# Patient Record
Sex: Female | Born: 1945
Health system: Southern US, Community
[De-identification: ages and names within clinical notes are randomized; demographics above are authoritative.]

## PROBLEM LIST (undated history)

## (undated) DIAGNOSIS — M1990 Unspecified osteoarthritis, unspecified site: Secondary | ICD-10-CM

## (undated) DIAGNOSIS — E669 Obesity, unspecified: Secondary | ICD-10-CM

## (undated) DIAGNOSIS — I10 Essential (primary) hypertension: Secondary | ICD-10-CM

## (undated) DIAGNOSIS — H409 Unspecified glaucoma: Secondary | ICD-10-CM

## (undated) DIAGNOSIS — F419 Anxiety disorder, unspecified: Secondary | ICD-10-CM

## (undated) DIAGNOSIS — H269 Unspecified cataract: Secondary | ICD-10-CM

## (undated) DIAGNOSIS — I872 Venous insufficiency (chronic) (peripheral): Secondary | ICD-10-CM

## (undated) DIAGNOSIS — F32A Depression, unspecified: Secondary | ICD-10-CM

## (undated) DIAGNOSIS — Z5189 Encounter for other specified aftercare: Secondary | ICD-10-CM

## (undated) DIAGNOSIS — F329 Major depressive disorder, single episode, unspecified: Secondary | ICD-10-CM

## (undated) DIAGNOSIS — T7840XA Allergy, unspecified, initial encounter: Secondary | ICD-10-CM

## (undated) DIAGNOSIS — G4730 Sleep apnea, unspecified: Secondary | ICD-10-CM

## (undated) DIAGNOSIS — K219 Gastro-esophageal reflux disease without esophagitis: Secondary | ICD-10-CM

## (undated) DIAGNOSIS — R269 Unspecified abnormalities of gait and mobility: Secondary | ICD-10-CM

## (undated) DIAGNOSIS — I890 Lymphedema, not elsewhere classified: Secondary | ICD-10-CM

## (undated) DIAGNOSIS — R32 Unspecified urinary incontinence: Secondary | ICD-10-CM

## (undated) DIAGNOSIS — M069 Rheumatoid arthritis, unspecified: Secondary | ICD-10-CM

## (undated) DIAGNOSIS — N6320 Unspecified lump in the left breast, unspecified quadrant: Secondary | ICD-10-CM

## (undated) HISTORY — DX: Allergy, unspecified, initial encounter: T78.40XA

## (undated) HISTORY — DX: Encounter for other specified aftercare: Z51.89

## (undated) HISTORY — PX: BREAST SURGERY: SHX581

## (undated) HISTORY — PX: VESICOVAGINAL FISTULA CLOSURE W/ TAH: SUR271

## (undated) HISTORY — DX: Lymphedema, not elsewhere classified: I89.0

## (undated) HISTORY — PX: TOTAL HIP ARTHROPLASTY: SHX124

## (undated) HISTORY — DX: Unspecified osteoarthritis, unspecified site: M19.90

## (undated) HISTORY — DX: Essential (primary) hypertension: I10

## (undated) HISTORY — PX: EYE SURGERY: SHX253

## (undated) HISTORY — DX: Unspecified cataract: H26.9

## (undated) HISTORY — DX: Unspecified lump in the left breast, unspecified quadrant: N63.20

## (undated) HISTORY — PX: JOINT REPLACEMENT: SHX530

## (undated) HISTORY — DX: Depression, unspecified: F32.A

## (undated) HISTORY — DX: Venous insufficiency (chronic) (peripheral): I87.2

## (undated) HISTORY — DX: Obesity, unspecified: E66.9

## (undated) HISTORY — PX: BREAST REDUCTION SURGERY: SHX8

## (undated) HISTORY — PX: OTHER SURGICAL HISTORY: SHX169

## (undated) HISTORY — DX: Gastro-esophageal reflux disease without esophagitis: K21.9

## (undated) HISTORY — DX: Unspecified abnormalities of gait and mobility: R26.9

## (undated) HISTORY — PX: BLADDER SURGERY: SHX569

## (undated) HISTORY — PX: ESOPHAGOGASTRODUODENOSCOPY: SHX1529

## (undated) HISTORY — DX: Unspecified glaucoma: H40.9

## (undated) HISTORY — PX: TOE SURGERY: SHX1073

## (undated) HISTORY — PX: ABDOMINAL HYSTERECTOMY: SHX81

## (undated) HISTORY — PX: TUBAL LIGATION: SHX77

## (undated) HISTORY — DX: Rheumatoid arthritis, unspecified: M06.9

## (undated) HISTORY — DX: Unspecified urinary incontinence: R32

## (undated) HISTORY — PX: COLONOSCOPY: SHX174

## (undated) HISTORY — PX: REPLACEMENT TOTAL KNEE: SUR1224

## (undated) HISTORY — DX: Major depressive disorder, single episode, unspecified: F32.9

---

## 1995-11-29 ENCOUNTER — Encounter: Payer: Self-pay | Admitting: Pulmonary Disease

## 1995-12-23 ENCOUNTER — Encounter: Payer: Self-pay | Admitting: Internal Medicine

## 2001-10-29 ENCOUNTER — Emergency Department (HOSPITAL_COMMUNITY): Admission: EM | Admit: 2001-10-29 | Discharge: 2001-10-29 | Payer: Self-pay | Admitting: Emergency Medicine

## 2003-04-01 ENCOUNTER — Encounter: Admission: RE | Admit: 2003-04-01 | Discharge: 2003-04-01 | Payer: Self-pay | Admitting: Family Medicine

## 2003-04-07 ENCOUNTER — Encounter: Admission: RE | Admit: 2003-04-07 | Discharge: 2003-04-07 | Payer: Self-pay | Admitting: Family Medicine

## 2003-04-22 ENCOUNTER — Encounter: Admission: RE | Admit: 2003-04-22 | Discharge: 2003-04-22 | Payer: Self-pay | Admitting: Internal Medicine

## 2004-02-11 ENCOUNTER — Ambulatory Visit: Payer: Self-pay | Admitting: Internal Medicine

## 2005-01-31 ENCOUNTER — Ambulatory Visit: Payer: Self-pay | Admitting: Family Medicine

## 2005-04-02 ENCOUNTER — Ambulatory Visit: Payer: Self-pay | Admitting: Family Medicine

## 2005-04-09 ENCOUNTER — Ambulatory Visit: Payer: Self-pay | Admitting: Family Medicine

## 2005-04-09 ENCOUNTER — Encounter: Admission: RE | Admit: 2005-04-09 | Discharge: 2005-04-09 | Payer: Self-pay | Admitting: Family Medicine

## 2006-01-22 ENCOUNTER — Ambulatory Visit: Payer: Self-pay | Admitting: Family Medicine

## 2006-04-11 ENCOUNTER — Ambulatory Visit: Payer: Self-pay | Admitting: Family Medicine

## 2006-04-11 LAB — CONVERTED CEMR LAB
AST: 17 units/L (ref 0–37)
Basophils Relative: 0.9 % (ref 0.0–1.0)
Bilirubin, Direct: 0.2 mg/dL (ref 0.0–0.3)
Direct LDL: 129.7 mg/dL
Eosinophils Relative: 5.5 % — ABNORMAL HIGH (ref 0.0–5.0)
HCT: 38.9 % (ref 36.0–46.0)
Hemoglobin: 13.3 g/dL (ref 12.0–15.0)
Monocytes Absolute: 0.5 10*3/uL (ref 0.2–0.7)
Neutrophils Relative %: 47.8 % (ref 43.0–77.0)
RDW: 12.5 % (ref 11.5–14.6)
Total Bilirubin: 1 mg/dL (ref 0.3–1.2)
Total CHOL/HDL Ratio: 4
VLDL: 27 mg/dL (ref 0–40)

## 2006-04-12 ENCOUNTER — Encounter: Payer: Self-pay | Admitting: Family Medicine

## 2006-04-18 ENCOUNTER — Ambulatory Visit: Payer: Self-pay | Admitting: Family Medicine

## 2006-05-22 ENCOUNTER — Ambulatory Visit: Payer: Self-pay | Admitting: Family Medicine

## 2006-08-26 DIAGNOSIS — I10 Essential (primary) hypertension: Secondary | ICD-10-CM | POA: Insufficient documentation

## 2006-08-26 DIAGNOSIS — F329 Major depressive disorder, single episode, unspecified: Secondary | ICD-10-CM

## 2006-08-26 DIAGNOSIS — M19041 Primary osteoarthritis, right hand: Secondary | ICD-10-CM | POA: Insufficient documentation

## 2006-08-26 DIAGNOSIS — R32 Unspecified urinary incontinence: Secondary | ICD-10-CM | POA: Insufficient documentation

## 2006-08-26 DIAGNOSIS — M19042 Primary osteoarthritis, left hand: Secondary | ICD-10-CM | POA: Insufficient documentation

## 2006-08-26 DIAGNOSIS — F32A Depression, unspecified: Secondary | ICD-10-CM | POA: Insufficient documentation

## 2006-09-04 ENCOUNTER — Encounter: Payer: Self-pay | Admitting: Family Medicine

## 2006-12-10 ENCOUNTER — Emergency Department (HOSPITAL_COMMUNITY): Admission: EM | Admit: 2006-12-10 | Discharge: 2006-12-10 | Payer: Self-pay | Admitting: Emergency Medicine

## 2007-02-06 HISTORY — PX: BLADDER SUSPENSION: SHX72

## 2007-05-09 ENCOUNTER — Telehealth (INDEPENDENT_AMBULATORY_CARE_PROVIDER_SITE_OTHER): Payer: Self-pay | Admitting: *Deleted

## 2007-06-24 ENCOUNTER — Telehealth: Payer: Self-pay | Admitting: *Deleted

## 2007-08-05 ENCOUNTER — Ambulatory Visit: Payer: Self-pay | Admitting: Family Medicine

## 2007-08-05 LAB — CONVERTED CEMR LAB
AST: 20 units/L (ref 0–37)
Alkaline Phosphatase: 87 units/L (ref 39–117)
BUN: 19 mg/dL (ref 6–23)
Basophils Absolute: 0 10*3/uL (ref 0.0–0.1)
Bilirubin, Direct: 0.1 mg/dL (ref 0.0–0.3)
Blood in Urine, dipstick: NEGATIVE
Chloride: 109 meq/L (ref 96–112)
Eosinophils Absolute: 0.3 10*3/uL (ref 0.0–0.7)
Eosinophils Relative: 5.7 % — ABNORMAL HIGH (ref 0.0–5.0)
GFR calc non Af Amer: 60 mL/min
HDL: 54.3 mg/dL (ref >39.0)
MCV: 89.1 fL (ref 78.0–100.0)
Neutrophils Relative %: 54.1 % (ref 43.0–77.0)
Nitrite: NEGATIVE
Platelets: 248 10*3/uL (ref 150–400)
Potassium: 4 meq/L (ref 3.5–5.1)
Protein, U semiquant: NEGATIVE
RDW: 12.2 % (ref 11.5–14.6)
Sodium: 146 meq/L — ABNORMAL HIGH (ref 135–145)
Total Bilirubin: 0.8 mg/dL (ref 0.3–1.2)
Total CHOL/HDL Ratio: 3.4
VLDL: 13 mg/dL (ref 0–40)
WBC Urine, dipstick: NEGATIVE
WBC: 6.1 10*3/uL (ref 4.5–10.5)
pH: 5.5

## 2007-08-14 ENCOUNTER — Ambulatory Visit: Payer: Self-pay | Admitting: Family Medicine

## 2007-08-14 DIAGNOSIS — R269 Unspecified abnormalities of gait and mobility: Secondary | ICD-10-CM | POA: Insufficient documentation

## 2007-08-14 DIAGNOSIS — G4730 Sleep apnea, unspecified: Secondary | ICD-10-CM | POA: Insufficient documentation

## 2007-08-29 ENCOUNTER — Ambulatory Visit: Payer: Self-pay | Admitting: Pulmonary Disease

## 2007-08-29 DIAGNOSIS — G4733 Obstructive sleep apnea (adult) (pediatric): Secondary | ICD-10-CM | POA: Insufficient documentation

## 2007-09-03 ENCOUNTER — Encounter: Payer: Self-pay | Admitting: Pulmonary Disease

## 2007-09-03 ENCOUNTER — Ambulatory Visit (HOSPITAL_BASED_OUTPATIENT_CLINIC_OR_DEPARTMENT_OTHER): Admission: RE | Admit: 2007-09-03 | Discharge: 2007-09-03 | Payer: Self-pay | Admitting: Pulmonary Disease

## 2007-09-16 ENCOUNTER — Ambulatory Visit: Payer: Self-pay | Admitting: Pulmonary Disease

## 2007-09-17 ENCOUNTER — Telehealth (INDEPENDENT_AMBULATORY_CARE_PROVIDER_SITE_OTHER): Payer: Self-pay | Admitting: *Deleted

## 2007-09-18 ENCOUNTER — Ambulatory Visit: Payer: Self-pay | Admitting: Pulmonary Disease

## 2007-10-16 ENCOUNTER — Ambulatory Visit: Payer: Self-pay | Admitting: Pulmonary Disease

## 2007-10-29 ENCOUNTER — Telehealth: Payer: Self-pay | Admitting: Family Medicine

## 2007-11-05 ENCOUNTER — Encounter: Payer: Self-pay | Admitting: Pulmonary Disease

## 2007-11-13 ENCOUNTER — Ambulatory Visit (HOSPITAL_COMMUNITY): Admission: RE | Admit: 2007-11-13 | Discharge: 2007-11-14 | Payer: Self-pay | Admitting: Urology

## 2007-12-05 ENCOUNTER — Ambulatory Visit: Payer: Self-pay | Admitting: Family Medicine

## 2008-01-12 DIAGNOSIS — M25579 Pain in unspecified ankle and joints of unspecified foot: Secondary | ICD-10-CM | POA: Insufficient documentation

## 2008-01-19 ENCOUNTER — Ambulatory Visit: Payer: Self-pay | Admitting: Family Medicine

## 2008-01-19 LAB — CONVERTED CEMR LAB
BUN: 14 mg/dL (ref 6–23)
CO2: 30 meq/L (ref 19–32)
Chloride: 108 meq/L (ref 96–112)
Creatinine, Ser: 0.9 mg/dL (ref 0.4–1.2)
Glucose, Bld: 87 mg/dL (ref 70–99)
Potassium: 4.1 meq/L (ref 3.5–5.1)

## 2008-01-26 ENCOUNTER — Telehealth: Payer: Self-pay | Admitting: Family Medicine

## 2008-02-05 ENCOUNTER — Encounter: Admission: RE | Admit: 2008-02-05 | Discharge: 2008-02-05 | Payer: Self-pay | Admitting: Orthopedic Surgery

## 2008-04-14 ENCOUNTER — Ambulatory Visit: Payer: Self-pay | Admitting: Pulmonary Disease

## 2008-04-21 ENCOUNTER — Encounter (INDEPENDENT_AMBULATORY_CARE_PROVIDER_SITE_OTHER): Payer: Self-pay | Admitting: *Deleted

## 2008-05-24 ENCOUNTER — Ambulatory Visit: Payer: Self-pay | Admitting: Internal Medicine

## 2008-06-07 ENCOUNTER — Ambulatory Visit: Payer: Self-pay | Admitting: Internal Medicine

## 2008-06-16 ENCOUNTER — Encounter: Payer: Self-pay | Admitting: Family Medicine

## 2008-06-29 ENCOUNTER — Telehealth: Payer: Self-pay | Admitting: Family Medicine

## 2008-06-30 ENCOUNTER — Ambulatory Visit: Payer: Self-pay | Admitting: Family Medicine

## 2008-08-13 ENCOUNTER — Ambulatory Visit: Payer: Self-pay | Admitting: Family Medicine

## 2008-08-13 LAB — CONVERTED CEMR LAB
Alkaline Phosphatase: 83 units/L (ref 39–117)
BUN: 10 mg/dL (ref 6–23)
Basophils Absolute: 0 10*3/uL (ref 0.0–0.1)
Basophils Relative: 0.8 % (ref 0.0–3.0)
Bilirubin, Direct: 0 mg/dL (ref 0.0–0.3)
Blood in Urine, dipstick: NEGATIVE
CO2: 30 meq/L (ref 19–32)
Calcium: 9.5 mg/dL (ref 8.4–10.5)
Chloride: 106 meq/L (ref 96–112)
Cholesterol: 180 mg/dL (ref 0–200)
Creatinine, Ser: 0.9 mg/dL (ref 0.4–1.2)
Eosinophils Absolute: 0.3 10*3/uL (ref 0.0–0.7)
HDL: 54.9 mg/dL (ref >39.00)
Ketones, urine, test strip: NEGATIVE
LDL Cholesterol: 106 mg/dL — ABNORMAL HIGH (ref 0–99)
Lymphocytes Relative: 37 % (ref 12.0–46.0)
MCHC: 34.1 g/dL (ref 30.0–36.0)
MCV: 88.2 fL (ref 78.0–100.0)
Monocytes Absolute: 0.4 10*3/uL (ref 0.1–1.0)
Neutrophils Relative %: 47.6 % (ref 43.0–77.0)
Nitrite: NEGATIVE
Platelets: 226 10*3/uL (ref 150.0–400.0)
RBC: 4.27 M/uL (ref 3.87–5.11)
Total Bilirubin: 1.1 mg/dL (ref 0.3–1.2)
Total CHOL/HDL Ratio: 3
Triglycerides: 95 mg/dL (ref 0.0–149.0)
Urobilinogen, UA: 0.2
WBC: 4.6 10*3/uL (ref 4.5–10.5)

## 2008-08-19 ENCOUNTER — Encounter: Payer: Self-pay | Admitting: Family Medicine

## 2008-08-20 ENCOUNTER — Ambulatory Visit: Payer: Self-pay | Admitting: Family Medicine

## 2008-10-21 ENCOUNTER — Encounter: Payer: Self-pay | Admitting: *Deleted

## 2008-12-04 ENCOUNTER — Emergency Department (HOSPITAL_COMMUNITY): Admission: EM | Admit: 2008-12-04 | Discharge: 2008-12-04 | Payer: Self-pay | Admitting: Emergency Medicine

## 2009-04-14 ENCOUNTER — Ambulatory Visit: Payer: Self-pay | Admitting: Pulmonary Disease

## 2009-05-09 ENCOUNTER — Encounter: Payer: Self-pay | Admitting: Pulmonary Disease

## 2009-07-28 ENCOUNTER — Ambulatory Visit: Payer: Self-pay | Admitting: Family Medicine

## 2009-07-28 DIAGNOSIS — I872 Venous insufficiency (chronic) (peripheral): Secondary | ICD-10-CM | POA: Insufficient documentation

## 2009-07-28 DIAGNOSIS — N643 Galactorrhea not associated with childbirth: Secondary | ICD-10-CM | POA: Insufficient documentation

## 2009-08-01 ENCOUNTER — Encounter: Payer: Self-pay | Admitting: Family Medicine

## 2009-08-03 ENCOUNTER — Encounter: Payer: Self-pay | Admitting: Family Medicine

## 2009-08-28 ENCOUNTER — Ambulatory Visit (HOSPITAL_COMMUNITY): Admission: EM | Admit: 2009-08-28 | Discharge: 2009-08-28 | Payer: Self-pay | Admitting: Emergency Medicine

## 2009-08-28 ENCOUNTER — Encounter: Payer: Self-pay | Admitting: Family Medicine

## 2009-08-28 ENCOUNTER — Ambulatory Visit: Payer: Self-pay | Admitting: Vascular Surgery

## 2009-08-30 ENCOUNTER — Telehealth: Payer: Self-pay | Admitting: Internal Medicine

## 2009-09-05 ENCOUNTER — Encounter: Payer: Self-pay | Admitting: Family Medicine

## 2009-09-05 ENCOUNTER — Ambulatory Visit: Payer: Self-pay | Admitting: Surgery

## 2009-10-04 ENCOUNTER — Encounter: Payer: Self-pay | Admitting: Family Medicine

## 2009-11-14 ENCOUNTER — Telehealth: Payer: Self-pay | Admitting: Family Medicine

## 2009-11-24 ENCOUNTER — Encounter: Payer: Self-pay | Admitting: Family Medicine

## 2009-12-08 ENCOUNTER — Encounter: Payer: Self-pay | Admitting: Family Medicine

## 2009-12-08 ENCOUNTER — Ambulatory Visit: Payer: Self-pay | Admitting: Family Medicine

## 2009-12-08 DIAGNOSIS — E669 Obesity, unspecified: Secondary | ICD-10-CM | POA: Insufficient documentation

## 2009-12-13 LAB — CONVERTED CEMR LAB
AST: 21 units/L (ref 0–37)
Albumin: 3.9 g/dL (ref 3.5–5.2)
BUN: 14 mg/dL (ref 6–23)
Basophils Absolute: 0.1 10*3/uL (ref 0.0–0.1)
CO2: 29 meq/L (ref 19–32)
Chloride: 107 meq/L (ref 96–112)
GFR calc non Af Amer: 88.41 mL/min (ref >60)
Glucose, Bld: 80 mg/dL (ref 70–99)
HCT: 36.3 % (ref 36.0–46.0)
Hemoglobin: 12.6 g/dL (ref 12.0–15.0)
Lymphs Abs: 1.8 10*3/uL (ref 0.7–4.0)
MCHC: 34.7 g/dL (ref 30.0–36.0)
MCV: 88.4 fL (ref 78.0–100.0)
Monocytes Absolute: 0.4 10*3/uL (ref 0.1–1.0)
Monocytes Relative: 6.6 % (ref 3.0–12.0)
Neutro Abs: 2.9 10*3/uL (ref 1.4–7.7)
Platelets: 254 10*3/uL (ref 150.0–400.0)
Potassium: 4 meq/L (ref 3.5–5.1)
RDW: 12.9 % (ref 11.5–14.6)
Sodium: 142 meq/L (ref 135–145)
TSH: 1.69 microintl units/mL (ref 0.35–5.50)

## 2010-03-07 NOTE — Letter (Signed)
Summary: External Correspondence  External Correspondence   Imported By: Valinda Hoar 05/09/2009 15:41:11  _____________________________________________________________________  External Attachment:    Type:   Image     Comment:   External Document

## 2010-03-07 NOTE — Assessment & Plan Note (Signed)
Summary: swelling feet/soreness in breast/cjr   Vital Signs:  Patient profile:   65 year old female Menstrual status:  hysterectomy Weight:      262 pounds Temp:     97.8 degrees F oral BP sitting:   122 / 82  (left arm) Cuff size:   regular  Vitals Entered By: Kern Reap CMA Duncan Dull) (July 28, 2009 12:29 PM) CC: feet and leg swelling   Primary Care Provider:  Alonza Smoker  CC:  feet and leg swelling.  History of Present Illness: Taylor Mccall is a 65 year old female, who comes in today for evaluation of two problems.  Her venous insufficiency is getting worse.  She's tried the full-length stockings, but she finds them uncomfortable hot and extensive.  She takes Lasix 20 mg daily.  Her podiatrist feels she needs a foot operation.  However, they don't want to operate on her foot because of the edema.  Two weeks ago she noticed some itching around her left nipple and then a week ago started having pain behind the left nipple.  Two days ago she noticed a clear discharge.  No blood.  She has had a reduction mammoplasty  Allergies: 1)  Penicillin G Potassium (Penicillin G Potassium)  Past History:  Past medical, surgical, family and social histories (including risk factors) reviewed for relevance to current acute and chronic problems.  Past Medical History: Reviewed history from 08/20/2008 and no changes required. Obesity Cystic lesion-mastitis  GAIT DISTURBANCE (ICD-781.2) FAMILY HISTORY DIABETES 1ST DEGREE RELATIVE (ICD-V18.0) URINARY INCONTINENCE (ICD-788.30) OSTEOARTHRITIS (ICD-715.90) HYPERTENSION (ICD-401.9) DEPRESSION (ICD-311)  bladder suspension09  rheumatoid arthritis  Past Surgical History: Reviewed history from 08/26/2006 and no changes required. Lt Pros Hip Rt Pin CB x 2  Family History: Reviewed history from 08/29/2007 and no changes required. Family History Diabetes 1st degree relative Family History of Stroke M 1st degree relative <50   asthma:  brother heart disease: brother, father cancer: father (skin)   Social History: Reviewed history from 08/29/2007 and no changes required. Patient never smoked.  pt is widowed. pt has children.  Review of Systems      See HPI  Physical Exam  General:  Well-developed,well-nourished,in no acute distress; alert,appropriate and cooperative throughout examination Breasts:  scars from previous reduction mammoplasty also diffuse fibrocystic changes from previous surgery.  No obvious lesions. Extremities:  1+ left pedal edema and 1+ right pedal edema.     Problems:  Medical Problems Added: 1)  Dx of Venous Insufficiency, Chronic  (ICD-459.81) 2)  Dx of Galactorrhea  (ICD-611.6)  Impression & Recommendations:  Problem # 1:  GALACTORRHEA (ICD-611.6) Assessment New  Orders: Radiology Referral (Radiology)  Problem # 2:  VENOUS INSUFFICIENCY, CHRONIC (ICD-459.81) Assessment: Deteriorated  Orders: Misc. Referral (Misc. Ref)  Complete Medication List: 1)  Furosemide 20 Mg Tabs (Furosemide) .... Take 1 tablet by mouth two times a day 2)  Zoloft 100 Mg Tabs (Sertraline hcl) .... Take 1 tablet by mouth once a day 3)  Potassium Chloride Crys Cr 20 Meq Cr-tabs (Potassium chloride crys cr) .... Take one tab once daily 4)  Aspirin Low Dose 81 Mg Tabs (Aspirin) .... Take 1 tablet by mouth once a day 5)  Hydroxychloroquine Sulfate 200 Mg Tabs (Hydroxychloroquine sulfate) .... Take 1 tablet by mouth two times a day 6)  D3-50 50000 Unit Caps (Cholecalciferol) .... Take one tab once daily 7)  Calcium-vitamin D 600-200 Mg-unit Tabs (Calcium-vitamin d) .... Take 1 tablet by mouth once a day 8)  Fabb Tabs 200mg   .Marland KitchenMarland KitchenMarland Kitchen  Take 1 tablet by mouth once a day 9)  Vitamin E 400 Unit Caps (Vitamin e) .... Take 1 tablet by mouth once a day  Patient Instructions: 1)  double the Lasix does take one in the morning and a second dose at noon.  We will call Dr. Betti Cruz today to set up for a consult. 2)  We will  also get u  set up at the breast Center for evaluation of the pain  &  discharge in her left breast Prescriptions: FUROSEMIDE 20 MG TABS (FUROSEMIDE) Take 1 tablet by mouth two times a day  #200 x 3   Entered and Authorized by:   Roderick Pee MD   Signed by:   Roderick Pee MD on 07/28/2009   Method used:   [facility] to        [facility]  [address] [phone]* (retail)       [address]       [place], [place]  [address]       Ph: [phone] or [phone]       Fax: [phone]   RxID:   [id number]

## 2010-03-07 NOTE — Miscellaneous (Signed)
Summary: mammogram update  Clinical Lists Changes  Observations: Added new observation of MAMMO DUE: 12/2010 (11/24/2009 9:00) Added new observation of MAMMOGRAM: normal (11/23/2009 9:00)      Preventive Care Screening  Mammogram:    Date:  11/23/2009    Next Due:  12/2010    Results:  normal

## 2010-03-07 NOTE — Assessment & Plan Note (Signed)
Summary: rov for osa   Copy to:  Victorino Dike Primary Provider/Referring Provider:  Alonza Smoker  CC:  Pt is here for a 1 yr f/u appt.   Pt states she is using her cpap machine every night.  Approx 8 hours per night.  Pt denied any complaints with pressure. Pt does c/o mask not fitting properly across bridge of nose. Marland Kitchen  History of Present Illness: The pt comes in today for f/u of her osa.  She is wearing her device complaintly, and has no issues with the pressure.  She does have issues with mask fit and leaks that we will have to troubleshoot.  She is sleeping fairly well, but not as good as in past.  She may be losing pressure with her mask leaks that may be responsible for this.  Current Medications (verified): 1)  Furosemide 20 Mg Tabs (Furosemide) .... Take 1 Tablet By Mouth Once A Day 2)  Zoloft 100 Mg Tabs (Sertraline Hcl) .... Take 1 Tablet By Mouth Once A Day 3)  Potassium Chloride Crys Cr 20 Meq Cr-Tabs (Potassium Chloride Crys Cr) .... Take One Tab Once Daily 4)  Aspirin Low Dose 81 Mg Tabs (Aspirin) .... Take 1 Tablet By Mouth Once A Day 5)  Hydroxychloroquine Sulfate 200 Mg Tabs (Hydroxychloroquine Sulfate) .... Take 1 Tablet By Mouth Two Times A Day 6)  D3-50 50000 Unit Caps (Cholecalciferol) .... Take One Tab Once Daily 7)  Calcium-Vitamin D 600-200 Mg-Unit Tabs (Calcium-Vitamin D) .... Take 1 Tablet By Mouth Once A Day 8)  Fabb Tabs 200mg  .... Take 1 Tablet By Mouth Once A Day 9)  Vitamin E 400 Unit Caps (Vitamin E) .... Take 1 Tablet By Mouth Once A Day  Allergies (verified): 1)  Penicillin G Potassium (Penicillin G Potassium)  Review of Systems      See HPI  Vital Signs:  Patient profile:   65 year old female Menstrual status:  hysterectomy Height:      70 inches Weight:      265.50 pounds BMI:     38.23 O2 Sat:      95 % on Room air Temp:     98.3 degrees F oral Pulse rate:   83 / minute BP sitting:   124 / 72  (left arm) Cuff size:   large  Vitals Entered By:  Arman Filter LPN (April 14, 2009 10:07 AM)  O2 Flow:  Room air CC: Pt is here for a 1 yr f/u appt.   Pt states she is using her cpap machine every night.  Approx 8 hours per night.  Pt denied any complaints with pressure. Pt does c/o mask not fitting properly across bridge of nose.  Comments Medications reviewed with patient Arman Filter LPN  April 14, 2009 10:07 AM    Physical Exam  General:  obese female in nad Nose:  no skin breakdown or pressure necrosis from cpap mask   Impression & Recommendations:  Problem # 1:  OBSTRUCTIVE SLEEP APNEA (ICD-327.23) the pt is doing well with cpap other than mask leaks.  Will work on better fit, and I have encouraged her to work on weight loss.  Medications Added to Medication List This Visit: 1)  Zoloft 100 Mg Tabs (Sertraline hcl) .... Take 1 tablet by mouth once a day 2)  Aspirin Low Dose 81 Mg Tabs (Aspirin) .... Take 1 tablet by mouth once a day 3)  Hydroxychloroquine Sulfate 200 Mg Tabs (Hydroxychloroquine sulfate) .... Take 1 tablet by  mouth two times a day 4)  Calcium-vitamin D 600-200 Mg-unit Tabs (Calcium-vitamin d) .... Take 1 tablet by mouth once a day 5)  Fabb Tabs 200mg   .... Take 1 tablet by mouth once a day 6)  Vitamin E 400 Unit Caps (Vitamin e) .... Take 1 tablet by mouth once a day  Other Orders: Est. Patient Level II (60454) DME Referral (DME)  Patient Instructions: 1)  will have advanced work on mask fit with you.  If no success, can send you back to sleep center for fitting. 2)  if you feel that you are not sleeping as well, let me know, and we can recheck your pressure at home. 3)  work on weight loss. 4)  followup with me in 1year, or sooner if problems.

## 2010-03-07 NOTE — Assessment & Plan Note (Signed)
Summary: emp//pt will be fasting//lch   Vital Signs:  Patient profile:   65 year old female Menstrual status:  hysterectomy Height:      69 inches Weight:      263 pounds Temp:     98.8 degrees F oral BP sitting:   124 / 90  (left arm) Cuff size:   regular  Vitals Entered By: Kern Reap CMA Duncan Dull) (December 08, 2009 10:38 AM) CC: wellness exam Is Patient Diabetic? No   Primary Care Provider:  Alonza Smoker  CC:  wellness exam.  History of Present Illness: Taylor Mccall is a 65 year old, married female, nonsmoker, who comes in today for Medicare wellness examination.  She has a history of obesity.  Weight 263 pounds, venous insufficiency on Lasix 20 mg b.i.d., potassium 20 mEq daily.  All her problems will be markedly diminished, including her severe arthritis, for which she is now seeing the rheumatologist about it.  She would lose weight, however, she's not been able to do this.  She is on Zoloft 100 mg nightly for depression.  She takes 81 mg baby aspirin chloroquine 400 mg daily via rheumatology.  She used.  Routine eye care, dental care, and your mammography, colonoscopy, normal, tetanus, 2007, Pneumovax 2010, seasonal flu shot today, information given on shingles.  Here for Medicare AWV:  1.   Risk factors based on Past M, S, F history:...reviewed no changes except for weight still up, and arthritis bothering her a lot 2.   Physical Activities: ...unable to walk because of deformities of her feet from previous surgery 3.   Depression/mood: ......good mood medication helps depression 4.   Hearing: .......normal 5.   ADL's: functions independently 6.   Fall Risk: reviewed and identified 7.   Home Safety: reviewed in house 8.   Height, weight, &visual acuity:height weight and change.  Vision normal.  Currently going to see Dr. Luciana Axe because of macular degeneration in her left eye 9.   Counseling: diet, exercise, weight loss 10.   Labs ordered based on risk factors: done  today 11.           Referral Coordination...Marland KitchenMarland KitchenMarland Kitchennone indicated 12.           Care Plan...Marland KitchenMarland KitchenMarland Kitchenreviewed all medications 13.            Cognitive Assessment .Marland Kitchen...oriented x 3, and does her own financial calculations, independently  Allergies: 1)  Penicillin G Potassium (Penicillin G Potassium)  Past History:  Past medical, surgical, family and social histories (including risk factors) reviewed, and no changes noted (except as noted below).  Past Medical History: Reviewed history from 08/20/2008 and no changes required. Obesity Cystic lesion-mastitis  GAIT DISTURBANCE (ICD-781.2) FAMILY HISTORY DIABETES 1ST DEGREE RELATIVE (ICD-V18.0) URINARY INCONTINENCE (ICD-788.30) OSTEOARTHRITIS (ICD-715.90) HYPERTENSION (ICD-401.9) DEPRESSION (ICD-311)  bladder suspension09  rheumatoid arthritis  Past Surgical History: Reviewed history from 08/26/2006 and no changes required. Lt Pros Hip Rt Pin CB x 2  Family History: Reviewed history from 08/29/2007 and no changes required. Family History Diabetes 1st degree relative Family History of Stroke M 1st degree relative <50   asthma: brother heart disease: brother, father cancer: father (skin)   Social History: Reviewed history from 08/29/2007 and no changes required. Patient never smoked.  pt is widowed. pt has children.  Review of Systems      See HPI       Flu Vaccine Consent Questions     Do you have a history of severe allergic reactions to this vaccine? no  Any prior history of allergic reactions to egg and/or gelatin? no    Do you have a sensitivity to the preservative Thimersol? no    Do you have a past history of Guillan-Barre Syndrome? no    Do you currently have an acute febrile illness? no    Have you ever had a severe reaction to latex? no    Vaccine information given and explained to patient? yes    Are you currently pregnant? no    Lot Number:[id number]   Exp Date:08/05/2010   Site Given  Left Deltoid  IM   Physical Exam  General:  Well-developed,well-nourished,in no acute distress; alert,appropriate and cooperative throughout examination Head:  Normocephalic and atraumatic without obvious abnormalities. No apparent alopecia or balding. Eyes:  No corneal or conjunctival inflammation noted. EOMI. Perrla. Funduscopic exam benign, without hemorrhages, exudates or papilledema. Vision grossly normal. Ears:  External ear exam shows no significant lesions or deformities.  Otoscopic examination reveals clear canals, tympanic membranes are intact bilaterally without bulging, retraction, inflammation or discharge. Hearing is grossly normal bilaterally. Nose:  External nasal examination shows no deformity or inflammation. Nasal mucosa are pink and moist without lesions or exudates. Mouth:  Oral mucosa and oropharynx without lesions or exudates.  Teeth in good repair. Neck:  No deformities, masses, or tenderness noted. Chest Wall:  No deformities, masses, or tenderness noted. Breasts:  No mass, nodules, thickening, tenderness, bulging, retraction, inflamation, nipple discharge or skin changes noted.  ....Marland Kitchenscars from reduction mammoplasty Lungs:  Normal respiratory effort, chest expands symmetrically. Lungs are clear to auscultation, no crackles or wheezes. Heart:  Normal rate and regular rhythm. S1 and S2 normal without gallop, murmur, click, rub or other extra sounds. Abdomen:  Bowel sounds positive,abdomen soft and non-tender without masses, organomegaly or hernias noted. Rectal:  No external abnormalities noted. Normal sphincter tone. No rectal masses or tenderness. Genitalia:  Pelvic Exam:        External: normal female genitalia without lesions or masses        Vagina: normal without lesions or masses        Cervix: normal without lesions or masses        Adnexa: normal bimanual exam without masses or fullness        Uterus: normal by palpation        Pap smear: not performed Msk:  No deformity  or scoliosis noted of thoracic or lumbar spine.   Pulses:  R and L carotid,radial,femoral,dorsalis pedis and posterior tibial pulses are full and equal bilaterally Extremities:  No clubbing, cyanosis, edema, or deformity noted with normal full range of motion of all joints.   Neurologic:  No cranial nerve deficits noted. Station and gait are normal. Plantar reflexes are down-going bilaterally. DTRs are symmetrical throughout. Sensory, motor and coordinative functions appear intact. Skin:  Intact without suspicious lesions or rashes Cervical Nodes:  No lymphadenopathy noted Axillary Nodes:  No palpable lymphadenopathy Inguinal Nodes:  No significant adenopathy Psych:  Cognition and judgment appear intact. Alert and cooperative with normal attention span and concentration. No apparent delusions, illusions, hallucinations   Impression & Recommendations:  Problem # 1:  VENOUS INSUFFICIENCY, CHRONIC (ICD-459.81) Assessment Unchanged  Orders: Venipuncture (16109) Prescription Created Electronically [phone]) Medicare -1st Annual Wellness Visit [phone]) Urinalysis-dipstick only (Medicare patient) (91478GN) TLB-BMP (Basic Metabolic Panel-BMET) (80048-METABOL) TLB-CBC Platelet - w/Differential (85025-CBCD) TLB-Hepatic/Liver Function Pnl (80076-HEPATIC) TLB-TSH (Thyroid Stimulating Hormone) (84443-TSH)  Problem # 2:  DEPRESSION (ICD-311) Assessment: Improved  Her updated medication list for this problem includes:  Zoloft 100 Mg Tabs (Sertraline hcl) .Marland Kitchen... Take 1 tablet by mouth once a day  Orders: Venipuncture (09811) Prescription Created Electronically [phone]) Medicare -1st Annual Wellness Visit [phone]) Urinalysis-dipstick only (Medicare patient) (13086VH) TLB-BMP (Basic Metabolic Panel-BMET) (80048-METABOL) TLB-CBC Platelet - w/Differential (85025-CBCD) TLB-Hepatic/Liver Function Pnl (80076-HEPATIC) TLB-TSH (Thyroid Stimulating Hormone) (84443-TSH)  Problem # 3:  Preventive Health  Care (ICD-V70.0) Assessment: Unchanged  Problem # 4:  OBESITY, UNSPECIFIED (ICD-278.00) Assessment: Unchanged  Orders: Venipuncture (84696) Prescription Created Electronically [phone]) Medicare -1st Annual Wellness Visit [phone]) Urinalysis-dipstick only (Medicare patient) (40102VO) TLB-BMP (Basic Metabolic Panel-BMET) (80048-METABOL) TLB-CBC Platelet - w/Differential (85025-CBCD) TLB-Hepatic/Liver Function Pnl (80076-HEPATIC) TLB-TSH (Thyroid Stimulating Hormone) (84443-TSH)  Complete Medication List: 1)  Furosemide 20 Mg Tabs (Furosemide) .... Take 1 tablet by mouth two times a day 2)  Zoloft 100 Mg Tabs (Sertraline hcl) .... Take 1 tablet by mouth once a day 3)  Potassium Chloride Crys Cr 20 Meq Cr-tabs (Potassium chloride crys cr) .... Take one tab once daily 4)  Aspirin Low Dose 81 Mg Tabs (Aspirin) .... Take 1 tablet by mouth once a day 5)  Hydroxychloroquine Sulfate 200 Mg Tabs (Hydroxychloroquine sulfate) .... Take 1 tablet by mouth two times a day 6)  D3-50 50000 Unit Caps (Cholecalciferol) .... Take one tab once daily 7)  Calcium-vitamin D 600-200 Mg-unit Tabs (Calcium-vitamin d) .... Take 1 tablet by mouth once a day 8)  Fabb Tabs 200mg   .... Take 1 tablet by mouth once a day 9)  Vitamin E 400 Unit Caps (Vitamin e) .... Take 1 tablet by mouth once a day 10)  Fish Oil Oil (Fish oil) .... Take one tab once daily  Other Orders: Flu Vaccine 7yrs + MEDICARE PATIENTS (Z3664) Administration Flu vaccine - MCR (G0008) EKG w/ Interpretation (93000)  Patient Instructions: 1)  Please schedule a follow-up appointment in 1 year. 2)  It is important that you exercise regularly at least 20 minutes 5 times a week. If you develop chest pain, have severe difficulty breathing, or feel very tired , stop exercising immediately and seek medical attention. 3)  You need to lose weight. Consider a lower calorie diet and regular exercise.  4)  Schedule your mammogram. 5)  Schedule a  colonoscopy/sigmoidoscopy to help detect colon cancer. 6)  Take calcium +Vitamin D daily. 7)  Take an Aspirin every day. Prescriptions: POTASSIUM CHLORIDE CRYS CR 20 MEQ CR-TABS (POTASSIUM CHLORIDE CRYS CR) take one tab once daily  #100 x 3   Entered and Authorized by:   Roderick Pee MD   Signed by:   Roderick Pee MD on 12/08/2009   Method used:   Faxed to ...       [facility] (mail-order)             , [place]         Ph: [phone]       Fax: [phone]   RxID:   [id number] ZOLOFT 100 MG TABS (SERTRALINE HCL) Take 1 tablet by mouth once a day  #100 Tablet x 3   Entered and Authorized by:   Roderick Pee MD   Signed by:   Roderick Pee MD on 12/08/2009   Method used:   Faxed to ...       [facility] (mail-order)             , [place]         Ph: [phone]       Fax: [phone]   RxID:  [id number] FUROSEMIDE 20 MG TABS (FUROSEMIDE) Take 1 tablet by mouth two times a day  #200 x 3   Entered and Authorized by:   Roderick Pee MD   Signed by:   Roderick Pee MD on 12/08/2009   Method used:   Faxed to ...       [facility] (mail-order)             , [place]         Ph: [phone]       Fax: [phone]   RxID:   [id number]    Orders Added: 1)  Venipuncture [61607] 2)  Prescription Created Electronically [phone] 3)  Medicare -1st Annual Wellness Visit [G0438] 4)  Urinalysis-dipstick only (Medicare patient) [81003QW] 5)  Flu Vaccine 25yrs + MEDICARE PATIENTS [Q2039] 6)  Administration Flu vaccine - MCR [G0008] 7)  EKG w/ Interpretation [93000] 8)  TLB-BMP (Basic Metabolic Panel-BMET) [80048-METABOL] 9)  TLB-CBC Platelet - w/Differential [85025-CBCD] 10)  TLB-Hepatic/Liver Function Pnl [80076-HEPATIC] 11)  TLB-TSH (Thyroid Stimulating Hormone) [26948-NIO]

## 2010-03-07 NOTE — Miscellaneous (Signed)
Summary: mammogram update  Clinical Lists Changes  Observations: Added new observation of MAMMO DUE: 11/2009 (08/03/2009 11:52) Added new observation of MAMMOGRAM: BI-RADS 3: probable benign finding (08/01/2009 11:53)      Preventive Care Screening  Mammogram:    Date:  08/01/2009    Next Due:  11/2009    Results:  BI-RADS 3: probable benign finding

## 2010-03-07 NOTE — Progress Notes (Signed)
Summary: Leg Pain  Phone Note Call from Patient Call back at Home Phone [phone]   Caller: Patient Summary of Call: Still having left leg pain.  Should she see Dr. Tawanna Cooler or go to rhuematologist?  Also wants to know if Dr. Dallas Schimke sent records to Dr. Tawanna Cooler? Initial call taken by: Trixie Dredge,  August 30, 2009 2:44 PM  Follow-up for Phone Call        patien was referred to Vascular (Dr Hart Rochester) for venous insufficiency- please schedule Follow-up by: Gordy Savers  MD,  August 30, 2009 3:01 PM  Additional Follow-up for Phone Call Additional follow up Details #1::        patient has appointment scheduled and will call back if needed. Additional Follow-up by: Kern Reap CMA Duncan Dull),  August 30, 2009 3:12 PM

## 2010-03-07 NOTE — Progress Notes (Signed)
Summary: Refill Request  Phone Note Refill Request Message from:  Patient on November 14, 2009 1:57 PM  Refills Requested: Medication #1:  FUROSEMIDE 20 MG TABS Take 1 tablet by mouth two times a day   Dosage confirmed as above?Dosage Confirmed   Supply Requested: 1 month  Medication #2:  POTASSIUM CHLORIDE CRYS CR 20 MEQ CR-TABS take one tab once daily   Dosage confirmed as above?Dosage Confirmed   [facility] Requested[facility] [address]   Next Appointment Scheduled: none Initial call taken by: Harold Barban,  November 14, 2009 1:57 PM    Prescriptions: POTASSIUM CHLORIDE CRYS CR 20 MEQ CR-TABS (POTASSIUM CHLORIDE CRYS CR) take one tab once daily  #100 x 3   Entered by:   Lynann Beaver CMA   Authorized by:   Roderick Pee MD   Signed by:   Lynann Beaver CMA on 11/14/2009   Method used:   [facility]        [facility]  [address] [phone]* (retail)       [address]       [place], [place]  [id number]       Ph: [phone] or [phone]       Fax: [phone]   RxID:   [id number] FUROSEMIDE 20 MG TABS (FUROSEMIDE) Take 1 tablet by mouth two times a day  #200 x 3   Entered by:   Lynann Beaver CMA   Authorized by:   Roderick Pee MD   Signed by:   Lynann Beaver CMA on 11/14/2009   Method used:   [facility]        [facility]  [address] [phone]* (retail)       [address]       [place], [place]  [id number]       Ph: [phone] or [phone]       Fax: [phone]   RxID:   [id number]

## 2010-04-06 ENCOUNTER — Encounter: Payer: Self-pay | Admitting: Family Medicine

## 2010-04-06 ENCOUNTER — Ambulatory Visit (INDEPENDENT_AMBULATORY_CARE_PROVIDER_SITE_OTHER): Payer: No Typology Code available for payment source | Admitting: Family Medicine

## 2010-04-06 VITALS — BP 140/96 | Temp 98.6°F | Ht 69.0 in | Wt 260.0 lb

## 2010-04-06 DIAGNOSIS — M1710 Unilateral primary osteoarthritis, unspecified knee: Secondary | ICD-10-CM

## 2010-04-06 DIAGNOSIS — IMO0002 Reserved for concepts with insufficient information to code with codable children: Secondary | ICD-10-CM

## 2010-04-06 DIAGNOSIS — I10 Essential (primary) hypertension: Secondary | ICD-10-CM

## 2010-04-06 NOTE — Patient Instructions (Signed)
Check your blood pressure daily in the morning for the next two weeks.  If it does not drop back to normal,,,,,,,, 135/85 or less,,,,,,,, call me.  Medical clearance okay for surgery

## 2010-04-06 NOTE — Progress Notes (Signed)
  Subjective:    Patient ID: Taylor Mccall, female    DOB: 09/05/1945, 65 y.o.   MRN: [id number]  HPI Taylor Mccall is a 65 year old female, who comes in today for presurgical clearance for a total left knee replacement.  She has seen her orthopedist, Dr. Priscille Kluver, and they recommended a total left knee replacement because of severe pain and inability to walk.  She had other orthopedic procedures in the past, including bilateral total hip replacements.  Left foot surgery.  Dr. Priscille Kluver will also have her consult with Dr. Victorino Dike because of some issues of her feet that may need to be corrected prior to the knee surgery.  She is always been in excellent health except for some underlying hypertension, for which she takes Lasix, 20 mg b.i.d., and 120 mEq potassium supplement.  BP today 140/96.  She will monitor her blood pressure at home and if it does not come back to normal,,,,,,,,,, 135/85 or less,,,,,,,,, then she will contact us and we will change her medication.  She otherwise is in excellent health except for chronic obesity.  I see no contraindications to surgery   Review of Systems  Constitutional: Negative.   HENT: Negative.   Eyes: Negative.   Respiratory: Negative.   Cardiovascular: Negative.   Gastrointestinal: Negative.   Genitourinary: Negative.   Musculoskeletal: Negative.   Neurological: Negative.   Hematological: Negative.   Psychiatric/Behavioral: Negative.        Objective:   Physical Exam  Constitutional: She appears well-developed and well-nourished. No distress.  Neck: Normal range of motion. Neck supple. No JVD present. No tracheal deviation present. No thyromegaly present.  Cardiovascular: Normal rate, regular rhythm, normal heart sounds and intact distal pulses.  Exam reveals no gallop and no friction rub.   No murmur heard. Pulmonary/Chest: Effort normal and breath sounds normal. No stridor. No respiratory distress. She has no wheezes. She has no rales. She  exhibits no tenderness.  Lymphadenopathy:    She has no cervical adenopathy.  Skin: She is not diaphoretic.          Assessment & Plan:  Hypertension,,,,,,,, monitor blood pressure call me if it does not drop back to normal.  Degenerative joint disease left knee,,,,,,,,,,, concur with total left knee replacement, also foot evaluation by Dr. Toni Arthurs

## 2010-05-31 ENCOUNTER — Other Ambulatory Visit: Payer: Self-pay | Admitting: Orthopedic Surgery

## 2010-05-31 ENCOUNTER — Ambulatory Visit (HOSPITAL_COMMUNITY)
Admission: RE | Admit: 2010-05-31 | Discharge: 2010-05-31 | Disposition: A | Discharge status: Home / Self Care | Payer: Medicare Other | Source: Ambulatory Visit | Attending: Orthopedic Surgery | Admitting: Orthopedic Surgery

## 2010-05-31 ENCOUNTER — Other Ambulatory Visit (HOSPITAL_COMMUNITY): Payer: Self-pay | Admitting: Orthopedic Surgery

## 2010-05-31 ENCOUNTER — Encounter (HOSPITAL_COMMUNITY): Payer: Medicare Other

## 2010-05-31 DIAGNOSIS — Z01818 Encounter for other preprocedural examination: Secondary | ICD-10-CM

## 2010-05-31 DIAGNOSIS — Z01812 Encounter for preprocedural laboratory examination: Secondary | ICD-10-CM | POA: Insufficient documentation

## 2010-05-31 DIAGNOSIS — M1710 Unilateral primary osteoarthritis, unspecified knee: Secondary | ICD-10-CM | POA: Insufficient documentation

## 2010-05-31 LAB — CBC
Hemoglobin: 12.6 g/dL (ref 12.0–15.0)
MCHC: 33.3 g/dL (ref 30.0–36.0)

## 2010-05-31 LAB — DIFFERENTIAL
Basophils Absolute: 0 10*3/uL (ref 0.0–0.1)
Basophils Relative: 1 % (ref 0–1)
Monocytes Absolute: 0.4 10*3/uL (ref 0.1–1.0)
Neutro Abs: 2.7 10*3/uL (ref 1.7–7.7)
Neutrophils Relative %: 53 % (ref 43–77)

## 2010-05-31 LAB — URINALYSIS, ROUTINE W REFLEX MICROSCOPIC
Glucose, UA: NEGATIVE mg/dL
Protein, ur: NEGATIVE mg/dL
Urobilinogen, UA: 0.2 mg/dL (ref 0.0–1.0)

## 2010-05-31 LAB — COMPREHENSIVE METABOLIC PANEL
BUN: 9 mg/dL (ref 6–23)
CO2: 28 mEq/L (ref 19–32)
Calcium: 9.7 mg/dL (ref 8.4–10.5)
Creatinine, Ser: 0.92 mg/dL (ref 0.4–1.2)
GFR calc Af Amer: >60 mL/min (ref >60)
GFR calc non Af Amer: >60 mL/min (ref >60)
Glucose, Bld: 79 mg/dL (ref 70–99)

## 2010-05-31 LAB — PROTIME-INR: Prothrombin Time: 14.4 seconds (ref 11.6–15.2)

## 2010-05-31 LAB — SURGICAL PCR SCREEN
MRSA, PCR: NEGATIVE
Staphylococcus aureus: NEGATIVE

## 2010-06-08 ENCOUNTER — Inpatient Hospital Stay (HOSPITAL_COMMUNITY)
Admission: RE | Admit: 2010-06-08 | Discharge: 2010-06-12 | DRG: 470 | Disposition: A | Discharge status: Home Health | Payer: Medicare Other | Source: Ambulatory Visit | Attending: Orthopedic Surgery | Admitting: Orthopedic Surgery

## 2010-06-08 DIAGNOSIS — M1710 Unilateral primary osteoarthritis, unspecified knee: Principal | ICD-10-CM | POA: Diagnosis present

## 2010-06-08 DIAGNOSIS — K5900 Constipation, unspecified: Secondary | ICD-10-CM | POA: Diagnosis not present

## 2010-06-08 DIAGNOSIS — Z01812 Encounter for preprocedural laboratory examination: Secondary | ICD-10-CM

## 2010-06-08 DIAGNOSIS — Z79899 Other long term (current) drug therapy: Secondary | ICD-10-CM

## 2010-06-08 DIAGNOSIS — F3289 Other specified depressive episodes: Secondary | ICD-10-CM | POA: Diagnosis present

## 2010-06-08 DIAGNOSIS — H3530 Unspecified macular degeneration: Secondary | ICD-10-CM | POA: Diagnosis present

## 2010-06-08 DIAGNOSIS — F329 Major depressive disorder, single episode, unspecified: Secondary | ICD-10-CM | POA: Diagnosis present

## 2010-06-08 DIAGNOSIS — D62 Acute posthemorrhagic anemia: Secondary | ICD-10-CM | POA: Diagnosis not present

## 2010-06-08 DIAGNOSIS — M069 Rheumatoid arthritis, unspecified: Secondary | ICD-10-CM | POA: Diagnosis present

## 2010-06-08 DIAGNOSIS — I10 Essential (primary) hypertension: Secondary | ICD-10-CM | POA: Diagnosis present

## 2010-06-08 DIAGNOSIS — G4733 Obstructive sleep apnea (adult) (pediatric): Secondary | ICD-10-CM | POA: Diagnosis present

## 2010-06-09 LAB — CBC
Hemoglobin: 9.1 g/dL — ABNORMAL LOW (ref 12.0–15.0)
RBC: 3.15 MIL/uL — ABNORMAL LOW (ref 3.87–5.11)

## 2010-06-09 LAB — BASIC METABOLIC PANEL
CO2: 26 mEq/L (ref 19–32)
Chloride: 105 mEq/L (ref 96–112)
GFR calc Af Amer: >60 mL/min (ref >60)
Potassium: 4.1 mEq/L (ref 3.5–5.1)
Sodium: 139 mEq/L (ref 135–145)

## 2010-06-10 LAB — CBC
HCT: 24.7 % — ABNORMAL LOW (ref 36.0–46.0)
Hemoglobin: 8.5 g/dL — ABNORMAL LOW (ref 12.0–15.0)
MCH: 29.8 pg (ref 26.0–34.0)
MCV: 86.7 fL (ref 78.0–100.0)
RBC: 2.85 MIL/uL — ABNORMAL LOW (ref 3.87–5.11)

## 2010-06-10 LAB — BASIC METABOLIC PANEL
BUN: 14 mg/dL (ref 6–23)
CO2: 27 mEq/L (ref 19–32)
Chloride: 104 mEq/L (ref 96–112)
Creatinine, Ser: 0.62 mg/dL (ref 0.4–1.2)
GFR calc Af Amer: >60 mL/min (ref >60)

## 2010-06-11 LAB — CBC
MCH: 29.4 pg (ref 26.0–34.0)
MCV: 87.1 fL (ref 78.0–100.0)
Platelets: 212 10*3/uL (ref 150–400)
RBC: 2.48 MIL/uL — ABNORMAL LOW (ref 3.87–5.11)

## 2010-06-12 LAB — CROSSMATCH
ABO/RH(D): B POS
Unit division: 0

## 2010-06-20 NOTE — Assessment & Plan Note (Signed)
OFFICE VISIT   Taylor Mccall, Taylor Mccall A  DOB:  Feb 28, 1945                                       09/05/2009  ZOXWR#:[id number]   REASON FOR VISIT:  Leg swelling.   HISTORY:  This is a 65 year old female I am seeing at the request Dr.  Tawanna Cooler for evaluation of significant leg swelling, with plans for foot  surgery in the near future to correct malalignment of her toe.  The  patient has had significant swelling in the past.  She has tried Lasix  which has not showed significant improvement.  She has been given a  prescription for compression stockings.  She had trouble with the thigh-  highs, as they would not stay up and she could not pay for the girdle  length.  She has not had any ulceration.  She does not have any pain.   PAST MEDICAL HISTORY:  Obesity, hypertension, osteoarthritis, urinary  incontinence, depression.   PAST SURGICAL HISTORY:  Left hip prosthesis, right hip pinning.   FAMILY HISTORY:  Positive for diabetes and stroke.   SOCIAL HISTORY:  Negative for tobacco.  No alcohol.   REVIEW OF SYSTEMS:  GENERAL:  Positive weight gain.  VASCULAR:  Positive for pain in the legs with walking and when lying  flat.  CARDIAC:  Positive for shortness of breath.  GI:  Negative.  NEURO:  Negative.  PULMONARY:  Negative.  HEME:  Negative.  GU:  Negative.  ENT:  Positive for recent change in eyesight.  MUSCULOSKELETAL:  Positive for arthritis.  PSYCH:  Positive for depression.   PHYSICAL EXAMINATION:  Heart rate 98, blood pressure 131/81, O2 sat 98%.  General:  Well-appearing, no distress.  Respirations are nonlabored.  Abdomen:  Obese.  Musculoskeletal:  Significant nonpitting bilateral  edema without ulceration.   DIAGNOSTIC STUDIES:  I have ordered and independently reviewed her  ultrasound.  Venous insufficiency exam was performed.  This was negative  for superficial vein thrombosis.  It was also negative for reflux, with  the exception of mild  reflux in the left common femoral vein.  Arterial  ultrasound was performed which shows that ankle brachial indices were  within normal limits bilaterally.   ASSESSMENT/PLAN:  Swelling:  Based on ultrasound findings, the patient  does not have venous insufficiency but rather lymphedema.  The best  course of management for this would be elevation and leg compression.  I  have given a prescription both for the girdle length compression  stockings as well as the knee-high.  This may be a cost issue for the  patient, and we already know that the thigh-highs do not stay up for  her, so the knee-high may be the best we can do if she cannot afford the  girdle length.  We discussed the need to wear her stockings to prevent  long-term lower extremity complications with ulceration and skin  changes.   The patient would be clear to proceed with a foot operation.     Jorge Ny, MD  Electronically Signed   VWB/MEDQ  D:  09/05/2009  T:  09/06/2009  Job:  [id number]   cc:   Tawanna Cooler, M.D.

## 2010-06-20 NOTE — Procedures (Signed)
DUPLEX DEEP VENOUS EXAM - LOWER EXTREMITY   INDICATION:  Questionable venous insufficiency.   HISTORY:  Edema:  Yes  Trauma/Surgery:  Left toe surgery.  Pain:  Yes  PE:  No  Previous DVT:  No  Anticoagulants:  No  Other:  No.   DUPLEX EXAM:                CFV   SFV   PopV  PTV    GSV                R  L  R  L  R  L  R   L  R  L  Thrombosis    0  0  0  0  0  0  0   0  0  0  Spontaneous   +  +  +  +  +  +  +   +  +  +  Phasic        +  +  +  +  +  +  +   +  +  +  Augmentation  +  +  +  +  +  +  +   +  +  +  Compressible  +  +  +  +  +  +  +   +  +  +  Competent     +  0  +  +  +  +  +   +  +  +   Legend:  + - yes  o - no  p - partial  D - decreased   IMPRESSION:  All veins imaged appear compressible and free from deep  vein thrombus in bilateral legs.  There is mild reflux noted in the left  common femoral vein.    _____________________________  V. Charlena Cross, MD   CB/MEDQ  D:  09/05/2009  T:  09/05/2009  Job:  [id number]

## 2010-06-20 NOTE — Procedures (Signed)
NAME:  Taylor Mccall, Taylor Mccall NO.:  [id number]   MEDICAL RECORD NO.:  [id number]          PATIENT TYPE:  OUT   LOCATION:  SLEEP CENTER                 FACILITY:  [facility]   PHYSICIAN:  Barbaraann Share, MD,FCCPDATE OF BIRTH:  1945/04/18   DATE OF STUDY:  09/03/2007                            NOCTURNAL POLYSOMNOGRAM   REFERRING PHYSICIAN:  Barbaraann Share, MD,FCCP   INDICATIONS FOR PROCEDURE:  Hypersomnia with sleep apnea.   RESULTS:  Epward score is 19.   SLEEP ARCHITECTURE:  The patient had total sleep time of 375 minutes  with no slow wave sleep and only 11 minutes or REM. Sleep onset latency  was normal at 13 minutes, and REM onset was very prolonged at 356  minutes. Sleep efficiency was moderately decreased at 83%.   RESPIRATORY DATA:  The patient was found to have 60 obstructive apnea's  and 69 hypopnea's, for an apnea/hypopnea index of 21 events per hour.  She was also found to have 67 respiratory effort-related arousals,  resulting in a respiratory disturbance index of 31 events per hour. The  events occurred primarily in the supine position, and there was moderate  snoring noted throughout.   OXYGEN DATA:  Thee was transient O2 desaturation as low as 89% with the  patient's obstructive events.   CARDIAC DATA:  No clinically significant arrhythmias were noted.   MOVEMENT/PARASOMNIA:  The patient was found to have 132 periodic leg  movements with only 1 per hour, resulting in arousal or awakening.   IMPRESSION/RECOMMENDATIONS:  1. Moderate obstructive sleep apnea/hypopnea syndrome with an AHI of      21 events per hour and an RDI of 31 events per hour with O2      desaturation as low as 89%. Treatment for this degree of sleep      apnea should focus primarily on weight loss, as well as CPAP,      however, upper airway surgery and dental appliance could be      considered. Clinical correlation is suggested.  2. Large numbers of leg jerks with minimal sleep  disruption. This was      more than likely related to the patient's      sleep disordered breathing. However, consideration could be given      to a primary movement disorder of sleep if the patient does not see      significant improvement with treatment of her sleep apnea.      Barbaraann Share, MD,FCCP  Diplomate, American Board of Sleep  Medicine  Electronically Signed     KMC/MEDQ  D:  09/16/2007 17:25:26  T:  09/16/2007 20:19:30  Job:  [id number]

## 2010-06-20 NOTE — Op Note (Signed)
NAME:  Taylor Mccall, Taylor Mccall            ACCOUNT NO.:  [id number]   MEDICAL RECORD NO.:  [id number]          PATIENT TYPE:  OIB   LOCATION:  1420                         FACILITY:  [facility]   PHYSICIAN:  Martina Sinner, MD DATE OF BIRTH:  01/05/1946   DATE OF PROCEDURE:  11/13/2007  DATE OF DISCHARGE:                               OPERATIVE REPORT   PREOPERATIVE DIAGNOSIS:  Vault prolapse, cystocele, rectocele.   POSTOPERATIVE DIAGNOSIS:  Vault prolapse, cystocele, rectocele.   SURGEON:  Martina Sinner, MD.   ASSISTANT:  Dr. Delman Kitten.   SURGERY:  Vault suspension with cystocele repair with graft plus  rectocele repair plus graft plus cystoscopy.   PROCEDURE IN DETAIL:  Taylor Mccall has the above diagnosis.  Her  preoperative laboratory tests were normal.  She was given preoperative  antibiotics.  We took extra care positioning her legs because of her  past orthopedic history and we wanted to minimize the risk of  compartment syndrome, neuropathy and DVT.  Under anesthesia her vaginal  cuff did descend from approximately 8 or 9 cm to approximately 5 cm.  She had a grade 3 cystocele just exiting the introitus.  She had a  diffuse grade 2 rectocele.  No obvious enterocele.   Vicryl 3-0 was placed at the vaginal apex.  I used a T-shaped incision  and sharply dissected the anterior vaginal wall from the underlying  pubocervical fascia after instilling 30 mL of a lidocaine/epinephrine  mixture.  The dissection went very well to the pelvic sidewall  bilaterally.  I did not encroach upon the bladder neck.  I did a two  layer imbricating anterior repair with running 2-0 Vicryl and then  cystoscoped the patient.  She had a nice camel hump deformity in the  midline, no distortion of the ureteral orifices and good blue bilateral  jets.   Following this I finger dissected to the large ischial spine bilaterally  and could see a thick sacrospinous muscle.  Using zero Ethibond on a  Capio device I placed a suture bilaterally and I was very happy with the  position which was double checked.  I then placed 2-0 Ethibond at the  pelvic sidewall and white line approaching the urethrovesical angle on a  CT2 needle.  Again I was happy with the strength of them.  I then cut my  10 x 6 graft in the shape of a hexagon and sewed it into the four  corners.  I was very happy with the repair.  Bleeding anteriorly was  minimal.  I removed approximately half a centimeter or a little bit more  of anterior vaginal wall mucosa and closed with running 2-0 Vicryl on a  CT1 needle.   I then did the posterior repair.  I instilled 20 mL of  lidocaine/epinephrine mixture.  I removed a small triangle of perineal  skin and did my usual midline incision sharply dissecting a very thin  posterior vaginal wall mucosa from the underlying rectovaginal fascia.  I dissected to the pelvic sidewall bilaterally.  Later in the case I  dissected near the apex breaking through the  rectal pillars and  identifying the ileal coccygeus muscle bilaterally.   I did a rectal examination and she had a large mid to high mid site  defect with moderately thick rectovaginal fascia above and below this.  I did an interrupted 2-0 Vicryl site defect closure of a defect that was  the full width of her vagina and probably 2.5 cm from cephalad to caudal  in its greatest dimension.  I was very happy with the closure and  repeated the rectal examination.  I placed two of the same zero Ethibond  using a CT2 needle through the ileal coccygeus muscle at the level of  the apex.  I used absorbable suture near the introitus through the  sidewall and rectovaginal fascia.  I turned a 7 x 4 graft laterally and  let it fold over at the apex.  I trimmed it and sewed it in very nicely  and buttressed the repair and support at the apex.  On repeat rectal  examination there was no distortion of the rectum and no suture in the  rectum.    I trimmed a few millimeters of posterior vaginal wall mucosa and closed  posteriorly.  Vicryl 2-0 sutures were used for a perineal closure since  she had a wide introitus.  The running intravaginal suture was brought  out to the perineum and I closed the subcuticular tissue with the same  suture.   Vaginal length was excellent.  Bleeding was minimal.  Vaginal pack with  Estrace cream was inserted.  Total blood loss was 200 mL.  Leg position  was good.  Foley catheter output was excellent throughout the case.  The  patient was taken to the recovery room.  Hopefully this operation will  reach her treatment goal.           ______________________________  Martina Sinner, MD  Electronically Signed     SAM/MEDQ  D:  11/13/2007  T:  11/13/2007  Job:  [id number]

## 2010-07-04 NOTE — Op Note (Signed)
NAME:  Taylor Mccall, Taylor Mccall            ACCOUNT NO.:  [id number]  MEDICAL RECORD NO.:  [id number]           PATIENT TYPE:  I  LOCATION:  0002                         FACILITY:  [facility]  PHYSICIAN:  Selyna Klahn L. Rendall, M.D.  DATE OF BIRTH:  11-Apr-1945  DATE OF PROCEDURE:  06/08/2010 DATE OF DISCHARGE:                              OPERATIVE REPORT   PREOPERATIVE DIAGNOSIS:  Osteoarthritis, left knee.  SURGICAL PROCEDURES:  Left LCS total knee arthroplasty with computer navigation assistance.  POSTOPERATIVE DIAGNOSIS:  Osteoarthritis, left knee.  SURGEON:  Juletta Berhe L. Rendall, M.D.  ASSISTANT:  Legrand Pitts. Duffy, P.A.C. - present and participating in the entire procedure.  PATHOLOGY:  The patient has bone against bone medially, some arthritic change laterally, and total bone against bone patella and the femoral trochlea.  She has had unremitting pain now and all conservative measures have failed.  INDICATIONS FOR SURGERY:  Pain.  DESCRIPTION OF PROCEDURE:  Under general anesthesia with a left femoral nerve block, the leg was prepared with DuraPrep and draped as a sterile field.  A sterile tourniquet was used proximally.  The leg was wrapped out with an Esmarch and the tourniquet was elevated 350 mmHg.  Midline incision was made.  The patella was everted.  The femur was sized to a standard plus.  Debridement was carried out in preparation for computer mapping.  Once the debridement was completed, Schanz pins were placed through accessory punctures in the proximal medial tibia and in the distal femur.  The arrays were set up.  The femoral head was found.  The medial and lateral malleoli were found, proximal tibia and distal femur were mapped.  Once this was completed, the first computer guide was used and the proximal tibial resection was done within 1 degree of anatomic alignment.  The tensioner was then inserted and further releases were required medially to get to within 1 degree of  anatomic alignment.  The flexion gap was then measured.  Then using the first femoral guide the anterior and posterior flare of the distal femur were resected.  The computer did agree on a standard plus for the femur and a 12.5 flexion gap in the planning stage.  Once the anterior and posterior distal femur were resected, the distal femoral cut was made, also with computer, both within 1 degree of anatomic rotation and alignment respectively.  Once this was done, the lamina spreaders was inserted and the remnants of the menisci and cruciates were resected.  Spurs were taken off the back of the femoral condyles with osteotome and mallet.  Once the debridement was completed, the recessing guide was used.  With the femur completed, the Mchale and Holmner inserted, the proximal tibia was exposed.  It was sized at a #3, center peg hole with keel were inserted, 12.5 bearing was inserted and the standard plus femur.  It was determined that the alignment of the leg was good but there was slight hyperextension and a 15 bearing corrects this and has alignment within 1 degree of anatomic. At this point, permanent components were obtained.  Bony surfaces were prepared with pulse irrigation.  All components were cemented  in place. The cement, once it was hardened the tourniquet was let down after 1 hour and 5 minutes.  Multiple small vessels were cauterized.  A medium Hemovac was inserted.  The knee was then closed in layers with #1 Tycron, #1 Vicryl, 2-0 Vicryl and skin clips.  The patient tolerated the procedure well and returned to recovery in good condition.  The knee had excellent stability and flexed easily to 115 degrees on just the gravity test.     Mick Tanguma L. Priscille Kluver, M.D.     Renato Gails  D:  06/08/2010  T:  06/08/2010  Job:  [id number]  Electronically Signed by Erasmo Leventhal M.D. on 07/04/2010 05:37:28 PM

## 2010-07-04 NOTE — Discharge Summary (Signed)
NAME:  NEDA, WILLENBRING            ACCOUNT NO.:  [id number]  MEDICAL RECORD NO.:  [id number]           PATIENT TYPE:  I  LOCATION:  1602                         [facility]AC[facility]  PHYSICIAN:  Caspian Deleonardis L. Rendall, M.D.  DATE OF BIRTH:  Jul 01, 1945  DATE OF ADMISSION:  06/08/2010 DATE OF DISCHARGE:  06/12/2010                              DISCHARGE SUMMARY   ADMISSION DIAGNOSES: 1. End-stage osteoarthritis/rheumatoid arthritis, left knee. 2. Hypertension. 3. Rheumatoid arthritis. 4. Depression. 5. Macular degeneration. 6. Sleep apnea.  DISCHARGE DIAGNOSES: 1. End-stage osteoarthritis/rheumatoid arthritis of left knee status     post left total knee arthroplasty. 2. Acute blood loss anemia secondary to surgery. 3. Constipation, now resolved. 4. Hypertension. 5. Rheumatoid arthritis. 6. Depression. 7. Macular degeneration. 8. Sleep apnea.  SURGICAL PROCEDURES:  On Jun 08, 2010, Ms. Mccord underwent a lefttotal knee arthroplasty with computer navigation by Dr. Jonny Ruiz L. Rendall assisted by Arnoldo Morale PA-C.  She had a primary femoral component cemented size standard plus left placed with an LCS complete metal back patella cemented size standard plus, LCS complete RP insert size standard plus 15-mm thickness and a tibial tray rotating platform MBT keel size 3 cemented.  COMPLICATIONS:  None.  CONSULTANTS: 1. Respiratory Therapy and Physical Therapy consult on Jun 08, 2010,     and Jun 09, 2010. 2. [facility] consult on Jun 09, 2010, in addition to a case     management consult. 3. Physical Therapy consult on Jun 09, 2010.  HISTORY OF PRESENT ILLNESS:  This 65 year old black female patient presented to Dr. Priscille Kluver with 5-year history of gradual onset of progressive left knee pain without injury or surgery.  The left knee pain is now constant throb over the anterior medial joint with some radiation up and down the leg.  Pain increases with walking and decreases with rest.   Advil provides some relief but the knee pops, grinds, gives way and keeps her up at night.  She has had cortisone shots without relief and she has failed conservative treatment.  Because of that, she is presenting for left knee replacement.  HOSPITAL COURSE:  Ms. Dobie tolerated her surgical procedure well and without immediate postoperative complications.  She was transferred to the orthopedic floor.  Postop day #1, she was afebrile, vitals were stable.  Hemoglobin 9.1, hematocrit 27.6.  Hemovac was DC'ed.  She  was tolerating CPM 0 to 70 and she was started on therapy per protocol.  Postop day #2, she was doing well.  She still remained afebrile, hemoglobin had dropped to 8.5 with hematocrit of 24.7.  She was asymptomatic and continued on therapy.  [facility] had seemed to agree to take her as an outpatient home health therapy patient is she progressed enough and she was continued on therapy.  She continued to make slow progress but did progress.  On Jun 11, 2010, her hemoglobin had dropped to 7.3 with hematocrit of 21.6 and she was transfused with 2 units of packed red blood cells.  She tolerated that well.  On Jun 12, 2010, she was doing well.  She would like to go home.  She is tolerating therapy well.  She is afebrile, vitals stable and, left knee incision well-approximated with staples.  No drainage.  It is felt she is doing well enough for a rehab standpoint that she could go home and she will be DC'ed home.  DISCHARGE INSTRUCTIONS:  DIET:  She is to resume her regular prehospitalization diet.  MEDICATIONS:  Please see the home patient med discharge instruction sheet for complete documentation of her medications.  The new medicines we added this admission were Celebrex for a month, Robaxin 500 mg, Percocet 5, and Xarelto 10 mg.  Again you can see the med rec for complete dosing information on that.  ACTIVITY:  She is to be out of bed weightbearing as tolerated  on the left leg with use of walker.  She is to have home CPM 0 to 90 degrees 6 to 8 hours a day.  Please see the white total joint discharge sheet for complete documentation of activity instructions.  WOUND CARE:  Please see the white total joint discharge sheet for complete documentation of wound care instructions.  FOLLOWUP:  She is to follow up with Dr. Priscille Kluver in the office in [place] on Thursday Jun 22, 2010, and needs to call [phone] for that appointment.  She is arranged for home health care per [facility].  LABORATORY DATA:  Hemoglobin/hematocrit ranged from 9.1 and 27.6 on the 4th to 7.3 and 21.6 on the 6th.  White count and platelets were within normal limits.  Potassium went from 4.1 on the 4th to 3.3 on the 5th.  Glucose ranged from 120 on the 4th to 113 on the 5th.  All other laboratory studies were within normal limits.  Chest x-ray done on May 31, 2010, showed no evidence of acute cardiac or pulmonary process.  All other laboratory studies were within normal limits.     Legrand Pitts Duffy, P.A.   ______________________________ Carlisle Beers. Priscille Kluver, M.D.    KED/MEDQ  D:  06/12/2010  T:  06/12/2010  Job:  [id number]  Electronically Signed by Arnoldo Morale P.A. on 06/19/2010 08:22:49 AM Electronically Signed by Erasmo Leventhal M.D. on 07/04/2010 05:37:24 PM

## 2010-10-16 ENCOUNTER — Ambulatory Visit (INDEPENDENT_AMBULATORY_CARE_PROVIDER_SITE_OTHER): Payer: Medicare Other | Admitting: Family Medicine

## 2010-10-16 ENCOUNTER — Encounter: Payer: Self-pay | Admitting: Family Medicine

## 2010-10-16 VITALS — BP 130/90 | Temp 98.2°F | Wt 262.0 lb

## 2010-10-16 DIAGNOSIS — R1110 Vomiting, unspecified: Secondary | ICD-10-CM

## 2010-10-16 NOTE — Progress Notes (Signed)
  Subjective:    Patient ID: Taylor Mccall, female    DOB: May 31, 1945, 65 y.o.   MRN: [id number]  HPI Taylor Mccall is a 65 year old female comes in today with nausea and vomiting x 2.  She states she awoke this morning feeling nauseated threw up twice and came to the office for evaluation.  No fever, chills, diarrhea, etc..  No abdominal pain   Review of Systems General in general, review of systems otherwise negative    Objective:   Physical Exam  Well-developed well-nourished, female, in no acute distress.  Examination the abdomen shows the abdomen is soft, somewhat obese.  Bowel sounds normal.  No tenderness no rebound.  No palpable masses     Assessment & Plan:  Probable viral gastroenteritis plan and medications clear liquids.  Return p.r.n.

## 2010-10-16 NOTE — Patient Instructions (Signed)
Hold all your medications.  Rest at home.  Clear liquids.  Return p.r.n.

## 2010-11-07 LAB — CBC
HCT: 32.2 — ABNORMAL LOW
Hemoglobin: 11 — ABNORMAL LOW
MCHC: 33.1
MCV: 89.2
Platelets: 209
Platelets: 258
RDW: 12.7
RDW: 13.3

## 2010-11-07 LAB — TYPE AND SCREEN: Antibody Screen: POSITIVE

## 2010-11-07 LAB — PROTIME-INR
INR: 1
Prothrombin Time: 13.8

## 2010-11-07 LAB — BASIC METABOLIC PANEL
BUN: 9
Calcium: 9.4
GFR calc non Af Amer: >60
Glucose, Bld: 100 — ABNORMAL HIGH

## 2010-12-14 ENCOUNTER — Ambulatory Visit (INDEPENDENT_AMBULATORY_CARE_PROVIDER_SITE_OTHER): Payer: Medicare Other | Admitting: Family Medicine

## 2010-12-14 DIAGNOSIS — Z23 Encounter for immunization: Secondary | ICD-10-CM

## 2010-12-21 ENCOUNTER — Encounter: Payer: No Typology Code available for payment source | Admitting: Family Medicine

## 2011-01-18 ENCOUNTER — Ambulatory Visit (INDEPENDENT_AMBULATORY_CARE_PROVIDER_SITE_OTHER): Payer: Medicare Other | Admitting: Family Medicine

## 2011-01-18 ENCOUNTER — Encounter: Payer: Self-pay | Admitting: Family Medicine

## 2011-01-18 DIAGNOSIS — I10 Essential (primary) hypertension: Secondary | ICD-10-CM

## 2011-01-18 DIAGNOSIS — F3289 Other specified depressive episodes: Secondary | ICD-10-CM

## 2011-01-18 DIAGNOSIS — R269 Unspecified abnormalities of gait and mobility: Secondary | ICD-10-CM

## 2011-01-18 DIAGNOSIS — E669 Obesity, unspecified: Secondary | ICD-10-CM

## 2011-01-18 DIAGNOSIS — I872 Venous insufficiency (chronic) (peripheral): Secondary | ICD-10-CM

## 2011-01-18 DIAGNOSIS — M1990 Unspecified osteoarthritis, unspecified site: Secondary | ICD-10-CM

## 2011-01-18 DIAGNOSIS — F329 Major depressive disorder, single episode, unspecified: Secondary | ICD-10-CM

## 2011-01-18 DIAGNOSIS — N6320 Unspecified lump in the left breast, unspecified quadrant: Secondary | ICD-10-CM | POA: Insufficient documentation

## 2011-01-18 DIAGNOSIS — N63 Unspecified lump in unspecified breast: Secondary | ICD-10-CM

## 2011-01-18 LAB — HEPATIC FUNCTION PANEL
ALT: 17 U/L (ref 0–35)
AST: 20 U/L (ref 0–37)
Bilirubin, Direct: 0.1 mg/dL (ref 0.0–0.3)
Total Bilirubin: 1 mg/dL (ref 0.3–1.2)

## 2011-01-18 LAB — POCT URINALYSIS DIPSTICK
Blood, UA: NEGATIVE
Ketones, UA: NEGATIVE
Protein, UA: NEGATIVE
Spec Grav, UA: >=1.03
Urobilinogen, UA: 0.2
pH, UA: 5.5

## 2011-01-18 LAB — CBC WITH DIFFERENTIAL/PLATELET
Basophils Relative: 0.8 % (ref 0.0–3.0)
Eosinophils Absolute: 0.3 10*3/uL (ref 0.0–0.7)
HCT: 38.3 % (ref 36.0–46.0)
Lymphs Abs: 1 10*3/uL (ref 0.7–4.0)
MCHC: 33.4 g/dL (ref 30.0–36.0)
MCV: 90.8 fl (ref 78.0–100.0)
Monocytes Absolute: 0.3 10*3/uL (ref 0.1–1.0)
Neutrophils Relative %: 62.3 % (ref 43.0–77.0)
RBC: 4.22 Mil/uL (ref 3.87–5.11)

## 2011-01-18 LAB — LIPID PANEL
Total CHOL/HDL Ratio: 3
VLDL: 10.2 mg/dL (ref 0.0–40.0)

## 2011-01-18 LAB — BASIC METABOLIC PANEL
BUN: 18 mg/dL (ref 6–23)
CO2: 28 mEq/L (ref 19–32)
Calcium: 9.7 mg/dL (ref 8.4–10.5)
Chloride: 108 mEq/L (ref 96–112)
Creatinine, Ser: 0.9 mg/dL (ref 0.4–1.2)
Glucose, Bld: 92 mg/dL (ref 70–99)

## 2011-01-18 MED ORDER — FUROSEMIDE 20 MG PO TABS: 20.0000 mg | ORAL_TABLET | Freq: Two times a day (BID) | ORAL | Status: DC

## 2011-01-18 MED ORDER — POTASSIUM CHLORIDE CRYS ER 20 MEQ PO TBCR: 20.0000 meq | EXTENDED_RELEASE_TABLET | Freq: Every day | ORAL | Status: DC

## 2011-01-18 MED ORDER — SERTRALINE HCL 100 MG PO TABS: 100.0000 mg | ORAL_TABLET | Freq: Every day | ORAL | Status: DC

## 2011-01-18 NOTE — Progress Notes (Signed)
  Subjective:    Patient ID: Taylor Mccall, female    DOB: [birth year], 65 y.o.   MRN: [id number]  HPI Taylor Mccall is a 65 year old single female, nonsmoker, who comes in today for Medicare wellness examination because of a history of hypertension, arthritis, and depression, and severe degenerative joint disease.  She takes Lasix 20 mg daily and potassium supplement for hypertension.  BP 128/88.  She takes plaquenil at the direction of her rheumatologist, for severe arthritis.  She takes Zoloft 100 mg nightly for mild depression.  Her weight is unchanged 262 pounds.  She states she cannot exercise because of the problem with her feet.  In May.  She had her left knee replaced.  Procedure went well.  No complications.  She gets routine eye care, hearing diminished, but she declines a hearing aid evaluation, where her dental care, does not do BSE monthly.  However, she does get an annual mammogram.  Colonoscopy up-to-date, flu shot 2012, tetanus, 2012, Pneumovax 2010, information given on shingles.  Cognitive function, normal.  Encouraged to begin an exercise program.  She lives alone and does all her own finances.  Home health safety reviewed.  No issues identified.  No guns in the house, and she does have a healthcare power of attorney and living will   Review of Systems  Constitutional: Negative.   HENT: Negative.   Eyes: Negative.   Respiratory: Negative.   Cardiovascular: Negative.   Gastrointestinal: Negative.   Genitourinary: Negative.   Musculoskeletal: Negative.   Neurological: Negative.   Hematological: Negative.   Psychiatric/Behavioral: Negative.        Objective:   Physical Exam  Constitutional: She appears well-developed and well-nourished.  HENT:  Head: Normocephalic and atraumatic.  Right Ear: External ear normal.  Left Ear: External ear normal.  Nose: Nose normal.  Mouth/Throat: Oropharynx is clear and moist.  Eyes: EOM are normal. Pupils are equal, round,  and reactive to light.  Neck: Normal range of motion. Neck supple. No thyromegaly present.  Cardiovascular: Normal rate, regular rhythm, normal heart sounds and intact distal pulses.  Exam reveals no gallop and no friction rub.   No murmur heard. Pulmonary/Chest: Effort normal and breath sounds normal.  Abdominal: Soft. Bowel sounds are normal. She exhibits no distension and no mass. There is no tenderness. There is no rebound.  Genitourinary: No vaginal discharge found.       Bilateral breast exam normal except for scars from previous reduction mammoplasty  Musculoskeletal: Normal range of motion.       Scar left knee from previous knee replacement, May 2012 deformity of both feet.  Referred to Dr. Oris Drone  Lymphadenopathy:    She has no cervical adenopathy.  Neurological: She is alert. She has normal reflexes. No cranial nerve deficit. She exhibits normal muscle tone. Coordination normal.  Skin: Skin is warm and dry.  Psychiatric: She has a normal mood and affect. Her behavior is normal. Judgment and thought content normal.   At the 3 o'clock position left breast 2 inches from the nipple.  There is an egg-sized soft, movable lesion, consistent with a cyst       Assessment & Plan:  Healthy female.  Obesity.  Again counseled on diet, exercise and weight loss.  Arthritis.  Continue follow-up by rheumatologist.  Hypertension.  Continue Lasix and potassium.  Mild depression.  Continue Zoloft.  Left breast lump.  New mammogram in October.  Normal.  Referred back to the breast center for an ultrasound

## 2011-01-18 NOTE — Patient Instructions (Signed)
Continue your current medications.  We will do to set up over the next week or so for an ultrasound of the cystic lesion in her left breast.  Return in one year or sooner for any problems.  Begin a daily walking program

## 2011-01-19 ENCOUNTER — Other Ambulatory Visit: Payer: Self-pay | Admitting: Family Medicine

## 2011-01-19 DIAGNOSIS — N63 Unspecified lump in unspecified breast: Secondary | ICD-10-CM

## 2011-01-23 NOTE — Progress Notes (Signed)
Quick Note:  Pt aware ______ 

## 2011-07-06 ENCOUNTER — Ambulatory Visit (INDEPENDENT_AMBULATORY_CARE_PROVIDER_SITE_OTHER): Payer: Medicare Other | Admitting: Pulmonary Disease

## 2011-07-06 ENCOUNTER — Encounter: Payer: Self-pay | Admitting: *Deleted

## 2011-07-06 VITALS — BP 128/88 | HR 77 | Temp 98.2°F | Ht 70.0 in | Wt 264.4 lb

## 2011-07-06 DIAGNOSIS — G4733 Obstructive sleep apnea (adult) (pediatric): Secondary | ICD-10-CM

## 2011-07-06 NOTE — Patient Instructions (Signed)
Will get your dme to cover the expense of your new mask Would like to optimize your pressure again on the auto setting, and will let you know the results when the download is available to me. Adjust heater on humidifier to get more moisture into the system. Work on weight loss followup with me in one year.

## 2011-07-06 NOTE — Progress Notes (Signed)
  Subjective:    Patient ID: Taylor Mccall, female    DOB: March 13, 1945, 66 y.o.   MRN: [id number]  HPI Patient comes in today for followup of her obstructive sleep apnea.  She has not been seen in almost 2 years, but has been fairly compliant with her CPAP device.  She does need to wear more hours during each night.  She feels that she sleeps much better with the device, with improved daytime alertness.  However, she has recently been having more awakenings, and is blaming this on nocturia.   Review of Systems  Constitutional: Negative.  Negative for fever and unexpected weight change.  HENT: Positive for sneezing. Negative for ear pain, nosebleeds, congestion, sore throat, rhinorrhea, trouble swallowing, dental problem, postnasal drip and sinus pressure.   Eyes: Positive for itching. Negative for redness.  Respiratory: Positive for shortness of breath. Negative for cough, chest tightness and wheezing.   Cardiovascular: Positive for leg swelling. Negative for palpitations.  Gastrointestinal: Negative.  Negative for nausea and vomiting.  Genitourinary: Negative.  Negative for dysuria.  Musculoskeletal: Negative.  Negative for joint swelling.  Skin: Negative for rash.  Neurological: Negative.  Negative for headaches.  Hematological: Negative.  Does not bruise/bleed easily.  Psychiatric/Behavioral: Positive for dysphoric mood. The patient is not nervous/anxious.        Pt has been started on Zoloft and she said that her moods are getting better.       Objective:   Physical Exam Obese female in no acute distress no skin breakdown or pressure necrosis from the CPAP mask Lower extremities with 2+ edema bilaterally, no cyanosis alert, does not appear to be overly sleepy, moves all 4 extremities.      Assessment & Plan:

## 2011-07-06 NOTE — Assessment & Plan Note (Signed)
The patient has been wearing CPAP consistently since her last visit 2 years ago, but continues to have frequent awakenings that she blames on nocturia.  I think she needs to have optimization of her pressure with an AutoSet device to make sure her that her awakenings are not due to breakthrough apneas.  She is also been having issues with dryness, and I have given her instructions on how to operate her heated humidifier.  Finally, I have encouraged her to work aggressively on weight loss.

## 2012-02-11 ENCOUNTER — Telehealth: Payer: Self-pay | Admitting: Family Medicine

## 2012-02-11 DIAGNOSIS — I10 Essential (primary) hypertension: Secondary | ICD-10-CM

## 2012-02-11 DIAGNOSIS — F3289 Other specified depressive episodes: Secondary | ICD-10-CM

## 2012-02-11 DIAGNOSIS — F329 Major depressive disorder, single episode, unspecified: Secondary | ICD-10-CM

## 2012-02-11 MED ORDER — SERTRALINE HCL 100 MG PO TABS: 100.0000 mg | ORAL_TABLET | Freq: Every day | ORAL | Status: DC

## 2012-02-11 MED ORDER — FUROSEMIDE 20 MG PO TABS: 20.0000 mg | ORAL_TABLET | Freq: Two times a day (BID) | ORAL | Status: DC

## 2012-02-11 MED ORDER — POTASSIUM CHLORIDE CRYS ER 20 MEQ PO TBCR: 20.0000 meq | EXTENDED_RELEASE_TABLET | Freq: Every day | ORAL | Status: DC

## 2012-02-11 NOTE — Telephone Encounter (Signed)
Pt needs refill of the following: furosemide (LASIX) 20 MG tablet potassium chloride SA (K-DUR,KLOR-CON) 20 MEQ tablet sertraline (ZOLOFT) 100 MG tablet Pls call in to [facility], 90 day

## 2012-03-06 ENCOUNTER — Other Ambulatory Visit (INDEPENDENT_AMBULATORY_CARE_PROVIDER_SITE_OTHER): Payer: Medicare Other

## 2012-03-06 DIAGNOSIS — Z0000 Encounter for general adult medical examination without abnormal findings: Secondary | ICD-10-CM

## 2012-03-06 LAB — CBC WITH DIFFERENTIAL/PLATELET
Basophils Absolute: 0 10*3/uL (ref 0.0–0.1)
Basophils Relative: 1.1 % (ref 0.0–3.0)
Eosinophils Absolute: 0.2 10*3/uL (ref 0.0–0.7)
HCT: 36.6 % (ref 36.0–46.0)
Hemoglobin: 12.2 g/dL (ref 12.0–15.0)
Lymphocytes Relative: 36.5 % (ref 12.0–46.0)
Lymphs Abs: 1.3 10*3/uL (ref 0.7–4.0)
MCHC: 33.3 g/dL (ref 30.0–36.0)
MCV: 88 fl (ref 78.0–100.0)
Neutro Abs: 1.7 10*3/uL (ref 1.4–7.7)
RBC: 4.16 Mil/uL (ref 3.87–5.11)
RDW: 12.7 % (ref 11.5–14.6)

## 2012-03-06 LAB — LIPID PANEL
Cholesterol: 147 mg/dL (ref 0–200)
LDL Cholesterol: 92 mg/dL (ref 0–99)
Total CHOL/HDL Ratio: 3
Triglycerides: 62 mg/dL (ref 0.0–149.0)

## 2012-03-06 LAB — POCT URINALYSIS DIPSTICK
Bilirubin, UA: NEGATIVE
Glucose, UA: NEGATIVE
Nitrite, UA: POSITIVE
Spec Grav, UA: 1.025
Urobilinogen, UA: 0.2

## 2012-03-06 LAB — HEPATIC FUNCTION PANEL
Bilirubin, Direct: 0.1 mg/dL (ref 0.0–0.3)
Total Bilirubin: 0.7 mg/dL (ref 0.3–1.2)

## 2012-03-06 LAB — BASIC METABOLIC PANEL
BUN: 16 mg/dL (ref 6–23)
Calcium: 8.9 mg/dL (ref 8.4–10.5)
Chloride: 109 mEq/L (ref 96–112)
Creatinine, Ser: 0.8 mg/dL (ref 0.4–1.2)

## 2012-03-06 LAB — TSH: TSH: 1.47 u[IU]/mL (ref 0.35–5.50)

## 2012-03-13 ENCOUNTER — Encounter: Payer: Medicare Other | Admitting: Family Medicine

## 2012-03-18 ENCOUNTER — Encounter: Payer: Self-pay | Admitting: Family Medicine

## 2012-03-18 ENCOUNTER — Ambulatory Visit (INDEPENDENT_AMBULATORY_CARE_PROVIDER_SITE_OTHER): Payer: Medicare Other | Admitting: Family Medicine

## 2012-03-18 VITALS — BP 120/84 | Temp 99.0°F | Ht 70.0 in | Wt 256.0 lb

## 2012-03-18 DIAGNOSIS — F329 Major depressive disorder, single episode, unspecified: Secondary | ICD-10-CM

## 2012-03-18 DIAGNOSIS — G4733 Obstructive sleep apnea (adult) (pediatric): Secondary | ICD-10-CM

## 2012-03-18 DIAGNOSIS — M1990 Unspecified osteoarthritis, unspecified site: Secondary | ICD-10-CM

## 2012-03-18 DIAGNOSIS — R269 Unspecified abnormalities of gait and mobility: Secondary | ICD-10-CM

## 2012-03-18 DIAGNOSIS — F3289 Other specified depressive episodes: Secondary | ICD-10-CM

## 2012-03-18 DIAGNOSIS — I872 Venous insufficiency (chronic) (peripheral): Secondary | ICD-10-CM

## 2012-03-18 DIAGNOSIS — I10 Essential (primary) hypertension: Secondary | ICD-10-CM

## 2012-03-18 DIAGNOSIS — M25579 Pain in unspecified ankle and joints of unspecified foot: Secondary | ICD-10-CM

## 2012-03-18 DIAGNOSIS — E669 Obesity, unspecified: Secondary | ICD-10-CM

## 2012-03-18 DIAGNOSIS — R32 Unspecified urinary incontinence: Secondary | ICD-10-CM

## 2012-03-18 MED ORDER — POTASSIUM CHLORIDE CRYS ER 20 MEQ PO TBCR: EXTENDED_RELEASE_TABLET | ORAL | Status: DC

## 2012-03-18 MED ORDER — SERTRALINE HCL 100 MG PO TABS: 100.0000 mg | ORAL_TABLET | Freq: Every day | ORAL | Status: DC

## 2012-03-18 MED ORDER — FUROSEMIDE 20 MG PO TABS: ORAL_TABLET | ORAL | Status: DC

## 2012-03-18 NOTE — Progress Notes (Signed)
  Subjective:    Patient ID: Taylor Mccall, female    DOB: [birth year], 67 y.o.   MRN: [id number]  HPI  Taylor Mccall is a 67 year old married female nonsmoker who comes in today for a Medicare wellness examination because of a history of venous insufficiency on Lasix 20 mg twice a day, rheumatoid arthritis on Plaquenil 200 mg daily, low potassium supplement 20 mEq daily, mild depression on Zoloft 100 mg daily. Recently she was given eyedrops because of glaucoma. She had a left total knee replacement and is due to go back to see Dr. Victorino Mccall her foot surgeon  She gets routine eye care, dental care, does not check her breasts monthly but does get an annual mammogram vaccinations up-to-date  She's unable to walk on a regular basis because of her physical limitations home health safety reviewed no issues identified, no guns in the house, she does have a health care power of attorney and a living well  Review of Systems  Constitutional: Negative.   HENT: Negative.   Eyes: Negative.   Respiratory: Negative.   Cardiovascular: Negative.   Gastrointestinal: Negative.   Genitourinary: Negative.   Musculoskeletal: Negative.   Neurological: Negative.   Psychiatric/Behavioral: Negative.        Objective:   Physical Exam  Constitutional: She appears well-developed and well-nourished.  HENT:  Head: Normocephalic and atraumatic.  Right Ear: External ear normal.  Left Ear: External ear normal.  Nose: Nose normal.  Mouth/Throat: Oropharynx is clear and moist.  Eyes: EOM are normal. Pupils are equal, round, and reactive to light.  Neck: Normal range of motion. Neck supple. No thyromegaly present.  Cardiovascular: Normal rate, regular rhythm, normal heart sounds and intact distal pulses.  Exam reveals no gallop and no friction rub.   No murmur heard. Pulmonary/Chest: Effort normal and breath sounds normal.  Abdominal: Soft. Bowel sounds are normal. She exhibits no distension and no mass. There is no  tenderness. There is no rebound.  Genitourinary:  Bilateral breast exam normal scars from reduction mammoplasty no palpable masses  Musculoskeletal: Normal range of motion.  Scar anterior left knee from previous total knee replacement scar left foot from previous foot surgery  Lymphadenopathy:    She has no cervical adenopathy.  Neurological: She is alert. She has normal reflexes. No cranial nerve deficit. She exhibits normal muscle tone. Coordination normal.  Skin: Skin is warm and dry.  Psychiatric: She has a normal mood and affect. Her behavior is normal. Judgment and thought content normal.          Assessment & Plan:  Healthy female  Obesity she states she's unable to exercise  Rheumatoid arthritis continue Plaquenil  Venous insufficiency increase Lasix to 40 mg in the morning 20 in the afternoon double potassium to one twice daily  History of depression continue Zoloft  Recent onset glaucoma continue followup by ophthalmologist and eye drops

## 2012-03-18 NOTE — Patient Instructions (Signed)
Continue your current medications  Followup in 1 year sooner if any problems  Consult with Dr. Toni Arthurs about your foot  Despite not being able to exercise,,,,,,,,,,,,,,, work on a diet to lose 10 pounds in 12 months

## 2012-04-23 ENCOUNTER — Other Ambulatory Visit: Payer: Self-pay | Admitting: Family Medicine

## 2012-07-11 ENCOUNTER — Ambulatory Visit (INDEPENDENT_AMBULATORY_CARE_PROVIDER_SITE_OTHER): Payer: Medicare Other | Admitting: Pulmonary Disease

## 2012-07-11 ENCOUNTER — Encounter: Payer: Self-pay | Admitting: Pulmonary Disease

## 2012-07-11 VITALS — BP 122/80 | HR 77 | Temp 97.8°F | Ht 70.0 in | Wt 257.2 lb

## 2012-07-11 DIAGNOSIS — G4733 Obstructive sleep apnea (adult) (pediatric): Secondary | ICD-10-CM

## 2012-07-11 NOTE — Patient Instructions (Addendum)
Turn the heat down on your heated humidifier now that summer is here.  Should make the water last longer. You need to get a new seal for your mask, and change every 2-3 mos. Work on weigh loss Will see if we ever got your machine set on a fixed pressure. followup with me in one year if doing well, but call if having issues.

## 2012-07-11 NOTE — Progress Notes (Signed)
  Subjective:    Patient ID: Taylor Mccall, female    DOB: 12-29-1945, 67 y.o.   MRN: [id number]  HPI The patient comes in today for followup of her obstructive sleep apnea.  She is wearing CPAP fairly compliantly, but I would like to see her increase the number of hours of use.  She is having issues with mask leaks, but has not changed her cushion in almost 7 months.  We never received her download from last year, and it is unclear if she is on the automatic setting or a fixed pressure.  The patient has continued to gain weight, and I have asked her to work on this.  She sleeps fairly well with the device, with adequate daytime alertness.   Review of Systems  Constitutional: Negative for fever and unexpected weight change.  HENT: Positive for congestion. Negative for ear pain, nosebleeds, sore throat, rhinorrhea, sneezing, trouble swallowing, dental problem, postnasal drip and sinus pressure.   Eyes: Negative for redness and itching.  Respiratory: Negative for cough, chest tightness, shortness of breath and wheezing.   Cardiovascular: Positive for leg swelling. Negative for palpitations.  Gastrointestinal: Negative for nausea and vomiting.  Genitourinary: Negative for dysuria.  Musculoskeletal: Negative for joint swelling.  Skin: Negative for rash.  Neurological: Negative for headaches.  Hematological: Does not bruise/bleed easily.  Psychiatric/Behavioral: Negative for dysphoric mood. The patient is not nervous/anxious.        Objective:   Physical Exam Obese female in acute distress Nose without purulent discharge noted No skin breakdown or pressure necrosis from the CPAP mask Neck without lymphadenopathy or thyromegaly Lower extremities with 2-3+ edema, no cyanosis Alert and oriented, moves all 4 extremities.  Does not appear to be sleepy.       Assessment & Plan:

## 2012-07-11 NOTE — Assessment & Plan Note (Signed)
The patient is wearing CPAP fairly compliantly, but is having issues with her humidity and mask leaks which interfere with her usage at times.  She is overdue for a new mask cushion, and I've asked her to keep up with this.  I have also asked her to turn down the heat setting on her humidifier in order to make her water last longer during the night.  Finally, I have stressed to her the importance of aggressive weight loss.  I do not see where we have reset her pressure to a fixed level, and we'll therefore get this information from [facility].

## 2012-08-04 ENCOUNTER — Telehealth: Payer: Self-pay | Admitting: Family Medicine

## 2012-08-04 MED ORDER — HYDROCODONE-HOMATROPINE 5-1.5 MG/5ML PO SYRP: 5.0000 mL | ORAL_SOLUTION | Freq: Three times a day (TID) | ORAL | Status: DC | PRN

## 2012-08-04 NOTE — Telephone Encounter (Signed)
Rx called in per Dr Todd.  Patient is aware. 

## 2012-08-04 NOTE — Telephone Encounter (Signed)
PT calling to request an appt with you, for her head and chest congestion. Please assist.

## 2012-08-07 ENCOUNTER — Ambulatory Visit: Payer: Medicare Other | Admitting: Family Medicine

## 2012-08-07 ENCOUNTER — Ambulatory Visit (INDEPENDENT_AMBULATORY_CARE_PROVIDER_SITE_OTHER): Payer: Medicare Other | Admitting: Family Medicine

## 2012-08-07 ENCOUNTER — Encounter: Payer: Self-pay | Admitting: Family Medicine

## 2012-08-07 ENCOUNTER — Ambulatory Visit (INDEPENDENT_AMBULATORY_CARE_PROVIDER_SITE_OTHER)
Admission: RE | Admit: 2012-08-07 | Discharge: 2012-08-07 | Disposition: A | Discharge status: Home / Self Care | Payer: Medicare Other | Source: Ambulatory Visit | Attending: Family Medicine | Admitting: Family Medicine

## 2012-08-07 VITALS — BP 128/80 | Temp 97.8°F | Wt 256.0 lb

## 2012-08-07 DIAGNOSIS — R05 Cough: Secondary | ICD-10-CM

## 2012-08-07 DIAGNOSIS — R059 Cough, unspecified: Secondary | ICD-10-CM

## 2012-08-07 MED ORDER — HYDROCODONE-HOMATROPINE 5-1.5 MG/5ML PO SYRP: 5.0000 mL | ORAL_SOLUTION | Freq: Three times a day (TID) | ORAL | Status: DC | PRN

## 2012-08-07 MED ORDER — PREDNISONE 20 MG PO TABS: ORAL_TABLET | ORAL | Status: DC

## 2012-08-07 NOTE — Progress Notes (Signed)
  Subjective:    Patient ID: Taylor Mccall, female    DOB: August 15, 1945, 67 y.o.   MRN: [id number]  HPIClaudia is a 67 year old single female nonsmoker who comes in today with a three-week history of cough  She states that she developed a cough starting 3 weeks ago she denies having a Section and she did not get around anything she is allergic to. She has a history of allergic rhinitis and no asthma. She denies any fever chills. She states she's been have some yellow sputum. She's unable to sleep at night because she wakes up coughing and the coughing makes her short of breath.  She said no history of trauma  No history of travel outside the city  No history of contact with anybody has been ill  She's never had a cough like this before    Review of Systems Review of systems otherwise negative    Objective:   Physical Exam Well-developed well-nourished female no acute distress HEENT pertinent and she has a right serous otitis media. Neck was supple no adenopathy. Pulmonary exam shows asymmetric breath sounds. The latter on the left than the right and she has audible wheezing inspiratory and expiratory bilaterally. Because of the asymmetry of her breath sounds she was sent for chest x-ray. Chest x-ray shows no abnormality       Assessment & Plan:  Acute cough with asthma,,,,,,,,,,,,, plan prednisone burst and taper followup in 5 days

## 2012-08-07 NOTE — Patient Instructions (Addendum)
Go directly to the main office now for your chest x-ray  Wait t right there  and they will call me the report on my cell phone,,,,,,,,,,, [phone]  Take the prednisone as directed  Rest at home  Drink lots of water  Hydromet and 1/2-1 teaspoon 3 times a day when necessary  Return on Tuesday for followup

## 2012-08-12 ENCOUNTER — Ambulatory Visit (INDEPENDENT_AMBULATORY_CARE_PROVIDER_SITE_OTHER): Payer: Medicare Other | Admitting: Family Medicine

## 2012-08-12 ENCOUNTER — Encounter: Payer: Self-pay | Admitting: Family Medicine

## 2012-08-12 VITALS — BP 110/70 | HR 91 | Temp 97.7°F | Wt 254.0 lb

## 2012-08-12 DIAGNOSIS — R059 Cough, unspecified: Secondary | ICD-10-CM

## 2012-08-12 DIAGNOSIS — R05 Cough: Secondary | ICD-10-CM

## 2012-08-12 NOTE — Progress Notes (Signed)
  Subjective:    Patient ID: Taylor Mccall, female    DOB: [birth year], 67 y.o.   MRN: [id number]  HPI  Taylor Mccall is a 67 year old female nonsmoker who comes in today for followup of asthma  We saw her recently with a flareup of her asthma. Chest x-ray showed no pneumonia. She was started on prednisone 40 mg daily. This is her fifth day of 40 mg a day and she feels 75% better  Review of Systems Review of systems otherwise negative    Objective:   Physical Exam  Well-developed well-nourished female no acute distress vital signs stable pulse ox 98 on room air lungs are clear to auscultation except for some mild symmetrical late expiratory wheezing on forced expiration      Assessment & Plan:  Asthma resolving slowly plan taper prednisone return when necessary

## 2012-08-12 NOTE — Patient Instructions (Signed)
Prednisone 20 mg,,,,,,,,,,,,,, one half tabs x3 days, 1 tab x3 days, a half a tab x3 days, then a half a tablet Monday Wednesday Friday for a 3 week taper  Return in one month for followup

## 2012-08-17 ENCOUNTER — Other Ambulatory Visit: Payer: Self-pay | Admitting: Pulmonary Disease

## 2012-08-17 DIAGNOSIS — G4733 Obstructive sleep apnea (adult) (pediatric): Secondary | ICD-10-CM

## 2012-09-10 ENCOUNTER — Other Ambulatory Visit: Payer: Self-pay

## 2012-09-11 ENCOUNTER — Ambulatory Visit: Payer: Medicare Other | Admitting: Family Medicine

## 2012-09-11 ENCOUNTER — Encounter: Payer: Self-pay | Admitting: Family

## 2012-09-11 ENCOUNTER — Ambulatory Visit (INDEPENDENT_AMBULATORY_CARE_PROVIDER_SITE_OTHER): Payer: Medicare Other | Admitting: Family

## 2012-09-11 VITALS — BP 116/74 | HR 91 | Wt 263.0 lb

## 2012-09-11 DIAGNOSIS — H9201 Otalgia, right ear: Secondary | ICD-10-CM

## 2012-09-11 DIAGNOSIS — H6691 Otitis media, unspecified, right ear: Secondary | ICD-10-CM

## 2012-09-11 DIAGNOSIS — R059 Cough, unspecified: Secondary | ICD-10-CM

## 2012-09-11 DIAGNOSIS — R05 Cough: Secondary | ICD-10-CM

## 2012-09-11 DIAGNOSIS — H6690 Otitis media, unspecified, unspecified ear: Secondary | ICD-10-CM

## 2012-09-11 DIAGNOSIS — H9209 Otalgia, unspecified ear: Secondary | ICD-10-CM

## 2012-09-11 DIAGNOSIS — K219 Gastro-esophageal reflux disease without esophagitis: Secondary | ICD-10-CM

## 2012-09-11 MED ORDER — OMEPRAZOLE 40 MG PO CPDR: 40.0000 mg | DELAYED_RELEASE_CAPSULE | Freq: Every day | ORAL | Status: DC

## 2012-09-11 MED ORDER — AZITHROMYCIN 250 MG PO TABS: ORAL_TABLET | ORAL | Status: DC

## 2012-09-11 NOTE — Patient Instructions (Addendum)

## 2012-09-11 NOTE — Progress Notes (Signed)
Subjective:    Patient ID: Taylor Mccall, female    DOB: 03-27-1945, 67 y.o.   MRN: [id number]  HPI 67 year old female, nonsmoker, is in today for a recheck of Asthma and cough. She has finished prednisone and is unsure if it helped. Has noticed more swelling in her legs lately. She uses CPAP nightly. She does report a history of GERD with heartburn and indigestion.   Patient also has c/o right ear pain x 2 weeks and worsening. Pain kept her awake   Review of Systems  Constitutional: Negative.   HENT: Positive for ear pain. Negative for congestion, rhinorrhea, sneezing, neck pain and postnasal drip.   Respiratory: Positive for cough. Negative for wheezing.   Cardiovascular: Negative.   Gastrointestinal: Negative.        Heartburn and indigestion  Musculoskeletal: Negative.   Skin: Negative.   Allergic/Immunologic: Negative.   Neurological: Negative.   Hematological: Negative.   Psychiatric/Behavioral: Negative.    Past Medical History  Diagnosis Date  . Obesity   . Osteoarthritis   . Urinary incontinence   . Hypertension   . Depression   . Rheumatoid arthritis(714.0)   . Gait disturbance     History   Social History  . Marital Status: Widowed    Spouse Name: N/A    Number of Children: N/A  . Years of Education: N/A   Occupational History  . Not on file.   Social History Main Topics  . Smoking status: Former Smoker -- 0.30 packs/day for 10 years    Types: Cigarettes    Quit date: 02/05/1978  . Smokeless tobacco: Not on file     Comment: over 25 years ago...pt doesnt remember when she quit.   . Alcohol Use: Yes     Comment: occassional/social  . Drug Use: No  . Sexually Active: Not on file   Other Topics Concern  . Not on file   Social History Narrative  . No narrative on file    Past Surgical History  Procedure Laterality Date  . Bladder suspension  2009  . Total hip arthroplasty    . Vesicovaginal fistula closure w/ tah    . Joint replacement     . Foot surgery      Left great toe    Family History  Problem Relation Age of Onset  . Diabetes    . Stroke    . Asthma Brother   . Coronary artery disease Father   . Coronary artery disease Brother   . Skin cancer Father     Allergies  Allergen Reactions  . Penicillins     REACTION: rash    Current Outpatient Prescriptions on File Prior to Visit  Medication Sig Dispense Refill  . aspirin 81 MG tablet Take 81 mg by mouth daily.        . beta carotene w/minerals (OCUVITE) tablet Take 1 tablet by mouth daily.      . Calcium Carbonate-Vitamin D (SM CALCIUM 600/VITAMIN D PO) Take 1 tablet by mouth daily.      . Cholecalciferol (VITAMIN D-3 PO) Take 1,000 mg by mouth daily.      Marland Kitchen erythromycin (ERY-TAB) 500 MG EC tablet 500 mg. 2 tabs 2 hours prior to dental procedure, and 1 tab 6 hours later      . furosemide (LASIX) 20 MG tablet 2 in the Am,,,,,,, one at noon  300 tablet  3  . HYDROcodone-homatropine (HYCODAN) 5-1.5 MG/5ML syrup Take 5 mLs by mouth every  8 (eight) hours as needed for cough.  240 mL  1  . hydroxychloroquine (PLAQUENIL) 200 MG tablet Take 200 mg by mouth 2 (two) times daily.       . Omega-3 Fatty Acids (FISH OIL) 1200 MG CAPS Take 1,200 mg by mouth daily.      . potassium chloride SA (KLOR-CON M20) 20 MEQ tablet       . predniSONE (DELTASONE) 20 MG tablet 2 tabs x3 days, 1 tab x3 days, a half a tab x3 days, then a half a tab Monday Wednesday Friday for a 3 week taper  50 tablet  1  . sertraline (ZOLOFT) 100 MG tablet TAKE 1 TABLET DAILY  100 tablet  3  . timolol (BETIMOL) 0.25 % ophthalmic solution Apply 1-2 drops to eye 2 (two) times daily.      . vitamin E 400 UNIT capsule Take 400 Units by mouth daily.       No current facility-administered medications on file prior to visit.    BP 116/74  Pulse 91  Wt 263 lb (119.296 kg)  BMI 37.74 kg/m2  SpO2 98%chart    Objective:   Physical Exam  Constitutional: She is oriented to person, place, and time. She  appears well-developed and well-nourished.  HENT:  Left Ear: External ear normal.  Nose: Nose normal.  Mouth/Throat: Oropharynx is clear and moist.  Red bulging tympanic membrane   Neck: Normal range of motion. Neck supple.  Cardiovascular: Normal rate, regular rhythm and normal heart sounds.   Pulmonary/Chest: Effort normal and breath sounds normal.  Abdominal: Soft. Bowel sounds are normal.  Musculoskeletal: Normal range of motion.  Neurological: She is alert and oriented to person, place, and time.  Skin: Skin is warm and dry.  Psychiatric: She has a normal mood and affect.          Assessment & Plan:  Assessment: 1. Asthma 2. Cough-likely related to GERD 3. GERD 4. Otitis Media  Plan: Start Omeprazole 40mg  once daily. Hopefully this will help with cough. zpak as directed once daily. Call the office if symptoms worsen or persist. Recheck as scheduled and as needed.

## 2012-10-09 ENCOUNTER — Ambulatory Visit (INDEPENDENT_AMBULATORY_CARE_PROVIDER_SITE_OTHER): Payer: Medicare Other | Admitting: Family

## 2012-10-09 ENCOUNTER — Encounter: Payer: Self-pay | Admitting: Family

## 2012-10-09 VITALS — BP 118/64 | HR 81 | Wt 259.0 lb

## 2012-10-09 DIAGNOSIS — R05 Cough: Secondary | ICD-10-CM

## 2012-10-09 DIAGNOSIS — R059 Cough, unspecified: Secondary | ICD-10-CM

## 2012-10-09 DIAGNOSIS — K219 Gastro-esophageal reflux disease without esophagitis: Secondary | ICD-10-CM

## 2012-10-09 DIAGNOSIS — R600 Localized edema: Secondary | ICD-10-CM

## 2012-10-09 DIAGNOSIS — M069 Rheumatoid arthritis, unspecified: Secondary | ICD-10-CM

## 2012-10-09 DIAGNOSIS — R609 Edema, unspecified: Secondary | ICD-10-CM

## 2012-10-09 NOTE — Patient Instructions (Addendum)

## 2012-10-09 NOTE — Progress Notes (Signed)
Subjective:    Patient ID: Taylor Mccall, female    DOB: 02/15/1945, 67 y.o.   MRN: [id number]  HPI  67 year old AAf, nonsmoker is in for a recheck of Cough and GERD. At her last OV we began Omeprazole to help with cough. Said she's been taking the medication, she no longer has a cough. Continues to have right ear pain. She saw the dentist last week to have her teeth checked and they were fine. Has a history of osteoarthritis and rheumatoid arthritis. She's had TMJ in the past as well.  Review of Systems  Constitutional: Negative.   HENT: Negative.   Respiratory: Negative.  Negative for cough.   Cardiovascular: Negative.   Gastrointestinal: Negative.   Endocrine: Negative.   Genitourinary: Negative.   Musculoskeletal: Positive for arthralgias.       Right jaw pain  Skin: Negative.   Neurological: Negative.   Hematological: Positive for adenopathy.  Psychiatric/Behavioral: Negative.    Past Medical History  Diagnosis Date  . Obesity   . Osteoarthritis   . Urinary incontinence   . Hypertension   . Depression   . Rheumatoid arthritis(714.0)   . Gait disturbance     History   Social History  . Marital Status: Widowed    Spouse Name: N/A    Number of Children: N/A  . Years of Education: N/A   Occupational History  . Not on file.   Social History Main Topics  . Smoking status: Former Smoker -- 0.30 packs/day for 10 years    Types: Cigarettes    Quit date: 02/05/1978  . Smokeless tobacco: Not on file     Comment: over 25 years ago...pt doesnt remember when she quit.   . Alcohol Use: Yes     Comment: occassional/social  . Drug Use: No  . Sexual Activity: Not on file   Other Topics Concern  . Not on file   Social History Narrative  . No narrative on file    Past Surgical History  Procedure Laterality Date  . Bladder suspension  2009  . Total hip arthroplasty    . Vesicovaginal fistula closure w/ tah    . Joint replacement    . Foot surgery      Left  great toe    Family History  Problem Relation Age of Onset  . Diabetes    . Stroke    . Asthma Brother   . Coronary artery disease Father   . Coronary artery disease Brother   . Skin cancer Father     Allergies  Allergen Reactions  . Penicillins     REACTION: rash    Current Outpatient Prescriptions on File Prior to Visit  Medication Sig Dispense Refill  . aspirin 81 MG tablet Take 81 mg by mouth daily.        . beta carotene w/minerals (OCUVITE) tablet Take 1 tablet by mouth daily.      . Calcium Carbonate-Vitamin D (SM CALCIUM 600/VITAMIN D PO) Take 1 tablet by mouth daily.      . Cholecalciferol (VITAMIN D-3 PO) Take 1,000 mg by mouth daily.      Marland Kitchen erythromycin (ERY-TAB) 500 MG EC tablet 500 mg. 2 tabs 2 hours prior to dental procedure, and 1 tab 6 hours later      . furosemide (LASIX) 20 MG tablet 2 in the Am,,,,,,, one at noon  300 tablet  3  . HYDROcodone-homatropine (HYCODAN) 5-1.5 MG/5ML syrup Take 5 mLs by mouth every  8 (eight) hours as needed for cough.  240 mL  1  . hydroxychloroquine (PLAQUENIL) 200 MG tablet Take 200 mg by mouth 2 (two) times daily.       . Omega-3 Fatty Acids (FISH OIL) 1200 MG CAPS Take 1,200 mg by mouth daily.      Marland Kitchen omeprazole (PRILOSEC) 40 MG capsule Take 1 capsule (40 mg total) by mouth daily.  30 capsule  3  . potassium chloride SA (KLOR-CON M20) 20 MEQ tablet       . predniSONE (DELTASONE) 20 MG tablet 2 tabs x3 days, 1 tab x3 days, a half a tab x3 days, then a half a tab Monday Wednesday Friday for a 3 week taper  50 tablet  1  . sertraline (ZOLOFT) 100 MG tablet TAKE 1 TABLET DAILY  100 tablet  3  . timolol (BETIMOL) 0.25 % ophthalmic solution Apply 1-2 drops to eye 2 (two) times daily.      . vitamin E 400 UNIT capsule Take 400 Units by mouth daily.      Marland Kitchen azithromycin (ZITHROMAX) 250 MG tablet 2 tabs today, then 1 tab a day x 4 more days.  6 tablet  0   No current facility-administered medications on file prior to visit.    BP 118/64   Pulse 81  Wt 259 lb (117.482 kg)  BMI 37.16 kg/m2chart    Objective:   Physical Exam  Constitutional: She is oriented to person, place, and time. She appears well-developed and well-nourished.  HENT:  Right Ear: External ear normal.  Left Ear: External ear normal.  Nose: Nose normal.  Mouth/Throat: Oropharynx is clear and moist.  Neck: Normal range of motion. Neck supple.  Cardiovascular: Normal rate, regular rhythm and normal heart sounds.   Pulmonary/Chest: Effort normal and breath sounds normal.  Abdominal: Soft. Bowel sounds are normal.  Musculoskeletal: Normal range of motion.  Neurological: She is alert and oriented to person, place, and time. She has normal reflexes.  Skin: Skin is warm and dry.  Psychiatric: She has a normal mood and affect.          Assessment & Plan:  Assessment: 1. GERD-improved 2. Cough-improved 3. TMJ 4. Osteoarthritis.  Plan: Continue all meds Lozol. Aleve as needed for TMJ arthritis. Kandis Nab office with any questions or concerns recheck as scheduled and sooner as needed.

## 2012-12-11 ENCOUNTER — Other Ambulatory Visit: Payer: Self-pay

## 2013-03-10 ENCOUNTER — Other Ambulatory Visit (INDEPENDENT_AMBULATORY_CARE_PROVIDER_SITE_OTHER): Payer: Medicare HMO

## 2013-03-10 DIAGNOSIS — Z0000 Encounter for general adult medical examination without abnormal findings: Secondary | ICD-10-CM

## 2013-03-10 DIAGNOSIS — I10 Essential (primary) hypertension: Secondary | ICD-10-CM

## 2013-03-10 DIAGNOSIS — I872 Venous insufficiency (chronic) (peripheral): Secondary | ICD-10-CM

## 2013-03-10 DIAGNOSIS — E669 Obesity, unspecified: Secondary | ICD-10-CM

## 2013-03-10 LAB — CBC WITH DIFFERENTIAL/PLATELET
BASOS ABS: 0 10*3/uL (ref 0.0–0.1)
Basophils Relative: 0.5 % (ref 0.0–3.0)
EOS ABS: 0.3 10*3/uL (ref 0.0–0.7)
EOS PCT: 6.8 % — AB (ref 0.0–5.0)
HEMATOCRIT: 38.1 % (ref 36.0–46.0)
Hemoglobin: 12.5 g/dL (ref 12.0–15.0)
LYMPHS ABS: 1.1 10*3/uL (ref 0.7–4.0)
Lymphocytes Relative: 27.3 % (ref 12.0–46.0)
MCHC: 32.8 g/dL (ref 30.0–36.0)
MCV: 89 fl (ref 78.0–100.0)
MONO ABS: 0.3 10*3/uL (ref 0.1–1.0)
Monocytes Relative: 8.1 % (ref 3.0–12.0)
NEUTROS PCT: 57.3 % (ref 43.0–77.0)
Neutro Abs: 2.3 10*3/uL (ref 1.4–7.7)
PLATELETS: 228 10*3/uL (ref 150.0–400.0)
RBC: 4.29 Mil/uL (ref 3.87–5.11)
RDW: 13.3 % (ref 11.5–14.6)
WBC: 4 10*3/uL — AB (ref 4.5–10.5)

## 2013-03-10 LAB — HEPATIC FUNCTION PANEL
ALT: 11 U/L (ref 0–35)
AST: 16 U/L (ref 0–37)
Albumin: 3.7 g/dL (ref 3.5–5.2)
Alkaline Phosphatase: 102 U/L (ref 39–117)
BILIRUBIN DIRECT: 0.1 mg/dL (ref 0.0–0.3)
BILIRUBIN TOTAL: 1.1 mg/dL (ref 0.3–1.2)
Total Protein: 6.8 g/dL (ref 6.0–8.3)

## 2013-03-10 LAB — POCT URINALYSIS DIPSTICK
BILIRUBIN UA: NEGATIVE
GLUCOSE UA: NEGATIVE
Nitrite, UA: POSITIVE
SPEC GRAV UA: 1.025
UROBILINOGEN UA: 0.2
pH, UA: 5.5

## 2013-03-10 LAB — BASIC METABOLIC PANEL
BUN: 12 mg/dL (ref 6–23)
CHLORIDE: 106 meq/L (ref 96–112)
CO2: 27 mEq/L (ref 19–32)
CREATININE: 0.7 mg/dL (ref 0.4–1.2)
Calcium: 9.4 mg/dL (ref 8.4–10.5)
GFR: 99.95 mL/min (ref >60.00)
GLUCOSE: 81 mg/dL (ref 70–99)
POTASSIUM: 3.4 meq/L — AB (ref 3.5–5.1)
Sodium: 140 mEq/L (ref 135–145)

## 2013-03-10 LAB — LIPID PANEL
CHOLESTEROL: 175 mg/dL (ref 0–200)
HDL: 57.6 mg/dL (ref >39.00)
LDL CALC: 105 mg/dL — AB (ref 0–99)
TRIGLYCERIDES: 62 mg/dL (ref 0.0–149.0)
Total CHOL/HDL Ratio: 3
VLDL: 12.4 mg/dL (ref 0.0–40.0)

## 2013-03-10 LAB — TSH: TSH: 2.11 u[IU]/mL (ref 0.35–5.50)

## 2013-03-18 ENCOUNTER — Ambulatory Visit (INDEPENDENT_AMBULATORY_CARE_PROVIDER_SITE_OTHER): Payer: Medicare HMO | Admitting: Family Medicine

## 2013-03-18 ENCOUNTER — Encounter: Payer: Self-pay | Admitting: Family Medicine

## 2013-03-18 VITALS — BP 120/80 | Temp 97.6°F | Wt 245.0 lb

## 2013-03-18 DIAGNOSIS — K219 Gastro-esophageal reflux disease without esophagitis: Secondary | ICD-10-CM

## 2013-03-18 DIAGNOSIS — F329 Major depressive disorder, single episode, unspecified: Secondary | ICD-10-CM

## 2013-03-18 DIAGNOSIS — F3289 Other specified depressive episodes: Secondary | ICD-10-CM

## 2013-03-18 DIAGNOSIS — E669 Obesity, unspecified: Secondary | ICD-10-CM

## 2013-03-18 DIAGNOSIS — G4733 Obstructive sleep apnea (adult) (pediatric): Secondary | ICD-10-CM

## 2013-03-18 DIAGNOSIS — M1990 Unspecified osteoarthritis, unspecified site: Secondary | ICD-10-CM

## 2013-03-18 DIAGNOSIS — I872 Venous insufficiency (chronic) (peripheral): Secondary | ICD-10-CM

## 2013-03-18 DIAGNOSIS — R32 Unspecified urinary incontinence: Secondary | ICD-10-CM

## 2013-03-18 DIAGNOSIS — Z23 Encounter for immunization: Secondary | ICD-10-CM

## 2013-03-18 DIAGNOSIS — I10 Essential (primary) hypertension: Secondary | ICD-10-CM

## 2013-03-18 MED ORDER — POTASSIUM CHLORIDE CRYS ER 20 MEQ PO TBCR: EXTENDED_RELEASE_TABLET | ORAL | Status: DC

## 2013-03-18 MED ORDER — OMEPRAZOLE 40 MG PO CPDR: 40.0000 mg | DELAYED_RELEASE_CAPSULE | Freq: Every day | ORAL | Status: DC

## 2013-03-18 MED ORDER — FUROSEMIDE 20 MG PO TABS: ORAL_TABLET | ORAL | Status: DC

## 2013-03-18 MED ORDER — SERTRALINE HCL 100 MG PO TABS: ORAL_TABLET | ORAL | Status: DC

## 2013-03-18 NOTE — Patient Instructions (Signed)
Continue your current medications  Remember the things we talked about for the venous insufficiency  Return in one year sooner if any problems  Go to the [facility] 3 times weekly

## 2013-03-18 NOTE — Progress Notes (Signed)
   Subjective:    Patient ID: Taylor Mccall, female    DOB: Feb 05, 1946, 68 y.o.   MRN: [id number]  HPI Taylor Mccall is a 68 year old female who comes in today for general Medicare wellness examination  She has a history of sleep apnea and sees Dr. Lupita Leash. In pulmonary  She has a history of obesity venous insufficiency chronic joint pain gait instability depression hypertension and urinary incontinence  Her med list reviewed in detail and there've been no changes  She gets routine eye care, dental care, BSE sporadically, and you mammography, recent colonoscopy normal  Cognitive function normal she does not walk on a regular basis now she is home all the time because she's caring for her 42 year old mother who just had surgery for cervical cancer.     Review of Systems  Constitutional: Negative.   HENT: Negative.   Eyes: Negative.   Respiratory: Negative.   Cardiovascular: Negative.   Gastrointestinal: Negative.   Endocrine: Negative.   Genitourinary: Negative.   Musculoskeletal: Negative.   Allergic/Immunologic: Negative.   Neurological: Negative.   Hematological: Negative.   Psychiatric/Behavioral: Negative.        Objective:   Physical Exam  Nursing note and vitals reviewed. Constitutional: She is oriented to person, place, and time. She appears well-developed and well-nourished.  HENT:  Head: Normocephalic and atraumatic.  Right Ear: External ear normal.  Left Ear: External ear normal.  Nose: Nose normal.  Mouth/Throat: Oropharynx is clear and moist.  Eyes: EOM are normal. Pupils are equal, round, and reactive to light.  Neck: Normal range of motion. Neck supple. No thyromegaly present.  Cardiovascular: Normal rate, regular rhythm, normal heart sounds and intact distal pulses.  Exam reveals no gallop and no friction rub.   No murmur heard. No carotid or aortic bruits peripheral pulses 1+ and symmetrical  Chronic venous insufficiency lower edema  Pulmonary/Chest:  Effort normal and breath sounds normal.  Abdominal: Soft. Bowel sounds are normal. She exhibits no distension and no mass. There is no tenderness. There is no rebound and no guarding.  Genitourinary:  Bilateral breast exam normal except for scars from previous reduction mammoplasty .........Marland KitchenMarland KitchenShe had her uterus removed many years ago ovaries were left intact she's asymptomatic GYN-wise therefore pelvic not indicated  Musculoskeletal: Normal range of motion.  Lymphadenopathy:    She has no cervical adenopathy.  Neurological: She is alert and oriented to person, place, and time. She has normal reflexes. No cranial nerve deficit. She exhibits normal muscle tone. Coordination normal.  Skin: Skin is warm and dry.  Skin exam normal except for scar left knee where she had a total knee replacement  Psychiatric: She has a normal mood and affect. Her behavior is normal. Judgment and thought content normal.          Assessment & Plan:  Healthy female  Urinary incontinence  Severe degenerative osteoarthritis status post left total knee replacement followed by rheumatology  Hypertension at goal continue current medications  Depression continue current medications  Chronic venous insufficiency discussed diuretic use elevation and full leg stockings  Obesity unchanged  Sleep apnea followup in pulmonary

## 2013-03-19 ENCOUNTER — Telehealth: Payer: Self-pay | Admitting: Family Medicine

## 2013-03-19 NOTE — Telephone Encounter (Signed)
Relevant patient education assigned to patient using Emmi. ° °

## 2013-07-08 ENCOUNTER — Telehealth: Payer: Self-pay | Admitting: Family Medicine

## 2013-07-08 MED ORDER — SULFAMETHOXAZOLE-TMP DS 800-160 MG PO TABS: 1.0000 | ORAL_TABLET | Freq: Two times a day (BID) | ORAL | Status: DC

## 2013-07-08 NOTE — Telephone Encounter (Signed)
Spoke with patient and she does not have dysuria but does have polyuria.  Septra DS twice daily for 10 days sent to pharmacy per Dr Sherren Mocha.

## 2013-07-08 NOTE — Telephone Encounter (Signed)
Pt states she was dx with a uti on Monday by the rheumatologist dr, and was informed to contact dr. Sherren Mocha to have him send in a rx for it. Send to [facility] on [address].

## 2013-07-13 ENCOUNTER — Encounter: Payer: Self-pay | Admitting: Pulmonary Disease

## 2013-07-13 ENCOUNTER — Ambulatory Visit (INDEPENDENT_AMBULATORY_CARE_PROVIDER_SITE_OTHER): Payer: Medicare HMO | Admitting: Pulmonary Disease

## 2013-07-13 VITALS — BP 112/62 | HR 74 | Temp 98.1°F | Ht 70.0 in | Wt 245.4 lb

## 2013-07-13 DIAGNOSIS — G4733 Obstructive sleep apnea (adult) (pediatric): Secondary | ICD-10-CM

## 2013-07-13 NOTE — Progress Notes (Signed)
   Subjective:    Patient ID: Taylor Mccall, female    DOB: 1945-03-05, 68 y.o.   MRN: [id number]  HPI The patient comes in today for followup of her obstructive sleep apnea. She has not been wearing CPAP as compliantly recently, because she is caring for her elderly mother at home during the night. She is having no issues with her mask fit, but is having a lot of mouth dryness. She has not tried to turn up the heat on her heated humidifier. She has lost significant weight since her visit a year ago. She is satisfied with her daytime alertness currently.   Review of Systems  Constitutional: Negative for fever and unexpected weight change.  HENT: Negative for congestion, dental problem, ear pain, nosebleeds, postnasal drip, rhinorrhea, sinus pressure, sneezing, sore throat and trouble swallowing.   Eyes: Negative for redness and itching.  Respiratory: Negative for cough, chest tightness, shortness of breath and wheezing.   Cardiovascular: Negative for palpitations and leg swelling.  Gastrointestinal: Negative for nausea and vomiting.  Genitourinary: Negative for dysuria.  Musculoskeletal: Negative for joint swelling.  Skin: Negative for rash.  Neurological: Negative for headaches.  Hematological: Does not bruise/bleed easily.  Psychiatric/Behavioral: Negative for dysphoric mood. The patient is not nervous/anxious.        Objective:   Physical Exam Overweight female in no acute distress Nose without purulence or discharge noted Neck without lymphadenopathy or thyromegaly No skin breakdown or pressure necrosis from the CPAP mask Lower extremities with 1-2+ edema, no cyanosis Alert and oriented, moves all 4 extremities       Assessment & Plan:

## 2013-07-13 NOTE — Patient Instructions (Addendum)
Will send an order to [facility] to see if you are eligible for a new machine. Keep working on weight loss Turn heat up on humidifier to get more moisture followup with me again in one year.

## 2013-07-13 NOTE — Assessment & Plan Note (Signed)
The patient continues to do well with CPAP whenever she wears the device, and I have asked her to try and wear more compliantly. She is having no issues with mask fit or pressure, but she is having dryness. I have asked her to try and increase the heat on her humidifier. I've also encouraged her to keep working on weight loss, and that she is doing well.

## 2013-07-15 ENCOUNTER — Telehealth: Payer: Self-pay | Admitting: Pulmonary Disease

## 2013-07-15 DIAGNOSIS — G4733 Obstructive sleep apnea (adult) (pediatric): Secondary | ICD-10-CM

## 2013-07-15 NOTE — Telephone Encounter (Signed)
Ok to send for resmed s10 air/auto with h/h and climate control tubing.  Set on same pressure, and enroll in [place].

## 2013-07-15 NOTE — Telephone Encounter (Signed)
Per OV 07/13/13: Patient Instructions      Will send an order to [facility] to see if you are eligible for a new machine. Keep working on weight loss Turn heat up on humidifier to get more moisture followup with me again in one year.      ---  Please advise [place] thanks

## 2013-07-17 NOTE — Telephone Encounter (Signed)
Order placed and staff message sent to [facility]

## 2013-07-20 ENCOUNTER — Encounter (HOSPITAL_COMMUNITY): Payer: Self-pay | Admitting: Emergency Medicine

## 2013-07-20 ENCOUNTER — Telehealth: Payer: Self-pay | Admitting: Family Medicine

## 2013-07-20 ENCOUNTER — Emergency Department (HOSPITAL_COMMUNITY)
Admission: EM | Admit: 2013-07-20 | Discharge: 2013-07-20 | Disposition: A | Discharge status: Home / Self Care | Payer: Medicare HMO | Attending: Emergency Medicine | Admitting: Emergency Medicine

## 2013-07-20 DIAGNOSIS — M1990 Unspecified osteoarthritis, unspecified site: Secondary | ICD-10-CM | POA: Insufficient documentation

## 2013-07-20 DIAGNOSIS — Z79899 Other long term (current) drug therapy: Secondary | ICD-10-CM | POA: Insufficient documentation

## 2013-07-20 DIAGNOSIS — Z7982 Long term (current) use of aspirin: Secondary | ICD-10-CM | POA: Insufficient documentation

## 2013-07-20 DIAGNOSIS — F3289 Other specified depressive episodes: Secondary | ICD-10-CM | POA: Insufficient documentation

## 2013-07-20 DIAGNOSIS — I10 Essential (primary) hypertension: Secondary | ICD-10-CM | POA: Insufficient documentation

## 2013-07-20 DIAGNOSIS — R1110 Vomiting, unspecified: Secondary | ICD-10-CM

## 2013-07-20 DIAGNOSIS — M069 Rheumatoid arthritis, unspecified: Secondary | ICD-10-CM | POA: Insufficient documentation

## 2013-07-20 DIAGNOSIS — R42 Dizziness and giddiness: Secondary | ICD-10-CM

## 2013-07-20 DIAGNOSIS — Z87891 Personal history of nicotine dependence: Secondary | ICD-10-CM | POA: Insufficient documentation

## 2013-07-20 DIAGNOSIS — Z880 Allergy status to penicillin: Secondary | ICD-10-CM | POA: Insufficient documentation

## 2013-07-20 DIAGNOSIS — F329 Major depressive disorder, single episode, unspecified: Secondary | ICD-10-CM | POA: Insufficient documentation

## 2013-07-20 LAB — COMPREHENSIVE METABOLIC PANEL
ALBUMIN: 3.9 g/dL (ref 3.5–5.2)
ALK PHOS: 116 U/L (ref 39–117)
ALT: 15 U/L (ref 0–35)
AST: 16 U/L (ref 0–37)
BUN: 13 mg/dL (ref 6–23)
CO2: 25 mEq/L (ref 19–32)
Calcium: 9.8 mg/dL (ref 8.4–10.5)
Chloride: 100 mEq/L (ref 96–112)
Creatinine, Ser: 0.78 mg/dL (ref 0.50–1.10)
GFR calc Af Amer: >90 mL/min (ref >90)
GFR calc non Af Amer: 83 mL/min — ABNORMAL LOW (ref >90)
Glucose, Bld: 94 mg/dL (ref 70–99)
Potassium: 4.2 mEq/L (ref 3.7–5.3)
SODIUM: 137 meq/L (ref 137–147)
TOTAL PROTEIN: 7.6 g/dL (ref 6.0–8.3)
Total Bilirubin: 0.7 mg/dL (ref 0.3–1.2)

## 2013-07-20 LAB — URINE MICROSCOPIC-ADD ON

## 2013-07-20 LAB — CBC WITH DIFFERENTIAL/PLATELET
Basophils Absolute: 0.1 10*3/uL (ref 0.0–0.1)
Basophils Relative: 1 % (ref 0–1)
EOS ABS: 0.1 10*3/uL (ref 0.0–0.7)
Eosinophils Relative: 2 % (ref 0–5)
HEMATOCRIT: 39 % (ref 36.0–46.0)
HEMOGLOBIN: 13.2 g/dL (ref 12.0–15.0)
Lymphocytes Relative: 20 % (ref 12–46)
Lymphs Abs: 1.2 10*3/uL (ref 0.7–4.0)
MCH: 29 pg (ref 26.0–34.0)
MCHC: 33.8 g/dL (ref 30.0–36.0)
MCV: 85.7 fL (ref 78.0–100.0)
MONOS PCT: 5 % (ref 3–12)
Monocytes Absolute: 0.3 10*3/uL (ref 0.1–1.0)
Neutro Abs: 4.3 10*3/uL (ref 1.7–7.7)
Neutrophils Relative %: 72 % (ref 43–77)
Platelets: 284 10*3/uL (ref 150–400)
RBC: 4.55 MIL/uL (ref 3.87–5.11)
RDW: 12.8 % (ref 11.5–15.5)
WBC: 6 10*3/uL (ref 4.0–10.5)

## 2013-07-20 LAB — URINALYSIS, ROUTINE W REFLEX MICROSCOPIC
Bilirubin Urine: NEGATIVE
GLUCOSE, UA: NEGATIVE mg/dL
HGB URINE DIPSTICK: NEGATIVE
KETONES UR: NEGATIVE mg/dL
Nitrite: NEGATIVE
PH: 6 (ref 5.0–8.0)
Protein, ur: NEGATIVE mg/dL
Specific Gravity, Urine: 1.025 (ref 1.005–1.030)
Urobilinogen, UA: 1 mg/dL (ref 0.0–1.0)

## 2013-07-20 LAB — TROPONIN I: Troponin I: <0.3 ng/mL (ref <0.30)

## 2013-07-20 LAB — LIPASE, BLOOD: LIPASE: 14 U/L (ref 11–59)

## 2013-07-20 MED ORDER — SODIUM CHLORIDE 0.9 % IV BOLUS (SEPSIS)
1000.0000 mL | Freq: Once | INTRAVENOUS | Status: AC
  Administered 2013-07-20: 1000 mL via INTRAVENOUS

## 2013-07-20 MED ORDER — ONDANSETRON HCL 4 MG PO TABS: 4.0000 mg | ORAL_TABLET | Freq: Four times a day (QID) | ORAL | Status: DC

## 2013-07-20 MED ORDER — ONDANSETRON HCL 4 MG/2ML IJ SOLN
4.0000 mg | Freq: Once | INTRAMUSCULAR | Status: AC
  Administered 2013-07-20: 4 mg via INTRAVENOUS
  Filled 2013-07-20: qty 2

## 2013-07-20 NOTE — ED Notes (Signed)
Pt states woke up this morning with dizziness.  Soon after became nauseated and vomited x 3.  Unknown for fever.  Abdomen has slight burning feeling.  Not able to keep food/water down at all.

## 2013-07-20 NOTE — Discharge Instructions (Signed)
Nausea and Vomiting Nausea is a sick feeling that often comes before throwing up (vomiting). Vomiting is a reflex where stomach contents come out of your mouth. Vomiting can cause severe loss of body fluids (dehydration). Children and elderly adults can become dehydrated quickly, especially if they also have diarrhea. Nausea and vomiting are symptoms of a condition or disease. It is important to find the cause of your symptoms. CAUSES   Direct irritation of the stomach lining. This irritation can result from increased acid production (gastroesophageal reflux disease), infection, food poisoning, taking certain medicines (such as nonsteroidal anti-inflammatory drugs), alcohol use, or tobacco use.  Signals from the brain.These signals could be caused by a headache, heat exposure, an inner ear disturbance, increased pressure in the brain from injury, infection, a tumor, or a concussion, pain, emotional stimulus, or metabolic problems.  An obstruction in the gastrointestinal tract (bowel obstruction).  Illnesses such as diabetes, hepatitis, gallbladder problems, appendicitis, kidney problems, cancer, sepsis, atypical symptoms of a heart attack, or eating disorders.  Medical treatments such as chemotherapy and radiation.  Receiving medicine that makes you sleep (general anesthetic) during surgery. DIAGNOSIS Your caregiver may ask for tests to be done if the problems do not improve after a few days. Tests may also be done if symptoms are severe or if the reason for the nausea and vomiting is not clear. Tests may include:  Urine tests.  Blood tests.  Stool tests.  Cultures (to look for evidence of infection).  X-rays or other imaging studies. Test results can help your caregiver make decisions about treatment or the need for additional tests. TREATMENT You need to stay well hydrated. Drink frequently but in small amounts.You may wish to drink water, sports drinks, clear broth, or eat frozen  ice pops or gelatin dessert to help stay hydrated.When you eat, eating slowly may help prevent nausea.There are also some antinausea medicines that may help prevent nausea. HOME CARE INSTRUCTIONS   Take all medicine as directed by your caregiver.  If you do not have an appetite, do not force yourself to eat. However, you must continue to drink fluids.  If you have an appetite, eat a normal diet unless your caregiver tells you differently.  Eat a variety of complex carbohydrates (rice, wheat, potatoes, bread), lean meats, yogurt, fruits, and vegetables.  Avoid high-fat foods because they are more difficult to digest.  Drink enough water and fluids to keep your urine clear or pale yellow.  If you are dehydrated, ask your caregiver for specific rehydration instructions. Signs of dehydration may include:  Severe thirst.  Dry lips and mouth.  Dizziness.  Dark urine.  Decreasing urine frequency and amount.  Confusion.  Rapid breathing or pulse. SEEK IMMEDIATE MEDICAL CARE IF:   You have blood or brown flecks (like coffee grounds) in your vomit.  You have black or bloody stools.  You have a severe headache or stiff neck.  You are confused.  You have severe abdominal pain.  You have chest pain or trouble breathing.  You do not urinate at least once every 8 hours.  You develop cold or clammy skin.  You continue to vomit for longer than 24 to 48 hours.  You have a fever. MAKE SURE YOU:   Understand these instructions.  Will watch your condition.  Will get help right away if you are not doing well or get worse. Document Released: 01/22/2005 Document Revised: 04/16/2011 Document Reviewed: 06/21/2010 [facility] Patient Information 2014 [place], [place]. Dehydration, Elderly Dehydration is when  you lose more fluids from the body than you take in. Vital organs such as the kidneys, brain, and heart cannot function without a proper amount of fluids and salt. Any loss of  fluids from the body can cause dehydration.  Older adults are at a higher risk of dehydration than younger adults. As we age, our bodies are less able to conserve water and do not respond to temperature changes as well. Also, older adults do not become thirsty as easily or quickly. Because of this, older adults often do not realize they need to increase fluids to avoid dehydration.  CAUSES   Vomiting.  Diarrhea.  Excessive sweating.  Excessive urination.  Fever.  Certain medicines, such as blood pressure medicines called diuretics.  Poorly controlled blood sugars. SIGNS AND SYMPTOMS  Mild dehydration:  Thirst.  Dry lips.  Slightly dry mouth. Moderate dehydration:  Very dry mouth.  Sunken eyes.  Skin does not bounce back quickly when lightly pinched and released.  Dark urine and decreased urine production.  Decreased tear production.  Headache. Severe dehydration:  Very dry mouth.  Extreme thirst.  Rapid, weak pulse (more than 100 beats per minute at rest).  Cold hands and feet.  Not able to sweat in spite of heat.  Rapid breathing.  Blue lips.  Confusion and lethargy.  Difficulty being awakened.  Minimal urine production.  No tears. DIAGNOSIS  Your health care provider will diagnose dehydration based on your symptoms and your exam. Blood and urine tests will help confirm the diagnosis. The diagnostic evaluation should also identify the cause of dehydration. TREATMENT  Treatment of mild or moderate dehydration can often be done at home by increasing the amount of fluids that you drink. It is best to drink small amounts of fluid more often. Drinking too much at one time can make vomiting worse. Severe dehydration needs to be treated at the hospital. You may be given IV fluids that contain water and electrolytes. HOME CARE INSTRUCTIONS   Ask your health care provider about specific rehydration instructions.  Drink enough fluids to keep your urine  clear or pale yellow.  Drink small amounts frequently if you have nausea and vomiting.  Eat as you normally do.  Avoid:  Foods or drinks high in sugar.  Carbonated drinks.  Juice.  Extremely hot or cold fluids.  Drinks with caffeine.  Fatty, greasy foods.  Alcohol.  Tobacco.  Overeating.  Gelatin desserts.  Wash your hands well to avoid spreading bacteria and viruses.  Only take over-the-counter or prescription medicines for pain, discomfort, or fever as directed by your health care provider.  Ask your health care provider if you should continue all prescribed and over-the-counter medicines.  Keep all follow-up appointments with your health care provider. SEEK MEDICAL CARE IF:  You have abdominal pain, and it increases or stays in one area (localizes).  You have a rash, stiff neck, or severe headache.  You are irritable, sleepy, or difficult to awaken.  You are weak, dizzy, or extremely thirsty. SEEK IMMEDIATE MEDICAL CARE IF:   You are unable to keep fluids down, or you get worse despite treatment.  You have frequent episodes of vomiting or diarrhea.  You have blood or green matter (bile) in your vomit.  You have blood in your stool, or your stool looks black and tarry.  You have not urinated in 6 8 hours, or you have only urinated a small amount of very dark urine.  You have a fever.  You faint.  MAKE SURE YOU:   Understand these instructions.  Will watch your condition.  Will get help right away if you are not doing well or get worse. Document Released: 04/14/2003 Document Revised: 11/12/2012 Document Reviewed: 09/29/2012 [facility] Patient Information 2014 [place].

## 2013-07-20 NOTE — Telephone Encounter (Signed)
Patient Information:  Caller Name: Taylor Mccall  Phone: [phone]  Patient: Taylor Mccall, Taylor Mccall  Gender: Female  DOB: 03-03-1945  Age: 68 Years  PCP: Stevie Kern [facility])  Office Follow Up:  Does the office need to follow up with this patient?: No  Instructions For The Office: N/A   Symptoms  Reason For Call & Symptoms: Pt reports she has vomiting and dizziness.  Reviewed Health History In EMR: Yes  Reviewed Medications In EMR: Yes  Reviewed Allergies In EMR: Yes  Reviewed Surgeries / Procedures: Yes  Date of Onset of Symptoms: 07/20/2013  Guideline(s) Used:  Vomiting  Disposition Per Guideline:   Go to ED Now  Reason For Disposition Reached:   Moderate vomiting (e.g., 3 - 5 times/day) and age > 27  Advice Given:  N/A  Patient Will Follow Care Advice:  YES

## 2013-07-20 NOTE — ED Provider Notes (Signed)
TIME SEEN: 8:17 PM  CHIEF COMPLAINT: Lightheadedness, vomiting  HPI: Patient is a 68 year old female with history of hypertension, rheumatoid arthritis, depression who presents the emergency department after she woke up this morning feeling very lightheaded. She denies feeling any vertiginous symptoms. She states her lightheadedness is worse with standing upright. She became nauseous afterwards and vomited x3. No diarrhea. No fever. No chest pain or shortness of breath. No abdominal pain. No ear pain, tinnitus or hearing loss. No numbness or focal weakness.  ROS: See HPI Constitutional: no fever  Eyes: no drainage  ENT: no runny nose   Cardiovascular:  no chest pain  Resp: no SOB  GI: no vomiting GU: no dysuria Integumentary: no rash  Allergy: no hives  Musculoskeletal: no leg swelling  Neurological: no slurred speech ROS otherwise negative  PAST MEDICAL HISTORY/PAST SURGICAL HISTORY:  Past Medical History  Diagnosis Date  . Obesity   . Osteoarthritis   . Urinary incontinence   . Hypertension   . Depression   . Rheumatoid arthritis(714.0)   . Gait disturbance     MEDICATIONS:  Prior to Admission medications   Medication Sig Start Date End Date Taking? Authorizing Provider  aspirin 81 MG tablet Take 81 mg by mouth daily.     Yes Historical Provider, MD  Calcium Carbonate-Vitamin D (SM CALCIUM 600/VITAMIN D PO) Take 1 tablet by mouth daily.   Yes Historical Provider, MD  Cholecalciferol (VITAMIN D-3 PO) Take 1,000 mg by mouth daily.   Yes Historical Provider, MD  furosemide (LASIX) 20 MG tablet 2 in the Am,,,,,,, 2 at noon 03/18/13  Yes Dorena Cookey, MD  hydroxychloroquine (PLAQUENIL) 200 MG tablet Take 200 mg by mouth 2 (two) times daily.    Yes Historical Provider, MD  naproxen (NAPROSYN) 250 MG tablet Take 500 mg by mouth 2 (two) times daily as needed (knee pain).   Yes Historical Provider, MD  Omega-3 Fatty Acids (FISH OIL) 1200 MG CAPS Take 1,200 mg by mouth daily.   Yes  Historical Provider, MD  potassium chloride SA (KLOR-CON M20) 20 MEQ tablet 2 by mouth every morning 03/18/13  Yes Dorena Cookey, MD  sertraline (ZOLOFT) 100 MG tablet TAKE 1 TABLET DAILY 03/18/13  Yes Dorena Cookey, MD  timolol (BETIMOL) 0.25 % ophthalmic solution Apply 1-2 drops to eye 2 (two) times daily.   Yes Historical Provider, MD  vitamin E 400 UNIT capsule Take 400 Units by mouth daily.   Yes Historical Provider, MD    ALLERGIES:  Allergies  Allergen Reactions  . Penicillins     REACTION: rash    SOCIAL HISTORY:  History  Substance Use Topics  . Smoking status: Former Smoker -- 0.30 packs/day for 10 years    Types: Cigarettes    Quit date: 02/05/1978  . Smokeless tobacco: Not on file     Comment: over 25 years ago...pt doesnt remember when she quit.   . Alcohol Use: Yes     Comment: occassional/social    FAMILY HISTORY: Family History  Problem Relation Age of Onset  . Diabetes    . Stroke    . Asthma Brother   . Coronary artery disease Father   . Coronary artery disease Brother   . Skin cancer Father     EXAM: BP 150/75  Pulse 63  Temp(Src) 98.3 F (36.8 C) (Oral)  Resp 20  SpO2 100% CONSTITUTIONAL: Alert and oriented and responds appropriately to questions. Well-appearing; well-nourished HEAD: Normocephalic EYES: Conjunctivae clear, PERRL ENT:  normal nose; no rhinorrhea; moist mucous membranes; pharynx without lesions noted NECK: Supple, no meningismus, no LAD  CARD: RRR; S1 and S2 appreciated; no murmurs, no clicks, no rubs, no gallops RESP: Normal chest excursion without splinting or tachypnea; breath sounds clear and equal bilaterally; no wheezes, no rhonchi, no rales,  ABD/GI: Normal bowel sounds; non-distended; soft, non-tender, no rebound, no guarding BACK:  The back appears normal and is non-tender to palpation, there is no CVA tenderness EXT: Normal ROM in all joints; non-tender to palpation; no edema; normal capillary refill; no cyanosis     SKIN: Normal color for age and race; warm NEURO: Moves all extremities equally, sensation to light touch intact diffusely, cranial nerves II through XII intact, patient able to ambulate with walker which is her baseline PSYCH: The patient's mood and manner are appropriate. Grooming and personal hygiene are appropriate.  MEDICAL DECISION MAKING: Patient here with lightheadedness and vomiting. Differential diagnosis includes dehydration, ACS, pancreatitis, UTI. Will obtain abdominal labs, troponin, urine.  Will give IV fluids and Zofran and reassess. EKG shows no ischemic changes.  ED PROGRESS: 10:52 PM  Pt reports feeling much better after 1 L of IV fluid and states that she is ready for discharge home. She is able to ambulate with a walker without difficulty. She uses a walker at home at baseline. Her labs are unremarkable. Urine shows no sign of infection. Troponin is negative. Given symptoms started early this morning, I do not feel she needs serial sets of enzymes. Her orthostatic vital signs are negative thus that she still may have had mild dehydration given she feels much better after IV fluids and Zofran. Have discussed strict return precautions and supportive care instructions. Patient and family verbalize understanding and are comfortable with plan.     EKG Interpretation  Date/Time:  Monday July 20 2013 20:23:01 EDT Ventricular Rate:  60 PR Interval:  128 QRS Duration: 91 QT Interval:  448 QTC Calculation: 448 R Axis:   61 Text Interpretation:  Sinus rhythm Abnormal R-wave progression, early transition Confirmed by WARD,  DO, KRISTEN ([id number]) on 07/20/2013 8:52:47 PM         [place], DO 07/21/13 [id number]

## 2013-07-20 NOTE — ED Notes (Signed)
Pt is currently in the bathroom, ambulated with walker from home.

## 2013-10-15 ENCOUNTER — Ambulatory Visit (INDEPENDENT_AMBULATORY_CARE_PROVIDER_SITE_OTHER): Payer: Medicare HMO | Admitting: Family Medicine

## 2013-10-15 ENCOUNTER — Encounter: Payer: Self-pay | Admitting: Family Medicine

## 2013-10-15 VITALS — BP 110/80 | Temp 98.0°F | Wt 255.0 lb

## 2013-10-15 DIAGNOSIS — F502 Bulimia nervosa, unspecified: Secondary | ICD-10-CM

## 2013-10-15 DIAGNOSIS — Z23 Encounter for immunization: Secondary | ICD-10-CM

## 2013-10-15 DIAGNOSIS — R112 Nausea with vomiting, unspecified: Secondary | ICD-10-CM | POA: Insufficient documentation

## 2013-10-15 NOTE — Patient Instructions (Signed)
Clear liquid diet........... advanced to a soft diet and a couple days when you feel better but no greasy or fatty foods  No aspirin  No Naprosyn  Labs today  We will get you set her for an ultrasound of your gallbladder ASAP  Whatever day you have the ultrasound return the following day to see me for followup

## 2013-10-15 NOTE — Progress Notes (Signed)
Pre visit review using our clinic review tool, if applicable. No additional management support is needed unless otherwise documented below in the visit note. 

## 2013-10-15 NOTE — Progress Notes (Signed)
   Subjective:    Patient ID: Taylor Mccall, female    DOB: Sep 14, 1945, 68 y.o.   MRN: [id number]  HPI Taylor Mccall is a 68 year old female who comes in today for evaluation of vomiting  She states in June of this year she had 3 episodes of vomiting went to the emergency room and they did an evaluation which included lab work and echocardiogram all of which was normal. She was given IV fluids and discharged. She's not any problem until Tuesday of this week than she get woke up with severe nausea vomiting x3. Since that time she's been on the nauseated but no vomiting. No fever chills trouble with her urine or bowels.  She didn't eat much on Monday because she didn't feel well.  Her mother set a history of gallbladder disease   Review of Systems Review of systems otherwise negative    Objective:   Physical Exam  Well-developed well-nourished female no acute distress vital signs stable she's afebrile examination the abdomen the abdomen is flat bowel sounds are normal liver spleen kidney stone larger can appreciate no masses. There is note there is diffuse tenderness but no rebound      Assessment & Plan:  Episode x3 of vomiting...........Marland Kitchen rule out gallbladder disease

## 2013-10-16 LAB — CBC WITH DIFFERENTIAL/PLATELET
BASOS ABS: 0.1 10*3/uL (ref 0.0–0.1)
Basophils Relative: 1.7 % (ref 0.0–3.0)
EOS ABS: 0.3 10*3/uL (ref 0.0–0.7)
Eosinophils Relative: 5.2 % — ABNORMAL HIGH (ref 0.0–5.0)
HCT: 38.9 % (ref 36.0–46.0)
Hemoglobin: 12.9 g/dL (ref 12.0–15.0)
LYMPHS ABS: 1 10*3/uL (ref 0.7–4.0)
Lymphocytes Relative: 18.2 % (ref 12.0–46.0)
MCHC: 33.2 g/dL (ref 30.0–36.0)
MCV: 89.3 fl (ref 78.0–100.0)
MONO ABS: 0.4 10*3/uL (ref 0.1–1.0)
Monocytes Relative: 7.3 % (ref 3.0–12.0)
Neutro Abs: 3.8 10*3/uL (ref 1.4–7.7)
Neutrophils Relative %: 67.6 % (ref 43.0–77.0)
PLATELETS: 254 10*3/uL (ref 150.0–400.0)
RBC: 4.36 Mil/uL (ref 3.87–5.11)
RDW: 13 % (ref 11.5–15.5)
WBC: 5.6 10*3/uL (ref 4.0–10.5)

## 2013-10-16 LAB — HEPATIC FUNCTION PANEL
ALBUMIN: 3.7 g/dL (ref 3.5–5.2)
ALT: 13 U/L (ref 0–35)
AST: 17 U/L (ref 0–37)
Alkaline Phosphatase: 87 U/L (ref 39–117)
Bilirubin, Direct: 0.1 mg/dL (ref 0.0–0.3)
TOTAL PROTEIN: 6.9 g/dL (ref 6.0–8.3)
Total Bilirubin: 0.9 mg/dL (ref 0.2–1.2)

## 2013-10-16 LAB — LIPASE: Lipase: 13 U/L (ref 11.0–59.0)

## 2013-10-16 LAB — AMYLASE: AMYLASE: 57 U/L (ref 27–131)

## 2013-10-19 ENCOUNTER — Ambulatory Visit
Admission: RE | Admit: 2013-10-19 | Discharge: 2013-10-19 | Disposition: A | Discharge status: Home / Self Care | Payer: Medicare HMO | Source: Ambulatory Visit | Attending: Family Medicine | Admitting: Family Medicine

## 2013-10-19 DIAGNOSIS — F502 Bulimia nervosa: Secondary | ICD-10-CM

## 2013-10-20 ENCOUNTER — Ambulatory Visit (INDEPENDENT_AMBULATORY_CARE_PROVIDER_SITE_OTHER): Payer: Medicare HMO | Admitting: Family Medicine

## 2013-10-20 ENCOUNTER — Encounter: Payer: Self-pay | Admitting: Family Medicine

## 2013-10-20 VITALS — BP 110/80

## 2013-10-20 DIAGNOSIS — R1115 Cyclical vomiting syndrome unrelated to migraine: Secondary | ICD-10-CM

## 2013-10-20 NOTE — Patient Instructions (Signed)
I have sent an e-mail to GI.......... they will call you within the next day or 2 to get you an appointment to see a gastroenterologist Dr. Arelia Longest  In the meantime if your symptoms get worse or you develop fever go to [facility] long the emergency room.

## 2013-10-20 NOTE — Progress Notes (Signed)
Pre visit review using our clinic review tool, if applicable. No additional management support is needed unless otherwise documented below in the visit note. 

## 2013-10-20 NOTE — Progress Notes (Signed)
   Subjective:    Patient ID: Taylor Mccall, female    DOB: 10-13-1945, 68 y.o.   MRN: [id number]  HPI Taylor Mccall is a 68 year old female who comes in today for reevaluation of her GI complaints  We saw her last week and did a workup which included basic labs all of which were normal. She had ultrasound yesterday which was normal. She continues to feel poorly bloating nauseated episodes of vomiting. No fever chills weight loss or diarrhea   Review of Systems    review of systems otherwise negative Objective:   Physical Exam Well-developed well-nourished female no acute distress vital signs stable she is afebrile       Assessment & Plan:  And GI complaints as noted above etiology unknown........ consult with her gastroenterologist Dr. Arelia Longest

## 2013-10-22 ENCOUNTER — Encounter: Payer: Self-pay | Admitting: Internal Medicine

## 2013-10-29 LAB — HM DIABETES EYE EXAM

## 2013-10-30 ENCOUNTER — Encounter: Payer: Self-pay | Admitting: Family Medicine

## 2013-11-25 ENCOUNTER — Other Ambulatory Visit (HOSPITAL_COMMUNITY): Payer: Self-pay | Admitting: Orthopedic Surgery

## 2013-12-02 ENCOUNTER — Other Ambulatory Visit (HOSPITAL_COMMUNITY): Payer: Self-pay | Admitting: *Deleted

## 2013-12-02 NOTE — Pre-Procedure Instructions (Signed)
SCHERYL SANBORN  12/02/2013   Your procedure is scheduled on:  Wednesday, December 09, 2013 at 8:30 AM.   Report to [facility] Entrance "A" Admitting Office at 6:30 AM.   Call this number if you have problems the morning of surgery: [phone]   Remember:   Do not eat food or drink liquids after midnight Tuesday, 12/08/13.   Take these medicines the morning of surgery with A SIP OF WATER:   Stop Aspirin, Vitamins, Fish Oil, and NSAIDS (Naproxen, Aleve, Ibuprofen, etc) as of today.   Do not wear jewelry, make-up or nail polish.  Do not wear lotions, powders, or perfumes. You may wear deodorant.  Do not shave 48 hours prior to surgery.   Do not bring valuables to the hospital.  [facility] is not responsible                  for any belongings or valuables.               Contacts, dentures or bridgework may not be worn into surgery.  Leave suitcase in the car. After surgery it may be brought to your room.  For patients admitted to the hospital, discharge time is determined by your                treatment team.                 Please read over the following fact sheets that you were given: Pain Booklet, Coughing and Deep Breathing, MRSA Information and Surgical Site Infection Prevention

## 2013-12-03 ENCOUNTER — Encounter (HOSPITAL_COMMUNITY)
Admission: RE | Admit: 2013-12-03 | Discharge: 2013-12-03 | Disposition: A | Discharge status: Home / Self Care | Payer: Medicare HMO | Source: Ambulatory Visit | Attending: Orthopedic Surgery | Admitting: Orthopedic Surgery

## 2013-12-03 ENCOUNTER — Ambulatory Visit (HOSPITAL_COMMUNITY)
Admission: RE | Admit: 2013-12-03 | Discharge: 2013-12-03 | Disposition: A | Discharge status: Home / Self Care | Payer: Medicare HMO | Source: Ambulatory Visit | Attending: Orthopedic Surgery | Admitting: Orthopedic Surgery

## 2013-12-03 ENCOUNTER — Encounter (HOSPITAL_COMMUNITY): Payer: Self-pay

## 2013-12-03 DIAGNOSIS — M179 Osteoarthritis of knee, unspecified: Secondary | ICD-10-CM | POA: Diagnosis not present

## 2013-12-03 DIAGNOSIS — Z01811 Encounter for preprocedural respiratory examination: Secondary | ICD-10-CM | POA: Diagnosis not present

## 2013-12-03 DIAGNOSIS — M1712 Unilateral primary osteoarthritis, left knee: Secondary | ICD-10-CM

## 2013-12-03 DIAGNOSIS — Z01812 Encounter for preprocedural laboratory examination: Secondary | ICD-10-CM | POA: Diagnosis not present

## 2013-12-03 HISTORY — DX: Anxiety disorder, unspecified: F41.9

## 2013-12-03 HISTORY — DX: Sleep apnea, unspecified: G47.30

## 2013-12-03 LAB — CBC
HCT: 37.7 % (ref 36.0–46.0)
Hemoglobin: 12.7 g/dL (ref 12.0–15.0)
MCH: 28.9 pg (ref 26.0–34.0)
MCHC: 33.7 g/dL (ref 30.0–36.0)
MCV: 85.7 fL (ref 78.0–100.0)
PLATELETS: 267 10*3/uL (ref 150–400)
RBC: 4.4 MIL/uL (ref 3.87–5.11)
RDW: 12.4 % (ref 11.5–15.5)
WBC: 4.5 10*3/uL (ref 4.0–10.5)

## 2013-12-03 LAB — COMPREHENSIVE METABOLIC PANEL
ALT: 12 U/L (ref 0–35)
AST: 16 U/L (ref 0–37)
Albumin: 3.6 g/dL (ref 3.5–5.2)
Alkaline Phosphatase: 112 U/L (ref 39–117)
Anion gap: 9 (ref 5–15)
BUN: 17 mg/dL (ref 6–23)
CALCIUM: 9.5 mg/dL (ref 8.4–10.5)
CO2: 27 meq/L (ref 19–32)
CREATININE: 0.74 mg/dL (ref 0.50–1.10)
Chloride: 107 mEq/L (ref 96–112)
GFR calc Af Amer: >90 mL/min (ref >90)
GFR, EST NON AFRICAN AMERICAN: 84 mL/min — AB (ref >90)
Glucose, Bld: 82 mg/dL (ref 70–99)
Potassium: 4 mEq/L (ref 3.7–5.3)
SODIUM: 143 meq/L (ref 137–147)
TOTAL PROTEIN: 7 g/dL (ref 6.0–8.3)
Total Bilirubin: 0.7 mg/dL (ref 0.3–1.2)

## 2013-12-03 LAB — APTT: aPTT: 29 seconds (ref 24–37)

## 2013-12-03 LAB — PROTIME-INR
INR: 1.11 (ref 0.00–1.49)
PROTHROMBIN TIME: 14.4 s (ref 11.6–15.2)

## 2013-12-03 LAB — SURGICAL PCR SCREEN
MRSA, PCR: NEGATIVE
Staphylococcus aureus: POSITIVE — AB

## 2013-12-08 MED ORDER — CLINDAMYCIN PHOSPHATE 900 MG/50ML IV SOLN
900.0000 mg | INTRAVENOUS | Status: AC
  Administered 2013-12-09: 900 mg via INTRAVENOUS
  Filled 2013-12-08: qty 50

## 2013-12-09 ENCOUNTER — Encounter (HOSPITAL_COMMUNITY): Payer: Self-pay | Admitting: Anesthesiology

## 2013-12-09 ENCOUNTER — Inpatient Hospital Stay (HOSPITAL_COMMUNITY)
Admission: RE | Admit: 2013-12-09 | Discharge: 2013-12-11 | DRG: 470 | Disposition: A | Discharge status: Skilled Nursing Facility | Payer: Medicare HMO | Source: Ambulatory Visit | Attending: Orthopedic Surgery | Admitting: Orthopedic Surgery

## 2013-12-09 ENCOUNTER — Encounter (HOSPITAL_COMMUNITY)
Admission: RE | Disposition: A | Discharge status: Skilled Nursing Facility | Payer: Self-pay | Source: Ambulatory Visit | Attending: Orthopedic Surgery

## 2013-12-09 ENCOUNTER — Inpatient Hospital Stay (HOSPITAL_COMMUNITY): Payer: Medicare HMO | Admitting: Critical Care Medicine

## 2013-12-09 DIAGNOSIS — Z96651 Presence of right artificial knee joint: Secondary | ICD-10-CM

## 2013-12-09 DIAGNOSIS — M1711 Unilateral primary osteoarthritis, right knee: Secondary | ICD-10-CM | POA: Diagnosis present

## 2013-12-09 DIAGNOSIS — I739 Peripheral vascular disease, unspecified: Secondary | ICD-10-CM | POA: Diagnosis present

## 2013-12-09 DIAGNOSIS — M069 Rheumatoid arthritis, unspecified: Secondary | ICD-10-CM | POA: Diagnosis present

## 2013-12-09 DIAGNOSIS — Z96649 Presence of unspecified artificial hip joint: Secondary | ICD-10-CM | POA: Diagnosis present

## 2013-12-09 DIAGNOSIS — Z8249 Family history of ischemic heart disease and other diseases of the circulatory system: Secondary | ICD-10-CM | POA: Diagnosis not present

## 2013-12-09 DIAGNOSIS — Z87891 Personal history of nicotine dependence: Secondary | ICD-10-CM

## 2013-12-09 DIAGNOSIS — Z96653 Presence of artificial knee joint, bilateral: Secondary | ICD-10-CM

## 2013-12-09 DIAGNOSIS — G4733 Obstructive sleep apnea (adult) (pediatric): Secondary | ICD-10-CM | POA: Diagnosis present

## 2013-12-09 DIAGNOSIS — Z825 Family history of asthma and other chronic lower respiratory diseases: Secondary | ICD-10-CM | POA: Diagnosis not present

## 2013-12-09 DIAGNOSIS — I10 Essential (primary) hypertension: Secondary | ICD-10-CM | POA: Diagnosis present

## 2013-12-09 DIAGNOSIS — M25561 Pain in right knee: Secondary | ICD-10-CM | POA: Diagnosis present

## 2013-12-09 HISTORY — PX: TOTAL KNEE ARTHROPLASTY: SHX125

## 2013-12-09 SURGERY — ARTHROPLASTY, KNEE, TOTAL
Anesthesia: General | Site: Knee | Laterality: Right

## 2013-12-09 MED ORDER — GLYCOPYRROLATE 0.2 MG/ML IJ SOLN
INTRAMUSCULAR | Status: AC
Start: 2013-12-09 — End: 2013-12-09
  Filled 2013-12-09: qty 3

## 2013-12-09 MED ORDER — PHENYLEPHRINE 40 MCG/ML (10ML) SYRINGE FOR IV PUSH (FOR BLOOD PRESSURE SUPPORT)
PREFILLED_SYRINGE | INTRAVENOUS | Status: AC
  Filled 2013-12-09: qty 10

## 2013-12-09 MED ORDER — BUPIVACAINE LIPOSOME 1.3 % IJ SUSP
20.0000 mL | Freq: Once | INTRAMUSCULAR | Status: DC
  Filled 2013-12-09: qty 20

## 2013-12-09 MED ORDER — SODIUM CHLORIDE 0.9 % IJ SOLN
INTRAMUSCULAR | Status: AC
  Filled 2013-12-09: qty 10

## 2013-12-09 MED ORDER — DIPHENHYDRAMINE HCL 12.5 MG/5ML PO ELIX: 12.5000 mg | ORAL_SOLUTION | ORAL | Status: DC | PRN

## 2013-12-09 MED ORDER — ROCURONIUM BROMIDE 50 MG/5ML IV SOLN
INTRAVENOUS | Status: AC
  Filled 2013-12-09: qty 1

## 2013-12-09 MED ORDER — METHOCARBAMOL 500 MG PO TABS
500.0000 mg | ORAL_TABLET | Freq: Four times a day (QID) | ORAL | Status: DC | PRN
  Administered 2013-12-09 – 2013-12-11 (×5): 500 mg via ORAL
  Filled 2013-12-09 (×6): qty 1

## 2013-12-09 MED ORDER — ARTIFICIAL TEARS OP OINT
TOPICAL_OINTMENT | OPHTHALMIC | Status: AC
  Filled 2013-12-09: qty 3.5

## 2013-12-09 MED ORDER — POTASSIUM CHLORIDE CRYS ER 20 MEQ PO TBCR
40.0000 meq | EXTENDED_RELEASE_TABLET | Freq: Every morning | ORAL | Status: DC
  Administered 2013-12-10 – 2013-12-11 (×2): 40 meq via ORAL
  Filled 2013-12-09 (×2): qty 2

## 2013-12-09 MED ORDER — HYDROXYCHLOROQUINE SULFATE 200 MG PO TABS
200.0000 mg | ORAL_TABLET | Freq: Two times a day (BID) | ORAL | Status: DC
  Administered 2013-12-09 – 2013-12-11 (×4): 200 mg via ORAL
  Filled 2013-12-09 (×5): qty 1

## 2013-12-09 MED ORDER — SODIUM CHLORIDE 0.9 % IJ SOLN
INTRAMUSCULAR | Status: DC | PRN
  Administered 2013-12-09: 40 mL via INTRAVENOUS

## 2013-12-09 MED ORDER — DEXAMETHASONE SODIUM PHOSPHATE 4 MG/ML IJ SOLN
INTRAMUSCULAR | Status: AC
  Filled 2013-12-09: qty 1

## 2013-12-09 MED ORDER — METHOCARBAMOL 1000 MG/10ML IJ SOLN
500.0000 mg | Freq: Four times a day (QID) | INTRAVENOUS | Status: DC | PRN
  Filled 2013-12-09: qty 5

## 2013-12-09 MED ORDER — ONDANSETRON HCL 4 MG/2ML IJ SOLN: 4.0000 mg | Freq: Once | INTRAMUSCULAR | Status: DC | PRN

## 2013-12-09 MED ORDER — ONDANSETRON HCL 4 MG/2ML IJ SOLN: 4.0000 mg | Freq: Four times a day (QID) | INTRAMUSCULAR | Status: DC | PRN

## 2013-12-09 MED ORDER — DEXAMETHASONE SODIUM PHOSPHATE 4 MG/ML IJ SOLN
INTRAMUSCULAR | Status: DC | PRN
  Administered 2013-12-09: 4 mg via INTRAVENOUS

## 2013-12-09 MED ORDER — TIMOLOL MALEATE 0.5 % OP SOLN
1.0000 [drp] | Freq: Two times a day (BID) | OPHTHALMIC | Status: DC
  Administered 2013-12-09 – 2013-12-11 (×4): 1 [drp] via OPHTHALMIC
  Filled 2013-12-09: qty 5

## 2013-12-09 MED ORDER — METOCLOPRAMIDE HCL 5 MG/ML IJ SOLN: 5.0000 mg | Freq: Three times a day (TID) | INTRAMUSCULAR | Status: DC | PRN

## 2013-12-09 MED ORDER — BISACODYL 5 MG PO TBEC: 5.0000 mg | DELAYED_RELEASE_TABLET | Freq: Every day | ORAL | Status: DC | PRN

## 2013-12-09 MED ORDER — LIDOCAINE HCL (CARDIAC) 20 MG/ML IV SOLN
INTRAVENOUS | Status: AC
  Filled 2013-12-09: qty 5

## 2013-12-09 MED ORDER — ACETAMINOPHEN 650 MG RE SUPP: 650.0000 mg | Freq: Four times a day (QID) | RECTAL | Status: DC | PRN

## 2013-12-09 MED ORDER — NEOSTIGMINE METHYLSULFATE 10 MG/10ML IV SOLN
INTRAVENOUS | Status: AC
  Filled 2013-12-09: qty 1

## 2013-12-09 MED ORDER — HYDROMORPHONE HCL 1 MG/ML IJ SOLN: 1.0000 mg | INTRAMUSCULAR | Status: DC | PRN

## 2013-12-09 MED ORDER — NEOSTIGMINE METHYLSULFATE 10 MG/10ML IV SOLN
INTRAVENOUS | Status: DC | PRN
Start: 2013-12-09 — End: 2013-12-09
  Administered 2013-12-09: 4 mg via INTRAVENOUS

## 2013-12-09 MED ORDER — KETOROLAC TROMETHAMINE 15 MG/ML IJ SOLN
7.5000 mg | Freq: Four times a day (QID) | INTRAMUSCULAR | Status: AC
  Administered 2013-12-09 – 2013-12-10 (×4): 7.5 mg via INTRAVENOUS
  Filled 2013-12-09: qty 1

## 2013-12-09 MED ORDER — SODIUM CHLORIDE 0.9 % IV SOLN
INTRAVENOUS | Status: DC
  Administered 2013-12-09 – 2013-12-10 (×2): via INTRAVENOUS

## 2013-12-09 MED ORDER — MAGNESIUM CITRATE PO SOLN: 1.0000 | Freq: Once | ORAL | Status: AC | PRN

## 2013-12-09 MED ORDER — MENTHOL 3 MG MT LOZG: 1.0000 | LOZENGE | OROMUCOSAL | Status: DC | PRN

## 2013-12-09 MED ORDER — KETOROLAC TROMETHAMINE 15 MG/ML IJ SOLN
INTRAMUSCULAR | Status: AC
  Administered 2013-12-09: 7.5 mg via INTRAVENOUS
  Filled 2013-12-09: qty 1

## 2013-12-09 MED ORDER — OXYCODONE HCL 5 MG PO TABS
ORAL_TABLET | ORAL | Status: AC
  Administered 2013-12-09: 10 mg via ORAL
  Filled 2013-12-09: qty 2

## 2013-12-09 MED ORDER — TIMOLOL HEMIHYDRATE 0.5 % OP SOLN: 1.0000 [drp] | Freq: Two times a day (BID) | OPHTHALMIC | Status: DC

## 2013-12-09 MED ORDER — METOCLOPRAMIDE HCL 10 MG PO TABS: 5.0000 mg | ORAL_TABLET | Freq: Three times a day (TID) | ORAL | Status: DC | PRN

## 2013-12-09 MED ORDER — LIDOCAINE HCL (CARDIAC) 20 MG/ML IV SOLN
INTRAVENOUS | Status: DC | PRN
  Administered 2013-12-09: 100 mg via INTRAVENOUS
  Administered 2013-12-09: 100 mg via INTRATRACHEAL

## 2013-12-09 MED ORDER — FENTANYL CITRATE 0.05 MG/ML IJ SOLN
INTRAMUSCULAR | Status: AC
  Filled 2013-12-09: qty 5

## 2013-12-09 MED ORDER — PHENOL 1.4 % MT LIQD: 1.0000 | OROMUCOSAL | Status: DC | PRN

## 2013-12-09 MED ORDER — GLYCOPYRROLATE 0.2 MG/ML IJ SOLN
INTRAMUSCULAR | Status: DC | PRN
  Administered 2013-12-09: 0.6 mg via INTRAVENOUS

## 2013-12-09 MED ORDER — SODIUM CHLORIDE 0.9 % IR SOLN
Status: DC | PRN
  Administered 2013-12-09: 1

## 2013-12-09 MED ORDER — 0.9 % SODIUM CHLORIDE (POUR BTL) OPTIME
TOPICAL | Status: DC | PRN
  Administered 2013-12-09: 1000 mL

## 2013-12-09 MED ORDER — SUCCINYLCHOLINE CHLORIDE 20 MG/ML IJ SOLN
INTRAMUSCULAR | Status: AC
  Filled 2013-12-09: qty 1

## 2013-12-09 MED ORDER — ONDANSETRON HCL 4 MG/2ML IJ SOLN
INTRAMUSCULAR | Status: AC
  Filled 2013-12-09: qty 2

## 2013-12-09 MED ORDER — HYDROMORPHONE HCL 1 MG/ML IJ SOLN
INTRAMUSCULAR | Status: AC
  Filled 2013-12-09: qty 1

## 2013-12-09 MED ORDER — FENTANYL CITRATE 0.05 MG/ML IJ SOLN
INTRAMUSCULAR | Status: DC | PRN
  Administered 2013-12-09 (×9): 50 ug via INTRAVENOUS

## 2013-12-09 MED ORDER — SERTRALINE HCL 100 MG PO TABS
100.0000 mg | ORAL_TABLET | Freq: Every morning | ORAL | Status: DC
  Administered 2013-12-10 – 2013-12-11 (×2): 100 mg via ORAL
  Filled 2013-12-09 (×2): qty 1

## 2013-12-09 MED ORDER — PROPOFOL 10 MG/ML IV BOLUS
INTRAVENOUS | Status: DC | PRN
Start: 2013-12-09 — End: 2013-12-09
  Administered 2013-12-09: 200 mg via INTRAVENOUS

## 2013-12-09 MED ORDER — BUPIVACAINE LIPOSOME 1.3 % IJ SUSP
INTRAMUSCULAR | Status: DC | PRN
  Administered 2013-12-09: 20 mL

## 2013-12-09 MED ORDER — ASPIRIN EC 325 MG PO TBEC
325.0000 mg | DELAYED_RELEASE_TABLET | Freq: Every day | ORAL | Status: DC
  Administered 2013-12-10 – 2013-12-11 (×2): 325 mg via ORAL
  Filled 2013-12-09 (×3): qty 1

## 2013-12-09 MED ORDER — HYDROMORPHONE HCL 1 MG/ML IJ SOLN
0.2500 mg | INTRAMUSCULAR | Status: DC | PRN
  Administered 2013-12-09 (×4): 0.5 mg via INTRAVENOUS

## 2013-12-09 MED ORDER — PROPOFOL 10 MG/ML IV BOLUS
INTRAVENOUS | Status: AC
  Filled 2013-12-09: qty 20

## 2013-12-09 MED ORDER — HYDROMORPHONE HCL 1 MG/ML IJ SOLN
INTRAMUSCULAR | Status: AC
  Administered 2013-12-09: 0.5 mg via INTRAVENOUS
  Filled 2013-12-09: qty 1

## 2013-12-09 MED ORDER — ONDANSETRON HCL 4 MG PO TABS
4.0000 mg | ORAL_TABLET | Freq: Four times a day (QID) | ORAL | Status: DC | PRN
  Administered 2013-12-11: 4 mg via ORAL
  Filled 2013-12-09 (×2): qty 1

## 2013-12-09 MED ORDER — SUCCINYLCHOLINE CHLORIDE 20 MG/ML IJ SOLN
INTRAMUSCULAR | Status: DC | PRN
  Administered 2013-12-09: 120 mg via INTRAVENOUS

## 2013-12-09 MED ORDER — CLINDAMYCIN PHOSPHATE 600 MG/50ML IV SOLN
600.0000 mg | Freq: Four times a day (QID) | INTRAVENOUS | Status: AC
  Administered 2013-12-09 (×2): 600 mg via INTRAVENOUS
  Filled 2013-12-09 (×3): qty 50

## 2013-12-09 MED ORDER — LACTATED RINGERS IV SOLN
INTRAVENOUS | Status: DC | PRN
  Administered 2013-12-09 (×2): via INTRAVENOUS

## 2013-12-09 MED ORDER — MAGNESIUM HYDROXIDE 400 MG/5ML PO SUSP: 30.0000 mL | Freq: Every day | ORAL | Status: DC | PRN

## 2013-12-09 MED ORDER — PHENYLEPHRINE HCL 10 MG/ML IJ SOLN
INTRAMUSCULAR | Status: DC | PRN
  Administered 2013-12-09: 80 ug via INTRAVENOUS

## 2013-12-09 MED ORDER — METHOCARBAMOL 500 MG PO TABS
ORAL_TABLET | ORAL | Status: AC
Start: 2013-12-09 — End: 2013-12-09
  Administered 2013-12-09: 500 mg via ORAL
  Filled 2013-12-09: qty 1

## 2013-12-09 MED ORDER — EPHEDRINE SULFATE 50 MG/ML IJ SOLN
INTRAMUSCULAR | Status: AC
  Filled 2013-12-09: qty 1

## 2013-12-09 MED ORDER — ROCURONIUM BROMIDE 100 MG/10ML IV SOLN
INTRAVENOUS | Status: DC | PRN
  Administered 2013-12-09: 40 mg via INTRAVENOUS

## 2013-12-09 MED ORDER — ACETAMINOPHEN 325 MG PO TABS: 650.0000 mg | ORAL_TABLET | Freq: Four times a day (QID) | ORAL | Status: DC | PRN

## 2013-12-09 MED ORDER — FUROSEMIDE 40 MG PO TABS
40.0000 mg | ORAL_TABLET | Freq: Every day | ORAL | Status: DC
  Administered 2013-12-09: 40 mg via ORAL
  Filled 2013-12-09 (×2): qty 1

## 2013-12-09 MED ORDER — DOCUSATE SODIUM 100 MG PO CAPS
100.0000 mg | ORAL_CAPSULE | Freq: Two times a day (BID) | ORAL | Status: DC
  Administered 2013-12-09 – 2013-12-11 (×4): 100 mg via ORAL
  Filled 2013-12-09 (×4): qty 1

## 2013-12-09 MED ORDER — MIDAZOLAM HCL 2 MG/2ML IJ SOLN
INTRAMUSCULAR | Status: AC
  Filled 2013-12-09: qty 2

## 2013-12-09 MED ORDER — OXYCODONE HCL 5 MG PO TABS
5.0000 mg | ORAL_TABLET | ORAL | Status: DC | PRN
  Administered 2013-12-09 (×2): 10 mg via ORAL
  Administered 2013-12-10: 5 mg via ORAL
  Administered 2013-12-10 (×4): 10 mg via ORAL
  Administered 2013-12-11: 5 mg via ORAL
  Filled 2013-12-09: qty 1
  Filled 2013-12-09 (×3): qty 2
  Filled 2013-12-09: qty 1
  Filled 2013-12-09 (×3): qty 2

## 2013-12-09 MED ORDER — ONDANSETRON HCL 4 MG/2ML IJ SOLN
INTRAMUSCULAR | Status: DC | PRN
  Administered 2013-12-09: 4 mg via INTRAVENOUS

## 2013-12-09 SURGICAL SUPPLY — 51 items
BLADE SAGITTAL 25.0X1.27X90 (BLADE) ×2 IMPLANT
BLADE SAW SGTL 13.0X1.19X90.0M (BLADE) ×2 IMPLANT
BLADE SURG 21 STRL SS (BLADE) ×4 IMPLANT
BNDG COHESIVE 6X5 TAN STRL LF (GAUZE/BANDAGES/DRESSINGS) ×2 IMPLANT
BNDG GAUZE ELAST 4 BULKY (GAUZE/BANDAGES/DRESSINGS) ×2 IMPLANT
BONE CEMENT PALACOSE (Orthopedic Implant) ×4 IMPLANT
BOWL SMART MIX CTS (DISPOSABLE) ×2 IMPLANT
CAP POR TM CP VIT E LN CER HD ×2 IMPLANT
CEMENT BONE PALACOSE (Orthopedic Implant) ×2 IMPLANT
COVER SURGICAL LIGHT HANDLE (MISCELLANEOUS) ×2 IMPLANT
CUFF TOURNIQUET SINGLE 34IN LL (TOURNIQUET CUFF) IMPLANT
CUFF TOURNIQUET SINGLE 44IN (TOURNIQUET CUFF) IMPLANT
DRAPE EXTREMITY T 121X128X90 (DRAPE) ×2 IMPLANT
DRAPE IMP U-DRAPE 54X76 (DRAPES) ×2 IMPLANT
DRAPE PROXIMA HALF (DRAPES) ×2 IMPLANT
DRAPE U-SHAPE 47X51 STRL (DRAPES) ×2 IMPLANT
DRSG ADAPTIC 3X8 NADH LF (GAUZE/BANDAGES/DRESSINGS) ×2 IMPLANT
DRSG PAD ABDOMINAL 8X10 ST (GAUZE/BANDAGES/DRESSINGS) ×2 IMPLANT
DURAPREP 26ML APPLICATOR (WOUND CARE) ×2 IMPLANT
ELECT REM PT RETURN 9FT ADLT (ELECTROSURGICAL) ×2
ELECTRODE REM PT RTRN 9FT ADLT (ELECTROSURGICAL) ×1 IMPLANT
FACESHIELD WRAPAROUND (MASK) ×2 IMPLANT
GAUZE SPONGE 4X4 12PLY STRL (GAUZE/BANDAGES/DRESSINGS) ×2 IMPLANT
GLOVE BIOGEL PI IND STRL 9 (GLOVE) ×1 IMPLANT
GLOVE BIOGEL PI INDICATOR 9 (GLOVE) ×1
GLOVE SURG ORTHO 9.0 STRL STRW (GLOVE) ×2 IMPLANT
GOWN STRL REUS W/ TWL XL LVL3 (GOWN DISPOSABLE) ×3 IMPLANT
GOWN STRL REUS W/TWL XL LVL3 (GOWN DISPOSABLE) ×6
HANDPIECE INTERPULSE COAX TIP (DISPOSABLE) ×2
KIT BASIN OR (CUSTOM PROCEDURE TRAY) ×2 IMPLANT
KIT ROOM TURNOVER OR (KITS) ×2 IMPLANT
MANIFOLD NEPTUNE II (INSTRUMENTS) ×2 IMPLANT
NEEDLE SPNL 18GX3.5 QUINCKE PK (NEEDLE) ×2 IMPLANT
NS IRRIG 1000ML POUR BTL (IV SOLUTION) ×2 IMPLANT
PACK TOTAL JOINT (CUSTOM PROCEDURE TRAY) ×2 IMPLANT
PACK UNIVERSAL I (CUSTOM PROCEDURE TRAY) ×2 IMPLANT
PAD ARMBOARD 7.5X6 YLW CONV (MISCELLANEOUS) ×4 IMPLANT
PADDING CAST COTTON 6X4 STRL (CAST SUPPLIES) ×2 IMPLANT
SET HNDPC FAN SPRY TIP SCT (DISPOSABLE) ×1 IMPLANT
SPONGE GAUZE 4X4 12PLY STER LF (GAUZE/BANDAGES/DRESSINGS) ×2 IMPLANT
STAPLER VISISTAT 35W (STAPLE) ×2 IMPLANT
SUCTION FRAZIER TIP 10 FR DISP (SUCTIONS) IMPLANT
SUT VIC AB 0 CTB1 27 (SUTURE) ×2 IMPLANT
SUT VIC AB 1 CTX 36 (SUTURE) ×2
SUT VIC AB 1 CTX36XBRD ANBCTR (SUTURE) ×1 IMPLANT
SYR 50ML LL SCALE MARK (SYRINGE) ×2 IMPLANT
TOWEL OR 17X24 6PK STRL BLUE (TOWEL DISPOSABLE) ×2 IMPLANT
TOWEL OR 17X26 10 PK STRL BLUE (TOWEL DISPOSABLE) ×2 IMPLANT
TRAY FOLEY CATH 16FRSI W/METER (SET/KITS/TRAYS/PACK) IMPLANT
WATER STERILE IRR 1000ML POUR (IV SOLUTION) ×4 IMPLANT
WRAP KNEE MAXI GEL POST OP (GAUZE/BANDAGES/DRESSINGS) ×2 IMPLANT

## 2013-12-09 NOTE — H&P (Signed)
TOTAL KNEE ADMISSION H&P  Patient is being admitted for right total knee arthroplasty.  Subjective:  Chief Complaint:right knee pain.  HPI: Taylor Mccall, 68 y.o. female, has a history of pain and functional disability in the right knee due to arthritis and has failed non-surgical conservative treatments for greater than 12 weeks to includecorticosteriod injections, viscosupplementation injections, use of assistive devices and activity modification.  Onset of symptoms was gradual, starting 8 years ago with gradually worsening course since that time. The patient noted no past surgery on the right knee(s).  Patient currently rates pain in the right knee(s) at 8 out of 10 with activity. Patient has night pain, worsening of pain with activity and weight bearing, pain that interferes with activities of daily living, pain with passive range of motion, crepitus and joint swelling.  Patient has evidence of subchondral cysts, subchondral sclerosis, periarticular osteophytes and joint space narrowing by imaging studies. This patient has had avascular necrosis of the knee. There is no active infection.  Patient Active Problem List   Diagnosis Date Noted  . Nausea with vomiting 10/15/2013  . Cough 08/07/2012  . Breast mass, left 01/18/2011  . OBESITY, UNSPECIFIED 12/08/2009  . VENOUS INSUFFICIENCY, CHRONIC 07/28/2009  . GALACTORRHEA 07/28/2009  . PAIN IN JOINT, ANKLE AND FOOT 01/12/2008  . OBSTRUCTIVE SLEEP APNEA 08/29/2007  . GAIT DISTURBANCE 08/14/2007  . DEPRESSION 08/26/2006  . HYPERTENSION 08/26/2006  . OSTEOARTHRITIS 08/26/2006  . URINARY INCONTINENCE 08/26/2006   Past Medical History  Diagnosis Date  . Obesity   . Osteoarthritis   . Urinary incontinence   . Hypertension   . Depression   . Rheumatoid arthritis(714.0)   . Gait disturbance   . Sleep apnea     cpap  . Anxiety     Past Surgical History  Procedure Laterality Date  . Bladder suspension  2009  . Total hip  arthroplasty    . Vesicovaginal fistula closure w/ tah    . Foot surgery      Left great toe  . Joint replacement      lt knee  . Eye surgery    . Breast surgery      reduction  . Abdominal hysterectomy      Prescriptions prior to admission  Medication Sig Dispense Refill Last Dose  . aspirin 81 MG tablet Take 81 mg by mouth daily.     Taking  . carboxymethylcellulose (REFRESH TEARS) 0.5 % SOLN Place 2 drops into the right eye as needed (for itchy eyes).     . Cholecalciferol (VITAMIN D-3 PO) Take 1,000 mg by mouth daily.   Taking  . erythromycin (ERY-TAB) 500 MG EC tablet Take 500-1,000 mg by mouth 2 (two) times daily as needed (for dental procedures). Take 1000 mg by mouth 1 hour prior to dental appointment and take 500 mg by mouth 6 hours after dental work.     . furosemide (LASIX) 20 MG tablet Take 40 mg by mouth daily. At 1300   Taking  . hydroxychloroquine (PLAQUENIL) 200 MG tablet Take 200 mg by mouth 2 (two) times daily.    Taking  . Multiple Vitamins-Minerals (ALIVE WOMENS 50+ PO) Take 2 tablets by mouth daily.     . Multiple Vitamins-Minerals (PRESERVISION AREDS 2) CAPS Take 2 capsules by mouth daily.     . naproxen sodium (ANAPROX) 220 MG tablet Take 440 mg by mouth daily as needed (for pain).     . NON FORMULARY 1 each by Other route See admin  instructions. Use CPAP nightly.     . Omega-3 Fatty Acids (FISH OIL) 1200 MG CAPS Take 2,400 mg by mouth daily.    Taking  . potassium chloride SA (K-DUR,KLOR-CON) 20 MEQ tablet Take 40 mEq by mouth every morning.     . sertraline (ZOLOFT) 100 MG tablet Take 100 mg by mouth every morning.     . timolol (BETIMOL) 0.5 % ophthalmic solution Place 1 drop into the left eye 2 (two) times daily.     . vitamin B-12 (CYANOCOBALAMIN) 1000 MCG tablet Take 1,000 mcg by mouth daily.     . vitamin E 400 UNIT capsule Take 400 Units by mouth daily.   Taking   Allergies  Allergen Reactions  . Penicillins Rash    History  Substance Use Topics  .  Smoking status: Former Smoker -- 0.30 packs/day for 10 years    Types: Cigarettes    Quit date: 02/05/1978  . Smokeless tobacco: Not on file     Comment: over 25 years ago...pt doesnt remember when she quit.   . Alcohol Use: Yes     Comment: occassional/social    Family History  Problem Relation Age of Onset  . Diabetes    . Stroke    . Asthma Brother   . Coronary artery disease Father   . Coronary artery disease Brother   . Skin cancer Father      Review of Systems  All other systems reviewed and are negative.   Objective:  Physical Exam  Vital signs in last 24 hours:    Labs:   Estimated body mass index is 36.59 kg/(m^2) as calculated from the following:   Height as of 07/13/13: 5\' 10"  (1.778 m).   Weight as of 10/15/13: 115.667 kg (255 lb).   Imaging Review Plain radiographs demonstrate moderate degenerative joint disease of the right knee(s). The overall alignment ismild varus. The bone quality appears to be adequate for age and reported activity level.  Assessment/Plan:  End stage arthritis, right knee   The patient history, physical examination, clinical judgment of the provider and imaging studies are consistent with end stage degenerative joint disease of the right knee(s) and total knee arthroplasty is deemed medically necessary. The treatment options including medical management, injection therapy arthroscopy and arthroplasty were discussed at length. The risks and benefits of total knee arthroplasty were presented and reviewed. The risks due to aseptic loosening, infection, stiffness, patella tracking problems, thromboembolic complications and other imponderables were discussed. The patient acknowledged the explanation, agreed to proceed with the plan and consent was signed. Patient is being admitted for inpatient treatment for surgery, pain control, PT, OT, prophylactic antibiotics, VTE prophylaxis, progressive ambulation and ADL's and discharge planning. The  patient is planning to be discharged home with home health services

## 2013-12-09 NOTE — Transfer of Care (Signed)
Immediate Anesthesia Transfer of Care Note  Patient: Taylor Mccall  Procedure(s) Performed: Procedure(s): Right Total Knee Arthroplasty (Right)  Patient Location: PACU  Anesthesia Type:GA combined with regional for post-op pain  Level of Consciousness: awake, alert  and oriented  Airway & Oxygen Therapy: Patient Spontanous Breathing and Patient connected to nasal cannula oxygen  Post-op Assessment: Report given to PACU RN, Post -op Vital signs reviewed and stable and Patient moving all extremities X 4  Post vital signs: Reviewed and stable  Complications: No apparent anesthesia complications

## 2013-12-09 NOTE — Plan of Care (Signed)
Problem: Phase I Progression Outcomes Goal: Dangle or out of bed evening of surgery Outcome: Completed/Met Date Met:  12/09/13

## 2013-12-09 NOTE — Anesthesia Postprocedure Evaluation (Signed)
  Anesthesia Post-op Note  Patient: Taylor Mccall  Procedure(s) Performed: Procedure(s): Right Total Knee Arthroplasty (Right)  Patient Location: PACU  Anesthesia Type:GA combined with regional for post-op pain  Level of Consciousness: awake, oriented, sedated and patient cooperative  Airway and Oxygen Therapy: Patient Spontanous Breathing  Post-op Pain: mild  Post-op Assessment: Post-op Vital signs reviewed, Patient's Cardiovascular Status Stable, Respiratory Function Stable, Patent Airway, No signs of Nausea or vomiting and Pain level controlled  Post-op Vital Signs: stable  Last Vitals:  Filed Vitals:   12/09/13 1015  BP: 124/70  Pulse: 73  Temp:   Resp: 12    Complications: No apparent anesthesia complications

## 2013-12-09 NOTE — Op Note (Signed)
12/09/2013  10:06 AM  PATIENT:  Taylor Mccall    PRE-OPERATIVE DIAGNOSIS:  Osteoarthritis Right Knee  POST-OPERATIVE DIAGNOSIS:  Same  PROCEDURE:  Right Total Knee Arthroplasty  SURGEON:  Newt Minion, MD  PHYSICIAN ASSISTANT:None ANESTHESIA:   General  PREOPERATIVE INDICATIONS:  [name] is a  68 y.o. female with a diagnosis of Osteoarthritis Right Knee who failed conservative measures and elected for surgical management.    The risks benefits and alternatives were discussed with the patient preoperatively including but not limited to the risks of infection, bleeding, nerve injury, cardiopulmonary complications, the need for revision surgery, among others, and the patient was willing to proceed.  OPERATIVE IMPLANTS: Zimmer total knee arthroplasty implants Size E tibia. 11 mm polyethylene tray. Size 32 mm patella. Size 9 femur.  OPERATIVE FINDINGS: multiple loose bodies.  OPERATIVE PROCEDURE: patient was brought to the operating room and underwent a general anesthetic after a femoral block. After adequate levels of anesthesia were obtained patient's right lower extremity was prepped using DuraPrep draped into a sterile field an [place] was used to cover all exposed skin. A midline incision was made carried down to a medial parapatellar retinacular incision. Intramedullary guide was used for 6 of valgus 10 mm was taken off the femur. Femoral cuts were made for size 9 with 3 of external rotation. Tibial cuts were made with 5 posterior slope neutral varus and valgus. Keel punches and box cuts were made for the tibia and femur. Trial components were placed and the knee had good stability with an 11 mm polyethylene tray. 10 mm was taken off the patella and the resurfaced 32 mm patella button was trialed. The knee was stable varus and valgus with trial components. The components removed the meniscus was removed loose bodies were removed. The popliteal fossa was injected with X  peripherall 60 mL diluted from 20 mL. Care was taken not to have an intravascular injection. The tibial and femoral components were cemented in place after pulsatile lavage. Loose cement was removed the polyethylene tray was placed the patella was placed and clamped and the knee was left in extension until the cement hardened. The knee was again irrigated with pulsatile lavage. The patella tracked midline with range of motion. Retinaculum was closed using #1 Vicryl. Subcutaneous is closed using 0 Vicryl. Skin was closed using staples. A sterile compressive dressing was applied. Patient was extubated taken to the PACU in stable condition.

## 2013-12-09 NOTE — Evaluation (Signed)
Physical Therapy Evaluation Patient Details Name: Taylor Mccall MRN: [id number] DOB: 02-Sep-1945 Today's Date: 12/09/2013   History of Present Illness  Patient is a 68 y/o female s/p R TKA. WBAT RLE. PMH of HTN, depression, anxiety, UI.  Clinical Impression  Patient presents with post surgical deficits of RLE s/p R TKA. Pt tolerated standing and taking a few steps along side bed today however mobility assessment limited due to nausea and pain. Right knee buckling present with weightbearing through RLE. Recommend 2 person for gait training for safety. Reviewed HEP. Pt would benefit from acute PT and ST SNF to improve transfers, gait, balance and safe mobility so pt can maximize independence, minimize fall risk and ease burden of care prior to return home.    Follow Up Recommendations SNF;Supervision/Assistance - 24 hour    Equipment Recommendations  None recommended by PT    Recommendations for Other Services       Precautions / Restrictions Precautions Precautions: Knee;Fall Precaution Comments: Reviewed HEP. Restrictions Weight Bearing Restrictions: No      Mobility  Bed Mobility Overal bed mobility: Needs Assistance Bed Mobility: Supine to Sit;Sit to Supine     Supine to sit: HOB elevated;Min assist Sit to supine: Mod assist;HOB elevated   General bed mobility comments: Use of rails. Min A to bring RLE to EOB. Mod A to bring BLEs into bed.   Transfers Overall transfer level: Needs assistance Equipment used: Rolling walker (2 wheeled) Transfers: Sit to/from Stand Sit to Stand: Mod assist;From elevated surface         General transfer comment: Stood from EOB x1 with Mod A for balance as pt with difficulty transitioning UEs from bed to RW.   Ambulation/Gait Ambulation/Gait assistance: Min assist Ambulation Distance (Feet): 2 Feet Assistive device: Rolling walker (2 wheeled) Gait Pattern/deviations: Step-to pattern;Decreased stride length;Decreased stance time -  right;Trunk flexed     General Gait Details: Pt side stepped along side bed to left with Min A for balance and RW management. Increased knee flexion in RLE throughout pregait with knee buckling when weightbearing. Sliding LLE to advance with assist for weightshifting.  Stairs            Wheelchair Mobility    Modified Rankin (Stroke Patients Only)       Balance Overall balance assessment: Needs assistance Sitting-balance support: Bilateral upper extremity supported;Feet supported Sitting balance-Leahy Scale: Fair   Postural control: Posterior lean Standing balance support: During functional activity Standing balance-Leahy Scale: Poor Standing balance comment: Requires BUE support on RW for static and dynamic standing for safety/balance.                             Pertinent Vitals/Pain Pain Assessment: 0-10 Pain Score:  (not rated on pain scale.) Pain Location: right knee Pain Descriptors / Indicators: Sore;Aching Pain Intervention(s): Limited activity within patient's tolerance;Monitored during session;Repositioned;Ice applied    Home Living Family/patient expects to be discharged to:: Skilled nursing facility Living Arrangements: Children   Type of Home: House Home Access: Stairs to enter Entrance Stairs-Rails: None Entrance Stairs-Number of Steps: 2 Home Layout: Two level;Bed/bath upstairs Home Equipment: Walker - 2 wheels;Toilet riser Additional Comments: Pt lives with daughter but will be home alone during the day as daughter works.    Prior Function Level of Independence: Independent with assistive device(s)         Comments: Pt using RW PTA. (I) for ADLs. Cooking, cleaning.  Hand Dominance        Extremity/Trunk Assessment   Upper Extremity Assessment: Defer to OT evaluation           Lower Extremity Assessment: Generalized weakness;RLE deficits/detail RLE Deficits / Details: Pt with limited AROM hip/knee flexion/extension  secondary to affects from femoral nerve block. Ankle AROm WFL. R knee buckling with weightbearing.       Communication   Communication: No difficulties  Cognition Arousal/Alertness: Awake/alert Behavior During Therapy: WFL for tasks assessed/performed Overall Cognitive Status: Within Functional Limits for tasks assessed                      General Comments General comments (skin integrity, edema, etc.): Some bloody drainage present on R knee dressing.     Exercises Total Joint Exercises Ankle Circles/Pumps: Both;10 reps;Supine Quad Sets: Supine;Both;10 reps Gluteal Sets: Both;10 reps;Supine      Assessment/Plan    PT Assessment Patient needs continued PT services  PT Diagnosis Difficulty walking;Generalized weakness;Acute pain   PT Problem List Decreased strength;Pain;Decreased range of motion;Impaired sensation;Decreased activity tolerance;Decreased safety awareness;Decreased balance;Decreased mobility;Decreased skin integrity;Decreased knowledge of precautions  PT Treatment Interventions Balance training;Gait training;Patient/family education;Functional mobility training;Therapeutic activities;Therapeutic exercise   PT Goals (Current goals can be found in the Care Plan section) Acute Rehab PT Goals Patient Stated Goal: to go to rehab to get better PT Goal Formulation: With patient Time For Goal Achievement: 12/23/13 Potential to Achieve Goals: Good    Frequency 7X/week   Barriers to discharge Decreased caregiver support Pt will be home alone during the day    Co-evaluation               End of Session Equipment Utilized During Treatment: Gait belt Activity Tolerance: Other (comment) (Limited by nausea.) Patient left: in bed;with call bell/phone within reach;with bed alarm set;with family/visitor present Nurse Communication: Mobility status;Precautions;Weight bearing status         Time: 1601-0932 PT Time Calculation (min): 26 min   Charges:    PT Evaluation $Initial PT Evaluation Tier I: 1 Procedure PT Treatments $Therapeutic Activity: 8-22 mins   PT G CodesCandy Sledge A 12/09/2013, 4:40 PM  Candy Sledge, [place], DPT [phone]

## 2013-12-09 NOTE — Anesthesia Preprocedure Evaluation (Addendum)
Anesthesia Evaluation  Patient identified by MRN, date of birth, ID band Patient awake    Reviewed: Allergy & Precautions, H&P , NPO status , Patient's Chart, lab work & pertinent test results  Airway Mallampati: II  TM Distance: >3 FB Neck ROM: Limited    Dental  (+) Dental Advisory Given, Poor Dentition   Pulmonary sleep apnea , former smoker,          Cardiovascular hypertension, + Peripheral Vascular Disease     Neuro/Psych    GI/Hepatic   Endo/Other    Renal/GU      Musculoskeletal  (+) Arthritis -,   Abdominal   Peds  Hematology   Anesthesia Other Findings   Reproductive/Obstetrics                            Anesthesia Physical Anesthesia Plan  ASA: III  Anesthesia Plan: General   Post-op Pain Management:    Induction: Intravenous  Airway Management Planned: LMA and Oral ETT  Additional Equipment:   Intra-op Plan:   Post-operative Plan: Extubation in OR  Informed Consent: I have reviewed the patients History and Physical, chart, labs and discussed the procedure including the risks, benefits and alternatives for the proposed anesthesia with the patient or authorized representative who has indicated his/her understanding and acceptance.     Plan Discussed with: CRNA, Anesthesiologist and Surgeon  Anesthesia Plan Comments:         Anesthesia Quick Evaluation

## 2013-12-09 NOTE — Progress Notes (Signed)
Adjusted ice pack on arrival to 5N and noted a moderate amt of bloody drainage. Leaking underneath posterior knee. Cleaned around dsg site, changed linens & reinforced w/ ABD's. Floor RN made aware.

## 2013-12-09 NOTE — Progress Notes (Signed)
Pt brought home machine, tubing and mask for CPAP tonight. Cords checked and are adequate for use in the hospital. Machine set up for pt and water added to humidifier. Pt placed herself on and told to notify for RT if any further assistance is needed.

## 2013-12-09 NOTE — Progress Notes (Signed)
Utilization review completed.  

## 2013-12-09 NOTE — Anesthesia Procedure Notes (Addendum)
Anesthesia Regional Block:  Femoral nerve block  Pre-Anesthetic Checklist: ,, timeout performed, Correct Patient, Correct Site, Correct Laterality, Correct Procedure, Correct Position, site marked, Risks and benefits discussed,  Surgical consent,  Pre-op evaluation,  At surgeon's request and post-op pain management  Laterality: Right  Prep: Maximum Sterile Barrier Precautions used, chloraprep and alcohol swabs       Needles:  Injection technique: Single-shot  Needle Type: Stimulator Needle - 80          Additional Needles:  Procedures: nerve stimulator Femoral nerve block  Nerve Stimulator or Paresthesia:  Response: 0.5 mA, 0.1 ms, 5 cm  Additional Responses:   Narrative:  Start time: 12/09/2013 8:00 AM End time: 12/09/2013 8:10 AM Injection made incrementally with aspirations every 5 mL.  Performed by: Personally   Additional Notes: Pt accepts procedure w/ risks. 20cc 0.5% Marcaine w/ epi w/o difficulty or discomfort. GES     Procedure Name: Intubation Date/Time: 12/09/2013 8:24 AM Performed by: Carola Frost Pre-anesthesia Checklist: Patient identified, Timeout performed, Emergency Drugs available, Suction available and Patient being monitored Patient Re-evaluated:Patient Re-evaluated prior to inductionOxygen Delivery Method: Circle system utilized Preoxygenation: Pre-oxygenation with 100% oxygen Intubation Type: IV induction Ventilation: Mask ventilation without difficulty Laryngoscope Size: Mac and 3 Grade View: Grade IV Tube type: Oral Tube size: 7.5 mm Airway Equipment and Method: Bougie stylet Placement Confirmation: CO2 detector,  positive ETCO2,  ETT inserted through vocal cords under direct vision and breath sounds checked- equal and bilateral Secured at: 22 cm Tube secured with: Tape Dental Injury: Teeth and Oropharynx as per pre-operative assessment  Comments: DLx1 with MAC3 - grade 4 view, DLx1 with MAC 3 and bougie - ETT passed easily over  bougie.

## 2013-12-09 NOTE — Progress Notes (Signed)
Orthopedic Tech Progress Note Patient Details:  Taylor Mccall July 29, 1945 [id number] No footsie roll at this time Patient ID: Hessie Knows, female   DOB: 08-06-1945, 68 y.o.   MRN: [id number]   Braulio Bosch 12/09/2013, 4:02 PM

## 2013-12-10 ENCOUNTER — Encounter (HOSPITAL_COMMUNITY): Payer: Self-pay | Admitting: Orthopedic Surgery

## 2013-12-10 MED ORDER — FUROSEMIDE 20 MG PO TABS
20.0000 mg | ORAL_TABLET | Freq: Every day | ORAL | Status: DC
  Filled 2013-12-10: qty 1

## 2013-12-10 MED ORDER — FUROSEMIDE 40 MG PO TABS
40.0000 mg | ORAL_TABLET | Freq: Every day | ORAL | Status: DC
  Administered 2013-12-10: 40 mg via ORAL
  Filled 2013-12-10 (×2): qty 1

## 2013-12-10 NOTE — Evaluation (Signed)
Occupational Therapy Evaluation and Discharge Patient Details Name: Taylor Mccall MRN: [id number] DOB: [birth year] Today's Date: 12/10/2013    History of Present Illness Patient is a 68 y/o female s/p R TKA. WBAT RLE. PMH of HTN, depression, anxiety, UI.   Clinical Impression   This 68 yo female admitted and underwent above presents to acute OT with increased pain, decreased balance, decreased mobility all affecting her ability to care for herself at an independent level as she was pta. She will benefit from continued OT at SNF, we will sign off.    Follow Up Recommendations  SNF    Equipment Recommendations   (TBD at next venue)       Precautions / Restrictions Precautions Precautions: Knee;Fall Precaution Booklet Issued: Yes (comment) Precaution Comments: Reviewed knee precautions and safe positioning Required Braces or Orthoses: Knee Immobilizer - Right (when up walking (she fell with nursing students earlier today with knee buckling)) Restrictions Weight Bearing Restrictions: No RLE Weight Bearing: Weight bearing as tolerated      Mobility Bed Mobility Overal bed mobility: Needs Assistance Bed Mobility: Sit to Supine     Supine to sit: Min assist;HOB elevated Sit to supine: Mod assist (with KI on and HOB down)   General bed mobility comments: A for leg only for sit>supine  Transfers Overall transfer level: Needs assistance Equipment used: Rolling walker (2 wheeled) Transfers: Sit to/from [facility] Sit to Stand: Mod assist;From elevated surface Stand pivot transfers: Mod assist          Balance Overall balance assessment: Needs assistance Sitting-balance support: No upper extremity supported;Feet supported Sitting balance-Leahy Scale: Good   Postural control: Posterior lean Standing balance support: Bilateral upper extremity supported Standing balance-Leahy Scale: Poor                              ADL Overall ADL's :  Needs assistance/impaired Eating/Feeding: Independent;Sitting   Grooming: Set up;Sitting   Upper Body Bathing: Set up;Sitting   Lower Body Bathing: Maximal assistance (with Mod A sit<>stand)   Upper Body Dressing : Set up;Sitting   Lower Body Dressing: Total assistance (with Mod A sit<>stand)   Toilet Transfer: Moderate assistance;Stand-pivot;RW (recliner (built up) to bed)   Toileting- Water quality scientist and Hygiene: Moderate assistance (with Mod A sit<>stand)                         Pertinent Vitals/Pain Pain Assessment: 0-10 Pain Score: 6  Pain Location: right knee Pain Descriptors / Indicators: Aching;Sore Pain Intervention(s): Monitored during session;Repositioned;Patient requesting pain meds-RN notified        Extremity/Trunk Assessment Upper Extremity Assessment Upper Extremity Assessment: Overall WFL for tasks assessed              Cognition Arousal/Alertness: Awake/alert Behavior During Therapy: WFL for tasks assessed/performed Overall Cognitive Status: Within Functional Limits for tasks assessed                                Home Living Family/patient expects to be discharged to:: Skilled nursing facility                                             OT Diagnosis: Generalized weakness;Acute pain   OT Problem List:  Decreased strength;Decreased range of motion;Impaired balance (sitting and/or standing);Pain;Obesity;Decreased knowledge of use of DME or AE      OT Goals(Current goals can be found in the care plan section) Acute Rehab OT Goals Patient Stated Goal: to go to rehab then home  OT Frequency:                End of Session Equipment Utilized During Treatment: Gait belt;Rolling walker;Right knee immobilizer Nurse Communication: Patient requests pain meds  Activity Tolerance: Patient tolerated treatment well Patient left: in bed;with call bell/phone within reach;with family/visitor present    Time: 6237-6283 OT Time Calculation (min): 21 min Charges:  OT General Charges $OT Visit: 1 Procedure OT Evaluation $Initial OT Evaluation Tier I: 1 Procedure OT Treatments $Self Care/Home Management : 8-22 mins  Almon Register [phone] 12/10/2013, 4:57 PM

## 2013-12-10 NOTE — Progress Notes (Signed)
Patient ID: Taylor Mccall, female   DOB: 04/22/1945, 68 y.o.   MRN: [id number] Postoperative day 1 total knee arthroplasty. Discussed the importance of working on knee extension. Plan for discharge to skilled nursing facility. We'll have a new dry dressing applied today.

## 2013-12-10 NOTE — Clinical Social Work Psychosocial (Signed)
Clinical Social Work Department BRIEF PSYCHOSOCIAL ASSESSMENT 12/10/2013  Patient:  Taylor Mccall, Taylor Mccall     Account Number:  [id number]     Admit date:  12/09/2013  Clinical Social Worker:  Delrae Sawyers  Date/Time:  12/10/2013 11:51 AM  Referred by:  Physician  Date Referred:  12/10/2013 Referred for  SNF Placement   Other Referral:   none.   Interview type:  Patient Other interview type:   none.    PSYCHOSOCIAL DATA Living Status:  FAMILY Admitted from facility:   Level of care:   Primary support name:  Laveta Gilkey Primary support relationship to patient:  CHILD, ADULT Degree of support available:   Strong support system.    CURRENT CONCERNS Current Concerns  Post-Acute Placement   Other Concerns:   none.    SOCIAL WORK ASSESSMENT / PLAN CSW consulted regarding possible SNF placement at time of discharge. CSW met with pt at bedside to discuss discharge planning needs. Pt informed CSW pt lives with her daughter. Pt stated pt would prefer placement at either [facility] SNF or [facility]. CSW to continue to follow and assist with discharge planning needs.   Assessment/plan status:  Psychosocial Support/Ongoing Assessment of Needs Other assessment/ plan:   none.   Information/referral to community resources:   [facility] bed offers.    PATIENT'S/FAMILY'S RESPONSE TO PLAN OF CARE: Pt understanding and agreeable to CSW plan of care. Pt expressed no further questions or concerns at this time.       Lubertha Sayres, [place] ([phone]) Licensed Clinical Social Worker Orthopedics [phone]) and Surgical [phone])

## 2013-12-10 NOTE — Progress Notes (Signed)
Pt placed self on home CPap for the night. tol well. Will moniter

## 2013-12-10 NOTE — Clinical Social Work Placement (Addendum)
Clinical Social Work Department CLINICAL SOCIAL WORK PLACEMENT NOTE 12/10/2013  Patient:  Taylor Mccall, Taylor Mccall  Account Number:  [id number] Admit date:  12/09/2013  Clinical Social Worker:  Delrae Sawyers  Date/time:  12/10/2013 11:58 AM  Clinical Social Work is seeking post-discharge placement for this patient at the following level of care:   [place]   (*CSW will update this form in Epic as items are completed)   12/10/2013  Patient/family provided with [place] Department of Clinical Social Work's list of facilities offering this level of care within the geographic area requested by the patient (or if unable, by the patient's family).  12/10/2013  Patient/family informed of their freedom to choose among providers that offer the needed level of care, that participate in Medicare, Medicaid or managed care program needed by the patient, have an available bed and are willing to accept the patient.  12/10/2013  Patient/family informed of [facility]' ownership interest in [facility], as well as of the fact that they are under no obligation to receive care at this facility.  PASARR submitted to EDS on 12/10/2013 PASARR number received on 12/10/2013  FL2 transmitted to all facilities in geographic area requested by pt/family on  12/10/2013 FL2 transmitted to all facilities within larger geographic area on   Patient informed that his/her managed care company has contracts with or will negotiate with  certain facilities, including the following:     Patient/family informed of bed offers received:  12/11/2013 Patient chooses bed at [facility] Physician recommends and patient chooses bed at    Patient to be transferred to  [facility] on  12/11/2013 Patient to be transferred to facility by [facility] Patient and family notified of transfer on 12/11/2013 Name of family member notified:  Pt notified at bedside.  The following physician request were entered  in Epic:   Additional Comments:  H[place] [phone]) Licensed Clinical Social Worker Orthopedics [phone]) and Surgical [phone])

## 2013-12-10 NOTE — Progress Notes (Signed)
Orthopedic Tech Progress Note Patient Details:  [name] 11/09/1945 [id number]  Ortho Devices Type of Ortho Device: Knee Immobilizer Ortho Device/Splint Location: RLE Ortho Device/Splint Interventions: Ordered, Application   Braulio Bosch 12/10/2013, 3:41 PM

## 2013-12-10 NOTE — Progress Notes (Signed)
Physical Therapy Treatment Patient Details Name: Taylor Mccall MRN: [id number] DOB: 03/03/1945 Today's Date: 12/10/2013    History of Present Illness Patient is a 68 y/o female s/p R TKA. WBAT RLE. PMH of HTN, depression, anxiety, UI.    PT Comments    Patient progressing slowly towards physical therapy goals. Limited again due to nausea but able to tolerate bed mobility and transfer training, requiring min-mod assist +2 with these tasks. Tolerates therapeutic exercises well. Patient will continue to benefit from skilled physical therapy services to further improve independence with functional mobility.   Follow Up Recommendations  SNF;Supervision/Assistance - 24 hour     Equipment Recommendations  None recommended by PT    Recommendations for Other Services       Precautions / Restrictions Precautions Precautions: Knee;Fall Precaution Booklet Issued: Yes (comment) Precaution Comments: Reviewed knee precautions and safe positioning Required Braces or Orthoses:  (Have requested KI be ordered for Rt) Restrictions Weight Bearing Restrictions: Yes RLE Weight Bearing: Weight bearing as tolerated    Mobility  Bed Mobility Overal bed mobility: Needs Assistance Bed Mobility: Supine to Sit     Supine to sit: Min assist;HOB elevated     General bed mobility comments: Min assist for truncal support. Educated on use of LLE to support RLE out of bed.   Transfers Overall transfer level: Needs assistance Equipment used: Rolling walker (2 wheeled) Transfers: Sit to/from [facility] Sit to Stand: Mod assist;From elevated surface Stand pivot transfers: Min assist;+2 physical assistance       General transfer comment: Mod assist +2 for boost to stand from bed and reclining chair. VC for hand/foot placement and technique. Rt knee block provided due to buckling. educated on Rt knee extension for quad activation. Min assist for pivot. To chair again providing Rt  knee block and cues for technique.  Ambulation/Gait Ambulation/Gait assistance: Min assist;+2 physical assistance Ambulation Distance (Feet): 2 Feet Assistive device: Rolling walker (2 wheeled) Gait Pattern/deviations: Step-to pattern;Decreased step length - right;Decreased step length - left;Decreased stance time - right;Antalgic;Narrow base of support   Gait velocity interpretation: Below normal speed for age/gender General Gait Details: Pt able to take two steps forward today with min assist +2 for RW placement and stability. Blocked right knee due to buckling. Distance limited by increase in nausea but no emesis.   Stairs            Wheelchair Mobility    Modified Rankin (Stroke Patients Only)       Balance                                    Cognition Arousal/Alertness: Awake/alert Behavior During Therapy: WFL for tasks assessed/performed Overall Cognitive Status: Within Functional Limits for tasks assessed                      Exercises Total Joint Exercises Ankle Circles/Pumps: AROM;Both;10 reps;Seated Quad Sets: AROM;10 reps;Seated;Both Gluteal Sets: Strengthening;Both;10 reps;Seated Long Arc Quad: AAROM;Right;10 reps;Seated    General Comments        Pertinent Vitals/Pain Pain Assessment: 0-10 Pain Score:  ("Very sore, and nauseated" No value given) Pain Location: Rt knee Pain Descriptors / Indicators: Sore Pain Intervention(s): Monitored during session;Repositioned;Limited activity within patient's tolerance    Home Living                      Prior Function  PT Goals (current goals can now be found in the care plan section) Progress towards PT goals: Progressing toward goals    Frequency  7X/week    PT Plan Current plan remains appropriate    Co-evaluation             End of Session Equipment Utilized During Treatment: Gait belt Activity Tolerance: Other (comment) (Limited by  nausea) Patient left: in chair;with call bell/phone within reach;with family/visitor present     Time: 9833-8250 PT Time Calculation (min): 29 min  Charges:  $Therapeutic Exercise: 8-22 mins $Therapeutic Activity: 8-22 mins                    G Codes:      Ellouise Newer Jan 01, 2014, 3:36 PM  Camille Bal [place], [place]

## 2013-12-11 MED ORDER — ASPIRIN EC 325 MG PO TBEC: 325.0000 mg | DELAYED_RELEASE_TABLET | Freq: Every day | ORAL | Status: DC

## 2013-12-11 MED ORDER — METHOCARBAMOL 500 MG PO TABS: 500.0000 mg | ORAL_TABLET | Freq: Three times a day (TID) | ORAL | Status: DC

## 2013-12-11 MED ORDER — OXYCODONE-ACETAMINOPHEN 5-325 MG PO TABS: 1.0000 | ORAL_TABLET | ORAL | Status: DC | PRN

## 2013-12-11 NOTE — Care Management Note (Signed)
CARE MANAGEMENT NOTE 12/11/2013  Patient:  Taylor Mccall, Taylor Mccall   Account Number:  [id number]  Date Initiated:  12/11/2013  Documentation initiated by:  Ricki Miller  Subjective/Objective Assessment:   68 yr old female admitted with osteoarthritis of right knee. Patient had Mccall right total knee arthroplasty.     Action/Plan:   Patient is for shortterm rehab at SNF, will go to [facility]. Social worker is aware.   Anticipated DC Date:  12/11/2013   Anticipated DC Plan:  SKILLED NURSING FACILITY  In-house referral  Clinical Social Worker      DC Planning Services  CM consult      Choice offered to / List presented to:     DME arranged  NA        [place] arranged  NA      Status of service:  Completed, signed off Medicare Important Message given?  NA - LOS <3 / Initial given by admissions (If response is "NO", the following Medicare IM given date fields will be blank) Date Medicare IM given:   Medicare IM given by:   Date Additional Medicare IM given:   Additional Medicare IM given by:    Discharge Disposition:  [place]  Per UR Regulation:  Reviewed for med. necessity/level of care/duration of stay

## 2013-12-11 NOTE — Discharge Summary (Signed)
Physician Discharge Summary  Patient ID: Taylor Mccall MRN: [id number] DOB/AGE: 70-Dec-1947 70 y.o.  Admit date: 12/09/2013 Discharge date: 12/11/2013  Admission Diagnoses:osteoarthritis right knee  Discharge Diagnoses:  Active Problems:   Total knee replacement status   Discharged Condition: stable  Hospital Course: patient's hospital course was essentially unremarkable. She underwent total knee arthroplasty. Postoperatively she progressed slowly with therapy and required discharge to skilled nursing.  Consults: None  Significant Diagnostic Studies: labs: Routine labs  Treatments: IV hydration and surgery: see operative note  Discharge Exam: Blood pressure 112/53, pulse 99, temperature 98.4 F (36.9 C), temperature source Oral, resp. rate 18, height 5\' 10"  (1.778 m), weight 117.028 kg (258 lb), SpO2 96 %. Incision/Wound:incision clean dry and intact  Disposition: 01-Home or Self Care     Medication List    ASK your doctor about these medications        ALIVE WOMENS 50+ PO  Take 2 tablets by mouth daily.     PRESERVISION AREDS 2 Caps  Take 2 capsules by mouth daily.     aspirin 81 MG tablet  Take 81 mg by mouth daily.     erythromycin 500 MG EC tablet  Commonly known as:  ERY-TAB  Take 500-1,000 mg by mouth 2 (two) times daily as needed (for dental procedures). Take 1000 mg by mouth 1 hour prior to dental appointment and take 500 mg by mouth 6 hours after dental work.     Fish Oil 1200 MG Caps  Take 2,400 mg by mouth daily.     furosemide 20 MG tablet  Commonly known as:  LASIX  Take 40 mg by mouth daily. At 1300     hydroxychloroquine 200 MG tablet  Commonly known as:  PLAQUENIL  Take 200 mg by mouth 2 (two) times daily.     naproxen sodium 220 MG tablet  Commonly known as:  ANAPROX  Take 440 mg by mouth daily as needed (for pain).     NON FORMULARY  1 each by Other route See admin instructions. Use CPAP nightly.     potassium chloride SA 20 MEQ  tablet  Commonly known as:  K-DUR,KLOR-CON  Take 40 mEq by mouth every morning.     REFRESH TEARS 0.5 % Soln  Generic drug:  carboxymethylcellulose  Place 2 drops into the right eye as needed (for itchy eyes).     sertraline 100 MG tablet  Commonly known as:  ZOLOFT  Take 100 mg by mouth every morning.     timolol 0.5 % ophthalmic solution  Commonly known as:  BETIMOL  Place 1 drop into the left eye 2 (two) times daily.     vitamin B-12 1000 MCG tablet  Commonly known as:  CYANOCOBALAMIN  Take 1,000 mcg by mouth daily.     VITAMIN D-3 PO  Take 1,000 mg by mouth daily.     vitamin E 400 UNIT capsule  Take 400 Units by mouth daily.           Follow-up Information    Follow up with DUDA,MARCUS V, MD In 2 weeks.   Specialty:  Orthopedic Surgery   Contact information:   [place] [address] [phone]       Signed: Newt Minion 12/11/2013, 6:43 AM

## 2013-12-11 NOTE — Progress Notes (Signed)
Patient ID: Taylor Mccall, female   DOB: 07/01/1945, 68 y.o.   MRN: [id number] Patient had weakness with gait training yesterday. Patient will require discharge to skilled nursing. Prescriptions and paperwork completed.

## 2013-12-11 NOTE — Progress Notes (Signed)
Assisted patient from chair to bed at 1240 with Bayard Beaver, SN. During transfer, the patient stated she was unable to bear weight on her legs and began to sit down mid-transfer, before reaching the bed. The patient was unable to fully return to a standing position and was assisted to the floor without difficulty. The patient's family member was present at the [facility]. She denies pain different than she reported prior to transfer. Other assessment findings were unchanged from her previous assessment. Her VS were 88/46, 100% RA.  Dr. Sharol Given notified of situation, pt assessment and VS. Orders received and electronically entered and carried out. Will continue to monitor.

## 2013-12-11 NOTE — Progress Notes (Signed)
Physical Therapy Treatment Patient Details Name: Taylor Mccall MRN: [id number] DOB: Jul 25, 1945 Today's Date: 12/11/2013    History of Present Illness Patient is a 68 y/o female s/p R TKA. WBAT RLE. PMH of HTN, depression, anxiety, UI.    PT Comments    Gradually progressing; agree with need for SNF level rehab before DC home.  Pt. Anticipated transfer to [facility] today.    Follow Up Recommendations  SNF;Supervision/Assistance - 24 hour     Equipment Recommendations  None recommended by PT    Recommendations for Other Services       Precautions / Restrictions Precautions Precautions: Knee;Fall Precaution Comments: reviewed knee precautions and best knee positioning Required Braces or Orthoses: Knee Immobilizer - Right (KI when up walking, pt. had fall 11/5 with nursing student ) Restrictions Weight Bearing Restrictions: Yes RLE Weight Bearing: Weight bearing as tolerated    Mobility  Bed Mobility Overal bed mobility:  (pt. seated at EOB upon PT arival)                Transfers Overall transfer level: Needs assistance Equipment used: Rolling walker (2 wheeled) Transfers: Sit to/from Stand Sit to Stand: +2 physical assistance;Mod assist         General transfer comment: Pt. needed significant assist to rise to stand; cues for hand placement and techniique  Ambulation/Gait Ambulation/Gait assistance: +2 physical assistance;Mod assist Ambulation Distance (Feet): 5 Feet Assistive device: Rolling walker (2 wheeled) Gait Pattern/deviations: Step-to pattern;Decreased stride length;Decreased stance time - right;Antalgic;Trunk flexed Gait velocity: decreased   General Gait Details: Pt. needed mod to min support for  short distance ambulation; limited weightbeaing due to pain in right LE; nausea with walking   Stairs            Wheelchair Mobility    Modified Rankin (Stroke Patients Only)       Balance                                     Cognition Arousal/Alertness: Lethargic;Suspect due to medications (appears sleepy but responds to therapist's requests) Behavior During Therapy: [facility] for tasks assessed/performed Overall Cognitive Status: Within Functional Limits for tasks assessed                      Exercises Total Joint Exercises Ankle Circles/Pumps: AROM;Both;10 reps;Seated Quad Sets: AROM;10 reps;Seated;Both Short Arc Quad: AAROM;Right;10 reps;Seated Knee Flexion: AROM;Right;5 reps;Seated Goniometric ROM: -7 to 89    General Comments        Pertinent Vitals/Pain Pain Assessment: 0-10 Pain Score: 4  Pain Location: right knee Pain Descriptors / Indicators: Aching Pain Intervention(s): Monitored during session;Limited activity within patient's tolerance;Repositioned;Other (comment) (RN notified of pt.'s nausea)    Home Living                      Prior Function            PT Goals (current goals can now be found in the care plan section) Progress towards PT goals: Progressing toward goals    Frequency  7X/week    PT Plan Current plan remains appropriate    Co-evaluation             End of Session Equipment Utilized During Treatment: Gait belt Activity Tolerance: Other (comment) (limited by nausea) Patient left: in chair;with call bell/phone within reach     Time: 1042-1105 PT  Time Calculation (min): 23 min  Charges:  $Gait Training: 8-22 mins $Therapeutic Exercise: 8-22 mins                    G Codes:      Taylor Mccall 12/11/2013, 11:21 AM Taylor Mccall PT Acute Rehab Services [phone] [place] [phone]

## 2013-12-11 NOTE — Clinical Social Work Note (Signed)
Pt to be discharged to [facility]. Pt updated at bedside regarding discharge.  [facility]: [phone] Transportation: EMS ([facility]) scheduled for after lunch (bed not available until noon)  Lubertha Sayres, [place] [phone]) Licensed Clinical Social Worker Orthopedics [phone]) and Surgical [phone])

## 2013-12-11 NOTE — Progress Notes (Signed)
Patient discharged to [facility] by ambulance. Patient was discharged with belongings and knee immobilizer in place. Report was called prior to patients departure. Patient was stable upon discharge.

## 2013-12-14 ENCOUNTER — Non-Acute Institutional Stay (SKILLED_NURSING_FACILITY): Payer: Medicare HMO | Admitting: Internal Medicine

## 2013-12-14 ENCOUNTER — Other Ambulatory Visit: Payer: Self-pay | Admitting: *Deleted

## 2013-12-14 ENCOUNTER — Encounter: Payer: Self-pay | Admitting: Internal Medicine

## 2013-12-14 DIAGNOSIS — I10 Essential (primary) hypertension: Secondary | ICD-10-CM

## 2013-12-14 DIAGNOSIS — M62838 Other muscle spasm: Secondary | ICD-10-CM

## 2013-12-14 DIAGNOSIS — F32A Depression, unspecified: Secondary | ICD-10-CM

## 2013-12-14 DIAGNOSIS — M069 Rheumatoid arthritis, unspecified: Secondary | ICD-10-CM

## 2013-12-14 DIAGNOSIS — M1711 Unilateral primary osteoarthritis, right knee: Secondary | ICD-10-CM

## 2013-12-14 DIAGNOSIS — K5900 Constipation, unspecified: Secondary | ICD-10-CM

## 2013-12-14 DIAGNOSIS — F329 Major depressive disorder, single episode, unspecified: Secondary | ICD-10-CM

## 2013-12-14 DIAGNOSIS — G4733 Obstructive sleep apnea (adult) (pediatric): Secondary | ICD-10-CM

## 2013-12-14 MED ORDER — HYDROCODONE-ACETAMINOPHEN 5-325 MG PO TABS: ORAL_TABLET | ORAL | Status: DC

## 2013-12-14 NOTE — Progress Notes (Signed)
Patient ID: Taylor Mccall, female   DOB: 08-Jul-1945, 68 y.o.   MRN: [id number]     [facility]   PCP: TODD,JEFFREY ALLEN, MD  Code Status: full code  Allergies  Allergen Reactions  . Penicillins Rash    Chief Complaint  Patient presents with  . New Admit To SNF     HPI:  68 y/o female patient is here for STR after hospital admission from 12/09/13-12/11/13 with right knee OA. She underwent right total knee arthroplasty. She is seen in her room today. She complaints of muscle tightness and spasm but pain otherwise is under control with current regimen. No other concerns. Staff has no new concern for her She has PMH of HTN, chronic venous insufficiency, OSA, obesity among others  Review of Systems:  Constitutional: Negative for fever, chills, malaise/fatigue and diaphoresis.  HENT: Negative for congestion Respiratory: Negative for cough, sputum production, shortness of breath and wheezing.   Cardiovascular: Negative for chest pain, palpitations, orthopnea and leg swelling.  Gastrointestinal: Negative for heartburn, nausea, vomiting, abdominal pain. Bowel movement is regular now with her stool softener Genitourinary: Negative for dysuria Musculoskeletal: Negative for back pain, falls Skin: Negative for itching and rash.  Neurological: Negative for dizziness, focal weakness and headaches.  Psychiatric/Behavioral: Negative for depression   Past Medical History  Diagnosis Date  . Obesity   . Osteoarthritis   . Urinary incontinence   . Hypertension   . Depression   . Rheumatoid arthritis(714.0)   . Gait disturbance   . Sleep apnea     cpap  . Anxiety    Past Surgical History  Procedure Laterality Date  . Bladder suspension  2009  . Total hip arthroplasty    . Vesicovaginal fistula closure w/ tah    . Foot surgery      Left great toe  . Joint replacement      lt knee  . Eye surgery    . Breast surgery      reduction  .  Abdominal hysterectomy    . Total knee arthroplasty Right 12/09/2013    Procedure: Right Total Knee Arthroplasty;  Surgeon: Newt Minion, MD;  Location: [place];  Service: Orthopedics;  Laterality: Right;   Social History:   reports that she quit smoking about 35 years ago. Her smoking use included Cigarettes. She has a 3 pack-year smoking history. She does not have any smokeless tobacco history on file. She reports that she drinks alcohol. She reports that she does not use illicit drugs.  Family History  Problem Relation Age of Onset  . Diabetes    . Stroke    . Asthma Brother   . Coronary artery disease Father   . Coronary artery disease Brother   . Skin cancer Father     Medications: Patient's Medications  New Prescriptions   No medications on file  Previous Medications   ASPIRIN EC 325 MG TABLET    Take 1 tablet (325 mg total) by mouth daily.   CARBOXYMETHYLCELLULOSE (REFRESH TEARS) 0.5 % SOLN    Place 2 drops into the right eye as needed (for itchy eyes).   CHOLECALCIFEROL (VITAMIN D-3 PO)    Take 1,000 mg by mouth daily.   ERYTHROMYCIN (ERY-TAB) 500 MG EC TABLET    Take 500-1,000 mg by mouth 2 (two) times daily as needed (for dental procedures). Take 1000 mg by mouth 1 hour prior to dental appointment and take 500 mg by mouth 6 hours after  dental work.   FUROSEMIDE (LASIX) 20 MG TABLET    Take 40 mg by mouth daily. At 1300   HYDROCODONE-ACETAMINOPHEN (NORCO/VICODIN) 5-325 MG PER TABLET    Take one tablet by mouth every 4 hours as needed for pain; Do not exceed 4gm of Tylenol in 24 hours. Take two tablets by mouth every 6 hours as needed for pain   HYDROXYCHLOROQUINE (PLAQUENIL) 200 MG TABLET    Take 200 mg by mouth 2 (two) times daily.    METHOCARBAMOL (ROBAXIN) 500 MG TABLET    Take 1 tablet (500 mg total) by mouth 3 (three) times daily.   MULTIPLE VITAMINS-MINERALS (PRESERVISION AREDS 2) CAPS    Take 2 capsules by mouth daily.   NON FORMULARY    1 each by Other route See admin  instructions. Use CPAP nightly.   OMEGA-3 FATTY ACIDS (FISH OIL) 1200 MG CAPS    Take 2,400 mg by mouth daily.    POTASSIUM CHLORIDE SA (K-DUR,KLOR-CON) 20 MEQ TABLET    Take 40 mEq by mouth every morning.   SERTRALINE (ZOLOFT) 100 MG TABLET    Take 100 mg by mouth every morning.   TIMOLOL (BETIMOL) 0.5 % OPHTHALMIC SOLUTION    Place 1 drop into the left eye 2 (two) times daily.   VITAMIN B-12 (CYANOCOBALAMIN) 1000 MCG TABLET    Take 1,000 mcg by mouth daily.   VITAMIN E 400 UNIT CAPSULE    Take 400 Units by mouth daily.  Modified Medications   No medications on file  Discontinued Medications   ASPIRIN 81 MG TABLET    Take 81 mg by mouth daily.     MULTIPLE VITAMINS-MINERALS (ALIVE WOMENS 50+ PO)    Take 2 tablets by mouth daily.   NAPROXEN SODIUM (ANAPROX) 220 MG TABLET    Take 440 mg by mouth daily as needed (for pain).   OXYCODONE-ACETAMINOPHEN (ROXICET) 5-325 MG PER TABLET    Take 1 tablet by mouth every 4 (four) hours as needed for severe pain.     Physical Exam: Filed Vitals:   12/14/13 1126  BP: 122/60  Pulse: 83  Temp: 99.8 F (37.7 C)  Resp: 16  SpO2: 95%    General- elderly female in no acute distress, obese Head- atraumatic, normocephalic Eyes- PERRLA, EOMI, no pallor, no icterus, no discharge Neck- no cervical lymphadenopathy Throat- moist mucus membrane Cardiovascular- normal s1,s2, no murmurs Respiratory- bilateral clear to auscultation, no wheeze, no rhonchi, no crackles, no use of accessory muscles Abdomen- bowel sounds present, soft, non tender Musculoskeletal- able to move all 4 extremities, right knee ROM limited, trace edema in both legs Neurological- no focal deficit Skin- warm and dry, has 11 staples at right knee incision, margins well approximated, dressing blood stained and soaked, dressing changed, ted hose in place Psychiatry- alert and oriented to person, place and time, normal mood and affect    Labs reviewed: Basic Metabolic Panel:  Recent  Labs  03/10/13 0934 07/20/13 1833 12/03/13 1033  NA 140 137 143  K 3.4* 4.2 4.0  CL 106 100 107  CO2 27 25 27   GLUCOSE 81 94 82  BUN 12 13 17   CREATININE 0.7 0.78 0.74  CALCIUM 9.4 9.8 9.5   Liver Function Tests:  Recent Labs  07/20/13 1833 10/15/13 1617 12/03/13 1033  AST 16 17 16   ALT 15 13 12   ALKPHOS 116 87 112  BILITOT 0.7 0.9 0.7  PROT 7.6 6.9 7.0  ALBUMIN 3.9 3.7 3.6    Recent  Labs  07/20/13 2045 10/15/13 1617  LIPASE 14 13.0  AMYLASE  --  57   No results for input(s): AMMONIA in the last 8760 hours. CBC:  Recent Labs  03/10/13 0934 07/20/13 1833 10/15/13 1617 12/03/13 1033  WBC 4.0* 6.0 5.6 4.5  NEUTROABS 2.3 4.3 3.8  --   HGB 12.5 13.2 12.9 12.7  HCT 38.1 39.0 38.9 37.7  MCV 89.0 85.7 89.3 85.7  PLT 228.0 284 254.0 267   Cardiac Enzymes:  Recent Labs  07/20/13 2045  TROPONINI <0.30   Wt Readings from Last 3 Encounters:  12/09/13 258 lb (117.028 kg)  12/03/13 258 lb 12.8 oz (117.391 kg)  10/15/13 255 lb (115.667 kg)    Assessment/Plan  Right knee OA S/p right knee arthroplasty. Continue daily dressing change. Has f/u with orthopedics. To remove ted hose at night. Otherwise continue ted hose and CPM machine. Continue norco 5-325 1-2 tab q4 hour for pain. WBAT. Will have her work with physical therapy and occupational therapy team to help with gait training and muscle strengthening exercises.fall precautions. Skin care. Encourage to be out of bed. On aspirin 81 mg daily, change to 325 mg daily for dvt prophylaxis.   Muscle spasm Post surgery, Add robaxin 500 mg bid for muscle spasm  Constipation Stable, continue senna s 2 tab daily  OSA Continue CPAP  HTN bp stable. Continue lasix 40 mg daily and kcl supplement, monitor bmp  RA Stable, continue hydroxychloroquine 200 mg bid  Depression Mood currently stable. Continue zoloft 100 mg daily   Family/ staff Communication: reviewed care plan with patient and nursing  supervisor  Goals of care: short term rehabilitation   Labs/tests ordered: cbc, bmp   Blanchie Serve, MD  [facility] [phone] (Monday-Friday 8 am - 5 pm) [phone] (afterhours)

## 2013-12-14 NOTE — Telephone Encounter (Signed)
[facility] 

## 2013-12-17 ENCOUNTER — Non-Acute Institutional Stay (SKILLED_NURSING_FACILITY): Payer: Medicare HMO | Admitting: Adult Health

## 2013-12-17 ENCOUNTER — Encounter: Payer: Self-pay | Admitting: Adult Health

## 2013-12-17 DIAGNOSIS — D62 Acute posthemorrhagic anemia: Secondary | ICD-10-CM

## 2013-12-17 DIAGNOSIS — G4733 Obstructive sleep apnea (adult) (pediatric): Secondary | ICD-10-CM

## 2013-12-17 DIAGNOSIS — M1711 Unilateral primary osteoarthritis, right knee: Secondary | ICD-10-CM

## 2013-12-17 DIAGNOSIS — I10 Essential (primary) hypertension: Secondary | ICD-10-CM

## 2013-12-17 DIAGNOSIS — K5900 Constipation, unspecified: Secondary | ICD-10-CM

## 2013-12-17 DIAGNOSIS — F329 Major depressive disorder, single episode, unspecified: Secondary | ICD-10-CM

## 2013-12-17 DIAGNOSIS — M069 Rheumatoid arthritis, unspecified: Secondary | ICD-10-CM

## 2013-12-17 DIAGNOSIS — M62838 Other muscle spasm: Secondary | ICD-10-CM

## 2013-12-17 DIAGNOSIS — F32A Depression, unspecified: Secondary | ICD-10-CM

## 2013-12-17 NOTE — Progress Notes (Signed)
Patient ID: Taylor Mccall, female   DOB: [birth year], 68 y.o.   MRN: [id number]   12/17/2013  Facility:  Nursing Home Location:  [place] Room Number: 706-P LEVEL OF CARE:  SNF (31)   Chief Complaint  Patient presents with  . Discharge Note    Osteoarthritis S/P right total knee arthroplasty, Anemia, Hypertension, Rheumatoid arthritis, Depression, Muscle spasm, Constipation and OSA    HISTORY OF PRESENT ILLNESS: This is a 68 year old female who is for discharge home with Home health PT and OT. DME: Bedside commode. She has been admitted to [facility] on 12/11/13 from [facility] with Osteoarthritis S/P Right total knee arthroplasty. Patient was admitted to this facility for short-term rehabilitation after the patient's recent hospitalization.  Patient has completed SNF rehabilitation and therapy has cleared the patient for discharge.  REASSESSMENT OF ONGOING PROBLEMS:  HTN: Pt 's HTN remains stable.  Denies CP, sob, DOE, headaches, dizziness or visual disturbances.  No complications from the medications currently being used.  Last BP : 128/66  DEPRESSION: The depression remains stable. Patient denies ongoing feelings of sadness, insomnia, anedhonia or lack of appetite. No complications reported from the medications currently being used. Staff do not report behavioral problems.  CONSTIPATION: The constipation remains stable. No complications from the medications presently being used. Patient denies ongoing constipation, abdominal pain, nausea or vomiting.   PAST MEDICAL HISTORY:  Past Medical History  Diagnosis Date  . Obesity   . Osteoarthritis   . Urinary incontinence   . Hypertension   . Depression   . Rheumatoid arthritis(714.0)   . Gait disturbance   . Sleep apnea     cpap  . Anxiety     CURRENT MEDICATIONS: Reviewed per MAR/see medication list  Allergies  Allergen Reactions  . Penicillins Rash     REVIEW OF  SYSTEMS:  GENERAL: no change in appetite, no fatigue, no weight changes, no fever, chills or weakness RESPIRATORY: no cough, SOB, DOE, wheezing, hemoptysis CARDIAC: no chest pain, or palpitations, +edema GI: no abdominal pain, diarrhea, constipation, heart burn, nausea or vomiting  PHYSICAL EXAMINATION  GENERAL: no acute distress, normal body habitus EYES: conjunctivae normal, sclerae normal, normal eye lids NECK: supple, trachea midline, no neck masses, no thyroid tenderness, no thyromegaly LYMPHATICS: no LAN in the neck, no supraclavicular LAN RESPIRATORY: breathing is even & unlabored, BS CTAB CARDIAC: RRR, no murmur,no extra heart sounds, RLE edema 2+ and LLE edema 1+ GI: abdomen soft, normal BS, no masses, no tenderness, no hepatomegaly, no splenomegaly EXTREMITIES: able to move all 4 extremities PSYCHIATRIC: the patient is alert & oriented to person, affect & behavior appropriate  LABS/RADIOLOGY: 12/15/13  WBC 9.1 hemoglobin 8.4 hematocrit 26.2 Labs reviewed: Basic Metabolic Panel:  Recent Labs  03/10/13 0934 07/20/13 1833 12/03/13 1033  NA 140 137 143  K 3.4* 4.2 4.0  CL 106 100 107  CO2 27 25 27   GLUCOSE 81 94 82  BUN 12 13 17   CREATININE 0.7 0.78 0.74  CALCIUM 9.4 9.8 9.5   Liver Function Tests:  Recent Labs  07/20/13 1833 10/15/13 1617 12/03/13 1033  AST 16 17 16   ALT 15 13 12   ALKPHOS 116 87 112  BILITOT 0.7 0.9 0.7  PROT 7.6 6.9 7.0  ALBUMIN 3.9 3.7 3.6    Recent Labs  07/20/13 2045 10/15/13 1617  LIPASE 14 13.0  AMYLASE  --  57   CBC:  Recent Labs  03/10/13 0934 07/20/13 1833 10/15/13  1617 12/03/13 1033  WBC 4.0* 6.0 5.6 4.5  NEUTROABS 2.3 4.3 3.8  --   HGB 12.5 13.2 12.9 12.7  HCT 38.1 39.0 38.9 37.7  MCV 89.0 85.7 89.3 85.7  PLT 228.0 284 254.0 267   Lipid Panel:  Recent Labs  03/10/13 0934  HDL 57.60   Cardiac Enzymes:  Recent Labs  07/20/13 2045  TROPONINI <0.30    Dg Chest 2 View  12/03/2013   CLINICAL DATA:   Pre-admit for right total knee replacement  EXAM: CHEST  2 VIEW  COMPARISON:  Chest x-ray of 08/07/2012  FINDINGS: No active infiltrate or effusion is seen. Mediastinal and hilar contours are unremarkable. The heart is within normal limits in size. Mild thoracic spine curvature is again noted.  IMPRESSION: No active cardiopulmonary disease.   Electronically Signed   By: Ivar Drape M.D.   On: 12/03/2013 12:28    ASSESSMENT/PLAN:   Osteoarthritis status post right total knee arthroplasty - for Home health PT and OT Anemia, acute blood loss - hemoglobin 8.4 - low; start ferrous sulfate 325 mg by mouth daily Hypertension - well controlled; continue Lasix 40 mg by mouth daily Rheumatoid arthritis - stable; continue hydroxychloroquine 200 mg by mouth twice a day Depression  Stable; continue Zoloft 100 mg by mouth daily Muscle spasm   - stable; recently started on Robaxin 500 mg by mouth twice a day Constipation - continue senna S2 tabs by mouth every at bedtime and start Colace 100 mg by mouth twice a day OSA - continue CPAP   I have filled out patient's discharge paperwork and written prescriptions.  Patient will receive home health PT and OT.  DME provided:bedside commode  Total discharge time: Greater than 30 minutes  Discharge time involved coordination of the discharge process with social worker, nursing staff and therapy department. Medical justification for home health services/DME verified.    CPT CODE: 35456    Mccall,Taylor Giacomo, [facility] [phone]

## 2013-12-18 ENCOUNTER — Encounter: Payer: Self-pay | Admitting: Gastroenterology

## 2013-12-25 ENCOUNTER — Ambulatory Visit: Payer: Medicare HMO | Admitting: Internal Medicine

## 2014-02-25 LAB — HM MAMMOGRAPHY

## 2014-03-01 ENCOUNTER — Encounter: Payer: Self-pay | Admitting: Family Medicine

## 2014-03-02 ENCOUNTER — Telehealth: Payer: Self-pay | Admitting: Family Medicine

## 2014-03-02 MED ORDER — HYDROCODONE-HOMATROPINE 5-1.5 MG/5ML PO SYRP: ORAL_SOLUTION | ORAL | Status: DC

## 2014-03-02 NOTE — Telephone Encounter (Signed)
[facility] [facility] - Client [place] [facility] Patient Name: Taylor Mccall DOB: 08-11-1945 Initial Comment Caller states she is needing some cough syrup and she has a bad cold. Nurse Assessment Nurse: Rock Nephew, RN, Juliann Pulse Date/Time (Eastern Time): 03/02/2014 9:31:23 AM Confirm and document reason for call. If symptomatic, describe symptoms. ---Caller states she is needing some cough syrup and she has a bad cold. Has the patient traveled out of the country within the last 30 days? ---Not Applicable Does the patient require triage? ---Yes Related visit to physician within the last 2 weeks? ---No Does the PT have any chronic conditions? (i.e. diabetes, asthma, etc.) ---Yes List chronic conditions. ---Knee Replacement 2015, RA Guidelines Guideline Title Affirmed Question Affirmed Notes Cough - Acute Productive ALSO, mild central chest pain occurs only when coughing Final Disposition User [place], RN, Juliann Pulse

## 2014-03-02 NOTE — Telephone Encounter (Signed)
Okay per Dr Sherren Mocha

## 2014-03-02 NOTE — Telephone Encounter (Signed)
Pls advise.  

## 2014-04-09 ENCOUNTER — Telehealth: Payer: Self-pay | Admitting: Family Medicine

## 2014-04-09 MED ORDER — POTASSIUM CHLORIDE CRYS ER 20 MEQ PO TBCR: 40.0000 meq | EXTENDED_RELEASE_TABLET | Freq: Every morning | ORAL | Status: DC

## 2014-04-09 NOTE — Telephone Encounter (Signed)
Pt request refill of the following: potassium chloride SA (K-DUR,KLOR-CON) 20 MEQ tablet     Phamacy: [address]

## 2014-05-12 ENCOUNTER — Other Ambulatory Visit: Payer: Self-pay | Admitting: Family Medicine

## 2014-05-12 MED ORDER — SERTRALINE HCL 100 MG PO TABS: 100.0000 mg | ORAL_TABLET | Freq: Every morning | ORAL | Status: DC

## 2014-05-12 MED ORDER — FUROSEMIDE 20 MG PO TABS: 40.0000 mg | ORAL_TABLET | Freq: Every day | ORAL | Status: DC

## 2014-05-12 NOTE — Telephone Encounter (Signed)
Pt forgot to do her mail order,and would like rx sent to local [facility]/ [address]  sertraline (ZOLOFT) 100 MG tablet furosemide (LASIX) 20 MG tablet 90 day each

## 2014-05-12 NOTE — Telephone Encounter (Signed)
rx sent in electronically 

## 2014-06-17 ENCOUNTER — Ambulatory Visit (INDEPENDENT_AMBULATORY_CARE_PROVIDER_SITE_OTHER): Payer: Medicare HMO | Admitting: Family Medicine

## 2014-06-17 ENCOUNTER — Encounter: Payer: Self-pay | Admitting: Family Medicine

## 2014-06-17 VITALS — BP 110/70 | Temp 98.1°F | Ht 68.75 in | Wt 259.0 lb

## 2014-06-17 DIAGNOSIS — I10 Essential (primary) hypertension: Secondary | ICD-10-CM

## 2014-06-17 DIAGNOSIS — N3941 Urge incontinence: Secondary | ICD-10-CM | POA: Diagnosis not present

## 2014-06-17 DIAGNOSIS — R269 Unspecified abnormalities of gait and mobility: Secondary | ICD-10-CM

## 2014-06-17 DIAGNOSIS — I872 Venous insufficiency (chronic) (peripheral): Secondary | ICD-10-CM | POA: Diagnosis not present

## 2014-06-17 LAB — CBC WITH DIFFERENTIAL/PLATELET
BASOS ABS: 0 10*3/uL (ref 0.0–0.1)
Basophils Relative: 1 % (ref 0.0–3.0)
EOS ABS: 0.3 10*3/uL (ref 0.0–0.7)
EOS PCT: 9.1 % — AB (ref 0.0–5.0)
HEMATOCRIT: 35.4 % — AB (ref 36.0–46.0)
HEMOGLOBIN: 12.2 g/dL (ref 12.0–15.0)
Lymphocytes Relative: 27.5 % (ref 12.0–46.0)
Lymphs Abs: 1 10*3/uL (ref 0.7–4.0)
MCHC: 34.4 g/dL (ref 30.0–36.0)
MCV: 85.8 fl (ref 78.0–100.0)
Monocytes Absolute: 0.3 10*3/uL (ref 0.1–1.0)
Monocytes Relative: 8.3 % (ref 3.0–12.0)
Neutro Abs: 2 10*3/uL (ref 1.4–7.7)
Neutrophils Relative %: 54.1 % (ref 43.0–77.0)
Platelets: 242 10*3/uL (ref 150.0–400.0)
RBC: 4.12 Mil/uL (ref 3.87–5.11)
RDW: 13.8 % (ref 11.5–15.5)
WBC: 3.7 10*3/uL — AB (ref 4.0–10.5)

## 2014-06-17 LAB — POCT URINALYSIS DIPSTICK
Bilirubin, UA: NEGATIVE
Blood, UA: NEGATIVE
Glucose, UA: NEGATIVE
Ketones, UA: NEGATIVE
NITRITE UA: POSITIVE
PH UA: 5.5
Spec Grav, UA: >=1.03
Urobilinogen, UA: 0.2

## 2014-06-17 LAB — BASIC METABOLIC PANEL
BUN: 13 mg/dL (ref 6–23)
CHLORIDE: 107 meq/L (ref 96–112)
CO2: 29 mEq/L (ref 19–32)
Calcium: 9.6 mg/dL (ref 8.4–10.5)
Creatinine, Ser: 0.69 mg/dL (ref 0.40–1.20)
GFR: 107.95 mL/min (ref >60.00)
Glucose, Bld: 85 mg/dL (ref 70–99)
POTASSIUM: 3.6 meq/L (ref 3.5–5.1)
Sodium: 141 mEq/L (ref 135–145)

## 2014-06-17 LAB — TSH: TSH: 1.82 u[IU]/mL (ref 0.35–4.50)

## 2014-06-17 MED ORDER — FUROSEMIDE 20 MG PO TABS: 40.0000 mg | ORAL_TABLET | Freq: Every day | ORAL | Status: DC

## 2014-06-17 MED ORDER — SERTRALINE HCL 100 MG PO TABS: 100.0000 mg | ORAL_TABLET | Freq: Every morning | ORAL | Status: DC

## 2014-06-17 MED ORDER — POTASSIUM CHLORIDE CRYS ER 20 MEQ PO TBCR: 40.0000 meq | EXTENDED_RELEASE_TABLET | Freq: Every morning | ORAL | Status: DC

## 2014-06-17 NOTE — Patient Instructions (Signed)
Continue current medications  Follow-up in one year sooner if any problems  Taylor Mccall is our new adult Designer, jewellery

## 2014-06-17 NOTE — Progress Notes (Signed)
Subjective:    Patient ID: Taylor Mccall, female    DOB: 1945/05/08, 69 y.o.   MRN: [id number]  HPI [name] is a 69 year old single female nonsmoker who comes in today for general physical examination because of a history of chronic venous insufficiency, obesity, rheumatoid arthritis, gait disturbance, essential hypertension, urinary incontinence which is getting worse, status post right knee replacement fall 2015.  She had her knee replacement in the fall 2015 however she's not been able to ambulate any better now than she did before the operation. She says your things better as it doesn't hurt as much. She's had a neurologic workup for the gait disturbance nose etiology was discovered  Med list reviewed on change. Continues to get plaque Wynell 200 mg twice a day from her rheumatologist.  She gets routine eye care cousin macular degeneration in her left eye, regular dental care, does not do BSE monthly, mammogram 2016, colonoscopy 2005. Asked her to call her gastroenterologist to find out when she is due for her next colonoscopy  Vaccinations up-to-date  Cognitive function normal she does not walk daily because she is overweight has osteoarthritis and a gait disturbance, home health safety reviewed no issues identified, no guns in the house, she does have a healthcare power of attorney and living well.  Social history her daughter now lives with her.   Review of Systems  Constitutional: Negative.   HENT: Negative.   Eyes: Negative.   Respiratory: Negative.   Cardiovascular: Negative.   Gastrointestinal: Negative.   Endocrine: Negative.   Genitourinary: Negative.   Musculoskeletal: Negative.   Skin: Negative.   Allergic/Immunologic: Negative.   Neurological: Negative.   Hematological: Negative.   Psychiatric/Behavioral: Negative.        Objective:   Physical Exam  Constitutional: She appears well-developed and well-nourished.  HENT:  Head: Normocephalic and atraumatic.   Right Ear: External ear normal.  Left Ear: External ear normal.  Nose: Nose normal.  Mouth/Throat: Oropharynx is clear and moist.  Eyes: EOM are normal. Pupils are equal, round, and reactive to light.  Neck: Normal range of motion. Neck supple. No JVD present. No tracheal deviation present. No thyromegaly present.  Cardiovascular: Normal rate, regular rhythm, normal heart sounds and intact distal pulses.  Exam reveals no gallop and no friction rub.   No murmur heard. Pulmonary/Chest: Effort normal and breath sounds normal. No stridor. No respiratory distress. She has no wheezes. She has no rales. She exhibits no tenderness.  Abdominal: Soft. Bowel sounds are normal. She exhibits no distension and no mass. There is no tenderness. There is no rebound and no guarding.  Genitourinary:  Bilateral breast exam normal except for scars from previous reduction mammoplasty  Musculoskeletal: Normal range of motion.  Lymphadenopathy:    She has no cervical adenopathy.  Neurological: She is alert. She has normal reflexes. No cranial nerve deficit. She exhibits normal muscle tone. Coordination normal.  Skin: Skin is warm and dry. No rash noted. No erythema. No pallor.  Total body skin exam normal well-healed scar left knee from total knee replacement many years ago. He healed scar right knee from previous knee replacement fall 2015  Psychiatric: She has a normal mood and affect. Her behavior is normal. Judgment and thought content normal.          Assessment & Plan:  Obesity........... again discussed diet exercise and weight loss  Venous insufficiency..... Continue Lasix 20 mg twice a day  Rheumatoid arthritis........ followed by rheumatology  Status post right  total knee replacement fall 2015 no improvement in gait but decrease in pain  History of hypertension........ BP normal 110/70  Urinary incontinence getting worse........ urology consult

## 2014-06-17 NOTE — Progress Notes (Signed)
Pre visit review using our clinic review tool, if applicable. No additional management support is needed unless otherwise documented below in the visit note. 

## 2014-07-09 ENCOUNTER — Encounter: Payer: Self-pay | Admitting: Internal Medicine

## 2014-07-14 ENCOUNTER — Other Ambulatory Visit: Payer: Self-pay | Admitting: Pulmonary Disease

## 2014-07-14 ENCOUNTER — Encounter: Payer: Self-pay | Admitting: Pulmonary Disease

## 2014-07-14 ENCOUNTER — Ambulatory Visit (INDEPENDENT_AMBULATORY_CARE_PROVIDER_SITE_OTHER): Payer: Medicare HMO | Admitting: Pulmonary Disease

## 2014-07-14 VITALS — BP 110/72 | HR 67 | Temp 97.8°F | Ht 70.0 in | Wt 258.0 lb

## 2014-07-14 DIAGNOSIS — G4733 Obstructive sleep apnea (adult) (pediatric): Secondary | ICD-10-CM

## 2014-07-14 NOTE — Progress Notes (Signed)
   Subjective:    Patient ID: Taylor Mccall, female    DOB: 1945-07-13, 69 y.o.   MRN: [id number]  HPI The patient comes in today for follow-up of her obstructive sleep apnea. She is having ongoing issues with the humidifier, and is really unclear how to make adjustments. This was addressed with her at the last visit, but she did not retain the information. He is also having issues with mask leak, and this is leading toward decreased compliance. Her download shows excellent control of her AHI, and she wears the device about 5-1/2 hours on the nights that she does try to wear the device. The patient's weight has increased over 10 pounds since the last visit.   Review of Systems  Constitutional: Negative for fever and unexpected weight change.  HENT: Negative for congestion, dental problem, ear pain, nosebleeds, postnasal drip, rhinorrhea, sinus pressure, sneezing, sore throat and trouble swallowing.   Eyes: Negative for redness and itching.  Respiratory: Positive for shortness of breath. Negative for cough, chest tightness and wheezing.   Cardiovascular: Positive for leg swelling. Negative for palpitations.  Gastrointestinal: Positive for nausea. Negative for vomiting.  Genitourinary: Negative for dysuria.  Musculoskeletal: Negative for joint swelling.  Skin: Negative for rash.  Neurological: Negative for headaches.  Hematological: Does not bruise/bleed easily.  Psychiatric/Behavioral: Negative for dysphoric mood. The patient is not nervous/anxious.        Objective:   Physical Exam Obese female in no acute distress Nose without purulence or discharge noted Neck without lymphadenopathy or thyromegaly No skin breakdown or pressure necrosis from the C Pap mask Lower extremities with 1-2+ edema noted, no cyanosis Alert and oriented, does not appear to be sleepy, moves all 4 extremities.       Assessment & Plan:

## 2014-07-14 NOTE — Assessment & Plan Note (Signed)
The patient does very well by her download when she wears her device on a consistent basis, but is continuing to have issues with humidity and mask leaking. This leads to decrease compliance. I will get her back with her home care company to do an extensive review of the humidification system and how to adjust, and will also get them to work with her on her mask fit. I also encouraged her to work aggressively on weight loss.

## 2014-07-14 NOTE — Patient Instructions (Signed)
Will get you back with your home care company to help with humidity issues and mask leak Keep working on weight loss followup with Dr. Halford Chessman in 1 year, but call if you continue to have issues with cpap.

## 2014-07-23 ENCOUNTER — Telehealth: Payer: Self-pay | Admitting: Family Medicine

## 2014-07-23 MED ORDER — SERTRALINE HCL 100 MG PO TABS: 100.0000 mg | ORAL_TABLET | Freq: Every morning | ORAL | Status: DC

## 2014-07-23 MED ORDER — FUROSEMIDE 20 MG PO TABS: 40.0000 mg | ORAL_TABLET | Freq: Every day | ORAL | Status: DC

## 2014-07-23 NOTE — Telephone Encounter (Signed)
Refills sent

## 2014-07-23 NOTE — Telephone Encounter (Signed)
Pt needs refills on furosemide #90w/refills  and sertraline 100 mg #90w/refills call into [facility]

## 2014-07-27 ENCOUNTER — Telehealth: Payer: Self-pay | Admitting: Family Medicine

## 2014-07-27 NOTE — Telephone Encounter (Signed)
Pt had a urine screening at the rheumatologist office and bacteria was found. The office told her that they sent over the results to Dr Sherren Mocha. Pt call to ask what she need to do.

## 2014-07-27 NOTE — Telephone Encounter (Signed)
appointment made and copy of labs given to [facility]

## 2014-07-28 ENCOUNTER — Encounter: Payer: Self-pay | Admitting: Adult Health

## 2014-07-28 ENCOUNTER — Ambulatory Visit (INDEPENDENT_AMBULATORY_CARE_PROVIDER_SITE_OTHER): Payer: Medicare HMO | Admitting: Adult Health

## 2014-07-28 VITALS — BP 128/80 | Temp 98.0°F | Ht 70.0 in | Wt 256.2 lb

## 2014-07-28 DIAGNOSIS — N390 Urinary tract infection, site not specified: Secondary | ICD-10-CM

## 2014-07-28 DIAGNOSIS — R600 Localized edema: Secondary | ICD-10-CM | POA: Diagnosis not present

## 2014-07-28 DIAGNOSIS — R109 Unspecified abdominal pain: Secondary | ICD-10-CM

## 2014-07-28 LAB — POCT URINALYSIS DIPSTICK
BILIRUBIN UA: NEGATIVE
GLUCOSE UA: NEGATIVE
Ketones, UA: NEGATIVE
Nitrite, UA: POSITIVE
RBC UA: NEGATIVE
Spec Grav, UA: 1.025
Urobilinogen, UA: 1
pH, UA: 6

## 2014-07-28 MED ORDER — FUROSEMIDE 20 MG PO TABS: ORAL_TABLET | ORAL | Status: DC

## 2014-07-28 MED ORDER — CIPROFLOXACIN HCL 250 MG PO TABS: 250.0000 mg | ORAL_TABLET | Freq: Two times a day (BID) | ORAL | Status: DC

## 2014-07-28 NOTE — Progress Notes (Signed)
Pre visit review using our clinic review tool, if applicable. No additional management support is needed unless otherwise documented below in the visit note. 

## 2014-07-28 NOTE — Patient Instructions (Addendum)
Take the cipro twice a day for three days, this is for your UTI.   Take 60mg  of Lasix every other day and on the days you do not take the 60mg , please take 40mg .   Follow up in one month for follow up and repeat labs. Urinary Tract Infection Urinary tract infections (UTIs) can develop anywhere along your urinary tract. Your urinary tract is your body's drainage system for removing wastes and extra water. Your urinary tract includes two kidneys, two ureters, a bladder, and a urethra. Your kidneys are a pair of bean-shaped organs. Each kidney is about the size of your fist. They are located below your ribs, one on each side of your spine. CAUSES Infections are caused by microbes, which are microscopic organisms, including fungi, viruses, and bacteria. These organisms are so small that they can only be seen through a microscope. Bacteria are the microbes that most commonly cause UTIs. SYMPTOMS  Symptoms of UTIs may vary by age and gender of the patient and by the location of the infection. Symptoms in young women typically include a frequent and intense urge to urinate and a painful, burning feeling in the bladder or urethra during urination. Older women and men are more likely to be tired, shaky, and weak and have muscle aches and abdominal pain. A fever may mean the infection is in your kidneys. Other symptoms of a kidney infection include pain in your back or sides below the ribs, nausea, and vomiting. DIAGNOSIS To diagnose a UTI, your caregiver will ask you about your symptoms. Your caregiver also will ask to provide a urine sample. The urine sample will be tested for bacteria and white blood cells. White blood cells are made by your body to help fight infection. TREATMENT  Typically, UTIs can be treated with medication. Because most UTIs are caused by a bacterial infection, they usually can be treated with the use of antibiotics. The choice of antibiotic and length of treatment depend on your symptoms  and the type of bacteria causing your infection. HOME CARE INSTRUCTIONS  If you were prescribed antibiotics, take them exactly as your caregiver instructs you. Finish the medication even if you feel better after you have only taken some of the medication.  Drink enough water and fluids to keep your urine clear or pale yellow.  Avoid caffeine, tea, and carbonated beverages. They tend to irritate your bladder.  Empty your bladder often. Avoid holding urine for long periods of time.  Empty your bladder before and after sexual intercourse.  After a bowel movement, women should cleanse from front to back. Use each tissue only once. SEEK MEDICAL CARE IF:   You have back pain.  You develop a fever.  Your symptoms do not begin to resolve within 3 days. SEEK IMMEDIATE MEDICAL CARE IF:   You have severe back pain or lower abdominal pain.  You develop chills.  You have nausea or vomiting.  You have continued burning or discomfort with urination. MAKE SURE YOU:   Understand these instructions.  Will watch your condition.  Will get help right away if you are not doing well or get worse. Document Released: 11/01/2004 Document Revised: 07/24/2011 Document Reviewed: 03/02/2011 [facility] Patient Information 2015 [place], [place]. This information is not intended to replace advice given to you by your health care provider. Make sure you discuss any questions you have with your health care provider.

## 2014-07-28 NOTE — Progress Notes (Signed)
Subjective:    Patient ID: Taylor Mccall, female    DOB: 05-13-1945, 69 y.o.   MRN: [id number]  HPI Patient presents to the office for UTI. She was seen by rheumatology this week and was found to have a UTI. They would not treat it and she was told to see her PCP.   Urinalysis from rheumatology showed  Pos Nitrates Small Luekocyte WBC 7-10 Bacteria/HPF Many  Her only symptom is bilateral lower back pain.   Denies burning. Is on lasix so she is unable to tell if she is going more frequent.   She also complains of continued  lower extremity swelling, she is currently taking 40 mg Lasix for.     Review of Systems  Constitutional: Negative.   HENT: Negative.   Respiratory: Negative.   Cardiovascular: Positive for leg swelling.  Gastrointestinal: Negative.   Genitourinary: Positive for frequency and flank pain. Negative for dysuria, urgency, hematuria, vaginal discharge, vaginal pain and pelvic pain.  Musculoskeletal: Negative.   Skin: Negative.   Neurological: Negative.   All other systems reviewed and are negative.  Past Medical History  Diagnosis Date  . Obesity   . Osteoarthritis   . Urinary incontinence   . Hypertension   . Depression   . Rheumatoid arthritis(714.0)   . Gait disturbance   . Sleep apnea     cpap  . Anxiety   . Venous insufficiency   . Breast mass, left     History   Social History  . Marital Status: Widowed    Spouse Name: N/A  . Number of Children: N/A  . Years of Education: N/A   Occupational History  . Not on file.   Social History Main Topics  . Smoking status: Former Smoker -- 0.30 packs/day for 10 years    Types: Cigarettes    Quit date: 02/05/1978  . Smokeless tobacco: Not on file     Comment: over 25 years ago...pt doesnt remember when she quit.   . Alcohol Use: Yes     Comment: occassional/social  . Drug Use: No  . Sexual Activity: Not on file   Other Topics Concern  . Not on file   Social History Narrative     Past Surgical History  Procedure Laterality Date  . Bladder suspension  2009  . Total hip arthroplasty    . Vesicovaginal fistula closure w/ tah    . Toe surgery Left     Left great toe  . Replacement total knee Left   . Eye surgery    . Breast reduction surgery    . Abdominal hysterectomy    . Total knee arthroplasty Right 12/09/2013    Procedure: Right Total Knee Arthroplasty;  Surgeon: Newt Minion, MD;  Location: [place];  Service: Orthopedics;  Laterality: Right;    Family History  Problem Relation Age of Onset  . Diabetes    . Stroke    . Asthma Brother   . Coronary artery disease Father   . Coronary artery disease Brother   . Skin cancer Father     Allergies  Allergen Reactions  . Penicillins Rash    Current Outpatient Prescriptions on File Prior to Visit  Medication Sig Dispense Refill  . aspirin EC 325 MG tablet Take 1 tablet (325 mg total) by mouth daily. 30 tablet 0  . carboxymethylcellulose (REFRESH TEARS) 0.5 % SOLN Place 2 drops into the right eye as needed (for itchy eyes).    . Cholecalciferol (VITAMIN  D-3 PO) Take 1,000 mg by mouth daily.    Marland Kitchen erythromycin (ERY-TAB) 500 MG EC tablet Take 500-1,000 mg by mouth 2 (two) times daily as needed (for dental procedures). Take 1000 mg by mouth 1 hour prior to dental appointment and take 500 mg by mouth 6 hours after dental work.    . furosemide (LASIX) 20 MG tablet Take 2 tablets (40 mg total) by mouth daily. At 1300 180 tablet 3  . hydroxychloroquine (PLAQUENIL) 200 MG tablet Take 200 mg by mouth 2 (two) times daily.     . Multiple Vitamins-Minerals (PRESERVISION AREDS 2) CAPS Take 2 capsules by mouth daily.    . NON FORMULARY 1 each by Other route See admin instructions. Use CPAP nightly.    . Omega-3 Fatty Acids (FISH OIL) 1200 MG CAPS Take 2,400 mg by mouth daily.     . potassium chloride SA (K-DUR,KLOR-CON) 20 MEQ tablet Take 2 tablets (40 mEq total) by mouth every morning. 180 tablet 3  . sertraline  (ZOLOFT) 100 MG tablet Take 1 tablet (100 mg total) by mouth every morning. 90 tablet 3  . timolol (BETIMOL) 0.5 % ophthalmic solution Place 1 drop into the left eye 2 (two) times daily.    . vitamin B-12 (CYANOCOBALAMIN) 1000 MCG tablet Take 1,000 mcg by mouth daily.    . vitamin E 400 UNIT capsule Take 400 Units by mouth daily.     No current facility-administered medications on file prior to visit.    BP 128/80 mmHg  Temp(Src) 98 F (36.7 C) (Oral)  Ht 5\' 10"  (1.778 m)  Wt 256 lb 3.2 oz (116.212 kg)  BMI 36.76 kg/m2       Objective:   Physical Exam  Constitutional: She is oriented to person, place, and time. She appears well-developed and well-nourished.  Cardiovascular: Normal rate, regular rhythm, normal heart sounds and intact distal pulses.  Exam reveals no gallop and no friction rub.   No murmur heard. Lower extremity pulses palpable +1  Pulmonary/Chest: Effort normal and breath sounds normal. No respiratory distress. She has no wheezes. She has no rales. She exhibits no tenderness.  Abdominal: Soft. Bowel sounds are normal. She exhibits no distension and no mass. There is no tenderness. There is no rebound and no guarding.  Genitourinary:  No CVA tenderness  Musculoskeletal: Normal range of motion. She exhibits edema (severe non pitting edema in bilateral lower extremities). She exhibits no tenderness.  Neurological: She is alert and oriented to person, place, and time.  Skin: Skin is warm and dry. No rash noted. She is not diaphoretic. No erythema. No pallor.  Psychiatric: She has a normal mood and affect. Her behavior is normal. Judgment and thought content normal.  Vitals reviewed.      Assessment & Plan:  1. Urinary tract infection without hematuria, site unspecified - ciprofloxacin (CIPRO) 250 MG tablet; Take 1 tablet (250 mg total) by mouth 2 (two) times daily.  Dispense: 6 tablet; Refill: 0 - Culture, Urine - POCT urinalysis dipstick; Standing - POCT  urinalysis dipstick - Follow up in 3 days if no improvement 2. Bilateral lower extremity edema - Take 60mg  Lasix every other day. Take 40 mg Lasix, every other day - furosemide (LASIX) 20 MG tablet; Take 60 mg every other day.  Dispense: 90 tablet; Refill: 0 - GFR and Cr WNL - Follow up in 2-4 weeks

## 2014-07-31 LAB — URINE CULTURE: Colony Count: >=100000

## 2014-08-27 ENCOUNTER — Ambulatory Visit (INDEPENDENT_AMBULATORY_CARE_PROVIDER_SITE_OTHER): Payer: Medicare HMO | Admitting: Adult Health

## 2014-08-27 ENCOUNTER — Encounter: Payer: Self-pay | Admitting: Adult Health

## 2014-08-27 VITALS — BP 124/76 | Temp 98.7°F | Ht 70.0 in | Wt 259.1 lb

## 2014-08-27 DIAGNOSIS — R600 Localized edema: Secondary | ICD-10-CM | POA: Diagnosis not present

## 2014-08-27 LAB — BASIC METABOLIC PANEL
BUN: 13 mg/dL (ref 6–23)
CO2: 29 mEq/L (ref 19–32)
Calcium: 9.6 mg/dL (ref 8.4–10.5)
Chloride: 104 mEq/L (ref 96–112)
Creatinine, Ser: 0.81 mg/dL (ref 0.40–1.20)
GFR: 89.67 mL/min (ref >60.00)
Glucose, Bld: 91 mg/dL (ref 70–99)
Potassium: 4 mEq/L (ref 3.5–5.1)
Sodium: 139 mEq/L (ref 135–145)

## 2014-08-27 NOTE — Patient Instructions (Signed)
It was great seeing you today!  I will follow up with you regarding your labs. Someone from cardiology will call you to set up an appointment.   Please let me know if you need anything.

## 2014-08-27 NOTE — Progress Notes (Signed)
Pre visit review using our clinic review tool, if applicable. No additional management support is needed unless otherwise documented below in the visit note. 

## 2014-08-27 NOTE — Progress Notes (Signed)
Subjective:    Patient ID: Taylor Mccall, female    DOB: 02/17/1945, 69 y.o.   MRN: [id number]  HPI  69 year old female who presents to the office today for 4 week follow up regarding lower extremity edema and a UTI. We increased her Lasix and started her on Cipro for the UTI. She endorses that she continues to have lower extremity edema. She takes her Lasix every day. The swelling goes away at night when her legs are elevated, but returns during the morning. She denies any skin breakdown.   She denies any CP, but does have SOB on exertion from time to time. Her diet consists of a lot of fried foods. She has no claudication.   She has no additional complaints at this time.    Review of Systems  Constitutional: Negative.   HENT: Negative.   Respiratory: Positive for shortness of breath. Negative for cough and wheezing.   Cardiovascular: Positive for leg swelling.  Gastrointestinal: Negative.   Genitourinary: Negative.   Musculoskeletal: Negative.   Neurological: Negative.   Hematological: Negative.    Past Medical History  Diagnosis Date  . Obesity   . Osteoarthritis   . Urinary incontinence   . Hypertension   . Depression   . Rheumatoid arthritis(714.0)   . Gait disturbance   . Sleep apnea     cpap  . Anxiety   . Venous insufficiency   . Breast mass, left     History   Social History  . Marital Status: Widowed    Spouse Name: N/A  . Number of Children: N/A  . Years of Education: N/A   Occupational History  . Not on file.   Social History Main Topics  . Smoking status: Former Smoker -- 0.30 packs/day for 10 years    Types: Cigarettes    Quit date: 02/05/1978  . Smokeless tobacco: Not on file     Comment: over 25 years ago...pt doesnt remember when she quit.   . Alcohol Use: Yes     Comment: occassional/social  . Drug Use: No  . Sexual Activity: Not on file   Other Topics Concern  . Not on file   Social History Narrative    Past Surgical  History  Procedure Laterality Date  . Bladder suspension  2009  . Total hip arthroplasty    . Vesicovaginal fistula closure w/ tah    . Toe surgery Left     Left great toe  . Replacement total knee Left   . Eye surgery    . Breast reduction surgery    . Abdominal hysterectomy    . Total knee arthroplasty Right 12/09/2013    Procedure: Right Total Knee Arthroplasty;  Surgeon: Newt Minion, MD;  Location: [place];  Service: Orthopedics;  Laterality: Right;  . Esophagogastroduodenoscopy    . Colonoscopy      Family History  Problem Relation Age of Onset  . Diabetes    . Stroke    . Asthma Brother   . Coronary artery disease Father   . Coronary artery disease Brother   . Skin cancer Father     Allergies  Allergen Reactions  . Penicillins Rash    Current Outpatient Prescriptions on File Prior to Visit  Medication Sig Dispense Refill  . aspirin EC 325 MG tablet Take 1 tablet (325 mg total) by mouth daily. 30 tablet 0  . carboxymethylcellulose (REFRESH TEARS) 0.5 % SOLN Place 2 drops into the right eye as  needed (for itchy eyes).    . Cholecalciferol (VITAMIN D-3 PO) Take 1,000 mg by mouth daily.    . ciprofloxacin (CIPRO) 250 MG tablet Take 1 tablet (250 mg total) by mouth 2 (two) times daily. 6 tablet 0  . erythromycin (ERY-TAB) 500 MG EC tablet Take 500-1,000 mg by mouth 2 (two) times daily as needed (for dental procedures). Take 1000 mg by mouth 1 hour prior to dental appointment and take 500 mg by mouth 6 hours after dental work.    . furosemide (LASIX) 20 MG tablet Take 2 tablets (40 mg total) by mouth daily. At 1300 180 tablet 3  . furosemide (LASIX) 20 MG tablet Take 60 mg every other day. 90 tablet 0  . hydroxychloroquine (PLAQUENIL) 200 MG tablet Take 200 mg by mouth 2 (two) times daily.     . Multiple Vitamins-Minerals (PRESERVISION AREDS 2) CAPS Take 2 capsules by mouth daily.    . NON FORMULARY 1 each by Other route See admin instructions. Use CPAP nightly.    .  Omega-3 Fatty Acids (FISH OIL) 1200 MG CAPS Take 2,400 mg by mouth daily.     . potassium chloride SA (K-DUR,KLOR-CON) 20 MEQ tablet Take 2 tablets (40 mEq total) by mouth every morning. 180 tablet 3  . sertraline (ZOLOFT) 100 MG tablet Take 1 tablet (100 mg total) by mouth every morning. 90 tablet 3  . timolol (BETIMOL) 0.5 % ophthalmic solution Place 1 drop into the left eye 2 (two) times daily.    . vitamin B-12 (CYANOCOBALAMIN) 1000 MCG tablet Take 1,000 mcg by mouth daily.    . vitamin E 400 UNIT capsule Take 400 Units by mouth daily.     No current facility-administered medications on file prior to visit.    BP 124/76 mmHg  Temp(Src) 98.7 F (37.1 C) (Oral)  Ht 5\' 10"  (1.778 m)  Wt 259 lb 1.6 oz (117.527 kg)  BMI 37.18 kg/m2       Objective:   Physical Exam  Constitutional: She is oriented to person, place, and time. She appears well-developed and well-nourished. No distress.  Cardiovascular: Normal rate, regular rhythm, normal heart sounds and intact distal pulses.  Exam reveals no gallop and no friction rub.   No murmur heard. Pulmonary/Chest: Effort normal and breath sounds normal. No respiratory distress. She has no wheezes. She has no rales. She exhibits no tenderness.  Musculoskeletal: Normal range of motion. She exhibits edema (non pitting edema to bilateral lower extremity). She exhibits no tenderness.  Neurological: She is alert and oriented to person, place, and time.  Skin: Skin is warm and dry. No rash noted. She is not diaphoretic. No erythema. No pallor.  Psychiatric: She has a normal mood and affect. Her behavior is normal. Judgment and thought content normal.  Nursing note and vitals reviewed.      Assessment & Plan:  1. Bilateral edema of lower extremity - Likely from venous insufficiency. Cannot rule out cardiac or lymphadema as causes of lower extremity swelling - Basic metabolic panel - Ambulatory referral to Cardiology - Cut out fried food and soda  from diet.  - Keep legs elevated at night.  - Follow up as needed

## 2014-09-20 ENCOUNTER — Encounter: Payer: Self-pay | Admitting: Internal Medicine

## 2014-09-20 ENCOUNTER — Ambulatory Visit (INDEPENDENT_AMBULATORY_CARE_PROVIDER_SITE_OTHER): Payer: Medicare HMO | Admitting: Internal Medicine

## 2014-09-20 VITALS — BP 118/74 | HR 80 | Ht 69.0 in | Wt 258.0 lb

## 2014-09-20 DIAGNOSIS — R1013 Epigastric pain: Secondary | ICD-10-CM

## 2014-09-20 DIAGNOSIS — IMO0001 Reserved for inherently not codable concepts without codable children: Secondary | ICD-10-CM

## 2014-09-20 DIAGNOSIS — R143 Flatulence: Secondary | ICD-10-CM

## 2014-09-20 MED ORDER — PANTOPRAZOLE SODIUM 40 MG PO TBEC: 40.0000 mg | DELAYED_RELEASE_TABLET | Freq: Every day | ORAL | Status: DC

## 2014-09-20 NOTE — Progress Notes (Signed)
   Subjective:    Patient ID: Taylor Mccall, female    DOB: [birth year], 69 y.o.   MRN: [id number] Cc: abdominal pain, gas HPI The patient is c/o 1+ yr hx epiogastric burning, bloating, nausea and belching and flatus. Daily sxs. Weight is up. Rare vomiting or regurgitation. No dysphagia. Diet includes large amounts of greens, cruciferous vegetables. Does not have problems after milk or ice cream. No constipation or diarrhea.  Medications, allergies, past medical history, past surgical history, family history and social history are reviewed and updated in the EMR.    Review of Systems Diffusely + - insomnia, joint pains, urinary leakage, decreased hearing, fatigue, night sweats, low back pain, anxious and derpression sxs, pedal edema,. All other ROS negative or as per HPI    Objective:   Physical Exam @BP  118/74 mmHg  Pulse 80  Ht 5\' 9"  (1.753 m)  Wt 258 lb (117.028 kg)  BMI 38.08 kg/m2@  General:  Well-developed, well-nourished and in no acute distress - obese Eyes:  anicteric. Lungs: Clear to auscultation bilaterally. Heart:  S1S2, no rubs, murmurs, gallops. Abdomen:  obese soft, non-tender, no hepatosplenomegaly, hernia, or mass and BS+.  Extremities:   no edema, cyanosis or clubbing but rheumatoid changes hands and wrists bilaterally Skin   no rash. Neuro:  A&O x 3.  Psych:  appropriate mood and  Affect.   Data Reviewed: 2016 labs in EMR - NL HGb, NL BMET, TSH NL EGD 2005 Colonoscopy 2005 3 mm adenoma and no polyps but + diverticulosis and hemorrhoids 2010      Assessment & Plan:  Dyspepsia  Gas  Start PPI - pantoprazole 40 mg qd Call back 4-6 weeks w/ update and if not better EGD likely The risks and benefits as well as alternatives of endoscopic procedure(s) have been discussed and reviewed. All questions answered. The patient agrees to proceed.   Gas and flatulence diet  I appreciate the opportunity to care for her.  MC:NOBS,JGGEZMO Zenia Resides, MD

## 2014-09-20 NOTE — Patient Instructions (Signed)
We have sent the following medications to your pharmacy for you to pick up at your convenience: Pantoprazole   Please call back late September/early October to let us know how you are doing.   You have also been given a pamphlet on Gas and flatulence diet.   Thank you for the opportunity to participate in your care.   Silvano Rusk, MD, [facility]

## 2014-09-23 ENCOUNTER — Telehealth: Payer: Self-pay | Admitting: Family Medicine

## 2014-09-23 NOTE — Telephone Encounter (Signed)
Pt was callback and transferred to [facility]

## 2014-09-23 NOTE — Telephone Encounter (Signed)
Spoke to patient. Patient states she has had a cough since Monday. Does not feel worse, just constant cough. Says cough is productive but only clear stuff coming out. Denies chest pain, SHOB, and chills. Patient states she does not want to go to ED so I asked if she was wiling to come into office to be seen tomorrow and she agreed.

## 2014-09-23 NOTE — Telephone Encounter (Signed)
Pt would like rachel to return her call. Pt has a cold and cough. I offerred pt an appt. Pt states she is 34 miles away.

## 2014-09-23 NOTE — Telephone Encounter (Signed)
Patient Name: Taylor Mccall DOB: [birth year] Initial Comment Caller states she has cough. Nurse Assessment Nurse: Vallery Sa, RN, Cathy Date/Time (Eastern Time): 09/23/2014 1:57:45 PM Confirm and document reason for call. If symptomatic, describe symptoms. ---Caller states she developed productive cough 3 days ago. She developed wheezing yesterday. No fever. Has the patient traveled out of the country within the last 30 days? ---No Does the patient require triage? ---Yes Related visit to physician within the last 2 weeks? ---No Does the PT have any chronic conditions? (i.e. diabetes, asthma, etc.) ---Yes List chronic conditions. ---Bladder problems, Gastric Reflux Guidelines Guideline Title Affirmed Question Affirmed Notes Breathing Difficulty [1] MODERATE difficulty breathing (e.g., speaks in phrases, SOB even at rest, pulse 100-120) AND [2] NEW-onset or WORSE than normal Final Disposition User Go to ED Now Vallery Sa, RN, [facility] - ED Disagree/Comply: Comply

## 2014-09-24 ENCOUNTER — Ambulatory Visit (INDEPENDENT_AMBULATORY_CARE_PROVIDER_SITE_OTHER): Payer: Medicare HMO | Admitting: Adult Health

## 2014-09-24 ENCOUNTER — Encounter: Payer: Self-pay | Admitting: Adult Health

## 2014-09-24 VITALS — BP 102/70 | HR 100 | Temp 98.1°F | Ht 69.0 in | Wt 256.8 lb

## 2014-09-24 DIAGNOSIS — J069 Acute upper respiratory infection, unspecified: Secondary | ICD-10-CM

## 2014-09-24 DIAGNOSIS — J209 Acute bronchitis, unspecified: Secondary | ICD-10-CM | POA: Diagnosis not present

## 2014-09-24 MED ORDER — PREDNISONE 20 MG PO TABS: 20.0000 mg | ORAL_TABLET | Freq: Every day | ORAL | Status: DC

## 2014-09-24 MED ORDER — IPRATROPIUM-ALBUTEROL 0.5-2.5 (3) MG/3ML IN SOLN
3.0000 mL | Freq: Once | RESPIRATORY_TRACT | Status: AC
  Administered 2014-09-24: 3 mL via RESPIRATORY_TRACT

## 2014-09-24 MED ORDER — BENZONATATE 200 MG PO CAPS: 200.0000 mg | ORAL_CAPSULE | Freq: Three times a day (TID) | ORAL | Status: DC | PRN

## 2014-09-24 MED ORDER — HYDROCODONE-HOMATROPINE 5-1.5 MG/5ML PO SYRP: 5.0000 mL | ORAL_SOLUTION | Freq: Three times a day (TID) | ORAL | Status: DC | PRN

## 2014-09-24 MED ORDER — ALBUTEROL SULFATE HFA 108 (90 BASE) MCG/ACT IN AERS: 2.0000 | INHALATION_SPRAY | Freq: Four times a day (QID) | RESPIRATORY_TRACT | Status: DC | PRN

## 2014-09-24 NOTE — Progress Notes (Signed)
Pre visit review using our clinic review tool, if applicable. No additional management support is needed unless otherwise documented below in the visit note. 

## 2014-09-24 NOTE — Patient Instructions (Addendum)
It was nice seeing you today and I am sorry you are feeling bad.   Take the prednisone as directed:  Day 1 40mg  Day 2 40 mg Day 3 40 mg Day 4 20 mg Day 5 20 mg Day 6 20 mg  Use the cough syrup at night as it will make you sleepy   Use the Tessalon Pearls for the cough during the day  Use the inhaler as needed.

## 2014-09-24 NOTE — Progress Notes (Signed)
Subjective:    Patient ID: Taylor Mccall, female    DOB: 1945/05/13, 69 y.o.   MRN: [id number]  Cough This is a new problem. The current episode started in the past 7 days. The problem has been unchanged. The problem occurs constantly. The cough is productive of sputum. Associated symptoms include ear pain, headaches, rhinorrhea, a sore throat and wheezing. Pertinent negatives include no chills or fever. The treatment provided no relief.    Review of Systems  Constitutional: Positive for fatigue. Negative for fever, chills, activity change and appetite change.  HENT: Positive for ear pain, rhinorrhea and sore throat. Negative for trouble swallowing.   Eyes: Negative.   Respiratory: Positive for cough and wheezing.   Cardiovascular: Negative.   Musculoskeletal: Positive for arthralgias.  Neurological: Positive for headaches. Negative for dizziness, weakness and light-headedness.   Past Medical History  Diagnosis Date  . Obesity   . Osteoarthritis   . Urinary incontinence   . Hypertension   . Depression   . Rheumatoid arthritis(714.0)   . Gait disturbance   . Sleep apnea     cpap  . Anxiety   . Venous insufficiency   . Breast mass, left     Social History   Social History  . Marital Status: Widowed    Spouse Name: N/A  . Number of Children: N/A  . Years of Education: N/A   Occupational History  . Not on file.   Social History Main Topics  . Smoking status: Former Smoker -- 0.30 packs/day for 10 years    Types: Cigarettes    Quit date: 02/05/1978  . Smokeless tobacco: Not on file     Comment: over 25 years ago...pt doesnt remember when she quit.   . Alcohol Use: Yes     Comment: occassional/social  . Drug Use: No  . Sexual Activity: Not on file   Other Topics Concern  . Not on file   Social History Narrative    Past Surgical History  Procedure Laterality Date  . Bladder suspension  2009  . Total hip arthroplasty    . Vesicovaginal fistula closure  w/ tah    . Toe surgery Left     Left great toe  . Replacement total knee Left   . Eye surgery    . Breast reduction surgery    . Abdominal hysterectomy    . Total knee arthroplasty Right 12/09/2013    Procedure: Right Total Knee Arthroplasty;  Surgeon: Newt Minion, MD;  Location: [facility];  Service: Orthopedics;  Laterality: Right;  . Esophagogastroduodenoscopy    . Colonoscopy    . Bladder surgery      sling    Family History  Problem Relation Age of Onset  . Diabetes    . Stroke    . Asthma Brother   . Coronary artery disease Father   . Coronary artery disease Brother   . Skin cancer Father     Allergies  Allergen Reactions  . Penicillins Rash    Current Outpatient Prescriptions on File Prior to Visit  Medication Sig Dispense Refill  . aspirin EC 325 MG tablet Take 1 tablet (325 mg total) by mouth daily. 30 tablet 0  . carboxymethylcellulose (REFRESH TEARS) 0.5 % SOLN Place 2 drops into the right eye as needed (for itchy eyes).    . Cholecalciferol (VITAMIN D-3 PO) Take 1,000 mg by mouth daily.    Marland Kitchen erythromycin (ERY-TAB) 500 MG EC tablet Take 500-1,000 mg by mouth  2 (two) times daily as needed (for dental procedures). Take 1000 mg by mouth 1 hour prior to dental appointment and take 500 mg by mouth 6 hours after dental work.    . furosemide (LASIX) 20 MG tablet Take 60 mg every other day. 90 tablet 0  . hydroxychloroquine (PLAQUENIL) 200 MG tablet Take 200 mg by mouth 2 (two) times daily.     . Multiple Vitamins-Minerals (PRESERVISION AREDS 2) CAPS Take 2 capsules by mouth daily.    . NON FORMULARY 1 each by Other route See admin instructions. Use CPAP nightly.    . pantoprazole (PROTONIX) 40 MG tablet Take 1 tablet (40 mg total) by mouth daily before breakfast. 30 tablet 11  . potassium chloride SA (K-DUR,KLOR-CON) 20 MEQ tablet Take 2 tablets (40 mEq total) by mouth every morning. 180 tablet 3  . sertraline (ZOLOFT) 100 MG tablet Take 1 tablet (100 mg total) by mouth  every morning. 90 tablet 3  . timolol (BETIMOL) 0.5 % ophthalmic solution Place 1 drop into the left eye 2 (two) times daily.    Marland Kitchen trimethoprim (TRIMPEX) 100 MG tablet Take 100 mg by mouth 2 (two) times daily.    . vitamin B-12 (CYANOCOBALAMIN) 1000 MCG tablet Take 1,000 mcg by mouth daily.    . Vitamin D, Cholecalciferol, 1000 UNITS TABS Take 1,000 mg by mouth 1 day or 1 dose.    . vitamin E 400 UNIT capsule Take 400 Units by mouth daily.     No current facility-administered medications on file prior to visit.    BP 102/70 mmHg  Pulse 100  Temp(Src) 98.1 F (36.7 C) (Oral)  Ht 5\' 9"  (1.753 m)  Wt 256 lb 12.8 oz (116.484 kg)  BMI 37.91 kg/m2  SpO2 98%       Objective:   Physical Exam  Constitutional: She is oriented to person, place, and time.  HENT:  Head: Normocephalic and atraumatic.  Right Ear: External ear normal.  Left Ear: External ear normal.  Nose: Nose normal.  Mouth/Throat: Oropharynx is clear and moist. No oropharyngeal exudate.  TM's visualized. No signs of infection  Eyes: Right eye exhibits no discharge. Left eye exhibits no discharge.  Cardiovascular: Normal rate, regular rhythm, normal heart sounds and intact distal pulses.  Exam reveals no gallop and no friction rub.   No murmur heard. Pulmonary/Chest: Effort normal. No respiratory distress. She has wheezes (expiratory at bases). She has no rales. She exhibits no tenderness.  Musculoskeletal: Normal range of motion. She exhibits no edema or tenderness.  Lymphadenopathy:    She has no cervical adenopathy.  Neurological: She is alert and oriented to person, place, and time.  Skin: Skin is warm and dry. No rash noted. No erythema. No pallor.  Psychiatric: She has a normal mood and affect. Her behavior is normal. Thought content normal.  Nursing note and vitals reviewed.     Assessment & Plan:  1. Acute bronchitis, unspecified organism - Likely viral  - predniSONE (DELTASONE) 20 MG tablet; Take 1 tablet  (20 mg total) by mouth daily with breakfast.  Dispense: 9 tablet; Refill: 0. Take 40 mg x 3 days, 20 mg x 3 days.  - albuterol (PROVENTIL HFA;VENTOLIN HFA) 108 (90 BASE) MCG/ACT inhaler; Inhale 2 puffs into the lungs every 6 (six) hours as needed for wheezing or shortness of breath.  Dispense: 1 Inhaler; Refill: 2 - benzonatate (TESSALON) 200 MG capsule; Take 1 capsule (200 mg total) by mouth 3 (three) times daily as needed  for cough.  Dispense: 20 capsule; Refill: 0 - HYDROcodone-homatropine (HYCODAN) 5-1.5 MG/5ML syrup; Take 5 mLs by mouth every 8 (eight) hours as needed for cough.  Dispense: 120 mL; Refill: 0 - ipratropium-albuterol (DUONEB) 0.5-2.5 (3) MG/3ML nebulizer solution 3 mL; Take 3 mLs by nebulization once. Wheezing cleared after duoneb - Follow up if no improvement in 2-3 days.   2. URI (upper respiratory infection) - Likely viral in nature - Claritin and Flonase for symptom relief.

## 2014-09-27 NOTE — Telephone Encounter (Signed)
Pt has been sch

## 2014-10-15 ENCOUNTER — Encounter: Payer: Self-pay | Admitting: Cardiology

## 2014-10-15 ENCOUNTER — Ambulatory Visit (INDEPENDENT_AMBULATORY_CARE_PROVIDER_SITE_OTHER): Payer: Medicare HMO | Admitting: Cardiology

## 2014-10-15 VITALS — BP 116/76 | HR 69 | Ht 70.0 in | Wt 258.4 lb

## 2014-10-15 DIAGNOSIS — I872 Venous insufficiency (chronic) (peripheral): Secondary | ICD-10-CM

## 2014-10-15 DIAGNOSIS — R0600 Dyspnea, unspecified: Secondary | ICD-10-CM

## 2014-10-15 DIAGNOSIS — E669 Obesity, unspecified: Secondary | ICD-10-CM | POA: Diagnosis not present

## 2014-10-15 DIAGNOSIS — I10 Essential (primary) hypertension: Secondary | ICD-10-CM | POA: Diagnosis not present

## 2014-10-15 NOTE — Progress Notes (Signed)
Cardiology Office Note   Date:  10/15/2014   ID:  Kiley, Solimine 08/05/1945, MRN [id number]  PCP:  Joycelyn Man, MD  Cardiologist:   Candee Furbish, MD      History of Present Illness: Taylor Mccall is a 69 y.o. female who presents for here for evaluation of bilateral edema of lower extremity. Does have shortness of breath from time to time.   In 2009, she had a lower extremity venous ultrasound which was normal. This was ordered because of lower extremity edema. She admits that this is been an issue for several years, fairly chronic. In the morning hours, her swelling is mildly reduced but nothing significant.  Denies any chest pain but she does have dyspnea on exertion which he has attributed to increasing age, weight.  She is trying to lose weight but this is challenging for her. She has a silver sneakers program but has not been going.  She has had bilateral knee replacements.    Past Medical History  Diagnosis Date  . Obesity   . Osteoarthritis   . Urinary incontinence   . Hypertension   . Depression   . Rheumatoid arthritis(714.0)   . Gait disturbance   . Sleep apnea     cpap  . Anxiety   . Venous insufficiency   . Breast mass, left     Past Surgical History  Procedure Laterality Date  . Bladder suspension  2009  . Total hip arthroplasty    . Vesicovaginal fistula closure w/ tah    . Toe surgery Left     Left great toe  . Replacement total knee Left   . Eye surgery    . Breast reduction surgery    . Abdominal hysterectomy    . Total knee arthroplasty Right 12/09/2013    Procedure: Right Total Knee Arthroplasty;  Surgeon: Newt Minion, MD;  Location: [facility];  Service: Orthopedics;  Laterality: Right;  . Esophagogastroduodenoscopy    . Colonoscopy    . Bladder surgery      sling     Current Outpatient Prescriptions  Medication Sig Dispense Refill  . albuterol (PROVENTIL HFA;VENTOLIN HFA) 108 (90 BASE) MCG/ACT inhaler Inhale 2 puffs  into the lungs every 6 (six) hours as needed for wheezing or shortness of breath. 1 Inhaler 2  . aspirin 81 MG tablet Take 81 mg by mouth daily.    . carboxymethylcellulose (REFRESH TEARS) 0.5 % SOLN Place 2 drops into the right eye as needed (for itchy eyes).    Marland Kitchen erythromycin (ERY-TAB) 500 MG EC tablet Take 500-1,000 mg by mouth 2 (two) times daily as needed (for dental procedures). Take 1000 mg by mouth 1 hour prior to dental appointment and take 500 mg by mouth 6 hours after dental work.    . furosemide (LASIX) 20 MG tablet Take 60 mg every other day. 90 tablet 0  . HYDROcodone-homatropine (HYCODAN) 5-1.5 MG/5ML syrup Take 5 mLs by mouth every 8 (eight) hours as needed for cough. 120 mL 0  . hydroxychloroquine (PLAQUENIL) 200 MG tablet Take 200 mg by mouth 2 (two) times daily.     . Multiple Vitamins-Minerals (PRESERVISION AREDS 2) CAPS Take 2 capsules by mouth daily.    . naproxen sodium (ANAPROX) 220 MG tablet Take 220 mg by mouth 2 (two) times daily as needed (for pain).    . NON FORMULARY 1 each by Other route See admin instructions. Use CPAP nightly.    . Omega-3 Fatty  Acids (FISH OIL) 600 MG CAPS Take 1 capsule by mouth 2 (two) times daily.    . pantoprazole (PROTONIX) 40 MG tablet Take 1 tablet (40 mg total) by mouth daily before breakfast. 30 tablet 11  . potassium chloride SA (K-DUR,KLOR-CON) 20 MEQ tablet Take 2 tablets (40 mEq total) by mouth every morning. 180 tablet 3  . sertraline (ZOLOFT) 100 MG tablet Take 1 tablet (100 mg total) by mouth every morning. 90 tablet 3  . timolol (BETIMOL) 0.5 % ophthalmic solution Place 1 drop into the left eye 2 (two) times daily.    Marland Kitchen trimethoprim (TRIMPEX) 100 MG tablet Take 100 mg by mouth 2 (two) times daily.    . vitamin B-12 (CYANOCOBALAMIN) 1000 MCG tablet Take 1,000 mcg by mouth daily.    . Vitamin D, Cholecalciferol, 1000 UNITS TABS Take 1,000 mg by mouth 1 day or 1 dose.    . vitamin E 400 UNIT capsule Take 400 Units by mouth daily.      No current facility-administered medications for this visit.    Allergies:   Penicillins    Social History:  The patient  reports that she quit smoking about 36 years ago. Her smoking use included Cigarettes. She has a 3 pack-year smoking history. She does not have any smokeless tobacco history on file. She reports that she drinks alcohol. She reports that she does not use illicit drugs.   Family History:  The patient's family history includes Asthma in her brother; Coronary artery disease in her brother and father; Diabetes in an other family member; Skin cancer in her father; Stroke in an other family member.    ROS:  Please see the history of present illness.   Otherwise, review of systems are positive for snoring, shortness of breath with activity, leg swelling, chronic, weight gain, anxiety, balance issues..   All other systems are reviewed and negative.    PHYSICAL EXAM: VS:  BP 116/76 mmHg  Pulse 69  Ht 5\' 10"  (1.778 m)  Wt 258 lb 6.4 oz (117.209 kg)  BMI 37.08 kg/m2  SpO2 97% , BMI Body mass index is 37.08 kg/(m^2). GEN: Well nourished, well developed, in no acute distress HEENT: normal Neck: no JVD, carotid bruits, or masses Cardiac: RRR; no murmurs, rubs, or gallops,no edema  Respiratory:  clear to auscultation bilaterally, normal work of breathing GI: soft, nontender, nondistended, + BS MS: no deformity or atrophy Skin: warm and dry, no rash Extremities: Soft, smooth edema, mildly pitting. Appears to have a component of mainly adipose around her ankles. Neuro:  Strength and sensation are intact Psych: euthymic mood, full affect   EKG:  EKG is ordered today. The ekg ordered today 10/15/14 shows sinus rhythm, 69 with nonspecific ST-T wave flattening, early transition of R-wave. Personally viewed   Recent Labs: 12/03/2013: ALT 12 06/17/2014: Hemoglobin 12.2; Platelets 242.0; TSH 1.82 08/27/2014: BUN 13; Creatinine, Ser 0.81; Potassium 4.0; Sodium 139    Lipid  Panel    Component Value Date/Time   CHOL 175 03/10/2013 0934   TRIG 62.0 03/10/2013 0934   HDL 57.60 03/10/2013 0934   CHOLHDL 3 03/10/2013 0934   VLDL 12.4 03/10/2013 0934   LDLCALC 105* 03/10/2013 0934   LDLDIRECT 129.7 04/11/2006 1106      Wt Readings from Last 3 Encounters:  10/15/14 258 lb 6.4 oz (117.209 kg)  09/24/14 256 lb 12.8 oz (116.484 kg)  09/20/14 258 lb (117.028 kg)      Other studies Reviewed: Additional studies/ records  that were reviewed today include: labs, office notes, ultrasound. Review of the above records demonstrates: as above   ASSESSMENT AND PLAN:  1.  Bilateral lower extremity edema-long-standing issue, several years. Agree that this may be venous insufficiency. Conservative management strategy such as leg elevation and decrease salt have been given. Compression hose have been tried. Back on 02/05/2008, she underwent ultrasound of lower extremities secondary to lower extremity edema. This ultrasound was normal. Given her mild associated dyspnea which certainly could be associated with obesity and deconditioning, I will check an echocardiogram to ensure proper structure and function. She may also have some elevation in her right-sided pressures which is contribute into her lower extremity edema as well. Encourage weight loss. Interestingly, she remembers a few years ago that an eye doctor gave her 4 pills to take for elevated pressures in her eye and her leg swelling decreased significantly. I wonder if she was given a diuretic. I agree with Lasix use. Continue to monitor her kidney function.  2. Obesity-continue to encourage weight loss. I think this will help significantly with her edema as well as her of shortness of breath. I will check echo cardiac exam is above to ensure proper structure and function.   Current medicines are reviewed at length with the patient today.  The patient does not have concerns regarding medicines.  The following changes  have been made:  no change  Labs/ tests ordered today include:   Orders Placed This Encounter  Procedures  . EKG 12-Lead  . Echocardiogram     Disposition:   I will follow-up with echocardiogram. Otherwise as needed.  Bobby Rumpf, MD  10/15/2014 12:13 PM    [facility] [place], [place], [place]  [address] Phone: [phone]; Fax: [phone]

## 2014-10-15 NOTE — Patient Instructions (Signed)
Medication Instructions:  The current medical regimen is effective;  continue present plan and medications.  Testing/Procedures: Your physician has requested that you have an echocardiogram. Echocardiography is a painless test that uses sound waves to create images of your heart. It provides your doctor with information about the size and shape of your heart and how well your heart's chambers and valves are working. This procedure takes approximately one hour. There are no restrictions for this procedure.  Follow-Up: As needed.  Thank you for choosing [facility]!!

## 2014-10-27 ENCOUNTER — Other Ambulatory Visit: Payer: Self-pay

## 2014-10-27 ENCOUNTER — Ambulatory Visit (HOSPITAL_COMMUNITY): Payer: Medicare HMO | Attending: Cardiology

## 2014-10-27 DIAGNOSIS — I5189 Other ill-defined heart diseases: Secondary | ICD-10-CM | POA: Insufficient documentation

## 2014-10-27 DIAGNOSIS — I10 Essential (primary) hypertension: Secondary | ICD-10-CM | POA: Insufficient documentation

## 2014-10-27 DIAGNOSIS — R0600 Dyspnea, unspecified: Secondary | ICD-10-CM | POA: Insufficient documentation

## 2014-10-27 DIAGNOSIS — Z6837 Body mass index (BMI) 37.0-37.9, adult: Secondary | ICD-10-CM | POA: Insufficient documentation

## 2014-10-27 DIAGNOSIS — E669 Obesity, unspecified: Secondary | ICD-10-CM | POA: Insufficient documentation

## 2014-10-27 DIAGNOSIS — I071 Rheumatic tricuspid insufficiency: Secondary | ICD-10-CM | POA: Diagnosis not present

## 2014-11-08 DIAGNOSIS — N3946 Mixed incontinence: Secondary | ICD-10-CM | POA: Diagnosis not present

## 2014-11-19 ENCOUNTER — Ambulatory Visit (INDEPENDENT_AMBULATORY_CARE_PROVIDER_SITE_OTHER): Payer: Medicare HMO | Admitting: Family Medicine

## 2014-11-19 ENCOUNTER — Telehealth: Payer: Self-pay | Admitting: Family Medicine

## 2014-11-19 VITALS — BP 118/80 | HR 66 | Temp 98.1°F | Resp 18

## 2014-11-19 DIAGNOSIS — S61211A Laceration without foreign body of left index finger without damage to nail, initial encounter: Secondary | ICD-10-CM

## 2014-11-19 DIAGNOSIS — Z23 Encounter for immunization: Secondary | ICD-10-CM | POA: Diagnosis not present

## 2014-11-19 DIAGNOSIS — S61219A Laceration without foreign body of unspecified finger without damage to nail, initial encounter: Secondary | ICD-10-CM

## 2014-11-19 NOTE — Telephone Encounter (Signed)
[facility] - Client [place] [facility] Patient Name: Taylor Mccall Gender: Female DOB: 1945/05/30 Age: 69 Y 87 M 14 D Return Phone Number: [phone] (Primary) Address: [phone] City/State/Zip: [place] [address] Client [facility] - Client Client Site [place] - Day Physician Christie Nottingham Contact Type Call [place] Name pt Caller Phone Number n Relationship To Patient Self Is this call to report lab results? No Call Type General Information Initial Comment Caller says she cut her finger about a hour ago; and it is still bleeding; and is bleeding thru the bandages. She is at an UC, but if she can be see at [facility] her dtr would driver her back.- PEr back line, they can't seen her today. Caller declined triage, will go to the uc General Information Type Other Nurse Assessment Guidelines Guideline Title Affirmed Question Affirmed Notes Nurse Date/Time (Eastern Time) Disp. Time Eilene Ghazi Time) Disposition Final User 11/19/2014 2:49:27 PM General Information Provided Yes Norton Blizzard

## 2014-11-19 NOTE — Progress Notes (Signed)
Chief Complaint:  Chief Complaint  Patient presents with  . Finger Injury    cut on index finger from cutting chicken. Happened an hour ago. Left hand. UTD on Tdap.   . Immunizations    flu shot     HPI: Taylor Mccall is a 69 y.o. female who reports to [facility] today complaining of was cutting chicken to fry chicken, she is right hand dominant, she cut her left index finger.  Sharp clean knife.   Tetanus in 2012. No diabetes. Has RA and is on plaquenil  Would like flu vaccine   Past Medical History  Diagnosis Date  . Obesity   . Osteoarthritis   . Urinary incontinence   . Hypertension   . Depression   . Rheumatoid arthritis(714.0)   . Gait disturbance   . Sleep apnea     cpap  . Anxiety   . Venous insufficiency   . Breast mass, left    Past Surgical History  Procedure Laterality Date  . Bladder suspension  2009  . Total hip arthroplasty    . Vesicovaginal fistula closure w/ tah    . Toe surgery Left     Left great toe  . Replacement total knee Left   . Eye surgery    . Breast reduction surgery    . Abdominal hysterectomy    . Total knee arthroplasty Right 12/09/2013    Procedure: Right Total Knee Arthroplasty;  Surgeon: Newt Minion, MD;  Location: [place];  Service: Orthopedics;  Laterality: Right;  . Esophagogastroduodenoscopy    . Colonoscopy    . Bladder surgery      sling   Social History   Social History  . Marital Status: Widowed    Spouse Name: N/A  . Number of Children: N/A  . Years of Education: N/A   Social History Main Topics  . Smoking status: Former Smoker -- 0.30 packs/day for 10 years    Types: Cigarettes    Quit date: 02/05/1978  . Smokeless tobacco: None     Comment: over 25 years ago...pt doesnt remember when she quit.   . Alcohol Use: Yes     Comment: occassional/social  . Drug Use: No  . Sexual Activity: Not Asked   Other Topics Concern  . None   Social History Narrative   Family History  Problem Relation Age of  Onset  . Diabetes    . Stroke    . Asthma Brother   . Coronary artery disease Father   . Coronary artery disease Brother   . Skin cancer Father    Allergies  Allergen Reactions  . Penicillins Rash   Prior to Admission medications   Medication Sig Start Date End Date Taking? Authorizing Provider  albuterol (PROVENTIL HFA;VENTOLIN HFA) 108 (90 BASE) MCG/ACT inhaler Inhale 2 puffs into the lungs every 6 (six) hours as needed for wheezing or shortness of breath. 09/24/14  Yes Dorothyann Peng, NP  aspirin 81 MG tablet Take 81 mg by mouth daily.   Yes Historical Provider, MD  carboxymethylcellulose (REFRESH TEARS) 0.5 % SOLN Place 2 drops into the right eye as needed (for itchy eyes).   Yes Historical Provider, MD  erythromycin (ERY-TAB) 500 MG EC tablet Take 500-1,000 mg by mouth 2 (two) times daily as needed (for dental procedures). Take 1000 mg by mouth 1 hour prior to dental appointment and take 500 mg by mouth 6 hours after dental work.   Yes Historical Provider, MD  furosemide (LASIX) 20 MG tablet Take 60 mg every other day. 07/28/14  Yes Dorothyann Peng, NP  HYDROcodone-homatropine (HYCODAN) 5-1.5 MG/5ML syrup Take 5 mLs by mouth every 8 (eight) hours as needed for cough. 09/24/14  Yes Dorothyann Peng, NP  hydroxychloroquine (PLAQUENIL) 200 MG tablet Take 200 mg by mouth 2 (two) times daily.    Yes Historical Provider, MD  Multiple Vitamins-Minerals (PRESERVISION AREDS 2) CAPS Take 2 capsules by mouth daily.   Yes Historical Provider, MD  naproxen sodium (ANAPROX) 220 MG tablet Take 220 mg by mouth 2 (two) times daily as needed (for pain).   Yes Historical Provider, MD  NON FORMULARY 1 each by Other route See admin instructions. Use CPAP nightly.   Yes Historical Provider, MD  Omega-3 Fatty Acids (FISH OIL) 600 MG CAPS Take 1 capsule by mouth 2 (two) times daily.   Yes Historical Provider, MD  pantoprazole (PROTONIX) 40 MG tablet Take 1 tablet (40 mg total) by mouth daily before breakfast. 09/20/14   Yes Gatha Mayer, MD  potassium chloride SA (K-DUR,KLOR-CON) 20 MEQ tablet Take 2 tablets (40 mEq total) by mouth every morning. 06/17/14  Yes Dorena Cookey, MD  sertraline (ZOLOFT) 100 MG tablet Take 1 tablet (100 mg total) by mouth every morning. 07/23/14  Yes Dorena Cookey, MD  timolol (BETIMOL) 0.5 % ophthalmic solution Place 1 drop into the left eye 2 (two) times daily.   Yes Historical Provider, MD  trimethoprim (TRIMPEX) 100 MG tablet Take 100 mg by mouth 2 (two) times daily.   Yes Historical Provider, MD  vitamin B-12 (CYANOCOBALAMIN) 1000 MCG tablet Take 1,000 mcg by mouth daily.   Yes Historical Provider, MD  Vitamin D, Cholecalciferol, 1000 UNITS TABS Take 1,000 mg by mouth 1 day or 1 dose.   Yes Historical Provider, MD  vitamin E 400 UNIT capsule Take 400 Units by mouth daily.   Yes Historical Provider, MD     ROS: The patient denies fevers, chills, night sweats, unintentional weight loss, chest pain, palpitations, wheezing, dyspnea on exertion, nausea, vomiting, abdominal pain, dysuria, hematuria, melena, numbness, weakness, or tingling.  All other systems have been reviewed and were otherwise negative with the exception of those mentioned in the HPI and as above.    PHYSICAL EXAM: Filed Vitals:   11/19/14 1508  BP: 118/80  Pulse: 66  Temp: 98.1 F (36.7 C)  Resp: 18   There is no weight on file to calculate BMI.   General: Alert, no acute distress HEENT:  Normocephalic, atraumatic, oropharynx patent. EOMI, PERRLA Cardiovascular:  Regular rate and rhythm, no rubs murmurs or gallops.  No Carotid bruits, radial pulse intact. No pedal edema.  Respiratory: Clear to auscultation bilaterally.  No wheezes, rales, or rhonchi.  No cyanosis, no use of accessory musculature Abdominal: No organomegaly, abdomen is soft and non-tender, positive bowel sounds. No masses. Skin: + 1/2 inch laceration horizonatal on medial aspect of index finger, closer to thumb about 5 mm deep, no  tenedon involvement Neurologic: Facial musculature symmetric. Psychiatric: Patient acts appropriately throughout our interaction. Lymphatic: No cervical or submandibular lymphadenopathy Musculoskeletal: Gait intact. No edema, tenderness Neurovascualrly intact  LABS: Results for orders placed or performed in visit on 88/50/27  Basic metabolic panel  Result Value Ref Range   Sodium 139 135 - 145 mEq/L   Potassium 4.0 3.5 - 5.1 mEq/L   Chloride 104 96 - 112 mEq/L   CO2 29 19 - 32 mEq/L   Glucose, Bld 91 70 -  99 mg/dL   BUN 13 6 - 23 mg/dL   Creatinine, Ser 0.81 0.40 - 1.20 mg/dL   Calcium 9.6 8.4 - 10.5 mg/dL   GFR 89.67 >60.00 mL/min     EKG/XRAY:   Primary read interpreted by Dr. Marin Comment at [facility].   ASSESSMENT/PLAN: Encounter Diagnoses  Name Primary?  . Laceration of finger of left hand, initial encounter Yes  . Flu vaccine need    69 y/o AA femalw with PMH of RA on plaquenil who is not a diabetic presents with left index finger laceration which occurred 1-2 hours agpo, utd on tetanus.  Wound care and fu as directed Precautiosnand fu as directed  Will give abx prn Flu vaccien given   Gross sideeffects, risk and benefits, and alternatives of medications d/w patient. Patient is aware that all medications have potential sideeffects and we are unable to predict every sideeffect or drug-drug interaction that may occur.  Zalen Sequeira DO  11/19/2014 3:34 PM

## 2014-11-19 NOTE — Progress Notes (Signed)
Procedure:  Consent obtained.  Local anesthesia with 2% lido.  Wound cleaned and closed with 5-0 Ethilon #2 horizontal mattress sutures.  Wound care d/w pt.  Drsg placed.  SR in 7 days.

## 2014-11-23 DIAGNOSIS — G4733 Obstructive sleep apnea (adult) (pediatric): Secondary | ICD-10-CM | POA: Diagnosis not present

## 2014-11-26 ENCOUNTER — Ambulatory Visit (INDEPENDENT_AMBULATORY_CARE_PROVIDER_SITE_OTHER): Payer: Medicare HMO | Admitting: Physician Assistant

## 2014-11-26 VITALS — BP 122/78 | HR 83 | Temp 98.6°F | Resp 16 | Ht 70.0 in | Wt 246.0 lb

## 2014-11-26 DIAGNOSIS — S61219D Laceration without foreign body of unspecified finger without damage to nail, subsequent encounter: Secondary | ICD-10-CM

## 2014-11-26 NOTE — Progress Notes (Signed)
   Subjective:    Patient ID: Taylor Mccall, female    DOB: 03-13-1945, 69 y.o.   MRN: [id number]  HPI Patient presents for suture removal status post laceration repair 1 week ago. States that she is not having any pain and denies redness, weakness, numbness, or drainage.    Review of Systems As noted above.    Objective:   Physical Exam  Constitutional: She is oriented to person, place, and time. She appears well-developed and well-nourished. No distress.  Blood pressure 122/78, pulse 83, temperature 98.6 F (37 C), temperature source Oral, resp. rate 16, height 5\' 10"  (1.778 m), weight 246 lb (111.585 kg), SpO2 97 %.   HENT:  Head: Normocephalic and atraumatic.  Right Ear: External ear normal.  Left Ear: External ear normal.  Eyes: Conjunctivae are normal. Right eye exhibits no discharge. Left eye exhibits no discharge. No scleral icterus.  Pulmonary/Chest: Effort normal.  Neurological: She is alert and oriented to person, place, and time.  Skin: Skin is warm and dry. No rash noted. She is not diaphoretic. No erythema. No pallor.  Sutures intact. Upon removal , wound re-approximated well. No erythema or purulence.   Psychiatric: She has a normal mood and affect. Her behavior is normal. Judgment and thought content normal.       Assessment & Plan:  1. Laceration of finger of left hand, subsequent encounter #2 sutures removed. Wound healed.   Alveta Heimlich PA-C  [facility] 11/26/2014 3:00 PM

## 2014-12-02 DIAGNOSIS — R69 Illness, unspecified: Secondary | ICD-10-CM | POA: Diagnosis not present

## 2014-12-07 DIAGNOSIS — G4733 Obstructive sleep apnea (adult) (pediatric): Secondary | ICD-10-CM | POA: Diagnosis not present

## 2015-01-04 DIAGNOSIS — M2550 Pain in unspecified joint: Secondary | ICD-10-CM | POA: Diagnosis not present

## 2015-01-04 DIAGNOSIS — R300 Dysuria: Secondary | ICD-10-CM | POA: Diagnosis not present

## 2015-01-04 DIAGNOSIS — Z79899 Other long term (current) drug therapy: Secondary | ICD-10-CM | POA: Diagnosis not present

## 2015-01-13 DIAGNOSIS — N3020 Other chronic cystitis without hematuria: Secondary | ICD-10-CM | POA: Diagnosis not present

## 2015-01-20 DIAGNOSIS — R600 Localized edema: Secondary | ICD-10-CM | POA: Diagnosis not present

## 2015-01-20 DIAGNOSIS — Z79899 Other long term (current) drug therapy: Secondary | ICD-10-CM | POA: Diagnosis not present

## 2015-01-20 DIAGNOSIS — M79641 Pain in right hand: Secondary | ICD-10-CM | POA: Diagnosis not present

## 2015-01-20 DIAGNOSIS — M3500 Sicca syndrome, unspecified: Secondary | ICD-10-CM | POA: Diagnosis not present

## 2015-02-01 DIAGNOSIS — R300 Dysuria: Secondary | ICD-10-CM | POA: Diagnosis not present

## 2015-02-01 DIAGNOSIS — Z79899 Other long term (current) drug therapy: Secondary | ICD-10-CM | POA: Diagnosis not present

## 2015-02-28 DIAGNOSIS — Z1231 Encounter for screening mammogram for malignant neoplasm of breast: Secondary | ICD-10-CM | POA: Diagnosis not present

## 2015-02-28 LAB — HM MAMMOGRAPHY

## 2015-03-03 ENCOUNTER — Encounter: Payer: Self-pay | Admitting: Family Medicine

## 2015-03-10 DIAGNOSIS — H353222 Exudative age-related macular degeneration, left eye, with inactive choroidal neovascularization: Secondary | ICD-10-CM | POA: Diagnosis not present

## 2015-03-10 DIAGNOSIS — H353132 Nonexudative age-related macular degeneration, bilateral, intermediate dry stage: Secondary | ICD-10-CM | POA: Diagnosis not present

## 2015-03-10 DIAGNOSIS — H401132 Primary open-angle glaucoma, bilateral, moderate stage: Secondary | ICD-10-CM | POA: Diagnosis not present

## 2015-03-10 DIAGNOSIS — Z79899 Other long term (current) drug therapy: Secondary | ICD-10-CM | POA: Diagnosis not present

## 2015-04-15 DIAGNOSIS — Z79899 Other long term (current) drug therapy: Secondary | ICD-10-CM | POA: Diagnosis not present

## 2015-04-15 DIAGNOSIS — R300 Dysuria: Secondary | ICD-10-CM | POA: Diagnosis not present

## 2015-04-20 DIAGNOSIS — Z09 Encounter for follow-up examination after completed treatment for conditions other than malignant neoplasm: Secondary | ICD-10-CM | POA: Diagnosis not present

## 2015-04-20 DIAGNOSIS — R600 Localized edema: Secondary | ICD-10-CM | POA: Diagnosis not present

## 2015-04-20 DIAGNOSIS — M79641 Pain in right hand: Secondary | ICD-10-CM | POA: Diagnosis not present

## 2015-04-20 DIAGNOSIS — M359 Systemic involvement of connective tissue, unspecified: Secondary | ICD-10-CM | POA: Diagnosis not present

## 2015-04-27 DIAGNOSIS — Z0000 Encounter for general adult medical examination without abnormal findings: Secondary | ICD-10-CM | POA: Diagnosis not present

## 2015-04-27 DIAGNOSIS — N3020 Other chronic cystitis without hematuria: Secondary | ICD-10-CM | POA: Diagnosis not present

## 2015-04-27 DIAGNOSIS — N3946 Mixed incontinence: Secondary | ICD-10-CM | POA: Diagnosis not present

## 2015-05-02 DIAGNOSIS — R300 Dysuria: Secondary | ICD-10-CM | POA: Diagnosis not present

## 2015-05-02 DIAGNOSIS — Z79899 Other long term (current) drug therapy: Secondary | ICD-10-CM | POA: Diagnosis not present

## 2015-05-13 DIAGNOSIS — H40023 Open angle with borderline findings, high risk, bilateral: Secondary | ICD-10-CM | POA: Diagnosis not present

## 2015-05-13 DIAGNOSIS — H1013 Acute atopic conjunctivitis, bilateral: Secondary | ICD-10-CM | POA: Diagnosis not present

## 2015-05-13 DIAGNOSIS — M069 Rheumatoid arthritis, unspecified: Secondary | ICD-10-CM | POA: Diagnosis not present

## 2015-05-13 DIAGNOSIS — Z79899 Other long term (current) drug therapy: Secondary | ICD-10-CM | POA: Diagnosis not present

## 2015-05-13 DIAGNOSIS — H04123 Dry eye syndrome of bilateral lacrimal glands: Secondary | ICD-10-CM | POA: Diagnosis not present

## 2015-05-16 DIAGNOSIS — Z79899 Other long term (current) drug therapy: Secondary | ICD-10-CM | POA: Diagnosis not present

## 2015-07-07 DIAGNOSIS — R69 Illness, unspecified: Secondary | ICD-10-CM | POA: Diagnosis not present

## 2015-07-27 DIAGNOSIS — Z79899 Other long term (current) drug therapy: Secondary | ICD-10-CM | POA: Diagnosis not present

## 2015-07-29 DIAGNOSIS — M7071 Other bursitis of hip, right hip: Secondary | ICD-10-CM | POA: Diagnosis not present

## 2015-08-15 ENCOUNTER — Encounter: Payer: Self-pay | Admitting: Pulmonary Disease

## 2015-08-15 ENCOUNTER — Ambulatory Visit (INDEPENDENT_AMBULATORY_CARE_PROVIDER_SITE_OTHER): Payer: Medicare HMO | Admitting: Pulmonary Disease

## 2015-08-15 VITALS — BP 126/84 | HR 62 | Ht 70.0 in | Wt 266.0 lb

## 2015-08-15 DIAGNOSIS — E669 Obesity, unspecified: Secondary | ICD-10-CM

## 2015-08-15 DIAGNOSIS — G4733 Obstructive sleep apnea (adult) (pediatric): Secondary | ICD-10-CM

## 2015-08-15 NOTE — Assessment & Plan Note (Signed)
Weight reduction 

## 2015-08-15 NOTE — Patient Instructions (Signed)
  It was a pleasure taking care of you today!  Continue using your CPAP machine.   Please make sure you use your CPAP device everytime you sleep.  We will monitor the usage of your machine per your insurance requirement.  Your insurance company may take the machine from you if you are not using it regularly.   Please clean the mask, tubings, filter, water reservoir with soapy water every week.  Please use distilled water for the water reservoir.   Please call the office or your machine provider (DME company) if you are having issues with the device.   Return to clinic in 1 year    

## 2015-08-15 NOTE — Progress Notes (Signed)
Subjective:    Patient ID: Taylor Mccall, female    DOB: 20-Dec-1945, 70 y.o.   MRN: [id number]  HPI Patient returns to the office as follow-up on her sleep apnea. Last seen was in June 2016 by Dr. Gwenette Greet.   ROV 08/15/15  since last seen, she states she's been doing fair. Uses CPAP. Had issues with leak and mask before. Better now. Uses cpap and feels benefit.  Recently started on MTX for arthritis. Has not been admitted nor has been on abx recently.    Review of Systems  Constitutional: Positive for fatigue.  HENT: Negative.   Eyes: Negative.   Respiratory: Negative.   Cardiovascular: Negative.   Gastrointestinal: Negative.   Endocrine: Negative.   Genitourinary: Negative.   Musculoskeletal: Negative.   Skin: Negative.   Allergic/Immunologic: Negative.   Neurological: Negative.   Hematological: Negative.   Psychiatric/Behavioral: Negative.   All other systems reviewed and are negative.      Objective:   Physical Exam  Vitals:  Filed Vitals:   08/15/15 1126  BP: 126/84  Pulse: 62  Height: 5\' 10"  (1.778 m)  Weight: 266 lb (120.657 kg)  SpO2: 98%    Constitutional/General:  Pleasant, well-nourished, well-developed, not in any distress,  Comfortably seating.  Well kempt  Body mass index is 38.17 kg/(m^2). Wt Readings from Last 3 Encounters:  08/15/15 266 lb (120.657 kg)  11/26/14 246 lb (111.585 kg)  10/15/14 258 lb 6.4 oz (117.209 kg)    Neck circumference:   HEENT: Pupils equal and reactive to light and accommodation. Anicteric sclerae. Normal nasal mucosa.   No oral  lesions,  mouth clear,  oropharynx clear, no postnasal drip. (-) Oral thrush. No dental caries.  Airway - Mallampati class III  Neck: No masses. Midline trachea. No JVD, (-) LAD. (-) bruits appreciated.  Respiratory/Chest: Grossly normal chest. (-) deformity. (-) Accessory muscle use.  Symmetric expansion. (-) Tenderness on palpation.  Resonant on percussion.  Diminished BS on both  lower lung zones. (-) wheezing, crackles, rhonchi (-) egophony  Cardiovascular: Regular rate and  rhythm, heart sounds normal, no murmur or gallops, no peripheral edema  Gastrointestinal:  Normal bowel sounds. Soft, non-tender. No hepatosplenomegaly.  (-) masses.   Musculoskeletal:  Normal muscle tone. Normal gait.   Extremities: Grossly normal. (-) clubbing, cyanosis.  (-) edema  Skin: (-) rash,lesions seen.   Neurological/Psychiatric : alert, oriented to time, place, person. Normal mood and affect            Assessment & Plan:  Obstructive sleep apnea NPSG 2009:  AHI 21/hr.  Auto 2014:  Optimal pressure 15cm Pt uses cpap, feels better using it. More energy. Less sleepiness. Denies any issues with mask, tubings, cpap.   We extensively discussed the importance of treating OSA and the need to use PAP therapy.   Continue with CPAP 15 cm water.    Patient was instructed to have mask, tubings, filter, reservoir cleaned at least once a week with soapy water.  Patient was instructed to call the office if he/she is having issues with the PAP device.    I advised patient to obtain sufficient amount of sleep --  7 to 8 hours at least in a 24 hr period.  Patient was advised to follow good sleep hygiene.  Patient was advised NOT to engage in activities requiring concentration and/or vigilance if he/she is and  sleepy.  Patient is NOT to drive if he/she is sleepy.   Obesity Weight reduction.  RTC in 1 year.   Monica Becton, MD 08/15/2015, 11:41 AM [facility] Pager [phone] After 3 pm or if no answer, call [phone]

## 2015-08-15 NOTE — Assessment & Plan Note (Signed)
NPSG 2009:  AHI 21/hr.  Auto 2014:  Optimal pressure 15cm Pt uses cpap, feels better using it. More energy. Less sleepiness. Denies any issues with mask, tubings, cpap.   We extensively discussed the importance of treating OSA and the need to use PAP therapy.   Continue with CPAP 15 cm water.    Patient was instructed to have mask, tubings, filter, reservoir cleaned at least once a week with soapy water.  Patient was instructed to call the office if he/she is having issues with the PAP device.    I advised patient to obtain sufficient amount of sleep --  7 to 8 hours at least in a 24 hr period.  Patient was advised to follow good sleep hygiene.  Patient was advised NOT to engage in activities requiring concentration and/or vigilance if he/she is and  sleepy.  Patient is NOT to drive if he/she is sleepy.

## 2015-08-16 ENCOUNTER — Encounter: Payer: Self-pay | Admitting: Internal Medicine

## 2015-08-18 ENCOUNTER — Encounter: Payer: Self-pay | Admitting: Pulmonary Disease

## 2015-08-23 DIAGNOSIS — G4733 Obstructive sleep apnea (adult) (pediatric): Secondary | ICD-10-CM | POA: Diagnosis not present

## 2015-08-30 DIAGNOSIS — G4733 Obstructive sleep apnea (adult) (pediatric): Secondary | ICD-10-CM | POA: Diagnosis not present

## 2015-09-02 ENCOUNTER — Emergency Department (HOSPITAL_COMMUNITY): Payer: Medicare HMO

## 2015-09-02 ENCOUNTER — Encounter (HOSPITAL_COMMUNITY): Payer: Self-pay | Admitting: Emergency Medicine

## 2015-09-02 ENCOUNTER — Emergency Department (HOSPITAL_COMMUNITY)
Admission: EM | Admit: 2015-09-02 | Discharge: 2015-09-02 | Disposition: A | Discharge status: Home / Self Care | Payer: Medicare HMO | Attending: Emergency Medicine | Admitting: Emergency Medicine

## 2015-09-02 DIAGNOSIS — Z96653 Presence of artificial knee joint, bilateral: Secondary | ICD-10-CM | POA: Insufficient documentation

## 2015-09-02 DIAGNOSIS — Z79899 Other long term (current) drug therapy: Secondary | ICD-10-CM | POA: Diagnosis not present

## 2015-09-02 DIAGNOSIS — Z96649 Presence of unspecified artificial hip joint: Secondary | ICD-10-CM | POA: Diagnosis not present

## 2015-09-02 DIAGNOSIS — I10 Essential (primary) hypertension: Secondary | ICD-10-CM | POA: Diagnosis not present

## 2015-09-02 DIAGNOSIS — Z7982 Long term (current) use of aspirin: Secondary | ICD-10-CM | POA: Insufficient documentation

## 2015-09-02 DIAGNOSIS — M25561 Pain in right knee: Secondary | ICD-10-CM | POA: Diagnosis not present

## 2015-09-02 NOTE — ED Triage Notes (Signed)
Pt st's she started having right knee pain this am after hearing a pop in her knee.  No known injury.  Pt had right knee replacement in 2015

## 2015-09-02 NOTE — ED Provider Notes (Signed)
[place] DEPT Provider Note   CSN: [id number] Arrival date & time: 09/02/15  1514 By signing my name below, I, Taylor Mccall, attest that this documentation has been prepared under the direction and in the presence of [employer], PA-C. Electronically Signed: Judithann Sauger, ED Scribe. 09/02/15. 4:43 PM.   History   Chief Complaint Chief Complaint  Patient presents with  . Knee Pain    HPI Comments: Taylor Mccall is a 70 y.o. female with a hx of Osteoarthritis, hypertension, rheumatoid arthritis, gait disturbance, and bilateral total knee arthroplasty who presents to the Emergency Department complaining of sudden onset moderate right knee pain this am. She notes that she only feels the pain when she bends the knee and it is as if her knee is "sticking". She states that the pain began this am while she ambulating with a cane and she heard a "pop". No falls at that time. No alleviating factors noted. Pt has not tried any medications PTA. She denies any recent increase in exercise or activities. She denies a hx of heart issues or anti-coagulants use. She also denies any numbness, tingling, weakness, trauma, acute knee swelling, rash, or any open wounds.   Orthopedist: Dr. Sharol Given   The history is provided by the patient. No language interpreter was used.  Extremity Pain  Pertinent negatives include no numbness.    Past Medical History:  Diagnosis Date  . Anxiety   . Breast mass, left   . Depression   . Gait disturbance   . Hypertension   . Obesity   . Osteoarthritis   . Rheumatoid arthritis(714.0)   . Sleep apnea    cpap  . Urinary incontinence   . Venous insufficiency     Patient Active Problem List   Diagnosis Date Noted  . Total knee replacement status 12/09/2013  . Breast mass, left 01/18/2011  . Obesity 12/08/2009  . Venous (peripheral) insufficiency 07/28/2009  . GALACTORRHEA 07/28/2009  . PAIN IN JOINT, ANKLE AND FOOT 01/12/2008  . Obstructive  sleep apnea 08/29/2007  . GAIT DISTURBANCE 08/14/2007  . DEPRESSION 08/26/2006  . Essential hypertension 08/26/2006  . Osteoarthritis 08/26/2006  . Urinary incontinence 08/26/2006    Past Surgical History:  Procedure Laterality Date  . ABDOMINAL HYSTERECTOMY    . BLADDER SURGERY     sling  . BLADDER SUSPENSION  2009  . BREAST REDUCTION SURGERY    . COLONOSCOPY    . ESOPHAGOGASTRODUODENOSCOPY    . EYE SURGERY    . REPLACEMENT TOTAL KNEE Left   . TOE SURGERY Left    Left great toe  . TOTAL HIP ARTHROPLASTY    . TOTAL KNEE ARTHROPLASTY Right 12/09/2013   Procedure: Right Total Knee Arthroplasty;  Surgeon: Newt Minion, MD;  Location: [place];  Service: Orthopedics;  Laterality: Right;  Marland Kitchen VESICOVAGINAL FISTULA CLOSURE W/ TAH      OB History    No data available       Home Medications    Prior to Admission medications   Medication Sig Start Date End Date Taking? Authorizing Provider  albuterol (PROVENTIL HFA;VENTOLIN HFA) 108 (90 BASE) MCG/ACT inhaler Inhale 2 puffs into the lungs every 6 (six) hours as needed for wheezing or shortness of breath. 09/24/14   Dorothyann Peng, NP  aspirin 81 MG tablet Take 81 mg by mouth daily.    Historical Provider, MD  carboxymethylcellulose (REFRESH TEARS) 0.5 % SOLN Place 2 drops into the right eye as needed (for itchy eyes).  Historical Provider, MD  erythromycin (ERY-TAB) 500 MG EC tablet Take 500-1,000 mg by mouth 2 (two) times daily as needed (for dental procedures). Take 1000 mg by mouth 1 hour prior to dental appointment and take 500 mg by mouth 6 hours after dental work.    Historical Provider, MD  furosemide (LASIX) 20 MG tablet Take 60 mg every other day. 07/28/14   Dorothyann Peng, NP  hydroxychloroquine (PLAQUENIL) 200 MG tablet Take 200 mg by mouth 2 (two) times daily.     Historical Provider, MD  methotrexate 2.5 MG tablet Take 15 mg by mouth once a week.    Historical Provider, MD  Multiple Vitamins-Minerals (PRESERVISION AREDS 2)  CAPS Take 2 capsules by mouth daily.    Historical Provider, MD  naproxen sodium (ANAPROX) 220 MG tablet Take 220 mg by mouth 2 (two) times daily as needed (for pain).    Historical Provider, MD  NON FORMULARY 1 each by Other route See admin instructions. Use CPAP nightly.    Historical Provider, MD  Omega-3 Fatty Acids (FISH OIL) 600 MG CAPS Take 1 capsule by mouth 2 (two) times daily.    Historical Provider, MD  pantoprazole (PROTONIX) 40 MG tablet Take 1 tablet (40 mg total) by mouth daily before breakfast. 09/20/14   Gatha Mayer, MD  potassium chloride SA (K-DUR,KLOR-CON) 20 MEQ tablet Take 2 tablets (40 mEq total) by mouth every morning. 06/17/14   Dorena Cookey, MD  sertraline (ZOLOFT) 100 MG tablet Take 1 tablet (100 mg total) by mouth every morning. 07/23/14   Dorena Cookey, MD  timolol (BETIMOL) 0.5 % ophthalmic solution Place 1 drop into the left eye 2 (two) times daily.    Historical Provider, MD  trimethoprim (TRIMPEX) 100 MG tablet Take 100 mg by mouth 2 (two) times daily.    Historical Provider, MD  vitamin B-12 (CYANOCOBALAMIN) 1000 MCG tablet Take 1,000 mcg by mouth daily.    Historical Provider, MD  Vitamin D, Cholecalciferol, 1000 UNITS TABS Take 1,000 mg by mouth 1 day or 1 dose.    Historical Provider, MD  vitamin E 400 UNIT capsule Take 400 Units by mouth daily.    Historical Provider, MD    Family History Family History  Problem Relation Age of Onset  . Diabetes    . Stroke    . Asthma Brother   . Coronary artery disease Father   . Skin cancer Father   . Coronary artery disease Brother     Social History Social History  Substance Use Topics  . Smoking status: Former Smoker    Packs/day: 0.30    Years: 10.00    Types: Cigarettes    Quit date: 02/05/1978  . Smokeless tobacco: Never Used     Comment: over 25 years ago...pt doesnt remember when she quit.   . Alcohol use Yes     Comment: occassional/social     Allergies   Penicillins   Review of  Systems Review of Systems  Musculoskeletal: Positive for arthralgias. Negative for joint swelling.  Skin: Negative for rash and wound.  Neurological: Negative for weakness and numbness.     Physical Exam Updated Vital Signs BP 124/99 (BP Location: Left Arm)   Pulse 64   Temp 97.9 F (36.6 C) (Oral)   Resp 20   SpO2 100%   Physical Exam  Constitutional: She appears well-developed and well-nourished. No distress.  HENT:  Head: Normocephalic and atraumatic.  Eyes: Conjunctivae are normal.  Pulmonary/Chest: Effort normal.  No respiratory distress.  Musculoskeletal:  Examination of right knee revealed no deformities, ecchymosis, mild edema noted to medial aspect of knee, no effusion, well-healed old scar from knee arthroplasty, good AROM, TTP to medial aspect of knee, TTP 2 pens anserine bursa, no TTP to lateral knee or of knee joint, strength 5/5 of bilateral lower extremities including plantar flexion and extension, patient neurovascular intact distally bilaterally  1+ pitting edema bilateral lower extremities  Neurological: She is alert. Coordination normal.  Skin: Skin is warm and dry. She is not diaphoretic.  Psychiatric: She has a normal mood and affect. Her behavior is normal.  Nursing note and vitals reviewed.    ED Treatments / Results  DIAGNOSTIC STUDIES: Oxygen Saturation is 100% on RA, normal by my interpretation.    COORDINATION OF CARE: 4:33 PM- Pt advised of plan for treatment and pt agrees. Pt will receive x-ray for further evaluation.    Labs (all labs ordered are listed, but only abnormal results are displayed) Labs Reviewed - No data to display  EKG  EKG Interpretation None       Radiology Dg Knee Complete 4 Views Right  Result Date: 09/02/2015 CLINICAL DATA:  Heard knee pop while walking. Anterior and medial knee pain. EXAM: RIGHT KNEE - COMPLETE 4+ VIEW COMPARISON:  None. FINDINGS: Changes of right knee replacement. No acute bony abnormality.  Specifically, no fracture, subluxation, or dislocation. Soft tissues are intact. No joint effusion. IMPRESSION: Right knee replacement. No complicating feature. No acute findings. Electronically Signed   By: Rolm Baptise M.D.   On: 09/02/2015 17:47   Procedures Procedures (including critical care time)  Medications Ordered in ED Medications - No data to display   Initial Impression / Assessment and Plan / ED Course  Jackson Latino, PA-C has reviewed the triage vital signs and the nursing notes.  Pertinent labs & imaging results that were available during my care of the patient were reviewed by me and considered in my medical decision making (see chart for details).  Clinical Course  Value Comment By Time  DG Knee Complete 4 Views Right (Reviewed) Taylor Mccall 07/28 1704     Final Clinical Impressions(s) / ED Diagnoses   Final diagnoses:  Knee pain, right    Patient with right knee pain. Presentation concerning for sprain or strain. No trauma. X-rays reviewed by me revealed no bony abnormality, dislocation, or fracture. Right knee replacement viewed with no complicating features. Patient is neurovascularly intact distally and able to move knee and walk although states it is painful. Patient given brace in the ED. Discussed RICE and pain medication. Instructed patient to follow up with their primary care provider and Dr. Sharol Given her orthopedist, within 2-3 days to have knee reevaluated. Discussed strict return precautions to the ED. patient appears reliable and expressed understanding to the discharge instructions.    I personally performed the services described in this documentation, which was scribed in my presence. The recorded information has been reviewed and is accurate.   New Prescriptions New Prescriptions   No medications on file      Kalman Drape, [place] 09/02/15 1836    Leonard Schwartz, MD 09/08/15 [phone]

## 2015-09-02 NOTE — Discharge Instructions (Signed)
Your xray was negative for any acute abnormalities. Use ice as often as you can for the next 2 days, 20 minutes on and 20 minutes off. Be sure to keep a thin cloth between your skin and the ice. Follow up with your primary care provider and Dr. Sharol Given on Monday to be seen as early as Monday. Return to the emergency department if you experience numbness/tingling, weakness, swelling of your knee, fever or any other concerning symptoms.

## 2015-09-08 DIAGNOSIS — H353222 Exudative age-related macular degeneration, left eye, with inactive choroidal neovascularization: Secondary | ICD-10-CM | POA: Diagnosis not present

## 2015-09-08 DIAGNOSIS — H40023 Open angle with borderline findings, high risk, bilateral: Secondary | ICD-10-CM | POA: Diagnosis not present

## 2015-09-08 DIAGNOSIS — H353112 Nonexudative age-related macular degeneration, right eye, intermediate dry stage: Secondary | ICD-10-CM | POA: Diagnosis not present

## 2015-09-08 DIAGNOSIS — H04123 Dry eye syndrome of bilateral lacrimal glands: Secondary | ICD-10-CM | POA: Diagnosis not present

## 2015-09-29 DIAGNOSIS — R69 Illness, unspecified: Secondary | ICD-10-CM | POA: Diagnosis not present

## 2015-10-19 ENCOUNTER — Other Ambulatory Visit (INDEPENDENT_AMBULATORY_CARE_PROVIDER_SITE_OTHER): Payer: Medicare HMO

## 2015-10-19 DIAGNOSIS — Z0000 Encounter for general adult medical examination without abnormal findings: Secondary | ICD-10-CM

## 2015-10-19 LAB — POC URINALSYSI DIPSTICK (AUTOMATED)
Bilirubin, UA: NEGATIVE
Blood, UA: NEGATIVE
Glucose, UA: NEGATIVE
KETONES UA: NEGATIVE
LEUKOCYTES UA: NEGATIVE
Nitrite, UA: NEGATIVE
PROTEIN UA: NEGATIVE
Spec Grav, UA: 1.025
UROBILINOGEN UA: 0.2
pH, UA: 5.5

## 2015-10-19 LAB — LIPID PANEL
CHOL/HDL RATIO: 3
Cholesterol: 171 mg/dL (ref 0–200)
HDL: 64.5 mg/dL (ref >39.00)
LDL Cholesterol: 94 mg/dL (ref 0–99)
NONHDL: 106.35
TRIGLYCERIDES: 64 mg/dL (ref 0.0–149.0)
VLDL: 12.8 mg/dL (ref 0.0–40.0)

## 2015-10-19 LAB — CBC WITH DIFFERENTIAL/PLATELET
BASOS PCT: 1 % (ref 0.0–3.0)
Basophils Absolute: 0 10*3/uL (ref 0.0–0.1)
EOS ABS: 0.3 10*3/uL (ref 0.0–0.7)
Eosinophils Relative: 6.5 % — ABNORMAL HIGH (ref 0.0–5.0)
HEMATOCRIT: 36.8 % (ref 36.0–46.0)
HEMOGLOBIN: 12.3 g/dL (ref 12.0–15.0)
LYMPHS PCT: 23.9 % (ref 12.0–46.0)
Lymphs Abs: 1 10*3/uL (ref 0.7–4.0)
MCHC: 33.5 g/dL (ref 30.0–36.0)
MCV: 91.2 fl (ref 78.0–100.0)
Monocytes Absolute: 0.3 10*3/uL (ref 0.1–1.0)
Monocytes Relative: 6.9 % (ref 3.0–12.0)
Neutro Abs: 2.7 10*3/uL (ref 1.4–7.7)
Neutrophils Relative %: 61.7 % (ref 43.0–77.0)
Platelets: 251 10*3/uL (ref 150.0–400.0)
RBC: 4.04 Mil/uL (ref 3.87–5.11)
RDW: 13.6 % (ref 11.5–15.5)
WBC: 4.3 10*3/uL (ref 4.0–10.5)

## 2015-10-19 LAB — HEPATIC FUNCTION PANEL
ALK PHOS: 98 U/L (ref 39–117)
ALT: 12 U/L (ref 0–35)
AST: 14 U/L (ref 0–37)
Albumin: 3.8 g/dL (ref 3.5–5.2)
BILIRUBIN DIRECT: 0.2 mg/dL (ref 0.0–0.3)
TOTAL PROTEIN: 6.8 g/dL (ref 6.0–8.3)
Total Bilirubin: 0.8 mg/dL (ref 0.2–1.2)

## 2015-10-19 LAB — BASIC METABOLIC PANEL
BUN: 14 mg/dL (ref 6–23)
CHLORIDE: 108 meq/L (ref 96–112)
CO2: 29 mEq/L (ref 19–32)
CREATININE: 0.72 mg/dL (ref 0.40–1.20)
Calcium: 9 mg/dL (ref 8.4–10.5)
GFR: 102.39 mL/min (ref >60.00)
Glucose, Bld: 76 mg/dL (ref 70–99)
POTASSIUM: 3.6 meq/L (ref 3.5–5.1)
Sodium: 143 mEq/L (ref 135–145)

## 2015-10-19 LAB — TSH: TSH: 2.16 u[IU]/mL (ref 0.35–4.50)

## 2015-10-26 ENCOUNTER — Encounter: Payer: Self-pay | Admitting: Family Medicine

## 2015-10-26 ENCOUNTER — Ambulatory Visit (INDEPENDENT_AMBULATORY_CARE_PROVIDER_SITE_OTHER): Payer: Medicare HMO | Admitting: Family Medicine

## 2015-10-26 VITALS — BP 158/86 | HR 81 | Temp 97.8°F | Ht 68.25 in | Wt 271.9 lb

## 2015-10-26 DIAGNOSIS — Z96651 Presence of right artificial knee joint: Secondary | ICD-10-CM

## 2015-10-26 DIAGNOSIS — F329 Major depressive disorder, single episode, unspecified: Secondary | ICD-10-CM

## 2015-10-26 DIAGNOSIS — M25571 Pain in right ankle and joints of right foot: Secondary | ICD-10-CM

## 2015-10-26 DIAGNOSIS — R269 Unspecified abnormalities of gait and mobility: Secondary | ICD-10-CM

## 2015-10-26 DIAGNOSIS — F32A Depression, unspecified: Secondary | ICD-10-CM

## 2015-10-26 DIAGNOSIS — R600 Localized edema: Secondary | ICD-10-CM | POA: Insufficient documentation

## 2015-10-26 DIAGNOSIS — M1990 Unspecified osteoarthritis, unspecified site: Secondary | ICD-10-CM

## 2015-10-26 DIAGNOSIS — Z0000 Encounter for general adult medical examination without abnormal findings: Secondary | ICD-10-CM | POA: Diagnosis not present

## 2015-10-26 DIAGNOSIS — I872 Venous insufficiency (chronic) (peripheral): Secondary | ICD-10-CM

## 2015-10-26 DIAGNOSIS — I10 Essential (primary) hypertension: Secondary | ICD-10-CM | POA: Diagnosis not present

## 2015-10-26 DIAGNOSIS — E669 Obesity, unspecified: Secondary | ICD-10-CM

## 2015-10-26 DIAGNOSIS — Z23 Encounter for immunization: Secondary | ICD-10-CM | POA: Diagnosis not present

## 2015-10-26 MED ORDER — SERTRALINE HCL 100 MG PO TABS: 100.0000 mg | ORAL_TABLET | Freq: Every morning | ORAL | 3 refills | Status: DC

## 2015-10-26 MED ORDER — POTASSIUM CHLORIDE CRYS ER 20 MEQ PO TBCR: 40.0000 meq | EXTENDED_RELEASE_TABLET | Freq: Every morning | ORAL | 3 refills | Status: DC

## 2015-10-26 MED ORDER — FUROSEMIDE 20 MG PO TABS: ORAL_TABLET | ORAL | 4 refills | Status: DC

## 2015-10-26 NOTE — Patient Instructions (Signed)
Continue current medications  Purchase a Omron pump up digital blood pressure cuff. [employer]  Check your blood pressure right arm sitting position daily for 2 weeks every morning. Return in 2 weeks for follow-up. Bring a record of all your blood pressure readings and the device  Carbohydrate free diet  Physical therapy consult to help you get moving

## 2015-10-26 NOTE — Progress Notes (Signed)
Pre visit review using our clinic review tool, if applicable. No additional management support is needed unless otherwise documented below in the visit note. 

## 2015-10-26 NOTE — Progress Notes (Signed)
Taylor Mccall is a 70 year old married female nonsmoker who comes in today for general physical examination because of a history of severe rheumatoid arthritis, obesity weight 266 pounds, venous insufficiency, history of depression, and numerous other medical problems related to obesity  She gets routine eye care, dental care, mammogram January 2017 normal, colonoscopy 2010 normal  Vaccinations up-to-date except she's due to Pneumovax 23, flu shot and shingles vaccine. Pneumovax and flu shot given today she'll call insurance company and find out where she gets it done the shingles vaccine.  Cognitive function normal she does not exercise because of her degenerative joint disease and obesity, home health safety reviewed no issues identified, no guns in the house, she does have a healthcare power of attorney and living well.  She sees her rheumatologist on a regular basis is on methotrexate because of the rheumatoid arthritis.  She takes Lasix 20 mg dose 2 tabs morning 1 at noon because of venous insufficiency. She's had both knees replaced and she's had also had foot surgery. She takes Zoloft 100 mg daily at bedtime because a history of mild depression to 20 mEq potassium tablets daily.  Social history she lives with her 37 year old daughter she sedentary does not walk because of pain in her knees. I suggested a physical therapy evaluation. [facility] not [facility].  Physical examination  Vital signs stable she's afebrile except for weight is 266 pounds. BP 150/86  HEENT were negative neck was supple no adenopathy thyroid normal no carotid bruits cardiopulmonary exam normal breast normal except for scars from previous breast reduction. Abdominal exam normal pelvic and rectal deferred. She's had a uterus removed for nonmalignant reasons. Extremities normal skin normal peripheral pulses normal except for scars in both knees from previous total knee replacement scars in her  feet from previous foot surgery and venous insufficiency and 1-2+ edema bilaterally  Impression #1 rheumatoid arthritis,,,,,,, continue methotrexate and followed by rheumatologist  #2 venous insufficiency,,,,,,,,,, continue Lasix  #3 obesity,,,,,,,, again stressed diet exercise and weight loss  #4 bilateral knee pain and foot pain,,,,,,,,, referred to physical therapy  #5 elevated blood pressure........... BP check daily at home follow-up in 2 weeks

## 2015-10-31 ENCOUNTER — Ambulatory Visit: Payer: Medicare HMO | Attending: Family Medicine

## 2015-10-31 DIAGNOSIS — M6281 Muscle weakness (generalized): Secondary | ICD-10-CM | POA: Insufficient documentation

## 2015-10-31 DIAGNOSIS — R293 Abnormal posture: Secondary | ICD-10-CM | POA: Insufficient documentation

## 2015-10-31 DIAGNOSIS — M25661 Stiffness of right knee, not elsewhere classified: Secondary | ICD-10-CM | POA: Insufficient documentation

## 2015-10-31 DIAGNOSIS — R2689 Other abnormalities of gait and mobility: Secondary | ICD-10-CM | POA: Insufficient documentation

## 2015-10-31 DIAGNOSIS — M25662 Stiffness of left knee, not elsewhere classified: Secondary | ICD-10-CM | POA: Insufficient documentation

## 2015-11-02 ENCOUNTER — Ambulatory Visit: Payer: Medicare HMO | Admitting: Physical Therapy

## 2015-11-02 DIAGNOSIS — R2689 Other abnormalities of gait and mobility: Secondary | ICD-10-CM | POA: Diagnosis not present

## 2015-11-02 DIAGNOSIS — R293 Abnormal posture: Secondary | ICD-10-CM | POA: Diagnosis not present

## 2015-11-02 DIAGNOSIS — M25661 Stiffness of right knee, not elsewhere classified: Secondary | ICD-10-CM | POA: Diagnosis not present

## 2015-11-02 DIAGNOSIS — M25662 Stiffness of left knee, not elsewhere classified: Secondary | ICD-10-CM | POA: Diagnosis not present

## 2015-11-02 DIAGNOSIS — Z79899 Other long term (current) drug therapy: Secondary | ICD-10-CM | POA: Diagnosis not present

## 2015-11-02 DIAGNOSIS — M6281 Muscle weakness (generalized): Secondary | ICD-10-CM | POA: Diagnosis not present

## 2015-11-02 NOTE — Patient Instructions (Signed)
Abduction: Clam (Eccentric) - Side-Lying    Lie on side with knees bent. Lift top knee, keeping feet together. Keep trunk steady. Slowly lower for 3-5 seconds. __10-20_ reps per set, _1-2__ sets per day, __5-7_ days per week.   http://ecce.exer.us/65   Copyright  VHI. All rights reserved.  Lower Trunk Rotation Stretch    Keeping back flat and feet together, rotate knees to left side. Hold __10-15__ seconds. Repeat ___10_ times per set. Do __1__ sets per session. Do __2__ sessions per day.  http://orth.exer.us/123   Copyright  VHI. All rights reserved.

## 2015-11-03 NOTE — Therapy (Signed)
[place], [place], [address] Phone: [phone]   Fax:  [phone]  Physical Therapy Evaluation  Patient Details  Name: Taylor Mccall MRN: [id number] Date of Birth: April 01, 1945 Referring Provider: Dr. Stevie Kern  Encounter Date: 11/02/2015      PT End of Session - 11/03/15 1001    Visit Number 1   Number of Visits 12   Date for PT Re-Evaluation 12/29/15   PT Start Time 1430   PT Stop Time 1530   PT Time Calculation (min) 60 min   Activity Tolerance Patient tolerated treatment well   Behavior During Therapy [facility] for tasks assessed/performed      Past Medical History:  Diagnosis Date  . Anxiety   . Breast mass, left   . Depression   . Gait disturbance   . Hypertension   . Obesity   . Osteoarthritis   . Rheumatoid arthritis(714.0)   . Sleep apnea    cpap  . Urinary incontinence   . Venous insufficiency     Past Surgical History:  Procedure Laterality Date  . ABDOMINAL HYSTERECTOMY    . BLADDER SURGERY     sling  . BLADDER SUSPENSION  2009  . BREAST REDUCTION SURGERY    . COLONOSCOPY    . ESOPHAGOGASTRODUODENOSCOPY    . EYE SURGERY    . REPLACEMENT TOTAL KNEE Left   . TOE SURGERY Left    Left great toe  . TOTAL HIP ARTHROPLASTY    . TOTAL KNEE ARTHROPLASTY Right 12/09/2013   Procedure: Right Total Knee Arthroplasty;  Surgeon: Newt Minion, MD;  Location: [place];  Service: Orthopedics;  Laterality: Right;  Marland Kitchen VESICOVAGINAL FISTULA CLOSURE W/ TAH      There were no vitals filed for this visit.       Subjective Assessment - 11/02/15 1432    Subjective Pt referred to address her LE pain, swelling and abnormal gait.  She has had multiple orthopedic surgeries on her LEs.  She has trouble getting up from a low chair, car.  She has had a decline in mobility over the past 5 yrs.     Pertinent History bilat TKR, L THR, Rt. ORIF, RA, OA   Limitations Walking;Sitting;House hold  activities;Standing   How long can you sit comfortably? 1 hour depends on the chair   How long can you stand comfortably? 20 min    How long can you walk comfortably? 10 min    Diagnostic tests none    Patient Stated Goals to be able to walk better   Currently in Pain? No/denies  stays moderate    Pain Location Back   Pain Orientation Lower;Right   Pain Descriptors / Indicators Aching;Sore;Tightness   Pain Type Chronic pain   Pain Radiating Towards legs, post thigh    Pain Onset More than a month ago   Pain Frequency Intermittent   Aggravating Factors  standing, walking    Pain Relieving Factors resting , sitting, NO meds (on Methotrexate)    Effect of Pain on Daily Activities limits your activity             [facility] PT Assessment - 11/02/15 1442      Assessment   Medical Diagnosis bilateral knee pain, OA, swelling    Referring Provider Dr. Stevie Kern   Onset Date/Surgical Date --  chronic   Prior Therapy For TKA     Precautions   Precautions None     Restrictions  Weight Bearing Restrictions No     Balance Screen   Has the patient fallen in the past 6 months No   How many times? near falls   Has the patient had a decrease in activity level because of a fear of falling?  Yes   Is the patient reluctant to leave their home because of a fear of falling?  Yes     [place] Private residence   Living Arrangements Children   Type of [place] to enter   Entrance Stairs-Number of Steps 3   [place] Two level   Additional Comments has automatic chair for 2 nd level      Prior Function   Level of Independence Independent   Vocation Retired   [employer]   Leisure gardening, Furniture conservator/restorer   Light Touch Appears Intact   Additional Comments both legs go numb at times      Posture/Postural Control   Posture/Postural Control Postural limitations   Postural Limitations Rounded  Shoulders;Forward head;Right pelvic obliquity;Flexed trunk   Posture Comments L shoulder higher      AROM   Right Knee Flexion 107   Left Knee Flexion 109     Strength   Right Hip Flexion 4/5   Right Hip ABduction 3-/5   Left Hip Flexion 4-/5   Left Hip ABduction 2+/5   Right Knee Flexion 4+/5   Right Knee Extension 4+/5   Left Knee Flexion 4+/5   Left Knee Extension 4+/5     Palpation   Palpation comment grossly sore in bilateral lumbar paraspinals and lateral hip      Trendelenburg Test   Findings Positive   Side Left     Ambulation/Gait   Ambulation/Gait Yes   Ambulation/Gait Assistance 7: Independent   Ambulation Distance (Feet) 150 Feet   Assistive device None   Gait Pattern Decreased arm swing - right;Decreased arm swing - left;Decreased stance time - left;Decreased hip/knee flexion - right;Decreased hip/knee flexion - left;Trendelenburg;Antalgic;Lateral hip instability;Lateral trunk lean to right                           PT Education - 11/03/15 1000    Education provided Yes   Education Details PT/POC, gait, glute medius role in stabilizing pelvis with gait, use of cane, HEP   Person(s) Educated Patient   Methods Explanation;Demonstration;Verbal cues;Handout   Comprehension Need further instruction;Verbalized understanding          PT Short Term Goals - 11/03/15 1011      PT SHORT TERM GOAL #1   Title Pt will be I with initial HEP for LE, core    Time 4   Period Weeks   Status New     PT SHORT TERM GOAL #2   Title Pt will be able to stand for longer periods in her home for light housework, ADLs (pain decreased 25%) in LE, back.    Time 4   Period Weeks   Status New           PT Long Term Goals - 11/03/15 1249      PT LONG TERM GOAL #1   Title Pt wil be able to understand posture, body mechanics and RICE to prevent further injury.    Time 8   Period Weeks   Status New     PT LONG TERM GOAL #2  Title Pt will be able to  walk to and from mailbox each day with 25% greater ease,less pain in back   Time 8   Period Weeks   Status New     PT LONG TERM GOAL #3   Title Pt will be able to demo hip abd strength to 3+/5 or more each LE to normalize gait.    Time 8   Period Weeks   Status New     PT LONG TERM GOAL #4   Title Pt will transition to community fitness (pool?) for continued exercise and mobility.    Time 8   Period Weeks   Status New               Plan - 11/23/2015 1002    Clinical Impression Statement Pt presents for low complexity evaluation for bilateral leg pain, swelling and abnormal gait.  She has LE edema due to venous insufficiency, pain with standing/walking and abnormal gait that causes low back pain.  She has a Trendelenburg gait and leans to the Rt. which shortens her Rt. trunk.  She will work towards normalizing her gait to reduce strain on low back.  She needs a community based fitness plan for long term health, may benefit from aquatics.    Rehab Potential Good   PT Frequency 2x / week   PT Duration 8 weeks   PT Treatment/Interventions Therapeutic activities;Taping;Manual techniques;Manual lymph drainage;Passive range of motion;Patient/family education;Functional mobility training;Electrical Stimulation;Gait training;Stair training;Cryotherapy;DME Instruction;Therapeutic exercise;ADLs/Self Care Home Management;Moist Heat;Neuromuscular re-education;Scar mobilization   PT Next Visit Plan check HEP, gait activities and standing hip/core   PT Home Exercise Plan sidelying QL and clam    Consulted and Agree with Plan of Care Patient      Patient will benefit from skilled therapeutic intervention in order to improve the following deficits and impairments:  Abnormal gait, Difficulty walking, Increased fascial restricitons, Obesity, Postural dysfunction, Increased edema, Impaired sensation, Decreased strength, Decreased mobility, Decreased balance, Decreased scar mobility, Impaired  flexibility, Improper body mechanics, Decreased activity tolerance, Decreased endurance, Decreased range of motion  Visit Diagnosis: Other abnormalities of gait and mobility  Muscle weakness (generalized)  Abnormal posture  Stiffness of left knee, not elsewhere classified  Stiffness of right knee, not elsewhere classified      G-Codes - 11/23/15 1256    Functional Assessment Tool Used clinical judgement   Functional Limitation Mobility: Walking and moving around   Mobility: Walking and Moving Around Current Status [phone]) At least 40 percent but less than 60 percent impaired, limited or restricted   Mobility: Walking and Moving Around Goal Status [id number]) At least 20 percent but less than 40 percent impaired, limited or restricted       Problem List Patient Active Problem List   Diagnosis Date Noted  . Bilateral lower extremity edema 10/26/2015  . Total knee replacement status 12/09/2013  . Breast mass, left 01/18/2011  . Obesity 12/08/2009  . Venous (peripheral) insufficiency 07/28/2009  . GALACTORRHEA 07/28/2009  . PAIN IN JOINT, ANKLE AND FOOT 01/12/2008  . Obstructive sleep apnea 08/29/2007  . GAIT DISTURBANCE 08/14/2007  . Depression 08/26/2006  . Essential hypertension 08/26/2006  . Osteoarthritis 08/26/2006  . Urinary incontinence 08/26/2006    Cheryle Dark 2015/11/23, 12:57 PM  [place] [facility] [address] [place], [place], [address] Phone: [phone]   Fax:  [phone]  Name: KELISE KOPERA MRN: [id number] Date of Birth: 1945/12/04  Raeford Razor, PT 11/23/15 12:58 PM Phone: [phone] Fax: [phone]

## 2015-11-08 ENCOUNTER — Ambulatory Visit: Payer: Medicare HMO | Attending: Family Medicine | Admitting: Physical Therapy

## 2015-11-08 DIAGNOSIS — R2689 Other abnormalities of gait and mobility: Secondary | ICD-10-CM | POA: Diagnosis not present

## 2015-11-08 DIAGNOSIS — R293 Abnormal posture: Secondary | ICD-10-CM | POA: Insufficient documentation

## 2015-11-08 DIAGNOSIS — M6281 Muscle weakness (generalized): Secondary | ICD-10-CM | POA: Insufficient documentation

## 2015-11-08 DIAGNOSIS — M25662 Stiffness of left knee, not elsewhere classified: Secondary | ICD-10-CM

## 2015-11-08 DIAGNOSIS — M25661 Stiffness of right knee, not elsewhere classified: Secondary | ICD-10-CM | POA: Insufficient documentation

## 2015-11-08 NOTE — Patient Instructions (Signed)

## 2015-11-08 NOTE — Therapy (Signed)
[place], [place], [address] Phone: [phone]   Fax:  [phone]  Physical Therapy Treatment  Patient Details  Name: Taylor Mccall MRN: [id number] Date of Birth: 14-Jun-1945 Referring Provider: Dr. Stevie Kern  Encounter Date: 11/08/2015      PT End of Session - 11/08/15 1053    Visit Number 2   Number of Visits 12   Date for PT Re-Evaluation 12/29/15   PT Start Time 0847   PT Stop Time 0933   PT Time Calculation (min) 46 min   Activity Tolerance Patient tolerated treatment well   Behavior During Therapy [facility] for tasks assessed/performed      Past Medical History:  Diagnosis Date  . Anxiety   . Breast mass, left   . Depression   . Gait disturbance   . Hypertension   . Obesity   . Osteoarthritis   . Rheumatoid arthritis(714.0)   . Sleep apnea    cpap  . Urinary incontinence   . Venous insufficiency     Past Surgical History:  Procedure Laterality Date  . ABDOMINAL HYSTERECTOMY    . BLADDER SURGERY     sling  . BLADDER SUSPENSION  2009  . BREAST REDUCTION SURGERY    . COLONOSCOPY    . ESOPHAGOGASTRODUODENOSCOPY    . EYE SURGERY    . REPLACEMENT TOTAL KNEE Left   . TOE SURGERY Left    Left great toe  . TOTAL HIP ARTHROPLASTY    . TOTAL KNEE ARTHROPLASTY Right 12/09/2013   Procedure: Right Total Knee Arthroplasty;  Surgeon: Newt Minion, MD;  Location: [place];  Service: Orthopedics;  Laterality: Right;  Marland Kitchen VESICOVAGINAL FISTULA CLOSURE W/ TAH      There were no vitals filed for this visit.      Subjective Assessment - 11/08/15 0915    Subjective No pain.   Currently in Pain? No/denies   Pain Location Back   Pain Orientation Right;Lower   Pain Descriptors / Indicators Aching;Sore   Pain Radiating Towards mid gluteal legs   Pain Frequency Intermittent   Aggravating Factors  sit to stand, standing For ADFL's , walking.    Pain Relieving Factors rest   Effect of Pain on  Daily Activities limits activity                         [facility] Adult PT Treatment/Exercise - 11/08/15 0001      Self-Care   Self-Care ADL's;Other Self-Care Comments   ADL's Demonstrated correct techniques, handout reviewed.  Work Paramedic.   Other Self-Care Comments  Patient had figured correct techniques on her own and others she has been doing incorrectly.  She was engaged with education.      Lumbar Exercises: Stretches   ITB Stretch 3 reps;20 seconds   ITB Stretch Limitations Noted increased comfort, ROM by patient     Lumbar Exercises: Seated   Sit to Stand Limitations scoot to side for sit to stand to avoid activation of QL.,  less pain   Other Seated Lumbar Exercises Discussion about pool exercises.  Arthritis program suggested vs aerobic exercises.  discussed benifits, duration shorter vs longer initially.      Lumbar Exercises: Sidelying   Clam 10 reps   Clam Limitations Right side,  instruction for technique,   Top thigh "Longrer" avoid rolling hips. able to lift smaller motions correctly, difficult.  PT Education - 11/08/15 1052    Education provided Yes   Education Details ADL, exercise cues,  Post PT pool ex info. / ideas.   Methods Explanation;Demonstration;Tactile cues;Verbal cues;Handout   Comprehension Verbalized understanding;Returned demonstration          PT Short Term Goals - 11/08/15 1056      PT SHORT TERM GOAL #1   Title Pt will be I with initial HEP for LE, core    Baseline cues needed   Time 4   Period Weeks   Status On-going     PT SHORT TERM GOAL #2   Title Pt will be able to stand for longer periods in her home for light housework, ADLs (pain decreased 25%) in LE, back.    Time 4   Status On-going           PT Long Term Goals - 11/03/15 1249      PT LONG TERM GOAL #1   Title Pt wil be able to understand posture, body mechanics and RICE to prevent further injury.    Time 8   Period  Weeks   Status New     PT LONG TERM GOAL #2   Title Pt will be able to walk to and from mailbox each day with 25% greater ease,less pain in back   Time 8   Period Weeks   Status New     PT LONG TERM GOAL #3   Title Pt will be able to demo hip abd strength to 3+/5 or more each LE to normalize gait.    Time 8   Period Weeks   Status New     PT LONG TERM GOAL #4   Title Pt will transition to community fitness (pool?) for continued exercise and mobility.    Time 8   Period Weeks   Status New               Plan - 11/08/15 1054    Clinical Impression Statement Progress toward home exercise goal, Posture and ADL and post PT exercise goals.  With beginning education/exercises. Less pain with sit to stand with scoot to side with standing.   PT Next Visit Plan continue clams,  standing hip/core, gait, answer any ADL questions.    PT Home Exercise Plan continue,  Try some good ADL techniques   Consulted and Agree with Plan of Care Patient      Patient will benefit from skilled therapeutic intervention in order to improve the following deficits and impairments:  Abnormal gait, Difficulty walking, Increased fascial restricitons, Obesity, Postural dysfunction, Increased edema, Impaired sensation, Decreased strength, Decreased mobility, Decreased balance, Decreased scar mobility, Impaired flexibility, Improper body mechanics, Decreased activity tolerance, Decreased endurance, Decreased range of motion  Visit Diagnosis: Other abnormalities of gait and mobility  Muscle weakness (generalized)  Abnormal posture  Stiffness of left knee, not elsewhere classified  Stiffness of right knee, not elsewhere classified     Problem List Patient Active Problem List   Diagnosis Date Noted  . Bilateral lower extremity edema 10/26/2015  . Total knee replacement status 12/09/2013  . Breast mass, left 01/18/2011  . Obesity 12/08/2009  . Venous (peripheral) insufficiency 07/28/2009  .  GALACTORRHEA 07/28/2009  . PAIN IN JOINT, ANKLE AND FOOT 01/12/2008  . Obstructive sleep apnea 08/29/2007  . GAIT DISTURBANCE 08/14/2007  . Depression 08/26/2006  . Essential hypertension 08/26/2006  . Osteoarthritis 08/26/2006  . Urinary incontinence 08/26/2006    HARRIS,KAREN PTA 11/08/2015, 10:57 AM  [place], [place], [address] Phone: [phone]   Fax:  [phone]  Name: Taylor Mccall MRN: [id number] Date of Birth: [birth year]

## 2015-11-09 ENCOUNTER — Encounter: Payer: No Typology Code available for payment source | Admitting: Family Medicine

## 2015-11-10 ENCOUNTER — Ambulatory Visit: Payer: Medicare HMO | Admitting: Physical Therapy

## 2015-11-10 DIAGNOSIS — M25662 Stiffness of left knee, not elsewhere classified: Secondary | ICD-10-CM

## 2015-11-10 DIAGNOSIS — M6281 Muscle weakness (generalized): Secondary | ICD-10-CM | POA: Diagnosis not present

## 2015-11-10 DIAGNOSIS — M25661 Stiffness of right knee, not elsewhere classified: Secondary | ICD-10-CM

## 2015-11-10 DIAGNOSIS — R2689 Other abnormalities of gait and mobility: Secondary | ICD-10-CM

## 2015-11-10 DIAGNOSIS — R293 Abnormal posture: Secondary | ICD-10-CM | POA: Diagnosis not present

## 2015-11-10 NOTE — Therapy (Signed)
[facility] Outpatient [facility] [address] [place], [place], [address] Phone: [phone]   Fax:  [phone]  Physical Therapy Treatment  Patient Details  Name: Taylor Mccall MRN: [id number] Date of Birth: [birth year] Referring Provider: Dr. Kelle Darting  Encounter Date: 11/10/2015      PT End of Session - 11/10/15 1025    Visit Number 3   Number of Visits 12   Date for PT Re-Evaluation 12/29/15   PT Start Time 0930   PT Stop Time 1015   PT Time Calculation (min) 45 min      Past Medical History:  Diagnosis Date  . Anxiety   . Breast mass, left   . Depression   . Gait disturbance   . Hypertension   . Obesity   . Osteoarthritis   . Rheumatoid arthritis(714.0)   . Sleep apnea    cpap  . Urinary incontinence   . Venous insufficiency     Past Surgical History:  Procedure Laterality Date  . ABDOMINAL HYSTERECTOMY    . BLADDER SURGERY     sling  . BLADDER SUSPENSION  2009  . BREAST REDUCTION SURGERY    . COLONOSCOPY    . ESOPHAGOGASTRODUODENOSCOPY    . EYE SURGERY    . REPLACEMENT TOTAL KNEE Left   . TOE SURGERY Left    Left great toe  . TOTAL HIP ARTHROPLASTY    . TOTAL KNEE ARTHROPLASTY Right 12/09/2013   Procedure: Right Total Knee Arthroplasty;  Surgeon: Nadara Mustard, MD;  Location: [facility] OR;  Service: Orthopedics;  Laterality: Right;  Marland Kitchen VESICOVAGINAL FISTULA CLOSURE W/ TAH      There were no vitals filed for this visit.      Subjective Assessment - 11/10/15 0937    Subjective I have pain in knees and thighs with pullings my legs up.   Currently in Pain? No/denies            [facility] PT Assessment - 11/10/15 0001      Strength   Right/Left Hip Right;Left   Right Hip Extension 3-/5   Left Hip Extension 3-/5                     [facility] Adult PT Treatment/Exercise - 11/10/15 0001      Lumbar Exercises: Seated   Sit to Stand 10 reps  without UE from Airex pad, hip hinge     Lumbar Exercises:  Supine   Other Supine Lumbar Exercises Supine green band clams 5 sec holds single and bilateral.      Lumbar Exercises: Sidelying   Clam 20 reps  bilateral   Clam Limitations instruction for technique,   Top thigh "Longrer" avoid rolling hips. able to lift smaller motions correctly, difficult.     Knee/Hip Exercises: Stretches   Hip Flexor Stretch Right;Left;60 seconds;1 rep   Hip Flexor Stretch Limitations edge of low table, then moved to edge of high mat table to increase stretch   Piriformis Stretch Limitations modified figure four 30  sec bilateral                PT Education - 11/10/15 1027    Education provided Yes   Education Details Hip flexor stretch   Person(s) Educated Patient   Methods Explanation;Handout   Comprehension Verbalized understanding          PT Short Term Goals - 11/08/15 1056      PT SHORT TERM GOAL #1   Title Pt  will be I with initial HEP for LE, core    Baseline cues needed   Time 4   Period Weeks   Status On-going     PT SHORT TERM GOAL #2   Title Pt will be able to stand for longer periods in her home for light housework, ADLs (pain decreased 25%) in LE, back.    Time 4   Status On-going           PT Long Term Goals - 11/03/15 1249      PT LONG TERM GOAL #1   Title Pt wil be able to understand posture, body mechanics and RICE to prevent further injury.    Time 8   Period Weeks   Status New     PT LONG TERM GOAL #2   Title Pt will be able to walk to and from mailbox each day with 25% greater ease,less pain in back   Time 8   Period Weeks   Status New     PT LONG TERM GOAL #3   Title Pt will be able to demo hip abd strength to 3+/5 or more each LE to normalize gait.    Time 8   Period Weeks   Status New     PT LONG TERM GOAL #4   Title Pt will transition to community fitness (pool?) for continued exercise and mobility.    Time 8   Period Weeks   Status New               Plan - 11/10/15 1027     Clinical Impression Statement Pt reports she is attempting to modify ADLs with new body mechanics suggestions. Review of log roll several times during therex today with mod cues. Pt unable to rise from mat table without assist of hands. She requires assist from hands for hip flexion motions in supine. Thomas stretch reveals anterior hip tightness. Also in prone hip extension strength is 3-/5 bilateral. Added hip flexor stretch of edge of bed to HEP.    PT Next Visit Plan begin 4 way hip in standing next visit. Review hip flexor stretch and clams; try Glute bridge; reinforce Body mechanics answer ADL questions.; core, gait.    PT Home Exercise Plan side clams, hip flexor stretch, Side QL; practice body mechanics      Patient will benefit from skilled therapeutic intervention in order to improve the following deficits and impairments:  Abnormal gait, Difficulty walking, Increased fascial restricitons, Obesity, Postural dysfunction, Increased edema, Impaired sensation, Decreased strength, Decreased mobility, Decreased balance, Decreased scar mobility, Impaired flexibility, Improper body mechanics, Decreased activity tolerance, Decreased endurance, Decreased range of motion  Visit Diagnosis: Other abnormalities of gait and mobility  Muscle weakness (generalized)  Abnormal posture  Stiffness of left knee, not elsewhere classified  Stiffness of right knee, not elsewhere classified     Problem List Patient Active Problem List   Diagnosis Date Noted  . Bilateral lower extremity edema 10/26/2015  . Total knee replacement status 12/09/2013  . Breast mass, left 01/18/2011  . Obesity 12/08/2009  . Venous (peripheral) insufficiency 07/28/2009  . GALACTORRHEA 07/28/2009  . PAIN IN JOINT, ANKLE AND FOOT 01/12/2008  . Obstructive sleep apnea 08/29/2007  . GAIT DISTURBANCE 08/14/2007  . Depression 08/26/2006  . Essential hypertension 08/26/2006  . Osteoarthritis 08/26/2006  . Urinary incontinence  08/26/2006    Dorene Ar, PTA 11/10/2015, 10:38 AM  [facility] [address] [place], [place], [address]  Phone: [phone]   Fax:  [phone]  Name: Taylor Mccall MRN: [id number] Date of Birth: [birth year]

## 2015-11-10 NOTE — Patient Instructions (Signed)
Hip Flexor Stretch    Lying on back near edge of bed, bend one leg, foot flat. Hang other leg over edge, relaxed, thigh resting entirely on bed for __2__ minutes. Repeat ___1_ times. Do 2____ sessions per day. Advanced Exercise: Bend knee back keeping thigh in contact with bed.

## 2015-11-14 ENCOUNTER — Ambulatory Visit: Payer: Medicare HMO | Admitting: Physical Therapy

## 2015-11-14 VITALS — BP 166/96

## 2015-11-14 DIAGNOSIS — M6281 Muscle weakness (generalized): Secondary | ICD-10-CM | POA: Diagnosis not present

## 2015-11-14 DIAGNOSIS — R2689 Other abnormalities of gait and mobility: Secondary | ICD-10-CM

## 2015-11-14 DIAGNOSIS — M25661 Stiffness of right knee, not elsewhere classified: Secondary | ICD-10-CM

## 2015-11-14 DIAGNOSIS — R293 Abnormal posture: Secondary | ICD-10-CM | POA: Diagnosis not present

## 2015-11-14 DIAGNOSIS — M25662 Stiffness of left knee, not elsewhere classified: Secondary | ICD-10-CM | POA: Diagnosis not present

## 2015-11-14 NOTE — Therapy (Signed)
Taylor Mccall, [place], [address] Phone: [phone]   Fax:  [phone]  Physical Therapy Treatment  Patient Details  Name: Taylor Mccall MRN: [id number] Date of Birth: [birth year] Referring Provider: Dr. Stevie Kern  Encounter Date: 11/14/2015      PT End of Session - 11/14/15 1321    Visit Number 4   Number of Visits 12   Date for PT Re-Evaluation 12/29/15   PT Start Time [id number]   PT Stop Time 1105  last 5 min was taking BP   PT Time Calculation (min) 30 min   Activity Tolerance Patient tolerated treatment well   Behavior During Therapy [facility] for tasks assessed/performed      Past Medical History:  Diagnosis Date  . Anxiety   . Breast mass, left   . Depression   . Gait disturbance   . Hypertension   . Obesity   . Osteoarthritis   . Rheumatoid arthritis(714.0)   . Sleep apnea    cpap  . Urinary incontinence   . Venous insufficiency     Past Surgical History:  Procedure Laterality Date  . ABDOMINAL HYSTERECTOMY    . BLADDER SURGERY     sling  . BLADDER SUSPENSION  2009  . BREAST REDUCTION SURGERY    . COLONOSCOPY    . ESOPHAGOGASTRODUODENOSCOPY    . EYE SURGERY    . REPLACEMENT TOTAL KNEE Left   . TOE SURGERY Left    Left great toe  . TOTAL HIP ARTHROPLASTY    . TOTAL KNEE ARTHROPLASTY Right 12/09/2013   Procedure: Right Total Knee Arthroplasty;  Surgeon: Newt Minion, MD;  Location: [place] [place];  Service: Orthopedics;  Laterality: Right;  Marland Kitchen VESICOVAGINAL FISTULA CLOSURE W/ TAH      Vitals:   11/14/15 1039  BP: (!) 166/96        Subjective Assessment - 11/14/15 1039    Subjective My back hurts about a 4/10 today.  Yesterday  I hurt all over.  Pt arr 20 min late.    Currently in Pain? Yes   Pain Score 4    Pain Location Back   Pain Descriptors / Indicators Aching   Pain Type Chronic pain   Pain Onset More than a month ago   Pain Frequency Intermittent            [facility]  Adult PT Treatment/Exercise - 11/14/15 1041      Lumbar Exercises: Stretches   Pelvic Tilt 5 reps   Pelvic Tilt Limitations followed by wall squats 1 /4 squat 2 x 15 reps       Lumbar Exercises: Supine   Clam 20 reps   Clam Limitations green   Bridge 10 reps     Knee/Hip Exercises: Stretches   Piriformis Stretch 3 reps   Piriformis Stretch Limitations knees together and LTR      Knee/Hip Exercises: Standing   Hip Abduction Stengthening;Both;1 set;15 reps   Hip Extension Stengthening;Both;1 set;15 reps;Knee bent                  PT Short Term Goals - 11/08/15 1056      PT SHORT TERM GOAL #1   Title Pt will be I with initial HEP for LE, core    Baseline cues needed   Time 4   Period Weeks   Status On-going     PT SHORT TERM GOAL #2   Title Pt will be able to stand for  longer periods in her home for light housework, ADLs (pain decreased 25%) in LE, back.    Time 4   Status On-going           PT Long Term Goals - 11/03/15 1249      PT LONG TERM GOAL #1   Title Pt wil be able to understand posture, body mechanics and RICE to prevent further injury.    Time 8   Period Weeks   Status New     PT LONG TERM GOAL #2   Title Pt will be able to walk to and from mailbox each day with 25% greater ease,less pain in back   Time 8   Period Weeks   Status New     PT LONG TERM GOAL #3   Title Pt will be able to demo hip abd strength to 3+/5 or more each LE to normalize gait.    Time 8   Period Weeks   Status New     PT LONG TERM GOAL #4   Title Pt will transition to community fitness (pool?) for continued exercise and mobility.    Time 8   Period Weeks   Status New               Plan - 11/14/15 1319    Clinical Impression Statement Has been doing her HEP.  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  Pt with elevated BP end of session.  Deferred NuStep due to this reason.  Sees MD Wed.     PT Next Visit Plan begin 4 way hip in standing  next visit. Review hip flexor stretch and clams; try Glute bridge; reinforce Body mechanics answer ADL questions.; core, gait.    PT Home Exercise Plan side clams, hip flexor stretch, Side QL; practice body mechanics   Consulted and Agree with Plan of Care Patient      Patient will benefit from skilled therapeutic intervention in order to improve the following deficits and impairments:  Abnormal gait, Difficulty walking, Increased fascial restricitons, Obesity, Postural dysfunction, Increased edema, Impaired sensation, Decreased strength, Decreased mobility, Decreased balance, Decreased scar mobility, Impaired flexibility, Improper body mechanics, Decreased activity tolerance, Decreased endurance, Decreased range of motion  Visit Diagnosis: Other abnormalities of gait and mobility  Muscle weakness (generalized)  Abnormal posture  Stiffness of left knee, not elsewhere classified  Stiffness of right knee, not elsewhere classified     Problem List Patient Active Problem List   Diagnosis Date Noted  . Bilateral lower extremity edema 10/26/2015  . Total knee replacement status 12/09/2013  . Breast mass, left 01/18/2011  . Obesity 12/08/2009  . Venous (peripheral) insufficiency 07/28/2009  . GALACTORRHEA 07/28/2009  . PAIN IN JOINT, ANKLE AND FOOT 01/12/2008  . Obstructive sleep apnea 08/29/2007  . GAIT DISTURBANCE 08/14/2007  . Depression 08/26/2006  . Essential hypertension 08/26/2006  . Osteoarthritis 08/26/2006  . Urinary incontinence 08/26/2006    [name] 11/14/2015, 1:22 PM  [facility] [address] [place], [place], [address] Phone: [phone]   Fax:  [phone]  Name: Taylor Mccall MRN: [id number] Date of Birth: [birth year]  Raeford Razor, PT 11/14/15 1:23 PM Phone: [phone] Fax: [phone]

## 2015-11-16 ENCOUNTER — Encounter: Payer: Self-pay | Admitting: Family Medicine

## 2015-11-16 ENCOUNTER — Ambulatory Visit (INDEPENDENT_AMBULATORY_CARE_PROVIDER_SITE_OTHER): Payer: Medicare HMO | Admitting: Family Medicine

## 2015-11-16 ENCOUNTER — Ambulatory Visit: Payer: Medicare HMO | Admitting: Physical Therapy

## 2015-11-16 VITALS — BP 152/82 | HR 94 | Temp 98.3°F | Wt 274.7 lb

## 2015-11-16 DIAGNOSIS — M25662 Stiffness of left knee, not elsewhere classified: Secondary | ICD-10-CM | POA: Diagnosis not present

## 2015-11-16 DIAGNOSIS — M6281 Muscle weakness (generalized): Secondary | ICD-10-CM | POA: Diagnosis not present

## 2015-11-16 DIAGNOSIS — R293 Abnormal posture: Secondary | ICD-10-CM | POA: Diagnosis not present

## 2015-11-16 DIAGNOSIS — R2689 Other abnormalities of gait and mobility: Secondary | ICD-10-CM | POA: Diagnosis not present

## 2015-11-16 DIAGNOSIS — M25661 Stiffness of right knee, not elsewhere classified: Secondary | ICD-10-CM

## 2015-11-16 DIAGNOSIS — I10 Essential (primary) hypertension: Secondary | ICD-10-CM

## 2015-11-16 MED ORDER — HYDROCODONE-HOMATROPINE 5-1.5 MG/5ML PO SYRP: 5.0000 mL | ORAL_SOLUTION | Freq: Three times a day (TID) | ORAL | 0 refills | Status: DC | PRN

## 2015-11-16 MED ORDER — LOSARTAN POTASSIUM 50 MG PO TABS: 50.0000 mg | ORAL_TABLET | Freq: Every day | ORAL | 3 refills | Status: DC

## 2015-11-16 NOTE — Progress Notes (Signed)
Pre visit review using our clinic review tool, if applicable. No additional management support is needed unless otherwise documented below in the visit note. 

## 2015-11-16 NOTE — Patient Instructions (Signed)
Cozaar 50 mg......... one tablet daily in the morning  Check your blood pressure daily in the morning  Blood pressure goal 135/85 or less. If after one month your blood pressure is at goal then continue that dose and check your blood pressure weekly  If after one month your blood pressures not at goal return for reevaluation

## 2015-11-16 NOTE — Therapy (Signed)
[name], [place], [address] Phone: [phone]   Fax:  [phone]  Physical Therapy Treatment  Patient Details  Name: Taylor Mccall MRN: [id number] Date of Birth: 1946/01/19 Referring Provider: Dr. Stevie Kern  Encounter Date: 11/16/2015      PT End of Session - 11/16/15 1529    Visit Number 5   Number of Visits 12   Date for PT Re-Evaluation 12/29/15   PT Start Time 1335   PT Stop Time 1415   PT Time Calculation (min) 40 min   Activity Tolerance Patient tolerated treatment well   Behavior During Therapy [facility] for tasks assessed/performed      Past Medical History:  Diagnosis Date  . Anxiety   . Breast mass, left   . Depression   . Gait disturbance   . Hypertension   . Obesity   . Osteoarthritis   . Rheumatoid arthritis(714.0)   . Sleep apnea    cpap  . Urinary incontinence   . Venous insufficiency     Past Surgical History:  Procedure Laterality Date  . ABDOMINAL HYSTERECTOMY    . BLADDER SURGERY     sling  . BLADDER SUSPENSION  2009  . BREAST REDUCTION SURGERY    . COLONOSCOPY    . ESOPHAGOGASTRODUODENOSCOPY    . EYE SURGERY    . REPLACEMENT TOTAL KNEE Left   . TOE SURGERY Left    Left great toe  . TOTAL HIP ARTHROPLASTY    . TOTAL KNEE ARTHROPLASTY Right 12/09/2013   Procedure: Right Total Knee Arthroplasty;  Surgeon: Newt Minion, MD;  Location: [place];  Service: Orthopedics;  Laterality: Right;  Marland Kitchen VESICOVAGINAL FISTULA CLOSURE W/ TAH      There were no vitals filed for this visit.      Subjective Assessment - 11/16/15 1520    Subjective I have no pain today.  PT has been helping me gey stronger.  I want to walk better.   Currently in Pain? No/denies   Pain Location Back                         [facility] Adult PT Treatment/Exercise - 11/16/15 0001      Ambulation/Gait   Ambulation Surface Indoor   Pre-Gait Activities weightshifting, various  directions using mirror and parallel bars,  som exercises similar also done at counter,   Gait Comments use of hips imcreased ans did trunk rotation.      Lumbar Exercises: Standing   Wall Slides 10 reps   Wall Slides Limitations cues.  some right knee pain after, mild distal patella.      Knee/Hip Exercises: Stretches   Sports administrator 1 rep;20 seconds  with soft tissue work.      Knee/Hip Exercises: Standing   Walking with Sports Cord Cable cross pulling 10 pounds 5 X forward/reverse, min assist with speed initially retro walking.                  PT Education - 11/16/15 1529    Education provided Yes   Education Details gait training   Person(s) Educated Patient   Methods Explanation;Demonstration;Tactile cues;Verbal cues   Comprehension Verbalized understanding;Returned demonstration          PT Short Term Goals - 11/16/15 1531      PT SHORT TERM GOAL #1   Title Pt will be I with initial HEP for LE, core    Time  4   Period Weeks   Status Unable to assess     PT SHORT TERM GOAL #2   Title Pt will be able to stand for longer periods in her home for light housework, ADLs (pain decreased 25%) in LE, back.    Baseline able to do more in home   Time 4   Period Weeks   Status On-going           PT Long Term Goals - 11/03/15 1249      PT LONG TERM GOAL #1   Title Pt wil be able to understand posture, body mechanics and RICE to prevent further injury.    Time 8   Period Weeks   Status New     PT LONG TERM GOAL #2   Title Pt will be able to walk to and from mailbox each day with 25% greater ease,less pain in back   Time 8   Period Weeks   Status New     PT LONG TERM GOAL #3   Title Pt will be able to demo hip abd strength to 3+/5 or more each LE to normalize gait.    Time 8   Period Weeks   Status New     PT LONG TERM GOAL #4   Title Pt will transition to community fitness (pool?) for continued exercise and mobility.    Time 8   Period Weeks    Status New               Plan - 11/16/15 1530    Clinical Impression Statement Gait has improved with instruction.  Mild pain at end of session Rt knee.  She had mid back pain , brief duting gait training. Less antalgic gait at end of session   PT Next Visit Plan begin 4 way hip in standing next visit. Review hip flexor stretch and clams; try Glute bridge; reinforce Body mechanics answer ADL questions.; core, gait.    PT Home Exercise Plan continue   Consulted and Agree with Plan of Care Patient      Patient will benefit from skilled therapeutic intervention in order to improve the following deficits and impairments:  Abnormal gait, Difficulty walking, Increased fascial restricitons, Obesity, Postural dysfunction, Increased edema, Impaired sensation, Decreased strength, Decreased mobility, Decreased balance, Decreased scar mobility, Impaired flexibility, Improper body mechanics, Decreased activity tolerance, Decreased endurance, Decreased range of motion  Visit Diagnosis: Other abnormalities of gait and mobility  Muscle weakness (generalized)  Abnormal posture  Stiffness of left knee, not elsewhere classified  Stiffness of right knee, not elsewhere classified     Problem List Patient Active Problem List   Diagnosis Date Noted  . Bilateral lower extremity edema 10/26/2015  . Total knee replacement status 12/09/2013  . Breast mass, left 01/18/2011  . Obesity 12/08/2009  . Venous (peripheral) insufficiency 07/28/2009  . GALACTORRHEA 07/28/2009  . PAIN IN JOINT, ANKLE AND FOOT 01/12/2008  . Obstructive sleep apnea 08/29/2007  . GAIT DISTURBANCE 08/14/2007  . Depression 08/26/2006  . Essential hypertension 08/26/2006  . Osteoarthritis 08/26/2006  . Urinary incontinence 08/26/2006    Keylie Beavers PTA 11/16/2015, 3:34 PM  [facility] [address] [place], [place], [address] Phone: [phone]   Fax:   [phone]  Name: Taylor Mccall MRN: [id number] Date of Birth: 1945/02/09

## 2015-11-16 NOTE — Progress Notes (Signed)
Taylor Mccall is a 70 year old female nonsmoker who comes in today for follow-up of elevated blood pressure  We had her doing home blood pressure monitoring for the past month because her blood pressure was elevated. She comes back today for follow-up. All her blood pressures at home except 1 are elevated. Systolics are in the [id number] range diastolics are in the [id number] range.  Physical examination  Vital signs stable she's afebrile except for BP here today 152/82  Hypertension,,,,,,,,,, begin Hyzaar 50 mg daily monitor BP at home for a month. If after one month blood pressure is normalizing continue that dose. If not return for reevaluation

## 2015-11-21 ENCOUNTER — Ambulatory Visit: Payer: Medicare HMO | Admitting: Physical Therapy

## 2015-11-21 DIAGNOSIS — R293 Abnormal posture: Secondary | ICD-10-CM

## 2015-11-21 DIAGNOSIS — M25661 Stiffness of right knee, not elsewhere classified: Secondary | ICD-10-CM

## 2015-11-21 DIAGNOSIS — R2689 Other abnormalities of gait and mobility: Secondary | ICD-10-CM | POA: Diagnosis not present

## 2015-11-21 DIAGNOSIS — M25662 Stiffness of left knee, not elsewhere classified: Secondary | ICD-10-CM | POA: Diagnosis not present

## 2015-11-21 DIAGNOSIS — M6281 Muscle weakness (generalized): Secondary | ICD-10-CM | POA: Diagnosis not present

## 2015-11-21 NOTE — Therapy (Addendum)
[facility] [place], [place], [address] Phone: [phone]   Fax:  [phone]  Physical Therapy Treatment/Discharge  Patient Details  Name: Taylor Mccall MRN: [id number] Date of Birth: Jul 04, 1945 Referring Provider: Dr. Stevie Kern  Encounter Date: 11/21/2015      PT End of Session - 11/21/15 1138    Visit Number 6   Number of Visits 12   Date for PT Re-Evaluation 12/29/15   PT Start Time 0933   PT Stop Time 1015   PT Time Calculation (min) 42 min      Past Medical History:  Diagnosis Date  . Anxiety   . Breast mass, left   . Depression   . Gait disturbance   . Hypertension   . Obesity   . Osteoarthritis   . Rheumatoid arthritis(714.0)   . Sleep apnea    cpap  . Urinary incontinence   . Venous insufficiency     Past Surgical History:  Procedure Laterality Date  . ABDOMINAL HYSTERECTOMY    . BLADDER SURGERY     sling  . BLADDER SUSPENSION  2009  . BREAST REDUCTION SURGERY    . COLONOSCOPY    . ESOPHAGOGASTRODUODENOSCOPY    . EYE SURGERY    . REPLACEMENT TOTAL KNEE Left   . TOE SURGERY Left    Left great toe  . TOTAL HIP ARTHROPLASTY    . TOTAL KNEE ARTHROPLASTY Right 12/09/2013   Procedure: Right Total Knee Arthroplasty;  Surgeon: Newt Minion, MD;  Location: [facility];  Service: Orthopedics;  Laterality: Right;  Marland Kitchen VESICOVAGINAL FISTULA CLOSURE W/ TAH      There were no vitals filed for this visit.      Subjective Assessment - 11/21/15 0938    Subjective It's raining and I don't have any pain.    Currently in Pain? No/denies                         [facility] Adult PT Treatment/Exercise - 11/21/15 0001      Lumbar Exercises: Aerobic   Stationary Bike Nustep L 5 UE/LE x 7 min     Lumbar Exercises: Standing   Wall Slides 10 reps   Wall Slides Limitations cues.  some right knee pain after, mild distal patella.      Lumbar Exercises: Seated   Sit to Stand 10 reps   without UE from Airex pad, hip hinge     Lumbar Exercises: Supine   Ab Set 10 reps   Clam 20 reps   Clam Limitations blue   Bent Knee Raise 20 reps   Bent Knee Raise Limitations cues for neutral and breathing      Knee/Hip Exercises: Standing   Other Standing Knee Exercises 3 way hip x 10 bilateral each way                PT Education - 11/21/15 1010    Education provided Yes   Education Details HEP    Person(s) Educated Patient   Methods Explanation;Handout   Comprehension Verbalized understanding          PT Short Term Goals - 11/21/15 0941      PT SHORT TERM GOAL #1   Title Pt will be I with initial HEP for LE, core    Baseline does some of them   Time 4   Period Weeks   Status Partially Met     PT SHORT TERM GOAL #  2   Title Pt will be able to stand for longer periods in her home for light housework, ADLs (pain decreased 25%) in LE, back.    Baseline 20% decrease in pain with standing/ ADLS   Time 4   Period Weeks   Status Partially Met           PT Long Term Goals - 11/03/15 1249      PT LONG TERM GOAL #1   Title Pt wil be able to understand posture, body mechanics and RICE to prevent further injury.    Time 8   Period Weeks   Status New     PT LONG TERM GOAL #2   Title Pt will be able to walk to and from mailbox each day with 25% greater ease,less pain in back   Time 8   Period Weeks   Status New     PT LONG TERM GOAL #3   Title Pt will be able to demo hip abd strength to 3+/5 or more each LE to normalize gait.    Time 8   Period Weeks   Status New     PT LONG TERM GOAL #4   Title Pt will transition to community fitness (pool?) for continued exercise and mobility.    Time 8   Period Weeks   Status New               Plan - 11/21/15 0942    Clinical Impression Statement Pt reports 20% decrease in pain with standing activities in the home. She reports decreased pain with waling to and from the mailbox. She is also not using  RW as much to go to the mailbox. STGs partially MET.    PT Next Visit Plan  review 3 way hip in standing next visit. Review hip flexor stretch and clams; try Glute bridge; reinforce Body mechanics answer ADL questions.; core- review , gait.    PT Home Exercise Plan side clams, hip flexor stretch, QL stretch side, 3 way hip standing, hip hinge sit to stand, core brace with march   Consulted and Agree with Plan of Care Patient      Patient will benefit from skilled therapeutic intervention in order to improve the following deficits and impairments:  Abnormal gait, Difficulty walking, Increased fascial restricitons, Obesity, Postural dysfunction, Increased edema, Impaired sensation, Decreased strength, Decreased mobility, Decreased balance, Decreased scar mobility, Impaired flexibility, Improper body mechanics, Decreased activity tolerance, Decreased endurance, Decreased range of motion  Visit Diagnosis: Other abnormalities of gait and mobility  Muscle weakness (generalized)  Abnormal posture  Stiffness of left knee, not elsewhere classified  Stiffness of right knee, not elsewhere classified     Problem List Patient Active Problem List   Diagnosis Date Noted  . Bilateral lower extremity edema 10/26/2015  . Total knee replacement status 12/09/2013  . Breast mass, left 01/18/2011  . Obesity 12/08/2009  . Venous (peripheral) insufficiency 07/28/2009  . GALACTORRHEA 07/28/2009  . PAIN IN JOINT, ANKLE AND FOOT 01/12/2008  . Obstructive sleep apnea 08/29/2007  . GAIT DISTURBANCE 08/14/2007  . Depression 08/26/2006  . Essential hypertension 08/26/2006  . Osteoarthritis 08/26/2006  . Urinary incontinence 08/26/2006    Dorene Ar , PTA 11/21/2015, 11:41 AM  [facility] [address] [place], [place], [address] Phone: [phone]   Fax:  [phone]  Name: Taylor Mccall MRN: [id number] Date of Birth:  April 22, 1945  PHYSICAL THERAPY DISCHARGE SUMMARY  Visits from Start of  Care: 6/12  Current functional level related to goals / functional outcomes: Unknown did not return    Remaining deficits: See goals for most recent update   Education / Equipment: HEP, RICE  Plan: Patient agrees to discharge.  Patient goals were not met. Patient is being discharged due to not returning since the last visit.  ?????    Raeford Razor, PT 01/11/16 2:18 PM Phone: [phone] Fax: [phone]

## 2015-11-21 NOTE — Patient Instructions (Signed)
Knee High   Holding stable object, raise knee to hip level, then lower knee. Repeat with other knee. Complete __10_ repetitions. Do __2__ sessions per day.  ABDUCTION: Standing (Active)   Stand, feet flat. Lift right leg out to side. Use _0__ lbs. Complete __10_ repetitions. Perform __2_ sessions per day.    EXTENSION: Standing (Active)  Stand, both feet flat. Draw right leg behind body as far as possible. Use 0___ lbs. Complete 10 repetitions. Perform __2_ sessions per day.    Stabilization: Sit to Stand Transfer, Pelvic Floor Contraction    Sit, feet flat, contract abdominals.  Bend forward at hips, stand. Repeat __10__ times per set. Do _1-2___ sets per session. Do _2___ sessions per week.      Isometric Hold With Pelvic Floor (Hook-Lying)  Lie with hips and knees bent. Slowly inhale, and then exhale. Pull navel toward spine and tighten pelvic floor. Hold for __10_ seconds. Continue to breathe in and out during hold. Rest for _10__ seconds. Repeat __10_ times. Do __2-3_ times a day.   Knee Fold  Lie on back, legs bent, arms by sides. Exhale, lifting knee to chest. Inhale, returning. Keep abdominals flat, navel to spine. Repeat __10__ times, alternating legs. Do __2__ sessions per day.

## 2015-11-23 ENCOUNTER — Ambulatory Visit: Payer: Medicare HMO | Admitting: Physical Therapy

## 2015-11-26 DIAGNOSIS — R42 Dizziness and giddiness: Secondary | ICD-10-CM | POA: Diagnosis not present

## 2015-11-28 ENCOUNTER — Telehealth: Payer: Self-pay

## 2015-11-28 ENCOUNTER — Ambulatory Visit (INDEPENDENT_AMBULATORY_CARE_PROVIDER_SITE_OTHER): Payer: Medicare HMO | Admitting: Family Medicine

## 2015-11-28 ENCOUNTER — Ambulatory Visit: Payer: Medicare HMO | Admitting: Physical Therapy

## 2015-11-28 ENCOUNTER — Encounter: Payer: Self-pay | Admitting: Family Medicine

## 2015-11-28 VITALS — BP 132/70 | HR 86 | Temp 98.2°F | Wt 271.8 lb

## 2015-11-28 DIAGNOSIS — I10 Essential (primary) hypertension: Secondary | ICD-10-CM

## 2015-11-28 DIAGNOSIS — R059 Cough, unspecified: Secondary | ICD-10-CM

## 2015-11-28 DIAGNOSIS — R05 Cough: Secondary | ICD-10-CM

## 2015-11-28 NOTE — Progress Notes (Signed)
Pre visit review using our clinic review tool, if applicable. No additional management support is needed unless otherwise documented below in the visit note. 

## 2015-11-28 NOTE — Patient Instructions (Signed)
Please follow up with Dr. Sherren Mocha for BP evaluation in one week. Please document readings and bring your BP cuff with you to your next visit.   Minimal Blood Pressure Goal= AVERAGE < 140/90; Ideal is an AVERAGE < 135/85. This AVERAGE should be calculated from @ least 5-7 BP readings taken @ different times of day on different days of week. You should not respond to isolated BP readings , but rather the AVERAGE for that week .Please bring your blood pressure cuff to office visits to verify that it is reliable.It can also be checked against the blood pressure device at the pharmacy. Finger or wrist cuffs are not dependable; an arm cuff is.  Also, please drink enough fluid so that your urine is pale yellow or clear. You may continue hycodan for cough as needed.  It was a pleasure to meet you today!

## 2015-11-28 NOTE — Telephone Encounter (Signed)
Pt was seen on 11/28/2015 with Almyra Free.   Patient Name: Taylor Mccall Gender: Female DOB: [birth year] Age: 70 Y 1 M 21 D Return Phone Number: [phone] (Primary) Address: [phone] City/State/Zip: [place] [address] Client [facility] - Client Client Site [facility] - Night Physician Christie Nottingham - MD Contact Type Call Who Is Calling Patient / Member / Family / Caregiver Call Type Triage / Clinical Caller Name Dolly Relationship To Patient Self Return Phone Number Please choose phone number Chief Complaint BLOOD PRESSURE LOW - Systolic (top number) 90 or less with dizzy or weak symptoms Reason for Call Symptomatic / Request for Health Information Initial Comment Caller states- my BP is 90/75 PreDisposition Did not know what to do Translation No Nurse Assessment Nurse: Vallery Sa, RN, Tye [place] Date/Time (Eastern Time): 11/26/2015 2:55:19 PM Confirm and document reason for call. If symptomatic, describe symptoms. You must click the next button to save text entered. ---Rosemarie Ax states her blood pressure was 90/75 today. No severe breathing difficulty. No chest pain. No bleeding. Alert and responsive. Has the patient traveled out of the country within the last 30 days? ---No Does the patient have any new or worsening symptoms? ---Yes Will a triage be completed? ---Yes Related visit to physician within the last 2 weeks? ---No Does the PT have any chronic conditions? (i.e. diabetes, asthma, etc.) ---Yes List chronic conditions. ---High Blood Pressure, Chronic Cough Is this a behavioral health or substance abuse call? ---No Guidelines Guideline Title Affirmed Question Affirmed Notes Nurse Date/Time Eilene Ghazi Time) Low Blood Pressure Abdominal pain Vallery Sa, RN, Tye [place] 11/26/2015 2:57:13 PM Disp. Time Eilene Ghazi Time) Disposition Final User 11/26/2015 2:52:11 PM Send to Urgent Lissa Hoard, Eugene Garnet 10/21/

## 2015-11-28 NOTE — Progress Notes (Signed)
Subjective:    Patient ID: Taylor Mccall, female    DOB: 1945/08/29, 70 y.o.   MRN: [id number]  HPI  Taylor Mccall is a 70 year old female who presents today with concerns for her BP which she monitors at home.  She reported that her BP was in the 0000000 for systolic readings and 0000000 for diastolic readings 2 days ago.  BP averages from patient's documentation are noted as Q000111Q systolic and diastolic Q000111Q to [id number].  She has been started on Cozaar 50 mg once daily  On 11/16/2015 and she also was started on hycodan on the same day for a cough.  Today, she denies chest pain, palpitations, SOB, numbness, weakness, or tingling.  Associated symptom of cough that has been present for approximately 3 weeks but is improving per patient report.  Hycodan has provided moderate benefit.  Cough is nonproductive.  Associated symptom of rhinitis and post nasal drip are present today. Denies fever, chills, sweats, sore throat, ear pain, or tooth pain.  She denies recent sick contact exposure or recent antibiotic therapy. She denies asthma/bronchitis.  She is not a smoker She takes methotrexate for RA.  She contacted [facility] with a concern for her BP two days ago  and she was advised to seek medical attention. She declined to do so and then later contacted EMS who came to her home where she was recommended to seek care. BP was noted as 136/81. Patient declined as her BP and work up did not find anything concerning per patient from EMS.  She denies a drop in her BP since this episode 2 days ago where she reports her BP as 90/75.    Review of Systems  Constitutional: Negative for chills, fatigue and fever.  HENT: Positive for postnasal drip and rhinorrhea. Negative for congestion.   Respiratory: Positive for cough. Negative for shortness of breath and wheezing.   Cardiovascular: Negative for chest pain and palpitations.  Gastrointestinal: Negative for abdominal pain, diarrhea, nausea and vomiting.    Genitourinary: Negative for dysuria and hematuria.  Musculoskeletal: Negative for myalgias.  Skin: Negative for rash.  Neurological: Negative for dizziness, weakness, light-headedness, numbness and headaches.   Past Medical History:  Diagnosis Date  . Anxiety   . Breast mass, left   . Depression   . Gait disturbance   . Hypertension   . Obesity   . Osteoarthritis   . Rheumatoid arthritis(714.0)   . Sleep apnea    cpap  . Urinary incontinence   . Venous insufficiency      Social History   Social History  . Marital status: Widowed    Spouse name: N/A  . Number of children: N/A  . Years of education: N/A   Occupational History  . Not on file.   Social History Main Topics  . Smoking status: Former Smoker    Packs/day: 0.30    Years: 10.00    Types: Cigarettes    Quit date: 02/05/1978  . Smokeless tobacco: Never Used     Comment: over 25 years ago...pt doesnt remember when she quit.   . Alcohol use Yes     Comment: occassional/social  . Drug use: No  . Sexual activity: Not on file   Other Topics Concern  . Not on file   Social History Narrative  . No narrative on file    Past Surgical History:  Procedure Laterality Date  . ABDOMINAL HYSTERECTOMY    . BLADDER SURGERY  sling  . BLADDER SUSPENSION  2009  . BREAST REDUCTION SURGERY    . COLONOSCOPY    . ESOPHAGOGASTRODUODENOSCOPY    . EYE SURGERY    . REPLACEMENT TOTAL KNEE Left   . TOE SURGERY Left    Left great toe  . TOTAL HIP ARTHROPLASTY    . TOTAL KNEE ARTHROPLASTY Right 12/09/2013   Procedure: Right Total Knee Arthroplasty;  Surgeon: Newt Minion, MD;  Location: [facility];  Service: Orthopedics;  Laterality: Right;  Marland Kitchen VESICOVAGINAL FISTULA CLOSURE W/ TAH      Family History  Problem Relation Age of Onset  . Diabetes    . Stroke    . Asthma Brother   . Coronary artery disease Father   . Skin cancer Father   . Coronary artery disease Brother     Allergies  Allergen Reactions  . Penicillins  Rash    Current Outpatient Prescriptions on File Prior to Visit  Medication Sig Dispense Refill  . albuterol (PROVENTIL HFA;VENTOLIN HFA) 108 (90 BASE) MCG/ACT inhaler Inhale 2 puffs into the lungs every 6 (six) hours as needed for wheezing or shortness of breath. (Patient not taking: Reported on 11/16/2015) 1 Inhaler 2  . aspirin 81 MG tablet Take 81 mg by mouth daily.    . carboxymethylcellulose (REFRESH TEARS) 0.5 % SOLN Place 2 drops into the right eye as needed (for itchy eyes).    Marland Kitchen erythromycin (ERY-TAB) 500 MG EC tablet Take 500-1,000 mg by mouth 2 (two) times daily as needed (for dental procedures). Take 1000 mg by mouth 1 hour prior to dental appointment and take 500 mg by mouth 6 hours after dental work.    . furosemide (LASIX) 20 MG tablet Take 60 mg every other day. 300 tablet 4  . HYDROcodone-homatropine (HYCODAN) 5-1.5 MG/5ML syrup Take 5 mLs by mouth every 8 (eight) hours as needed. 240 mL 0  . hydroxychloroquine (PLAQUENIL) 200 MG tablet Take 200 mg by mouth 2 (two) times daily.     Marland Kitchen losartan (COZAAR) 50 MG tablet Take 1 tablet (50 mg total) by mouth daily. 100 tablet 3  . methotrexate 2.5 MG tablet Take 15 mg by mouth once a week.    . Multiple Vitamins-Minerals (PRESERVISION AREDS 2) CAPS Take 2 capsules by mouth daily.    . naproxen sodium (ANAPROX) 220 MG tablet Take 220 mg by mouth 2 (two) times daily as needed (for pain).    . NON FORMULARY 1 each by Other route See admin instructions. Use CPAP nightly.    . Omega-3 Fatty Acids (FISH OIL) 600 MG CAPS Take 1 capsule by mouth 2 (two) times daily.    . pantoprazole (PROTONIX) 40 MG tablet Take 1 tablet (40 mg total) by mouth daily before breakfast. (Patient not taking: Reported on 11/16/2015) 30 tablet 11  . potassium chloride SA (K-DUR,KLOR-CON) 20 MEQ tablet Take 2 tablets (40 mEq total) by mouth every morning. 180 tablet 3  . sertraline (ZOLOFT) 100 MG tablet Take 1 tablet (100 mg total) by mouth every morning. 90 tablet 3   . timolol (BETIMOL) 0.5 % ophthalmic solution Place 1 drop into the left eye 2 (two) times daily.    Marland Kitchen trimethoprim (TRIMPEX) 100 MG tablet Take 100 mg by mouth 2 (two) times daily.    . vitamin B-12 (CYANOCOBALAMIN) 1000 MCG tablet Take 1,000 mcg by mouth daily.    . Vitamin D, Cholecalciferol, 1000 UNITS TABS Take 1,000 mg by mouth 1 day or 1 dose.    Marland Kitchen  vitamin E 400 UNIT capsule Take 400 Units by mouth daily.     No current facility-administered medications on file prior to visit.     BP 132/70 (BP Location: Left Arm, Patient Position: Sitting, Cuff Size: Large)   Pulse 86   Temp 98.2 F (36.8 C) (Oral)   Wt 271 lb 12.8 oz (123.3 kg)   SpO2 99%   BMI 41.02 kg/m       Objective:   Physical Exam  Constitutional: She is oriented to person, place, and time. She appears well-developed and well-nourished.  HENT:  Right Ear: Tympanic membrane normal.  Left Ear: Tympanic membrane normal.  Nose: No rhinorrhea. Right sinus exhibits no maxillary sinus tenderness and no frontal sinus tenderness. Left sinus exhibits no maxillary sinus tenderness and no frontal sinus tenderness.  Mouth/Throat: Mucous membranes are normal. No oropharyngeal exudate or posterior oropharyngeal erythema.  Eyes: Pupils are equal, round, and reactive to light. No scleral icterus.  Neck: Neck supple.  Cardiovascular: Normal rate, regular rhythm and intact distal pulses.   Pulmonary/Chest: Effort normal and breath sounds normal. She has no wheezes. She has no rales.  Abdominal: Soft. Bowel sounds are normal. There is no tenderness.  Musculoskeletal: She exhibits no edema.  Lymphadenopathy:    She has no cervical adenopathy.  Neurological: She is alert and oriented to person, place, and time. Coordination normal.  Skin: Skin is warm and dry. No rash noted.  Psychiatric: She has a normal mood and affect. Her behavior is normal. Judgment and thought content normal.       Assessment & Plan:  1. Essential  hypertension Well controlled with Cozaar 50 mg daily. Suspect that report of drop in BP of 90/75 may be an erroneous reading with home BP cuff. Exam is reassuring and patient is not experiencing any adverse effects of this medication. Advised her to continue Cozaar 50 mg daily and monitor BP daily, document readings, and follow up with Dr. Sherren Mocha in one week with readings, cuff, and home readings.  Further advised her to drink plenty of water so that her urine is pale yellow or clear. We discussed symptoms that require immediate medical attention such as chest pain, palpitations, SOB, numbness, tingling, weakness, headaches, syncope, or nosebleeds.   2. Cough Advised hydration and continued use of hycodan for cough as needed. Lungs are CTA and patient is afebrile stating that her cough is improving.  BP parameters and directions for monitoring BP were provided.  Delano Metz, FNP-C

## 2015-11-29 DIAGNOSIS — H3530 Unspecified macular degeneration: Secondary | ICD-10-CM | POA: Insufficient documentation

## 2015-11-29 DIAGNOSIS — Z79899 Other long term (current) drug therapy: Secondary | ICD-10-CM | POA: Insufficient documentation

## 2015-11-29 DIAGNOSIS — M170 Bilateral primary osteoarthritis of knee: Secondary | ICD-10-CM | POA: Insufficient documentation

## 2015-11-29 DIAGNOSIS — E559 Vitamin D deficiency, unspecified: Secondary | ICD-10-CM | POA: Insufficient documentation

## 2015-11-29 DIAGNOSIS — M359 Systemic involvement of connective tissue, unspecified: Secondary | ICD-10-CM | POA: Insufficient documentation

## 2015-11-29 NOTE — Progress Notes (Signed)
*IMAGE* Office Visit Note  Patient: Taylor Mccall             Date of Birth: [birth year]           MRN: [id number]             PCP: Joycelyn Man, MD Referring: Dorena Cookey, MD Visit Date: 11/30/2015    Subjective:  Follow-up   History of Present Illness: Taylor Mccall is a 70 y.o. female with autoimmune disease. Her arthritis seems to be very well controlled with no joint swelling or joint pain. She states that she's been having dry hacking cough for last 3 weeks. By Dr. Sherren Mocha who prescribed cough syrup and it did not help. He was seen by another physician this last Monday and was given some blood pressure medications. She states she was not given any antibiotics. Her cough is not clearing up . According to her blood pressure has been fluctuating a lot. When she feels dizzy at times. She has some discomfort in her knees which are replaced and is going for physical therapy currently.  Activities of Daily Living:  Patient reports morning stiffness for10 minutes.   Patient Reports nocturnal pain.  Difficulty dressing/grooming: Denies Difficulty climbing stairs: Denies Difficulty getting out of chair: Reports Difficulty using hands for taps, buttons, cutlery, and/or writing: Reports   Review of Systems  Constitutional: Positive for fatigue and weakness.  HENT: Positive for mouth sores.   Eyes: Negative.   Respiratory: Positive for cough.   Cardiovascular: Positive for hypertension.  Gastrointestinal: Negative.   Endocrine: Negative.   Genitourinary: Negative.   Musculoskeletal: Positive for arthralgias, joint pain and morning stiffness.  Skin: Negative.   Allergic/Immunologic: Negative.   Neurological: Positive for dizziness.  Hematological: Negative.   Psychiatric/Behavioral: Negative.     PMFS History:  Patient Active Problem List   Diagnosis Date Noted  . Autoimmune disease ([place]) 11/29/2015  . Primary osteoarthritis of both knees 11/29/2015  . High risk  medication use 11/29/2015  . Vitamin D deficiency 11/29/2015  . Macular degeneration 11/29/2015  . Bilateral lower extremity edema 10/26/2015  . Total knee replacement status 12/09/2013  . Breast mass, left 01/18/2011  . Obesity 12/08/2009  . Venous (peripheral) insufficiency 07/28/2009  . GALACTORRHEA 07/28/2009  . PAIN IN JOINT, ANKLE AND FOOT 01/12/2008  . Obstructive sleep apnea 08/29/2007  . GAIT DISTURBANCE 08/14/2007  . Depression 08/26/2006  . Essential hypertension 08/26/2006  . Osteoarthritis 08/26/2006  . Urinary incontinence 08/26/2006    Past Medical History:  Diagnosis Date  . Anxiety   . Breast mass, left   . Depression   . Gait disturbance   . Hypertension   . Obesity   . Osteoarthritis   . Rheumatoid arthritis(714.0)   . Sleep apnea    cpap  . Urinary incontinence   . Venous insufficiency     Family History  Problem Relation Age of Onset  . Diabetes    . Stroke    . Asthma Brother   . Coronary artery disease Father   . Skin cancer Father   . Coronary artery disease Brother    Past Surgical History:  Procedure Laterality Date  . ABDOMINAL HYSTERECTOMY    . BLADDER SURGERY     sling  . BLADDER SUSPENSION  2009  . BREAST REDUCTION SURGERY    . COLONOSCOPY    . ESOPHAGOGASTRODUODENOSCOPY    . EYE SURGERY    . REPLACEMENT TOTAL KNEE Left   . TOE SURGERY  Left    Left great toe  . TOTAL HIP ARTHROPLASTY    . TOTAL KNEE ARTHROPLASTY Right 12/09/2013   Procedure: Right Total Knee Arthroplasty;  Surgeon: Newt Minion, MD;  Location: [facility];  Service: Orthopedics;  Laterality: Right;  Marland Kitchen VESICOVAGINAL FISTULA CLOSURE W/ TAH     Social History   Social History Narrative  . No narrative on file     Objective: Vital Signs: BP (!) 136/103 (BP Location: Left Arm, Patient Position: Sitting, Cuff Size: Large)   Pulse 81   Resp 16   Ht 5\' 8"  (1.727 m)   Wt 265 lb (120.2 kg)   BMI 40.29 kg/m    Physical Exam  Constitutional: She is oriented to  person, place, and time. She appears well-developed and well-nourished.  HENT:  Head: Normocephalic and atraumatic.  Eyes: Conjunctivae are normal.  Neck: Normal range of motion. Neck supple.  Cardiovascular: Normal rate and regular rhythm.   Pulmonary/Chest: Effort normal and breath sounds normal.  Abdominal: Soft. Bowel sounds are normal.  Lymphadenopathy:    She has no cervical adenopathy.  Neurological: She is alert and oriented to person, place, and time.  Skin: Skin is warm and dry. Capillary refill takes more than 3 seconds.  Psychiatric: She has a normal mood and affect. Her behavior is normal.     Musculoskeletal Exam: C-spine good range of motion some limitation of  range of motion of her lumbar spine. He had good range of motion of her shoulders elbows wrist joint MCPs and PIPs. No synovitis was noted on wrist joints and PIPs. Left total hip replacement and bilateral total knee replacements are doing well. She has some pedal edema but no ankle joint inflammation.  CDAI Exam: CDAI Homunculus Exam:   Tenderness:  Right hand: 2nd MCP and 3rd MCP  Joint Counts:  CDAI Tender Joint count: 2 CDAI Swollen Joint count: 0  Global Assessments:  Patient Global Assessment: 5 Provider Global Assessment: 4  CDAI Calculated Score: 11    Investigation: Findings:  Labs from 11/02/2015 comprehensive metabolic panel was normal CBC was normal    Imaging: No results found.  Speciality Comments: No specialty comments available.    Procedures:  No procedures performed Allergies: Penicillins   Assessment / Plan: Visit Diagnoses: Autoimmune disease ([place]) - +ANA,+RNP currently not active. No synovitis on examination.  Primary osteoarthritis of both knees - LTKR She has chronic discomfort. She is going through physical therapy.  Primary osteoarthritis of both hands She has some stiffness. Joint protection and muscle strengthening discussed.  Primary osteoarthritis of left  hip - LTHR Some chronic discomfort reported.  Bronchitis She reports 3 weeks of dry cough. She had no improvement with cough syrup . She is having difficulty sleeping due to chronic cough. I will obtain chest x-ray PA and lateral today. I will also place her on Z-Pak for the next 3 days . If her symptoms persist she should follow up with her PCP. I have advised her to stop methotrexate for right now. Once her symptoms resolved we will restart methotrexate.  High risk medication use She is on methotrexate 6 tablets per week. Hydroxychloroquine was discontinued due to high cost. Her labs are stable. We will continue to monitor labs every 3 months . We'll refill methotrexate today. I will also give her standing orders. With her next lab I will check UA sedimentation rate C3 and C4, ANA, double-stranded DNA.  Vitamin D deficiency She is on over-the-counter supplement.  Macular  degeneration   No problem-specific Assessment & Plan notes found for this encounter.   Follow-Up Instructions: Return in about 5 months (around 04/29/2016) for Autoimmune disease.  Orders: Orders Placed This Encounter  Procedures  . XR Chest 2 View  . CBC with Differential/Platelet  . COMPLETE METABOLIC PANEL WITH GFR  . C3 and C4  . Sedimentation rate  . Anti-DNA antibody, double-stranded  . Antinuclear Antib (ANA)   Meds ordered this encounter  Medications  . methotrexate 2.5 MG tablet    Sig: Take 6 tablets (15 mg total) by mouth once a week.    Dispense:  72 tablet    Refill:  0  . azithromycin (ZITHROMAX) 500 MG tablet    Sig: Take 1 tablet (500 mg total) by mouth daily.    Dispense:  3 tablet    Refill:  0

## 2015-11-30 ENCOUNTER — Other Ambulatory Visit (INDEPENDENT_AMBULATORY_CARE_PROVIDER_SITE_OTHER): Payer: Self-pay | Admitting: Rheumatology

## 2015-11-30 ENCOUNTER — Ambulatory Visit: Payer: Medicare HMO | Admitting: Physical Therapy

## 2015-11-30 ENCOUNTER — Encounter: Payer: Medicare HMO | Admitting: Physical Therapy

## 2015-11-30 ENCOUNTER — Ambulatory Visit (INDEPENDENT_AMBULATORY_CARE_PROVIDER_SITE_OTHER): Payer: Medicare HMO | Admitting: Rheumatology

## 2015-11-30 ENCOUNTER — Ambulatory Visit (HOSPITAL_COMMUNITY)
Admission: RE | Admit: 2015-11-30 | Discharge: 2015-11-30 | Disposition: A | Discharge status: Home / Self Care | Payer: Medicare HMO | Source: Ambulatory Visit | Attending: Rheumatology | Admitting: Rheumatology

## 2015-11-30 ENCOUNTER — Encounter: Payer: Self-pay | Admitting: Rheumatology

## 2015-11-30 VITALS — BP 136/103 | HR 81 | Resp 16 | Ht 68.0 in | Wt 265.0 lb

## 2015-11-30 DIAGNOSIS — J40 Bronchitis, not specified as acute or chronic: Secondary | ICD-10-CM | POA: Insufficient documentation

## 2015-11-30 DIAGNOSIS — M19042 Primary osteoarthritis, left hand: Secondary | ICD-10-CM | POA: Diagnosis not present

## 2015-11-30 DIAGNOSIS — M359 Systemic involvement of connective tissue, unspecified: Secondary | ICD-10-CM

## 2015-11-30 DIAGNOSIS — R05 Cough: Secondary | ICD-10-CM | POA: Diagnosis not present

## 2015-11-30 DIAGNOSIS — H3530 Unspecified macular degeneration: Secondary | ICD-10-CM | POA: Diagnosis not present

## 2015-11-30 DIAGNOSIS — M1612 Unilateral primary osteoarthritis, left hip: Secondary | ICD-10-CM

## 2015-11-30 DIAGNOSIS — M19041 Primary osteoarthritis, right hand: Secondary | ICD-10-CM | POA: Diagnosis not present

## 2015-11-30 DIAGNOSIS — Z79899 Other long term (current) drug therapy: Secondary | ICD-10-CM

## 2015-11-30 DIAGNOSIS — M170 Bilateral primary osteoarthritis of knee: Secondary | ICD-10-CM

## 2015-11-30 DIAGNOSIS — E559 Vitamin D deficiency, unspecified: Secondary | ICD-10-CM

## 2015-11-30 MED ORDER — METHOTREXATE SODIUM 2.5 MG PO TABS: 15.0000 mg | ORAL_TABLET | ORAL | 0 refills | Status: AC

## 2015-11-30 MED ORDER — AZITHROMYCIN 500 MG PO TABS: 500.0000 mg | ORAL_TABLET | Freq: Every day | ORAL | 0 refills | Status: AC

## 2015-11-30 NOTE — Patient Instructions (Addendum)
Standing Labs We placed an order today for your standing lab work.    Please come back and get your standing labs in December We have open lab Monday through Friday from 8:30-11:30 AM and 1-4 PM at the office of Dr. Tresa Moore, PA.   The office is located at [address], [place], [place], [place] [address] No appointment is necessary.   Labs are drawn by [facility].  You may receive a bill from [facility] for your lab work.    You should stop the methotrexate for now. Once the infection clears up he may restart her methotrexate.

## 2015-11-30 NOTE — Progress Notes (Signed)
CXR normal. Please, notify Pt.

## 2015-12-01 ENCOUNTER — Telehealth: Payer: Self-pay | Admitting: Radiology

## 2015-12-01 NOTE — Telephone Encounter (Signed)
-----   Message from Bo Merino, MD sent at 11/30/2015  3:32 PM EDT ----- CXR normal. Please, notify Pt.

## 2015-12-01 NOTE — Telephone Encounter (Signed)
I have called patient to advise CXR is normal  

## 2015-12-06 ENCOUNTER — Ambulatory Visit (INDEPENDENT_AMBULATORY_CARE_PROVIDER_SITE_OTHER): Payer: Medicare HMO | Admitting: Family Medicine

## 2015-12-06 ENCOUNTER — Encounter: Payer: Self-pay | Admitting: Family Medicine

## 2015-12-06 VITALS — BP 146/88 | HR 94 | Temp 98.0°F | Wt 270.1 lb

## 2015-12-06 DIAGNOSIS — I10 Essential (primary) hypertension: Secondary | ICD-10-CM

## 2015-12-06 DIAGNOSIS — J4521 Mild intermittent asthma with (acute) exacerbation: Secondary | ICD-10-CM | POA: Insufficient documentation

## 2015-12-06 LAB — BASIC METABOLIC PANEL
BUN: 16 mg/dL (ref 6–23)
CO2: 26 mEq/L (ref 19–32)
Calcium: 9.5 mg/dL (ref 8.4–10.5)
Chloride: 108 mEq/L (ref 96–112)
Creatinine, Ser: 0.73 mg/dL (ref 0.40–1.20)
GFR: 100.74 mL/min (ref >60.00)
Glucose, Bld: 84 mg/dL (ref 70–99)
Potassium: 4 mEq/L (ref 3.5–5.1)
Sodium: 143 mEq/L (ref 135–145)

## 2015-12-06 MED ORDER — PREDNISONE 20 MG PO TABS: ORAL_TABLET | ORAL | 1 refills | Status: DC

## 2015-12-06 MED ORDER — HYDROCODONE-HOMATROPINE 5-1.5 MG/5ML PO SYRP: 5.0000 mL | ORAL_SOLUTION | Freq: Three times a day (TID) | ORAL | 0 refills | Status: DC | PRN

## 2015-12-06 NOTE — Progress Notes (Signed)
Theresia is a 70 year old single female nonsmoker who comes in today for evaluation of 2 problems  We saw in September with elevation of blood pressure in the 1 7180 range. We started on Cozaar 50 mg daily. She said she felt lightheaded took her blood pressure after she been on the medicine for couple weeks it was 90. She called EMS they came in was 130/80. She came to the office saw Almyra Free blood pressure was also normal at that time advised to continue the Cozaar 50 mg daily.  We saw a month ago shows slight cough. Exam at that time was normal I gave her some cough syrup to take to use when necessary she subsequently went to see her rheumatologist who gave her a Z-Pak and did a chest x-ray chest x-ray was normal Z-Pak didn't do any good. She had a history of reflux esophagitis and asthma. She takes Protonix on a daily basis for reflux.  Review of systems otherwise negative  Physical exam vital signs stable D blood pressure 146/88  HEENT were negative neck was supple no adenopathy lungs showed symmetrical breath sounds inspiratory and expiratory wheezing mild to moderate in severity  #1 hypertension at goal..... Continue Cozaar check renal function  #2.... Cough secondary to asthma...Marland KitchenMarland Kitchen Prednisone burst and taper

## 2015-12-06 NOTE — Patient Instructions (Signed)
Continue Cozaar 50 mg........... one tablet daily in the morning  Prednisone 20 mg........ 2 tabs 3 days, 1 tab 3 days, half a tab 3 days, then a half a tab Monday Wednesday Friday for a two-week taper  Hydromet......Marland Kitchen 1/2 teaspoon 2-3 times daily when necessary for cough  Return when necessary

## 2015-12-06 NOTE — Progress Notes (Signed)
Pre visit review using our clinic review tool, if applicable. No additional management support is needed unless otherwise documented below in the visit note. 

## 2015-12-07 ENCOUNTER — Other Ambulatory Visit: Payer: Self-pay | Admitting: Internal Medicine

## 2015-12-07 ENCOUNTER — Ambulatory Visit: Payer: Medicare HMO | Admitting: Physical Therapy

## 2015-12-08 DIAGNOSIS — G4733 Obstructive sleep apnea (adult) (pediatric): Secondary | ICD-10-CM | POA: Diagnosis not present

## 2016-01-17 DIAGNOSIS — Z6841 Body Mass Index (BMI) 40.0 and over, adult: Secondary | ICD-10-CM | POA: Diagnosis not present

## 2016-01-17 DIAGNOSIS — M069 Rheumatoid arthritis, unspecified: Secondary | ICD-10-CM | POA: Diagnosis not present

## 2016-01-17 DIAGNOSIS — K219 Gastro-esophageal reflux disease without esophagitis: Secondary | ICD-10-CM | POA: Diagnosis not present

## 2016-01-17 DIAGNOSIS — Z0000 Encounter for general adult medical examination without abnormal findings: Secondary | ICD-10-CM | POA: Diagnosis not present

## 2016-01-17 DIAGNOSIS — I10 Essential (primary) hypertension: Secondary | ICD-10-CM | POA: Diagnosis not present

## 2016-01-17 DIAGNOSIS — R69 Illness, unspecified: Secondary | ICD-10-CM | POA: Diagnosis not present

## 2016-01-19 DIAGNOSIS — R69 Illness, unspecified: Secondary | ICD-10-CM | POA: Diagnosis not present

## 2016-02-29 ENCOUNTER — Other Ambulatory Visit: Payer: Self-pay | Admitting: *Deleted

## 2016-02-29 DIAGNOSIS — M359 Systemic involvement of connective tissue, unspecified: Secondary | ICD-10-CM

## 2016-02-29 DIAGNOSIS — Z79899 Other long term (current) drug therapy: Secondary | ICD-10-CM

## 2016-02-29 DIAGNOSIS — Z1231 Encounter for screening mammogram for malignant neoplasm of breast: Secondary | ICD-10-CM | POA: Diagnosis not present

## 2016-02-29 LAB — CBC WITH DIFFERENTIAL/PLATELET
BASOS PCT: 1 %
Basophils Absolute: 45 cells/uL (ref 0–200)
EOS ABS: 225 {cells}/uL (ref 15–500)
EOS PCT: 5 %
HCT: 38.9 % (ref 35.0–45.0)
Hemoglobin: 12.9 g/dL (ref 11.7–15.5)
LYMPHS PCT: 29 %
Lymphs Abs: 1305 cells/uL (ref 850–3900)
MCH: 29.3 pg (ref 27.0–33.0)
MCHC: 33.2 g/dL (ref 32.0–36.0)
MCV: 88.4 fL (ref 80.0–100.0)
MONOS PCT: 9 %
MPV: 10.1 fL (ref 7.5–12.5)
Monocytes Absolute: 405 cells/uL (ref 200–950)
NEUTROS ABS: 2520 {cells}/uL (ref 1500–7800)
Neutrophils Relative %: 56 %
PLATELETS: 317 10*3/uL (ref 140–400)
RBC: 4.4 MIL/uL (ref 3.80–5.10)
RDW: 13.6 % (ref 11.0–15.0)
WBC: 4.5 10*3/uL (ref 3.8–10.8)

## 2016-02-29 LAB — HM MAMMOGRAPHY

## 2016-02-29 LAB — COMPLETE METABOLIC PANEL WITH GFR
ALT: 10 U/L (ref 6–29)
AST: 14 U/L (ref 10–35)
Albumin: 4 g/dL (ref 3.6–5.1)
Alkaline Phosphatase: 115 U/L (ref 33–130)
BILIRUBIN TOTAL: 0.8 mg/dL (ref 0.2–1.2)
BUN: 16 mg/dL (ref 7–25)
CHLORIDE: 107 mmol/L (ref 98–110)
CO2: 25 mmol/L (ref 20–31)
CREATININE: 0.83 mg/dL (ref 0.60–0.93)
Calcium: 9.7 mg/dL (ref 8.6–10.4)
GFR, EST AFRICAN AMERICAN: 81 mL/min (ref >=60)
GFR, Est Non African American: 71 mL/min (ref >=60)
GLUCOSE: 93 mg/dL (ref 65–99)
Potassium: 4.2 mmol/L (ref 3.5–5.3)
SODIUM: 142 mmol/L (ref 135–146)
TOTAL PROTEIN: 7.2 g/dL (ref 6.1–8.1)

## 2016-03-01 LAB — ANTI-NUCLEAR AB-TITER (ANA TITER)

## 2016-03-01 LAB — ANA: ANA: POSITIVE — AB

## 2016-03-01 LAB — SEDIMENTATION RATE: SED RATE: 6 mm/h (ref 0–30)

## 2016-03-01 LAB — ANTI-DNA ANTIBODY, DOUBLE-STRANDED: ds DNA Ab: <1 IU/mL

## 2016-03-02 LAB — C3 AND C4
C3 Complement: 146 mg/dL (ref 90–180)
C4 COMPLEMENT: 32 mg/dL (ref 16–47)

## 2016-03-05 ENCOUNTER — Encounter: Payer: Self-pay | Admitting: Family Medicine

## 2016-03-05 NOTE — Progress Notes (Signed)
Labs stable

## 2016-03-15 DIAGNOSIS — H401132 Primary open-angle glaucoma, bilateral, moderate stage: Secondary | ICD-10-CM | POA: Diagnosis not present

## 2016-03-15 DIAGNOSIS — H04123 Dry eye syndrome of bilateral lacrimal glands: Secondary | ICD-10-CM | POA: Diagnosis not present

## 2016-03-22 DIAGNOSIS — G4733 Obstructive sleep apnea (adult) (pediatric): Secondary | ICD-10-CM | POA: Diagnosis not present

## 2016-04-10 ENCOUNTER — Other Ambulatory Visit (INDEPENDENT_AMBULATORY_CARE_PROVIDER_SITE_OTHER): Payer: Self-pay | Admitting: Rheumatology

## 2016-04-10 NOTE — Telephone Encounter (Signed)
ok 

## 2016-04-10 NOTE — Telephone Encounter (Signed)
Last Visit: 11/30/15 Next Visit: 05/02/16  Okay to refill Folic Acid?

## 2016-04-17 ENCOUNTER — Other Ambulatory Visit: Payer: Self-pay | Admitting: Family Medicine

## 2016-04-17 ENCOUNTER — Telehealth: Payer: Self-pay | Admitting: Family Medicine

## 2016-04-17 NOTE — Telephone Encounter (Signed)
Duplicate request.  Called and spoke to the pharmacy today. See telephone note.

## 2016-04-17 NOTE — Telephone Encounter (Signed)
Reviewed chart.  Pt should have refills on file.  Called and spoke to the pharmacy.  She does have refills on file.  Pharmacy to get ready for pt pick up.

## 2016-04-17 NOTE — Telephone Encounter (Signed)
° ° °  Pt request refill of the following:   furosemide (LASIX) 20 MG tablet  Phamacy:

## 2016-05-01 DIAGNOSIS — M1612 Unilateral primary osteoarthritis, left hip: Secondary | ICD-10-CM | POA: Insufficient documentation

## 2016-05-01 DIAGNOSIS — J40 Bronchitis, not specified as acute or chronic: Secondary | ICD-10-CM | POA: Insufficient documentation

## 2016-05-01 NOTE — Progress Notes (Signed)
Office Visit Note  Patient: Taylor Mccall             Date of Birth: November 07, 1945           MRN: [id number]             PCP: Joycelyn Man, MD Referring: Dorena Cookey, MD Visit Date: 05/02/2016 Occupation: _0 @    Subjective:  Follow-up   History of Present Illness: Taylor Mccall is a 71 y.o. female  Last seen 11/30/2015  Patient is doing well with the autoimmune disease. No joint pain swelling and stiffness in the joints.  She is on methotrexate 6 per week and doing well with that except she does have some hair loss and she is very worried about that. She also was taking Plaquenil at one time but stopped it last year when the refill cost was over $200 at [facility]. She did not inform us of her struggles with paying for it and I gave her other options to make it more affordable. Namely cause [place] 180 pills for $85 with good [email or web address].  Activities of Daily Living:  Patient reports morning stiffness for 15 minutes.   Patient Denies nocturnal pain.  Difficulty dressing/grooming: Denies Difficulty climbing stairs: Denies Difficulty getting out of chair: Denies Difficulty using hands for taps, buttons, cutlery, and/or writing: Denies   Review of Systems  Constitutional: Negative for fatigue.  HENT: Positive for mouth sores (1 oral ulcer). Negative for mouth dryness.   Eyes: Negative for dryness.  Respiratory: Negative for shortness of breath.   Gastrointestinal: Negative for constipation and diarrhea.  Musculoskeletal: Negative for myalgias and myalgias.  Skin: Negative for sensitivity to sunlight.  Psychiatric/Behavioral: Negative for decreased concentration and sleep disturbance.    PMFS History:  Patient Active Problem List   Diagnosis Date Noted  . Primary osteoarthritis of left hip 05/01/2016  . Bronchitis 05/01/2016  . Mild intermittent asthma with acute exacerbation 12/06/2015  . Autoimmune disease ([place]) 11/29/2015  . Primary  osteoarthritis of both knees 11/29/2015  . High risk medication use 11/29/2015  . Vitamin D deficiency 11/29/2015  . Macular degeneration 11/29/2015  . Bilateral lower extremity edema 10/26/2015  . Total knee replacement status 12/09/2013  . Breast mass, left 01/18/2011  . Obesity 12/08/2009  . Venous (peripheral) insufficiency 07/28/2009  . GALACTORRHEA 07/28/2009  . PAIN IN JOINT, ANKLE AND FOOT 01/12/2008  . Obstructive sleep apnea 08/29/2007  . GAIT DISTURBANCE 08/14/2007  . Depression 08/26/2006  . Essential hypertension 08/26/2006  . Primary osteoarthritis of both hands 08/26/2006  . Urinary incontinence 08/26/2006    Past Medical History:  Diagnosis Date  . Anxiety   . Breast mass, left   . Depression   . Gait disturbance   . Hypertension   . Obesity   . Osteoarthritis   . Rheumatoid arthritis(714.0)   . Sleep apnea    cpap  . Urinary incontinence   . Venous insufficiency     Family History  Problem Relation Age of Onset  . Diabetes    . Stroke    . Asthma Brother   . Coronary artery disease Father   . Skin cancer Father   . Coronary artery disease Brother    Past Surgical History:  Procedure Laterality Date  . ABDOMINAL HYSTERECTOMY    . BLADDER SURGERY     sling  . BLADDER SUSPENSION  2009  . BREAST REDUCTION SURGERY    . COLONOSCOPY    . ESOPHAGOGASTRODUODENOSCOPY    .  EYE SURGERY    . REPLACEMENT TOTAL KNEE Left   . TOE SURGERY Left    Left great toe  . TOTAL HIP ARTHROPLASTY    . TOTAL KNEE ARTHROPLASTY Right 12/09/2013   Procedure: Right Total Knee Arthroplasty;  Surgeon: Newt Minion, MD;  Location: [facility];  Service: Orthopedics;  Laterality: Right;  Marland Kitchen VESICOVAGINAL FISTULA CLOSURE W/ TAH     Social History   Social History Narrative  . No narrative on file     Objective: Vital Signs: BP 131/74   Pulse 76   Resp 12   Ht _0  (1.778 m)   Wt 261 lb (118.4 kg)   BMI 37.45 kg/m    Physical Exam  Constitutional: She is oriented to  person, place, and time. She appears well-developed and well-nourished.  HENT:  Head: Normocephalic and atraumatic.  Eyes: EOM are normal. Pupils are equal, round, and reactive to light.  Cardiovascular: Normal rate, regular rhythm and normal heart sounds.  Exam reveals no gallop and no friction rub.   No murmur heard. Pulmonary/Chest: Effort normal and breath sounds normal. She has no wheezes. She has no rales.  Abdominal: Soft. Bowel sounds are normal. She exhibits no distension. There is no tenderness. There is no guarding. No hernia.  Musculoskeletal: Normal range of motion. She exhibits no edema, tenderness or deformity.  Lymphadenopathy:    She has no cervical adenopathy.  Neurological: She is alert and oriented to person, place, and time. Coordination normal.  Skin: Skin is warm and dry. Capillary refill takes less than 2 seconds. No rash noted.  Psychiatric: She has a normal mood and affect. Her behavior is normal.  Nursing note and vitals reviewed.    Musculoskeletal Exam:  Full range of motion of all joints Grip strength is equal and strong bilaterally Fiber myalgia tender points are all absent  CDAI Exam: CDAI Homunculus Exam:   Joint Counts:  CDAI Tender Joint count: 0 CDAI Swollen Joint count: 0  Global Assessments:  Patient Global Assessment: 2 Provider Global Assessment: 2  CDAI Calculated Score: 4  No synovitis on examination  Investigation: Findings:  Labs from 11/02/2015 comprehensive metabolic panel was normal CBC was normal  Abstract on 03/05/2016  Component Date Value Ref Range Status  . HM Mammogram 02/29/2016 0-4 Bi-Rad  0-4 Bi-Rad, Self Reported Normal Final  Orders Only on 02/29/2016  Component Date Value Ref Range Status  . WBC 02/29/2016 4.5  3.8 - 10.8 K/uL Final  . RBC 02/29/2016 4.40  3.80 - 5.10 MIL/uL Final  . Hemoglobin 02/29/2016 12.9  11.7 - 15.5 g/dL Final  . HCT 02/29/2016 38.9  35.0 - 45.0 % Final  . MCV 02/29/2016 88.4  80.0 -  100.0 fL Final  . MCH 02/29/2016 29.3  27.0 - 33.0 pg Final  . MCHC 02/29/2016 33.2  32.0 - 36.0 g/dL Final  . RDW 02/29/2016 13.6  11.0 - 15.0 % Final  . Platelets 02/29/2016 317  140 - 400 K/uL Final  . MPV 02/29/2016 10.1  7.5 - 12.5 fL Final  . Neutro Abs 02/29/2016 2520  1,500 - 7,800 cells/uL Final  . Lymphs Abs 02/29/2016 1305  850 - 3,900 cells/uL Final  . Monocytes Absolute 02/29/2016 405  200 - 950 cells/uL Final  . Eosinophils Absolute 02/29/2016 225  15 - 500 cells/uL Final  . Basophils Absolute 02/29/2016 45  0 - 200 cells/uL Final  . Neutrophils Relative % 02/29/2016 56  % Final  . Lymphocytes Relative  02/29/2016 29  % Final  . Monocytes Relative 02/29/2016 9  % Final  . Eosinophils Relative 02/29/2016 5  % Final  . Basophils Relative 02/29/2016 1  % Final  . Smear Review 02/29/2016 Criteria for review not met   Final  . Sodium 02/29/2016 142  135 - 146 mmol/L Final  . Potassium 02/29/2016 4.2  3.5 - 5.3 mmol/L Final  . Chloride 02/29/2016 107  98 - 110 mmol/L Final  . CO2 02/29/2016 25  20 - 31 mmol/L Final  . Glucose, Bld 02/29/2016 93  65 - 99 mg/dL Final  . BUN 02/29/2016 16  7 - 25 mg/dL Final  . Creat 02/29/2016 0.83  0.60 - 0.93 mg/dL Final   Comment:   For patients > or = 73 years of age: The upper reference limit for Creatinine is approximately 13% higher for people identified as African-American.     . Total Bilirubin 02/29/2016 0.8  0.2 - 1.2 mg/dL Final  . Alkaline Phosphatase 02/29/2016 115  33 - 130 U/L Final  . AST 02/29/2016 14  10 - 35 U/L Final  . ALT 02/29/2016 10  6 - 29 U/L Final  . Total Protein 02/29/2016 7.2  6.1 - 8.1 g/dL Final  . Albumin 02/29/2016 4.0  3.6 - 5.1 g/dL Final  . Calcium 02/29/2016 9.7  8.6 - 10.4 mg/dL Final  . GFR, Est African American 02/29/2016 81  >=60 mL/min Final  . GFR, Est Non African American 02/29/2016 71  >=60 mL/min Final  . C3 Complement 02/29/2016 146  90 - 180 mg/dL Final  . C4 Complement 02/29/2016 32  16  - 47 mg/dL Final  . Sed Rate 02/29/2016 6  0 - 30 mm/hr Final  . ds DNA Ab 02/29/2016 <1  IU/mL Final   Comment:                                 IU/mL       Interpretation                               < or = 4    Negative                               5-9         Indeterminate                               > or = 10   Positive     . Anit Nuclear Antibody(ANA) 02/29/2016 POS* NEGATIVE Final  . ANA Pattern 1 02/29/2016 SPECKLED*  Final   Comment: Although the specimen was positive for antinuclear antibodies (ANA), the presence of cytoplasmic fluorescence was noted on the HEp-2 slide. Other reactivities (e.g., anti-mitochondrial antibodies or anti-smooth muscle antibodies) may be responsible for this fluorescence.   . ANA Titer 1 02/29/2016 1:40* titer Final   Comment:           Reference Range           < 1:40      Negative             1:40-1:80 Low Antibody level           > 1:80  Elevated Antibody level   Office Visit on 12/06/2015  Component Date Value Ref Range Status  . Sodium 12/06/2015 143  135 - 145 mEq/L Final  . Potassium 12/06/2015 4.0  3.5 - 5.1 mEq/L Final  . Chloride 12/06/2015 108  96 - 112 mEq/L Final  . CO2 12/06/2015 26  19 - 32 mEq/L Final  . Glucose, Bld 12/06/2015 84  70 - 99 mg/dL Final  . BUN 12/06/2015 16  6 - 23 mg/dL Final  . Creatinine, Ser 12/06/2015 0.73  0.40 - 1.20 mg/dL Final  . Calcium 12/06/2015 9.5  8.4 - 10.5 mg/dL Final  . GFR 12/06/2015 100.74  >60.00 mL/min Final      Imaging: No results found.  Speciality Comments: No specialty comments available.    Procedures:  No procedures performed Allergies: Penicillins   Assessment / Plan:     Visit Diagnoses: Autoimmune disease ([place]) - +ANA,+RNP currently not active. No synovitis on examination. - Plan: CBC with Differential/Platelet, COMPLETE METABOLIC PANEL WITH GFR, Urinalysis, Routine w reflex microscopic  High risk medication use - ?MTX- 6 tablets per weekPLQ-253m by mouth 2  (two) times daily; (stopped due to expense)--> restarted today - Plan: CBC with Differential/Platelet, COMPLETE METABOLIC PANEL WITH GFR, Urinalysis, Routine w reflex microscopic  Primary osteoarthritis of both knees - L TKR  Primary osteoarthritis of both hands  Primary osteoarthritis of left hip - Left THR  Bronchitis - 05/02/2016: Doing well now.  Vitamin D deficiency  Macular degeneration  : #1: Autoimmune disease. Doing well. No joint pain stiffness and swelling.; 1 oral ulcers to the roof of her mouth only. No lymphadenopathy, no new rash, no additional fatigue. Otherwise doing well. Taking methotrexate 6 per week Taking Plaquenil 200 mg twice a day but has been out of the medication for the last several months due to the extensiveness of the medication. She did not tell her she was out of the medication. We discussed strategies to reduce her cost for this medication.  #2: High risk prescription. Methotrexate 6 per week Folic acid 2 per day Plaquenil has been discontinued temporarily and we will restarted today. She has a Plaquenil eye exam scheduled for August 2018. The last Plaquenil eye exam was August 2017 at Dr. [facility]Payton [facility] and was normal.  #3:, She had bronchitis and she has recovered from that well.  #4: History of peripheral edema. Currently wearing compression stockings. We discussed strategies to minimize the peripheral edema.  #5: We discussed strategies to minimize the autoimmune disease risks by avoiding sun exposure, wearing sunscreen, etc.  #6: History of left total knee replacement doing well. Occasional right knee pain  #7: History of left total hip replacement. Doing well.  #8: Osteoarthritis of bilateral hands. Some discomfort.  #9: Note patient is worried about hair loss from methotrexate. We will restart the Plaquenil and see how well she is doing. On the next visit I am hopeful that we can decrease her methotrexate to 5 pills per week or even 4  pills per week depending on how well she is doing. Currently she is on 6 pills per week which is causing the side effect. Note that her autoimmune diseases positive for ANA and RNP and no synovitis on examination at the October 2017 visit with Dr. DEstanislado Pandywith my visit today   Orders: Orders Placed This Encounter  Procedures  . CBC with Differential/Platelet  . COMPLETE METABOLIC PANEL WITH GFR  . Urinalysis, Routine w reflex microscopic   Meds ordered this  encounter  Medications  . methotrexate 2.5 MG tablet    Sig: Take 6 tablets (15 mg total) by mouth once a week.    Dispense:  72 tablet    Refill:  0  . hydroxychloroquine (PLAQUENIL) 200 MG tablet    Sig: Take 1 tablet (200 mg total) by mouth 2 (two) times daily.    Dispense:  180 tablet    Refill:  0    Face-to-face time spent with patient was 30 minutes. 50% of time was spent in counseling and coordination of care.  Follow-Up Instructions: No Follow-up on file.   Eliezer Lofts, PA-C  Note - This record has been created using [employer].  Chart creation errors have been sought, but may not always  have been located. Such creation errors do not reflect on  the standard of medical care.

## 2016-05-02 ENCOUNTER — Ambulatory Visit (INDEPENDENT_AMBULATORY_CARE_PROVIDER_SITE_OTHER): Payer: Medicare HMO | Admitting: Rheumatology

## 2016-05-02 ENCOUNTER — Encounter: Payer: Self-pay | Admitting: Rheumatology

## 2016-05-02 VITALS — BP 131/74 | HR 76 | Resp 12 | Ht 70.0 in | Wt 261.0 lb

## 2016-05-02 DIAGNOSIS — E559 Vitamin D deficiency, unspecified: Secondary | ICD-10-CM | POA: Diagnosis not present

## 2016-05-02 DIAGNOSIS — M170 Bilateral primary osteoarthritis of knee: Secondary | ICD-10-CM

## 2016-05-02 DIAGNOSIS — H3530 Unspecified macular degeneration: Secondary | ICD-10-CM

## 2016-05-02 DIAGNOSIS — M359 Systemic involvement of connective tissue, unspecified: Secondary | ICD-10-CM | POA: Diagnosis not present

## 2016-05-02 DIAGNOSIS — M1612 Unilateral primary osteoarthritis, left hip: Secondary | ICD-10-CM

## 2016-05-02 DIAGNOSIS — M19041 Primary osteoarthritis, right hand: Secondary | ICD-10-CM

## 2016-05-02 DIAGNOSIS — Z79899 Other long term (current) drug therapy: Secondary | ICD-10-CM | POA: Diagnosis not present

## 2016-05-02 DIAGNOSIS — M19042 Primary osteoarthritis, left hand: Secondary | ICD-10-CM | POA: Diagnosis not present

## 2016-05-02 DIAGNOSIS — J40 Bronchitis, not specified as acute or chronic: Secondary | ICD-10-CM | POA: Diagnosis not present

## 2016-05-02 MED ORDER — HYDROXYCHLOROQUINE SULFATE 200 MG PO TABS: 200.0000 mg | ORAL_TABLET | Freq: Two times a day (BID) | ORAL | 0 refills | Status: DC

## 2016-05-02 MED ORDER — METHOTREXATE SODIUM 2.5 MG PO TABS: 15.0000 mg | ORAL_TABLET | ORAL | 0 refills | Status: DC

## 2016-05-03 LAB — COMPLETE METABOLIC PANEL WITH GFR
ALT: 12 U/L (ref 6–29)
AST: 16 U/L (ref 10–35)
Albumin: 4.2 g/dL (ref 3.6–5.1)
Alkaline Phosphatase: 109 U/L (ref 33–130)
BILIRUBIN TOTAL: 0.8 mg/dL (ref 0.2–1.2)
BUN: 17 mg/dL (ref 7–25)
CALCIUM: 9.5 mg/dL (ref 8.6–10.4)
CHLORIDE: 105 mmol/L (ref 98–110)
CO2: 26 mmol/L (ref 20–31)
CREATININE: 1.02 mg/dL — AB (ref 0.60–0.93)
GFR, Est African American: 64 mL/min (ref >=60)
GFR, Est Non African American: 55 mL/min — ABNORMAL LOW (ref >=60)
Glucose, Bld: 91 mg/dL (ref 65–99)
POTASSIUM: 4 mmol/L (ref 3.5–5.3)
Sodium: 141 mmol/L (ref 135–146)
Total Protein: 7.4 g/dL (ref 6.1–8.1)

## 2016-05-03 LAB — CBC WITH DIFFERENTIAL/PLATELET
BASOS PCT: 1 %
Basophils Absolute: 64 cells/uL (ref 0–200)
EOS PCT: 4 %
Eosinophils Absolute: 256 cells/uL (ref 15–500)
HCT: 41.2 % (ref 35.0–45.0)
Hemoglobin: 13.6 g/dL (ref 11.7–15.5)
LYMPHS PCT: 24 %
Lymphs Abs: 1536 cells/uL (ref 850–3900)
MCH: 30 pg (ref 27.0–33.0)
MCHC: 33 g/dL (ref 32.0–36.0)
MCV: 90.9 fL (ref 80.0–100.0)
MONO ABS: 448 {cells}/uL (ref 200–950)
MONOS PCT: 7 %
MPV: 10.7 fL (ref 7.5–12.5)
NEUTROS PCT: 64 %
Neutro Abs: 4096 cells/uL (ref 1500–7800)
PLATELETS: 307 10*3/uL (ref 140–400)
RBC: 4.53 MIL/uL (ref 3.80–5.10)
RDW: 13.9 % (ref 11.0–15.0)
WBC: 6.4 10*3/uL (ref 3.8–10.8)

## 2016-05-03 LAB — URINALYSIS, ROUTINE W REFLEX MICROSCOPIC
Bilirubin Urine: NEGATIVE
Glucose, UA: NEGATIVE
HGB URINE DIPSTICK: NEGATIVE
KETONES UR: NEGATIVE
NITRITE: NEGATIVE
Protein, ur: NEGATIVE
SPECIFIC GRAVITY, URINE: 1.025 (ref 1.001–1.035)
pH: 6 (ref 5.0–8.0)

## 2016-05-03 LAB — URINALYSIS, MICROSCOPIC ONLY
Bacteria, UA: NONE SEEN [HPF]
Casts: NONE SEEN [LPF]
Crystals: NONE SEEN [HPF]
Yeast: NONE SEEN [HPF]

## 2016-06-29 DIAGNOSIS — G4733 Obstructive sleep apnea (adult) (pediatric): Secondary | ICD-10-CM | POA: Diagnosis not present

## 2016-07-25 DIAGNOSIS — E876 Hypokalemia: Secondary | ICD-10-CM | POA: Diagnosis not present

## 2016-07-25 DIAGNOSIS — R69 Illness, unspecified: Secondary | ICD-10-CM | POA: Diagnosis not present

## 2016-07-25 DIAGNOSIS — Z96653 Presence of artificial knee joint, bilateral: Secondary | ICD-10-CM | POA: Diagnosis not present

## 2016-07-25 DIAGNOSIS — R32 Unspecified urinary incontinence: Secondary | ICD-10-CM | POA: Diagnosis not present

## 2016-07-25 DIAGNOSIS — M130 Polyarthritis, unspecified: Secondary | ICD-10-CM | POA: Diagnosis not present

## 2016-07-25 DIAGNOSIS — Z79899 Other long term (current) drug therapy: Secondary | ICD-10-CM | POA: Diagnosis not present

## 2016-07-25 DIAGNOSIS — H16201 Unspecified keratoconjunctivitis, right eye: Secondary | ICD-10-CM | POA: Diagnosis not present

## 2016-07-25 DIAGNOSIS — R292 Abnormal reflex: Secondary | ICD-10-CM | POA: Diagnosis not present

## 2016-07-25 DIAGNOSIS — G4730 Sleep apnea, unspecified: Secondary | ICD-10-CM | POA: Diagnosis not present

## 2016-07-25 DIAGNOSIS — R600 Localized edema: Secondary | ICD-10-CM | POA: Diagnosis not present

## 2016-07-25 DIAGNOSIS — I10 Essential (primary) hypertension: Secondary | ICD-10-CM | POA: Diagnosis not present

## 2016-07-25 DIAGNOSIS — I739 Peripheral vascular disease, unspecified: Secondary | ICD-10-CM | POA: Diagnosis not present

## 2016-07-25 DIAGNOSIS — Z961 Presence of intraocular lens: Secondary | ICD-10-CM | POA: Diagnosis not present

## 2016-07-25 DIAGNOSIS — Z6839 Body mass index (BMI) 39.0-39.9, adult: Secondary | ICD-10-CM | POA: Diagnosis not present

## 2016-07-25 DIAGNOSIS — N3945 Continuous leakage: Secondary | ICD-10-CM | POA: Diagnosis not present

## 2016-07-25 DIAGNOSIS — Z9989 Dependence on other enabling machines and devices: Secondary | ICD-10-CM | POA: Diagnosis not present

## 2016-07-25 DIAGNOSIS — Z7982 Long term (current) use of aspirin: Secondary | ICD-10-CM | POA: Diagnosis not present

## 2016-07-25 DIAGNOSIS — Z0000 Encounter for general adult medical examination without abnormal findings: Secondary | ICD-10-CM | POA: Diagnosis not present

## 2016-07-25 DIAGNOSIS — H409 Unspecified glaucoma: Secondary | ICD-10-CM | POA: Diagnosis not present

## 2016-07-25 DIAGNOSIS — Z86008 Personal history of in-situ neoplasm of other site: Secondary | ICD-10-CM | POA: Diagnosis not present

## 2016-07-25 DIAGNOSIS — H353290 Exudative age-related macular degeneration, unspecified eye, stage unspecified: Secondary | ICD-10-CM | POA: Diagnosis not present

## 2016-07-26 DIAGNOSIS — R69 Illness, unspecified: Secondary | ICD-10-CM | POA: Diagnosis not present

## 2016-07-30 ENCOUNTER — Telehealth: Payer: Self-pay | Admitting: Internal Medicine

## 2016-07-30 ENCOUNTER — Other Ambulatory Visit: Payer: Self-pay | Admitting: Rheumatology

## 2016-07-30 NOTE — Telephone Encounter (Signed)
Ok. Repeat labs

## 2016-07-30 NOTE — Telephone Encounter (Signed)
Patient advised that she is not due until 06/2018 unless she has a change in bowel habits, rectal bleeding, or new GI symptoms.  She denies any of these.  She is advised that if that changes she should contact the office and make an appt.

## 2016-07-30 NOTE — Telephone Encounter (Signed)
Last Visit: 05/02/16 Next visit: 09/04/16 Labs: 05/02/16 Creat 1.02 GFR 55 Previous Creat 0.83 GFR 71  Meds: PLQ, MTX, Protonix, Hycodan, Cozaar, Lasix, K-Dur, Zoloft, ASA,    Okay to refill MTX?

## 2016-08-15 ENCOUNTER — Ambulatory Visit (INDEPENDENT_AMBULATORY_CARE_PROVIDER_SITE_OTHER): Payer: Medicare HMO | Admitting: Rheumatology

## 2016-08-15 ENCOUNTER — Encounter: Payer: Self-pay | Admitting: Rheumatology

## 2016-08-15 VITALS — BP 130/72 | HR 76 | Resp 14 | Ht 70.0 in | Wt 256.0 lb

## 2016-08-15 DIAGNOSIS — M19042 Primary osteoarthritis, left hand: Secondary | ICD-10-CM | POA: Diagnosis not present

## 2016-08-15 DIAGNOSIS — Z79899 Other long term (current) drug therapy: Secondary | ICD-10-CM

## 2016-08-15 DIAGNOSIS — L853 Xerosis cutis: Secondary | ICD-10-CM | POA: Diagnosis not present

## 2016-08-15 DIAGNOSIS — E559 Vitamin D deficiency, unspecified: Secondary | ICD-10-CM | POA: Diagnosis not present

## 2016-08-15 DIAGNOSIS — R631 Polydipsia: Secondary | ICD-10-CM

## 2016-08-15 DIAGNOSIS — M170 Bilateral primary osteoarthritis of knee: Secondary | ICD-10-CM

## 2016-08-15 DIAGNOSIS — M19041 Primary osteoarthritis, right hand: Secondary | ICD-10-CM | POA: Diagnosis not present

## 2016-08-15 DIAGNOSIS — D8989 Other specified disorders involving the immune mechanism, not elsewhere classified: Secondary | ICD-10-CM | POA: Diagnosis not present

## 2016-08-15 DIAGNOSIS — R5383 Other fatigue: Secondary | ICD-10-CM | POA: Diagnosis not present

## 2016-08-15 DIAGNOSIS — M1612 Unilateral primary osteoarthritis, left hip: Secondary | ICD-10-CM | POA: Diagnosis not present

## 2016-08-15 DIAGNOSIS — R6889 Other general symptoms and signs: Secondary | ICD-10-CM | POA: Diagnosis not present

## 2016-08-15 DIAGNOSIS — Z780 Asymptomatic menopausal state: Secondary | ICD-10-CM | POA: Diagnosis not present

## 2016-08-15 DIAGNOSIS — M359 Systemic involvement of connective tissue, unspecified: Secondary | ICD-10-CM

## 2016-08-15 LAB — CBC WITH DIFFERENTIAL/PLATELET
BASOS ABS: 46 {cells}/uL (ref 0–200)
Basophils Relative: 1 %
EOS ABS: 184 {cells}/uL (ref 15–500)
Eosinophils Relative: 4 %
HCT: 37.2 % (ref 35.0–45.0)
Hemoglobin: 12.4 g/dL (ref 11.7–15.5)
LYMPHS PCT: 26 %
Lymphs Abs: 1196 cells/uL (ref 850–3900)
MCH: 30.5 pg (ref 27.0–33.0)
MCHC: 33.3 g/dL (ref 32.0–36.0)
MCV: 91.6 fL (ref 80.0–100.0)
MONOS PCT: 10 %
MPV: 10.3 fL (ref 7.5–12.5)
Monocytes Absolute: 460 cells/uL (ref 200–950)
Neutro Abs: 2714 cells/uL (ref 1500–7800)
Neutrophils Relative %: 59 %
PLATELETS: 295 10*3/uL (ref 140–400)
RBC: 4.06 MIL/uL (ref 3.80–5.10)
RDW: 13.6 % (ref 11.0–15.0)
WBC: 4.6 10*3/uL (ref 3.8–10.8)

## 2016-08-15 NOTE — Progress Notes (Signed)
Office Visit Note  Patient: Taylor Mccall             Date of Birth: May 23, 1945           MRN: [id number]             PCP: Dorena Cookey, MD Referring: Dorena Cookey, MD Visit Date: 08/15/2016 Occupation: '@GUAROCC'$ @    Subjective:  Medication Management  History of Present Illness: Taylor Mccall is a 71 y.o. female  Last seen 05/02/2016 for autoimmune disease (+ANA,+RNP ), medication management of her high risk prescription which is methotrexate 6 per week; plq '200mg'$  bid (stopped due to expense --> but later restarted).    Activities of Daily Living:  Patient reports morning stiffness for 30 minutes.   Patient Reports nocturnal pain.  Difficulty dressing/grooming: Denies Difficulty climbing stairs: Reports Difficulty getting out of chair: Reports Difficulty using hands for taps, buttons, cutlery, and/or writing: Reports   Review of Systems  Constitutional: Negative for fatigue.  HENT: Negative for mouth sores and mouth dryness.   Eyes: Negative for dryness.  Respiratory: Negative for shortness of breath.   Gastrointestinal: Negative for constipation and diarrhea.  Musculoskeletal: Negative for myalgias and myalgias.  Skin: Negative for sensitivity to sunlight.  Psychiatric/Behavioral: Negative for decreased concentration and sleep disturbance.    PMFS History:  Patient Active Problem List   Diagnosis Date Noted  . Primary osteoarthritis of left hip 05/01/2016  . Bronchitis 05/01/2016  . Mild intermittent asthma with acute exacerbation 12/06/2015  . Autoimmune disease ([place]) 11/29/2015  . Primary osteoarthritis of both knees 11/29/2015  . High risk medication use 11/29/2015  . Vitamin D deficiency 11/29/2015  . Macular degeneration 11/29/2015  . Bilateral lower extremity edema 10/26/2015  . Total knee replacement status 12/09/2013  . Breast mass, left 01/18/2011  . Obesity 12/08/2009  . Venous (peripheral) insufficiency 07/28/2009  . GALACTORRHEA  07/28/2009  . PAIN IN JOINT, ANKLE AND FOOT 01/12/2008  . Obstructive sleep apnea 08/29/2007  . GAIT DISTURBANCE 08/14/2007  . Depression 08/26/2006  . Essential hypertension 08/26/2006  . Primary osteoarthritis of both hands 08/26/2006  . Urinary incontinence 08/26/2006    Past Medical History:  Diagnosis Date  . Anxiety   . Breast mass, left   . Depression   . Gait disturbance   . Hypertension   . Obesity   . Osteoarthritis   . Rheumatoid arthritis(714.0)   . Sleep apnea    cpap  . Urinary incontinence   . Venous insufficiency     Family History  Problem Relation Age of Onset  . Diabetes Unknown   . Stroke Unknown   . Asthma Brother   . Coronary artery disease Father   . Skin cancer Father   . Coronary artery disease Brother    Past Surgical History:  Procedure Laterality Date  . ABDOMINAL HYSTERECTOMY    . BLADDER SURGERY     sling  . BLADDER SUSPENSION  2009  . BREAST REDUCTION SURGERY    . COLONOSCOPY    . ESOPHAGOGASTRODUODENOSCOPY    . EYE SURGERY    . REPLACEMENT TOTAL KNEE Left   . TOE SURGERY Left    Left great toe  . TOTAL HIP ARTHROPLASTY    . TOTAL KNEE ARTHROPLASTY Right 12/09/2013   Procedure: Right Total Knee Arthroplasty;  Surgeon: Newt Minion, MD;  Location: [facility];  Service: Orthopedics;  Laterality: Right;  Marland Kitchen VESICOVAGINAL FISTULA CLOSURE W/ TAH     Social History  Social History Narrative  . No narrative on file     Objective: Vital Signs: BP 130/72   Pulse 76   Resp 14   Ht 5\' 10"  (1.778 m)   Wt 256 lb (116.1 kg)   BMI 36.73 kg/m    Physical Exam  Constitutional: She is oriented to person, place, and time. She appears well-developed and well-nourished.  HENT:  Head: Normocephalic and atraumatic.  Eyes: EOM are normal. Pupils are equal, round, and reactive to light.  Cardiovascular: Normal rate, regular rhythm and normal heart sounds.  Exam reveals no gallop and no friction rub.   No murmur heard. Pulmonary/Chest: Effort  normal and breath sounds normal. She has no wheezes. She has no rales.  Abdominal: Soft. Bowel sounds are normal. She exhibits no distension. There is no tenderness. There is no guarding. No hernia.  Musculoskeletal: Normal range of motion. She exhibits no edema or deformity. Tenderness: hand joints; no synovitis.  Lymphadenopathy:    She has no cervical adenopathy.  Neurological: She is alert and oriented to person, place, and time. Coordination normal.  Skin: Skin is warm and dry. Capillary refill takes less than 2 seconds. Rash (dry skin, itch, no rash) noted.  Psychiatric: She has a normal mood and affect. Her behavior is normal.  Nursing note and vitals reviewed.    Musculoskeletal Exam:  Full range of motion of all joints Grip strength is equal and strong bilaterally Fibromyalgia tender points are all absent  CDAI Exam: CDAI Homunculus Exam:   Joint Counts:  CDAI Tender Joint count: 0 CDAI Swollen Joint count: 0  No synovitis on examination   Investigation: No additional findings. Office Visit on 05/02/2016  Component Date Value Ref Range Status  . WBC 05/02/2016 6.4  3.8 - 10.8 K/uL Final   Comment: Few atypical lymphocytes noted. Review of peripheral smear confirms automated results.   05/04/2016 RBC 05/02/2016 4.53  3.80 - 5.10 MIL/uL Final  . Hemoglobin 05/02/2016 13.6  11.7 - 15.5 g/dL Final  . HCT 05/04/2016 41.2  35.0 - 45.0 % Final  . MCV 05/02/2016 90.9  80.0 - 100.0 fL Final  . MCH 05/02/2016 30.0  27.0 - 33.0 pg Final  . MCHC 05/02/2016 33.0  32.0 - 36.0 g/dL Final  . RDW 05/04/2016 13.9  11.0 - 15.0 % Final  . Platelets 05/02/2016 307  140 - 400 K/uL Final  . MPV 05/02/2016 10.7  7.5 - 12.5 fL Final  . Neutro Abs 05/02/2016 4096  1,500 - 7,800 cells/uL Final  . Lymphs Abs 05/02/2016 1536  850 - 3,900 cells/uL Final  . Monocytes Absolute 05/02/2016 448  200 - 950 cells/uL Final  . Eosinophils Absolute 05/02/2016 256  15 - 500 cells/uL Final  . Basophils Absolute  05/02/2016 64  0 - 200 cells/uL Final  . Neutrophils Relative % 05/02/2016 64  % Final  . Lymphocytes Relative 05/02/2016 24  % Final  . Monocytes Relative 05/02/2016 7  % Final  . Eosinophils Relative 05/02/2016 4  % Final  . Basophils Relative 05/02/2016 1  % Final  . Smear Review 05/02/2016 Criteria for review not met   Final  . Sodium 05/02/2016 141  135 - 146 mmol/L Final  . Potassium 05/02/2016 4.0  3.5 - 5.3 mmol/L Final  . Chloride 05/02/2016 105  98 - 110 mmol/L Final  . CO2 05/02/2016 26  20 - 31 mmol/L Final  . Glucose, Bld 05/02/2016 91  65 - 99 mg/dL Final  . BUN  05/02/2016 17  7 - 25 mg/dL Final  . Creat 05/02/2016 1.02* 0.60 - 0.93 mg/dL Final   Comment:   For patients > or = 73 years of age: The upper reference limit for Creatinine is approximately 13% higher for people identified as African-American.     . Total Bilirubin 05/02/2016 0.8  0.2 - 1.2 mg/dL Final  . Alkaline Phosphatase 05/02/2016 109  33 - 130 U/L Final  . AST 05/02/2016 16  10 - 35 U/L Final  . ALT 05/02/2016 12  6 - 29 U/L Final  . Total Protein 05/02/2016 7.4  6.1 - 8.1 g/dL Final  . Albumin 05/02/2016 4.2  3.6 - 5.1 g/dL Final  . Calcium 05/02/2016 9.5  8.6 - 10.4 mg/dL Final  . GFR, Est African American 05/02/2016 64  >=60 mL/min Final  . GFR, Est Non African American 05/02/2016 55* >=60 mL/min Final  . Color, Urine 05/02/2016 DARK YELLOW  YELLOW Final  . APPearance 05/02/2016 CLEAR  CLEAR Final  . Specific Gravity, Urine 05/02/2016 1.025  1.001 - 1.035 Final  . pH 05/02/2016 6.0  5.0 - 8.0 Final  . Glucose, UA 05/02/2016 NEGATIVE  NEGATIVE Final  . Bilirubin Urine 05/02/2016 NEGATIVE  NEGATIVE Final  . Ketones, ur 05/02/2016 NEGATIVE  NEGATIVE Final  . Hgb urine dipstick 05/02/2016 NEGATIVE  NEGATIVE Final  . Protein, ur 05/02/2016 NEGATIVE  NEGATIVE Final  . Nitrite 05/02/2016 NEGATIVE  NEGATIVE Final  . Leukocytes, UA 05/02/2016 TRACE* NEGATIVE Final  . WBC, UA 05/02/2016 0-5  <=5 WBC/HPF  Final  . RBC / HPF 05/02/2016 0-2  <=2 RBC/HPF Final  . Squamous Epithelial / LPF 05/02/2016 0-5  <=5 HPF Final  . Bacteria, UA 05/02/2016 NONE SEEN  NONE SEEN HPF Final  . Crystals 05/02/2016 NONE SEEN  NONE SEEN HPF Final  . Casts 05/02/2016 NONE SEEN  NONE SEEN LPF Final  . Yeast 05/02/2016 NONE SEEN  NONE SEEN HPF Final  Abstract on 03/05/2016  Component Date Value Ref Range Status  . HM Mammogram 02/29/2016 0-4 Bi-Rad  0-4 Bi-Rad, Self Reported Normal Final   Negative  Orders Only on 02/29/2016  Component Date Value Ref Range Status  . WBC 02/29/2016 4.5  3.8 - 10.8 K/uL Final  . RBC 02/29/2016 4.40  3.80 - 5.10 MIL/uL Final  . Hemoglobin 02/29/2016 12.9  11.7 - 15.5 g/dL Final  . HCT 02/29/2016 38.9  35.0 - 45.0 % Final  . MCV 02/29/2016 88.4  80.0 - 100.0 fL Final  . MCH 02/29/2016 29.3  27.0 - 33.0 pg Final  . MCHC 02/29/2016 33.2  32.0 - 36.0 g/dL Final  . RDW 02/29/2016 13.6  11.0 - 15.0 % Final  . Platelets 02/29/2016 317  140 - 400 K/uL Final  . MPV 02/29/2016 10.1  7.5 - 12.5 fL Final  . Neutro Abs 02/29/2016 2520  1,500 - 7,800 cells/uL Final  . Lymphs Abs 02/29/2016 1305  850 - 3,900 cells/uL Final  . Monocytes Absolute 02/29/2016 405  200 - 950 cells/uL Final  . Eosinophils Absolute 02/29/2016 225  15 - 500 cells/uL Final  . Basophils Absolute 02/29/2016 45  0 - 200 cells/uL Final  . Neutrophils Relative % 02/29/2016 56  % Final  . Lymphocytes Relative 02/29/2016 29  % Final  . Monocytes Relative 02/29/2016 9  % Final  . Eosinophils Relative 02/29/2016 5  % Final  . Basophils Relative 02/29/2016 1  % Final  . Smear Review 02/29/2016 Criteria for review not  met   Final  . Sodium 02/29/2016 142  135 - 146 mmol/L Final  . Potassium 02/29/2016 4.2  3.5 - 5.3 mmol/L Final  . Chloride 02/29/2016 107  98 - 110 mmol/L Final  . CO2 02/29/2016 25  20 - 31 mmol/L Final  . Glucose, Bld 02/29/2016 93  65 - 99 mg/dL Final  . BUN 02/29/2016 16  7 - 25 mg/dL Final  . Creat  02/29/2016 0.83  0.60 - 0.93 mg/dL Final   Comment:   For patients > or = 73 years of age: The upper reference limit for Creatinine is approximately 13% higher for people identified as African-American.     . Total Bilirubin 02/29/2016 0.8  0.2 - 1.2 mg/dL Final  . Alkaline Phosphatase 02/29/2016 115  33 - 130 U/L Final  . AST 02/29/2016 14  10 - 35 U/L Final  . ALT 02/29/2016 10  6 - 29 U/L Final  . Total Protein 02/29/2016 7.2  6.1 - 8.1 g/dL Final  . Albumin 02/29/2016 4.0  3.6 - 5.1 g/dL Final  . Calcium 02/29/2016 9.7  8.6 - 10.4 mg/dL Final  . GFR, Est African American 02/29/2016 81  >=60 mL/min Final  . GFR, Est Non African American 02/29/2016 71  >=60 mL/min Final  . C3 Complement 02/29/2016 146  90 - 180 mg/dL Final  . C4 Complement 02/29/2016 32  16 - 47 mg/dL Final  . Sed Rate 02/29/2016 6  0 - 30 mm/hr Final  . ds DNA Ab 02/29/2016 <1  IU/mL Final   Comment:                                 IU/mL       Interpretation                               < or = 4    Negative                               5-9         Indeterminate                               > or = 10   Positive     . Anit Nuclear Antibody(ANA) 02/29/2016 POS* NEGATIVE Final  . ANA Pattern 1 02/29/2016 SPECKLED*  Final   Comment: Although the specimen was positive for antinuclear antibodies (ANA), the presence of cytoplasmic fluorescence was noted on the HEp-2 slide. Other reactivities (e.g., anti-mitochondrial antibodies or anti-smooth muscle antibodies) may be responsible for this fluorescence.   . ANA Titer 1 02/29/2016 1:40* titer Final   Comment:           Reference Range           < 1:40      Negative             1:40-1:80 Low Antibody level           > 1:80      Elevated Antibody level      Imaging: No results found.  Speciality Comments: No specialty comments available.    Procedures:  No procedures performed Allergies: Penicillins   Assessment / Plan:     Visit Diagnoses: Autoimmune  disease ([place]) - Plan: CBC with Differential/Platelet, COMPLETE METABOLIC PANEL WITH GFR, VITAMIN D 25 Hydroxy (Vit-D Deficiency, Fractures), Hemoglobin A1c, Thyroid Panel With TSH  High risk medication use - Plan: CBC with Differential/Platelet, COMPLETE METABOLIC PANEL WITH GFR, VITAMIN D 25 Hydroxy (Vit-D Deficiency, Fractures), Hemoglobin A1c, Thyroid Panel With TSH  Primary osteoarthritis of both hands  Primary osteoarthritis of both knees  Primary osteoarthritis of left hip  Always thirsty - Plan: Hemoglobin A1c, Thyroid Panel With TSH  Dry skin dermatitis - Plan: Thyroid Panel With TSH  Other fatigue - Plan: VITAMIN D 25 Hydroxy (Vit-D Deficiency, Fractures), Hemoglobin A1c, Thyroid Panel With TSH  Cold intolerance - Plan: Hemoglobin A1c, Thyroid Panel With TSH  Post-menopausal - Plan: VITAMIN D 25 Hydroxy (Vit-D Deficiency, Fractures)    Plan: #1: Autoimmune disease. Positive ANA with a titer of 1:40 and a speckled pattern; negative sedimentation rate and negative C3-C4 and negative double-stranded DNA on autoimmune workup done January 2018. No oral or nasal ulcers; patient did have ulcers in the roof of the mouth in the past but no more. Ongoing fatigue but that may be coming from possible thyroid issues.; No new rashes. Patient is tolerating methotrexate 6 per week and folic acid 2 mg per day and Plaquenil 200 mg twice a day   #2: High risk prescription Methotrexate 6 per week and folic acid 2 mg daily We were considering decreasing her methotrexate since she is on dual therapy but patient states that alopecia is no longer a problem at 6 pills of methotrexate and she is tolerating 6 pills of methotrexate well. Plaquenil 200 mg twice a day Plaquenil eye exam is normal last year and she'll go in August 2018 to Dr. Payton Emerald office to get an updated Plaquenil eye exam. Patient's labs are normal and were last done March 2018 and she is due for labs today. She had her urinalysis  done in March and it was normal. I do not need to repeat a urinalysis today.   #3: OA of the hands. Ongoing pain at times. No synovitis on examination so therefore I do not think that her autoimmune disease is responsible for this pain.  #4: Bilateral knee pain off and on.  #5: Patient is complaining of increased thirst on a regular basis and since I'm drawing blood today patient requested if I can draw test for diabetes. I offered her hemoglobin A1c patient is agreeable. Patient is complaining of significant fatigue, dry skin, cold intolerance at times. As a result, I will do thyroid panel with TSH. Both of these labs will be forwarded to her PCP so they can treat if needed.  #6: Patient reports that she remembers getting a bone density done long time ago and it was normal. It was ordered by her PCP, Dr. Sherren Mocha. I advised the patient to coordinate with him the next bone density and get it done when it's due. Patient is agreeable.  #7: Return to clinic in 4 months.     Orders: Orders Placed This Encounter  Procedures  . CBC with Differential/Platelet  . COMPLETE METABOLIC PANEL WITH GFR  . VITAMIN D 25 Hydroxy (Vit-D Deficiency, Fractures)  . Hemoglobin A1c  . Thyroid Panel With TSH   No orders of the defined types were placed in this encounter.   Face-to-face time spent with patient was 30 minutes. Greater than 50% of time was spent in counseling and coordination of care.  Follow-Up Instructions: Return in about 4 months (around 12/16/2016) for A.D.//  MTX 6/WK//FOLIC '2MG'$ //PLQ'200mg'$ BID//OA hand,knee,feet pain//?diabetes?thyroid problem.   Eliezer Lofts, PA-C   I examined and evaluated the patient with Eliezer Lofts PA. The plan of care was discussed as noted above.  Bo Merino, MD  Note - This record has been created using Editor, commissioning.  Chart creation errors have been sought, but may not always  have been located. Such creation errors do not reflect on  the  standard of medical care.

## 2016-08-16 LAB — COMPLETE METABOLIC PANEL WITH GFR
ALT: 9 U/L (ref 6–29)
AST: 13 U/L (ref 10–35)
Albumin: 4.4 g/dL (ref 3.6–5.1)
Alkaline Phosphatase: 109 U/L (ref 33–130)
BILIRUBIN TOTAL: 0.7 mg/dL (ref 0.2–1.2)
BUN: 15 mg/dL (ref 7–25)
CALCIUM: 9.7 mg/dL (ref 8.6–10.4)
CHLORIDE: 106 mmol/L (ref 98–110)
CO2: 22 mmol/L (ref 20–31)
Creat: 0.82 mg/dL (ref 0.60–0.93)
GFR, EST AFRICAN AMERICAN: 83 mL/min (ref >=60)
GFR, EST NON AFRICAN AMERICAN: 72 mL/min (ref >=60)
Glucose, Bld: 81 mg/dL (ref 65–99)
Potassium: 3.8 mmol/L (ref 3.5–5.3)
Sodium: 139 mmol/L (ref 135–146)
TOTAL PROTEIN: 7.1 g/dL (ref 6.1–8.1)

## 2016-08-16 LAB — HEMOGLOBIN A1C
HEMOGLOBIN A1C: 5 % (ref <5.7)
MEAN PLASMA GLUCOSE: 97 mg/dL

## 2016-08-16 LAB — THYROID PANEL WITH TSH
FREE THYROXINE INDEX: 2.3 (ref 1.4–3.8)
T3 Uptake: 30 % (ref 22–35)
T4, Total: 7.6 ug/dL (ref 4.5–12.0)
TSH: 2.45 mIU/L

## 2016-08-16 LAB — VITAMIN D 25 HYDROXY (VIT D DEFICIENCY, FRACTURES): VIT D 25 HYDROXY: 30 ng/mL (ref 30–100)

## 2016-08-21 ENCOUNTER — Telehealth: Payer: Self-pay

## 2016-08-21 NOTE — Telephone Encounter (Signed)
Patient is on the list for [facility] 2018 and may be a good candidate for an AWV. Please let me know if/when appt is scheduled.   

## 2016-09-04 ENCOUNTER — Ambulatory Visit: Payer: Medicare HMO | Admitting: Rheumatology

## 2016-09-12 ENCOUNTER — Encounter: Payer: Self-pay | Admitting: Adult Health

## 2016-09-12 ENCOUNTER — Ambulatory Visit (INDEPENDENT_AMBULATORY_CARE_PROVIDER_SITE_OTHER): Payer: Medicare HMO | Admitting: Adult Health

## 2016-09-12 DIAGNOSIS — E669 Obesity, unspecified: Secondary | ICD-10-CM

## 2016-09-12 DIAGNOSIS — G4733 Obstructive sleep apnea (adult) (pediatric): Secondary | ICD-10-CM | POA: Diagnosis not present

## 2016-09-12 NOTE — Assessment & Plan Note (Signed)
Wt loss  

## 2016-09-12 NOTE — Progress Notes (Signed)
@Patient  ID: Taylor Mccall, female    DOB: September 13, 1945, 71 y.o.   MRN: [id number]  Chief Complaint  Patient presents with  . Follow-up    OSA     Referring provider: Dorena Cookey, MD  HPI: 71 year old female followed for obstructive sleep apnea Patient has rheumatoid arthritis on methotrexate  TEST  NPSG 2009:  AHI 21/hr.  Auto 2014:  Optimal pressure 15cm  09/12/2016 Follow up : OSA  Patient presents for a 1 year follow-up. Patient is maintained on C Pap. Patient says she is doing well. She feels rested with no excessive daytime sleepiness. She says that she wakes up frequently,  Sometimes due to joint pains. Sometimes does not feel like her C Pap has enough pressure. She is currently on 15 cm H2O. Download shows excellent compliance with average usage around 6 hours. AHI is 6. Minimum leaks. We discussed weight loss   Allergies  Allergen Reactions  . Penicillins Rash    Immunization History  Administered Date(s) Administered  . Influenza Split 12/14/2010, 12/06/2012  . Influenza Whole 02/06/2004, 12/08/2009  . Influenza, High Dose Seasonal PF 10/26/2015  . Influenza,inj,Quad PF,36+ Mos 10/15/2013, 11/19/2014  . Pneumococcal Conjugate-13 03/18/2013  . Pneumococcal Polysaccharide-23 06/30/2008, 10/26/2015  . Td 02/05/2005  . Tdap 12/14/2010    Past Medical History:  Diagnosis Date  . Anxiety   . Breast mass, left   . Depression   . Gait disturbance   . Hypertension   . Obesity   . Osteoarthritis   . Rheumatoid arthritis(714.0)   . Sleep apnea    cpap  . Urinary incontinence   . Venous insufficiency     Tobacco History: History  Smoking Status  . Former Smoker  . Packs/day: 0.30  . Years: 10.00  . Types: Cigarettes  . Quit date: 02/05/1978  Smokeless Tobacco  . Never Used    Comment: over 25 years ago...pt doesnt remember when she quit.    Counseling given: Not Answered   Outpatient Encounter Prescriptions as of 09/12/2016  Medication Sig    . aspirin 81 MG tablet Take 81 mg by mouth daily.  Marland Kitchen erythromycin (ERY-TAB) 500 MG EC tablet Take 500-1,000 mg by mouth 2 (two) times daily as needed (for dental procedures). Take 1000 mg by mouth 1 hour prior to dental appointment and take 500 mg by mouth 6 hours after dental work.  . folic acid (FOLVITE) 1 MG tablet TAKE TWO TABLETS BY MOUTH ONCE DAILY  . furosemide (LASIX) 20 MG tablet Take 60 mg every other day.  . hydroxychloroquine (PLAQUENIL) 200 MG tablet Take 1 tablet (200 mg total) by mouth 2 (two) times daily.  Marland Kitchen losartan (COZAAR) 50 MG tablet Take 1 tablet (50 mg total) by mouth daily.  . methotrexate 2.5 MG tablet TAKE 6 TABLETS BY MOUTH ONCE A WEEK  . Multiple Vitamins-Minerals (PRESERVISION AREDS 2) CAPS Take 2 capsules by mouth daily.  . naproxen sodium (ANAPROX) 220 MG tablet Take 220 mg by mouth 2 (two) times daily as needed (for pain).  . NON FORMULARY 1 each by Other route See admin instructions. Use CPAP nightly.  . Omega-3 Fatty Acids (FISH OIL) 600 MG CAPS Take 1 capsule by mouth 2 (two) times daily.  . pantoprazole (PROTONIX) 40 MG tablet TAKE ONE TABLET BY MOUTH ONCE DAILY BEFORE BREAKFAST  . potassium chloride SA (K-DUR,KLOR-CON) 20 MEQ tablet Take 2 tablets (40 mEq total) by mouth every morning.  . sertraline (ZOLOFT) 100 MG tablet Take 1  tablet (100 mg total) by mouth every morning.  . timolol (BETIMOL) 0.5 % ophthalmic solution Place 1 drop into the left eye 2 (two) times daily.  Marland Kitchen trimethoprim (TRIMPEX) 100 MG tablet Take 100 mg by mouth 2 (two) times daily.  . vitamin B-12 (CYANOCOBALAMIN) 1000 MCG tablet Take 1,000 mcg by mouth daily.  . Vitamin D, Cholecalciferol, 1000 UNITS TABS Take 1,000 mg by mouth 1 day or 1 dose.  . vitamin E 400 UNIT capsule Take 400 Units by mouth daily.  . carboxymethylcellulose (REFRESH TEARS) 0.5 % SOLN Place 2 drops into the right eye as needed (for itchy eyes).   No facility-administered encounter medications on file as of  09/12/2016.      Review of Systems  Constitutional:   No  weight loss, night sweats,  Fevers, chills, fatigue, or  lassitude.  HEENT:   No headaches,  Difficulty swallowing,  Tooth/dental problems, or  Sore throat,                No sneezing, itching, ear ache, nasal congestion, post nasal drip,   CV:  No chest pain,  Orthopnea, PND, swelling in lower extremities, anasarca, dizziness, palpitations, syncope.   GI  No heartburn, indigestion, abdominal pain, nausea, vomiting, diarrhea, change in bowel habits, loss of appetite, bloody stools.   Resp: No shortness of breath with exertion or at rest.  No excess mucus, no productive cough,  No non-productive cough,  No coughing up of blood.  No change in color of mucus.  No wheezing.  No chest wall deformity  Skin: no rash or lesions.  GU: no dysuria, change in color of urine, no urgency or frequency.  No flank pain, no hematuria   MS:  No joint pain or swelling.  No decreased range of motion.  No back pain.    Physical Exam  BP 132/66 (BP Location: Right Arm, Cuff Size: Large)   Pulse 63   Ht 5\' 10"  (1.778 m)   Wt 271 lb 6.4 oz (123.1 kg)   SpO2 98%   BMI 38.94 kg/m   GEN: A/Ox3; pleasant , NAD, obese   HEENT:  [place]/AT,  EACs-clear, TMs-wnl, NOSE-clear, THROAT-clear, no lesions, no postnasal drip or exudate noted. Class III MP airway  NECK:  Supple w/ fair ROM; no JVD; normal carotid impulses w/o bruits; no thyromegaly or nodules palpated; no lymphadenopathy.    RESP  Clear  P & A; w/o, wheezes/ rales/ or rhonchi. no accessory muscle use, no dullness to percussion  CARD:  RRR, no m/r/g, 1+ peripheral edema, pulses intact, no cyanosis or clubbing.  GI:   Soft & nt; nml bowel sounds; no organomegaly or masses detected.   Musco: Warm bil, no deformities or joint swelling noted.   Neuro: alert, no focal deficits noted.    Skin: Warm, no lesions or rashes    Lab Results:  CBC    Component Value Date/Time   WBC 4.6  08/15/2016 1538   RBC 4.06 08/15/2016 1538   HGB 12.4 08/15/2016 1538   HCT 37.2 08/15/2016 1538   PLT 295 08/15/2016 1538   MCV 91.6 08/15/2016 1538   MCH 30.5 08/15/2016 1538   MCHC 33.3 08/15/2016 1538   RDW 13.6 08/15/2016 1538   LYMPHSABS 1,196 08/15/2016 1538   MONOABS 460 08/15/2016 1538   EOSABS 184 08/15/2016 1538   BASOSABS 46 08/15/2016 1538    BMET    Component Value Date/Time   NA 139 08/15/2016 1538   K  3.8 08/15/2016 1538   CL 106 08/15/2016 1538   CO2 22 08/15/2016 1538   GLUCOSE 81 08/15/2016 1538   BUN 15 08/15/2016 1538   CREATININE 0.82 08/15/2016 1538   CALCIUM 9.7 08/15/2016 1538   GFRNONAA 72 08/15/2016 1538   GFRAA 83 08/15/2016 1538    BNP No results found for: BNP  ProBNP    Component Value Date/Time   PROBNP 16.0 04/12/2006 2125    Imaging: No results found.   Assessment & Plan:   Obstructive sleep apnea Doing well on CPAP  Will adjust pressure to see if more comfortable and get AHI <5.   Plan  Patient Instructions  May use Saline nasal spray and gel (AYR ) As needed   Increase CPAP pressure to 17cmH2O.  Download in 6 weeks .  Continue on CPAP At bedtime   Try to get in 4-6 hrs each night .  Work on weight loss .  Do not drive if sleepy .  follow up in 1 year and As needed  With Dr. Elsworth Soho .     Obesity Wt loss      Rexene Edison, NP 09/12/2016

## 2016-09-12 NOTE — Assessment & Plan Note (Signed)
Doing well on CPAP  Will adjust pressure to see if more comfortable and get AHI <5.   Plan  Patient Instructions  May use Saline nasal spray and gel (AYR ) As needed   Increase CPAP pressure to 17cmH2O.  Download in 6 weeks .  Continue on CPAP At bedtime   Try to get in 4-6 hrs each night .  Work on weight loss .  Do not drive if sleepy .  follow up in 1 year and As needed  With Dr. Elsworth Soho .

## 2016-09-12 NOTE — Addendum Note (Signed)
Addended by: Len Blalock on: 09/12/2016 09:57 AM   Modules accepted: Orders

## 2016-09-12 NOTE — Patient Instructions (Addendum)
May use Saline nasal spray and gel (AYR ) As needed   Increase CPAP pressure to 17cmH2O.  Download in 6 weeks .  Continue on CPAP At bedtime   Try to get in 4-6 hrs each night .  Work on weight loss .  Do not drive if sleepy .  follow up in 1 year and As needed  With Dr. Elsworth Soho .

## 2016-09-13 DIAGNOSIS — H04123 Dry eye syndrome of bilateral lacrimal glands: Secondary | ICD-10-CM | POA: Diagnosis not present

## 2016-09-13 DIAGNOSIS — H401132 Primary open-angle glaucoma, bilateral, moderate stage: Secondary | ICD-10-CM | POA: Diagnosis not present

## 2016-09-13 DIAGNOSIS — H353112 Nonexudative age-related macular degeneration, right eye, intermediate dry stage: Secondary | ICD-10-CM | POA: Diagnosis not present

## 2016-09-13 DIAGNOSIS — Z79899 Other long term (current) drug therapy: Secondary | ICD-10-CM | POA: Diagnosis not present

## 2016-09-13 DIAGNOSIS — H353222 Exudative age-related macular degeneration, left eye, with inactive choroidal neovascularization: Secondary | ICD-10-CM | POA: Diagnosis not present

## 2016-09-13 DIAGNOSIS — M069 Rheumatoid arthritis, unspecified: Secondary | ICD-10-CM | POA: Diagnosis not present

## 2016-09-17 ENCOUNTER — Other Ambulatory Visit: Payer: Self-pay | Admitting: Internal Medicine

## 2016-09-20 DIAGNOSIS — H353124 Nonexudative age-related macular degeneration, left eye, advanced atrophic with subfoveal involvement: Secondary | ICD-10-CM | POA: Diagnosis not present

## 2016-09-20 DIAGNOSIS — H353112 Nonexudative age-related macular degeneration, right eye, intermediate dry stage: Secondary | ICD-10-CM | POA: Diagnosis not present

## 2016-09-20 DIAGNOSIS — Z79899 Other long term (current) drug therapy: Secondary | ICD-10-CM | POA: Diagnosis not present

## 2016-09-20 DIAGNOSIS — H353222 Exudative age-related macular degeneration, left eye, with inactive choroidal neovascularization: Secondary | ICD-10-CM | POA: Diagnosis not present

## 2016-09-28 ENCOUNTER — Other Ambulatory Visit: Payer: Self-pay | Admitting: Rheumatology

## 2016-09-28 MED ORDER — HYDROXYCHLOROQUINE SULFATE 200 MG PO TABS: 200.0000 mg | ORAL_TABLET | Freq: Two times a day (BID) | ORAL | 0 refills | Status: DC

## 2016-09-28 NOTE — Telephone Encounter (Signed)
Patient needs rx called in to [facility] in [place] for her Plaquenil. Patient needs new rx called in due to the discount care she has. Please call patient to advise.

## 2016-09-28 NOTE — Telephone Encounter (Signed)
Last Visit: 08/15/16 Next Visit: 12/20/16 Labs: 08/15/16 cbc/cmp WNL PLQ Eye Exam: 09/2016 WNL  Okay to refill per Dr. Estanislado Pandy

## 2016-10-02 ENCOUNTER — Telehealth: Payer: Self-pay | Admitting: Radiology

## 2016-10-02 DIAGNOSIS — G4733 Obstructive sleep apnea (adult) (pediatric): Secondary | ICD-10-CM | POA: Diagnosis not present

## 2016-10-02 NOTE — Telephone Encounter (Signed)
Please review eye exam / on your desk she has macular degeneration age related, note states long term use plaquenil- stable   Will send for scanning after you review.

## 2016-10-24 ENCOUNTER — Other Ambulatory Visit: Payer: Self-pay | Admitting: Rheumatology

## 2016-10-24 ENCOUNTER — Telehealth: Payer: Self-pay | Admitting: Rheumatology

## 2016-10-24 NOTE — Telephone Encounter (Signed)
Last Visit: 08/15/16 Next Visit: 12/20/16 Labs: 08/15/16 cbc/cmp WNL  Okay to refill per Dr. Estanislado Pandy

## 2016-10-24 NOTE — Telephone Encounter (Signed)
Patient called requesting refill of MTX to be sent to [facility] on [address].

## 2016-10-25 ENCOUNTER — Other Ambulatory Visit: Payer: Self-pay | Admitting: Rheumatology

## 2016-10-25 NOTE — Telephone Encounter (Signed)
Prescription was sent to pharmacy on 10/24/16

## 2016-10-26 ENCOUNTER — Encounter: Payer: Self-pay | Admitting: Family Medicine

## 2016-11-02 DIAGNOSIS — H04123 Dry eye syndrome of bilateral lacrimal glands: Secondary | ICD-10-CM | POA: Diagnosis not present

## 2016-11-02 DIAGNOSIS — H401132 Primary open-angle glaucoma, bilateral, moderate stage: Secondary | ICD-10-CM | POA: Diagnosis not present

## 2016-11-23 ENCOUNTER — Telehealth: Payer: Self-pay

## 2016-11-23 NOTE — Telephone Encounter (Signed)
Patient would like a Rx refill for Folic Acid.  Cb# is [phone].  Please advise.  Thank You.

## 2016-11-23 NOTE — Telephone Encounter (Signed)
Last visit: 08/15/16 Next visit: 12/20/16  Ok to refill per Dr. Estanislado Pandy.

## 2016-11-26 MED ORDER — FOLIC ACID 1 MG PO TABS: 2.0000 mg | ORAL_TABLET | Freq: Every day | ORAL | 3 refills | Status: DC

## 2016-11-26 NOTE — Addendum Note (Signed)
Addended by: Carole Binning on: 11/26/2016 08:23 AM   Modules accepted: Orders

## 2016-12-11 ENCOUNTER — Ambulatory Visit (INDEPENDENT_AMBULATORY_CARE_PROVIDER_SITE_OTHER): Payer: Medicare HMO | Admitting: Family Medicine

## 2016-12-11 ENCOUNTER — Encounter: Payer: Self-pay | Admitting: Family Medicine

## 2016-12-11 VITALS — BP 106/72 | HR 74 | Temp 98.7°F | Ht 70.0 in | Wt 277.0 lb

## 2016-12-11 DIAGNOSIS — I872 Venous insufficiency (chronic) (peripheral): Secondary | ICD-10-CM

## 2016-12-11 DIAGNOSIS — E669 Obesity, unspecified: Secondary | ICD-10-CM

## 2016-12-11 DIAGNOSIS — I10 Essential (primary) hypertension: Secondary | ICD-10-CM | POA: Diagnosis not present

## 2016-12-11 DIAGNOSIS — R600 Localized edema: Secondary | ICD-10-CM

## 2016-12-11 DIAGNOSIS — Z23 Encounter for immunization: Secondary | ICD-10-CM | POA: Diagnosis not present

## 2016-12-11 DIAGNOSIS — Z0001 Encounter for general adult medical examination with abnormal findings: Secondary | ICD-10-CM

## 2016-12-11 DIAGNOSIS — Z0000 Encounter for general adult medical examination without abnormal findings: Secondary | ICD-10-CM

## 2016-12-11 LAB — POCT URINALYSIS DIPSTICK
BILIRUBIN UA: NEGATIVE
Blood, UA: NEGATIVE
GLUCOSE UA: NEGATIVE
KETONES UA: NEGATIVE
LEUKOCYTES UA: NEGATIVE
Nitrite, UA: NEGATIVE
PROTEIN UA: NEGATIVE
SPEC GRAV UA: 1.025 (ref 1.010–1.025)
Urobilinogen, UA: 0.2 E.U./dL
pH, UA: 6 (ref 5.0–8.0)

## 2016-12-11 MED ORDER — LOSARTAN POTASSIUM 25 MG PO TABS: 25.0000 mg | ORAL_TABLET | Freq: Every day | ORAL | 4 refills | Status: DC

## 2016-12-11 MED ORDER — SERTRALINE HCL 100 MG PO TABS: 100.0000 mg | ORAL_TABLET | Freq: Every morning | ORAL | 3 refills | Status: DC

## 2016-12-11 MED ORDER — POTASSIUM CHLORIDE CRYS ER 20 MEQ PO TBCR: 40.0000 meq | EXTENDED_RELEASE_TABLET | Freq: Every morning | ORAL | 3 refills | Status: DC

## 2016-12-11 MED ORDER — FUROSEMIDE 20 MG PO TABS: ORAL_TABLET | ORAL | 4 refills | Status: DC

## 2016-12-11 MED ORDER — PANTOPRAZOLE SODIUM 40 MG PO TBEC: DELAYED_RELEASE_TABLET | ORAL | 4 refills | Status: DC

## 2016-12-11 NOTE — Progress Notes (Signed)
Office Visit Note  Patient: Taylor Mccall             Date of Birth: 11-05-1945           MRN: [id number]             PCP: Dorena Cookey, MD Referring: Dorena Cookey, MD Visit Date: 12/20/2016 Occupation: @GUAROCC @    Subjective:  Other (mouth sores, BIL knee pain/swelling )   History of Present Illness: Taylor Mccall is a 71 y.o. female with history of autoimmune disease and osteoarthritis. She states she's been having recurrent sores in her mouth since she's been on methotrexate. She had a recent episode of oral ulcers  which  started in November.she continues to have swelling in her hands. Her bilateral knees are replaced. She states her knees or swelling . She's also noticed edema on her bilateral lower extremities.  Activities of Daily Living:  Patient reports morning stiffness for 30 minutes.   Patient Denies nocturnal pain.  Difficulty dressing/grooming: Denies Difficulty climbing stairs: Denies Difficulty getting out of chair: Reports Difficulty using hands for taps, buttons, cutlery, and/or writing: Reports   Review of Systems  Constitutional: Positive for fatigue. Negative for night sweats, weight gain, weight loss and weakness.  HENT: Positive for mouth sores and mouth dryness (uses CPAP). Negative for trouble swallowing, trouble swallowing and nose dryness.   Eyes: Positive for dryness. Negative for pain, redness and visual disturbance.  Respiratory: Negative for cough, shortness of breath and difficulty breathing.   Cardiovascular: Positive for swelling in legs/feet. Negative for chest pain, palpitations, hypertension and irregular heartbeat.  Gastrointestinal: Negative for blood in stool, constipation and diarrhea.  Endocrine: Negative for increased urination.  Genitourinary: Negative for vaginal dryness.  Musculoskeletal: Positive for joint swelling and morning stiffness. Negative for arthralgias, joint pain, myalgias, muscle weakness, muscle  tenderness and myalgias.  Skin: Negative for color change, rash, hair loss, skin tightness, ulcers and sensitivity to sunlight.  Allergic/Immunologic: Negative for susceptible to infections.  Neurological: Negative for dizziness, memory loss and night sweats.  Hematological: Negative for swollen glands.  Psychiatric/Behavioral: Positive for depressed mood. Negative for sleep disturbance. The patient is not nervous/anxious.     PMFS History:  Patient Active Problem List   Diagnosis Date Noted  . Routine general medical examination at a health care facility 12/11/2016  . Primary osteoarthritis of left hip 05/01/2016  . Autoimmune disease ([place]) 11/29/2015  . High risk medication use 11/29/2015  . Vitamin D deficiency 11/29/2015  . Macular degeneration 11/29/2015  . Bilateral lower extremity edema 10/26/2015  . H/O total knee replacement, bilateral 12/09/2013  . Breast mass, left 01/18/2011  . Obesity 12/08/2009  . Venous (peripheral) insufficiency 07/28/2009  . PAIN IN JOINT, ANKLE AND FOOT 01/12/2008  . Obstructive sleep apnea 08/29/2007  . GAIT DISTURBANCE 08/14/2007  . Depression 08/26/2006  . Essential hypertension 08/26/2006  . Primary osteoarthritis of both hands 08/26/2006  . Urinary incontinence 08/26/2006    Past Medical History:  Diagnosis Date  . Anxiety   . Breast mass, left   . Depression   . Gait disturbance   . Hypertension   . Obesity   . Osteoarthritis   . Rheumatoid arthritis(714.0)   . Sleep apnea    cpap  . Urinary incontinence   . Venous insufficiency     Family History  Problem Relation Age of Onset  . Diabetes Unknown   . Stroke Unknown   . Asthma Brother   .  Coronary artery disease Father   . Skin cancer Father   . Coronary artery disease Brother    Past Surgical History:  Procedure Laterality Date  . ABDOMINAL HYSTERECTOMY    . BLADDER SURGERY     sling  . BLADDER SUSPENSION  2009  . BREAST REDUCTION SURGERY    . COLONOSCOPY    .  ESOPHAGOGASTRODUODENOSCOPY    . EYE SURGERY    . REPLACEMENT TOTAL KNEE Left   . TOE SURGERY Left    Left great toe  . TOTAL HIP ARTHROPLASTY    . TOTAL KNEE ARTHROPLASTY Right 12/09/2013   Procedure: Right Total Knee Arthroplasty;  Surgeon: Newt Minion, MD;  Location: [facility];  Service: Orthopedics;  Laterality: Right;  Marland Kitchen VESICOVAGINAL FISTULA CLOSURE W/ TAH     Social History   Social History Narrative  . Not on file     Objective: Vital Signs: BP 128/72 (BP Location: Left Arm, Patient Position: Sitting, Cuff Size: Normal)   Pulse 64   Resp 16   Ht 5\' 10"  (1.778 m)   Wt 270 lb (122.5 kg)   BMI 38.74 kg/m    Physical Exam  Constitutional: She is oriented to person, place, and time. She appears well-developed and well-nourished.  HENT:  Head: Normocephalic and atraumatic.  No oral ulcer was noted on examination.  Eyes: Conjunctivae and EOM are normal.  Neck: Normal range of motion.  Cardiovascular: Normal rate, regular rhythm, normal heart sounds and intact distal pulses.  Pulmonary/Chest: Effort normal and breath sounds normal.  Abdominal: Soft. Bowel sounds are normal.  Lymphadenopathy:    She has no cervical adenopathy.  Neurological: She is alert and oriented to person, place, and time.  Skin: Skin is warm and dry. Capillary refill takes less than 2 seconds.  Psychiatric: She has a normal mood and affect. Her behavior is normal.  Nursing note and vitals reviewed.    Musculoskeletal Exam: C-spine good range of motion. Thoracic and lumbar spine was difficult to assess as she was sitting in chair. Shoulder joints, elbow joints with good range of motion. She had no synovitis over her wrist joint or MCPs or PIP joints. Hip joints were difficult to assess in the sitting position. Her bilateral knee joints are replaced which were warm to touch without any effusion. She had a lot of pedal edema bilaterally up to her knees. No tenderness was noted over her ankle joints.  CDAI  Exam: CDAI Homunculus Exam:   Joint Counts:  CDAI Tender Joint count: 0 CDAI Swollen Joint count: 0  Global Assessments:  Patient Global Assessment: 5 Provider Global Assessment: 2  CDAI Calculated Score: 7    Investigation: No additional findings.PLQ eye exam: 03/2016 CBC Latest Ref Rng & Units 12/11/2016 08/15/2016 05/02/2016  WBC 4.0 - 10.5 K/uL 5.7 4.6 6.4  Hemoglobin 12.0 - 15.0 g/dL 13.0 12.4 13.6  Hematocrit 36.0 - 46.0 % 39.4 37.2 41.2  Platelets 150.0 - 400.0 K/uL 289.0 295 307   CMP Latest Ref Rng & Units 12/11/2016 08/15/2016 05/02/2016  Glucose 70 - 99 mg/dL 84 81 91  BUN 6 - 23 mg/dL 18 15 17   Creatinine 0.40 - 1.20 mg/dL 0.88 0.82 1.02(H)  Sodium 135 - 145 mEq/L 142 139 141  Potassium 3.5 - 5.1 mEq/L 4.2 3.8 4.0  Chloride 96 - 112 mEq/L 103 106 105  CO2 19 - 32 mEq/L 31 22 26   Calcium 8.4 - 10.5 mg/dL 10.0 9.7 9.5  Total Protein 6.0 - 8.3  g/dL 7.4 7.1 7.4  Total Bilirubin 0.2 - 1.2 mg/dL 0.7 0.7 0.8  Alkaline Phos 39 - 117 U/L 96 109 109  AST 0 - 37 U/L 13 13 16   ALT 0 - 35 U/L 11 9 12     Imaging: No results found.  Speciality Comments: PLQ eye exam: 03/2016     Procedures:  No procedures performed Allergies: Penicillins   Assessment / Plan:     Visit Diagnoses: Autoimmune disease ([place]) - +ANA, +RNP. Patient believes that she's been having increased joint discomfort and swelling. Although she did not have any synovitis on examination. She complains of frequent oral ulcers since she's been on methotrexate. As she had no synovitis on examination have advised her to discontinue methotrexate. We will opt cervical her on monotherapy with Plaquenil. I will repeat her labs in 3 months.i will give her a prescription for Magic mouthwash.  High risk medication use - MTX 6 tablets by mouth every week, PLQ 200 mg by mouth twice a day, folic acid 1 mg by mouth daily   Primary osteoarthritis of both hands: She had no synovitis on examination.  Primary osteoarthritis  of left hip  H/O total knee replacement, bilateral: She has warmth in her bilateral knee joints but no effusion was noted.  History of vitamin D deficiency: Her vitamin D has been low in the past. I've advised her to take vitamin D 2000 units daily.  Other medical problems are listed as follows:  History of macular degeneration  History of depression  History of sleep apnea    Orders: Orders Placed This Encounter  Procedures  . C3 and C4  . Urinalysis, Routine w reflex microscopic  . Anti-DNA antibody, double-stranded  . Sedimentation rate  . Vitamin D (25 hydroxy)   Meds ordered this encounter  Medications  . magic mouthwash SOLN    Sig: Take 5 mLs 4 (four) times daily by mouth.    Dispense:  225 mL    Refill:  0    Face-to-face time spent with patient was 30 minutes. Greater than 50% of time was spent in counseling and coordination of care.  Follow-Up Instructions: Return in about 5 months (around 05/20/2017) for Autoimmune disease.   Bo Merino, MD  Note - This record has been created using Editor, commissioning.  Chart creation errors have been sought, but may not always  have been located. Such creation errors do not reflect on  the standard of medical care.

## 2016-12-11 NOTE — Patient Instructions (Signed)
Lasix 20 mg....... one by mouth twice a day  Continue your other medications  Follow-up in one year sooner if any problems  Labs today............. I will call you if his anything abnormal  Decrease the Cozaar to 25 mg daily because her blood pressures too low

## 2016-12-11 NOTE — Progress Notes (Signed)
Taylor Mccall is a 71 year old single female nonsmoker who comes in today for annual evaluation because of a history of severe rheumatoid arthritis, hypertension, reflux esophagitis, chronic peripheral edema both lower extremities  She takes Lasix 60 mg every other day however it seems to work better she takes it daily. We'll switch back to that. She takes Cozaar 50 mg for hypertension BP today 106/72 and she's lightheaded when she stands up. We'll decrease her Cozaar 25 mg daily.  She takes Protonix 40 mg daily for chronic reflux esophagitis. She is on oral Plaquenil which causes a lot of GI distress which the Protonix negates.  She takes 40 mEq of potassium daily because of a history of hypokalemia from the Lasix  She takes Zoloft 100 mg daily because of a history of sleep dysfunction. It doesn't seem to help very much she doesn't want to change it she's content to leave alone  She sees her rheumatologist Dr. Keturah Barre on a regular basis.  She gets regular eye exams to she has macular degeneration, regular dental care, mammography annually and a colonoscopy was done 2010 was normal  Vaccinations up-to-date seasonal flu shot given today information given on shingles.  14 point review of systems reviewed and otherwise negative  EKG was done because of a history of hypertension. EKG was normal and unchanged  Cognitive function normal she cannot walk because of her severe arthritis. She's had both knees replaced in her left hip replaced. Home health safety reviewed no issues identified, no guns in the house, she does have a healthcare power of attorney and living well  Social history......... this here in [place] since she was 87 years of age. Her daughter lives with her. She is sedentary and does not do any outside activities. Recommend she try to do something on a social basis with her church.  BP 106/72 (BP Location: Left Arm, Patient Position: Sitting, Cuff Size: Large)   Pulse 74   Temp 98.7 F  (37.1 C) (Oral)   Ht 5\' 10"  (1.778 m)   Wt 277 lb (125.6 kg)   BMI 39.75 kg/m  She's well-developed well-nourished overweight female no acute distress examination HEENT were negative thyroid is not enlarged no carotid bruits cardiopulmonary exam normal breast exam normal except she says evidence of breast reduction. Abdominal exam negative pelvic and rectal not indicated extremities normal skin normal peripheral pulses normal except for bilateral venous insufficiency with edema of both lower extremities.  #1 hypertension at goal....... actually BP too low....... decrease Cozaar 25 mg daily  #2 venous insufficiency with bilateral lower TRAM he edema.......... change Lasix to 40 mg daily continue potassium supplements 40 mEq daily follow-up electrolytes in 4 weeks  #3 rheumatoid arthritis........ continue Plaquenil and methotrexate via your rheumatologist  #5 sleep dysfunction continue Zoloft 100 mg daily

## 2016-12-12 LAB — CBC WITH DIFFERENTIAL/PLATELET
Basophils Absolute: 0.1 10*3/uL (ref 0.0–0.1)
Basophils Relative: 2.2 % (ref 0.0–3.0)
EOS PCT: 6.1 % — AB (ref 0.0–5.0)
Eosinophils Absolute: 0.3 10*3/uL (ref 0.0–0.7)
HCT: 39.4 % (ref 36.0–46.0)
Hemoglobin: 13 g/dL (ref 12.0–15.0)
LYMPHS ABS: 1.3 10*3/uL (ref 0.7–4.0)
Lymphocytes Relative: 23.6 % (ref 12.0–46.0)
MCHC: 32.9 g/dL (ref 30.0–36.0)
MCV: 93.1 fl (ref 78.0–100.0)
MONO ABS: 0.5 10*3/uL (ref 0.1–1.0)
MONOS PCT: 8 % (ref 3.0–12.0)
NEUTROS ABS: 3.4 10*3/uL (ref 1.4–7.7)
NEUTROS PCT: 60.1 % (ref 43.0–77.0)
PLATELETS: 289 10*3/uL (ref 150.0–400.0)
RBC: 4.23 Mil/uL (ref 3.87–5.11)
RDW: 13.7 % (ref 11.5–15.5)
WBC: 5.7 10*3/uL (ref 4.0–10.5)

## 2016-12-12 LAB — BASIC METABOLIC PANEL
BUN: 18 mg/dL (ref 6–23)
CHLORIDE: 103 meq/L (ref 96–112)
CO2: 31 meq/L (ref 19–32)
CREATININE: 0.88 mg/dL (ref 0.40–1.20)
Calcium: 10 mg/dL (ref 8.4–10.5)
GFR: 80.96 mL/min (ref >60.00)
Glucose, Bld: 84 mg/dL (ref 70–99)
POTASSIUM: 4.2 meq/L (ref 3.5–5.1)
SODIUM: 142 meq/L (ref 135–145)

## 2016-12-12 LAB — HEPATIC FUNCTION PANEL
ALBUMIN: 4.4 g/dL (ref 3.5–5.2)
ALT: 11 U/L (ref 0–35)
AST: 13 U/L (ref 0–37)
Alkaline Phosphatase: 96 U/L (ref 39–117)
BILIRUBIN TOTAL: 0.7 mg/dL (ref 0.2–1.2)
Bilirubin, Direct: 0.1 mg/dL (ref 0.0–0.3)
Total Protein: 7.4 g/dL (ref 6.0–8.3)

## 2016-12-12 LAB — TSH: TSH: 2.57 u[IU]/mL (ref 0.35–4.50)

## 2016-12-20 ENCOUNTER — Ambulatory Visit (INDEPENDENT_AMBULATORY_CARE_PROVIDER_SITE_OTHER): Payer: Medicare HMO | Admitting: Rheumatology

## 2016-12-20 ENCOUNTER — Encounter: Payer: Self-pay | Admitting: Rheumatology

## 2016-12-20 VITALS — BP 128/72 | HR 64 | Resp 16 | Ht 70.0 in | Wt 270.0 lb

## 2016-12-20 DIAGNOSIS — M1612 Unilateral primary osteoarthritis, left hip: Secondary | ICD-10-CM | POA: Diagnosis not present

## 2016-12-20 DIAGNOSIS — M359 Systemic involvement of connective tissue, unspecified: Secondary | ICD-10-CM

## 2016-12-20 DIAGNOSIS — Z79899 Other long term (current) drug therapy: Secondary | ICD-10-CM

## 2016-12-20 DIAGNOSIS — M19041 Primary osteoarthritis, right hand: Secondary | ICD-10-CM | POA: Diagnosis not present

## 2016-12-20 DIAGNOSIS — Z96653 Presence of artificial knee joint, bilateral: Secondary | ICD-10-CM

## 2016-12-20 DIAGNOSIS — Z8639 Personal history of other endocrine, nutritional and metabolic disease: Secondary | ICD-10-CM

## 2016-12-20 DIAGNOSIS — Z8669 Personal history of other diseases of the nervous system and sense organs: Secondary | ICD-10-CM

## 2016-12-20 DIAGNOSIS — Z8659 Personal history of other mental and behavioral disorders: Secondary | ICD-10-CM | POA: Diagnosis not present

## 2016-12-20 DIAGNOSIS — M19042 Primary osteoarthritis, left hand: Secondary | ICD-10-CM | POA: Diagnosis not present

## 2016-12-20 DIAGNOSIS — D8989 Other specified disorders involving the immune mechanism, not elsewhere classified: Secondary | ICD-10-CM

## 2016-12-20 DIAGNOSIS — R69 Illness, unspecified: Secondary | ICD-10-CM | POA: Diagnosis not present

## 2016-12-20 MED ORDER — MAGIC MOUTHWASH: 5.0000 mL | Freq: Four times a day (QID) | ORAL | 0 refills | Status: DC

## 2016-12-20 NOTE — Patient Instructions (Signed)
Standing Labs We placed an order today for your standing lab work.    Please come back and get your standing labs in February and every 3 months  We have open lab Monday through Friday from 8:30-11:30 AM and 1:30-4 PM at the office of Dr. Andilynn Delavega.   The office is located at [address], [address], [place], [place] [address] No appointment is necessary.   Labs are drawn by [facility].  You may receive a bill from [facility] for your lab work. If you have any questions regarding directions or hours of operation,  please call [phone].    

## 2017-01-18 DIAGNOSIS — G4733 Obstructive sleep apnea (adult) (pediatric): Secondary | ICD-10-CM | POA: Diagnosis not present

## 2017-02-01 DIAGNOSIS — Z79899 Other long term (current) drug therapy: Secondary | ICD-10-CM | POA: Diagnosis not present

## 2017-02-01 DIAGNOSIS — H401132 Primary open-angle glaucoma, bilateral, moderate stage: Secondary | ICD-10-CM | POA: Diagnosis not present

## 2017-02-01 DIAGNOSIS — H04123 Dry eye syndrome of bilateral lacrimal glands: Secondary | ICD-10-CM | POA: Diagnosis not present

## 2017-02-01 DIAGNOSIS — M069 Rheumatoid arthritis, unspecified: Secondary | ICD-10-CM | POA: Diagnosis not present

## 2017-02-14 DIAGNOSIS — R69 Illness, unspecified: Secondary | ICD-10-CM | POA: Diagnosis not present

## 2017-02-28 DIAGNOSIS — D649 Anemia, unspecified: Secondary | ICD-10-CM | POA: Diagnosis not present

## 2017-02-28 DIAGNOSIS — H409 Unspecified glaucoma: Secondary | ICD-10-CM | POA: Diagnosis not present

## 2017-02-28 DIAGNOSIS — I739 Peripheral vascular disease, unspecified: Secondary | ICD-10-CM | POA: Diagnosis not present

## 2017-02-28 DIAGNOSIS — R32 Unspecified urinary incontinence: Secondary | ICD-10-CM | POA: Diagnosis not present

## 2017-02-28 DIAGNOSIS — M069 Rheumatoid arthritis, unspecified: Secondary | ICD-10-CM | POA: Diagnosis not present

## 2017-02-28 DIAGNOSIS — R69 Illness, unspecified: Secondary | ICD-10-CM | POA: Diagnosis not present

## 2017-02-28 DIAGNOSIS — G4733 Obstructive sleep apnea (adult) (pediatric): Secondary | ICD-10-CM | POA: Diagnosis not present

## 2017-02-28 DIAGNOSIS — K219 Gastro-esophageal reflux disease without esophagitis: Secondary | ICD-10-CM | POA: Diagnosis not present

## 2017-02-28 DIAGNOSIS — I10 Essential (primary) hypertension: Secondary | ICD-10-CM | POA: Diagnosis not present

## 2017-03-04 DIAGNOSIS — Z1231 Encounter for screening mammogram for malignant neoplasm of breast: Secondary | ICD-10-CM | POA: Diagnosis not present

## 2017-03-08 DIAGNOSIS — Z0100 Encounter for examination of eyes and vision without abnormal findings: Secondary | ICD-10-CM | POA: Diagnosis not present

## 2017-03-13 ENCOUNTER — Other Ambulatory Visit: Payer: Self-pay

## 2017-03-13 ENCOUNTER — Telehealth: Payer: Self-pay | Admitting: Rheumatology

## 2017-03-13 DIAGNOSIS — Z79899 Other long term (current) drug therapy: Secondary | ICD-10-CM | POA: Diagnosis not present

## 2017-03-13 DIAGNOSIS — M359 Systemic involvement of connective tissue, unspecified: Secondary | ICD-10-CM

## 2017-03-13 DIAGNOSIS — D8989 Other specified disorders involving the immune mechanism, not elsewhere classified: Secondary | ICD-10-CM | POA: Diagnosis not present

## 2017-03-13 MED ORDER — HYDROXYCHLOROQUINE SULFATE 200 MG PO TABS: 200.0000 mg | ORAL_TABLET | Freq: Two times a day (BID) | ORAL | 0 refills | Status: DC

## 2017-03-13 NOTE — Telephone Encounter (Signed)
Last visit: 12/20/2016 Next visit: 05/30/2017 Labs: 03/13/2017 Eye exam: 03/2016  Patient is aware she needs an updated PLQ eye exam.   Okay to refill per Dr. Estanislado Pandy.

## 2017-03-13 NOTE — Telephone Encounter (Signed)
Patient requested a prescription refill of Hydroxychloroquine be sent to [facility] in [place].

## 2017-03-14 ENCOUNTER — Other Ambulatory Visit: Payer: Self-pay

## 2017-03-14 DIAGNOSIS — E559 Vitamin D deficiency, unspecified: Secondary | ICD-10-CM

## 2017-03-14 LAB — COMPLETE METABOLIC PANEL WITH GFR
AG RATIO: 1.6 (calc) (ref 1.0–2.5)
ALKALINE PHOSPHATASE (APISO): 105 U/L (ref 33–130)
ALT: 10 U/L (ref 6–29)
AST: 14 U/L (ref 10–35)
Albumin: 4.4 g/dL (ref 3.6–5.1)
BILIRUBIN TOTAL: 0.8 mg/dL (ref 0.2–1.2)
BUN: 7 mg/dL (ref 7–25)
CO2: 28 mmol/L (ref 20–32)
Calcium: 9.7 mg/dL (ref 8.6–10.4)
Chloride: 106 mmol/L (ref 98–110)
Creat: 0.78 mg/dL (ref 0.60–0.93)
GFR, Est African American: 87 mL/min/{1.73_m2} (ref >=60)
GFR, Est Non African American: 75 mL/min/{1.73_m2} (ref >=60)
GLOBULIN: 2.8 g/dL (ref 1.9–3.7)
Glucose, Bld: 75 mg/dL (ref 65–99)
POTASSIUM: 4 mmol/L (ref 3.5–5.3)
SODIUM: 141 mmol/L (ref 135–146)
Total Protein: 7.2 g/dL (ref 6.1–8.1)

## 2017-03-14 LAB — URINALYSIS, ROUTINE W REFLEX MICROSCOPIC
Bilirubin Urine: NEGATIVE
GLUCOSE, UA: NEGATIVE
Hgb urine dipstick: NEGATIVE
Ketones, ur: NEGATIVE
LEUKOCYTES UA: NEGATIVE
Nitrite: NEGATIVE
PH: 5.5 (ref 5.0–8.0)
PROTEIN: NEGATIVE
Specific Gravity, Urine: 1.021 (ref 1.001–1.03)

## 2017-03-14 LAB — CBC WITH DIFFERENTIAL/PLATELET
BASOS PCT: 0.9 %
Basophils Absolute: 42 cells/uL (ref 0–200)
Eosinophils Absolute: 249 cells/uL (ref 15–500)
Eosinophils Relative: 5.3 %
HCT: 37.4 % (ref 35.0–45.0)
Hemoglobin: 12.6 g/dL (ref 11.7–15.5)
Lymphs Abs: 1645 cells/uL (ref 850–3900)
MCH: 29 pg (ref 27.0–33.0)
MCHC: 33.7 g/dL (ref 32.0–36.0)
MCV: 86 fL (ref 80.0–100.0)
MONOS PCT: 8.3 %
MPV: 11.5 fL (ref 7.5–12.5)
NEUTROS PCT: 50.5 %
Neutro Abs: 2374 cells/uL (ref 1500–7800)
PLATELETS: 257 10*3/uL (ref 140–400)
RBC: 4.35 10*6/uL (ref 3.80–5.10)
RDW: 12 % (ref 11.0–15.0)
TOTAL LYMPHOCYTE: 35 %
WBC mixed population: 390 cells/uL (ref 200–950)
WBC: 4.7 10*3/uL (ref 3.8–10.8)

## 2017-03-14 LAB — SEDIMENTATION RATE: SED RATE: 9 mm/h (ref 0–30)

## 2017-03-14 LAB — VITAMIN D 25 HYDROXY (VIT D DEFICIENCY, FRACTURES): Vit D, 25-Hydroxy: 25 ng/mL — ABNORMAL LOW (ref 30–100)

## 2017-03-14 LAB — C3 AND C4
C3 Complement: 144 mg/dL (ref 83–193)
C4 Complement: 35 mg/dL (ref 15–57)

## 2017-03-14 LAB — ANTI-DNA ANTIBODY, DOUBLE-STRANDED: ds DNA Ab: <1 IU/mL

## 2017-03-14 MED ORDER — VITAMIN D (ERGOCALCIFEROL) 1.25 MG (50000 UNIT) PO CAPS: 50000.0000 [IU] | ORAL_CAPSULE | ORAL | 0 refills | Status: DC

## 2017-05-16 NOTE — Progress Notes (Signed)
Office Visit Note  Patient: Taylor Mccall             Date of Birth: February 16, 1945           MRN: [id number]             PCP: Dorena Cookey, MD Referring: Dorena Cookey, MD Visit Date: 05/30/2017 Occupation: @GUAROCC @    Subjective:  Hand pain   History of Present Illness: Taylor Mccall is a 72 y.o. female with history of autoimmune disease and osteoarthritis.  Patient states that she continue Zyprexa 200 mg twice daily.  She states that she was taken off of methotrexate due to increased mouth sores.  She says occasionally will get mouth sores but they have improved significantly.  She says that she has been having more hand pain and hand cramping.  She states that her hands also swell.  She states that she continues to have chronic hair loss.  She states that about 2 weeks ago she developed a rash on her right hand which resolved on its own without any topical agents.  She denies any facial rashes.  She denies any swollen lymph nodes.  She states that she has worsening pedal edema bilaterally.  She states that she will occasionally wear compression stockings.  She states that her bilateral knee replacements have been causing increased discomfort.  She states that she will notice occasional swelling in her knees.  She states that her left hip is been doing well.  She states when the pain is severe in her knees she will take an Aleve for pain relief. She states that she has 1 more dose of Vitamin D 50,000 units once a week.   Activities of Daily Living:  Patient reports morning stiffness for 15 minutes.   Patient Reports nocturnal pain.  Difficulty dressing/grooming: Reports Difficulty climbing stairs: Reports Difficulty getting out of chair: Reports Difficulty using hands for taps, buttons, cutlery, and/or writing: Reports   Review of Systems  Constitutional: Positive for fatigue. Negative for fever.  HENT: Negative for mouth sores, mouth dryness and nose dryness.   Eyes:  Negative for pain, visual disturbance and dryness.  Respiratory: Negative for cough, hemoptysis, shortness of breath and difficulty breathing.   Cardiovascular: Positive for swelling in legs/feet. Negative for chest pain, palpitations and hypertension.  Gastrointestinal: Negative for blood in stool, constipation and diarrhea.  Endocrine: Negative for increased urination.  Genitourinary: Negative for difficulty urinating and painful urination.  Musculoskeletal: Positive for arthralgias, joint pain, joint swelling, myalgias and myalgias. Negative for muscle weakness, morning stiffness and muscle tenderness.  Skin: Positive for rash and hair loss. Negative for color change, pallor, nodules/bumps, skin tightness, ulcers and sensitivity to sunlight.  Allergic/Immunologic: Negative for susceptible to infections.  Neurological: Negative for dizziness, numbness, headaches and weakness.  Hematological: Negative for swollen glands.  Psychiatric/Behavioral: Positive for depressed mood and sleep disturbance. The patient is not nervous/anxious.     PMFS History:  Patient Active Problem List   Diagnosis Date Noted  . Routine general medical examination at a health care facility 12/11/2016  . Primary osteoarthritis of left hip 05/01/2016  . Autoimmune disease ([place]) 11/29/2015  . High risk medication use 11/29/2015  . Vitamin D deficiency 11/29/2015  . Macular degeneration 11/29/2015  . Bilateral lower extremity edema 10/26/2015  . H/O total knee replacement, bilateral 12/09/2013  . Breast mass, left 01/18/2011  . Obesity 12/08/2009  . Venous (peripheral) insufficiency 07/28/2009  . PAIN IN JOINT, ANKLE AND  FOOT 01/12/2008  . Obstructive sleep apnea 08/29/2007  . GAIT DISTURBANCE 08/14/2007  . Depression 08/26/2006  . Essential hypertension 08/26/2006  . Primary osteoarthritis of both hands 08/26/2006  . Urinary incontinence 08/26/2006    Past Medical History:  Diagnosis Date  . Anxiety   .  Breast mass, left   . Depression   . Gait disturbance   . Hypertension   . Obesity   . Osteoarthritis   . Rheumatoid arthritis(714.0)   . Sleep apnea    cpap  . Urinary incontinence   . Venous insufficiency     Family History  Problem Relation Age of Onset  . Diabetes Unknown   . Stroke Unknown   . Asthma Brother   . Coronary artery disease Father   . Skin cancer Father   . Coronary artery disease Brother    Past Surgical History:  Procedure Laterality Date  . ABDOMINAL HYSTERECTOMY    . BLADDER SURGERY     sling  . BLADDER SUSPENSION  2009  . BREAST REDUCTION SURGERY    . COLONOSCOPY    . ESOPHAGOGASTRODUODENOSCOPY    . EYE SURGERY    . REPLACEMENT TOTAL KNEE Left   . TOE SURGERY Left    Left great toe  . TOTAL HIP ARTHROPLASTY    . TOTAL KNEE ARTHROPLASTY Right 12/09/2013   Procedure: Right Total Knee Arthroplasty;  Surgeon: Newt Minion, MD;  Location: [place];  Service: Orthopedics;  Laterality: Right;  Marland Kitchen VESICOVAGINAL FISTULA CLOSURE W/ TAH     Social History   Social History Narrative  . Not on file     Objective: Vital Signs: BP 118/83 (BP Location: Left Arm, Patient Position: Sitting, Cuff Size: Normal)   Pulse 77   Resp 16   Ht 5\' 10"  (1.778 m)   Wt 261 lb (118.4 kg)   BMI 37.45 kg/m    Physical Exam  Constitutional: She is oriented to person, place, and time. She appears well-developed and well-nourished.  HENT:  Head: Normocephalic and atraumatic.  Eyes: Conjunctivae and EOM are normal.  Neck: Normal range of motion.  Cardiovascular: Normal rate, regular rhythm, normal heart sounds and intact distal pulses.  Pulmonary/Chest: Effort normal and breath sounds normal.  Abdominal: Soft. Bowel sounds are normal.  Lymphadenopathy:    She has no cervical adenopathy.  Neurological: She is alert and oriented to person, place, and time.  Skin: Skin is warm and dry. Capillary refill takes less than 2 seconds.  Psychiatric: She has a normal mood and  affect. Her behavior is normal.  Nursing note and vitals reviewed.    Musculoskeletal Exam: C-spine limited range of motion.  Thoracic and lumbar spine good range of motion.  No midline spinal tenderness.  No SI joint tenderness.  Shoulder joints, elbow joints, wrist joints, MCPs, PIPs, DIPs good range of motion with no synovitis.  She has subluxation of several PIP and DIP joints.  She has PIP and DIP synovial thickening consistent with osteoarthritis.  Hip joints, knee joints, ankle joints, MTPs, PIPs, DIPs good range of motion with no synovitis.  She has pedal edema bilaterally.  No tenderness of trochanteric bursa.  She has warmth of her right knee.  CDAI Exam: No CDAI exam completed.    Investigation: No additional findings. CBC Latest Ref Rng & Units 03/13/2017 12/11/2016 08/15/2016  WBC 3.8 - 10.8 Thousand/uL 4.7 5.7 4.6  Hemoglobin 11.7 - 15.5 g/dL 12.6 13.0 12.4  Hematocrit 35.0 - 45.0 % 37.4 39.4 37.2  Platelets 140 - 400 Thousand/uL 257 289.0 295   CMP Latest Ref Rng & Units 03/13/2017 12/11/2016 08/15/2016  Glucose 65 - 99 mg/dL 75 84 81  BUN 7 - 25 mg/dL 7 18 15   Creatinine 0.60 - 0.93 mg/dL 0.78 0.88 0.82  Sodium 135 - 146 mmol/L 141 142 139  Potassium 3.5 - 5.3 mmol/L 4.0 4.2 3.8  Chloride 98 - 110 mmol/L 106 103 106  CO2 20 - 32 mmol/L 28 31 22   Calcium 8.6 - 10.4 mg/dL 9.7 10.0 9.7  Total Protein 6.1 - 8.1 g/dL 7.2 7.4 7.1  Total Bilirubin 0.2 - 1.2 mg/dL 0.8 0.7 0.7  Alkaline Phos 39 - 117 U/L - 96 109  AST 10 - 35 U/L 14 13 13   ALT 6 - 29 U/L 10 11 9     Imaging: No results found.  Speciality Comments: PLQ eye exam:02/01/2017 WNL @ [facility]    Procedures:  No procedures performed Allergies: Penicillins   Assessment / Plan:     Visit Diagnoses: Autoimmune disease ([place]) - +ANA, + RNP: She had her autoimmune labs checked on 03/13/2017 and they are within normal limits.  She has not had any signs of a recent flare.  She has no synovitis on exam today.  She  has no cervical lymphadenopathy or malar rash noted.  She continues to have chronic hair loss.  She has no sicca symptoms or symptoms of Raynaud's.  No digital ulcerations noted.  Since discontinuing methotrexate 6 tabs weekly she has noticed improvement in her mouth sores.  She has less severe less regarding ulcers.  She would like to continue on Plaquenil 200 mg twice daily.  High risk medication use -  PLQ 200 mg by mouth twice a day. CBC and CMP: 03/13/17. PLQ eye exam: 02/01/17  Primary osteoarthritis of both hands: She has PIP and DIP synovial thickening consistent with osteoarthritis in bilateral hands.  She has subluxation of several PIP and DIP joints.  Joint reduction muscle strengthening were discussed.  She was given a handout of hand exercises that she can perform at home.  She has been experiencing increased hand cramps and we discussed trying magnesium malate 250 mg at bedtime.  We discussed potential side effects.  If she tolerates the dose of 250 mg we will increase to 500 mg at bedtime.  H/O total knee replacement, bilateral: She has mild warmth of her right knee.  She has good range of motion bilaterally.  She has been experiencing increased discomfort in her bilateral knees.  Primary osteoarthritis of left hip: She has no discomfort at this time.  Vitamin D deficiency -She has been taking vitamin D 50,000 units once a week.  She has 1 more dose.  Vitamin D level rechecked today so she will not have to return in 1 week..  Plan: VITAMIN D 25 Hydroxy (Vit-D Deficiency, Fractures)  Other medical conditions are listed as follows:  History of macular degeneration  History of depression  History of sleep apnea    Orders: Orders Placed This Encounter  Procedures  . VITAMIN D 25 Hydroxy (Vit-D Deficiency, Fractures)   No orders of the defined types were placed in this encounter.    Follow-Up Instructions: Return for Autoimmune Disease, Osteoarthritis.   Ofilia Neas,  PA-C  Note - This record has been created using Dragon software.  Chart creation errors have been sought, but may not always  have been located. Such creation errors do not reflect on  the standard  of medical care.

## 2017-05-22 DIAGNOSIS — G4733 Obstructive sleep apnea (adult) (pediatric): Secondary | ICD-10-CM | POA: Diagnosis not present

## 2017-05-30 ENCOUNTER — Encounter: Payer: Self-pay | Admitting: Physician Assistant

## 2017-05-30 ENCOUNTER — Ambulatory Visit (INDEPENDENT_AMBULATORY_CARE_PROVIDER_SITE_OTHER): Payer: Medicare HMO | Admitting: Physician Assistant

## 2017-05-30 ENCOUNTER — Ambulatory Visit: Payer: Medicare HMO | Admitting: Rheumatology

## 2017-05-30 VITALS — BP 118/83 | HR 77 | Resp 16 | Ht 70.0 in | Wt 261.0 lb

## 2017-05-30 DIAGNOSIS — Z8669 Personal history of other diseases of the nervous system and sense organs: Secondary | ICD-10-CM

## 2017-05-30 DIAGNOSIS — E559 Vitamin D deficiency, unspecified: Secondary | ICD-10-CM | POA: Diagnosis not present

## 2017-05-30 DIAGNOSIS — M19042 Primary osteoarthritis, left hand: Secondary | ICD-10-CM

## 2017-05-30 DIAGNOSIS — M19041 Primary osteoarthritis, right hand: Secondary | ICD-10-CM

## 2017-05-30 DIAGNOSIS — M1612 Unilateral primary osteoarthritis, left hip: Secondary | ICD-10-CM | POA: Diagnosis not present

## 2017-05-30 DIAGNOSIS — M359 Systemic involvement of connective tissue, unspecified: Secondary | ICD-10-CM

## 2017-05-30 DIAGNOSIS — Z8659 Personal history of other mental and behavioral disorders: Secondary | ICD-10-CM

## 2017-05-30 DIAGNOSIS — Z79899 Other long term (current) drug therapy: Secondary | ICD-10-CM | POA: Diagnosis not present

## 2017-05-30 DIAGNOSIS — Z96653 Presence of artificial knee joint, bilateral: Secondary | ICD-10-CM | POA: Diagnosis not present

## 2017-05-30 DIAGNOSIS — R69 Illness, unspecified: Secondary | ICD-10-CM | POA: Diagnosis not present

## 2017-05-30 DIAGNOSIS — D8989 Other specified disorders involving the immune mechanism, not elsewhere classified: Secondary | ICD-10-CM

## 2017-05-30 NOTE — Patient Instructions (Addendum)
Magnesium Malate 250 mg at bedtime If you can tolerate the 250 mg, increase to 500 mg at bedtime  Hand Exercises Hand exercises can be helpful to almost anyone. These exercises can strengthen the hands, improve flexibility and movement, and increase blood flow to the hands. These results can make work and daily tasks easier. Hand exercises can be especially helpful for people who have joint pain from arthritis or have nerve damage from overuse (carpal tunnel syndrome). These exercises can also help people who have injured a hand. Most of these hand exercises are fairly gentle stretching routines. You can do them often throughout the day. Still, it is a good idea to ask your health care provider which exercises would be best for you. Warming your hands before exercise may help to reduce stiffness. You can do this with gentle massage or by placing your hands in warm water for 15 minutes. Also, make sure you pay attention to your level of hand pain as you begin an exercise routine. Exercises Knuckle Bend Repeat this exercise 5-10 times with each hand. 1. Stand or sit with your arm, hand, and all five fingers pointed straight up. Make sure your wrist is straight. 2. Gently and slowly bend your fingers down and inward until the tips of your fingers are touching the tops of your palm. 3. Hold this position for a few seconds. 4. Extend your fingers out to their original position, all pointing straight up again.  Finger Fan Repeat this exercise 5-10 times with each hand. 1. Hold your arm and hand out in front of you. Keep your wrist straight. 2. Squeeze your hand into a fist. 3. Hold this position for a few seconds. 4. Edison Simon out, or spread apart, your hand and fingers as much as possible, stretching every joint fully.  Tabletop Repeat this exercise 5-10 times with each hand. 1. Stand or sit with your arm, hand, and all five fingers pointed straight up. Make sure your wrist is straight. 2. Gently and  slowly bend your fingers at the knuckles where they meet the hand until your hand is making an upside-down L shape. Your fingers should form a tabletop. 3. Hold this position for a few seconds. 4. Extend your fingers out to their original position, all pointing straight up again.  Making Os Repeat this exercise 5-10 times with each hand. 1. Stand or sit with your arm, hand, and all five fingers pointed straight up. Make sure your wrist is straight. 2. Make an O shape by touching your pointer finger to your thumb. Hold for a few seconds. Then open your hand wide. 3. Repeat this motion with each finger on your hand.  Table Spread Repeat this exercise 5-10 times with each hand. 1. Place your hand on a table with your palm facing down. Make sure your wrist is straight. 2. Spread your fingers out as much as possible. Hold this position for a few seconds. 3. Slide your fingers back together again. Hold for a few seconds.  Ball Grip  Repeat this exercise 10-15 times with each hand. 1. Hold a tennis ball or another soft ball in your hand. 2. While slowly increasing pressure, squeeze the ball as hard as possible. 3. Squeeze as hard as you can for 3-5 seconds. 4. Relax and repeat.  Wrist Curls Repeat this exercise 10-15 times with each hand. 1. Sit in a chair that has armrests. 2. Hold a light weight in your hand, such as a dumbbell that weighs 1-3 pounds (  0.5-1.4 kg). Ask your health care provider what weight would be best for you. 3. Rest your hand just over the end of the chair arm with your palm facing up. 4. Gently pivot your wrist up and down while holding the weight. Do not twist your wrist from side to side.  Contact a health care provider if:  Your hand pain or discomfort gets much worse when you do an exercise.  Your hand pain or discomfort does not improve within 2 hours after you exercise. If you have any of these problems, stop doing these exercises right away. Do not do them  again unless your health care provider says that you can. Get help right away if:  You develop sudden, severe hand pain. If this happens, stop doing these exercises right away. Do not do them again unless your health care provider says that you can. This information is not intended to replace advice given to you by your health care provider. Make sure you discuss any questions you have with your health care provider. Document Released: 01/03/2015 Document Revised: 06/30/2015 Document Reviewed: 08/02/2014 Elsevier Interactive Patient Education  [employer].

## 2017-05-31 LAB — VITAMIN D 25 HYDROXY (VIT D DEFICIENCY, FRACTURES): VIT D 25 HYDROXY: 30 ng/mL (ref 30–100)

## 2017-05-31 NOTE — Progress Notes (Signed)
Vitamin D increased slightly.  Please advise her to continue to take vitamin D 50,000 units by mouth once a week.  Please send in a new prescription.

## 2017-06-04 ENCOUNTER — Telehealth: Payer: Self-pay | Admitting: Family Medicine

## 2017-06-04 NOTE — Telephone Encounter (Signed)
Copied from [place] #[id number]. Topic: Quick Communication - Rx Refill/Question >> Jun 04, 2017  4:23 PM Margot Ables wrote: Medication: losartan - pt states she received a letter from insurance that her medication was recalled - pt is also out of medication - please advise Has the patient contacted their pharmacy? Yes.  recalled Preferred Pharmacy (with phone number or street name): [facility] on [address]

## 2017-06-05 NOTE — Telephone Encounter (Signed)
Spoke with pt voiced understanding that she needs to contact her insurance/ pharmacy for an alternative bp medication for dr Sherren Mocha to send in a new Rx.

## 2017-06-07 ENCOUNTER — Telehealth (INDEPENDENT_AMBULATORY_CARE_PROVIDER_SITE_OTHER): Payer: Self-pay

## 2017-06-07 NOTE — Telephone Encounter (Signed)
Think this is Dr Buckner [place] patient

## 2017-06-07 NOTE — Telephone Encounter (Signed)
Please advise 

## 2017-06-07 NOTE — Telephone Encounter (Signed)
Lm on vm asking for results of lab work. cb # [phone]

## 2017-06-11 ENCOUNTER — Other Ambulatory Visit: Payer: Self-pay

## 2017-06-11 MED ORDER — LOSARTAN POTASSIUM 25 MG PO TABS: 25.0000 mg | ORAL_TABLET | Freq: Every day | ORAL | 2 refills | Status: DC

## 2017-06-11 NOTE — Telephone Encounter (Signed)
Pt called in stating that she is returning a call. Pt says that she was told to contact insurance company/pharmacy for alternative bp medication. Pt says that she has not done that because she dont feel that its her responsibility. I did elaborate/ explain to pt why we are asking her to contact insurance. Pt didn't respond as to if she is going to now or not.

## 2017-06-11 NOTE — Telephone Encounter (Signed)
Spoke with pt notified her that pharmacy states that Losartan is no longer on recall. Pt states that she does not want to change to a different BP medication if its safe and no longer on recall since she has been on Losartan for long. Pharmacy requested for more refills on the Rx.

## 2017-06-14 ENCOUNTER — Encounter: Payer: Self-pay | Admitting: *Deleted

## 2017-06-14 ENCOUNTER — Telehealth: Payer: Self-pay | Admitting: *Deleted

## 2017-06-14 MED ORDER — VITAMIN D (ERGOCALCIFEROL) 1.25 MG (50000 UNIT) PO CAPS: 50000.0000 [IU] | ORAL_CAPSULE | ORAL | 0 refills | Status: DC

## 2017-06-14 NOTE — Telephone Encounter (Signed)
-----   Message from Ofilia Neas, PA-C sent at 05/31/2017  8:45 AM EDT ----- Vitamin D increased slightly.  Please advise her to continue to take vitamin D 50,000 units by mouth once a week.  Please send in a new prescription.

## 2017-07-25 ENCOUNTER — Encounter: Payer: Self-pay | Admitting: Adult Health

## 2017-07-25 ENCOUNTER — Ambulatory Visit (INDEPENDENT_AMBULATORY_CARE_PROVIDER_SITE_OTHER): Payer: Medicare HMO | Admitting: Adult Health

## 2017-07-25 VITALS — BP 144/80 | Temp 98.4°F | Wt 273.0 lb

## 2017-07-25 DIAGNOSIS — I872 Venous insufficiency (chronic) (peripheral): Secondary | ICD-10-CM

## 2017-07-25 LAB — BASIC METABOLIC PANEL
BUN: 14 mg/dL (ref 6–23)
CALCIUM: 9.9 mg/dL (ref 8.4–10.5)
CO2: 29 meq/L (ref 19–32)
Chloride: 105 mEq/L (ref 96–112)
Creatinine, Ser: 0.8 mg/dL (ref 0.40–1.20)
GFR: 90.22 mL/min (ref >60.00)
GLUCOSE: 87 mg/dL (ref 70–99)
Potassium: 4 mEq/L (ref 3.5–5.1)
SODIUM: 141 meq/L (ref 135–145)

## 2017-07-25 LAB — BRAIN NATRIURETIC PEPTIDE: Pro B Natriuretic peptide (BNP): 27 pg/mL (ref 0.0–100.0)

## 2017-07-25 NOTE — Patient Instructions (Signed)
It was great seeing you today   Please stop by the lab on your way out   I want you to increase lasix to 40 mg in the morning and 20 mg in the evening   Follow up with me on Monday

## 2017-07-25 NOTE — Progress Notes (Signed)
Subjective:    Patient ID: Taylor Mccall, female    DOB: 1945/05/11, 72 y.o.   MRN: [id number]  HPI  72 year old female who  has a past medical history of Anxiety, Breast mass, left, Depression, Gait disturbance, Hypertension, Obesity, Osteoarthritis, Rheumatoid arthritis(714.0), Sleep apnea, Urinary incontinence, and Venous insufficiency.  She is a patient of Dr. Sherren Mocha who I am seeing today for an acute on chronic issue of worsening lower extremity edema. She reports that she is taking her Lasix 20 mg BID, despite this her lower extremities are getting larger. She denies any leakage, or skin break down. Has not noticed any pain or redness in her calf. She does have shortness of breath with exertion but this is not new.   Echo from 2016  LVEF 55-60%, normal wall thickness, normal wall motion, diastolic dysfunction, indeterminate LV filling pressure. Normal LA size, RVSP 25 mmHg.  Last BMP in 03/2017 - normal kidney function.   Review of Systems See HPI   Past Medical History:  Diagnosis Date  . Anxiety   . Breast mass, left   . Depression   . Gait disturbance   . Hypertension   . Obesity   . Osteoarthritis   . Rheumatoid arthritis(714.0)   . Sleep apnea    cpap  . Urinary incontinence   . Venous insufficiency     Social History   Socioeconomic History  . Marital status: Widowed    Spouse name: Not on file  . Number of children: Not on file  . Years of education: Not on file  . Highest education level: Not on file  Occupational History  . Not on file  Social Needs  . Financial resource strain: Not on file  . Food insecurity:    Worry: Not on file    Inability: Not on file  . Transportation needs:    Medical: Not on file    Non-medical: Not on file  Tobacco Use  . Smoking status: Former Smoker    Packs/day: 0.30    Years: 10.00    Pack years: 3.00    Types: Cigarettes    Last attempt to quit: 02/05/1978    Years since quitting: 39.4  . Smokeless  tobacco: Never Used  . Tobacco comment: over 25 years ago...pt doesnt remember when she quit.   Substance and Sexual Activity  . Alcohol use: No  . Drug use: No  . Sexual activity: Not on file  Lifestyle  . Physical activity:    Days per week: Not on file    Minutes per session: Not on file  . Stress: Not on file  Relationships  . Social connections:    Talks on phone: Not on file    Gets together: Not on file    Attends religious service: Not on file    Active member of club or organization: Not on file    Attends meetings of clubs or organizations: Not on file    Relationship status: Not on file  . Intimate partner violence:    Fear of current or ex partner: Not on file    Emotionally abused: Not on file    Physically abused: Not on file    Forced sexual activity: Not on file  Other Topics Concern  . Not on file  Social History Narrative  . Not on file    Past Surgical History:  Procedure Laterality Date  . ABDOMINAL HYSTERECTOMY    . BLADDER SURGERY  sling  . BLADDER SUSPENSION  2009  . BREAST REDUCTION SURGERY    . COLONOSCOPY    . ESOPHAGOGASTRODUODENOSCOPY    . EYE SURGERY    . REPLACEMENT TOTAL KNEE Left   . TOE SURGERY Left    Left great toe  . TOTAL HIP ARTHROPLASTY    . TOTAL KNEE ARTHROPLASTY Right 12/09/2013   Procedure: Right Total Knee Arthroplasty;  Surgeon: Newt Minion, MD;  Location: [place];  Service: Orthopedics;  Laterality: Right;  Marland Kitchen VESICOVAGINAL FISTULA CLOSURE W/ TAH      Family History  Problem Relation Age of Onset  . Diabetes Unknown   . Stroke Unknown   . Asthma Brother   . Coronary artery disease Father   . Skin cancer Father   . Coronary artery disease Brother     Allergies  Allergen Reactions  . Penicillins Rash    Current Outpatient Medications on File Prior to Visit  Medication Sig Dispense Refill  . aspirin 81 MG tablet Take 81 mg by mouth daily.    . carboxymethylcellulose (REFRESH TEARS) 0.5 % SOLN Place 2 drops  into the right eye as needed (for itchy eyes).    Marland Kitchen erythromycin (ERY-TAB) 500 MG EC tablet Take 500-1,000 mg by mouth 2 (two) times daily as needed (for dental procedures). Take 1000 mg by mouth 1 hour prior to dental appointment and take 500 mg by mouth 6 hours after dental work.    . furosemide (LASIX) 20 MG tablet 1 by mouth twice a day (Patient taking differently: 2 (two) times daily. 1 by mouth twice a day) 200 tablet 4  . hydroxychloroquine (PLAQUENIL) 200 MG tablet Take 1 tablet (200 mg total) by mouth 2 (two) times daily. 180 tablet 0  . latanoprost (XALATAN) 0.005 % ophthalmic solution Place at bedtime into both eyes.    Marland Kitchen losartan (COZAAR) 25 MG tablet Take 1 tablet (25 mg total) by mouth daily. 90 tablet 2  . Multiple Vitamins-Minerals (PRESERVISION AREDS 2) CAPS Take 2 capsules by mouth daily.    . naproxen sodium (ANAPROX) 220 MG tablet Take 220 mg by mouth 2 (two) times daily as needed (for pain).    . NON FORMULARY 1 each by Other route See admin instructions. Use CPAP nightly.    . Omega-3 Fatty Acids (FISH OIL) 600 MG CAPS Take 1 capsule by mouth 2 (two) times daily.    . pantoprazole (PROTONIX) 40 MG tablet TAKE ONE TABLET BY MOUTH ONCE DAILY BEFORE BREAKFAST 100 tablet 4  . potassium chloride SA (K-DUR,KLOR-CON) 20 MEQ tablet Take 2 tablets (40 mEq total) every morning by mouth. 180 tablet 3  . sertraline (ZOLOFT) 100 MG tablet Take 1 tablet (100 mg total) every morning by mouth. 90 tablet 3  . trimethoprim (TRIMPEX) 100 MG tablet Take 100 mg by mouth 2 (two) times daily.    . vitamin B-12 (CYANOCOBALAMIN) 1000 MCG tablet Take 1,000 mcg by mouth daily.    . Vitamin D, Cholecalciferol, 1000 UNITS TABS Take 1,000 mg by mouth 1 day or 1 dose.    . Vitamin D, Ergocalciferol, (DRISDOL) 50000 units CAPS capsule Take 1 capsule (50,000 Units total) by mouth every 7 (seven) days. 12 capsule 0  . vitamin E 400 UNIT capsule Take 400 Units by mouth daily.     No current  facility-administered medications on file prior to visit.     BP (!) 144/80   Temp 98.4 F (36.9 C) (Oral)   Wt 273 lb (  123.8 kg)   BMI 39.17 kg/m       Objective:   Physical Exam  Constitutional: She is oriented to person, place, and time. She appears well-developed and well-nourished. No distress.  Cardiovascular: Normal rate, regular rhythm, normal heart sounds and intact distal pulses. Exam reveals no gallop and no friction rub.  No murmur heard. Pulmonary/Chest: Effort normal and breath sounds normal. No stridor. No respiratory distress. She has no wheezes. She has no rales. She exhibits no tenderness.  Musculoskeletal: She exhibits edema and tenderness.  +1 pitting edema noted in bilateral lower extremities. No open sores or drainage  Neurological: She is alert and oriented to person, place, and time.  Skin: Skin is warm. She is not diaphoretic.  Nursing note and vitals reviewed.     Assessment & Plan:  1. Venous insufficiency - Will have her increase lasix to 40 mg am & 20 mg PM - follow up on Monday  - Brain Natriuretic Peptide - Basic Metabolic Panel - Can consider referral to vein and vascular  - Advised compression socks and to elevate legs at night   Dorothyann Peng, NP

## 2017-07-29 ENCOUNTER — Ambulatory Visit (INDEPENDENT_AMBULATORY_CARE_PROVIDER_SITE_OTHER): Payer: Medicare HMO | Admitting: Adult Health

## 2017-07-29 ENCOUNTER — Encounter: Payer: Self-pay | Admitting: Adult Health

## 2017-07-29 VITALS — BP 130/74 | Temp 97.9°F | Wt 272.0 lb

## 2017-07-29 DIAGNOSIS — I872 Venous insufficiency (chronic) (peripheral): Secondary | ICD-10-CM | POA: Diagnosis not present

## 2017-07-29 NOTE — Progress Notes (Signed)
Subjective:    Patient ID: Taylor Mccall, female    DOB: 1945/07/15, 72 y.o.   MRN: [id number]  HPI 72 year old female who  has a past medical history of Anxiety, Breast mass, left, Depression, Gait disturbance, Hypertension, Obesity, Osteoarthritis, Rheumatoid arthritis(714.0), Sleep apnea, Urinary incontinence, and Venous insufficiency.  She presents to the office today for follow up regarding lower extremity edema. I last saw her one week ago at that time we had increased lasix to 40 mg in the am and 20 mg in the pm. She reports no change in the amount of edema in her lower extremities. She has tried elevating her legs but did not find any relief in this either. She does not like wearing compression socks.   She denies any pain in her lower extremities.   She had venous ultrasound in 2009 which was negative    Review of Systems See HPI   Past Medical History:  Diagnosis Date  . Anxiety   . Breast mass, left   . Depression   . Gait disturbance   . Hypertension   . Obesity   . Osteoarthritis   . Rheumatoid arthritis(714.0)   . Sleep apnea    cpap  . Urinary incontinence   . Venous insufficiency     Social History   Socioeconomic History  . Marital status: Widowed    Spouse name: Not on file  . Number of children: Not on file  . Years of education: Not on file  . Highest education level: Not on file  Occupational History  . Not on file  Social Needs  . Financial resource strain: Not on file  . Food insecurity:    Worry: Not on file    Inability: Not on file  . Transportation needs:    Medical: Not on file    Non-medical: Not on file  Tobacco Use  . Smoking status: Former Smoker    Packs/day: 0.30    Years: 10.00    Pack years: 3.00    Types: Cigarettes    Last attempt to quit: 02/05/1978    Years since quitting: 39.5  . Smokeless tobacco: Never Used  . Tobacco comment: over 25 years ago...pt doesnt remember when she quit.   Substance and Sexual Activity   . Alcohol use: No  . Drug use: No  . Sexual activity: Not on file  Lifestyle  . Physical activity:    Days per week: Not on file    Minutes per session: Not on file  . Stress: Not on file  Relationships  . Social connections:    Talks on phone: Not on file    Gets together: Not on file    Attends religious service: Not on file    Active member of club or organization: Not on file    Attends meetings of clubs or organizations: Not on file    Relationship status: Not on file  . Intimate partner violence:    Fear of current or ex partner: Not on file    Emotionally abused: Not on file    Physically abused: Not on file    Forced sexual activity: Not on file  Other Topics Concern  . Not on file  Social History Narrative  . Not on file    Past Surgical History:  Procedure Laterality Date  . ABDOMINAL HYSTERECTOMY    . BLADDER SURGERY     sling  . BLADDER SUSPENSION  2009  . BREAST REDUCTION SURGERY    .  COLONOSCOPY    . ESOPHAGOGASTRODUODENOSCOPY    . EYE SURGERY    . REPLACEMENT TOTAL KNEE Left   . TOE SURGERY Left    Left great toe  . TOTAL HIP ARTHROPLASTY    . TOTAL KNEE ARTHROPLASTY Right 12/09/2013   Procedure: Right Total Knee Arthroplasty;  Surgeon: Newt Minion, MD;  Location: [facility];  Service: Orthopedics;  Laterality: Right;  Marland Kitchen VESICOVAGINAL FISTULA CLOSURE W/ TAH      Family History  Problem Relation Age of Onset  . Diabetes Unknown   . Stroke Unknown   . Asthma Brother   . Coronary artery disease Father   . Skin cancer Father   . Coronary artery disease Brother     Allergies  Allergen Reactions  . Penicillins Rash    Current Outpatient Medications on File Prior to Visit  Medication Sig Dispense Refill  . aspirin 81 MG tablet Take 81 mg by mouth daily.    . carboxymethylcellulose (REFRESH TEARS) 0.5 % SOLN Place 2 drops into the right eye as needed (for itchy eyes).    Marland Kitchen erythromycin (ERY-TAB) 500 MG EC tablet Take 500-1,000 mg by mouth 2 (two)  times daily as needed (for dental procedures). Take 1000 mg by mouth 1 hour prior to dental appointment and take 500 mg by mouth 6 hours after dental work.    . furosemide (LASIX) 20 MG tablet 1 by mouth twice a day (Patient taking differently: 2 (two) times daily. 1 by mouth twice a day) 200 tablet 4  . hydroxychloroquine (PLAQUENIL) 200 MG tablet Take 1 tablet (200 mg total) by mouth 2 (two) times daily. 180 tablet 0  . latanoprost (XALATAN) 0.005 % ophthalmic solution Place at bedtime into both eyes.    Marland Kitchen losartan (COZAAR) 25 MG tablet Take 1 tablet (25 mg total) by mouth daily. 90 tablet 2  . Multiple Vitamins-Minerals (PRESERVISION AREDS 2) CAPS Take 2 capsules by mouth daily.    . naproxen sodium (ANAPROX) 220 MG tablet Take 220 mg by mouth 2 (two) times daily as needed (for pain).    . NON FORMULARY 1 each by Other route See admin instructions. Use CPAP nightly.    . Omega-3 Fatty Acids (FISH OIL) 600 MG CAPS Take 1 capsule by mouth 2 (two) times daily.    . pantoprazole (PROTONIX) 40 MG tablet TAKE ONE TABLET BY MOUTH ONCE DAILY BEFORE BREAKFAST 100 tablet 4  . potassium chloride SA (K-DUR,KLOR-CON) 20 MEQ tablet Take 2 tablets (40 mEq total) every morning by mouth. 180 tablet 3  . sertraline (ZOLOFT) 100 MG tablet Take 1 tablet (100 mg total) every morning by mouth. 90 tablet 3  . trimethoprim (TRIMPEX) 100 MG tablet Take 100 mg by mouth 2 (two) times daily.    . vitamin B-12 (CYANOCOBALAMIN) 1000 MCG tablet Take 1,000 mcg by mouth daily.    . Vitamin D, Cholecalciferol, 1000 UNITS TABS Take 1,000 mg by mouth 1 day or 1 dose.    . Vitamin D, Ergocalciferol, (DRISDOL) 50000 units CAPS capsule Take 1 capsule (50,000 Units total) by mouth every 7 (seven) days. 12 capsule 0  . vitamin E 400 UNIT capsule Take 400 Units by mouth daily.     No current facility-administered medications on file prior to visit.     BP 130/74   Temp 97.9 F (36.6 C) (Oral)   Wt 272 lb (123.4 kg)   BMI 39.03  kg/m       Objective:   Physical  Exam  Constitutional: She is oriented to person, place, and time. She appears well-developed and well-nourished. No distress.  Cardiovascular: Normal rate, regular rhythm, normal heart sounds and intact distal pulses. Exam reveals no gallop and no friction rub.  No murmur heard. Pulmonary/Chest: Effort normal and breath sounds normal. No stridor. No respiratory distress. She has no wheezes. She has no rales. She exhibits no tenderness.  Musculoskeletal: She exhibits edema. She exhibits no tenderness or deformity.  + 2 pitting edema in bilateral lower extremities   Neurological: She is alert and oriented to person, place, and time.  Skin: Skin is warm and dry. Capillary refill takes less than 2 seconds. She is not diaphoretic.  Psychiatric: She has a normal mood and affect. Her behavior is normal. Judgment and thought content normal.  Nursing note and vitals reviewed.     Assessment & Plan:  1. Venous insufficiency - Will refer to vascular surgery to see if they can help [place]. Advised to go back to original lasix dose of 20 mg BID.  - Advised compression socks when she is at home - Ambulatory referral to Vascular Surgery - Follow up as needed  Dorothyann Peng, AGNP

## 2017-08-01 ENCOUNTER — Other Ambulatory Visit: Payer: Self-pay

## 2017-08-01 DIAGNOSIS — R609 Edema, unspecified: Secondary | ICD-10-CM

## 2017-08-26 DIAGNOSIS — G4733 Obstructive sleep apnea (adult) (pediatric): Secondary | ICD-10-CM | POA: Diagnosis not present

## 2017-09-19 DIAGNOSIS — H04123 Dry eye syndrome of bilateral lacrimal glands: Secondary | ICD-10-CM | POA: Diagnosis not present

## 2017-09-19 DIAGNOSIS — M069 Rheumatoid arthritis, unspecified: Secondary | ICD-10-CM | POA: Diagnosis not present

## 2017-09-19 DIAGNOSIS — H401132 Primary open-angle glaucoma, bilateral, moderate stage: Secondary | ICD-10-CM | POA: Diagnosis not present

## 2017-09-19 DIAGNOSIS — Z79899 Other long term (current) drug therapy: Secondary | ICD-10-CM | POA: Diagnosis not present

## 2017-09-26 DIAGNOSIS — H353112 Nonexudative age-related macular degeneration, right eye, intermediate dry stage: Secondary | ICD-10-CM | POA: Diagnosis not present

## 2017-09-26 DIAGNOSIS — H353222 Exudative age-related macular degeneration, left eye, with inactive choroidal neovascularization: Secondary | ICD-10-CM | POA: Diagnosis not present

## 2017-09-26 DIAGNOSIS — Z79899 Other long term (current) drug therapy: Secondary | ICD-10-CM | POA: Diagnosis not present

## 2017-09-26 DIAGNOSIS — H353124 Nonexudative age-related macular degeneration, left eye, advanced atrophic with subfoveal involvement: Secondary | ICD-10-CM | POA: Diagnosis not present

## 2017-10-01 ENCOUNTER — Other Ambulatory Visit: Payer: Self-pay

## 2017-10-01 ENCOUNTER — Ambulatory Visit (INDEPENDENT_AMBULATORY_CARE_PROVIDER_SITE_OTHER): Payer: Medicare HMO | Admitting: Vascular Surgery

## 2017-10-01 ENCOUNTER — Encounter: Payer: Self-pay | Admitting: Vascular Surgery

## 2017-10-01 ENCOUNTER — Ambulatory Visit (HOSPITAL_COMMUNITY)
Admission: RE | Admit: 2017-10-01 | Discharge: 2017-10-01 | Disposition: A | Discharge status: Home / Self Care | Payer: Medicare HMO | Source: Ambulatory Visit | Attending: Vascular Surgery | Admitting: Vascular Surgery

## 2017-10-01 VITALS — BP 132/83 | HR 73 | Resp 20 | Ht 70.0 in | Wt 278.0 lb

## 2017-10-01 DIAGNOSIS — R936 Abnormal findings on diagnostic imaging of limbs: Secondary | ICD-10-CM | POA: Insufficient documentation

## 2017-10-01 DIAGNOSIS — R609 Edema, unspecified: Secondary | ICD-10-CM | POA: Diagnosis not present

## 2017-10-01 DIAGNOSIS — I890 Lymphedema, not elsewhere classified: Secondary | ICD-10-CM | POA: Diagnosis not present

## 2017-10-01 NOTE — Progress Notes (Signed)
[facility] [place]  Patient name: Taylor Mccall MRN: [id number] DOB: 1945/11/21 Sex: female  REASON FOR CONSULT: Valuation bilateral lower extremity swelling  HPI: Taylor Mccall is a 72 y.o. female, who is her today for evaluation of lower extremity swelling.  She is a very pleasant 72 year old who reports a many year history of progressive swelling in both lower extremities.  This is been present for over 5 years.  This is painless.  She has been treated with Lasix diuresis with minimal improvement.  She does report that it is less swollen when she first arises in the morning and progresses throughout the day.  She has had no skin breakdown.  The swelling does extend onto the dorsum of her foot and also onto her toes.  She has had extensive past podiatry surgery with hammertoes on most of the toes of both feet.  She has had total knee replacements.  Does have rheumatoid arthritis.  She reports that the swelling predates the knee replacements.  She denies any history of DVT.  No history of congestive heart failure.  Past Medical History:  Diagnosis Date  . Anxiety   . Breast mass, left   . Depression   . Gait disturbance   . Hypertension   . Obesity   . Osteoarthritis   . Rheumatoid arthritis(714.0)   . Sleep apnea    cpap  . Urinary incontinence   . Venous insufficiency     Family History  Problem Relation Age of Onset  . Diabetes Unknown   . Stroke Unknown   . Asthma Brother   . Heart disease Brother   . Coronary artery disease Father   . Skin cancer Father   . Heart disease Father   . Coronary artery disease Brother     SOCIAL HISTORY: Social History   Socioeconomic History  . Marital status: Widowed    Spouse name: Not on file  . Number of children: Not on file  . Years of education: Not on file  . Highest education level: Not on file  Occupational History  . Not on file  Social Needs  .  Financial resource strain: Not on file  . Food insecurity:    Worry: Not on file    Inability: Not on file  . Transportation needs:    Medical: Not on file    Non-medical: Not on file  Tobacco Use  . Smoking status: Former Smoker    Packs/day: 0.30    Years: 10.00    Pack years: 3.00    Types: Cigarettes    Last attempt to quit: 02/05/1978    Years since quitting: 39.6  . Smokeless tobacco: Never Used  . Tobacco comment: over 25 years ago...pt doesnt remember when she quit.   Substance and Sexual Activity  . Alcohol use: No  . Drug use: No  . Sexual activity: Not on file  Lifestyle  . Physical activity:    Days per week: Not on file    Minutes per session: Not on file  . Stress: Not on file  Relationships  . Social connections:    Talks on phone: Not on file    Gets together: Not on file    Attends religious service: Not on file    Active member of club or organization: Not on file    Attends meetings of clubs or organizations: Not on file    Relationship status: Not on file  . Intimate partner violence:  Fear of current or ex partner: Not on file    Emotionally abused: Not on file    Physically abused: Not on file    Forced sexual activity: Not on file  Other Topics Concern  . Not on file  Social History Narrative  . Not on file    Allergies  Allergen Reactions  . Penicillins Rash    Current Outpatient Medications  Medication Sig Dispense Refill  . aspirin 81 MG tablet Take 81 mg by mouth daily.    . carboxymethylcellulose (REFRESH TEARS) 0.5 % SOLN Place 2 drops into the right eye as needed (for itchy eyes).    . furosemide (LASIX) 20 MG tablet 1 by mouth twice a day (Patient taking differently: 2 (two) times daily. 1 by mouth twice a day) 200 tablet 4  . hydroxychloroquine (PLAQUENIL) 200 MG tablet Take 1 tablet (200 mg total) by mouth 2 (two) times daily. 180 tablet 0  . latanoprost (XALATAN) 0.005 % ophthalmic solution Place at bedtime into both eyes.      Marland Kitchen losartan (COZAAR) 25 MG tablet Take 1 tablet (25 mg total) by mouth daily. 90 tablet 2  . Multiple Vitamins-Minerals (PRESERVISION AREDS 2) CAPS Take 2 capsules by mouth daily.    . naproxen sodium (ANAPROX) 220 MG tablet Take 220 mg by mouth 2 (two) times daily as needed (for pain).    . NON FORMULARY 1 each by Other route See admin instructions. Use CPAP nightly.    . Omega-3 Fatty Acids (FISH OIL) 600 MG CAPS Take 1 capsule by mouth 2 (two) times daily.    . pantoprazole (PROTONIX) 40 MG tablet TAKE ONE TABLET BY MOUTH ONCE DAILY BEFORE BREAKFAST 100 tablet 4  . potassium chloride SA (K-DUR,KLOR-CON) 20 MEQ tablet Take 2 tablets (40 mEq total) every morning by mouth. 180 tablet 3  . sertraline (ZOLOFT) 100 MG tablet Take 1 tablet (100 mg total) every morning by mouth. 90 tablet 3  . vitamin B-12 (CYANOCOBALAMIN) 1000 MCG tablet Take 1,000 mcg by mouth daily.    . Vitamin D, Ergocalciferol, (DRISDOL) 50000 units CAPS capsule Take 1 capsule (50,000 Units total) by mouth every 7 (seven) days. 12 capsule 0  . vitamin E 400 UNIT capsule Take 400 Units by mouth daily.    Marland Kitchen erythromycin (ERY-TAB) 500 MG EC tablet Take 500-1,000 mg by mouth 2 (two) times daily as needed (for dental procedures). Take 1000 mg by mouth 1 hour prior to dental appointment and take 500 mg by mouth 6 hours after dental work.    . trimethoprim (TRIMPEX) 100 MG tablet Take 100 mg by mouth 2 (two) times daily.    . Vitamin D, Cholecalciferol, 1000 UNITS TABS Take 1,000 mg by mouth 1 day or 1 dose.     No current facility-administered medications for this visit.     REVIEW OF SYSTEMS:  [X]  denotes positive finding, [ ]  denotes negative finding Cardiac  Comments:  Chest pain or chest pressure:    Shortness of breath upon exertion:    Short of breath when lying flat:    Irregular heart rhythm:        Vascular    Pain in calf, thigh, or hip brought on by ambulation:    Pain in feet at night that wakes you up from your  sleep:  x   Blood clot in your veins:    Leg swelling:  x       Pulmonary    Oxygen at home:  Productive cough:     Wheezing:         Neurologic    Sudden weakness in arms or legs:     Sudden numbness in arms or legs:     Sudden onset of difficulty speaking or slurred speech:    Temporary loss of vision in one eye:     Problems with dizziness:         Gastrointestinal    Blood in stool:     Vomited blood:         Genitourinary    Burning when urinating:     Blood in urine:        Psychiatric    Major depression:         Hematologic    Bleeding problems:    Problems with blood clotting too easily:        Skin    Rashes or ulcers:        Constitutional    Fever or chills:      PHYSICAL EXAM: Vitals:   10/01/17 1427  BP: 132/83  Pulse: 73  Resp: 20  SpO2: 99%  Weight: 278 lb (126.1 kg)  Height: 5\' 10"  (1.778 m)    GENERAL: The patient is a well-nourished female, in no acute distress. The vital signs are documented above. CARDIOVASCULAR: 2+ radial pulses.  I do not palpate pedal pulses related to her marked swelling.  She does have biphasic Doppler flow at the dorsalis pedis bilaterally. PULMONARY: There is good air exchange  ABDOMEN: Soft and non-tender  MUSCULOSKELETAL: There are no major deformities or cyanosis. NEUROLOGIC: No focal weakness or paresthesias are detected. SKIN: There are no ulcers or rashes noted.  She does not have any hemosiderin deposits.  Does have marked swelling and does have overlap of the swelling over her ankles and extends onto her feet and toes. PSYCHIATRIC: The patient has a normal affect.  DATA:  Venous duplex today shows no evidence of DVT.  She does have reflux at the saphenofemoral junction but no other reflux  MEDICAL ISSUES: Long discussion with patient regarding her physical findings.  This is classic lymphedema.  I explained the critical importance of elevation when possible and also compression.  She has tried  compression in the past and this is typical is a very difficult time complying with this.  I did explain that this will slow the progression of her lymphedema.  Also I have asked her to attempt wrapping with an Ace wrap if she is unable to place compression garments.  We will also attempt referral to lymphedema therapist for consideration of therapeutic massage.  Explained that this is sometimes difficult to obtain referral but we will attempt.  She will see [place] again on an as-needed basis   Rosetta Posner, MD [facility] Tel [phone] Pager [phone]

## 2017-10-17 NOTE — Progress Notes (Deleted)
Office Visit Note  Patient: Taylor Mccall             Date of Birth: 10-11-1945           MRN: [id number]             PCP: Dorothyann Peng, NP Referring: Dorena Cookey, MD Visit Date: 10/31/2017 Occupation: @GUAROCC @  Subjective:  No chief complaint on file.   History of Present Illness: Taylor Mccall is a 72 y.o. female ***   Activities of Daily Living:  Patient reports morning stiffness for *** {minute/hour:19697}.   Patient {ACTIONS;DENIES/REPORTS:21021675::"Denies"} nocturnal pain.  Difficulty dressing/grooming: {ACTIONS;DENIES/REPORTS:21021675::"Denies"} Difficulty climbing stairs: {ACTIONS;DENIES/REPORTS:21021675::"Denies"} Difficulty getting out of chair: {ACTIONS;DENIES/REPORTS:21021675::"Denies"} Difficulty using hands for taps, buttons, cutlery, and/or writing: {ACTIONS;DENIES/REPORTS:21021675::"Denies"}  No Rheumatology ROS completed.   PMFS History:  Patient Active Problem List   Diagnosis Date Noted  . Routine general medical examination at a health care facility 12/11/2016  . Primary osteoarthritis of left hip 05/01/2016  . Autoimmune disease ([place]) 11/29/2015  . High risk medication use 11/29/2015  . Vitamin D deficiency 11/29/2015  . Macular degeneration 11/29/2015  . Bilateral lower extremity edema 10/26/2015  . H/O total knee replacement, bilateral 12/09/2013  . Breast mass, left 01/18/2011  . Obesity 12/08/2009  . Venous (peripheral) insufficiency 07/28/2009  . PAIN IN JOINT, ANKLE AND FOOT 01/12/2008  . Obstructive sleep apnea 08/29/2007  . GAIT DISTURBANCE 08/14/2007  . Depression 08/26/2006  . Essential hypertension 08/26/2006  . Primary osteoarthritis of both hands 08/26/2006  . Urinary incontinence 08/26/2006    Past Medical History:  Diagnosis Date  . Anxiety   . Breast mass, left   . Depression   . Gait disturbance   . Hypertension   . Obesity   . Osteoarthritis   . Rheumatoid arthritis(714.0)   . Sleep apnea    cpap  .  Urinary incontinence   . Venous insufficiency     Family History  Problem Relation Age of Onset  . Diabetes Unknown   . Stroke Unknown   . Asthma Brother   . Heart disease Brother   . Coronary artery disease Father   . Skin cancer Father   . Heart disease Father   . Coronary artery disease Brother    Past Surgical History:  Procedure Laterality Date  . ABDOMINAL HYSTERECTOMY    . BLADDER SURGERY     sling  . BLADDER SUSPENSION  2009  . BREAST REDUCTION SURGERY    . COLONOSCOPY    . ESOPHAGOGASTRODUODENOSCOPY    . EYE SURGERY    . REPLACEMENT TOTAL KNEE Left   . TOE SURGERY Left    Left great toe  . TOTAL HIP ARTHROPLASTY    . TOTAL KNEE ARTHROPLASTY Right 12/09/2013   Procedure: Right Total Knee Arthroplasty;  Surgeon: Newt Minion, MD;  Location: [place];  Service: Orthopedics;  Laterality: Right;  Marland Kitchen VESICOVAGINAL FISTULA CLOSURE W/ TAH     Social History   Social History Narrative  . Not on file    Objective: Vital Signs: There were no vitals taken for this visit.   Physical Exam   Musculoskeletal Exam: ***  CDAI Exam: CDAI Score: Not documented Patient Global Assessment: Not documented; Provider Global Assessment: Not documented Swollen: Not documented; Tender: Not documented Joint Exam   Not documented   There is currently no information documented on the homunculus. Go to the Rheumatology activity and complete the homunculus joint exam.  Investigation: No additional findings.  Imaging:  No results found.  Recent Labs: Lab Results  Component Value Date   WBC 4.7 03/13/2017   HGB 12.6 03/13/2017   PLT 257 03/13/2017   NA 141 07/25/2017   K 4.0 07/25/2017   CL 105 07/25/2017   CO2 29 07/25/2017   GLUCOSE 87 07/25/2017   BUN 14 07/25/2017   CREATININE 0.80 07/25/2017   BILITOT 0.8 03/13/2017   ALKPHOS 96 12/11/2016   AST 14 03/13/2017   ALT 10 03/13/2017   PROT 7.2 03/13/2017   ALBUMIN 4.4 12/11/2016   CALCIUM 9.9 07/25/2017   GFRAA 87  03/13/2017    Speciality Comments: PLQ eye exam:09/19/17 WNL @ [facility]  Procedures:  No procedures performed Allergies: Penicillins   Assessment / Plan:     Visit Diagnoses: Autoimmune disease ([place]) - +ANA, + RNP  High risk medication use -   PLQ 200 mg by mouth twice a day.  Primary osteoarthritis of both hands  H/O total knee replacement, bilateral  Primary osteoarthritis of left hip  Vitamin D deficiency  History of macular degeneration  History of depression  History of sleep apnea   Orders: No orders of the defined types were placed in this encounter.  No orders of the defined types were placed in this encounter.   Face-to-face time spent with patient was *** minutes. Greater than 50% of time was spent in counseling and coordination of care.  Follow-Up Instructions: No follow-ups on file.   Ofilia Neas, PA-C  Note - This record has been created using Dragon software.  Chart creation errors have been sought, but may not always  have been located. Such creation errors do not reflect on  the standard of medical care.

## 2017-10-21 ENCOUNTER — Ambulatory Visit: Payer: Medicare HMO | Admitting: Podiatry

## 2017-10-28 ENCOUNTER — Ambulatory Visit (INDEPENDENT_AMBULATORY_CARE_PROVIDER_SITE_OTHER): Payer: Medicare HMO | Admitting: Podiatry

## 2017-10-28 ENCOUNTER — Ambulatory Visit (INDEPENDENT_AMBULATORY_CARE_PROVIDER_SITE_OTHER): Payer: Medicare HMO

## 2017-10-28 ENCOUNTER — Encounter: Payer: Self-pay | Admitting: Podiatry

## 2017-10-28 VITALS — BP 128/72 | HR 78 | Resp 18

## 2017-10-28 DIAGNOSIS — L989 Disorder of the skin and subcutaneous tissue, unspecified: Secondary | ICD-10-CM | POA: Diagnosis not present

## 2017-10-28 DIAGNOSIS — M778 Other enthesopathies, not elsewhere classified: Secondary | ICD-10-CM

## 2017-10-28 DIAGNOSIS — M779 Enthesopathy, unspecified: Secondary | ICD-10-CM | POA: Diagnosis not present

## 2017-10-29 NOTE — Progress Notes (Signed)
   Subjective: 72 year old female presenting today as a new patient with a chief complaint of painful callus lesions noted to bilateral feet that have been present for several years. Ambulation in shoes increases the pain. She has taken OTC Aleve for treatment of her symptoms. She has also purchased new shoes from the [employer] but states they do not help. Patient is here for further evaluation and treatment.   Past Medical History:  Diagnosis Date  . Anxiety   . Breast mass, left   . Depression   . Gait disturbance   . Hypertension   . Obesity   . Osteoarthritis   . Rheumatoid arthritis(714.0)   . Sleep apnea    cpap  . Urinary incontinence   . Venous insufficiency      Objective:  Physical Exam General: Alert and oriented x3 in no acute distress  Dermatology: Hyperkeratotic lesions present on the bilateral feet x 4. Pain on palpation with a central nucleated core noted. Skin is warm, dry and supple bilateral lower extremities. Negative for open lesions or macerations.  Vascular: Palpable pedal pulses bilaterally. No edema or erythema noted. Capillary refill within normal limits.  Neurological: Epicritic and protective threshold grossly intact bilaterally.   Musculoskeletal Exam: Pain on palpation at the keratotic lesions noted. Range of motion within normal limits bilateral. Muscle strength 5/5 in all groups bilateral.  Radiographic Exam:  Normal osseous mineralization. Joint spaces preserved. No fracture/dislocation/boney destruction. Orthopedic hardware noted bilaterally without complication.    Assessment: 1. Porokeratosis bilateral feet x 4 2. H/o bilateral foot surgery   Plan of Care:  1. Patient evaluated. X-Rays reviewed.  2. Excisional debridement of keratoic lesions using a chisel blade was performed without incident.  3. Dressed area with light dressing. 4. Continue wearing current shoes that alleviate symptoms.  5. Patient is to return to the clinic PRN.    Edrick Kins, DPM [facility]  Dr. Edrick Kins, [address]                                        [place], [place] [address]                Office [phone]  Fax [phone]

## 2017-10-29 NOTE — Progress Notes (Signed)
Office Visit Note  Patient: Taylor Mccall             Date of Birth: 12/10/1945           MRN: [id number]             PCP: Dorothyann Peng, NP Referring: Dorena Cookey, MD Visit Date: 11/01/2017 Occupation: @GUAROCC @  Subjective:  Pain in both hands   History of Present Illness: Taylor Mccall is a 72 y.o. female with history of autoimmune disease and osteoarthritis.  Patient is on PLQ 200 mg by mouth BID.  She denies any recent flares of autoimmune disease.  She states she is having pain and intermittent swelling in both hands.  She denies any other joint pain or joint swelling at this time.  She continues to have chronic fatigue but she denies any recent low grade fevers or swollen lymph nodes.  She continues to have sicca symptoms but denies any sores in her mouth or nose. She denies symptoms of Raynaud's.  She has not had any recent rashes or worsening hair loss.  She continues to have sun sensitivity.  She denies any shortness of breath or palpitations.  She reports she has been having worsening pedal edema.  She was referred to a vascular specialist who diagnosed her with lymphedema.     Activities of Daily Living:  Patient reports morning stiffness for 30 minutes.   Patient Reports nocturnal pain.  Difficulty dressing/grooming: Denies Difficulty climbing stairs: Reports Difficulty getting out of chair: Reports Difficulty using hands for taps, buttons, cutlery, and/or writing: Reports  Review of Systems  Constitutional: Positive for fatigue.  HENT: Positive for mouth dryness. Negative for mouth sores, trouble swallowing, trouble swallowing and nose dryness.   Eyes: Positive for itching and dryness. Negative for pain and visual disturbance.  Respiratory: Negative for cough, hemoptysis, shortness of breath and difficulty breathing.   Cardiovascular: Negative for chest pain, palpitations, hypertension and swelling in legs/feet.  Gastrointestinal: Negative for abdominal  pain, blood in stool, constipation, diarrhea, nausea and vomiting.  Endocrine: Negative for increased urination.  Genitourinary: Negative for painful urination, nocturia and pelvic pain.  Musculoskeletal: Positive for arthralgias, joint pain, joint swelling and morning stiffness. Negative for myalgias, muscle weakness, muscle tenderness and myalgias.  Skin: Negative for color change, pallor, rash, hair loss, nodules/bumps, skin tightness, ulcers and sensitivity to sunlight.  Allergic/Immunologic: Negative for susceptible to infections.  Neurological: Positive for memory loss. Negative for dizziness, light-headedness, numbness, headaches and weakness.  Hematological: Negative for swollen glands.  Psychiatric/Behavioral: Negative for depressed mood, confusion and sleep disturbance. The patient is not nervous/anxious.     PMFS History:  Patient Active Problem List   Diagnosis Date Noted  . Routine general medical examination at a health care facility 12/11/2016  . Primary osteoarthritis of left hip 05/01/2016  . Autoimmune disease ([place]) 11/29/2015  . High risk medication use 11/29/2015  . Vitamin D deficiency 11/29/2015  . Macular degeneration 11/29/2015  . Bilateral lower extremity edema 10/26/2015  . H/O total knee replacement, bilateral 12/09/2013  . Breast mass, left 01/18/2011  . Obesity 12/08/2009  . Venous (peripheral) insufficiency 07/28/2009  . PAIN IN JOINT, ANKLE AND FOOT 01/12/2008  . Obstructive sleep apnea 08/29/2007  . GAIT DISTURBANCE 08/14/2007  . Depression 08/26/2006  . Essential hypertension 08/26/2006  . Primary osteoarthritis of both hands 08/26/2006  . Urinary incontinence 08/26/2006    Past Medical History:  Diagnosis Date  . Anxiety   . Breast mass,  left   . Depression   . Gait disturbance   . Hypertension   . Obesity   . Osteoarthritis   . Rheumatoid arthritis(714.0)   . Sleep apnea    cpap  . Urinary incontinence   . Venous insufficiency       Family History  Problem Relation Age of Onset  . Diabetes Unknown   . Stroke Unknown   . Asthma Brother   . Heart disease Brother   . Coronary artery disease Father   . Skin cancer Father   . Heart disease Father   . Coronary artery disease Brother    Past Surgical History:  Procedure Laterality Date  . ABDOMINAL HYSTERECTOMY    . BLADDER SURGERY     sling  . BLADDER SUSPENSION  2009  . BREAST REDUCTION SURGERY    . COLONOSCOPY    . ESOPHAGOGASTRODUODENOSCOPY    . EYE SURGERY    . REPLACEMENT TOTAL KNEE Left   . TOE SURGERY Left    Left great toe  . TOTAL HIP ARTHROPLASTY    . TOTAL KNEE ARTHROPLASTY Right 12/09/2013   Procedure: Right Total Knee Arthroplasty;  Surgeon: Newt Minion, MD;  Location: [facility];  Service: Orthopedics;  Laterality: Right;  Marland Kitchen VESICOVAGINAL FISTULA CLOSURE W/ TAH     Social History   Social History Narrative  . Not on file    Objective: Vital Signs: BP 125/73 (BP Location: Left Arm, Patient Position: Sitting, Cuff Size: Large)   Pulse 66   Resp 16   Ht 5\' 10"  (1.778 m)   Wt 274 lb 6.4 oz (124.5 kg)   BMI 39.37 kg/m    Physical Exam  Constitutional: She is oriented to person, place, and time. She appears well-developed and well-nourished.  HENT:  Head: Normocephalic and atraumatic.  Eyes: Conjunctivae and EOM are normal.  Neck: Normal range of motion.  Cardiovascular: Normal rate, regular rhythm, normal heart sounds and intact distal pulses.  Pulmonary/Chest: Effort normal and breath sounds normal.  Abdominal: Soft. Bowel sounds are normal.  Lymphadenopathy:    She has no cervical adenopathy.  Neurological: She is alert and oriented to person, place, and time.  Skin: Skin is warm and dry. Capillary refill takes less than 2 seconds.  Psychiatric: She has a normal mood and affect. Her behavior is normal.  Nursing note and vitals reviewed.    Musculoskeletal Exam: C-spine limited ROM.  Thoracic and lumbar spine good ROM.  No midline  spinal tenderness.  No SI joint tenderness.  Shoulder joints, elbow joints, wrist joints, MCPs, PIPs, and DIPs good ROM with no synovitis.  Hip joints, knee joints, ankle joints, MTPs, PIPs, and DIPs good ROM with no synovitis.  No warmth or effusion of knee joints.  lymphedema of bilateral lower extremities.    CDAI Exam: CDAI Score: Not documented Patient Global Assessment: Not documented; Provider Global Assessment: Not documented Swollen: Not documented; Tender: Not documented Joint Exam   Not documented   There is currently no information documented on the homunculus. Go to the Rheumatology activity and complete the homunculus joint exam.  Investigation: No additional findings.  Imaging: Dg Foot Complete Left  Result Date: 10/28/2017 Please see detailed radiograph report in office note.  Dg Foot Complete Right  Result Date: 10/28/2017 Please see detailed radiograph report in office note.   Recent Labs: Lab Results  Component Value Date   WBC 4.7 03/13/2017   HGB 12.6 03/13/2017   PLT 257 03/13/2017   NA  141 07/25/2017   K 4.0 07/25/2017   CL 105 07/25/2017   CO2 29 07/25/2017   GLUCOSE 87 07/25/2017   BUN 14 07/25/2017   CREATININE 0.80 07/25/2017   BILITOT 0.8 03/13/2017   ALKPHOS 96 12/11/2016   AST 14 03/13/2017   ALT 10 03/13/2017   PROT 7.2 03/13/2017   ALBUMIN 4.4 12/11/2016   CALCIUM 9.9 07/25/2017   GFRAA 87 03/13/2017    Speciality Comments: PLQ eye exam:09/19/17 WNL @ [facility]  Procedures:  No procedures performed Allergies: Penicillins   Assessment / Plan:     Visit Diagnoses: Autoimmune disease ([place]) - +ANA, +RNP: She has not had any recent autoimmune flares.  She is clinically doing well on PLQ 200 mg 1 tablet by mouth BID.  She has no synovitis on exam.  She continues to have pain in both hands and reports intermittent joint swelling.  She has no oral or nasal ulcerations on exam.  She continues to have sicca symptoms but she has no  parotid swelling on exam.  She does not have any digital ulcerations or digital ulcerations.  She has not had any recent shortness of breath or palpitations.  She has chronic fatigue but no low grade fevers or cervical lymphadenopathy.  She will continue on PLQ 200 mg 1 tablet BID.  Labs will be ordered today.  A refill of PLQ was sent to the pharmacy.  She was advised to notify us if she develops new or worsening symptoms. - Plan: Urinalysis, Routine w reflex microscopic, Anti-DNA antibody, double-stranded, C3 and C4, Sedimentation rate  High risk medication use -  PLQ 200 mg by mouth twice a day.PLQ eye exam: 02/01/17 -CBC and CMP were drawn today to monitor drug toxicity. Plan: CBC with Differential/Platelet, COMPLETE METABOLIC PANEL WITH GFR  Primary osteoarthritis of both hands: She has PIP and DIP synovial thickening.  She has complete fist formation.  She has no synovitis on exam. Joint protection and muscle strengthening were discussed.   H/O total knee replacement, bilateral: Good ROM.  No warmth or effusion.  She has no discomfort at this time.   Primary osteoarthritis of left hip: Good ROM with no discomfort.   Vitamin D deficiency  History of macular degeneration  History of depression  History of sleep apnea  Other fatigue   Orders: Orders Placed This Encounter  Procedures  . CBC with Differential/Platelet  . COMPLETE METABOLIC PANEL WITH GFR  . Urinalysis, Routine w reflex microscopic  . Anti-DNA antibody, double-stranded  . C3 and C4  . Sedimentation rate   Meds ordered this encounter  Medications  . hydroxychloroquine (PLAQUENIL) 200 MG tablet    Sig: Take 1 tablet (200 mg total) by mouth 2 (two) times daily.    Dispense:  180 tablet    Refill:  0     Follow-Up Instructions: Return in about 5 months (around 04/03/2018) for Autoimmune Disease, Osteoarthritis.   Ofilia Neas, PA-C  Note - This record has been created using Dragon software.  Chart creation  errors have been sought, but may not always  have been located. Such creation errors do not reflect on  the standard of medical care.

## 2017-10-31 ENCOUNTER — Ambulatory Visit: Payer: Medicare HMO | Admitting: Physician Assistant

## 2017-11-01 ENCOUNTER — Encounter: Payer: Self-pay | Admitting: Physician Assistant

## 2017-11-01 ENCOUNTER — Ambulatory Visit (INDEPENDENT_AMBULATORY_CARE_PROVIDER_SITE_OTHER): Payer: Medicare HMO | Admitting: Physician Assistant

## 2017-11-01 VITALS — BP 125/73 | HR 66 | Resp 16 | Ht 70.0 in | Wt 274.4 lb

## 2017-11-01 DIAGNOSIS — D8989 Other specified disorders involving the immune mechanism, not elsewhere classified: Secondary | ICD-10-CM

## 2017-11-01 DIAGNOSIS — M170 Bilateral primary osteoarthritis of knee: Secondary | ICD-10-CM | POA: Diagnosis not present

## 2017-11-01 DIAGNOSIS — Z8659 Personal history of other mental and behavioral disorders: Secondary | ICD-10-CM

## 2017-11-01 DIAGNOSIS — Z79899 Other long term (current) drug therapy: Secondary | ICD-10-CM

## 2017-11-01 DIAGNOSIS — Z96653 Presence of artificial knee joint, bilateral: Secondary | ICD-10-CM

## 2017-11-01 DIAGNOSIS — E559 Vitamin D deficiency, unspecified: Secondary | ICD-10-CM

## 2017-11-01 DIAGNOSIS — M19041 Primary osteoarthritis, right hand: Secondary | ICD-10-CM | POA: Diagnosis not present

## 2017-11-01 DIAGNOSIS — Z8669 Personal history of other diseases of the nervous system and sense organs: Secondary | ICD-10-CM

## 2017-11-01 DIAGNOSIS — R69 Illness, unspecified: Secondary | ICD-10-CM | POA: Diagnosis not present

## 2017-11-01 DIAGNOSIS — M1612 Unilateral primary osteoarthritis, left hip: Secondary | ICD-10-CM

## 2017-11-01 DIAGNOSIS — M359 Systemic involvement of connective tissue, unspecified: Secondary | ICD-10-CM

## 2017-11-01 DIAGNOSIS — R5383 Other fatigue: Secondary | ICD-10-CM

## 2017-11-01 DIAGNOSIS — Z8639 Personal history of other endocrine, nutritional and metabolic disease: Secondary | ICD-10-CM | POA: Diagnosis not present

## 2017-11-01 DIAGNOSIS — M19042 Primary osteoarthritis, left hand: Secondary | ICD-10-CM

## 2017-11-01 MED ORDER — HYDROXYCHLOROQUINE SULFATE 200 MG PO TABS: 200.0000 mg | ORAL_TABLET | Freq: Two times a day (BID) | ORAL | 0 refills | Status: DC

## 2017-11-04 LAB — COMPLETE METABOLIC PANEL WITH GFR
AG Ratio: 1.5 (calc) (ref 1.0–2.5)
ALBUMIN MSPROF: 4.3 g/dL (ref 3.6–5.1)
ALT: 12 U/L (ref 6–29)
AST: 16 U/L (ref 10–35)
Alkaline phosphatase (APISO): 106 U/L (ref 33–130)
BUN: 16 mg/dL (ref 7–25)
CALCIUM: 10 mg/dL (ref 8.6–10.4)
CO2: 29 mmol/L (ref 20–32)
CREATININE: 0.78 mg/dL (ref 0.60–0.93)
Chloride: 104 mmol/L (ref 98–110)
GFR, EST AFRICAN AMERICAN: 87 mL/min/{1.73_m2} (ref >=60)
GFR, EST NON AFRICAN AMERICAN: 75 mL/min/{1.73_m2} (ref >=60)
Globulin: 2.8 g/dL (calc) (ref 1.9–3.7)
Glucose, Bld: 79 mg/dL (ref 65–99)
Potassium: 3.9 mmol/L (ref 3.5–5.3)
Sodium: 142 mmol/L (ref 135–146)
TOTAL PROTEIN: 7.1 g/dL (ref 6.1–8.1)
Total Bilirubin: 0.8 mg/dL (ref 0.2–1.2)

## 2017-11-04 LAB — URINALYSIS, ROUTINE W REFLEX MICROSCOPIC
Bilirubin Urine: NEGATIVE
GLUCOSE, UA: NEGATIVE
HGB URINE DIPSTICK: NEGATIVE
Ketones, ur: NEGATIVE
LEUKOCYTES UA: NEGATIVE
NITRITE: NEGATIVE
PROTEIN: NEGATIVE
Specific Gravity, Urine: 1.025 (ref 1.001–1.03)
pH: 5.5 (ref 5.0–8.0)

## 2017-11-04 LAB — CBC WITH DIFFERENTIAL/PLATELET
BASOS PCT: 0.9 %
Basophils Absolute: 41 cells/uL (ref 0–200)
Eosinophils Absolute: 203 cells/uL (ref 15–500)
Eosinophils Relative: 4.5 %
HCT: 38.9 % (ref 35.0–45.0)
HEMOGLOBIN: 13 g/dL (ref 11.7–15.5)
Lymphs Abs: 1206 cells/uL (ref 850–3900)
MCH: 28.9 pg (ref 27.0–33.0)
MCHC: 33.4 g/dL (ref 32.0–36.0)
MCV: 86.4 fL (ref 80.0–100.0)
MONOS PCT: 7.6 %
MPV: 11.1 fL (ref 7.5–12.5)
NEUTROS ABS: 2709 {cells}/uL (ref 1500–7800)
Neutrophils Relative %: 60.2 %
Platelets: 278 10*3/uL (ref 140–400)
RBC: 4.5 10*6/uL (ref 3.80–5.10)
RDW: 11.8 % (ref 11.0–15.0)
TOTAL LYMPHOCYTE: 26.8 %
WBC: 4.5 10*3/uL (ref 3.8–10.8)
WBCMIX: 342 {cells}/uL (ref 200–950)

## 2017-11-04 LAB — ANTI-DNA ANTIBODY, DOUBLE-STRANDED: ds DNA Ab: <1 IU/mL

## 2017-11-04 LAB — C3 AND C4
C3 Complement: 143 mg/dL (ref 83–193)
C4 Complement: 37 mg/dL (ref 15–57)

## 2017-11-04 LAB — SEDIMENTATION RATE: Sed Rate: 2 mm/h (ref 0–30)

## 2017-11-04 NOTE — Progress Notes (Signed)
All labs are WNL

## 2017-11-28 DIAGNOSIS — H401132 Primary open-angle glaucoma, bilateral, moderate stage: Secondary | ICD-10-CM | POA: Diagnosis not present

## 2017-11-28 DIAGNOSIS — H04123 Dry eye syndrome of bilateral lacrimal glands: Secondary | ICD-10-CM | POA: Diagnosis not present

## 2018-01-06 DIAGNOSIS — R69 Illness, unspecified: Secondary | ICD-10-CM | POA: Diagnosis not present

## 2018-01-22 ENCOUNTER — Encounter: Payer: Self-pay | Admitting: Pulmonary Disease

## 2018-01-22 ENCOUNTER — Ambulatory Visit (INDEPENDENT_AMBULATORY_CARE_PROVIDER_SITE_OTHER): Payer: Medicare HMO | Admitting: Pulmonary Disease

## 2018-01-22 DIAGNOSIS — G4733 Obstructive sleep apnea (adult) (pediatric): Secondary | ICD-10-CM | POA: Diagnosis not present

## 2018-01-22 DIAGNOSIS — I10 Essential (primary) hypertension: Secondary | ICD-10-CM | POA: Diagnosis not present

## 2018-01-22 NOTE — Addendum Note (Signed)
Addended by: Elton Sin on: 01/22/2018 12:38 PM   Modules accepted: Orders

## 2018-01-22 NOTE — Patient Instructions (Signed)
Change CPAP settings to auto 12 to 17 cm. Okay to nap for 30 to 60 minutes every afternoon using CPAP.  CPAP supplies will be renewed for a year

## 2018-01-22 NOTE — Assessment & Plan Note (Signed)
Appears well controlled ?

## 2018-01-22 NOTE — Progress Notes (Signed)
   Subjective:    Patient ID: Taylor Mccall, female    DOB: 09-09-1945, 72 y.o.   MRN: [id number]  HPI   72 year old female for FU of  obstructive sleep apnea PMH- rheumatoid arthritis on Plaquenil  She had few residuals with AHI of 6/hour on 15 cm, hence on her last visit CPAP was increased to 17 cm.  Download this time shows good control of events with no residual AHI of 3/hour with minimal leak.  Her compliance seems to have dropped off.  She reports that occasionally she will fall asleep and forget to use her machine.  She then feels sleepy during the daytime and is often napping. She uses a walker due to poor balance and has had a couple of falls  She feels that the machine is not putting out enough pressure even though it is set at 17 cm.  She would like new supplies.  She complains of extreme dryness of mouth in spite of turning the humidity up, uses a full facemask  Significant tests/ events reviewed  NPSG 2009: AHI 21/hr.  Auto 2014: Optimal pressure 15cm   Review of Systems neg for any significant sore throat, dysphagia, itching, sneezing, nasal congestion or excess/ purulent secretions, fever, chills, sweats, unintended wt loss, pleuritic or exertional cp, hempoptysis, orthopnea pnd or change in chronic leg swelling. Also denies presyncope, palpitations, heartburn, abdominal pain, nausea, vomiting, diarrhea or change in bowel or urinary habits, dysuria,hematuria, rash, arthralgias, visual complaints, headache, numbness weakness or ataxia.     Objective:   Physical Exam  Gen. Pleasant,elderly, obese, in no distress ENT - no lesions, no post nasal drip Neck: No JVD, no thyromegaly, no carotid bruits Lungs: no use of accessory muscles, no dullness to percussion, decreased without rales or rhonchi  Cardiovascular: Rhythm regular, heart sounds  normal, no murmurs or gallops, no peripheral edema Musculoskeletal: No deformities, no cyanosis or clubbing , no tremors       Assessment & Plan:

## 2018-01-22 NOTE — Assessment & Plan Note (Signed)
Extreme dryness of mouth may be related to high pressure Change CPAP settings to auto 12 to 17 cm. Okay to nap for 30 to 60 minutes every afternoon using CPAP.  CPAP supplies will be renewed for a year Weight loss encouraged, compliance with goal of at least 4-6 hrs every night is the expectation. Advised against medications with sedative side effects Cautioned against driving when sleepy - understanding that sleepiness will vary on a day to day basis

## 2018-02-11 ENCOUNTER — Encounter: Payer: Self-pay | Admitting: Adult Health

## 2018-02-11 ENCOUNTER — Encounter: Payer: Self-pay | Admitting: Family Medicine

## 2018-02-11 ENCOUNTER — Ambulatory Visit (INDEPENDENT_AMBULATORY_CARE_PROVIDER_SITE_OTHER): Payer: Medicare HMO | Admitting: Adult Health

## 2018-02-11 VITALS — BP 132/80 | Temp 98.0°F | Ht 68.0 in | Wt 271.0 lb

## 2018-02-11 DIAGNOSIS — M359 Systemic involvement of connective tissue, unspecified: Secondary | ICD-10-CM | POA: Diagnosis not present

## 2018-02-11 DIAGNOSIS — G4733 Obstructive sleep apnea (adult) (pediatric): Secondary | ICD-10-CM

## 2018-02-11 DIAGNOSIS — I10 Essential (primary) hypertension: Secondary | ICD-10-CM

## 2018-02-11 DIAGNOSIS — Z0000 Encounter for general adult medical examination without abnormal findings: Secondary | ICD-10-CM

## 2018-02-11 DIAGNOSIS — I890 Lymphedema, not elsewhere classified: Secondary | ICD-10-CM | POA: Diagnosis not present

## 2018-02-11 DIAGNOSIS — K219 Gastro-esophageal reflux disease without esophagitis: Secondary | ICD-10-CM

## 2018-02-11 LAB — LIPID PANEL
Cholesterol: 176 mg/dL (ref 0–200)
HDL: 62.4 mg/dL (ref >39.00)
LDL CALC: 100 mg/dL — AB (ref 0–99)
NonHDL: 113.51
Total CHOL/HDL Ratio: 3
Triglycerides: 66 mg/dL (ref 0.0–149.0)
VLDL: 13.2 mg/dL (ref 0.0–40.0)

## 2018-02-11 LAB — COMPREHENSIVE METABOLIC PANEL
ALT: 10 U/L (ref 0–35)
AST: 13 U/L (ref 0–37)
Albumin: 4 g/dL (ref 3.5–5.2)
Alkaline Phosphatase: 107 U/L (ref 39–117)
BUN: 14 mg/dL (ref 6–23)
CALCIUM: 9.5 mg/dL (ref 8.4–10.5)
CO2: 28 mEq/L (ref 19–32)
CREATININE: 0.75 mg/dL (ref 0.40–1.20)
Chloride: 105 mEq/L (ref 96–112)
GFR: 97.05 mL/min (ref >60.00)
Glucose, Bld: 75 mg/dL (ref 70–99)
Potassium: 3.8 mEq/L (ref 3.5–5.1)
SODIUM: 140 meq/L (ref 135–145)
Total Bilirubin: 0.8 mg/dL (ref 0.2–1.2)
Total Protein: 6.5 g/dL (ref 6.0–8.3)

## 2018-02-11 LAB — CBC WITH DIFFERENTIAL/PLATELET
Basophils Absolute: 0.1 10*3/uL (ref 0.0–0.1)
Basophils Relative: 1.7 % (ref 0.0–3.0)
EOS ABS: 0.2 10*3/uL (ref 0.0–0.7)
Eosinophils Relative: 5.5 % — ABNORMAL HIGH (ref 0.0–5.0)
HCT: 36.6 % (ref 36.0–46.0)
Hemoglobin: 12.3 g/dL (ref 12.0–15.0)
LYMPHS ABS: 1 10*3/uL (ref 0.7–4.0)
Lymphocytes Relative: 24.9 % (ref 12.0–46.0)
MCHC: 33.7 g/dL (ref 30.0–36.0)
MCV: 87.3 fl (ref 78.0–100.0)
Monocytes Absolute: 0.3 10*3/uL (ref 0.1–1.0)
Monocytes Relative: 7.9 % (ref 3.0–12.0)
NEUTROS PCT: 60 % (ref 43.0–77.0)
Neutro Abs: 2.4 10*3/uL (ref 1.4–7.7)
Platelets: 242 10*3/uL (ref 150.0–400.0)
RBC: 4.19 Mil/uL (ref 3.87–5.11)
RDW: 13.2 % (ref 11.5–15.5)
WBC: 4 10*3/uL (ref 4.0–10.5)

## 2018-02-11 LAB — TSH: TSH: 3.6 u[IU]/mL (ref 0.35–4.50)

## 2018-02-11 MED ORDER — POTASSIUM CHLORIDE CRYS ER 20 MEQ PO TBCR: 40.0000 meq | EXTENDED_RELEASE_TABLET | Freq: Every morning | ORAL | 3 refills | Status: DC

## 2018-02-11 MED ORDER — FUROSEMIDE 20 MG PO TABS: 20.0000 mg | ORAL_TABLET | Freq: Two times a day (BID) | ORAL | 3 refills | Status: DC

## 2018-02-11 MED ORDER — PANTOPRAZOLE SODIUM 40 MG PO TBEC: DELAYED_RELEASE_TABLET | ORAL | 3 refills | Status: DC

## 2018-02-11 MED ORDER — LOSARTAN POTASSIUM 25 MG PO TABS: 25.0000 mg | ORAL_TABLET | Freq: Every day | ORAL | 3 refills | Status: DC

## 2018-02-11 MED ORDER — SERTRALINE HCL 100 MG PO TABS: 100.0000 mg | ORAL_TABLET | Freq: Every morning | ORAL | 3 refills | Status: DC

## 2018-02-11 NOTE — Progress Notes (Signed)
Subjective:    Patient ID: Taylor Mccall, female    DOB: 1945-03-18, 73 y.o.   MRN: [id number]  HPI Patient presents for yearly preventative medicine examination. She is a pleasant 73 year old female who  has a past medical history of Anxiety, Breast mass, left, Depression, Gait disturbance, Hypertension, Obesity, Osteoarthritis, Rheumatoid arthritis(714.0), Sleep apnea, Urinary incontinence, and Venous insufficiency.   Essential Hypertension - takes Lasix and Cozaar BP Readings from Last 3 Encounters:  02/11/18 132/80  01/22/18 130/70  11/01/17 125/73   GERD - controlled with Protonix 40 mg. She takes Plaquenil for autoimmune disease which causes her a lot of GI upset without protonix   Hypokalemia - from lasix use. Takes K dur 40 meq daily.   Sleep Dysfunction - takes Zoloft 100 mg QHS.   OSA - she uses sleep pap irregularly.   Lymphedema. Was seen by Vascular surgery in August 2019 when she was diagnosed. She was supposed to be referred to lymphedema clinic but never heard anything from them. She is interested in going.   Autoimmune disease/osteoarthritis + ANA & RNP. Has pain in both hands. Continues to complain of chronic fatigue. Is followed by Rheumatology closely.   All immunizations and health maintenance protocols were reviewed with the patient and needed orders were placed. utd  Appropriate screening laboratory values were ordered for the patient including screening of hyperlipidemia, renal function and hepatic function.  Medication reconciliation,  past medical history, social history, problem list and allergies were reviewed in detail with the patient  Goals were established with regard to weight loss, exercise, and  diet in compliance with medications  End of life planning was discussed.  She gets regular eye exams d/t macular degeneration and being on Plaquenil. Also up to date on dental exams, mammogram and screening colonoscopies    Review of Systems    Constitutional: Positive for fatigue.  HENT: Negative.   Respiratory: Negative.   Cardiovascular: Positive for leg swelling.  Gastrointestinal: Negative.   Endocrine: Negative.   Genitourinary: Negative.   Musculoskeletal: Positive for arthralgias and gait problem.  Skin: Negative.   Allergic/Immunologic: Negative.   Hematological: Negative.   Psychiatric/Behavioral: Negative.    Past Medical History:  Diagnosis Date  . Anxiety   . Breast mass, left   . Depression   . Gait disturbance   . Hypertension   . Obesity   . Osteoarthritis   . Rheumatoid arthritis(714.0)   . Sleep apnea    cpap  . Urinary incontinence   . Venous insufficiency     Social History   Socioeconomic History  . Marital status: Widowed    Spouse name: Not on file  . Number of children: Not on file  . Years of education: Not on file  . Highest education level: Not on file  Occupational History  . Not on file  Social Needs  . Financial resource strain: Not on file  . Food insecurity:    Worry: Not on file    Inability: Not on file  . Transportation needs:    Medical: Not on file    Non-medical: Not on file  Tobacco Use  . Smoking status: Former Smoker    Packs/day: 0.30    Years: 10.00    Pack years: 3.00    Types: Cigarettes    Last attempt to quit: 02/05/1978    Years since quitting: 40.0  . Smokeless tobacco: Never Used  . Tobacco comment: over 25 years ago...pt doesnt remember when  she quit.   Substance and Sexual Activity  . Alcohol use: No  . Drug use: No  . Sexual activity: Not on file  Lifestyle  . Physical activity:    Days per week: Not on file    Minutes per session: Not on file  . Stress: Not on file  Relationships  . Social connections:    Talks on phone: Not on file    Gets together: Not on file    Attends religious service: Not on file    Active member of club or organization: Not on file    Attends meetings of clubs or organizations: Not on file    Relationship  status: Not on file  . Intimate partner violence:    Fear of current or ex partner: Not on file    Emotionally abused: Not on file    Physically abused: Not on file    Forced sexual activity: Not on file  Other Topics Concern  . Not on file  Social History Narrative  . Not on file    Past Surgical History:  Procedure Laterality Date  . ABDOMINAL HYSTERECTOMY    . BLADDER SURGERY     sling  . BLADDER SUSPENSION  2009  . BREAST REDUCTION SURGERY    . COLONOSCOPY    . ESOPHAGOGASTRODUODENOSCOPY    . EYE SURGERY    . REPLACEMENT TOTAL KNEE Left   . TOE SURGERY Left    Left great toe  . TOTAL HIP ARTHROPLASTY    . TOTAL KNEE ARTHROPLASTY Right 12/09/2013   Procedure: Right Total Knee Arthroplasty;  Surgeon: Newt Minion, MD;  Location: [facility];  Service: Orthopedics;  Laterality: Right;  Marland Kitchen VESICOVAGINAL FISTULA CLOSURE W/ TAH      Family History  Problem Relation Age of Onset  . Diabetes Unknown   . Stroke Unknown   . Asthma Brother   . Heart disease Brother   . Coronary artery disease Father   . Skin cancer Father   . Heart disease Father   . Coronary artery disease Brother     Allergies  Allergen Reactions  . Penicillins Rash    Current Outpatient Medications on File Prior to Visit  Medication Sig Dispense Refill  . aspirin 81 MG tablet Take 81 mg by mouth daily.    . carboxymethylcellulose (REFRESH TEARS) 0.5 % SOLN Place 2 drops into the right eye as needed (for itchy eyes).    Marland Kitchen erythromycin (ERY-TAB) 500 MG EC tablet Take 500-1,000 mg by mouth 2 (two) times daily as needed (for dental procedures). Take 1000 mg by mouth 1 hour prior to dental appointment and take 500 mg by mouth 6 hours after dental work.    . hydroxychloroquine (PLAQUENIL) 200 MG tablet Take 1 tablet (200 mg total) by mouth 2 (two) times daily. 180 tablet 0  . Multiple Vitamins-Minerals (PRESERVISION AREDS 2) CAPS Take 2 capsules by mouth daily.    . naproxen sodium (ANAPROX) 220 MG tablet Take  220 mg by mouth 2 (two) times daily as needed (for pain).    . NON FORMULARY 1 each by Other route See admin instructions. Use CPAP nightly.    . Omega-3 Fatty Acids (FISH OIL) 600 MG CAPS Take 1 capsule by mouth 2 (two) times daily.    . TRAVATAN Z 0.004 % SOLN ophthalmic solution INSTILL 1 DROP INTO EACH EYE ONCE DAILY AT NIGHT  4  . vitamin B-12 (CYANOCOBALAMIN) 1000 MCG tablet Take 1,000 mcg by mouth daily.    Marland Kitchen  vitamin E 400 UNIT capsule Take 400 Units by mouth daily.     No current facility-administered medications on file prior to visit.     BP 132/80   Temp 98 F (36.7 C)   Ht 5\' 8"  (1.727 m) Comment: WITHOUT SHOES  Wt 271 lb (122.9 kg)   BMI 41.21 kg/m       Objective:   Physical Exam Vitals signs and nursing note reviewed.  Constitutional:      Appearance: Normal appearance.  HENT:     Head: Normocephalic and atraumatic.     Right Ear: Tympanic membrane, ear canal and external ear normal. There is no impacted cerumen.     Left Ear: Tympanic membrane, ear canal and external ear normal. There is no impacted cerumen.     Nose: Nose normal. No congestion or rhinorrhea.     Mouth/Throat:     Mouth: Mucous membranes are moist.     Pharynx: Oropharynx is clear.  Eyes:     Extraocular Movements: Extraocular movements intact.     Conjunctiva/sclera: Conjunctivae normal.     Pupils: Pupils are equal, round, and reactive to light.  Neck:     Musculoskeletal: Normal range of motion and neck supple.  Cardiovascular:     Rate and Rhythm: Normal rate. Rhythm irregular.     Pulses: Normal pulses.     Heart sounds: Normal heart sounds. No murmur. No friction rub. No gallop.   Pulmonary:     Effort: Pulmonary effort is normal. No respiratory distress.     Breath sounds: Normal breath sounds. No stridor. No wheezing, rhonchi or rales.  Chest:     Chest wall: No tenderness.  Abdominal:     General: Abdomen is flat.  Musculoskeletal:     Right lower leg: Edema present.      Left lower leg: Edema present.     Comments: Walks with wheelchair in the office   Skin:    General: Skin is warm and dry.     Coloration: Skin is not jaundiced or pale.     Findings: No bruising, erythema, lesion or rash.  Neurological:     General: No focal deficit present.     Mental Status: She is alert and oriented to person, place, and time. Mental status is at baseline.  Psychiatric:        Mood and Affect: Mood normal.        Behavior: Behavior normal.        Thought Content: Thought content normal.        Judgment: Judgment normal.       Assessment & Plan:  1. Routine general medical examination at a health care facility - One year follow up or sooner if needed - Comprehensive metabolic panel - Lipid panel - TSH - CBC with Differential/Platelet  2. Autoimmune disease ([place]) - Continue with follow up with Rheumatology   3. Essential hypertension - At goal. No change in medications  - potassium chloride SA (K-DUR,KLOR-CON) 20 MEQ tablet; Take 2 tablets (40 mEq total) by mouth every morning.  Dispense: 180 tablet; Refill: 3 - losartan (COZAAR) 25 MG tablet; Take 1 tablet (25 mg total) by mouth daily.  Dispense: 90 tablet; Refill: 3 - furosemide (LASIX) 20 MG tablet; Take 1 tablet (20 mg total) by mouth 2 (two) times daily. 1 by mouth twice a day  Dispense: 180 tablet; Refill: 3 - Comprehensive metabolic panel - Lipid panel - TSH - CBC with Differential/Platelet  4. Obstructive  sleep apnea - Encouraged to use C-pap nightly.   5. Gastroesophageal reflux disease without esophagitis - Continue with protonix   6. Lymphedema - No skin break down. Wearing compression socks  - Ambulatory referral to Physical Therapy  Dorothyann Peng, NP

## 2018-02-13 ENCOUNTER — Telehealth: Payer: Self-pay | Admitting: *Deleted

## 2018-02-13 NOTE — Telephone Encounter (Signed)
Neoma Laming or [facility], are either of you able to help in trying to get the patient into one of the two requested Clinics per Dorothyann Peng, NP  Copied from [place] [phone]. Topic: Referral - Question >> Feb 13, 2018  9:08 AM Margot Ables wrote: Reason for CRM: Rose notes that  [facility] cannot accept the patient into their program without a cancer diagnosis or history of cancer. She states that [facility] have programs that may accept the patient.

## 2018-02-14 NOTE — Telephone Encounter (Signed)
The [facility] Lymphedema clinic will not accept her because she does not have cancer.   We can try getting her into the clinic at [facility] in [place], or at [facility]  or [facility] in [place]

## 2018-03-05 DIAGNOSIS — Z1231 Encounter for screening mammogram for malignant neoplasm of breast: Secondary | ICD-10-CM | POA: Diagnosis not present

## 2018-03-05 LAB — HM MAMMOGRAPHY

## 2018-03-12 ENCOUNTER — Encounter (INDEPENDENT_AMBULATORY_CARE_PROVIDER_SITE_OTHER): Payer: Self-pay | Admitting: Family

## 2018-03-12 ENCOUNTER — Ambulatory Visit (INDEPENDENT_AMBULATORY_CARE_PROVIDER_SITE_OTHER): Payer: Medicare HMO

## 2018-03-12 ENCOUNTER — Ambulatory Visit (INDEPENDENT_AMBULATORY_CARE_PROVIDER_SITE_OTHER): Payer: Self-pay

## 2018-03-12 ENCOUNTER — Ambulatory Visit (INDEPENDENT_AMBULATORY_CARE_PROVIDER_SITE_OTHER): Payer: Medicare HMO | Admitting: Family

## 2018-03-12 VITALS — Ht 68.0 in | Wt 271.0 lb

## 2018-03-12 DIAGNOSIS — M25562 Pain in left knee: Secondary | ICD-10-CM

## 2018-03-12 DIAGNOSIS — Z96653 Presence of artificial knee joint, bilateral: Secondary | ICD-10-CM | POA: Diagnosis not present

## 2018-03-12 DIAGNOSIS — M25561 Pain in right knee: Secondary | ICD-10-CM

## 2018-03-12 NOTE — Progress Notes (Signed)
Office Visit Note   Patient: Taylor Mccall           Date of Birth: 05/24/1945           MRN: [id number] Visit Date: 03/12/2018              Requested by: Dorothyann Peng, NP [place], [place] [address] PCP: Dorothyann Peng, NP  Chief Complaint  Patient presents with  . Left Knee - Pain    Left TKR 2015 and right knee by another doctor years before that pt is unsure of name.   . Right Knee - Pain      HPI: The patient is a 73 year old woman who presents today complaining of bilateral knee pain.  No new injury.  States this began last week on Friday she could hardly walk.  She felt it might of been related to the cool weather and snow flurries that we got last Friday.  The left knee hurts worse than the right.  No mechanical symptoms no falls.  She is status post bilateral total knee replacements most recently the right knee was done by Dr. Sharol Given in 2015.  Some swelling to the bilateral lower extremities does wear compression garments daily for these.  No warmth. No fever or chills.   Assessment & Plan: Visit Diagnoses:  1. Acute pain of both knees   2. H/O total knee replacement, bilateral     Plan: recommended quad strengthening. [facility]. Patient in agreement with plan. Aleve bid x 2 weeks then prn.   Follow-Up Instructions: Return in about 4 weeks (around 04/09/2018).   Right Knee Exam   Muscle Strength  The patient has normal right knee strength.  Tenderness  The patient is experiencing no tenderness.   Tests  Varus: negative Valgus: negative  Other  Erythema: absent Swelling: none   Left Knee Exam   Muscle Strength  The patient has normal left knee strength.  Tenderness  The patient is experiencing tenderness in the medial joint line and lateral joint line.  Range of Motion  Flexion: 80   Tests  Varus: negative Valgus: negative  Other  Erythema: absent Swelling: moderate      Patient is  alert, oriented, no adenopathy, well-dressed, normal affect, normal respiratory effort.   Imaging: No results found. No images are attached to the encounter.  Labs: Lab Results  Component Value Date   HGBA1C 5.0 08/15/2016   ESRSEDRATE 2 11/01/2017   ESRSEDRATE 9 03/13/2017   ESRSEDRATE 6 02/29/2016   LABURIC 4.2 01/19/2008   LABORGA ESCHERICHIA COLI 07/28/2014     Lab Results  Component Value Date   ALBUMIN 4.0 02/11/2018   ALBUMIN 4.4 12/11/2016   ALBUMIN 4.4 08/15/2016   LABURIC 4.2 01/19/2008    Body mass index is 41.21 kg/m.  Orders:  Orders Placed This Encounter  Procedures  . XR Knee 1-2 Views Right  . XR Knee 1-2 Views Left  . Ambulatory referral to Physical Therapy   No orders of the defined types were placed in this encounter.    Procedures: No procedures performed  Clinical Data: No additional findings.  ROS:  All other systems negative, except as noted in the HPI. Review of Systems  Constitutional: Negative for chills and fever.  Musculoskeletal: Positive for arthralgias, gait problem and joint swelling. Negative for myalgias.    Objective: Vital Signs: Ht 5\' 8"  (1.727 m)   Wt 271 lb (122.9 kg)  BMI 41.21 kg/m   Specialty Comments:  No specialty comments available.  PMFS History: Patient Active Problem List   Diagnosis Date Noted  . Routine general medical examination at a health care facility 12/11/2016  . Primary osteoarthritis of left hip 05/01/2016  . Autoimmune disease ([place]) 11/29/2015  . High risk medication use 11/29/2015  . Vitamin D deficiency 11/29/2015  . Macular degeneration 11/29/2015  . Bilateral lower extremity edema 10/26/2015  . H/O total knee replacement, bilateral 12/09/2013  . Breast mass, left 01/18/2011  . Obesity 12/08/2009  . Venous (peripheral) insufficiency 07/28/2009  . PAIN IN JOINT, ANKLE AND FOOT 01/12/2008  . Obstructive sleep apnea 08/29/2007  . GAIT DISTURBANCE 08/14/2007  . Depression  08/26/2006  . Essential hypertension 08/26/2006  . Primary osteoarthritis of both hands 08/26/2006  . Urinary incontinence 08/26/2006   Past Medical History:  Diagnosis Date  . Anxiety   . Breast mass, left   . Depression   . Gait disturbance   . Hypertension   . Obesity   . Osteoarthritis   . Rheumatoid arthritis(714.0)   . Sleep apnea    cpap  . Urinary incontinence   . Venous insufficiency     Family History  Problem Relation Age of Onset  . Diabetes Unknown   . Stroke Unknown   . Asthma Brother   . Heart disease Brother   . Coronary artery disease Father   . Skin cancer Father   . Heart disease Father   . Coronary artery disease Brother     Past Surgical History:  Procedure Laterality Date  . ABDOMINAL HYSTERECTOMY    . BLADDER SURGERY     sling  . BLADDER SUSPENSION  2009  . BREAST REDUCTION SURGERY    . COLONOSCOPY    . ESOPHAGOGASTRODUODENOSCOPY    . EYE SURGERY    . REPLACEMENT TOTAL KNEE Left   . TOE SURGERY Left    Left great toe  . TOTAL HIP ARTHROPLASTY    . TOTAL KNEE ARTHROPLASTY Right 12/09/2013   Procedure: Right Total Knee Arthroplasty;  Surgeon: Newt Minion, MD;  Location: [facility];  Service: Orthopedics;  Laterality: Right;  Marland Kitchen VESICOVAGINAL FISTULA CLOSURE W/ TAH     Social History   Occupational History  . Not on file  Tobacco Use  . Smoking status: Former Smoker    Packs/day: 0.30    Years: 10.00    Pack years: 3.00    Types: Cigarettes    Last attempt to quit: 02/05/1978    Years since quitting: 40.1  . Smokeless tobacco: Never Used  . Tobacco comment: over 25 years ago...pt doesnt remember when she quit.   Substance and Sexual Activity  . Alcohol use: No  . Drug use: No  . Sexual activity: Not on file

## 2018-03-18 ENCOUNTER — Encounter: Payer: Self-pay | Admitting: Physical Therapy

## 2018-03-18 ENCOUNTER — Ambulatory Visit: Payer: Medicare HMO | Attending: Family | Admitting: Physical Therapy

## 2018-03-18 ENCOUNTER — Other Ambulatory Visit: Payer: Self-pay

## 2018-03-18 DIAGNOSIS — R262 Difficulty in walking, not elsewhere classified: Secondary | ICD-10-CM | POA: Insufficient documentation

## 2018-03-18 DIAGNOSIS — M6281 Muscle weakness (generalized): Secondary | ICD-10-CM | POA: Diagnosis not present

## 2018-03-18 DIAGNOSIS — R2681 Unsteadiness on feet: Secondary | ICD-10-CM

## 2018-03-18 DIAGNOSIS — M25561 Pain in right knee: Secondary | ICD-10-CM | POA: Diagnosis not present

## 2018-03-18 DIAGNOSIS — G8929 Other chronic pain: Secondary | ICD-10-CM | POA: Insufficient documentation

## 2018-03-18 DIAGNOSIS — R252 Cramp and spasm: Secondary | ICD-10-CM | POA: Diagnosis not present

## 2018-03-18 DIAGNOSIS — M25562 Pain in left knee: Secondary | ICD-10-CM | POA: Insufficient documentation

## 2018-03-18 NOTE — Patient Instructions (Signed)
Leg Extension (Hamstring)   Sit toward front edge of chair, with leg out straight, heel on floor, toes pointing toward body. Keeping back straight, bend forward at hip, breathing out through pursed lips. Return, breathing in. Repeat _3__ times. Repeat with other leg. Do _3__ sessions per day. Hold for 30 seconds.  KISS THE BABY      Copyright  VHI. All rights reserved.  HIP: Flexion / KNEE: Extension, Straight Leg Raise   Raise leg, keeping knee straight.  Bend ankle toward your head. Perform slowly. 10___ reps per set, _2__ sets per day, _7__ days per week       Raise leg until knee is straight. _10__ reps per set, __2_ sets per day, __7_ days per week   Quad Set   Slowly tighten muscles on thigh of straight leg while counting out loud to _5___. Repeat with other leg. Repeat 10 x 2____ times. Do ___2_ sessions per day. Taylor Mccall, PT Certified Exercise Expert for the Aging Adult  03/18/18 11:32 AM Phone: [phone] Fax: [phone]

## 2018-03-18 NOTE — Therapy (Signed)
[facility] [place], [place], [address] Phone: [phone]   Fax:  [phone]  Physical Therapy Evaluation  Patient Details  Name: Taylor Mccall MRN: [id number] Date of Birth: 02/11/1945 Referring Provider (PT): Dondra Prader R  NP   Encounter Date: 03/18/2018  PT End of Session - 03/18/18 1238    Visit Number  1    Number of Visits  16    Date for PT Re-Evaluation  05/13/18    Authorization Type  [employer]  KX modifier 15th visit and progress vote every 10ths    PT Start Time  1115   came 15 minu late   PT Stop Time  1147    PT Time Calculation (min)  32 min    Activity Tolerance  Patient limited by pain    Behavior During Therapy  [facility] for tasks assessed/performed       Past Medical History:  Diagnosis Date  . Anxiety   . Breast mass, left   . Depression   . Gait disturbance   . Hypertension   . Obesity   . Osteoarthritis   . Rheumatoid arthritis(714.0)   . Sleep apnea    cpap  . Urinary incontinence   . Venous insufficiency     Past Surgical History:  Procedure Laterality Date  . ABDOMINAL HYSTERECTOMY    . BLADDER SURGERY     sling  . BLADDER SUSPENSION  2009  . BREAST REDUCTION SURGERY    . COLONOSCOPY    . ESOPHAGOGASTRODUODENOSCOPY    . EYE SURGERY    . REPLACEMENT TOTAL KNEE Left   . TOE SURGERY Left    Left great toe  . TOTAL HIP ARTHROPLASTY    . TOTAL KNEE ARTHROPLASTY Right 12/09/2013   Procedure: Right Total Knee Arthroplasty;  Surgeon: Newt Minion, MD;  Location: [facility];  Service: Orthopedics;  Laterality: Right;  Marland Kitchen VESICOVAGINAL FISTULA CLOSURE W/ TAH      There were no vitals filed for this visit.   Subjective Assessment - 03/18/18 1133    Subjective  I have had knee pain for a long time but it has gotten worse over the last month. I could not get up and down from my son's toilet because it does not have an elevated seat    Pertinent History  lymphedema in legs     Limitations  Standing;Walking;Sitting    How long can you sit comfortably?  I get numb. after15 minutes my feet go sleep    How long can you stand comfortably?  < 5 minutes    How long can you walk comfortably?  < 5 minutes lymphedema in legs    Diagnostic tests  See Chart    Patient Stated Goals  I want to be able to walk without rollator    Currently in Pain?  Yes    Pain Score  6     Pain Location  Knee    Pain Orientation  Right    Pain Descriptors / Indicators  Aching;Sore;Sharp    Pain Type  Chronic pain    Pain Onset  More than a month ago    Pain Frequency  Constant    Aggravating Factors   getting up from the chair.  cant sit on low chairs. I have a commode seat but I have to have it to get up. I have difficulty gettting in and out of car, unable to go up stairs  Pain Relieving Factors  nothing    Multiple Pain Sites  Yes    Pain Score  7    Pain Location  Knee    Pain Orientation  Left    Pain Descriptors / Indicators  Aching    Pain Type  Chronic pain    Pain Onset  More than a month ago    Pain Frequency  Constant    Aggravating Factors   as above         [facility] PT Assessment - 03/18/18 1114      Assessment   Medical Diagnosis  Knee pain bil  Old TKR bil    Referring Provider (PT)  Dondra Prader R     Onset Date/Surgical Date  --   recently for about a month exacerbation chronic >10 yr   Hand Dominance  Right    Next MD Visit  not planned    Prior Therapy  Just TKR not recently      Precautions   Precautions  None    Precaution Comments  Pt has a rollator and a cane normally prefers the rollator      Restrictions   Weight Bearing Restrictions  No      Balance Screen   Has the patient fallen in the past 6 months  No    Has the patient had a decrease in activity level because of a fear of falling?   Yes   fearful of falling   Is the patient reluctant to leave their home because of a fear of falling?   Yes   reluctant because of fear of falling      [place]  Other (comment)   lift chair in home to second [place]  Two level      Prior Function   Level of Independence  Independent with household mobility with device    Vocation  Retired    Biomedical scientist  worked [employer]      Cognition   Overall Cognitive Status  Within [employer] for tasks assessed      Observation/Other Assessments   Focus on Therapeutic Outcomes (FOTO)   FOTO intake 30% limitation 70% predicted 58%      Sensation   Additional Comments  lymphedema in bil legs      Functional Tests   Functional tests  Sit to Stand      Sit to Stand   Comments  30.11   must use hands to get up out of chair     Posture/Postural Control   Posture/Postural Control  Postural limitations    Postural Limitations  Anterior pelvic tilt;Forward head;Rounded Shoulders    Posture Comments  obesity      ROM / Strength   AROM / PROM / Strength  AROM;PROM;Strength      AROM   Overall AROM   Deficits    Right Knee Extension  7    Right Knee Flexion  121    Left Knee Extension  2    Left Knee Flexion  109      PROM   Overall PROM   Deficits    Right Knee Extension  5    Right Knee Flexion  127    Left Knee Extension  0    Left Knee Flexion  114  Strength   Overall Strength  Deficits    Right/Left Hip  Right;Left    Right Hip Flexion  4/5    Right Hip Extension  3-/5    Right Hip ABduction  3-/5    Left Hip Flexion  4-/5    Left Hip Extension  4-/5    Left Hip ABduction  3-/5    Right Knee Flexion  4-/5    Right Knee Extension  4-/5    Left Knee Flexion  4-/5    Left Knee Extension  4-/5      Flexibility   Hamstrings  Pt tends to sit all the time.  Hamstrings bil tight about 55 before tightness       Palpation   Palpation comment  have pain in back of bil knee      Ambulation/Gait   Ambulation/Gait  Yes    Gait  Pattern  Lateral trunk lean to right;Decreased hip/knee flexion - right;Decreased hip/knee flexion - left;Decreased arm swing - left;Right flexed knee in stance    Ambulation Surface  Level    Gait velocity  1.95 ft/sec    Stairs  --   does not do stairs due to lift chair in home               Objective measurements completed on examination: See above findings.              PT Education - 03/18/18 1450    Education Details  POC Explanation of findings , Initial HEP    Person(s) Educated  Patient    Methods  Explanation;Demonstration;Tactile cues;Verbal cues;Handout    Comprehension  Verbalized understanding;Returned demonstration       PT Short Term Goals - 03/18/18 1501      PT SHORT TERM GOAL #1   Title  "Independent with initial HEP    Time  4    Period  Weeks    Status  New    Target Date  04/15/18      PT SHORT TERM GOAL #2   Title  "Pt will ambulate with LRAD and without limp    Baseline  Pt uses rollator and a cane with trunk lean and limp    Time  4    Period  Weeks    Status  New    Target Date  04/15/18      PT SHORT TERM GOAL #3   Title  Pt will be able to perform sink squat with chair support in back with pain 4/10 or less    Time  4    Period  Weeks    Status  New    Target Date  04/15/18        PT Long Term Goals - 03/18/18 1140      PT LONG TERM GOAL #1   Title  "Pt will be independent with advanced HEP.     Time  8    Period  Weeks    Status  New    Target Date  05/13/18      PT LONG TERM GOAL #2   Title  Pt will be able to demonstrate 5 X STS in 15 sec to show increased LE strength and decreased fall risk    Baseline  eval 30.11 5 x STS and must use hands to rise from chair    Time  8    Period  Weeks    Status  New    Target Date  05/13/18      PT LONG TERM GOAL #3   Title  "Pt will improve her L/R knee flexion to  </= 120 degrees and extension to </= 5 degrees with </= 2/10 pain for a more functional and efficient  gait pattern    Time  8    Period  Weeks    Status  New    Target Date  05/13/18      PT LONG TERM GOAL #4   Title  "FOTO will improve from 70% limitation   to  58% limitation    indicating improved functional mobility .     Time  8    Period  Weeks    Status  New    Target Date  05/13/18      PT LONG TERM GOAL #5   Title  "Pt will improve L/R knee/hip extensor/flexor strength to >/= 4+/5 with </= 2/10 pain to promote safety with walking/standing activities    Time  8    Period  Weeks    Status  New    Target Date  05/13/18      PT LONG TERM GOAL #6   Title  Pt will be able rise from regular toilet in order to perform toileting independently in public rest rooms for increased mobility in community    Time  8    Period  Weeks    Status  New    Target Date  05/13/18             Plan - 03/18/18 1138    Clinical Impression Statement  73 yo female with multiple co morbiities and obesity and habit of lack of daily movement presents with exacerbation of bil knee pain. ( bil old TKR and left THA) in the last month but a > 10 year history of knee pain.  Pt also has lymphedema in bil legs. Pt cannot rise form chair without UE support.  5 x STS 30.11 sec.  Pt has significant bil LE weakness due to inactivity and unsteadiness on feet.  She use a can and prefers her rollator for mobility.  Ms. Tanicka Bisaillon fear of falling and is at high risk of fall due to 5 x STS test. Pt reports difficulty using regular toilet seats and utilizes modified toilet seat in home.  She would like to be more mobile in the community.  Pt has impairments in AROM, strength, pain in bil knees. and difficulty with transfers, walking, mobility and is presently a high fall risk.  Pt would benefit from skilled PT to address impairments and reduce fall risk.    History and Personal Factors relevant to plan of care:  bil TKR, left hip THA before %73 yo, venous insufficiency, pin in right hip, dislocation as a child  obesity, OSA,  macular degeneration See medical chart fear of falling.     Clinical Presentation  Evolving    Clinical Decision Making  Moderate    Rehab Potential  Good    PT Frequency  2x / week    PT Duration  8 weeks    PT Treatment/Interventions  ADLs/Self Care Home Management;Cryotherapy;Electrical Stimulation;Iontophoresis 4mg /ml Dexamethasone;Moist Heat;Ultrasound;Therapeutic exercise;Therapeutic activities;Functional mobility training;Stair training;Gait training;Neuromuscular re-education;Patient/family education;Manual techniques;Passive range of motion;Dry needling;Taping;Joint Manipulations    PT Next Visit Plan  [place] level 1 knee  use of modalities and manual for pain relief    PT Home Exercise Plan  level 1 knee, sitting hamstring stretch    Consulted and Agree  with Plan of Care  Patient       Patient will benefit from skilled therapeutic intervention in order to improve the following deficits and impairments:  Abnormal gait, Decreased activity tolerance, Decreased endurance, Decreased mobility, Decreased range of motion, Difficulty walking, Decreased strength, Increased edema, Impaired flexibility, Increased muscle spasms, Obesity, Postural dysfunction, Improper body mechanics, Pain  Visit Diagnosis: Chronic pain of left knee  Chronic pain of right knee  Muscle weakness (generalized)  Difficulty in walking, not elsewhere classified  Cramp and spasm  Unsteadiness on feet     Problem List Patient Active Problem List   Diagnosis Date Noted  . Routine general medical examination at a health care facility 12/11/2016  . Primary osteoarthritis of left hip 05/01/2016  . Autoimmune disease ([place]) 11/29/2015  . High risk medication use 11/29/2015  . Vitamin D deficiency 11/29/2015  . Macular degeneration 11/29/2015  . Bilateral lower extremity edema 10/26/2015  . H/O total knee replacement, bilateral 12/09/2013  . Breast mass, left 01/18/2011  . Obesity 12/08/2009   . Venous (peripheral) insufficiency 07/28/2009  . PAIN IN JOINT, ANKLE AND FOOT 01/12/2008  . Obstructive sleep apnea 08/29/2007  . GAIT DISTURBANCE 08/14/2007  . Depression 08/26/2006  . Essential hypertension 08/26/2006  . Primary osteoarthritis of both hands 08/26/2006  . Urinary incontinence 08/26/2006   Voncille Lo, PT Certified Exercise Expert for the Aging Adult  03/18/18 4:02 PM Phone: [phone] Fax: [place] [facility] [address] [place], [place], [address] Phone: [phone]   Fax:  [phone]  Name: Taylor Mccall MRN: [id number] Date of Birth: [birth year]

## 2018-03-19 DIAGNOSIS — R69 Illness, unspecified: Secondary | ICD-10-CM | POA: Diagnosis not present

## 2018-03-20 ENCOUNTER — Encounter: Payer: Self-pay | Admitting: Physical Therapy

## 2018-03-20 ENCOUNTER — Ambulatory Visit: Payer: Medicare HMO | Admitting: Physical Therapy

## 2018-03-20 DIAGNOSIS — R252 Cramp and spasm: Secondary | ICD-10-CM | POA: Diagnosis not present

## 2018-03-20 DIAGNOSIS — H04129 Dry eye syndrome of unspecified lacrimal gland: Secondary | ICD-10-CM | POA: Diagnosis not present

## 2018-03-20 DIAGNOSIS — M25561 Pain in right knee: Secondary | ICD-10-CM | POA: Diagnosis not present

## 2018-03-20 DIAGNOSIS — M1990 Unspecified osteoarthritis, unspecified site: Secondary | ICD-10-CM | POA: Diagnosis not present

## 2018-03-20 DIAGNOSIS — K219 Gastro-esophageal reflux disease without esophagitis: Secondary | ICD-10-CM | POA: Diagnosis not present

## 2018-03-20 DIAGNOSIS — M6281 Muscle weakness (generalized): Secondary | ICD-10-CM

## 2018-03-20 DIAGNOSIS — G8929 Other chronic pain: Secondary | ICD-10-CM

## 2018-03-20 DIAGNOSIS — M069 Rheumatoid arthritis, unspecified: Secondary | ICD-10-CM | POA: Diagnosis not present

## 2018-03-20 DIAGNOSIS — M25562 Pain in left knee: Secondary | ICD-10-CM | POA: Diagnosis not present

## 2018-03-20 DIAGNOSIS — R69 Illness, unspecified: Secondary | ICD-10-CM | POA: Diagnosis not present

## 2018-03-20 DIAGNOSIS — R262 Difficulty in walking, not elsewhere classified: Secondary | ICD-10-CM

## 2018-03-20 DIAGNOSIS — G4730 Sleep apnea, unspecified: Secondary | ICD-10-CM | POA: Diagnosis not present

## 2018-03-20 DIAGNOSIS — R2681 Unsteadiness on feet: Secondary | ICD-10-CM | POA: Diagnosis not present

## 2018-03-20 DIAGNOSIS — H409 Unspecified glaucoma: Secondary | ICD-10-CM | POA: Diagnosis not present

## 2018-03-20 DIAGNOSIS — E669 Obesity, unspecified: Secondary | ICD-10-CM | POA: Diagnosis not present

## 2018-03-20 DIAGNOSIS — I10 Essential (primary) hypertension: Secondary | ICD-10-CM | POA: Diagnosis not present

## 2018-03-20 NOTE — Therapy (Addendum)
[place], [place], [address] Phone: [phone]   Fax:  [phone]  Physical Therapy Treatment  Patient Details  Name: Taylor Mccall MRN: [id number] Date of Birth: January 30, 1946 Referring Provider (PT): Suzan Slick    Encounter Date: 03/20/2018  PT End of Session - 03/20/18 1409    Visit Number  2   Number of Visits  16    Date for PT Re-Evaluation  05/13/18    Authorization Type  [employer]  KX modifier 15th visit and progress vote every 10ths    PT Start Time  1417    PT Stop Time  1457    PT Time Calculation (min)  40 min    Activity Tolerance  Patient tolerated treatment well    Behavior During Therapy  [facility] for tasks assessed/performed       Past Medical History:  Diagnosis Date  . Anxiety   . Breast mass, left   . Depression   . Gait disturbance   . Hypertension   . Obesity   . Osteoarthritis   . Rheumatoid arthritis(714.0)   . Sleep apnea    cpap  . Urinary incontinence   . Venous insufficiency     Past Surgical History:  Procedure Laterality Date  . ABDOMINAL HYSTERECTOMY    . BLADDER SURGERY     sling  . BLADDER SUSPENSION  2009  . BREAST REDUCTION SURGERY    . COLONOSCOPY    . ESOPHAGOGASTRODUODENOSCOPY    . EYE SURGERY    . REPLACEMENT TOTAL KNEE Left   . TOE SURGERY Left    Left great toe  . TOTAL HIP ARTHROPLASTY    . TOTAL KNEE ARTHROPLASTY Right 12/09/2013   Procedure: Right Total Knee Arthroplasty;  Surgeon: Newt Minion, MD;  Location: [facility];  Service: Orthopedics;  Laterality: Right;  Marland Kitchen VESICOVAGINAL FISTULA CLOSURE W/ TAH      There were no vitals filed for this visit.  Subjective Assessment - 03/20/18 1418    Currently in Pain?  Yes    Pain Score  4     Pain Location  Knee    Pain Orientation  Right    Pain Descriptors / Indicators  Aching    Pain Type  Chronic pain    Pain Onset  More than a month ago    Pain Frequency  Constant    Multiple  Pain Sites  Yes    Pain Score  4    Pain Location  Knee    Pain Orientation  Left    Pain Descriptors / Indicators  Aching    Pain Type  Chronic pain    Pain Onset  More than a month ago                       [facility] Adult PT Treatment/Exercise - 03/20/18 1425      Knee/Hip Exercises: Stretches   Active Hamstring Stretch  2 reps;Left;Right;30 seconds    Active Hamstring Stretch Limitations  seated      Knee/Hip Exercises: Aerobic   Nustep  5 minutes level 2        Knee/Hip Exercises: Standing   Other Standing Knee Exercises  sink squats 10 x 2 with ther ex cushion in chair.       Knee/Hip Exercises: Seated   Long Arc Quad Limitations  LAQ 8 x 2 with 5lb wt R and L each  Knee/Hip Exercises: Supine   Quad Sets  20 reps;Left;Right    Quad Sets Limitations  Pt left side with decreased strength    Heel Slides  10 reps;Right    Straight Leg Raises  10 reps;Right;Strengthening    Straight Leg Raises Limitations  8 x on left more difficult to do  fatigues easily      much explanation and added time for correct sequencing and execution.  Pt left leg with edema        PT Education - 03/20/18 1450    Education Details  added squats and supine clams and wall UE slides with LE quad set and hip ext.     Person(s) Educated  Patient    Methods  Explanation;Demonstration;Tactile cues;Verbal cues;Handout    Comprehension  Verbalized understanding;Returned demonstration       PT Short Term Goals - 03/18/18 1501      PT SHORT TERM GOAL #1   Title  "Independent with initial HEP    Time  4    Period  Weeks    Status  New    Target Date  04/15/18      PT SHORT TERM GOAL #2   Title  "Pt will ambulate with LRAD and without limp    Baseline  Pt uses rollator and a cane with trunk lean and limp    Time  4    Period  Weeks    Status  New    Target Date  04/15/18      PT SHORT TERM GOAL #3   Title  Pt will be able to perform sink squat with chair support in back  with pain 4/10 or less    Time  4    Period  Weeks    Status  New    Target Date  04/15/18        PT Long Term Goals - 03/18/18 1140      PT LONG TERM GOAL #1   Title  "Pt will be independent with advanced HEP.     Time  8    Period  Weeks    Status  New    Target Date  05/13/18      PT LONG TERM GOAL #2   Title  Pt will be able to demonstrate 5 X STS in 15 sec to show increased LE strength and decreased fall risk    Baseline  eval 30.11 5 x STS and must use hands to rise from chair    Time  8    Period  Weeks    Status  New    Target Date  05/13/18      PT LONG TERM GOAL #3   Title  "Pt will improve her L/R knee flexion to  </= 120 degrees and extension to </= 5 degrees with </= 2/10 pain for a more functional and efficient gait pattern    Time  8    Period  Weeks    Status  New    Target Date  05/13/18      PT LONG TERM GOAL #4   Title  "FOTO will improve from 70% limitation   to  58% limitation    indicating improved functional mobility .     Time  8    Period  Weeks    Status  New    Target Date  05/13/18      PT LONG TERM GOAL #5   Title  "Pt will improve  L/R knee/hip extensor/flexor strength to >/= 4+/5 with </= 2/10 pain to promote safety with walking/standing activities    Time  8    Period  Weeks    Status  New    Target Date  05/13/18      PT LONG TERM GOAL #6   Title  Pt will be able rise from regular toilet in order to perform toileting independently in public rest rooms for increased mobility in community    Time  8    Period  Weeks    Status  New    Target Date  05/13/18            Plan - 03/20/18 1458    Clinical Impression Statement  Pt came to clinic with 4/10 pain today decreased from eval.  Pt was able to rise from chair with an added Ther Ex cushion using momentum and without using hands.  Pt given added strengthening to HEP . Will continue toward completion of goals.    Rehab Potential  Good    PT Frequency  2x / week    PT  Duration  8 weeks    PT Treatment/Interventions  ADLs/Self Care Home Management;Cryotherapy;Electrical Stimulation;Iontophoresis 4mg /ml Dexamethasone;Moist Heat;Ultrasound;Therapeutic exercise;Therapeutic activities;Functional mobility training;Stair training;Gait training;Neuromuscular re-education;Patient/family education;Manual techniques;Passive range of motion;Dry needling;Taping;Joint Manipulations    PT Next Visit Plan  REview level 1 knee  use of modalities and manual for pain relief UE wall slide and LE quad/hip ext. squat at sink.  supine clams    PT Home Exercise Plan  level 1 knee, sitting hamstring stretch    Consulted and Agree with Plan of Care  Patient       Patient will benefit from skilled therapeutic intervention in order to improve the following deficits and impairments:  Abnormal gait, Decreased activity tolerance, Decreased endurance, Decreased mobility, Decreased range of motion, Difficulty walking, Decreased strength, Increased edema, Impaired flexibility, Increased muscle spasms, Obesity, Postural dysfunction, Improper body mechanics, Pain  Visit Diagnosis: Chronic pain of left knee  Chronic pain of right knee  Muscle weakness (generalized)  Difficulty in walking, not elsewhere classified  Cramp and spasm  Unsteadiness on feet     Problem List Patient Active Problem List   Diagnosis Date Noted  . Routine general medical examination at a health care facility 12/11/2016  . Primary osteoarthritis of left hip 05/01/2016  . Autoimmune disease ([place]) 11/29/2015  . High risk medication use 11/29/2015  . Vitamin D deficiency 11/29/2015  . Macular degeneration 11/29/2015  . Bilateral lower extremity edema 10/26/2015  . H/O total knee replacement, bilateral 12/09/2013  . Breast mass, left 01/18/2011  . Obesity 12/08/2009  . Venous (peripheral) insufficiency 07/28/2009  . PAIN IN JOINT, ANKLE AND FOOT 01/12/2008  . Obstructive sleep apnea 08/29/2007  . GAIT  DISTURBANCE 08/14/2007  . Depression 08/26/2006  . Essential hypertension 08/26/2006  . Primary osteoarthritis of both hands 08/26/2006  . Urinary incontinence 08/26/2006   Voncille Lo, PT Certified Exercise Expert for the Aging Adult  03/20/18 3:03 PM Phone: [phone] Fax: [place] [facility] [address] [place], [place], [address] Phone: [phone]   Fax:  [phone]  Name: Taylor Mccall MRN: [id number] Date of Birth: 20-Jun-1945

## 2018-03-20 NOTE — Patient Instructions (Signed)
       Voncille Lo, PT Certified Exercise Expert for the Aging Adult  03/20/18 2:50 PM Phone: [phone] Fax: [phone]

## 2018-03-20 NOTE — Progress Notes (Signed)
Office Visit Note  Patient: Taylor Mccall             Date of Birth: [birth year]           MRN: [id number]             PCP: Dorothyann Peng, NP Referring: Dorothyann Peng, NP Visit Date: 04/03/2018 Occupation: @GUAROCC @  Subjective:  Right shoulder pain    History of Present Illness: Taylor Mccall is a 73 y.o. female with history of autoimmune disease and osteoarthritis.  She takes PLQ 200 mg BID.  She has not missed any doses recently.  She has not had any signs or symptoms of a flare.  She denies any recent rashes.  She denies any sores in her mouth or nose.  She continues to have chronic sicca symptoms.  She uses over-the-counter products.  She denies any symptoms of [place].  She has chronic fatigue.  She denies any swollen lymph nodes or fever.  She reports that she has been having numbness in both hands associated when typing or talking on the phone.  She states that she experiences the numbness intermittently at night.  She reports she does not sleep well and uses a CPAP.  She reports she is having right shoulder pain but has good range of motion.  She would like a right shoulder cortisone injection today.  She denies any joint swelling.  She has been going to PT for lower extremity muscle strengthening.     Activities of Daily Living:  Patient reports morning stiffness for 30  minutes.   Patient Denies nocturnal pain.  Difficulty dressing/grooming: Denies Difficulty climbing stairs: Reports Difficulty getting out of chair: Reports Difficulty using hands for taps, buttons, cutlery, and/or writing: Denies  Review of Systems  Constitutional: Positive for fatigue.  HENT: Positive for mouth dryness. Negative for mouth sores and nose dryness.   Eyes: Positive for dryness. Negative for pain and visual disturbance.  Respiratory: Negative for cough, hemoptysis, shortness of breath and difficulty breathing.   Cardiovascular: Positive for swelling in legs/feet. Negative for chest  pain, palpitations and hypertension.  Gastrointestinal: Negative for blood in stool, constipation and diarrhea.  Endocrine: Negative for increased urination.  Genitourinary: Negative for painful urination.  Musculoskeletal: Positive for arthralgias, joint pain and morning stiffness. Negative for joint swelling, myalgias, muscle weakness, muscle tenderness and myalgias.  Skin: Negative for color change, pallor, rash, hair loss, nodules/bumps, skin tightness, ulcers and sensitivity to sunlight.  Allergic/Immunologic: Negative for susceptible to infections.  Neurological: Negative for dizziness, numbness, headaches and weakness.  Hematological: Negative for swollen glands.  Psychiatric/Behavioral: Positive for depressed mood and sleep disturbance. The patient is not nervous/anxious.     PMFS History:  Patient Active Problem List   Diagnosis Date Noted  . Routine general medical examination at a health care facility 12/11/2016  . Primary osteoarthritis of left hip 05/01/2016  . Autoimmune disease ([place]) 11/29/2015  . High risk medication use 11/29/2015  . Vitamin D deficiency 11/29/2015  . Macular degeneration 11/29/2015  . Bilateral lower extremity edema 10/26/2015  . H/O total knee replacement, bilateral 12/09/2013  . Breast mass, left 01/18/2011  . Obesity 12/08/2009  . Venous (peripheral) insufficiency 07/28/2009  . PAIN IN JOINT, ANKLE AND FOOT 01/12/2008  . Obstructive sleep apnea 08/29/2007  . GAIT DISTURBANCE 08/14/2007  . Depression 08/26/2006  . Essential hypertension 08/26/2006  . Primary osteoarthritis of both hands 08/26/2006  . Urinary incontinence 08/26/2006    Past Medical History:  Diagnosis  Date  . Anxiety   . Breast mass, left   . Depression   . Gait disturbance   . Hypertension   . Obesity   . Osteoarthritis   . Rheumatoid arthritis(714.0)   . Sleep apnea    cpap  . Urinary incontinence   . Venous insufficiency     Family History  Problem Relation Age  of Onset  . Diabetes Other   . Stroke Other   . Asthma Brother   . Heart disease Brother   . Coronary artery disease Father   . Skin cancer Father   . Heart disease Father   . Coronary artery disease Brother    Past Surgical History:  Procedure Laterality Date  . ABDOMINAL HYSTERECTOMY    . BLADDER SURGERY     sling  . BLADDER SUSPENSION  2009  . BREAST REDUCTION SURGERY    . COLONOSCOPY    . ESOPHAGOGASTRODUODENOSCOPY    . EYE SURGERY    . REPLACEMENT TOTAL KNEE Left   . TOE SURGERY Left    Left great toe  . TOTAL HIP ARTHROPLASTY    . TOTAL KNEE ARTHROPLASTY Right 12/09/2013   Procedure: Right Total Knee Arthroplasty;  Surgeon: Newt Minion, MD;  Location: [facility];  Service: Orthopedics;  Laterality: Right;  Marland Kitchen VESICOVAGINAL FISTULA CLOSURE W/ TAH     Social History   Social History Narrative  . Not on file   Immunization History  Administered Date(s) Administered  . Influenza Split 12/14/2010, 12/06/2012  . Influenza Whole 02/06/2004, 12/08/2009  . Influenza, High Dose Seasonal PF 10/26/2015, 12/11/2016  . Influenza,inj,Quad PF,6+ Mos 10/15/2013, 11/19/2014  . Pneumococcal Conjugate-13 03/18/2013  . Pneumococcal Polysaccharide-23 06/30/2008, 10/26/2015  . Td 02/05/2005  . Tdap 12/14/2010     Objective: Vital Signs: BP (!) 141/89 (BP Location: Left Wrist, Patient Position: Sitting, Cuff Size: Normal)   Pulse 80   Resp 15   Ht 5\' 10"  (1.778 m)   Wt 275 lb (124.7 kg)   BMI 39.46 kg/m    Physical Exam Vitals signs and nursing note reviewed.  Constitutional:      Appearance: She is well-developed.  HENT:     Head: Normocephalic and atraumatic.  Eyes:     Conjunctiva/sclera: Conjunctivae normal.  Neck:     Musculoskeletal: Normal range of motion.  Cardiovascular:     Rate and Rhythm: Normal rate and regular rhythm.     Heart sounds: Normal heart sounds.  Pulmonary:     Effort: Pulmonary effort is normal.     Breath sounds: Normal breath sounds.    Abdominal:     General: Bowel sounds are normal.     Palpations: Abdomen is soft.  Lymphadenopathy:     Cervical: No cervical adenopathy.  Skin:    General: Skin is warm and dry.     Capillary Refill: Capillary refill takes less than 2 seconds.  Neurological:     Mental Status: She is alert and oriented to person, place, and time.  Psychiatric:        Behavior: Behavior normal.      Musculoskeletal Exam: C-spine, thoracic spine, and lumbar spine good ROM.  No midline spinal tenderness.  No SI joint tenderness. Shoulder joints, elbow joints, wrist joints, MCPs, PIPs, and DIPs good ROM with no synovitis.  PIP and DIP synovial thickening noted.  Hip joints good ROM.  Bilateral knee replacements have good ROM with no discomfort.  Right knee warmth.  Pedal edema bilaterally.  CDAI Exam: CDAI Score: Not documented Patient Global Assessment: Not documented; Provider Global Assessment: Not documented Swollen: Not documented; Tender: Not documented Joint Exam   Not documented   There is currently no information documented on the homunculus. Go to the Rheumatology activity and complete the homunculus joint exam.  Investigation: No additional findings.  Imaging: Xr Knee 1-2 Views Left  Result Date: 03/12/2018 Radiographs of the left knee which is status post total knee arthroplasty with resurfacing of patella show with no sign of loosening. No acute finding.   Xr Knee 1-2 Views Right  Result Date: 03/12/2018 Radiographs of the right knee show stable alignment. No sign of loosening. No acute finding.   Recent Labs: Lab Results  Component Value Date   WBC 4.0 02/11/2018   HGB 12.3 02/11/2018   PLT 242.0 02/11/2018   NA 140 02/11/2018   K 3.8 02/11/2018   CL 105 02/11/2018   CO2 28 02/11/2018   GLUCOSE 75 02/11/2018   BUN 14 02/11/2018   CREATININE 0.75 02/11/2018   BILITOT 0.8 02/11/2018   ALKPHOS 107 02/11/2018   AST 13 02/11/2018   ALT 10 02/11/2018   PROT 6.5  02/11/2018   ALBUMIN 4.0 02/11/2018   CALCIUM 9.5 02/11/2018   GFRAA 87 11/01/2017    Speciality Comments: PLQ eye exam: 03/31/18 WNL @ [facility]  Procedures:  Large Joint Inj: R glenohumeral on 04/03/2018 9:48 AM Indications: pain Details: 27 G 1.5 in needle, posterior approach  Arthrogram: No  Medications: 40 mg triamcinolone acetonide 40 MG/ML; 1 mL lidocaine 1 % Aspirate: 0 mL Outcome: tolerated well, no immediate complications Procedure, treatment alternatives, risks and benefits explained, specific risks discussed. Consent was given by the patient. Immediately prior to procedure a time out was called to verify the correct patient, procedure, equipment, support staff and site/side marked as required. Patient was prepped and draped in the usual sterile fashion.     Allergies: Penicillins   Assessment / Plan:     Visit Diagnoses: Autoimmune disease ([place]) - +ANA, +RNP: She has not had any signs or symptoms of an autoimmune flare.  She is clinically doing well on Plaquenil 200 mg 1 tablet BID.  She has no synovitis on exam.  She has no oral or nasal ulcerations.  She has chronic sicca symptoms.  She uses OTC products for mouth dryness.  No parotid swelling or tenderness noted.  No malar rash noted.  She has not had any recent rashes.  No cervical lymphadenopathy.  She has chronic fatigue but no recent fevers.  Autoimmune labs on 11/01/2017 revealed sed rate WNL, complements WNL, double-stranded DNA negative, and UA normal. She will continue on PLQ 200 mg BID.  A refill was provided to the patient.  She was advised to notify us if she develops any new or worsening symptoms.  She will follow up in 5 months.   High risk medication use -Plaquenil 200 mg twice daily.  Last Plaquenil eye exam normal on 09/19/2017 and will monitor yearly.  Most recent CBC/CMP within normal limits on 02/11/2018.  Will monitor every 5 months.  Autoimmune labs on 11/01/2017 revealed sed rate WNL, complements  WNL, double-stranded DNA negative, and UA normal.   Primary osteoarthritis of both hands: She has PIP and DIP synovial thickening.  She has subluxation of several DIP joints.  She has no tenderness or synovitis.  Joint protection and muscle strengthening were discussed.   H/O total knee replacement, bilateral: Doing well.  Right knee warmth.  She has good ROM with no discomfort.    Primary osteoarthritis of left hip: She has good ROM with no discomfort.  Other fatigue: Chronic and related to insomnia.  She has interrupted sleep at night due to using a CPAP.  Pedal edema: She was diagnosed with lymphedema.  She has been wearing compression stockings which has been helping.  We discussed the use of lymphedema boots.  She is going to try seeing a chiropractor.   Chronic right shoulder pain - She presents today with right shoulder joint pain.  She has good ROM on exam.  X-rays of the right shoulder were obtained.  She requested a right shoulder cortisone injection.  She tolerated the procedure well. She was advised to monitor BP closely following the injection.  She was given a handout of shoulder exercises. Plan: XR Shoulder Right  Paresthesia of both hands: She has been experiencing intermittent paresthesias in both hands. Positive Phalen's sign bilaterally.  She has carpal tunnel night splints at home which she was encouraged to start using.  She declined a NCV study at this time.  She was advised to notify us if she develops worsening symptoms.     Other medical conditions are listed as follows:   Vitamin D deficiency  History of macular degeneration  History of depression  History of sleep apnea    Orders: Orders Placed This Encounter  Procedures  . Large Joint Inj  . XR Shoulder Right   Meds ordered this encounter  Medications  . hydroxychloroquine (PLAQUENIL) 200 MG tablet    Sig: Take 1 tablet (200 mg total) by mouth 2 (two) times daily.    Dispense:  180 tablet    Refill:   0    Face-to-face time spent with patient was 30 minutes. Greater than 50% of time was spent in counseling and coordination of care.  Follow-Up Instructions: Return in about 5 months (around 09/01/2018) for Autoimmune Disease, Osteoarthritis.   Ofilia Neas, PA-C       Note - This record has been created using Dragon software.  Chart creation errors have been sought, but may not always  have been located. Such creation errors do not reflect on  the standard of medical care.

## 2018-03-26 ENCOUNTER — Encounter: Payer: Medicare HMO | Admitting: Physical Therapy

## 2018-03-31 ENCOUNTER — Encounter: Payer: Self-pay | Admitting: Adult Health

## 2018-03-31 DIAGNOSIS — H401132 Primary open-angle glaucoma, bilateral, moderate stage: Secondary | ICD-10-CM | POA: Diagnosis not present

## 2018-04-01 ENCOUNTER — Ambulatory Visit: Payer: Medicare HMO | Admitting: Physical Therapy

## 2018-04-01 ENCOUNTER — Encounter: Payer: Self-pay | Admitting: Physical Therapy

## 2018-04-01 DIAGNOSIS — M6281 Muscle weakness (generalized): Secondary | ICD-10-CM | POA: Diagnosis not present

## 2018-04-01 DIAGNOSIS — R2681 Unsteadiness on feet: Secondary | ICD-10-CM

## 2018-04-01 DIAGNOSIS — M25562 Pain in left knee: Principal | ICD-10-CM

## 2018-04-01 DIAGNOSIS — R262 Difficulty in walking, not elsewhere classified: Secondary | ICD-10-CM | POA: Diagnosis not present

## 2018-04-01 DIAGNOSIS — G8929 Other chronic pain: Secondary | ICD-10-CM

## 2018-04-01 DIAGNOSIS — M25561 Pain in right knee: Secondary | ICD-10-CM | POA: Diagnosis not present

## 2018-04-01 DIAGNOSIS — R252 Cramp and spasm: Secondary | ICD-10-CM | POA: Diagnosis not present

## 2018-04-01 NOTE — Patient Instructions (Signed)
PLANTARFLEXION STRENGTHENING:   Balancing ActANKLE: Plantarflexion, Bilateral - Standing   Stand with upright posture. Raise heels up as high as possible. _25__ reps per set, _2__ sets per day, _7__ days per week Hold onto a support.  r   Copyright  VHI. All rights reserved.  Step-Up: Lateral   Step up to side with right leg. Bring other foot up onto _6___ inch step. Return to floor position with left leg. Repeat _10_x 2__ times per session. Do __1-2__ sessions per day. Do 10 for right and left leg and move to 20 x as you progress   Copyright  VHI. All rights reserved.  Forward   Facing step, place one leg on step, flexed at hip. Step up slowly, bringing hips in line with knee and shoulder. Bring other foot onto step. Reverse process to step back down. Repeat with other leg. Do _10___ repetitions, ___2_ sets. Start with one set for right and left legs  http://bt.exer.us/154   Copyright  VHI. All rights reserved.  Voncille Lo, PT Certified Exercise Expert for the Aging Adult  04/01/18 10:31 AM Phone: [phone] Fax: [phone]

## 2018-04-01 NOTE — Therapy (Signed)
[place], [place], [address] Phone: [phone]   Fax:  [phone]  Physical Therapy Treatment  Patient Details  Name: Taylor Mccall MRN: [id number] Date of Birth: [birth year] Referring Provider (PT): Suzan Slick    Encounter Date: 04/01/2018  PT End of Session - 04/01/18 1007    Visit Number  3    Number of Visits  16    Date for PT Re-Evaluation  05/13/18    Authorization Type  [employer]  KX modifier 15th visit and progress vote every 10ths    PT Start Time  1014    PT Stop Time  1055    PT Time Calculation (min)  41 min    Activity Tolerance  Patient tolerated treatment well    Behavior During Therapy  [facility] for tasks assessed/performed       Past Medical History:  Diagnosis Date  . Anxiety   . Breast mass, left   . Depression   . Gait disturbance   . Hypertension   . Obesity   . Osteoarthritis   . Rheumatoid arthritis(714.0)   . Sleep apnea    cpap  . Urinary incontinence   . Venous insufficiency     Past Surgical History:  Procedure Laterality Date  . ABDOMINAL HYSTERECTOMY    . BLADDER SURGERY     sling  . BLADDER SUSPENSION  2009  . BREAST REDUCTION SURGERY    . COLONOSCOPY    . ESOPHAGOGASTRODUODENOSCOPY    . EYE SURGERY    . REPLACEMENT TOTAL KNEE Left   . TOE SURGERY Left    Left great toe  . TOTAL HIP ARTHROPLASTY    . TOTAL KNEE ARTHROPLASTY Right 12/09/2013   Procedure: Right Total Knee Arthroplasty;  Surgeon: Newt Minion, MD;  Location: [facility];  Service: Orthopedics;  Laterality: Right;  Marland Kitchen VESICOVAGINAL FISTULA CLOSURE W/ TAH      There were no vitals filed for this visit.  Subjective Assessment - 04/01/18 1211    Subjective  I am feeling so much better.  My knees are feeling a little sore but I can now get up out of my chair with less effort.  I am walking with a cane now    Pertinent History  lymphedema in legs    Limitations   Standing;Walking;Sitting    How long can you sit comfortably?  I can sit for 20-25 minutes    How long can you stand comfortably?  I can stand for 15-20 minutes now    How long can you walk comfortably?  I can walk for 10-15 minutes    Diagnostic tests  See Chart    Patient Stated Goals  I want to be able to walk without rollator    Currently in Pain?  Yes    Pain Score  4     Pain Location  Knee    Pain Orientation  Right    Pain Descriptors / Indicators  Aching    Pain Type  Chronic pain    Pain Onset  More than a month ago    Pain Frequency  Constant    Multiple Pain Sites  Yes    Pain Score  4    Pain Location  Knee    Pain Orientation  Left    Pain Descriptors / Indicators  Aching    Pain Type  Chronic pain  [place] Adult PT Treatment/Exercise - 04/01/18 1016      Self-Care   Self-Care  Other Self-Care Comments    Other Self-Care Comments   discussed need to work incrementally on floor to standing transfers.  and discussed plan to do so , trying to alleviate fear. of falling through graded movement      Knee/Hip Exercises: Stretches   Active Hamstring Stretch  2 reps;Left;Right;30 seconds    Hip Flexor Stretch Limitations  2 reps right and left with knee flexion added 30 sec each    Other Knee/Hip Stretches  figure 4 stretch 2 x right for 15 sec and 2x on left for 15 sec      Knee/Hip Exercises: Aerobic   Nustep  5 minutes level 4        Knee/Hip Exercises: Machines for Strengthening   Hip Cybex  15 x 1 right and left 37.5 lb      Knee/Hip Exercises: Standing   Lateral Step Up  10 reps;2 sets;Left;Right    Forward Step Up  2 sets;10 reps;Left;Right    Other Standing Knee Exercises  heel raises 20 x and then 10 x bil.      Other Standing Knee Exercises  standing at counter 10 x right and left mini lunge       Knee/Hip Exercises: Supine   Quad Sets  20 reps;Left;Right    Heel Slides  10 reps;Right;Left    Bridges with Diona Foley Squeeze   1 set;15 reps;Both    Straight Leg Raises  Right;10 reps;1 set    Straight Leg Raises Limitations  8x on left both with 5 lb weights    Other Supine Knee/Hip Exercises  supine clams with red t band 10 x 2              PT Education - 04/01/18 1033    Education Details  added step ups forward and lateral and heel raises    Person(s) Educated  Patient    Methods  Explanation;Demonstration;Tactile cues;Verbal cues;Handout    Comprehension  Verbalized understanding;Returned demonstration       PT Short Term Goals - 04/01/18 1215      PT SHORT TERM GOAL #1   Title  "Independent with initial HEP    Baseline  indepenedent with stretches and intial strengthening    Time  4    Period  Weeks    Status  Achieved    Target Date  04/15/18      PT SHORT TERM GOAL #2   Title  "Pt will ambulate with LRAD and without limp    Baseline  Pt using a cane and no rollator today    Time  4    Status  On-going    Target Date  04/15/18      PT SHORT TERM GOAL #3   Title  Pt will be able to perform sink squat with chair support in back with pain 4/10 or less    Baseline  Pt able to perform sink squat with 3-4/10 pain and 90 degrees knee flexion    Time  4    Period  Weeks    Status  Achieved    Target Date  04/15/18        PT Long Term Goals - 03/18/18 1140      PT LONG TERM GOAL #1   Title  "Pt will be independent with advanced HEP.     Time  8    Period  Weeks  Status  New    Target Date  05/13/18      PT LONG TERM GOAL #2   Title  Pt will be able to demonstrate 5 X STS in 15 sec to show increased LE strength and decreased fall risk    Baseline  eval 30.11 5 x STS and must use hands to rise from chair    Time  8    Period  Weeks    Status  New    Target Date  05/13/18      PT LONG TERM GOAL #3   Title  "Pt will improve her L/R knee flexion to  </= 120 degrees and extension to </= 5 degrees with </= 2/10 pain for a more functional and efficient gait pattern    Time  8     Period  Weeks    Status  New    Target Date  05/13/18      PT LONG TERM GOAL #4   Title  "FOTO will improve from 70% limitation   to  58% limitation    indicating improved functional mobility .     Time  8    Period  Weeks    Status  New    Target Date  05/13/18      PT LONG TERM GOAL #5   Title  "Pt will improve L/R knee/hip extensor/flexor strength to >/= 4+/5 with </= 2/10 pain to promote safety with walking/standing activities    Time  8    Period  Weeks    Status  New    Target Date  05/13/18      PT LONG TERM GOAL #6   Title  Pt will be able rise from regular toilet in order to perform toileting independently in public rest rooms for increased mobility in community    Time  8    Period  Weeks    Status  New    Target Date  05/13/18            Plan - 04/01/18 1217    Clinical Impression Statement  Pt with 4/10 pain and able to fully participate in PT today.  Pt achieved LTG # 1 and # 3 today.  Pt able to progress to step  ups with full body weight.  Pt expressed fear of falling and told of one incident she fell in the yard.  Pt educated on incremental plan of strengthening to achieve in graded movments in order for her to achieve getting up from ground with good technique at future visits.  Pt doing well toward progression of all STG's     Rehab Potential  Good    PT Frequency  2x / week    PT Duration  8 weeks    PT Treatment/Interventions  ADLs/Self Care Home Management;Cryotherapy;Electrical Stimulation;Iontophoresis 4mg /ml Dexamethasone;Moist Heat;Ultrasound;Therapeutic exercise;Therapeutic activities;Functional mobility training;Stair training;Gait training;Neuromuscular re-education;Patient/family education;Manual techniques;Passive range of motion;Dry needling;Taping;Joint Manipulations    PT Next Visit Plan  [place] HEP and step ups.  add lunge at counter and work on incremental steps for floor transfer    PT Home Exercise Plan  level 1 knee, sitting hamstring  stretch    Consulted and Agree with Plan of Care  Patient       Patient will benefit from skilled therapeutic intervention in order to improve the following deficits and impairments:  Abnormal gait, Decreased activity tolerance, Decreased endurance, Decreased mobility, Decreased range of motion, Difficulty walking, Decreased strength, Increased edema, Impaired flexibility,  Increased muscle spasms, Obesity, Postural dysfunction, Improper body mechanics, Pain  Visit Diagnosis: Chronic pain of left knee  Chronic pain of right knee  Muscle weakness (generalized)  Difficulty in walking, not elsewhere classified  Cramp and spasm  Unsteadiness on feet     Problem List Patient Active Problem List   Diagnosis Date Noted  . Routine general medical examination at a health care facility 12/11/2016  . Primary osteoarthritis of left hip 05/01/2016  . Autoimmune disease ([place]) 11/29/2015  . High risk medication use 11/29/2015  . Vitamin D deficiency 11/29/2015  . Macular degeneration 11/29/2015  . Bilateral lower extremity edema 10/26/2015  . H/O total knee replacement, bilateral 12/09/2013  . Breast mass, left 01/18/2011  . Obesity 12/08/2009  . Venous (peripheral) insufficiency 07/28/2009  . PAIN IN JOINT, ANKLE AND FOOT 01/12/2008  . Obstructive sleep apnea 08/29/2007  . GAIT DISTURBANCE 08/14/2007  . Depression 08/26/2006  . Essential hypertension 08/26/2006  . Primary osteoarthritis of both hands 08/26/2006  . Urinary incontinence 08/26/2006    Voncille Lo, PT Certified Exercise Expert for the Aging Adult  04/01/18 12:21 PM Phone: [phone] Fax: [facility] [address] [place], [place], [address] Phone: [phone]   Fax:  [phone]  Name: TAMEAKA EICHHORN MRN: [id number] Date of Birth: 02-21-1945

## 2018-04-03 ENCOUNTER — Ambulatory Visit (INDEPENDENT_AMBULATORY_CARE_PROVIDER_SITE_OTHER): Payer: Medicare HMO

## 2018-04-03 ENCOUNTER — Encounter: Payer: Self-pay | Admitting: Physician Assistant

## 2018-04-03 ENCOUNTER — Ambulatory Visit (INDEPENDENT_AMBULATORY_CARE_PROVIDER_SITE_OTHER): Payer: Medicare HMO | Admitting: Physician Assistant

## 2018-04-03 ENCOUNTER — Encounter: Payer: Self-pay | Admitting: Family Medicine

## 2018-04-03 VITALS — BP 141/89 | HR 80 | Resp 15 | Ht 70.0 in | Wt 275.0 lb

## 2018-04-03 DIAGNOSIS — M1612 Unilateral primary osteoarthritis, left hip: Secondary | ICD-10-CM | POA: Diagnosis not present

## 2018-04-03 DIAGNOSIS — R202 Paresthesia of skin: Secondary | ICD-10-CM

## 2018-04-03 DIAGNOSIS — M19042 Primary osteoarthritis, left hand: Secondary | ICD-10-CM

## 2018-04-03 DIAGNOSIS — M25511 Pain in right shoulder: Secondary | ICD-10-CM

## 2018-04-03 DIAGNOSIS — Z8669 Personal history of other diseases of the nervous system and sense organs: Secondary | ICD-10-CM

## 2018-04-03 DIAGNOSIS — M359 Systemic involvement of connective tissue, unspecified: Secondary | ICD-10-CM

## 2018-04-03 DIAGNOSIS — Z79899 Other long term (current) drug therapy: Secondary | ICD-10-CM

## 2018-04-03 DIAGNOSIS — G8929 Other chronic pain: Secondary | ICD-10-CM | POA: Diagnosis not present

## 2018-04-03 DIAGNOSIS — R5383 Other fatigue: Secondary | ICD-10-CM

## 2018-04-03 DIAGNOSIS — R600 Localized edema: Secondary | ICD-10-CM

## 2018-04-03 DIAGNOSIS — R69 Illness, unspecified: Secondary | ICD-10-CM | POA: Diagnosis not present

## 2018-04-03 DIAGNOSIS — E559 Vitamin D deficiency, unspecified: Secondary | ICD-10-CM | POA: Diagnosis not present

## 2018-04-03 DIAGNOSIS — M19041 Primary osteoarthritis, right hand: Secondary | ICD-10-CM | POA: Diagnosis not present

## 2018-04-03 DIAGNOSIS — Z96653 Presence of artificial knee joint, bilateral: Secondary | ICD-10-CM | POA: Diagnosis not present

## 2018-04-03 DIAGNOSIS — Z8659 Personal history of other mental and behavioral disorders: Secondary | ICD-10-CM

## 2018-04-03 MED ORDER — LIDOCAINE HCL 1 % IJ SOLN
1.0000 mL | INTRAMUSCULAR | Status: AC | PRN
  Administered 2018-04-03: 1 mL

## 2018-04-03 MED ORDER — HYDROXYCHLOROQUINE SULFATE 200 MG PO TABS: 200.0000 mg | ORAL_TABLET | Freq: Two times a day (BID) | ORAL | 0 refills | Status: DC

## 2018-04-03 MED ORDER — TRIAMCINOLONE ACETONIDE 40 MG/ML IJ SUSP
40.0000 mg | INTRAMUSCULAR | Status: AC | PRN
  Administered 2018-04-03: 40 mg via INTRA_ARTICULAR

## 2018-04-07 ENCOUNTER — Encounter: Payer: Medicare HMO | Admitting: Physical Therapy

## 2018-04-08 ENCOUNTER — Encounter: Payer: Self-pay | Admitting: Physical Therapy

## 2018-04-08 ENCOUNTER — Ambulatory Visit: Payer: Medicare HMO | Attending: Family | Admitting: Physical Therapy

## 2018-04-08 DIAGNOSIS — R2681 Unsteadiness on feet: Secondary | ICD-10-CM | POA: Insufficient documentation

## 2018-04-08 DIAGNOSIS — G8929 Other chronic pain: Secondary | ICD-10-CM

## 2018-04-08 DIAGNOSIS — M25561 Pain in right knee: Secondary | ICD-10-CM | POA: Diagnosis not present

## 2018-04-08 DIAGNOSIS — R262 Difficulty in walking, not elsewhere classified: Secondary | ICD-10-CM | POA: Diagnosis not present

## 2018-04-08 DIAGNOSIS — M25562 Pain in left knee: Secondary | ICD-10-CM | POA: Diagnosis not present

## 2018-04-08 DIAGNOSIS — R252 Cramp and spasm: Secondary | ICD-10-CM | POA: Diagnosis not present

## 2018-04-08 DIAGNOSIS — M6281 Muscle weakness (generalized): Secondary | ICD-10-CM | POA: Diagnosis not present

## 2018-04-08 NOTE — Therapy (Signed)
[facility] [address] [place], [place], [address] Phone: [phone]   Fax:  [phone]  Physical Therapy Treatment  Patient Details  Name: Taylor Mccall MRN: [id number] Date of Birth: 04/24/1945 Referring Provider (PT): Zamora, Erin R    Encounter Date: 04/08/2018  PT End of Session - 04/08/18 1223    Visit Number  4    Number of Visits  16    Date for PT Re-Evaluation  05/13/18    PT Start Time  1017    PT Stop Time  1100    PT Time Calculation (min)  43 min    Activity Tolerance  Patient tolerated treatment well    Behavior During Therapy  WFL for tasks assessed/performed       Past Medical History:  Diagnosis Date  . Anxiety   . Breast mass, left   . Depression   . Gait disturbance   . Hypertension   . Obesity   . Osteoarthritis   . Rheumatoid arthritis(714.0)   . Sleep apnea    cpap  . Urinary incontinence   . Venous insufficiency     Past Surgical History:  Procedure Laterality Date  . ABDOMINAL HYSTERECTOMY    . BLADDER SURGERY     sling  . BLADDER SUSPENSION  2009  . BREAST REDUCTION SURGERY    . COLONOSCOPY    . ESOPHAGOGASTRODUODENOSCOPY    . EYE SURGERY    . REPLACEMENT TOTAL KNEE Left   . TOE SURGERY Left    Left great toe  . TOTAL HIP ARTHROPLASTY    . TOTAL KNEE ARTHROPLASTY Right 12/09/2013   Procedure: Right Total Knee Arthroplasty;  Surgeon: Marcus Duda V, MD;  Location: [facility];  Service: Orthopedics;  Laterality: Right;  . VESICOVAGINAL FISTULA CLOSURE W/ TAH      There were no vitals filed for this visit.  Subjective Assessment - 04/08/18 1028    Subjective  I was so sore I could barely move.  My shoulder was hurting I got an injection in my shoulder    Pertinent History  lymphedema in legs    Limitations  Standing;Walking;Sitting    How long can you sit comfortably?  I can now sit for an hour    How long can you stand comfortably?  I can stand about 30 minutes now    How  long can you walk comfortably?  I can walk only 10-15 minutes because of my breath    Diagnostic tests  See Chart    Patient Stated Goals  I want to be able to walk without rollator    Currently in Pain?  Yes    Pain Score  0-No pain    Pain Location  Knee    Pain Orientation  Right    Pain Descriptors / Indicators  Aching    Pain Type  Chronic pain    Pain Onset  More than a month ago    Multiple Pain Sites  Yes    Pain Score  0    Pain Location  Knee    Pain Orientation  Left    Pain Descriptors / Indicators  Aching         [facility] PT Assessment - 04/08/18 0001      Sit to Stand   Comments  14.12 sec      AROM   Right Knee Extension  4    Right Knee Flexion  125    Left Knee Extension    0    Left Knee Flexion  115      PROM   Right Knee Extension  2    Right Knee Flexion  130    Left Knee Extension  0    Left Knee Flexion  117                   [facility] Adult PT Treatment/Exercise - 04/08/18 1017      Knee/Hip Exercises: Stretches   Active Hamstring Stretch  2 reps;Left;Right;30 seconds    Hip Flexor Stretch Limitations  2 reps right and left with knee flexion added 30 sec each    Other Knee/Hip Stretches  figure 4 stretch 2 x right for 15 sec and 2x on left for 15 sec      Knee/Hip Exercises: Aerobic   Nustep  5 minutes level 6       Knee/Hip Exercises: Standing   Lateral Step Up  10 reps;2 sets;Left;Right    Forward Step Up  Left;Right;10 reps;2 sets    Other Standing Knee Exercises  8 x 3 sets of goblet squat iwth 6 lb dumbbell. , deadlift 8 x with 15 lb on 4 inch step.  5 x 15 lb laundry basket.  worked on hip hinge end range with green t band x 10 resistance     Other Standing Knee Exercises  sink squat 10 x 2, hip hinge at wall 10 x with external cue.  use of cane for dowel hip hinge with VC/TC, 10 x hip hinge to touch KB on 4 inch step.    requiring extra rest between strengthening.     Knee/Hip Exercises: Supine   Heel Slides  10 reps;Right;Left     Bridges with Ball Squeeze  1 set;15 reps;Both    Other Supine Knee/Hip Exercises  supine clams with red t band 10 x 2              PT Education - 04/08/18 1055    Education Details  added hip hinge and goblet squat    Person(s) Educated  Patient    Methods  Explanation;Demonstration;Tactile cues;Verbal cues;Handout    Comprehension  Verbalized understanding;Returned demonstration       PT Short Term Goals - 04/08/18 1056      PT SHORT TERM GOAL #1   Title  "Independent with initial HEP    Baseline  indepenedent with stretches and intial strengthening    Time  4    Period  Weeks    Status  Achieved      PT SHORT TERM GOAL #2   Title  "Pt will ambulate with LRAD and without limp    Baseline  Pt using a cane and no rollator today slight limp due to soreness    Time  4    Period  Weeks    Status  Achieved      PT SHORT TERM GOAL #3   Title  Pt will be able to perform sink squat with chair support in back with pain 4/10 or less    Baseline  Pt able to perform sink squat with 3-4/10 pain and 90 degrees knee flexion    Time  4    Period  Weeks    Status  Achieved        PT Long Term Goals - 04/08/18 1057      PT LONG TERM GOAL #1   Title  "Pt will be independent with advanced HEP.       Baseline  working on advanced HEP    Time  8    Period  Weeks    Status  On-going      PT LONG TERM GOAL #2   Title  Pt will be able to demonstrate 5 X STS in 15 sec to show increased LE strength and decreased fall risk    Baseline  14.12 sec today 5 x sts    Time  8    Period  Weeks    Status  Achieved      PT LONG TERM GOAL #3   Title  "Pt will improve her L/R knee flexion to  </= 120 degrees and extension to </= 5 degrees with </= 2/10 pain for a more functional and efficient gait pattern    Baseline  0/10 pain  AROM knee ext/flex R 4-125, L 0 - 115, PROM knee ext/flex 2-130R, 0-117 L    Time  8    Period  Weeks    Status  Partially Met      PT LONG TERM GOAL #4    Title  "FOTO will improve from 70% limitation   to  58% limitation    indicating improved functional mobility .     Time  8    Period  Weeks    Status  Unable to assess      PT LONG TERM GOAL #5   Title  "Pt will improve L/R knee/hip extensor/flexor strength to >/= 4+/5 with </= 2/10 pain to promote safety with walking/standing activities    Time  8    Period  Weeks    Status  On-going      PT LONG TERM GOAL #6   Title  Pt will be able rise from regular toilet in order to perform toileting independently in public rest rooms for increased mobility in community    Time  8    Period  Weeks    Status  On-going            Plan - 04/08/18 1219    Clinical Impression Statement  Pt remarks being sore but states she has 0/10 pain at end of session.  Pt achieved all STG'sPt able to meet LTG#2 today with 14.12 5 x STS and is making good progress toward completion of AROM  of knees.  Ms. Wrightson are doing so well with continued strengtheing and is on target for completion of goalsl in next 2 visits    Rehab Potential  Good    PT Frequency  2x / week    PT Duration  8 weeks    PT Treatment/Interventions  ADLs/Self Care Home Management;Cryotherapy;Electrical Stimulation;Iontophoresis 4m/ml Dexamethasone;Moist Heat;Ultrasound;Therapeutic exercise;Therapeutic activities;Functional mobility training;Stair training;Gait training;Neuromuscular re-education;Patient/family education;Manual techniques;Passive range of motion;Dry needling;Taping;Joint Manipulations    PT Next Visit Plan  floor transfer, FOTO  deadlift    PT Home Exercise Plan  See HEP    Consulted and Agree with Plan of Care  Patient       Patient will benefit from skilled therapeutic intervention in order to improve the following deficits and impairments:  Abnormal gait, Decreased activity tolerance, Decreased endurance, Decreased mobility, Decreased range of motion, Difficulty walking, Decreased strength, Increased edema, Impaired  flexibility, Increased muscle spasms, Obesity, Postural dysfunction, Improper body mechanics, Pain  Visit Diagnosis: Chronic pain of left knee  Chronic pain of right knee  Muscle weakness (generalized)  Difficulty in walking, not elsewhere classified  Cramp and spasm  Unsteadiness on feet  Problem List Patient Active Problem List   Diagnosis Date Noted  . Routine general medical examination at a health care facility 12/11/2016  . Primary osteoarthritis of left hip 05/01/2016  . Autoimmune disease ([place]) 11/29/2015  . High risk medication use 11/29/2015  . Vitamin D deficiency 11/29/2015  . Macular degeneration 11/29/2015  . Bilateral lower extremity edema 10/26/2015  . H/O total knee replacement, bilateral 12/09/2013  . Breast mass, left 01/18/2011  . Obesity 12/08/2009  . Venous (peripheral) insufficiency 07/28/2009  . PAIN IN JOINT, ANKLE AND FOOT 01/12/2008  . Obstructive sleep apnea 08/29/2007  . GAIT DISTURBANCE 08/14/2007  . Depression 08/26/2006  . Essential hypertension 08/26/2006  . Primary osteoarthritis of both hands 08/26/2006  . Urinary incontinence 08/26/2006    Voncille Lo, PT Certified Exercise Expert for the Aging Adult  04/08/18 12:25 PM Phone: [phone] Fax: [facility] [address] [place], [place], [address] Phone: [phone]   Fax:  [phone]  Name: JULITZA RICKLES MRN: [id number] Date of Birth: [birth year]

## 2018-04-08 NOTE — Patient Instructions (Signed)
     Voncille Lo, PT Certified Exercise Expert for the Aging Adult  04/08/18 10:55 AM Phone: [phone] Fax: [phone]

## 2018-04-09 ENCOUNTER — Encounter: Payer: Medicare HMO | Admitting: Physical Therapy

## 2018-04-09 ENCOUNTER — Telehealth: Payer: Self-pay | Admitting: Rheumatology

## 2018-04-09 ENCOUNTER — Ambulatory Visit (INDEPENDENT_AMBULATORY_CARE_PROVIDER_SITE_OTHER): Payer: Medicare HMO | Admitting: Family

## 2018-04-09 DIAGNOSIS — M359 Systemic involvement of connective tissue, unspecified: Secondary | ICD-10-CM

## 2018-04-09 MED ORDER — HYDROXYCHLOROQUINE SULFATE 200 MG PO TABS: 200.0000 mg | ORAL_TABLET | Freq: Two times a day (BID) | ORAL | 0 refills | Status: DC

## 2018-04-09 NOTE — Telephone Encounter (Signed)
Patient called stating the pharmacist at [facility] told her that the Plaquenil is backordered and wont be available until May.  Patient is requesting a return call.  Patient states she uses [facility] because it is less expensive.

## 2018-04-09 NOTE — Telephone Encounter (Signed)
Called pharmacy to confirm the Plaquenil is on back order.  [facility] confirmed that Plaquenil is on back order until May through their [employer] source [place] and [place].  They recommend trying a different pharmacy with a different distributor such as [facility] who uses [employer].    Was successful in getting a Plaquenil prescription filled at [facility] for patient last week so assume they have different distributors.  Returned patient's call and informed her that she would be unable to fill at [facility] patient to try and fill at another pharmacy such as [facility] who has a [place].  Patient is concerned about cost.  Researched medication prices with good Rx coupon and [facility] had a similar co-pay for 90-day supply that she was paying for [employer].  Patient asked for new prescription to be sent to [facility] as [facility] did not return her prescription.  All questions encouraged and answered.  Instructed patient to call with any further questions or concerns.  Mariella Saa, PharmD, [facility] Rheumatology Clinical Pharmacist  04/09/2018 3:59 PM

## 2018-04-09 NOTE — Telephone Encounter (Signed)
I re direct the referral to the [facility] to schedule for Lymphedema  For scheduling 336 [phone]

## 2018-04-09 NOTE — Telephone Encounter (Signed)
I  Re directed the referral to [facility]   To the

## 2018-04-10 ENCOUNTER — Ambulatory Visit: Payer: Medicare HMO | Admitting: Physical Therapy

## 2018-04-10 ENCOUNTER — Encounter: Payer: Self-pay | Admitting: Physical Therapy

## 2018-04-10 DIAGNOSIS — R262 Difficulty in walking, not elsewhere classified: Secondary | ICD-10-CM | POA: Diagnosis not present

## 2018-04-10 DIAGNOSIS — R2681 Unsteadiness on feet: Secondary | ICD-10-CM | POA: Diagnosis not present

## 2018-04-10 DIAGNOSIS — M25562 Pain in left knee: Principal | ICD-10-CM

## 2018-04-10 DIAGNOSIS — M6281 Muscle weakness (generalized): Secondary | ICD-10-CM | POA: Diagnosis not present

## 2018-04-10 DIAGNOSIS — R252 Cramp and spasm: Secondary | ICD-10-CM

## 2018-04-10 DIAGNOSIS — M25561 Pain in right knee: Secondary | ICD-10-CM | POA: Diagnosis not present

## 2018-04-10 DIAGNOSIS — G8929 Other chronic pain: Secondary | ICD-10-CM

## 2018-04-10 NOTE — Therapy (Signed)
[place], [place], [address] Phone: [phone]   Fax:  [phone]  Physical Therapy Treatment  Patient Details  Name: Taylor Mccall MRN: [id number] Date of Birth: [birth year] Referring Provider (PT): Suzan Slick    Encounter Date: 04/10/2018  PT End of Session - 04/10/18 1209    Visit Number  5    Number of Visits  16    Date for PT Re-Evaluation  05/13/18    Authorization Type  [employer] Medicare  KX modifier 15th visit and progress vote every 10ths    PT Start Time  1017    PT Stop Time  1100    PT Time Calculation (min)  43 min    Activity Tolerance  Patient tolerated treatment well    Behavior During Therapy  [facility] for tasks assessed/performed       Past Medical History:  Diagnosis Date  . Anxiety   . Breast mass, left   . Depression   . Gait disturbance   . Hypertension   . Obesity   . Osteoarthritis   . Rheumatoid arthritis(714.0)   . Sleep apnea    cpap  . Urinary incontinence   . Venous insufficiency     Past Surgical History:  Procedure Laterality Date  . ABDOMINAL HYSTERECTOMY    . BLADDER SURGERY     sling  . BLADDER SUSPENSION  2009  . BREAST REDUCTION SURGERY    . COLONOSCOPY    . ESOPHAGOGASTRODUODENOSCOPY    . EYE SURGERY    . REPLACEMENT TOTAL KNEE Left   . TOE SURGERY Left    Left great toe  . TOTAL HIP ARTHROPLASTY    . TOTAL KNEE ARTHROPLASTY Right 12/09/2013   Procedure: Right Total Knee Arthroplasty;  Surgeon: Newt Minion, MD;  Location: [place];  Service: Orthopedics;  Laterality: Right;  Marland Kitchen VESICOVAGINAL FISTULA CLOSURE W/ TAH      There were no vitals filed for this visit.                    [facility] Adult PT Treatment/Exercise - 04/10/18 1125      Transfers   Comments  educated and demo 3 different strategies for transfer from chair to floor and educated on fear of falling      Self-Care   Self-Care  Other Self-Care Comments    Other  Self-Care Comments   Pt educated on exercise progression and sequencing of transfers. form mat/floor to chair,       Neuro Re-ed    Neuro Re-ed Details   worked with clock your sef app for SLS activities and dual task with mini squat /clock and UE press ups. for 10 minutes       Knee/Hip Exercises: Standing   Other Standing Knee Exercises  push ups from counter 10 x 3   Pt with too much core movement VC/TC for proper form, mod cu   Other Standing Knee Exercises  EMOM - every minute on the minute 1 minutes each , 3 sets ( Total 18 minutes )  See THER ex note in          EMOM for 3 sets every minute on the minute Total 18 minutes Interval 1: 10 deadlifts with KB was using 30 lb in clinic from 8 inch block, may use laundry basket Interval 2: 15 step ups with weight 10 lb Interval 3: sink squat 10 x / heel raise 15 x Interval  4: standing and marching for cardio Interval 5: 10 step downs with weight 10 lb Interval 6:  Complete rest  deadlift from 8 in block      PT Education - 04/10/18 1030    Education Details  added to HEP and educated on  balance phone app- clock yourself and educated on floor transfer sequencing and graded exposure    Person(s) Educated  Patient    Methods  Explanation;Demonstration;Tactile cues;Verbal cues;Handout    Comprehension  Verbalized understanding;Returned demonstration       PT Short Term Goals - 04/08/18 1056      PT SHORT TERM GOAL #1   Title  "Independent with initial HEP    Baseline  indepenedent with stretches and intial strengthening    Time  4    Period  Weeks    Status  Achieved      PT SHORT TERM GOAL #2   Title  "Pt will ambulate with LRAD and without limp    Baseline  Pt using a cane and no rollator today slight limp due to soreness    Time  4    Period  Weeks    Status  Achieved      PT SHORT TERM GOAL #3   Title  Pt will be able to perform sink squat with chair support in back with pain 4/10 or less    Baseline  Pt able to  perform sink squat with 3-4/10 pain and 90 degrees knee flexion    Time  4    Period  Weeks    Status  Achieved        PT Long Term Goals - 04/08/18 1057      PT LONG TERM GOAL #1   Title  "Pt will be independent with advanced HEP.     Baseline  working on advanced HEP    Time  8    Period  Weeks    Status  On-going      PT LONG TERM GOAL #2   Title  Pt will be able to demonstrate 5 X STS in 15 sec to show increased LE strength and decreased fall risk    Baseline  14.12 sec today 5 x sts    Time  8    Period  Weeks    Status  Achieved      PT LONG TERM GOAL #3   Title  "Pt will improve her L/R knee flexion to  </= 120 degrees and extension to </= 5 degrees with </= 2/10 pain for a more functional and efficient gait pattern    Baseline  0/10 pain  AROM knee ext/flex R 4-125, L 0 - 115, PROM knee ext/flex 2-130R, 0-117 L    Time  8    Period  Weeks    Status  Partially Met      PT LONG TERM GOAL #4   Title  "FOTO will improve from 70% limitation   to  58% limitation    indicating improved functional mobility .     Time  8    Period  Weeks    Status  Unable to assess      PT LONG TERM GOAL #5   Title  "Pt will improve L/R knee/hip extensor/flexor strength to >/= 4+/5 with </= 2/10 pain to promote safety with walking/standing activities    Time  8    Period  Weeks    Status  On-going      PT  LONG TERM GOAL #6   Title  Pt will be able rise from regular toilet in order to perform toileting independently in public rest rooms for increased mobility in community    Time  8    Period  Weeks    Status  On-going            Plan - 04/10/18 1209    Clinical Impression Statement  Pt comes to clinic without limping and no cane.  Pt educated on balance phone app with SLS /dual task activities. Pt also educated on sequencing and graded exposure to prepare for floor transfer next week.  Pt able to participate in 3 rounds of 6 minute exercises sequence for strength and endurance  and explained progression . Pt is doing well and on track to complete all goals next visit    Rehab Potential  Good    PT Frequency  2x / week    PT Duration  8 weeks    PT Treatment/Interventions  ADLs/Self Care Home Management;Cryotherapy;Electrical Stimulation;Iontophoresis '4mg'$ /ml Dexamethasone;Moist Heat;Ultrasound;Therapeutic exercise;Therapeutic activities;Functional mobility training;Stair training;Gait training;Neuromuscular re-education;Patient/family education;Manual techniques;Passive range of motion;Dry needling;Taping;Joint Manipulations    PT Next Visit Plan  floor transfer, FOTO  EMOM progression    PT Home Exercise Plan  See HEP    Consulted and Agree with Plan of Care  Patient       Patient will benefit from skilled therapeutic intervention in order to improve the following deficits and impairments:  Abnormal gait, Decreased activity tolerance, Decreased endurance, Decreased mobility, Decreased range of motion, Difficulty walking, Decreased strength, Increased edema, Impaired flexibility, Increased muscle spasms, Obesity, Postural dysfunction, Improper body mechanics, Pain  Visit Diagnosis: Chronic pain of left knee  Chronic pain of right knee  Muscle weakness (generalized)  Difficulty in walking, not elsewhere classified  Cramp and spasm  Unsteadiness on feet     Problem List Patient Active Problem List   Diagnosis Date Noted  . Routine general medical examination at a health care facility 12/11/2016  . Primary osteoarthritis of left hip 05/01/2016  . Autoimmune disease ([place]) 11/29/2015  . High risk medication use 11/29/2015  . Vitamin D deficiency 11/29/2015  . Macular degeneration 11/29/2015  . Bilateral lower extremity edema 10/26/2015  . H/O total knee replacement, bilateral 12/09/2013  . Breast mass, left 01/18/2011  . Obesity 12/08/2009  . Venous (peripheral) insufficiency 07/28/2009  . PAIN IN JOINT, ANKLE AND FOOT 01/12/2008  . Obstructive sleep  apnea 08/29/2007  . GAIT DISTURBANCE 08/14/2007  . Depression 08/26/2006  . Essential hypertension 08/26/2006  . Primary osteoarthritis of both hands 08/26/2006  . Urinary incontinence 08/26/2006   Voncille Lo, PT Certified Exercise Expert for the Aging Adult  04/10/18 12:16 PM Phone: [phone] Fax: [facility] [address] [place], [place], [address] Phone: [phone]   Fax:  [phone]  Name: Taylor Mccall MRN: [id number] Date of Birth: 1945-10-10

## 2018-04-10 NOTE — Patient Instructions (Addendum)
  For Balance use the Clock Yourself App on your phone  It is about $3.00 only if you want to. You can still work on balance without it.  It is just fun   EMOM for 3 sets every minute on the minute Total 18 minutes Interval 1: 10 deadlifts with KB was using 30 lb in clinic, may use laundry basket Interval 2: 15 step ups with weight Interval 3: sink squat 10 x / heel raise 15 x Interval 4: standing and marching for cardio Interval 5: 10 step downs with weight Interval 6:  Complete rest   Voncille Lo, PT Certified Exercise Expert for the Aging Adult  04/10/18 10:57 AM Phone: [phone] Fax: [phone]

## 2018-04-15 ENCOUNTER — Encounter: Payer: Self-pay | Admitting: Physical Therapy

## 2018-04-15 ENCOUNTER — Ambulatory Visit: Payer: Medicare HMO | Admitting: Physical Therapy

## 2018-04-15 DIAGNOSIS — R2681 Unsteadiness on feet: Secondary | ICD-10-CM | POA: Diagnosis not present

## 2018-04-15 DIAGNOSIS — G8929 Other chronic pain: Secondary | ICD-10-CM | POA: Diagnosis not present

## 2018-04-15 DIAGNOSIS — R252 Cramp and spasm: Secondary | ICD-10-CM

## 2018-04-15 DIAGNOSIS — M6281 Muscle weakness (generalized): Secondary | ICD-10-CM

## 2018-04-15 DIAGNOSIS — M25562 Pain in left knee: Secondary | ICD-10-CM | POA: Diagnosis not present

## 2018-04-15 DIAGNOSIS — M25561 Pain in right knee: Secondary | ICD-10-CM | POA: Diagnosis not present

## 2018-04-15 DIAGNOSIS — R262 Difficulty in walking, not elsewhere classified: Secondary | ICD-10-CM | POA: Diagnosis not present

## 2018-04-15 NOTE — Patient Instructions (Signed)
WALKING  Walking is a great form of exercise to increase your strength, endurance and overall fitness.  A walking program can help you start slowly and gradually build endurance as you go.  Everyone's ability is different, so each person's starting point will be different.  You do not have to follow them exactly.  The are just samples. You should simply find out what's right for you and stick to that program.  You want to walk 45 minutes at a time.  You start slow In the beginning, you'll start off walking 2-3 times a day for short distances.  As you get stronger, you'll be walking further at just 1-2 times per day.  A. You Can Walk For A Certain Length Of Time Each Day    Walk 5 minutes 3 times per day.  Increase 2 minutes every 2 days (3 times per day).  Work up to 25-30 minutes (1-2 times per day).   Example:   Day 1-2 5 minutes 3 times per day   Day 7-8 12 minutes 2-3 times per day   Day 13-14 25 minutes 1-2 times per day  B. You Can Walk For a Certain Distance Each Day     Distance can be substituted for time.    Example:   3 trips to mailbox (at road)   3 trips to corner of block   3 trips around the block  C. Go to local [employer] and use the track.    Walk for distance ____ around track  Or time ____ minutes  D. Walk _x___ Jog ____ Run ___  Please only do the exercises that your therapist has initialed and dated    Piriformis (Supine)   Stretching: Piriformis (Supine)  Pull right knee toward opposite shoulder. Hold _30___ seconds. Relax. Repeat __3__ times per set. Do _1___ sets per session. Do _1___ sessions per day.     Voncille Lo, PT Certified Exercise Expert for the Aging Adult  04/15/18 11:08 AM Phone: [phone] Fax: [phone]

## 2018-04-15 NOTE — Therapy (Signed)
[place], [place], [address] Phone: [phone]   Fax:  [phone]  Physical Therapy Treatment/Discharge Note  Patient Details  Name: Taylor Mccall MRN: [id number] Date of Birth: 1946/01/10 Referring Provider (PT): Dondra Prader R NP   Encounter Date: 04/15/2018  PT End of Session - 04/15/18 1126    Visit Number  6    Number of Visits  16    Date for PT Re-Evaluation  05/13/18    Authorization Type  [employer]na Medicare  KX modifier 15th visit and progress vote every 10ths    PT Start Time  1017    PT Stop Time  1116    PT Time Calculation (min)  59 min    Activity Tolerance  Patient tolerated treatment well    Behavior During Therapy  [facility] for tasks assessed/performed       Past Medical History:  Diagnosis Date  . Anxiety   . Breast mass, left   . Depression   . Gait disturbance   . Hypertension   . Obesity   . Osteoarthritis   . Rheumatoid arthritis(714.0)   . Sleep apnea    cpap  . Urinary incontinence   . Venous insufficiency     Past Surgical History:  Procedure Laterality Date  . ABDOMINAL HYSTERECTOMY    . BLADDER SURGERY     sling  . BLADDER SUSPENSION  2009  . BREAST REDUCTION SURGERY    . COLONOSCOPY    . ESOPHAGOGASTRODUODENOSCOPY    . EYE SURGERY    . REPLACEMENT TOTAL KNEE Left   . TOE SURGERY Left    Left great toe  . TOTAL HIP ARTHROPLASTY    . TOTAL KNEE ARTHROPLASTY Right 12/09/2013   Procedure: Right Total Knee Arthroplasty;  Surgeon: Newt Minion, MD;  Location: [facility];  Service: Orthopedics;  Laterality: Right;  Marland Kitchen VESICOVAGINAL FISTULA CLOSURE W/ TAH      There were no vitals filed for this visit.  Subjective Assessment - 04/15/18 1025    Subjective  I rode my bike instead of foing up and down steps         [facility] PT Assessment - 04/15/18 1052      Assessment   Medical Diagnosis  Knee pain bil  Old TKR bil    Referring Provider (PT)  Dondra Prader R        Observation/Other Assessments   Focus on Therapeutic Outcomes (FOTO)   FOTO intake 69% limitation  31% ;predicted 58%      Sit to Stand   Comments  13.40   after RX and fatigued      AROM   Overall AROM   Deficits    Right Knee Extension  2    Right Knee Flexion  125    Left Knee Extension  0    Left Knee Flexion  121      PROM   Right Knee Extension  2    Right Knee Flexion  130    Left Knee Extension  0    Left Knee Flexion  122      Strength   Right Hip Flexion  4+/5    Right Hip Extension  4+/5    Right Hip ABduction  4+/5    Right Hip ADduction  5/5    Left Hip Flexion  4+/5    Left Hip Extension  4+/5    Left Hip ABduction  4+/5    Left Hip  ADduction  5/5    Right Knee Flexion  5/5    Right Knee Extension  5/5    Left Knee Flexion  5/5    Left Knee Extension  5/5                   [facility] Adult PT Treatment/Exercise - 04/15/18 1052      Transfers   Comments  Pt demo independent standing to floor transfers using mat on floor and chair to descend and ascend x 3 with VC and TC and use of gait belt if needed.  Pt was able to peform independently.  Pt also able to rise with 1 UE support from low public toilet with knee above hip.  x 2  Pt able to rise from regular tolilet independentlyl       Ambulation/Gait   Gait Pattern  Step-through pattern    Gait velocity  2.90 ft/sec     Gait Comments  no antalgic gait, no use of assistive device needs       Self-Care   Self-Care  Other Self-Care Comments    Other Self-Care Comments   walking program and sleep hygiene education      Neuro Re-ed    Neuro Re-ed Details   worked with clock your sef app for SLS activities and dual task with mini squat /clock and UE press ups. for 10 minutes  also given balance progressions for challenge.  perturbations while walking and carrying 15 lb to simulate baby holding for 150 ft.  Pt carrying also 30 lb box for 20 feet x 4       Knee/Hip Exercises: Stretches   Active  Hamstring Stretch  2 reps;Left;Right;30 seconds    Hip Flexor Stretch Limitations  2 reps right and left with knee flexion added 30 sec each    Piriformis Stretch  Both;2 reps;30 seconds    Piriformis Stretch Limitations  supine and with towel for added grip as needed    Other Knee/Hip Stretches  figure 4 stretch 2 x right for 15 sec and 2x on left for 15 sec      Knee/Hip Exercises: Standing   Other Standing Knee Exercises  pushups from counter and marching in place. sink squat 10 x 1,  counter hold and lunge with knee on ther ex pad, x 10 on right and left.  deadlift of 30 lb x 10     Other Standing Knee Exercises  EMOM - every minute on the minute 1 minutes each , 3 sets ( Total 18 minutes )  See THER ex note in       Knee/Hip Exercises: Supine   Other Supine Knee/Hip Exercises  gastroc stretch 3 x 30 sec hold against wall,  total body wall slide with UE 10 x  on right and left               PT Short Term Goals - 04/08/18 1056      PT SHORT TERM GOAL #1   Title  "Independent with initial HEP    Baseline  indepenedent with stretches and intial strengthening    Time  4    Period  Weeks    Status  Achieved      PT SHORT TERM GOAL #2   Title  "Pt will ambulate with LRAD and without limp    Baseline  Pt using a cane and no rollator today slight limp due to soreness    Time  4  Period  Weeks    Status  Achieved      PT SHORT TERM GOAL #3   Title  Pt will be able to perform sink squat with chair support in back with pain 4/10 or less    Baseline  Pt able to perform sink squat with 3-4/10 pain and 90 degrees knee flexion    Time  4    Period  Weeks    Status  Achieved        PT Long Term Goals - 04/15/18 1039      PT LONG TERM GOAL #1   Title  "Pt will be independent with advanced HEP.     Time  8    Period  Weeks    Status  Achieved      PT LONG TERM GOAL #2   Title  Pt will be able to demonstrate 5 X STS in 15 sec to show increased LE strength and decreased  fall risk    Baseline  13.40 sec 04-15-18 5 x sts    Time  8    Period  Weeks    Status  Achieved      PT LONG TERM GOAL #3   Title  "Pt will improve her L/R knee flexion to  </= 120 degrees and extension to </= 5 degrees with </= 2/10 pain for a more functional and efficient gait pattern    Baseline  0/10 pain  AROM knee ext/flex R 2- 125, L 0 - 121 PROM knee ext/flex 2-1300R, 0-122    Time  8    Status  Achieved      PT LONG TERM GOAL #4   Title  "FOTO will improve from 70% limitation   to  58% limitation    indicating improved functional mobility .     Baseline  31 % limitation    Time  8    Period  Weeks    Status  Achieved      PT LONG TERM GOAL #5   Title  "Pt will improve L/R knee/hip extensor/flexor strength to >/= 4+/5 with </= 2/10 pain to promote safety with walking/standing activities    Baseline  no pain most 4+/5 to 5/5 See flow chart    Status  Achieved      PT LONG TERM GOAL #6   Title  Pt will be able rise from regular toilet in order to perform toileting independently in public rest rooms for increased mobility in community    Baseline  Pt used UE support for low public toilet but able to do standard public toilet Independently    Time  8    Period  Weeks    Status  Achieved            Plan - 04/15/18 1141    Clinical Impression Statement  Pt comes to clinic with 0/10 pain in knees bilaterally  5 x STS at best 13.40sec within normal limits and low fall risk.  Pt achieved all LTG and demo ability to rise from the floor independently 3 x.  Pt iniitially had shared fear of falling and was able to address with pt ability to rise from floor.  Pt increased bil AROM of knees and able to perform challenging exercise program at home.  Pt willl be discharged from PT due to achievement of all goals and is pleased with current level.  FOTO improved for 58% limitation to 31%.     PT Frequency  2x /  week    PT Duration  8 weeks    PT Treatment/Interventions  ADLs/Self  Care Home Management;Cryotherapy;Electrical Stimulation;Iontophoresis '4mg'$ /ml Dexamethasone;Moist Heat;Ultrasound;Therapeutic exercise;Therapeutic activities;Functional mobility training;Stair training;Gait training;Neuromuscular re-education;Patient/family education;Manual techniques;Passive range of motion;Dry needling;Taping;Joint Manipulations    PT Next Visit Plan  DC    PT Home Exercise Plan  See HEP    Consulted and Agree with Plan of Care  Patient       Patient will benefit from skilled therapeutic intervention in order to improve the following deficits and impairments:  Abnormal gait, Decreased activity tolerance, Decreased endurance, Decreased mobility, Decreased range of motion, Difficulty walking, Decreased strength, Increased edema, Impaired flexibility, Increased muscle spasms, Obesity, Postural dysfunction, Improper body mechanics, Pain  Visit Diagnosis: Chronic pain of left knee  Chronic pain of right knee  Muscle weakness (generalized)  Difficulty in walking, not elsewhere classified  Cramp and spasm  Unsteadiness on feet     Problem List Patient Active Problem List   Diagnosis Date Noted  . Routine general medical examination at a health care facility 12/11/2016  . Primary osteoarthritis of left hip 05/01/2016  . Autoimmune disease ([place]) 11/29/2015  . High risk medication use 11/29/2015  . Vitamin D deficiency 11/29/2015  . Macular degeneration 11/29/2015  . Bilateral lower extremity edema 10/26/2015  . H/O total knee replacement, bilateral 12/09/2013  . Breast mass, left 01/18/2011  . Obesity 12/08/2009  . Venous (peripheral) insufficiency 07/28/2009  . PAIN IN JOINT, ANKLE AND FOOT 01/12/2008  . Obstructive sleep apnea 08/29/2007  . GAIT DISTURBANCE 08/14/2007  . Depression 08/26/2006  . Essential hypertension 08/26/2006  . Primary osteoarthritis of both hands 08/26/2006  . Urinary incontinence 08/26/2006    Dorothea Ogle 04/15/2018, 11:48  AM  [facility] [address] [place], [place], [address] Phone: [phone]   Fax:  [phone]  Name: Taylor Mccall MRN: [id number] Date of Birth: 05/26/1945  PHYSICAL THERAPY DISCHARGE SUMMARY  Visits from Start of Care: 6  Current functional level related to goals / functional outcomes: As above   Remaining deficits: [facility]  But can continue to improve at community center   Education / Equipment: HEP, sleep hygiene and walking program Plan: Patient agrees to discharge.  Patient goals were met. Patient is being discharged due to meeting the stated rehab goals.  ?????   And being pleased with current functional level  .Voncille Lo, PT Certified Exercise Expert for the Aging Adult  04/15/18 11:48 AM Phone: [phone] Fax: [phone]

## 2018-04-17 ENCOUNTER — Encounter: Payer: Medicare HMO | Admitting: Physical Therapy

## 2018-05-29 ENCOUNTER — Ambulatory Visit: Payer: Medicare HMO | Admitting: Occupational Therapy

## 2018-06-13 DIAGNOSIS — G4733 Obstructive sleep apnea (adult) (pediatric): Secondary | ICD-10-CM | POA: Diagnosis not present

## 2018-06-25 ENCOUNTER — Encounter: Payer: Self-pay | Admitting: Internal Medicine

## 2018-06-26 ENCOUNTER — Ambulatory Visit: Payer: Medicare HMO | Attending: Adult Health | Admitting: Occupational Therapy

## 2018-06-26 ENCOUNTER — Other Ambulatory Visit: Payer: Self-pay

## 2018-06-26 ENCOUNTER — Encounter: Payer: Self-pay | Admitting: Occupational Therapy

## 2018-06-26 DIAGNOSIS — I890 Lymphedema, not elsewhere classified: Secondary | ICD-10-CM

## 2018-06-26 NOTE — Therapy (Deleted)
[place] MAIN [facility] SERVICES [address] [place], [place], [address] Phone: [phone]   Fax:  [phone]  Occupational Therapy Evaluation  Patient Details  Name: Taylor Mccall MRN: [id number] Date of Birth: [birth year] No data recorded  Encounter Date: 06/26/2018  OT End of Session - 06/26/18 1809    Visit Number  1    Number of Visits  36    Date for OT Re-Evaluation  09/24/18    OT Start Time  0115    OT Stop Time  0230    OT Time Calculation (min)  75 min    Activity Tolerance  Patient tolerated treatment well;No increased pain    Behavior During Therapy  WFL for tasks assessed/performed       Past Medical History:  Diagnosis Date  . Anxiety   . Breast mass, left   . Depression   . Gait disturbance   . Hypertension   . Obesity   . Osteoarthritis   . Rheumatoid arthritis(714.0)   . Sleep apnea    cpap  . Urinary incontinence   . Venous insufficiency     Past Surgical History:  Procedure Laterality Date  . ABDOMINAL HYSTERECTOMY    . BLADDER SURGERY     sling  . BLADDER SUSPENSION  2009  . BREAST REDUCTION SURGERY    . COLONOSCOPY    . ESOPHAGOGASTRODUODENOSCOPY    . EYE SURGERY    . REPLACEMENT TOTAL KNEE Left   . TOE SURGERY Left    Left great toe  . TOTAL HIP ARTHROPLASTY    . TOTAL KNEE ARTHROPLASTY Right 12/09/2013   Procedure: Right Total Knee Arthroplasty;  Surgeon: Newt Minion, MD;  Location: [place];  Service: Orthopedics;  Laterality: Right;  Marland Kitchen VESICOVAGINAL FISTULA CLOSURE W/ TAH      There were no vitals filed for this visit.                      OT Education - 06/26/18 1314    Education Details  Provided Pt and family education regarding lymphatic structure and function, etiologies, onset patterns and stages of progression. Discussed  impact of obesity on lymphatic system function. Outlined Complete Decongestive Therapy (CDT)  as standard of care and provided in depth  information regarding 4 primary components of both Intensive and Self Management Phases, including Manual Lymph Drainage (MLD), compression wrapping and garments, skin care, and therapeutic exercise.   Pilar Plate discussion of high burden of care and in this case, need for consistent , daily caregiver assistance for optimal clinical outcome. Discussed  Importance of daily, ongoing LE self-care essential to retaining clinical gains and limiting progression.  Lastly, reviewed lymphedema precautions, including cellulitis risk and difficulty with wound healing. Provided printed Lymphedema Workbook for reference.    Person(s) Educated  Patient    Methods  Explanation;Demonstration;Handout;Verbal cues    Comprehension  Verbalized understanding;Returned demonstration                   Patient will benefit from skilled therapeutic intervention in order to improve the following deficits and impairments:     Visit Diagnosis: Lymphedema, not elsewhere classified    Problem List Patient Active Problem List   Diagnosis Date Noted  . Routine general medical examination at a health care facility 12/11/2016  . Primary osteoarthritis of left hip 05/01/2016  . Autoimmune disease ([place]) 11/29/2015  . High risk medication use 11/29/2015  . Vitamin D deficiency  11/29/2015  . Macular degeneration 11/29/2015  . Bilateral lower extremity edema 10/26/2015  . H/O total knee replacement, bilateral 12/09/2013  . Breast mass, left 01/18/2011  . Obesity 12/08/2009  . Venous (peripheral) insufficiency 07/28/2009  . PAIN IN JOINT, ANKLE AND FOOT 01/12/2008  . Obstructive sleep apnea 08/29/2007  . GAIT DISTURBANCE 08/14/2007  . Depression 08/26/2006  . Essential hypertension 08/26/2006  . Primary osteoarthritis of both hands 08/26/2006  . Urinary incontinence 08/26/2006    Andrey Spearman, MS, OTR/L, [facility] 06/26/18 6:19 PM  [place] MAIN [facility] SERVICES [address] [place], [place], [address] Phone: [phone]   Fax:  [phone]  Name: RHEANNA SERGENT MRN: [id number] Date of Birth: 05-08-1945

## 2018-06-26 NOTE — Patient Instructions (Signed)
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
## 2018-07-01 NOTE — Addendum Note (Signed)
Addended by: Ansel Bong on: 07/01/2018 06:58 PM   Modules accepted: Orders

## 2018-07-03 ENCOUNTER — Other Ambulatory Visit: Payer: Self-pay

## 2018-07-03 ENCOUNTER — Ambulatory Visit: Payer: Medicare HMO | Admitting: Occupational Therapy

## 2018-07-03 DIAGNOSIS — I890 Lymphedema, not elsewhere classified: Secondary | ICD-10-CM | POA: Diagnosis not present

## 2018-07-03 NOTE — Patient Instructions (Signed)

## 2018-07-03 NOTE — Therapy (Signed)
[place] MAIN [facility] SERVICES [address] [place], [place], [address] Phone: [phone]   Fax:  [phone]  Occupational Therapy Treatment  Patient Details  Name: Taylor Mccall MRN: [id number] Date of Birth: 1945/03/22 Referring Provider (OT): Dorothyann Peng, NP   Encounter Date: 07/03/2018  OT End of Session - 07/03/18 1446    Visit Number  2    Number of Visits  36    Date for OT Re-Evaluation  09/24/18    OT Start Time  0115    OT Stop Time  0230    OT Time Calculation (min)  75 min    Activity Tolerance  Patient tolerated treatment well;No increased pain    Behavior During Therapy  WFL for tasks assessed/performed       Past Medical History:  Diagnosis Date  . Anxiety   . Breast mass, left   . Depression   . Gait disturbance   . Hypertension   . Obesity   . Osteoarthritis   . Rheumatoid arthritis(714.0)   . Sleep apnea    cpap  . Urinary incontinence   . Venous insufficiency     Past Surgical History:  Procedure Laterality Date  . ABDOMINAL HYSTERECTOMY    . BLADDER SURGERY     sling  . BLADDER SUSPENSION  2009  . BREAST REDUCTION SURGERY    . COLONOSCOPY    . ESOPHAGOGASTRODUODENOSCOPY    . EYE SURGERY    . REPLACEMENT TOTAL KNEE Left   . TOE SURGERY Left    Left great toe  . TOTAL HIP ARTHROPLASTY    . TOTAL KNEE ARTHROPLASTY Right 12/09/2013   Procedure: Right Total Knee Arthroplasty;  Surgeon: Newt Minion, MD;  Location: [facility];  Service: Orthopedics;  Laterality: Right;  Marland Kitchen VESICOVAGINAL FISTULA CLOSURE W/ TAH      There were no vitals filed for this visit.  Subjective Assessment - 07/03/18 1329    Subjective   Taylor Mccall presents for OT vist 2/36 to commence CDT for BLE lymphedema. She is accompanied by Rodena Piety. She /has no new complaints today.    Pertinent History  anxiety, depression gait disturbance, impaired transfers, HTN, OA, RA, OSA incontinence and CVI    Limitations  chronic leg pain  and swelling, difficulty walking, decreased standing and walking tolwerance    Special Tests  positive Stemmer sign bilaterally, L>R    Currently in Pain?  No/denies    Pain Onset  More than a month ago          LYMPHEDEMA/ONCOLOGY QUESTIONNAIRE - 07/03/18 1444      Lymphedema Assessments   Lymphedema Assessments  Lower extremities      Right Lower Extremity Lymphedema   Other  LLE (Rx) limb volume measures 4917.22 ml.      Left Lower Extremity Lymphedema   Other  LLE (Rx) limb volume measures 6237.15 ml.    Other  Limb volume  differential measures 21.16%, L>R              OT Treatments/Exercises (OP) - 07/03/18 0001      ADLs   ADL Education Given  Yes      Manual Therapy   Manual Therapy  Edema management;Compression Bandaging    Edema Management  baseline comparative BLE limb volumetrics    Compression Bandaging  Gradient compression wraps x 3 over Rosidal foam from base of toes to below knee over cotton stockinette. Will modify PRN  OT Education - 07/03/18 1332    Education Details  Provided Pt and family education regarding lymphatic structure and function, etiologies, onset patterns and stages of progression. Discussed  impact of obesity on lymphatic system function. Outlined Complete Decongestive Therapy (CDT)  as standard of care and provided in depth information regarding 4 primary components of both Intensive and Self Management Phases, including Manual Lymph Drainage (MLD), compression wrapping and garments, skin care, and therapeutic exercise.   Pilar Plate discussion of high burden of care and in this case, need for consistent , daily caregiver assistance for optimal clinical outcome. Discussed  Importance of daily, ongoing LE self-care essential to retaining clinical gains and limiting progression.  Lastly, reviewed lymphedema precautions, including cellulitis risk and difficulty with wound healing. Provided printed Lymphedema Workbook for  reference.  (Pended)     Person(s) Educated  Patient  (Pended)     Methods  Explanation;Demonstration;Handout;Verbal cues  (Pended)     Comprehension  Verbalized understanding;Returned demonstration  (Pended)           OT Long Term Goals - 07/01/18 1847      OT LONG TERM GOAL #1   Title  Pt will be able to apply  knee length, multi-layer, short stretch compression wraps (one leg at a time) using correct gradient techniques with modified independence (extra time)  to achieve optimal limb volume reduction, and to return affected limb , as closely as possible, to premorbid size and shape.    Baseline  dependent    Time  4    Period  Days    Status  New    Target Date  --   4th OT Rx visit     OT LONG TERM GOAL #2   Title  Pt to achieve no less than 10% limb volume reductions below knees bilaterally during Intensive Phase CDT to improve AROM for ADLs, safe ambulation and transfers, to reduce infection risk, and to limit LE progression.    Baseline  dependent    Time  12    Period  Weeks    Status  New    Target Date  09/29/18      OT LONG TERM GOAL #3   Title  Pt will achieve  100% compliance with daily LE self-care program with min caregiver assistance throughout Intensive Phase CDT, including daily  skin care, lymphatic pumping therex, simple self-MLD and prescribe compression to ensure optimal limb volume reduction, to limit infection risk and to limit LE progression.    Baseline  dependent    Time  12    Period  Weeks    Status  New    Target Date  09/29/18      OT LONG TERM GOAL #4   Title  Pt will demonstrate modified independence using  assistive devices and extra time  during LE self-care training to don and doff properly fitting, custom compression garments/devices( on order from preferred DME vendor)for optimal LE self-management over time.     Baseline  dependent    Time  12    Period  Weeks    Status  New    Target Date  09/29/18            Plan - 07/03/18  1447    Clinical Impression Statement  Taylor Mccall tolerated all aspects of OT for CDT today. Commenced treatment to most swollen leg, LLE. Baseline BLE comparative limbb volumetrics reveal larger limb volume differential (LVD) than expected at 21.16%, L>R, below the  knees. This large differential is likely contributing to decreased balance and impaired gait. See TREATMENT section for individual limb volume measuruemts calculated today. Applied knee length, layered short stretch compression wraps from base of toes to popliteal fossa on LLE using gradient techniques . Pt participated in all aspects of  OT session. Cont as per OC.    OT Occupational Profile and History  Comprehensive Assessment- Review of records and extensive additional review of physical, cognitive, psychosocial history related to current functional performance    Occupational performance deficits (Please refer to evaluation for details):  ADL's;Work;Other;IADL's;Rest and Sleep;Leisure;Social Participation   functional ambulation and transfers, body image, role  performance   Body Structure / Function / Physical Skills  ADL;Decreased knowledge of precautions;Edema;Obesity;ROM;Skin integrity;Scar mobility;Mobility;IADL;Gait    Psychosocial Skills  Coping Strategies;Habits;Environmental  Adaptations    Rehab Potential  Good    Clinical Decision Making  Several treatment options, min-mod task modification necessary    Comorbidities Affecting Occupational Performance:  Presence of comorbidities impacting occupational performance    Comorbidities impacting occupational performance description:  see SUBJECTIVE for pertinent comorbidities    Modification or Assistance to Complete Evaluation   Min-Moderate modification of tasks or assist with assess necessary to complete eval    OT Frequency  3x / week    OT Duration  12 weeks    OT Treatment/Interventions  Self-care/ADL training;Therapeutic exercise;Energy conservation;Manual lymph  drainage;Scar mobilization;Therapeutic activities;Patient/family education;Compression bandaging;Manual Therapy;DME and/or AE instruction    Plan  CCDT to include manual lymphatic drainage, skin care, comrpession wraps and custom garments once swelling is reduced, and therapeutic exercise for lymphatic pumping . Pt will be instructed in LE self care at all sessions in effort to limit LE progression and to facilitate retention of clinical gains over time.    Consulted and Agree with Plan of Care  Patient       Patient will benefit from skilled therapeutic intervention in order to improve the following deficits and impairments:  Body Structure / Function / Physical Skills, Psychosocial Skills  Visit Diagnosis: Lymphedema, not elsewhere classified    Problem List Patient Active Problem List   Diagnosis Date Noted  . Routine general medical examination at a health care facility 12/11/2016  . Primary osteoarthritis of left hip 05/01/2016  . Autoimmune disease ([place]) 11/29/2015  . High risk medication use 11/29/2015  . Vitamin D deficiency 11/29/2015  . Macular degeneration 11/29/2015  . Bilateral lower extremity edema 10/26/2015  . H/O total knee replacement, bilateral 12/09/2013  . Breast mass, left 01/18/2011  . Obesity 12/08/2009  . Venous (peripheral) insufficiency 07/28/2009  . PAIN IN JOINT, ANKLE AND FOOT 01/12/2008  . Obstructive sleep apnea 08/29/2007  . GAIT DISTURBANCE 08/14/2007  . Depression 08/26/2006  . Essential hypertension 08/26/2006  . Primary osteoarthritis of both hands 08/26/2006  . Urinary incontinence 08/26/2006    Andrey Spearman, Taylor, OTR/L, [facility] 07/03/18 2:52 PM  [place] MAIN [facility] SERVICES [address] [place], [place], [address] Phone: [phone]   Fax:  [phone]  Name: Taylor Mccall MRN: [id number] Date of Birth: 10-03-1945

## 2018-07-07 ENCOUNTER — Encounter: Payer: Self-pay | Admitting: Internal Medicine

## 2018-07-08 ENCOUNTER — Ambulatory Visit: Payer: Medicare HMO | Attending: Adult Health | Admitting: Occupational Therapy

## 2018-07-08 ENCOUNTER — Other Ambulatory Visit: Payer: Self-pay

## 2018-07-08 DIAGNOSIS — I890 Lymphedema, not elsewhere classified: Secondary | ICD-10-CM | POA: Diagnosis not present

## 2018-07-10 ENCOUNTER — Other Ambulatory Visit: Payer: Self-pay

## 2018-07-10 ENCOUNTER — Ambulatory Visit: Payer: Medicare HMO | Admitting: Occupational Therapy

## 2018-07-10 DIAGNOSIS — I890 Lymphedema, not elsewhere classified: Secondary | ICD-10-CM

## 2018-07-10 NOTE — Therapy (Signed)
[place] MAIN [facility] SERVICES [address] [place], [place], [address] Phone: [phone]   Fax:  [phone]  Occupational Therapy Treatment  Patient Details  Name: Taylor Mccall MRN: [id number] Date of Birth: [birth year] Referring Provider (OT): Dorothyann Peng, NP   Encounter Date: 07/10/2018  OT End of Session - 07/10/18 1723    Visit Number  4    Number of Visits  36    Date for OT Re-Evaluation  09/24/18    OT Start Time  0115    OT Stop Time  0220    OT Time Calculation (min)  65 min    Activity Tolerance  Patient tolerated treatment well;No increased pain    Behavior During Therapy  WFL for tasks assessed/performed       Past Medical History:  Diagnosis Date  . Anxiety   . Breast mass, left   . Depression   . Gait disturbance   . Hypertension   . Obesity   . Osteoarthritis   . Rheumatoid arthritis(714.0)   . Sleep apnea    cpap  . Urinary incontinence   . Venous insufficiency     Past Surgical History:  Procedure Laterality Date  . ABDOMINAL HYSTERECTOMY    . BLADDER SURGERY     sling  . BLADDER SUSPENSION  2009  . BREAST REDUCTION SURGERY    . COLONOSCOPY    . ESOPHAGOGASTRODUODENOSCOPY    . EYE SURGERY    . REPLACEMENT TOTAL KNEE Left   . TOE SURGERY Left    Left great toe  . TOTAL HIP ARTHROPLASTY    . TOTAL KNEE ARTHROPLASTY Right 12/09/2013   Procedure: Right Total Knee Arthroplasty;  Surgeon: Newt Minion, MD;  Location: [facility];  Service: Orthopedics;  Laterality: Right;  Marland Kitchen VESICOVAGINAL FISTULA CLOSURE W/ TAH      There were no vitals filed for this visit.  Subjective Assessment - 07/10/18 1721    Subjective   during visit interval in place. Pt is accompanied by her daughter, who is here to learn to assist w compression wrapping  for home program.    Patient is accompanied by:  Family member    Pertinent History  anxiety, depression gait disturbance, impaired transfers, HTN, OA, RA, OSA  incontinence and CVI    Limitations  chronic leg pain and swelling, difficulty walking, decreased standing and walking tolwerance    Special Tests  positive Stemmer sign bilaterally, L>R    Currently in Pain?  Yes    Pain Score  --   unchanged leg pain. not rated numerically   Pain Location  Leg    Pain Orientation  Right;Left    Pain Descriptors / Indicators  Aching;Tender;Sore;Heaviness;Tightness;Tiring    Pain Type  Chronic pain    Pain Onset  More than a month ago                           OT Education - 07/10/18 1722    Education Details  Pt and family edu for gradient compression wrap to LLE- knee high    Person(s) Educated  Patient;Child(ren)    Methods  Explanation;Demonstration;Tactile cues;Verbal cues    Comprehension  Verbalized understanding;Returned demonstration;Verbal cues required;Tactile cues required;Need further instruction          OT Long Term Goals - 07/01/18 1847      OT LONG TERM GOAL #1   Title  Pt will be able to  apply  knee length, multi-layer, short stretch compression wraps (one leg at a time) using correct gradient techniques with modified independence (extra time)  to achieve optimal limb volume reduction, and to return affected limb , as closely as possible, to premorbid size and shape.    Baseline  dependent    Time  4    Period  Days    Status  New    Target Date  --   4th OT Rx visit     OT LONG TERM GOAL #2   Title  Pt to achieve no less than 10% limb volume reductions below knees bilaterally during Intensive Phase CDT to improve AROM for ADLs, safe ambulation and transfers, to reduce infection risk, and to limit LE progression.    Baseline  dependent    Time  12    Period  Weeks    Status  New    Target Date  09/29/18      OT LONG TERM GOAL #3   Title  Pt will achieve  100% compliance with daily LE self-care program with min caregiver assistance throughout Intensive Phase CDT, including daily  skin care, lymphatic  pumping therex, simple self-MLD and prescribe compression to ensure optimal limb volume reduction, to limit infection risk and to limit LE progression.    Baseline  dependent    Time  12    Period  Weeks    Status  New    Target Date  09/29/18      OT LONG TERM GOAL #4   Title  Pt will demonstrate modified independence using  assistive devices and extra time  during LE self-care training to don and doff properly fitting, custom compression garments/devices( on order from preferred DME vendor)for optimal LE self-management over time.     Baseline  dependent    Time  12    Period  Weeks    Status  New    Target Date  09/29/18            Plan - 07/10/18 1724    Clinical Impression Statement  By end of session Pt and daughter able to apply ss compression wraps using correct gradient techniques with min A. Limb volume further reduced in LLE by visual assessment again today. skin wroinkles present. Cont as per POC.    OT Occupational Profile and History  Comprehensive Assessment- Review of records and extensive additional review of physical, cognitive, psychosocial history related to current functional performance    Occupational performance deficits (Please refer to evaluation for details):  ADL's;Work;Other;IADL's;Rest and Sleep;Leisure;Social Participation   functional ambulation and transfers, body image, role  performance   Body Structure / Function / Physical Skills  ADL;Decreased knowledge of precautions;Edema;Obesity;ROM;Skin integrity;Scar mobility;Mobility;IADL;Gait    Psychosocial Skills  Coping Strategies;Habits;Environmental  Adaptations    Rehab Potential  Good    Clinical Decision Making  Several treatment options, min-mod task modification necessary    Comorbidities Affecting Occupational Performance:  Presence of comorbidities impacting occupational performance    Comorbidities impacting occupational performance description:  see SUBJECTIVE for pertinent comorbidities     Modification or Assistance to Complete Evaluation   Min-Moderate modification of tasks or assist with assess necessary to complete eval    OT Frequency  3x / week    OT Duration  12 weeks    OT Treatment/Interventions  Self-care/ADL training;Therapeutic exercise;Energy conservation;Manual lymph drainage;Scar mobilization;Therapeutic activities;Patient/family education;Compression bandaging;Manual Therapy;DME and/or AE instruction    Plan  CCDT to include manual lymphatic drainage,  skin care, comrpession wraps and custom garments once swelling is reduced, and therapeutic exercise for lymphatic pumping . Pt will be instructed in LE self care at all sessions in effort to limit LE progression and to facilitate retention of clinical gains over time.    Consulted and Agree with Plan of Care  Patient       Patient will benefit from skilled therapeutic intervention in order to improve the following deficits and impairments:  Body Structure / Function / Physical Skills, Psychosocial Skills  Visit Diagnosis: Lymphedema, not elsewhere classified    Problem List Patient Active Problem List   Diagnosis Date Noted  . Routine general medical examination at a health care facility 12/11/2016  . Primary osteoarthritis of left hip 05/01/2016  . Autoimmune disease ([place]) 11/29/2015  . High risk medication use 11/29/2015  . Vitamin D deficiency 11/29/2015  . Macular degeneration 11/29/2015  . Bilateral lower extremity edema 10/26/2015  . H/O total knee replacement, bilateral 12/09/2013  . Breast mass, left 01/18/2011  . Obesity 12/08/2009  . Venous (peripheral) insufficiency 07/28/2009  . PAIN IN JOINT, ANKLE AND FOOT 01/12/2008  . Obstructive sleep apnea 08/29/2007  . GAIT DISTURBANCE 08/14/2007  . Depression 08/26/2006  . Essential hypertension 08/26/2006  . Primary osteoarthritis of both hands 08/26/2006  . Urinary incontinence 08/26/2006    Andrey Spearman, MS, OTR/L, [facility] 07/10/18 5:25  PM  [place] MAIN [facility] SERVICES [address] [place], [place], [address] Phone: [phone]   Fax:  [phone]  Name: Taylor Mccall MRN: [id number] Date of Birth: 06-26-1945

## 2018-07-10 NOTE — Therapy (Signed)
[place] MAIN [facility] SERVICES [address] [place], [place], [address] Phone: [phone]   Fax:  [phone]  Occupational Therapy Treatment  Patient Details  Name: Taylor Mccall MRN: [id number] Date of Birth: 09/24/1945 Referring Provider (OT): Dorothyann Peng, NP   Encounter Date: 07/08/2018  OT End of Session - 07/10/18 1424    Visit Number  3    Number of Visits  36    Date for OT Re-Evaluation  09/24/18    OT Start Time  0115    OT Stop Time  0220    OT Time Calculation (min)  65 min    Activity Tolerance  Patient tolerated treatment well;No increased pain    Behavior During Therapy  WFL for tasks assessed/performed       Past Medical History:  Diagnosis Date  . Anxiety   . Breast mass, left   . Depression   . Gait disturbance   . Hypertension   . Obesity   . Osteoarthritis   . Rheumatoid arthritis(714.0)   . Sleep apnea    cpap  . Urinary incontinence   . Venous insufficiency     Past Surgical History:  Procedure Laterality Date  . ABDOMINAL HYSTERECTOMY    . BLADDER SURGERY     sling  . BLADDER SUSPENSION  2009  . BREAST REDUCTION SURGERY    . COLONOSCOPY    . ESOPHAGOGASTRODUODENOSCOPY    . EYE SURGERY    . REPLACEMENT TOTAL KNEE Left   . TOE SURGERY Left    Left great toe  . TOTAL HIP ARTHROPLASTY    . TOTAL KNEE ARTHROPLASTY Right 12/09/2013   Procedure: Right Total Knee Arthroplasty;  Surgeon: Newt Minion, MD;  Location: [place];  Service: Orthopedics;  Laterality: Right;  Marland Kitchen VESICOVAGINAL FISTULA CLOSURE W/ TAH      There were no vitals filed for this visit.                OT Treatments/Exercises (OP) - 07/10/18 0001      ADLs   ADL Education Given  Yes      Manual Therapy   Manual Therapy  Edema management;Compression Bandaging    Compression Bandaging  Gradient compression wraps x 3 over Rosidal foam from base of toes to below knee over cotton stockinette. Will modify PRN                   OT Long Term Goals - 07/01/18 1847      OT LONG TERM GOAL #1   Title  Pt will be able to apply  knee length, multi-layer, short stretch compression wraps (one leg at a time) using correct gradient techniques with modified independence (extra time)  to achieve optimal limb volume reduction, and to return affected limb , as closely as possible, to premorbid size and shape.    Baseline  dependent    Time  4    Period  Days    Status  New    Target Date  --   4th OT Rx visit     OT LONG TERM GOAL #2   Title  Pt to achieve no less than 10% limb volume reductions below knees bilaterally during Intensive Phase CDT to improve AROM for ADLs, safe ambulation and transfers, to reduce infection risk, and to limit LE progression.    Baseline  dependent    Time  12    Period  Weeks    Status  New    Target Date  09/29/18      OT LONG TERM GOAL #3   Title  Pt will achieve  100% compliance with daily LE self-care program with min caregiver assistance throughout Intensive Phase CDT, including daily  skin care, lymphatic pumping therex, simple self-MLD and prescribe compression to ensure optimal limb volume reduction, to limit infection risk and to limit LE progression.    Baseline  dependent    Time  12    Period  Weeks    Status  New    Target Date  09/29/18      OT LONG TERM GOAL #4   Title  Pt will demonstrate modified independence using  assistive devices and extra time  during LE self-care training to don and doff properly fitting, custom compression garments/devices( on order from preferred DME vendor)for optimal LE self-management over time.     Baseline  dependent    Time  12    Period  Weeks    Status  New    Target Date  09/29/18            Plan - 07/10/18 1424    Clinical Impression Statement  By end of session Pt and daughter able to apply ss compression wraps using correct gradient techniques with min A. Limb volume further reduced in LLE by visual  assessment again today. skin wroinkles present. Cont as per POC.    OT Occupational Profile and History  Comprehensive Assessment- Review of records and extensive additional review of physical, cognitive, psychosocial history related to current functional performance    Occupational performance deficits (Please refer to evaluation for details):  ADL's;Work;Other;IADL's;Rest and Sleep;Leisure;Social Participation   functional ambulation and transfers, body image, role  performance   Body Structure / Function / Physical Skills  ADL;Decreased knowledge of precautions;Edema;Obesity;ROM;Skin integrity;Scar mobility;Mobility;IADL;Gait    Psychosocial Skills  Coping Strategies;Habits;Environmental  Adaptations    Rehab Potential  Good    Clinical Decision Making  Several treatment options, min-mod task modification necessary    Comorbidities Affecting Occupational Performance:  Presence of comorbidities impacting occupational performance    Comorbidities impacting occupational performance description:  see SUBJECTIVE for pertinent comorbidities    Modification or Assistance to Complete Evaluation   Min-Moderate modification of tasks or assist with assess necessary to complete eval    OT Frequency  3x / week    OT Duration  12 weeks    OT Treatment/Interventions  Self-care/ADL training;Therapeutic exercise;Energy conservation;Manual lymph drainage;Scar mobilization;Therapeutic activities;Patient/family education;Compression bandaging;Manual Therapy;DME and/or AE instruction    Plan  CCDT to include manual lymphatic drainage, skin care, comrpession wraps and custom garments once swelling is reduced, and therapeutic exercise for lymphatic pumping . Pt will be instructed in LE self care at all sessions in effort to limit LE progression and to facilitate retention of clinical gains over time.    Consulted and Agree with Plan of Care  Patient       Patient will benefit from skilled therapeutic intervention in  order to improve the following deficits and impairments:  Body Structure / Function / Physical Skills, Psychosocial Skills  Visit Diagnosis: Lymphedema, not elsewhere classified    Problem List Patient Active Problem List   Diagnosis Date Noted  . Routine general medical examination at a health care facility 12/11/2016  . Primary osteoarthritis of left hip 05/01/2016  . Autoimmune disease ([place]) 11/29/2015  . High risk medication use 11/29/2015  . Vitamin D deficiency 11/29/2015  . Macular degeneration 11/29/2015  .  Bilateral lower extremity edema 10/26/2015  . H/O total knee replacement, bilateral 12/09/2013  . Breast mass, left 01/18/2011  . Obesity 12/08/2009  . Venous (peripheral) insufficiency 07/28/2009  . PAIN IN JOINT, ANKLE AND FOOT 01/12/2008  . Obstructive sleep apnea 08/29/2007  . GAIT DISTURBANCE 08/14/2007  . Depression 08/26/2006  . Essential hypertension 08/26/2006  . Primary osteoarthritis of both hands 08/26/2006  . Urinary incontinence 08/26/2006    Andrey Spearman, MS, OTR/L, [facility] 07/10/18 2:29 PM  [place] MAIN [facility] SERVICES [address] [place], [place], [address] Phone: [phone]   Fax:  [phone]  Name: Taylor Mccall MRN: [id number] Date of Birth: 19-Aug-1945

## 2018-07-15 ENCOUNTER — Other Ambulatory Visit: Payer: Self-pay

## 2018-07-15 ENCOUNTER — Ambulatory Visit: Payer: Medicare HMO | Admitting: Occupational Therapy

## 2018-07-15 DIAGNOSIS — I890 Lymphedema, not elsewhere classified: Secondary | ICD-10-CM

## 2018-07-15 NOTE — Therapy (Signed)
[place] MAIN [facility] SERVICES [address] [place], [place], [address] Phone: [phone]   Fax:  [phone]  Occupational Therapy Treatment  Patient Details  Name: Taylor Mccall MRN: [id number] Date of Birth: [birth year] Referring Provider (OT): Dorothyann Peng, NP   Encounter Date: 07/15/2018  OT End of Session - 07/15/18 1738    Visit Number  5    Number of Visits  36    Date for OT Re-Evaluation  09/24/18    OT Start Time  0130    OT Stop Time  0215    OT Time Calculation (min)  45 min    Activity Tolerance  Patient tolerated treatment well;No increased pain    Behavior During Therapy  WFL for tasks assessed/performed       Past Medical History:  Diagnosis Date  . Anxiety   . Breast mass, left   . Depression   . Gait disturbance   . Hypertension   . Obesity   . Osteoarthritis   . Rheumatoid arthritis(714.0)   . Sleep apnea    cpap  . Urinary incontinence   . Venous insufficiency     Past Surgical History:  Procedure Laterality Date  . ABDOMINAL HYSTERECTOMY    . BLADDER SURGERY     sling  . BLADDER SUSPENSION  2009  . BREAST REDUCTION SURGERY    . COLONOSCOPY    . ESOPHAGOGASTRODUODENOSCOPY    . EYE SURGERY    . REPLACEMENT TOTAL KNEE Left   . TOE SURGERY Left    Left great toe  . TOTAL HIP ARTHROPLASTY    . TOTAL KNEE ARTHROPLASTY Right 12/09/2013   Procedure: Right Total Knee Arthroplasty;  Surgeon: Newt Minion, MD;  Location: [place];  Service: Orthopedics;  Laterality: Right;  Marland Kitchen VESICOVAGINAL FISTULA CLOSURE W/ TAH      There were no vitals filed for this visit.  Subjective Assessment - 07/15/18 1734    Subjective   Ms Taylor Mccall presents for OT visit 5/36 to address BLE LE. Pt presents with compression wraps in place on LLE. "My dauhter wrapped it for me, but she has to be at work at 6:30, and I dont want to get up that early. I wrapped it myself today."    Patient is accompanied by:  Family member    Pertinent History  anxiety, depression gait disturbance, impaired transfers, HTN, OA, RA, OSA incontinence and CVI    Limitations  chronic leg pain and swelling, difficulty walking, decreased standing and walking tolwerance    Special Tests  positive Stemmer sign bilaterally, L>R    Currently in Pain?  No/denies    Pain Location  Leg    Pain Orientation  Right;Left    Pain Descriptors / Indicators  Sore;Pressure;Aching;Tiring;Discomfort;Heaviness;Tightness    Pain Type  Chronic pain    Pain Onset  More than a month ago                   OT Treatments/Exercises (OP) - 07/15/18 0001      ADLs   ADL Education Given  Yes      Manual Therapy   Manual Therapy  Edema management;Manual Lymphatic Drainage (MLD);Compression Bandaging    Manual Lymphatic Drainage (MLD)  MLD to LLE from proximal to distal utilizing short neck sequence bilaterally, deep abdominal breathing by patient, and functional inguinal pathways. No difficulty tolerating manual therapy observed.    Compression Bandaging  Gradient compression wraps x 3 over Rosidal foam  from base of toes to below knee over cotton stockinette. Will modify PRN             OT Education - 07/15/18 1738    Education Details  INtro level Pt edu   for simple self MLD    Person(s) Educated  Patient;Child(ren)    Methods  Explanation;Demonstration;Tactile cues;Verbal cues    Comprehension  Verbalized understanding;Returned demonstration;Verbal cues required;Tactile cues required;Need further instruction          OT Long Term Goals - 07/01/18 1847      OT LONG TERM GOAL #1   Title  Pt will be able to apply  knee length, multi-layer, short stretch compression wraps (one leg at a time) using correct gradient techniques with modified independence (extra time)  to achieve optimal limb volume reduction, and to return affected limb , as closely as possible, to premorbid size and shape.    Baseline  dependent    Time  4    Period   Days    Status  New    Target Date  --   4th OT Rx visit     OT LONG TERM GOAL #2   Title  Pt to achieve no less than 10% limb volume reductions below knees bilaterally during Intensive Phase CDT to improve AROM for ADLs, safe ambulation and transfers, to reduce infection risk, and to limit LE progression.    Baseline  dependent    Time  12    Period  Weeks    Status  New    Target Date  09/29/18      OT LONG TERM GOAL #3   Title  Pt will achieve  100% compliance with daily LE self-care program with min caregiver assistance throughout Intensive Phase CDT, including daily  skin care, lymphatic pumping therex, simple self-MLD and prescribe compression to ensure optimal limb volume reduction, to limit infection risk and to limit LE progression.    Baseline  dependent    Time  12    Period  Weeks    Status  New    Target Date  09/29/18      OT LONG TERM GOAL #4   Title  Pt will demonstrate modified independence using  assistive devices and extra time  during LE self-care training to don and doff properly fitting, custom compression garments/devices( on order from preferred DME vendor)for optimal LE self-management over time.     Baseline  dependent    Time  12    Period  Weeks    Status  New    Target Date  09/29/18            Plan - 07/15/18 1738    Clinical Impression Statement  LLE swelling below the knee apears slightly more reduced today. Fatty lateral ankle pads also palpably softer. Pt requires additional training for self wrapping. Cont as per POC.    OT Occupational Profile and History  Comprehensive Assessment- Review of records and extensive additional review of physical, cognitive, psychosocial history related to current functional performance    Occupational performance deficits (Please refer to evaluation for details):  ADL's;Work;Other;IADL's;Rest and Sleep;Leisure;Social Participation   functional ambulation and transfers, body image, role  performance   Body  Structure / Function / Physical Skills  ADL;Decreased knowledge of precautions;Edema;Obesity;ROM;Skin integrity;Scar mobility;Mobility;IADL;Gait    Psychosocial Skills  Coping Strategies;Habits;Environmental  Adaptations    Rehab Potential  Good    Clinical Decision Making  Several treatment options, min-mod task modification  necessary    Comorbidities Affecting Occupational Performance:  Presence of comorbidities impacting occupational performance    Comorbidities impacting occupational performance description:  see SUBJECTIVE for pertinent comorbidities    Modification or Assistance to Complete Evaluation   Min-Moderate modification of tasks or assist with assess necessary to complete eval    OT Frequency  3x / week    OT Duration  12 weeks    OT Treatment/Interventions  Self-care/ADL training;Therapeutic exercise;Energy conservation;Manual lymph drainage;Scar mobilization;Therapeutic activities;Patient/family education;Compression bandaging;Manual Therapy;DME and/or AE instruction    Plan  CCDT to include manual lymphatic drainage, skin care, comrpession wraps and custom garments once swelling is reduced, and therapeutic exercise for lymphatic pumping . Pt will be instructed in LE self care at all sessions in effort to limit LE progression and to facilitate retention of clinical gains over time.    Consulted and Agree with Plan of Care  Patient       Patient will benefit from skilled therapeutic intervention in order to improve the following deficits and impairments:  Body Structure / Function / Physical Skills, Psychosocial Skills  Visit Diagnosis: Lymphedema, not elsewhere classified    Problem List Patient Active Problem List   Diagnosis Date Noted  . Routine general medical examination at a health care facility 12/11/2016  . Primary osteoarthritis of left hip 05/01/2016  . Autoimmune disease ([place]) 11/29/2015  . High risk medication use 11/29/2015  . Vitamin D deficiency 11/29/2015   . Macular degeneration 11/29/2015  . Bilateral lower extremity edema 10/26/2015  . H/O total knee replacement, bilateral 12/09/2013  . Breast mass, left 01/18/2011  . Obesity 12/08/2009  . Venous (peripheral) insufficiency 07/28/2009  . PAIN IN JOINT, ANKLE AND FOOT 01/12/2008  . Obstructive sleep apnea 08/29/2007  . GAIT DISTURBANCE 08/14/2007  . Depression 08/26/2006  . Essential hypertension 08/26/2006  . Primary osteoarthritis of both hands 08/26/2006  . Urinary incontinence 08/26/2006    Andrey Spearman, MS, OTR/L, [facility] 07/15/18 5:41 PM  [place] MAIN [facility] SERVICES [address] [place], [place], [address] Phone: [phone]   Fax:  [phone]  Name: DAFINA SUK MRN: [id number] Date of Birth: 28-Oct-1945

## 2018-07-17 ENCOUNTER — Ambulatory Visit: Payer: Medicare HMO | Admitting: Occupational Therapy

## 2018-07-17 ENCOUNTER — Other Ambulatory Visit: Payer: Self-pay

## 2018-07-17 DIAGNOSIS — I890 Lymphedema, not elsewhere classified: Secondary | ICD-10-CM

## 2018-07-17 NOTE — Therapy (Signed)
[place] MAIN [facility] SERVICES [address] [place], [place], [address] Phone: [phone]   Fax:  [phone]  Occupational Therapy Treatment  Patient Details  Name: Taylor Mccall MRN: [id number] Date of Birth: [birth year] Referring Provider (OT): Dorothyann Peng, NP   Encounter Date: 07/17/2018  OT End of Session - 07/17/18 1734    Visit Number  6    Number of Visits  36    Date for OT Re-Evaluation  09/24/18    OT Start Time  0115    OT Stop Time  0220    OT Time Calculation (min)  65 min    Activity Tolerance  Patient tolerated treatment well;No increased pain    Behavior During Therapy  WFL for tasks assessed/performed       Past Medical History:  Diagnosis Date  . Anxiety   . Breast mass, left   . Depression   . Gait disturbance   . Hypertension   . Obesity   . Osteoarthritis   . Rheumatoid arthritis(714.0)   . Sleep apnea    cpap  . Urinary incontinence   . Venous insufficiency     Past Surgical History:  Procedure Laterality Date  . ABDOMINAL HYSTERECTOMY    . BLADDER SURGERY     sling  . BLADDER SUSPENSION  2009  . BREAST REDUCTION SURGERY    . COLONOSCOPY    . ESOPHAGOGASTRODUODENOSCOPY    . EYE SURGERY    . REPLACEMENT TOTAL KNEE Left   . TOE SURGERY Left    Left great toe  . TOTAL HIP ARTHROPLASTY    . TOTAL KNEE ARTHROPLASTY Right 12/09/2013   Procedure: Right Total Knee Arthroplasty;  Surgeon: Newt Minion, MD;  Location: [place];  Service: Orthopedics;  Laterality: Right;  Marland Kitchen VESICOVAGINAL FISTULA CLOSURE W/ TAH      There were no vitals filed for this visit.  Subjective Assessment - 07/17/18 1733    Subjective   Taylor Mccall presents for OT visit 6/36 to address BLE LE. Pt presents with compression wraps in place on LLE. No new complaints. Good tolerance for wraps during interval.    Patient is accompanied by:  Family member    Pertinent History  anxiety, depression gait disturbance, impaired  transfers, HTN, OA, RA, OSA incontinence and CVI    Limitations  chronic leg pain and swelling, difficulty walking, decreased standing and walking tolwerance    Special Tests  positive Stemmer sign bilaterally, L>R    Pain Onset  More than a month ago                   OT Treatments/Exercises (OP) - 07/17/18 0001      ADLs   ADL Education Given  Yes      Manual Therapy   Manual Therapy  Edema management;Manual Lymphatic Drainage (MLD);Compression Bandaging    Manual Lymphatic Drainage (MLD)  MLD to LLE from proximal to distal utilizing short neck sequence bilaterally, deep abdominal breathing by patient, and functional inguinal pathways. No difficulty tolerating manual therapy observed.    Compression Bandaging  Gradient compression wraps x 3 over Rosidal foam from base of toes to below knee over cotton stockinette. Will modify PRN             OT Education - 07/17/18 1734    Education Details  Continued skilled Pt/caregiver education  And LE ADL training throughout visit for lymphedema self care/ home program, including compression wrapping, compression garment  and device wear/care, lymphatic pumping ther ex, simple self-MLD, and skin care. Discussed progress towards goals.    Person(s) Educated  Patient;Child(ren)    Methods  Explanation;Demonstration;Tactile cues;Verbal cues    Comprehension  Verbalized understanding;Returned demonstration;Verbal cues required;Tactile cues required;Need further instruction          OT Long Term Goals - 07/01/18 1847      OT LONG TERM GOAL #1   Title  Pt will be able to apply  knee length, multi-layer, short stretch compression wraps (one leg at a time) using correct gradient techniques with modified independence (extra time)  to achieve optimal limb volume reduction, and to return affected limb , as closely as possible, to premorbid size and shape.    Baseline  dependent    Time  4    Period  Days    Status  New    Target  Date  --   4th OT Rx visit     OT LONG TERM GOAL #2   Title  Pt to achieve no less than 10% limb volume reductions below knees bilaterally during Intensive Phase CDT to improve AROM for ADLs, safe ambulation and transfers, to reduce infection risk, and to limit LE progression.    Baseline  dependent    Time  12    Period  Weeks    Status  New    Target Date  09/29/18      OT LONG TERM GOAL #3   Title  Pt will achieve  100% compliance with daily LE self-care program with min caregiver assistance throughout Intensive Phase CDT, including daily  skin care, lymphatic pumping therex, simple self-MLD and prescribe compression to ensure optimal limb volume reduction, to limit infection risk and to limit LE progression.    Baseline  dependent    Time  12    Period  Weeks    Status  New    Target Date  09/29/18      OT LONG TERM GOAL #4   Title  Pt will demonstrate modified independence using  assistive devices and extra time  during LE self-care training to don and doff properly fitting, custom compression garments/devices( on order from preferred DME vendor)for optimal LE self-management over time.     Baseline  dependent    Time  12    Period  Weeks    Status  New    Target Date  09/29/18            Plan - 07/17/18 1735    Clinical Impression Statement  Pt demonstrates good tolerance for and compliance with compression regime. Additional limb volume reduction observed again today. Caregiver consistently providing assistance with wraps. Cont as per POC.    OT Occupational Profile and History  Comprehensive Assessment- Review of records and extensive additional review of physical, cognitive, psychosocial history related to current functional performance    Occupational performance deficits (Please refer to evaluation for details):  ADL's;Work;Other;IADL's;Rest and Sleep;Leisure;Social Participation   functional ambulation and transfers, body image, role  performance   Body Structure /  Function / Physical Skills  ADL;Decreased knowledge of precautions;Edema;Obesity;ROM;Skin integrity;Scar mobility;Mobility;IADL;Gait    Psychosocial Skills  Coping Strategies;Habits;Environmental  Adaptations    Rehab Potential  Good    Clinical Decision Making  Several treatment options, min-mod task modification necessary    Comorbidities Affecting Occupational Performance:  Presence of comorbidities impacting occupational performance    Comorbidities impacting occupational performance description:  see SUBJECTIVE for pertinent comorbidities  Modification or Assistance to Complete Evaluation   Min-Moderate modification of tasks or assist with assess necessary to complete eval    OT Frequency  3x / week    OT Duration  12 weeks    OT Treatment/Interventions  Self-care/ADL training;Therapeutic exercise;Energy conservation;Manual lymph drainage;Scar mobilization;Therapeutic activities;Patient/family education;Compression bandaging;Manual Therapy;DME and/or AE instruction    Plan  CCDT to include manual lymphatic drainage, skin care, comrpession wraps and custom garments once swelling is reduced, and therapeutic exercise for lymphatic pumping . Pt will be instructed in LE self care at all sessions in effort to limit LE progression and to facilitate retention of clinical gains over time.    Consulted and Agree with Plan of Care  Patient       Patient will benefit from skilled therapeutic intervention in order to improve the following deficits and impairments:  Body Structure / Function / Physical Skills, Psychosocial Skills  Visit Diagnosis: Lymphedema, not elsewhere classified    Problem List Patient Active Problem List   Diagnosis Date Noted  . Routine general medical examination at a health care facility 12/11/2016  . Primary osteoarthritis of left hip 05/01/2016  . Autoimmune disease ([place]) 11/29/2015  . High risk medication use 11/29/2015  . Vitamin D deficiency 11/29/2015  . Macular  degeneration 11/29/2015  . Bilateral lower extremity edema 10/26/2015  . H/O total knee replacement, bilateral 12/09/2013  . Breast mass, left 01/18/2011  . Obesity 12/08/2009  . Venous (peripheral) insufficiency 07/28/2009  . PAIN IN JOINT, ANKLE AND FOOT 01/12/2008  . Obstructive sleep apnea 08/29/2007  . GAIT DISTURBANCE 08/14/2007  . Depression 08/26/2006  . Essential hypertension 08/26/2006  . Primary osteoarthritis of both hands 08/26/2006  . Urinary incontinence 08/26/2006    Andrey Spearman, [place], OTR/L, [facility] 07/17/18 5:37 PM  [place] MAIN [facility] SERVICES [address] [place], [place], [address] Phone: [phone]   Fax:  [phone]  Name: Taylor Mccall MRN: [id number] Date of Birth: 02-21-1945

## 2018-07-21 ENCOUNTER — Other Ambulatory Visit: Payer: Self-pay

## 2018-07-21 ENCOUNTER — Ambulatory Visit (AMBULATORY_SURGERY_CENTER): Payer: Self-pay

## 2018-07-21 VITALS — Ht 70.0 in | Wt 275.0 lb

## 2018-07-21 DIAGNOSIS — Z1211 Encounter for screening for malignant neoplasm of colon: Secondary | ICD-10-CM

## 2018-07-21 NOTE — Progress Notes (Signed)
Denies allergies to eggs or soy products. Denies complication of anesthesia or sedation. Denies use of weight loss medication. Denies use of O2.   Emmi instructions given for colonoscopy.  Pre-Visit was conducted by phone due to Covid 19. Instructions were reviewed and mailed to patients confirmed home address. Patient was encouraged to call if she had any questions or concerns regarding instructions.  

## 2018-07-22 ENCOUNTER — Ambulatory Visit: Payer: Medicare HMO | Admitting: Occupational Therapy

## 2018-07-22 DIAGNOSIS — I890 Lymphedema, not elsewhere classified: Secondary | ICD-10-CM

## 2018-07-22 NOTE — Therapy (Signed)
[place] MAIN [facility] SERVICES [address] [place], [place], [address] Phone: [phone]   Fax:  [phone]  Occupational Therapy Treatment  Patient Details  Name: Taylor Mccall MRN: [id number] Date of Birth: 08/09/1945 Referring Provider (OT): Taylor Peng, NP   Encounter Date: 07/22/2018  OT End of Session - 07/22/18 1756    Visit Number  7    Number of Visits  36    Date for OT Re-Evaluation  09/24/18    OT Start Time  0325    OT Stop Time  0415    OT Time Calculation (min)  50 min    Activity Tolerance  Patient tolerated treatment well;No increased pain    Behavior During Therapy  WFL for tasks assessed/performed       Past Medical History:  Diagnosis Date  . Anxiety   . Blood transfusion without reported diagnosis   . Breast mass, left   . Cataract   . Depression   . Gait disturbance   . GERD (gastroesophageal reflux disease)   . Hypertension   . Obesity   . Osteoarthritis   . Rheumatoid arthritis(714.0)   . Sleep apnea    cpap  . Urinary incontinence   . Venous insufficiency     Past Surgical History:  Procedure Laterality Date  . ABDOMINAL HYSTERECTOMY    . BLADDER SURGERY     sling  . BLADDER SUSPENSION  2009  . BREAST REDUCTION SURGERY    . COLONOSCOPY    . ESOPHAGOGASTRODUODENOSCOPY    . EYE SURGERY    . REPLACEMENT TOTAL KNEE Left   . TOE SURGERY Left    Left great toe  . TOTAL HIP ARTHROPLASTY    . TOTAL KNEE ARTHROPLASTY Right 12/09/2013   Procedure: Right Total Knee Arthroplasty;  Surgeon: Newt Minion, MD;  Location: [place];  Service: Orthopedics;  Laterality: Right;  Marland Kitchen VESICOVAGINAL FISTULA CLOSURE W/ TAH      There were no vitals filed for this visit.  Subjective Assessment - 07/22/18 1754    Subjective   [name] presents for OT visit 7/36 to address BLE LE. Pt presents without compression wraps in place on LLE.She uis accompanied by her daughter.    Patient is accompanied by:   Family member    Pertinent History  anxiety, depression gait disturbance, impaired transfers, HTN, OA, RA, OSA incontinence and CVI    Limitations  chronic leg pain and swelling, difficulty walking, decreased standing and walking tolwerance    Special Tests  positive Stemmer sign bilaterally, L>R    Pain Onset  More than a month ago                   OT Treatments/Exercises (OP) - 07/22/18 0001      ADLs   ADL Education Given  Yes      Manual Therapy   Manual Therapy  Edema management;Manual Lymphatic Drainage (MLD);Compression Bandaging    Manual Lymphatic Drainage (MLD)  MLD to LLE from proximal to distal utilizing short neck sequence bilaterally, deep abdominal breathing by patient, and functional inguinal pathways. No difficulty tolerating manual therapy observed.    Compression Bandaging  Gradient compression wraps x 3 over Rosidal foam from base of toes to below knee over cotton stockinette. Will modify PRN             OT Education - 07/22/18 1756    Education Details  Continued skilled Pt/caregiver education  And LE  ADL training throughout visit for lymphedema self care/ home program, including compression wrapping, compression garment and device wear/care, lymphatic pumping ther ex, simple self-MLD, and skin care. Discussed progress towards goals.    Person(s) Educated  Patient;Child(ren)    Methods  Explanation;Demonstration;Tactile cues;Verbal cues    Comprehension  Verbalized understanding;Returned demonstration;Verbal cues required;Tactile cues required;Need further instruction          OT Long Term Goals - 07/01/18 1847      OT LONG TERM GOAL #1   Title  Pt will be able to apply  knee length, multi-layer, short stretch compression wraps (one leg at a time) using correct gradient techniques with modified independence (extra time)  to achieve optimal limb volume reduction, and to return affected limb , as closely as possible, to premorbid size and shape.     Baseline  dependent    Time  4    Period  Days    Status  New    Target Date  --   4th OT Rx visit     OT LONG TERM GOAL #2   Title  Pt to achieve no less than 10% limb volume reductions below knees bilaterally during Intensive Phase CDT to improve AROM for ADLs, safe ambulation and transfers, to reduce infection risk, and to limit LE progression.    Baseline  dependent    Time  12    Period  Weeks    Status  New    Target Date  09/29/18      OT LONG TERM GOAL #3   Title  Pt will achieve  100% compliance with daily LE self-care program with min caregiver assistance throughout Intensive Phase CDT, including daily  skin care, lymphatic pumping therex, simple self-MLD and prescribe compression to ensure optimal limb volume reduction, to limit infection risk and to limit LE progression.    Baseline  dependent    Time  12    Period  Weeks    Status  New    Target Date  09/29/18      OT LONG TERM GOAL #4   Title  Pt will demonstrate modified independence using  assistive devices and extra time  during LE self-care training to don and doff properly fitting, custom compression garments/devices( on order from preferred DME vendor)for optimal LE self-management over time.     Baseline  dependent    Time  12    Period  Weeks    Status  New    Target Date  09/29/18            Plan - 07/22/18 1757    Clinical Impression Statement  Pt tolerating compression between       visits and building compliance. She tolerates MLD in clinic. She co0ntinues to have some difficulty applying compression wraps   herself. but she responded positively to encouragement to leep practicing and moving forward. Limb volume remains reduced above distal leg such  that fatty fibrosis is more easily palpated in this region and at ankle. Swelling at ankles, medial and lateral, is reduces               by visual assessment since adding supplemental foam pads   to  compression wrap. Cont as per POC.    OT  Occupational Profile and History  Comprehensive Assessment- Review of records and extensive additional review of physical, cognitive, psychosocial history related to current functional performance    Occupational performance deficits (Please refer to evaluation for details):  ADL's;Work;Other;IADL's;Rest and Sleep;Leisure;Social Participation   functional ambulation and transfers, body image, role  performance   Body Structure / Function / Physical Skills  ADL;Decreased knowledge of precautions;Edema;Obesity;ROM;Skin integrity;Scar mobility;Mobility;IADL;Gait    Psychosocial Skills  Coping Strategies;Habits;Environmental  Adaptations    Rehab Potential  Good    Clinical Decision Making  Several treatment options, min-mod task modification necessary    Comorbidities Affecting Occupational Performance:  Presence of comorbidities impacting occupational performance    Comorbidities impacting occupational performance description:  see SUBJECTIVE for pertinent comorbidities    Modification or Assistance to Complete Evaluation   Min-Moderate modification of tasks or assist with assess necessary to complete eval    OT Frequency  3x / week    OT Duration  12 weeks    OT Treatment/Interventions  Self-care/ADL training;Therapeutic exercise;Energy conservation;Manual lymph drainage;Scar mobilization;Therapeutic activities;Patient/family education;Compression bandaging;Manual Therapy;DME and/or AE instruction    Plan  CCDT to include manual lymphatic drainage, skin care, comrpession wraps and custom garments once swelling is reduced, and therapeutic exercise for lymphatic pumping . Pt will be instructed in LE self care at all sessions in effort to limit LE progression and to facilitate retention of clinical gains over time.    Consulted and Agree with Plan of Care  Patient       Patient will benefit from skilled therapeutic intervention in order to improve the following deficits and impairments:   Body  Structure / Function / Physical Skills: ADL, Decreased knowledge of precautions, Edema, Obesity, ROM, Skin integrity, Scar mobility, Mobility, IADL, Gait   Psychosocial Skills: Coping Strategies, Habits, Environmental  Adaptations   Visit Diagnosis: 1. Lymphedema, not elsewhere classified       Problem List Patient Active Problem List   Diagnosis Date Noted  . Routine general medical examination at a health care facility 12/11/2016  . Primary osteoarthritis of left hip 05/01/2016  . Autoimmune disease ([place]) 11/29/2015  . High risk medication use 11/29/2015  . Vitamin D deficiency 11/29/2015  . Macular degeneration 11/29/2015  . Bilateral lower extremity edema 10/26/2015  . H/O total knee replacement, bilateral 12/09/2013  . Breast mass, left 01/18/2011  . Obesity 12/08/2009  . Venous (peripheral) insufficiency 07/28/2009  . PAIN IN JOINT, ANKLE AND FOOT 01/12/2008  . Obstructive sleep apnea 08/29/2007  . GAIT DISTURBANCE 08/14/2007  . Depression 08/26/2006  . Essential hypertension 08/26/2006  . Primary osteoarthritis of both hands 08/26/2006  . Urinary incontinence 08/26/2006    Andrey Spearman, MS, OTR/L, [facility] 07/22/18 6:01 PM  [place] MAIN [facility] SERVICES [address] [place], [place], [address] Phone: [phone]   Fax:  [phone]  Name: Taylor Mccall MRN: [id number] Date of Birth: Jan 09, 1946

## 2018-07-24 ENCOUNTER — Ambulatory Visit: Payer: Medicare HMO | Admitting: Occupational Therapy

## 2018-07-24 ENCOUNTER — Other Ambulatory Visit: Payer: Self-pay

## 2018-07-24 DIAGNOSIS — I890 Lymphedema, not elsewhere classified: Secondary | ICD-10-CM | POA: Diagnosis not present

## 2018-07-24 NOTE — Therapy (Signed)
[place] MAIN [facility] SERVICES [address] [place], [place], [address] Phone: [phone]   Fax:  [phone]  Occupational Therapy Treatment  Patient Details  Name: Taylor Mccall MRN: [id number] Date of Birth: 01-12-1946 Referring Provider (OT): Taylor Peng, NP   Encounter Date: 07/24/2018  OT End of Session - 07/24/18 1732    Visit Number  8    Number of Visits  36    Date for OT Re-Evaluation  09/24/18    OT Start Time  0320    OT Stop Time  0415    OT Time Calculation (min)  55 min    Activity Tolerance  Patient tolerated treatment well;No increased pain    Behavior During Therapy  WFL for tasks assessed/performed       Past Medical History:  Diagnosis Date  . Anxiety   . Blood transfusion without reported diagnosis   . Breast mass, left   . Cataract   . Depression   . Gait disturbance   . GERD (gastroesophageal reflux disease)   . Hypertension   . Obesity   . Osteoarthritis   . Rheumatoid arthritis(714.0)   . Sleep apnea    cpap  . Urinary incontinence   . Venous insufficiency     Past Surgical History:  Procedure Laterality Date  . ABDOMINAL HYSTERECTOMY    . BLADDER SURGERY     sling  . BLADDER SUSPENSION  2009  . BREAST REDUCTION SURGERY    . COLONOSCOPY    . ESOPHAGOGASTRODUODENOSCOPY    . EYE SURGERY    . REPLACEMENT TOTAL KNEE Left   . TOE SURGERY Left    Left great toe  . TOTAL HIP ARTHROPLASTY    . TOTAL KNEE ARTHROPLASTY Right 12/09/2013   Procedure: Right Total Knee Arthroplasty;  Surgeon: Taylor Minion, MD;  Location: [place];  Service: Orthopedics;  Laterality: Right;  Marland Kitchen VESICOVAGINAL FISTULA CLOSURE W/ TAH      There were no vitals filed for this visit.  Subjective Assessment - 07/24/18 1731    Subjective   Taylor Mccall presents for OT visit 8/36 to address BLE LE. Pt presents without compression wraps in place on LLE.She uis accompanied by her daughter.    Patient is accompanied by:   Family member    Pertinent History  anxiety, depression gait disturbance, impaired transfers, HTN, OA, RA, OSA incontinence and CVI    Limitations  chronic leg pain and swelling, difficulty walking, decreased standing and walking tolwerance    Special Tests  positive Stemmer sign bilaterally, L>R    Pain Onset  More than a month ago                   OT Treatments/Exercises (OP) - 07/24/18 0001      ADLs   ADL Education Given  Yes      Manual Therapy   Manual Therapy  Edema management;Manual Lymphatic Drainage (MLD);Compression Bandaging    Manual Lymphatic Drainage (MLD)  MLD to LLE from proximal to distal utilizing short neck sequence bilaterally, deep abdominal breathing by patient, and functional inguinal pathways. No difficulty tolerating manual therapy observed.    Compression Bandaging  Gradient compression wraps x 3 over Rosidal foam from base of toes to below knee over cotton stockinette. Will modify PRN             OT Education - 07/24/18 1732    Education Details  Continued skilled Pt/caregiver education  And LE  ADL training throughout visit for lymphedema self care/ home program, including compression wrapping, compression garment and device wear/care, lymphatic pumping ther ex, simple self-MLD, and skin care. Discussed progress towards goals.    Person(s) Educated  Patient;Child(ren)    Methods  Explanation;Demonstration;Tactile cues;Verbal cues    Comprehension  Verbalized understanding;Returned demonstration;Verbal cues required;Tactile cues required;Need further instruction          OT Long Term Goals - 07/01/18 1847      OT LONG TERM GOAL #1   Title  Pt will be able to apply  knee length, multi-layer, short stretch compression wraps (one leg at a time) using correct gradient techniques with modified independence (extra time)  to achieve optimal limb volume reduction, and to return affected limb , as closely as possible, to premorbid size and shape.     Baseline  dependent    Time  4    Period  Days    Status  New    Target Date  --   4th OT Rx visit     OT LONG TERM GOAL #2   Title  Pt to achieve no less than 10% limb volume reductions below knees bilaterally during Intensive Phase CDT to improve AROM for ADLs, safe ambulation and transfers, to reduce infection risk, and to limit LE progression.    Baseline  dependent    Time  12    Period  Weeks    Status  New    Target Date  09/29/18      OT LONG TERM GOAL #3   Title  Pt will achieve  100% compliance with daily LE self-care program with min caregiver assistance throughout Intensive Phase CDT, including daily  skin care, lymphatic pumping therex, simple self-MLD and prescribe compression to ensure optimal limb volume reduction, to limit infection risk and to limit LE progression.    Baseline  dependent    Time  12    Period  Weeks    Status  New    Target Date  09/29/18      OT LONG TERM GOAL #4   Title  Pt will demonstrate modified independence using  assistive devices and extra time  during LE self-care training to don and doff properly fitting, custom compression garments/devices( on order from preferred DME vendor)for optimal LE self-management over time.     Baseline  dependent    Time  12    Period  Weeks    Status  New    Target Date  09/29/18            Plan - 07/24/18 1732    Clinical Impression Statement  Pt continues to demonstrate progress towards all OT goals for LE care. She expresses concern today ablout her appointment times   due to transportation challenges. Pt was able to change nexct appointment to a more agreeable time./ Pt toleratedmanual therapy without difficulty today. Compression wraps applied as established. Cont as per POC.    OT Occupational Profile and History  Comprehensive Assessment- Review of records and extensive additional review of physical, cognitive, psychosocial history related to current functional performance    Occupational  performance deficits (Please refer to evaluation for details):  ADL's;Work;Other;IADL's;Rest and Sleep;Leisure;Social Participation   functional ambulation and transfers, body image, role  performance   Body Structure / Function / Physical Skills  ADL;Decreased knowledge of precautions;Edema;Obesity;ROM;Skin integrity;Scar mobility;Mobility;IADL;Gait    Psychosocial Skills  Coping Strategies;Habits;Environmental  Adaptations    Rehab Potential  Good  Clinical Decision Making  Several treatment options, min-mod task modification necessary    Comorbidities Affecting Occupational Performance:  Presence of comorbidities impacting occupational performance    Comorbidities impacting occupational performance description:  see SUBJECTIVE for pertinent comorbidities    Modification or Assistance to Complete Evaluation   Min-Moderate modification of tasks or assist with assess necessary to complete eval    OT Frequency  3x / week    OT Duration  12 weeks    OT Treatment/Interventions  Self-care/ADL training;Therapeutic exercise;Energy conservation;Manual lymph drainage;Scar mobilization;Therapeutic activities;Patient/family education;Compression bandaging;Manual Therapy;DME and/or AE instruction    Plan  CCDT to include manual lymphatic drainage, skin care, comrpession wraps and custom garments once swelling is reduced, and therapeutic exercise for lymphatic pumping . Pt will be instructed in LE self care at all sessions in effort to limit LE progression and to facilitate retention of clinical gains over time.    Consulted and Agree with Plan of Care  Patient       Patient will benefit from skilled therapeutic intervention in order to improve the following deficits and impairments:   Body Structure / Function / Physical Skills: ADL, Decreased knowledge of precautions, Edema, Obesity, ROM, Skin integrity, Scar mobility, Mobility, IADL, Gait   Psychosocial Skills: Coping Strategies, Habits, Environmental   Adaptations   Visit Diagnosis: 1. Lymphedema, not elsewhere classified       Problem List Patient Active Problem List   Diagnosis Date Noted  . Routine general medical examination at a health care facility 12/11/2016  . Primary osteoarthritis of left hip 05/01/2016  . Autoimmune disease ([place]) 11/29/2015  . High risk medication use 11/29/2015  . Vitamin D deficiency 11/29/2015  . Macular degeneration 11/29/2015  . Bilateral lower extremity edema 10/26/2015  . H/O total knee replacement, bilateral 12/09/2013  . Breast mass, left 01/18/2011  . Obesity 12/08/2009  . Venous (peripheral) insufficiency 07/28/2009  . PAIN IN JOINT, ANKLE AND FOOT 01/12/2008  . Obstructive sleep apnea 08/29/2007  . GAIT DISTURBANCE 08/14/2007  . Depression 08/26/2006  . Essential hypertension 08/26/2006  . Primary osteoarthritis of both hands 08/26/2006  . Urinary incontinence 08/26/2006    Andrey Spearman, Taylor, OTR/L, [facility] 07/24/18 5:35 PM  [place] MAIN [facility] SERVICES [address] [place], [place], [address] Phone: [phone]   Fax:  [phone]  Name: Taylor Mccall MRN: [id number] Date of Birth: Mar 30, 1945

## 2018-07-28 ENCOUNTER — Ambulatory Visit: Payer: Medicare HMO | Admitting: Occupational Therapy

## 2018-07-29 ENCOUNTER — Ambulatory Visit: Payer: Medicare HMO | Admitting: Occupational Therapy

## 2018-07-29 ENCOUNTER — Telehealth: Payer: Self-pay | Admitting: Internal Medicine

## 2018-07-29 NOTE — Telephone Encounter (Signed)
LM on vmail to call back regarding Covid-19 questions   Covid-19 Screening Questions: Do you now or have you had a fever in the last 14 days?  Do you have any respiratory symptoms of shortness of breath or cough now or in the last 14 days?  Do you have any family members or close contacts with diagnosed or suspected Covid-19 in the past 14 days?  Have you been tested for Covid-19 and found to be positive?

## 2018-07-30 ENCOUNTER — Other Ambulatory Visit: Payer: Self-pay

## 2018-07-30 ENCOUNTER — Encounter: Payer: Self-pay | Admitting: Internal Medicine

## 2018-07-30 ENCOUNTER — Ambulatory Visit (AMBULATORY_SURGERY_CENTER): Payer: Medicare HMO | Admitting: Internal Medicine

## 2018-07-30 VITALS — BP 142/96 | HR 66 | Temp 99.0°F | Resp 18 | Ht 70.0 in | Wt 275.0 lb

## 2018-07-30 DIAGNOSIS — Z1211 Encounter for screening for malignant neoplasm of colon: Secondary | ICD-10-CM | POA: Diagnosis not present

## 2018-07-30 DIAGNOSIS — D124 Benign neoplasm of descending colon: Secondary | ICD-10-CM | POA: Diagnosis not present

## 2018-07-30 DIAGNOSIS — D123 Benign neoplasm of transverse colon: Secondary | ICD-10-CM | POA: Diagnosis not present

## 2018-07-30 MED ORDER — SODIUM CHLORIDE 0.9 % IV SOLN: 500.0000 mL | Freq: Once | INTRAVENOUS | Status: DC

## 2018-07-30 NOTE — Progress Notes (Signed)
Called to room to assist during endoscopic procedure.  Patient ID and intended procedure confirmed with present staff. Received instructions for my participation in the procedure from the performing physician.  

## 2018-07-30 NOTE — Progress Notes (Signed)
A/ox3, pleased with MAC, report to RN 

## 2018-07-30 NOTE — Progress Notes (Addendum)
[place]   Pt's states no medical or surgical changes since previsit or office visit.   Fall risk sign on the IV pole.    Pt has lymphedema.  Pt is seeing [facility].  Pt has a ace wrap with foam under nether ace wrap on Left lower leg.  Bandage is clean, dry and intact.

## 2018-07-30 NOTE — Patient Instructions (Addendum)
I found ands removed 2 tiny polyps. Not cancer - will prove that to you.  I do not think you will need another routine colonoscopy.  I appreciate the opportunity to care for you. Gatha Mayer, MD, FACG   YOU HAD AN ENDOSCOPIC PROCEDURE TODAY AT [facility]:   Refer to the procedure report that was given to you for any specific questions about what was found during the examination.  If the procedure report does not answer your questions, please call your gastroenterologist to clarify.  If you requested that your care partner not be given the details of your procedure findings, then the procedure report has been included in a sealed envelope for you to review at your convenience later.  YOU SHOULD EXPECT: Some feelings of bloating in the abdomen. Passage of more gas than usual.  Walking can help get rid of the air that was put into your GI tract during the procedure and reduce the bloating. If you had a lower endoscopy (such as a colonoscopy or flexible sigmoidoscopy) you may notice spotting of blood in your stool or on the toilet paper. If you underwent a bowel prep for your procedure, you may not have a normal bowel movement for a few days.  Please Note:  You might notice some irritation and congestion in your nose or some drainage.  This is from the oxygen used during your procedure.  There is no need for concern and it should clear up in a day or so.  SYMPTOMS TO REPORT IMMEDIATELY:   Following lower endoscopy (colonoscopy or flexible sigmoidoscopy):  Excessive amounts of blood in the stool  Significant tenderness or worsening of abdominal pains  Swelling of the abdomen that is new, acute  Fever of 100F or higher   For urgent or emergent issues, a gastroenterologist can be reached at any hour by calling [phone].   DIET:  We do recommend a small meal at first, but then you may proceed to your regular diet.  Drink plenty of fluids but you should avoid  alcoholic beverages for 24 hours.  ACTIVITY:  You should plan to take it easy for the rest of today and you should NOT DRIVE or use heavy machinery until tomorrow (because of the sedation medicines used during the test).    FOLLOW UP: Our staff will call the number listed on your records 48-72 hours following your procedure to check on you and address any questions or concerns that you may have regarding the information given to you following your procedure. If we do not reach you, we will leave a message.  We will attempt to reach you two times.  During this call, we will ask if you have developed any symptoms of COVID 19. If you develop any symptoms (ie: fever, flu-like symptoms, shortness of breath, cough etc.) before then, please call [phone].  If you test positive for Covid 19 in the 2 weeks post procedure, please call and report this information to [place].    If any biopsies were taken you will be contacted by phone or by letter within the next 1-3 weeks.  Please call us at [phone] if you have not heard about the biopsies in 3 weeks.    SIGNATURES/CONFIDENTIALITY: You and/or your care partner have signed paperwork which will be entered into your electronic medical record.  These signatures attest to the fact that that the information above on your After Visit Summary has been reviewed and is understood.  Full responsibility of the confidentiality of this discharge information lies with you and/or your care-partner.

## 2018-07-30 NOTE — Op Note (Signed)
[facility] Patient Name: Taylor Mccall Procedure Date: 07/30/2018 11:19 AM MRN: [id number] Endoscopist: Gatha Mayer , MD Age: 73 Referring MD:  Date of Birth: September 29, 1945 Gender: Female Account #: [id number] Procedure:                Colonoscopy Indications:              Screening for colorectal malignant neoplasm Medicines:                Propofol per Anesthesia, Monitored Anesthesia Care Procedure:                Pre-Anesthesia Assessment:                           - Prior to the procedure, a History and Physical                            was performed, and patient medications and                            allergies were reviewed. The patient's tolerance of                            previous anesthesia was also reviewed. The risks                            and benefits of the procedure and the sedation                            options and risks were discussed with the patient.                            All questions were answered, and informed consent                            was obtained. Prior Anticoagulants: The patient has                            taken no previous anticoagulant or antiplatelet                            agents. ASA Grade Assessment: II - A patient with                            mild systemic disease. After reviewing the risks                            and benefits, the patient was deemed in                            satisfactory condition to undergo the procedure.                           After obtaining informed consent, the colonoscope  was passed under direct vision. Throughout the                            procedure, the patient's blood pressure, pulse, and                            oxygen saturations were monitored continuously. The                            Colonoscope was introduced through the anus and                            advanced to the the cecum, identified by    appendiceal orifice and ileocecal valve. The                            colonoscopy was performed without difficulty. The                            patient tolerated the procedure well. The quality                            of the bowel preparation was good. The ileocecal                            valve, appendiceal orifice, and rectum were                            photographed. The bowel preparation used was                            Miralax via split dose instruction. Scope In: 11:40:45 AM Scope Out: 14:97:02 AM Scope Withdrawal Time: 0 hours 14 minutes 0 seconds  Total Procedure Duration: 0 hours 18 minutes 33 seconds  Findings:                 The perianal and digital rectal examinations were                            normal.                           Two sessile polyps were found in the descending                            colon and transverse colon. The polyps were                            diminutive in size. These polyps were removed with                            a cold snare. Resection and retrieval were                            complete. Verification of patient identification  for the specimen was done.                           Many small and large-mouthed diverticula were found                            in the sigmoid colon, descending colon and                            transverse colon.                           The exam was otherwise without abnormality on                            direct and retroflexion views. Complications:            No immediate complications. Estimated Blood Loss:     Estimated blood loss was minimal. Impression:               - Two diminutive polyps in the descending colon and                            in the transverse colon, removed with a cold snare.                            Resected and retrieved.                           - Diverticulosis in the sigmoid colon, in the                             descending colon and in the transverse colon.                           - The examination was otherwise normal on direct                            and retroflexion views. Recommendation:           - Patient has a contact number available for                            emergencies. The signs and symptoms of potential                            delayed complications were discussed with the                            patient. Return to normal activities tomorrow.                            Written discharge instructions were provided to the                            patient.                           -  Resume previous diet.                           - Continue present medications.                           - No recommendation at this time regarding repeat                            colonoscopy due to age. Gatha Mayer, MD 07/30/2018 12:10:04 PM This report has been signed electronically.

## 2018-07-31 ENCOUNTER — Ambulatory Visit: Payer: Medicare HMO | Admitting: Occupational Therapy

## 2018-07-31 DIAGNOSIS — I890 Lymphedema, not elsewhere classified: Secondary | ICD-10-CM

## 2018-07-31 NOTE — Therapy (Signed)
[place] MAIN [facility] SERVICES [address] [place], [place], [address] Phone: [phone]   Fax:  [phone]  Occupational Therapy Treatment  Patient Details  Name: Taylor Mccall MRN: [id number] Date of Birth: 17-Oct-1945 Referring Provider (OT): Dorothyann Peng, NP   Encounter Date: 07/31/2018  OT End of Session - 07/31/18 1625    Visit Number  9    Number of Visits  36    Date for OT Re-Evaluation  09/24/18    OT Start Time  0315    OT Stop Time  0425    OT Time Calculation (min)  70 min    Activity Tolerance  Patient tolerated treatment well;No increased pain    Behavior During Therapy  WFL for tasks assessed/performed       Past Medical History:  Diagnosis Date  . Anxiety   . Blood transfusion without reported diagnosis   . Breast mass, left   . Cataract   . Depression   . Gait disturbance   . GERD (gastroesophageal reflux disease)   . Hypertension   . Lymphedema of both lower extremities    Seeing OT at [place] to Left Lower leg  . Obesity   . Osteoarthritis   . Rheumatoid arthritis(714.0)   . Sleep apnea    cpap  . Urinary incontinence   . Venous insufficiency     Past Surgical History:  Procedure Laterality Date  . ABDOMINAL HYSTERECTOMY    . BLADDER SURGERY     sling  . BLADDER SUSPENSION  2009  . BREAST REDUCTION SURGERY    . COLONOSCOPY    . ESOPHAGOGASTRODUODENOSCOPY    . EYE SURGERY    . REPLACEMENT TOTAL KNEE Left   . TOE SURGERY Left    Left great toe  . TOTAL HIP ARTHROPLASTY    . TOTAL KNEE ARTHROPLASTY Right 12/09/2013   Procedure: Right Total Knee Arthroplasty;  Surgeon: Newt Minion, MD;  Location: [place];  Service: Orthopedics;  Laterality: Right;  Marland Kitchen VESICOVAGINAL FISTULA CLOSURE W/ TAH      There were no vitals filed for this visit.  Subjective Assessment - 07/31/18 1623    Subjective   Ms Brawley presents for OT visit 9/36 to address BLE LE. Pt presents  without compression wraps in place on LLE.She is accompanied by her daughter.. She has no new complaints.    Patient is accompanied by:  Family member    Pertinent History  anxiety, depression gait disturbance, impaired transfers, HTN, OA, RA, OSA incontinence and CVI    Limitations  chronic leg pain and swelling, difficulty walking, decreased standing and walking tolwerance    Special Tests  positive Stemmer sign bilaterally, L>R    Pain Onset  More than a month ago                   OT Treatments/Exercises (OP) - 07/31/18 0001      ADLs   ADL Education Given  Yes      Manual Therapy   Manual Therapy  Edema management    Manual Lymphatic Drainage (MLD)  MLD to LLE from proximal to distal utilizing short neck sequence bilaterally, deep abdominal breathing by patient, and functional inguinal pathways. No difficulty tolerating manual therapy observed.    Compression Bandaging  Gradient compression wraps x 3 over Rosidal foam from base of toes to below knee over cotton stockinette. Will modify PRN  OT Education - 07/31/18 1624    Education Details  Continued skilled Pt/caregiver education  And LE ADL training throughout visit for lymphedema self care/ home program, including compression wrapping, compression garment and device wear/care, lymphatic pumping ther ex, simple self-MLD, and skin care. Discussed progress towards goals.    Person(s) Educated  Patient;Child(ren)    Methods  Explanation;Demonstration;Tactile cues;Verbal cues    Comprehension  Verbalized understanding;Returned demonstration;Verbal cues required;Tactile cues required;Need further instruction          OT Long Term Goals - 07/01/18 1847      OT LONG TERM GOAL #1   Title  Pt will be able to apply  knee length, multi-layer, short stretch compression wraps (one leg at a time) using correct gradient techniques with modified independence (extra time)  to achieve optimal limb volume  reduction, and to return affected limb , as closely as possible, to premorbid size and shape.    Baseline  dependent    Time  4    Period  Days    Status  New    Target Date  --   4th OT Rx visit     OT LONG TERM GOAL #2   Title  Pt to achieve no less than 10% limb volume reductions below knees bilaterally during Intensive Phase CDT to improve AROM for ADLs, safe ambulation and transfers, to reduce infection risk, and to limit LE progression.    Baseline  dependent    Time  12    Period  Weeks    Status  New    Target Date  09/29/18      OT LONG TERM GOAL #3   Title  Pt will achieve  100% compliance with daily LE self-care program with min caregiver assistance throughout Intensive Phase CDT, including daily  skin care, lymphatic pumping therex, simple self-MLD and prescribe compression to ensure optimal limb volume reduction, to limit infection risk and to limit LE progression.    Baseline  dependent    Time  12    Period  Weeks    Status  New    Target Date  09/29/18      OT LONG TERM GOAL #4   Title  Pt will demonstrate modified independence using  assistive devices and extra time  during LE self-care training to don and doff properly fitting, custom compression garments/devices( on order from preferred DME vendor)for optimal LE self-management over time.     Baseline  dependent    Time  12    Period  Weeks    Status  New    Target Date  09/29/18            Plan - 07/31/18 1625    Clinical Impression Statement  Provided MLD and compression wraps to LLE as established. Pt tolerated well without increased pain or complaint. Limb volume slowly decreasing . Fatty fibrosis at distal leg and ankle remains syubborn and resistant to manual therapy thus far. Cont as per POC. Volumetrics next visit.    OT Occupational Profile and History  Comprehensive Assessment- Review of records and extensive additional review of physical, cognitive, psychosocial history related to current  functional performance    Occupational performance deficits (Please refer to evaluation for details):  ADL's;Work;Other;IADL's;Rest and Sleep;Leisure;Social Participation   functional ambulation and transfers, body image, role  performance   Body Structure / Function / Physical Skills  ADL;Decreased knowledge of precautions;Edema;Obesity;ROM;Skin integrity;Scar mobility;Mobility;IADL;Gait    Psychosocial Skills  Coping Strategies;Habits;Environmental  Adaptations  Rehab Potential  Good    Clinical Decision Making  Several treatment options, min-mod task modification necessary    Comorbidities Affecting Occupational Performance:  Presence of comorbidities impacting occupational performance    Comorbidities impacting occupational performance description:  see SUBJECTIVE for pertinent comorbidities    Modification or Assistance to Complete Evaluation   Min-Moderate modification of tasks or assist with assess necessary to complete eval    OT Frequency  3x / week    OT Duration  12 weeks    OT Treatment/Interventions  Self-care/ADL training;Therapeutic exercise;Energy conservation;Manual lymph drainage;Scar mobilization;Therapeutic activities;Patient/family education;Compression bandaging;Manual Therapy;DME and/or AE instruction    Plan  CCDT to include manual lymphatic drainage, skin care, comrpession wraps and custom garments once swelling is reduced, and therapeutic exercise for lymphatic pumping . Pt will be instructed in LE self care at all sessions in effort to limit LE progression and to facilitate retention of clinical gains over time.    Consulted and Agree with Plan of Care  Patient       Patient will benefit from skilled therapeutic intervention in order to improve the following deficits and impairments:   Body Structure / Function / Physical Skills: ADL, Decreased knowledge of precautions, Edema, Obesity, ROM, Skin integrity, Scar mobility, Mobility, IADL, Gait   Psychosocial Skills:  Coping Strategies, Habits, Environmental  Adaptations   Visit Diagnosis: 1. Lymphedema, not elsewhere classified       Problem List Patient Active Problem List   Diagnosis Date Noted  . Routine general medical examination at a health care facility 12/11/2016  . Primary osteoarthritis of left hip 05/01/2016  . Autoimmune disease ([place]) 11/29/2015  . High risk medication use 11/29/2015  . Vitamin D deficiency 11/29/2015  . Macular degeneration 11/29/2015  . Bilateral lower extremity edema 10/26/2015  . H/O total knee replacement, bilateral 12/09/2013  . Breast mass, left 01/18/2011  . Obesity 12/08/2009  . Venous (peripheral) insufficiency 07/28/2009  . PAIN IN JOINT, ANKLE AND FOOT 01/12/2008  . Obstructive sleep apnea 08/29/2007  . GAIT DISTURBANCE 08/14/2007  . Depression 08/26/2006  . Essential hypertension 08/26/2006  . Primary osteoarthritis of both hands 08/26/2006  . Urinary incontinence 08/26/2006    Andrey Spearman, MS, OTR/L, [facility] 07/31/18 4:28 PM  [place] MAIN [facility] SERVICES [address] [place], [place], [address] Phone: [phone]   Fax:  [phone]  Name: DALLAS SCORSONE MRN: [id number] Date of Birth: 23-Mar-1945

## 2018-08-01 ENCOUNTER — Telehealth: Payer: Self-pay | Admitting: *Deleted

## 2018-08-01 NOTE — Telephone Encounter (Signed)
Second attempt, left VM.  

## 2018-08-05 ENCOUNTER — Ambulatory Visit: Payer: Medicare HMO | Admitting: Occupational Therapy

## 2018-08-05 ENCOUNTER — Encounter: Payer: Self-pay | Admitting: Internal Medicine

## 2018-08-05 ENCOUNTER — Other Ambulatory Visit: Payer: Self-pay

## 2018-08-05 DIAGNOSIS — I890 Lymphedema, not elsewhere classified: Secondary | ICD-10-CM | POA: Diagnosis not present

## 2018-08-05 NOTE — Therapy (Signed)
[place] MAIN [facility] SERVICES [address] [place], [place], [address] Phone: [phone]   Fax:  [phone]  Occupational Therapy Treatment Note and Progress Report  Patient Details  Name: Taylor Mccall MRN: [id number] Date of Birth: 12-15-1945 Referring Provider (OT): Dorothyann Peng, NP   Encounter Date: 08/05/2018  OT End of Session - 08/05/18 1533    Visit Number  10    Number of Visits  36    Date for OT Re-Evaluation  09/24/18    OT Start Time  0325    OT Stop Time  0415    OT Time Calculation (min)  50 min    Activity Tolerance  Patient tolerated treatment well;No increased pain    Behavior During Therapy  WFL for tasks assessed/performed       Past Medical History:  Diagnosis Date  . Anxiety   . Blood transfusion without reported diagnosis   . Breast mass, left   . Cataract   . Depression   . Gait disturbance   . GERD (gastroesophageal reflux disease)   . Hypertension   . Lymphedema of both lower extremities    Seeing OT at [place] to Left Lower leg  . Obesity   . Osteoarthritis   . Rheumatoid arthritis(714.0)   . Sleep apnea    cpap  . Urinary incontinence   . Venous insufficiency     Past Surgical History:  Procedure Laterality Date  . ABDOMINAL HYSTERECTOMY    . BLADDER SURGERY     sling  . BLADDER SUSPENSION  2009  . BREAST REDUCTION SURGERY    . COLONOSCOPY    . ESOPHAGOGASTRODUODENOSCOPY    . EYE SURGERY    . REPLACEMENT TOTAL KNEE Left   . TOE SURGERY Left    Left great toe  . TOTAL HIP ARTHROPLASTY    . TOTAL KNEE ARTHROPLASTY Right 12/09/2013   Procedure: Right Total Knee Arthroplasty;  Surgeon: Newt Minion, MD;  Location: [place];  Service: Orthopedics;  Laterality: Right;  Marland Kitchen VESICOVAGINAL FISTULA CLOSURE W/ TAH      There were no vitals filed for this visit.  Subjective Assessment - 08/05/18 1819    Subjective   Ms Bari presents for OT visit 10/36 to  address BLE LE. Pt presents with compression wraps in place on LLE.She is accompanied by her daughter.Pt is unaccompanied today and denies pain in RLE. Reviewed all goals throughout session.    Patient is accompanied by:  Family member    Pertinent History  anxiety, depression gait disturbance, impaired transfers, HTN, OA, RA, OSA incontinence and CVI    Limitations  chronic leg pain and swelling, difficulty walking, decreased standing and walking tolwerance    Special Tests  positive Stemmer sign bilaterally, L>R    Pain Onset  More than a month ago          LYMPHEDEMA/ONCOLOGY QUESTIONNAIRE - 08/05/18 1549      Left Lower Extremity Lymphedema   Other  LLE (Rx) limb volume measures 5695.78 ml.    Other  LLE limb volume below the knee is decreased by 8.7%, which nearly meets 10% volume reduction goal. Pt achieved this reduction since initially measured on first day of treatment.              OT Treatments/Exercises (OP) - 08/05/18 0001      ADLs   ADL Education Given  Yes      Manual Therapy  Manual Therapy  Edema management;Manual Lymphatic Drainage (MLD)    Manual Lymphatic Drainage (MLD)  MLD to LLE from proximal to distal utilizing short neck sequence bilaterally, deep abdominal breathing by patient, and functional inguinal pathways. No difficulty tolerating manual therapy observed.    Compression Bandaging  Gradient compression wraps x 3 over Rosidal foam from base of toes to below knee over cotton stockinette. Will modify PRN             OT Education - 08/05/18 1822    Education Details  Continued skilled Pt/caregiver education  And LE ADL training throughout visit for lymphedema self care/ home program, including compression wrapping, compression garment and device wear/care, lymphatic pumping ther ex, simple self-MLD, and skin care. Discussed progress towards goals.    Person(s) Educated  Patient;Child(ren)    Methods  Explanation;Demonstration;Tactile  cues;Verbal cues    Comprehension  Verbalized understanding;Returned demonstration;Verbal cues required;Tactile cues required;Need further instruction          OT Long Term Goals - 08/05/18 1824      OT LONG TERM GOAL #1   Title  Pt will be able to apply  knee length, multi-layer, short stretch compression wraps (one leg at a time) using correct gradient techniques with modified independence (extra time)  to achieve optimal limb volume reduction, and to return affected limb , as closely as possible, to premorbid size and shape.    Baseline  dependent 08/05/18 : Achieved on time    Time  4    Period  Days    Status  Achieved      OT LONG TERM GOAL #2   Title  Pt to achieve no less than 10% limb volume reductions below knees bilaterally during Intensive Phase CDT to improve AROM for ADLs, safe ambulation and transfers, to reduce infection risk, and to limit LE progression.    Baseline  dependent 08/05/18: Excellent  LLE progress with 8% volume reduction measured below the knee today.    Time  12    Period  Weeks    Status  Partially Met      OT LONG TERM GOAL #3   Title  Pt will achieve  100% compliance with daily LE self-care program with min caregiver assistance throughout Intensive Phase CDT, including daily  skin care, lymphatic pumping therex, simple self-MLD and prescribe compression to ensure optimal limb volume reduction, to limit infection risk and to limit LE progression.    Baseline  dependent 08/05/18 : Achieved on time    Time  12    Period  Weeks    Status  Achieved      OT LONG TERM GOAL #4   Title  Pt will demonstrate modified independence using  assistive devices and extra time  during LE self-care training to don and doff properly fitting, custom compression garments/devices( on order from preferred DME vendor)for optimal LE self-management over time.     Baseline  dependent    Time  12    Period  Weeks    Status  On-going            Plan - 08/05/18 1825     Clinical Impression Statement  Pt demonstrates excellent progress towards all OT goals for LE self care to date. See Long Term Goals for details. Comparative limb volumetrics to LLE today reveal 8% limb volume reduction  below the knee since commencing OT for CDT. Pt is pleased with progress. Family support is ongoing. Cont as per POC.  Demonstrate garment fabric samples and discuss recommended specifications next session.    OT Occupational Profile and History  Comprehensive Assessment- Review of records and extensive additional review of physical, cognitive, psychosocial history related to current functional performance    Occupational performance deficits (Please refer to evaluation for details):  ADL's;Work;Other;IADL's;Rest and Sleep;Leisure;Social Participation   functional ambulation and transfers, body image, role  performance   Body Structure / Function / Physical Skills  ADL;Decreased knowledge of precautions;Edema;Obesity;ROM;Skin integrity;Scar mobility;Mobility;IADL;Gait    Psychosocial Skills  Coping Strategies;Habits;Environmental  Adaptations    Rehab Potential  Good    Clinical Decision Making  Several treatment options, min-mod task modification necessary    Comorbidities Affecting Occupational Performance:  Presence of comorbidities impacting occupational performance    Comorbidities impacting occupational performance description:  see SUBJECTIVE for pertinent comorbidities    Modification or Assistance to Complete Evaluation   Min-Moderate modification of tasks or assist with assess necessary to complete eval    OT Frequency  3x / week    OT Duration  12 weeks    OT Treatment/Interventions  Self-care/ADL training;Therapeutic exercise;Energy conservation;Manual lymph drainage;Scar mobilization;Therapeutic activities;Patient/family education;Compression bandaging;Manual Therapy;DME and/or AE instruction    Plan  CCDT to include manual lymphatic drainage, skin care, comrpession wraps  and custom garments once swelling is reduced, and therapeutic exercise for lymphatic pumping . Pt will be instructed in LE self care at all sessions in effort to limit LE progression and to facilitate retention of clinical gains over time.    Consulted and Agree with Plan of Care  Patient       Patient will benefit from skilled therapeutic intervention in order to improve the following deficits and impairments:   Body Structure / Function / Physical Skills: ADL, Decreased knowledge of precautions, Edema, Obesity, ROM, Skin integrity, Scar mobility, Mobility, IADL, Gait   Psychosocial Skills: Coping Strategies, Habits, Environmental  Adaptations   Visit Diagnosis: 1. Lymphedema, not elsewhere classified       Problem List Patient Active Problem List   Diagnosis Date Noted  . Routine general medical examination at a health care facility 12/11/2016  . Primary osteoarthritis of left hip 05/01/2016  . Autoimmune disease ([place]) 11/29/2015  . High risk medication use 11/29/2015  . Vitamin D deficiency 11/29/2015  . Macular degeneration 11/29/2015  . Bilateral lower extremity edema 10/26/2015  . H/O total knee replacement, bilateral 12/09/2013  . Breast mass, left 01/18/2011  . Obesity 12/08/2009  . Venous (peripheral) insufficiency 07/28/2009  . PAIN IN JOINT, ANKLE AND FOOT 01/12/2008  . Obstructive sleep apnea 08/29/2007  . GAIT DISTURBANCE 08/14/2007  . Depression 08/26/2006  . Essential hypertension 08/26/2006  . Primary osteoarthritis of both hands 08/26/2006  . Urinary incontinence 08/26/2006    Andrey Spearman, MS, OTR/L, [facility] 08/05/18 6:31 PM  [place] MAIN [facility] SERVICES [address] [place], [place], [address] Phone: [phone]   Fax:  [phone]  Name: Taylor Mccall MRN: [id number] Date of Birth: Aug 07, 1945

## 2018-08-05 NOTE — Therapy (Deleted)
[facility] MAIN REHAB SERVICES [address] [place], [place], [address] Phone: [phone]   Fax:  [phone]  Occupational Therapy Treatment  Patient Details  Name: Taylor Mccall MRN: [id number] Date of Birth: 06/30/1945 Referring Provider (OT): Cory Nafziger, NP   Encounter Date: 08/05/2018  OT End of Session - 08/05/18 1533    Visit Number  10    Number of Visits  36    Date for OT Re-Evaluation  09/24/18    OT Start Time  0325    OT Stop Time  0415    OT Time Calculation (min)  50 min    Activity Tolerance  Patient tolerated treatment well;No increased pain    Behavior During Therapy  WFL for tasks assessed/performed       Past Medical History:  Diagnosis Date  . Anxiety   . Blood transfusion without reported diagnosis   . Breast mass, left   . Cataract   . Depression   . Gait disturbance   . GERD (gastroesophageal reflux disease)   . Hypertension   . Lymphedema of both lower extremities    Seeing OT at [facility] - dreeeing to Left Lower leg  . Obesity   . Osteoarthritis   . Rheumatoid arthritis(714.0)   . Sleep apnea    cpap  . Urinary incontinence   . Venous insufficiency     Past Surgical History:  Procedure Laterality Date  . ABDOMINAL HYSTERECTOMY    . BLADDER SURGERY     sling  . BLADDER SUSPENSION  2009  . BREAST REDUCTION SURGERY    . COLONOSCOPY    . ESOPHAGOGASTRODUODENOSCOPY    . EYE SURGERY    . REPLACEMENT TOTAL KNEE Left   . TOE SURGERY Left    Left great toe  . TOTAL HIP ARTHROPLASTY    . TOTAL KNEE ARTHROPLASTY Right 12/09/2013   Procedure: Right Total Knee Arthroplasty;  Surgeon: Marcus Duda V, MD;  Location: [facility] OR;  Service: Orthopedics;  Laterality: Right;  . VESICOVAGINAL FISTULA CLOSURE W/ TAH      There were no vitals filed for this visit.  Subjective Assessment - 08/05/18 1819    Subjective   Taylor Mccall presents for OT visit 10/36 to address BLE LE. Pt presents  with compression wraps in place on LLE.She is accompanied by her daughter.Pt is unaccompanied today and denies pain in RLE. Reviewed all goals throughout session.    Patient is accompanied by:  Family member    Pertinent History  anxiety, depression gait disturbance, impaired transfers, HTN, OA, RA, OSA incontinence and CVI    Limitations  chronic leg pain and swelling, difficulty walking, decreased standing and walking tolwerance    Special Tests  positive Stemmer sign bilaterally, L>R    Pain Onset  More than a month ago          LYMPHEDEMA/ONCOLOGY QUESTIONNAIRE - 08/05/18 1549      Left Lower Extremity Lymphedema   Other  LLE (Rx) limb volume measures 5695.78 ml.    Other  LLE limb volume below the knee is decreased by 8.7%, which nearly meets 10% volume reduction goal. Pt achieved this reduction since initially measured on first day of treatment.              OT Treatments/Exercises (OP) - 08/05/18 0001      ADLs   ADL Education Given  Yes      Manual Therapy   Manual Therapy    Edema management;Manual Lymphatic Drainage (MLD)    Manual Lymphatic Drainage (MLD)  MLD to LLE from proximal to distal utilizing short neck sequence bilaterally, deep abdominal breathing by patient, and functional inguinal pathways. No difficulty tolerating manual therapy observed.    Compression Bandaging  Gradient compression wraps x 3 over Rosidal foam from base of toes to below knee over cotton stockinette. Will modify PRN             OT Education - 08/05/18 1822    Education Details  Continued skilled Pt/caregiver education  And LE ADL training throughout visit for lymphedema self care/ home program, including compression wrapping, compression garment and device wear/care, lymphatic pumping ther ex, simple self-MLD, and skin care. Discussed progress towards goals.    Person(s) Educated  Patient;Child(ren)    Methods  Explanation;Demonstration;Tactile cues;Verbal cues    Comprehension   Verbalized understanding;Returned demonstration;Verbal cues required;Tactile cues required;Need further instruction          OT Long Term Goals - 08/05/18 1824      OT LONG TERM GOAL #1   Title  Pt will be able to apply  knee length, multi-layer, short stretch compression wraps (one leg at a time) using correct gradient techniques with modified independence (extra time)  to achieve optimal limb volume reduction, and to return affected limb , as closely as possible, to premorbid size and shape.    Baseline  dependent 08/05/18 : Achieved on time    Time  4    Period  Days    Status  Achieved      OT LONG TERM GOAL #2   Title  Pt to achieve no less than 10% limb volume reductions below knees bilaterally during Intensive Phase CDT to improve AROM for ADLs, safe ambulation and transfers, to reduce infection risk, and to limit LE progression.    Baseline  dependent 08/05/18: Excellent  LLE progress with 8% volume reduction measured below the knee today.    Time  12    Period  Weeks    Status  Partially Met      OT LONG TERM GOAL #3   Title  Pt will achieve  100% compliance with daily LE self-care program with min caregiver assistance throughout Intensive Phase CDT, including daily  skin care, lymphatic pumping therex, simple self-MLD and prescribe compression to ensure optimal limb volume reduction, to limit infection risk and to limit LE progression.    Baseline  dependent 08/05/18 : Achieved on time    Time  12    Period  Weeks    Status  Achieved      OT LONG TERM GOAL #4   Title  Pt will demonstrate modified independence using  assistive devices and extra time  during LE self-care training to don and doff properly fitting, custom compression garments/devices( on order from preferred DME vendor)for optimal LE self-management over time.     Baseline  dependent    Time  12    Period  Weeks    Status  On-going            Plan - 08/05/18 1825    Clinical Impression Statement  Pt  demonstrates excellent progress towards all OT goals for LE self care to date. See Long Term Goals for details. Comparative limb volumetrics to LLE today reveal 8% limb volume reduction  below the knee since commencing OT for CDT. Pt is pleased with progress. Family support is ongoing. Cont as per POC. Demonstrate garment fabric   samples and discuss recommended specifications next session.    OT Occupational Profile and History  Comprehensive Assessment- Review of records and extensive additional review of physical, cognitive, psychosocial history related to current functional performance    Occupational performance deficits (Please refer to evaluation for details):  ADL's;Work;Other;IADL's;Rest and Sleep;Leisure;Social Participation   functional ambulation and transfers, body image, role  performance   Body Structure / Function / Physical Skills  ADL;Decreased knowledge of precautions;Edema;Obesity;ROM;Skin integrity;Scar mobility;Mobility;IADL;Gait    Psychosocial Skills  Coping Strategies;Habits;Environmental  Adaptations    Rehab Potential  Good    Clinical Decision Making  Several treatment options, min-mod task modification necessary    Comorbidities Affecting Occupational Performance:  Presence of comorbidities impacting occupational performance    Comorbidities impacting occupational performance description:  see SUBJECTIVE for pertinent comorbidities    Modification or Assistance to Complete Evaluation   Min-Moderate modification of tasks or assist with assess necessary to complete eval    OT Frequency  3x / week    OT Duration  12 weeks    OT Treatment/Interventions  Self-care/ADL training;Therapeutic exercise;Energy conservation;Manual lymph drainage;Scar mobilization;Therapeutic activities;Patient/family education;Compression bandaging;Manual Therapy;DME and/or AE instruction    Plan  CCDT to include manual lymphatic drainage, skin care, comrpession wraps and custom garments once swelling is  reduced, and therapeutic exercise for lymphatic pumping . Pt will be instructed in LE self care at all sessions in effort to limit LE progression and to facilitate retention of clinical gains over time.    Consulted and Agree with Plan of Care  Patient       Patient will benefit from skilled therapeutic intervention in order to improve the following deficits and impairments:   Body Structure / Function / Physical Skills: ADL, Decreased knowledge of precautions, Edema, Obesity, ROM, Skin integrity, Scar mobility, Mobility, IADL, Gait   Psychosocial Skills: Coping Strategies, Habits, Environmental  Adaptations   Visit Diagnosis: 1. Lymphedema, not elsewhere classified       Problem List Patient Active Problem List   Diagnosis Date Noted  . Routine general medical examination at a health care facility 12/11/2016  . Primary osteoarthritis of left hip 05/01/2016  . Autoimmune disease (HCC) 11/29/2015  . High risk medication use 11/29/2015  . Vitamin D deficiency 11/29/2015  . Macular degeneration 11/29/2015  . Bilateral lower extremity edema 10/26/2015  . H/O total knee replacement, bilateral 12/09/2013  . Breast mass, left 01/18/2011  . Obesity 12/08/2009  . Venous (peripheral) insufficiency 07/28/2009  . PAIN IN JOINT, ANKLE AND FOOT 01/12/2008  . Obstructive sleep apnea 08/29/2007  . GAIT DISTURBANCE 08/14/2007  . Depression 08/26/2006  . Essential hypertension 08/26/2006  . Primary osteoarthritis of both hands 08/26/2006  . Urinary incontinence 08/26/2006     L  08/05/2018, 6:29 PM  [facility] MA[facility] REH[facility] SERVICES [address] [place], [place], [address] Phone: [phone]   Fax:  [phone]  Name: Khristy A Krenn MRN: [id number] Date of Birth: 09/25/1945 

## 2018-08-05 NOTE — Progress Notes (Signed)
2 diminutive adenomas  No recall age My Chart

## 2018-08-07 ENCOUNTER — Ambulatory Visit: Payer: Medicare HMO | Attending: Adult Health | Admitting: Occupational Therapy

## 2018-08-07 ENCOUNTER — Other Ambulatory Visit: Payer: Self-pay

## 2018-08-07 DIAGNOSIS — I890 Lymphedema, not elsewhere classified: Secondary | ICD-10-CM | POA: Insufficient documentation

## 2018-08-07 NOTE — Therapy (Signed)
[place] MAIN [facility] SERVICES [address] [place], [place], [address] Phone: [phone]   Fax:  [phone]  Occupational Therapy Treatment  Patient Details  Name: Taylor Mccall MRN: [id number] Date of Birth: [birth year] Referring Provider (OT): Dorothyann Peng, NP   Encounter Date: 08/07/2018  OT End of Session - 08/07/18 1345    Visit Number  11    Number of Visits  36    Date for OT Re-Evaluation  09/24/18    OT Start Time  0200    Activity Tolerance  Patient tolerated treatment well;No increased pain    Behavior During Therapy  WFL for tasks assessed/performed       Past Medical History:  Diagnosis Date  . Anxiety   . Blood transfusion without reported diagnosis   . Breast mass, left   . Cataract   . Depression   . Gait disturbance   . GERD (gastroesophageal reflux disease)   . Hypertension   . Lymphedema of both lower extremities    Seeing OT at [place] to Left Lower leg  . Obesity   . Osteoarthritis   . Rheumatoid arthritis(714.0)   . Sleep apnea    cpap  . Urinary incontinence   . Venous insufficiency     Past Surgical History:  Procedure Laterality Date  . ABDOMINAL HYSTERECTOMY    . BLADDER SURGERY     sling  . BLADDER SUSPENSION  2009  . BREAST REDUCTION SURGERY    . COLONOSCOPY    . ESOPHAGOGASTRODUODENOSCOPY    . EYE SURGERY    . REPLACEMENT TOTAL KNEE Left   . TOE SURGERY Left    Left great toe  . TOTAL HIP ARTHROPLASTY    . TOTAL KNEE ARTHROPLASTY Right 12/09/2013   Procedure: Right Total Knee Arthroplasty;  Surgeon: Newt Minion, MD;  Location: [facility];  Service: Orthopedics;  Laterality: Right;  Marland Kitchen VESICOVAGINAL FISTULA CLOSURE W/ TAH      There were no vitals filed for this visit.  Subjective Assessment - 08/07/18 1343    Subjective   Taylor Mccall presents for OT visit 11/36 to address BLE LE. Pt presents with compression wraps in place. She has no new complaints     today.    Patient is accompanied by:  Family member    Pertinent History  anxiety, depression gait disturbance, impaired transfers, HTN, OA, RA, OSA incontinence and CVI    Limitations  chronic leg pain and swelling, difficulty walking, decreased standing and walking tolwerance    Special Tests  positive Stemmer sign bilaterally, L>R    Pain Onset  More than a month ago                   OT Treatments/Exercises (OP) - 08/07/18 0001      ADLs   ADL Education Given  Yes      Manual Therapy   Manual Therapy  Edema management;Manual Lymphatic Drainage (MLD)    Manual Lymphatic Drainage (MLD)  MLD to LLE from proximal to distal utilizing short neck sequence bilaterally, deep abdominal breathing by patient, and functional inguinal pathways. No difficulty tolerating manual therapy observed.    Compression Bandaging  Gradient compression wraps x 3 over Rosidal foam from base of toes to below knee over cotton stockinette. Will modify PRN             OT Education - 08/07/18 1345    Education Details  Continued skilled  Pt/caregiver education  And LE ADL training throughout visit for lymphedema self care/ home program, including compression wrapping, compression garment and device wear/care, lymphatic pumping ther ex, simple self-MLD, and skin care. Discussed progress towards goals.    Person(s) Educated  Patient;Child(ren)    Methods  Explanation;Demonstration;Tactile cues;Verbal cues    Comprehension  Verbalized understanding;Returned demonstration;Verbal cues required;Tactile cues required;Need further instruction          OT Long Term Goals - 08/05/18 1824      OT LONG TERM GOAL #1   Title  Pt will be able to apply  knee length, multi-layer, short stretch compression wraps (one leg at a time) using correct gradient techniques with modified independence (extra time)  to achieve optimal limb volume reduction, and to return affected limb , as closely as possible, to  premorbid size and shape.    Baseline  dependent 08/05/18 : Achieved on time    Time  4    Period  Days    Status  Achieved      OT LONG TERM GOAL #2   Title  Pt to achieve no less than 10% limb volume reductions below knees bilaterally during Intensive Phase CDT to improve AROM for ADLs, safe ambulation and transfers, to reduce infection risk, and to limit LE progression.    Baseline  dependent 08/05/18: Excellent  LLE progress with 8% volume reduction measured below the knee today.    Time  12    Period  Weeks    Status  Partially Met      OT LONG TERM GOAL #3   Title  Pt will achieve  100% compliance with daily LE self-care program with min caregiver assistance throughout Intensive Phase CDT, including daily  skin care, lymphatic pumping therex, simple self-MLD and prescribe compression to ensure optimal limb volume reduction, to limit infection risk and to limit LE progression.    Baseline  dependent 08/05/18 : Achieved on time    Time  12    Period  Weeks    Status  Achieved      OT LONG TERM GOAL #4   Title  Pt will demonstrate modified independence using  assistive devices and extra time  during LE self-care training to don and doff properly fitting, custom compression garments/devices( on order from preferred DME vendor)for optimal LE self-management over time.     Baseline  dependent    Time  12    Period  Weeks    Status  On-going            Plan - 08/07/18 1345    Clinical Impression Statement  Pt tolerated MLD, skin care and compression wraps to LLE without difficulty today. Collaborated on plan to complete measurements for custom LLE compression garments and devices next week.    OT Occupational Profile and History  Comprehensive Assessment- Review of records and extensive additional review of physical, cognitive, psychosocial history related to current functional performance    Occupational performance deficits (Please refer to evaluation for details):   ADL's;Work;Other;IADL's;Rest and Sleep;Leisure;Social Participation   functional ambulation and transfers, body image, role  performance   Body Structure / Function / Physical Skills  ADL;Decreased knowledge of precautions;Edema;Obesity;ROM;Skin integrity;Scar mobility;Mobility;IADL;Gait    Psychosocial Skills  Coping Strategies;Habits;Environmental  Adaptations    Rehab Potential  Good    Clinical Decision Making  Several treatment options, min-mod task modification necessary    Comorbidities Affecting Occupational Performance:  Presence of comorbidities impacting occupational performance  Comorbidities impacting occupational performance description:  see SUBJECTIVE for pertinent comorbidities    Modification or Assistance to Complete Evaluation   Min-Moderate modification of tasks or assist with assess necessary to complete eval    OT Frequency  3x / week    OT Duration  12 weeks    OT Treatment/Interventions  Self-care/ADL training;Therapeutic exercise;Energy conservation;Manual lymph drainage;Scar mobilization;Therapeutic activities;Patient/family education;Compression bandaging;Manual Therapy;DME and/or AE instruction    Plan  CCDT to include manual lymphatic drainage, skin care, comrpession wraps and custom garments once swelling is reduced, and therapeutic exercise for lymphatic pumping . Pt will be instructed in LE self care at all sessions in effort to limit LE progression and to facilitate retention of clinical gains over time.    Consulted and Agree with Plan of Care  Patient       Patient will benefit from skilled therapeutic intervention in order to improve the following deficits and impairments:   Body Structure / Function / Physical Skills: ADL, Decreased knowledge of precautions, Edema, Obesity, ROM, Skin integrity, Scar mobility, Mobility, IADL, Gait   Psychosocial Skills: Coping Strategies, Habits, Environmental  Adaptations   Visit Diagnosis: 1. Lymphedema, not elsewhere  classified       Problem List Patient Active Problem List   Diagnosis Date Noted  . Routine general medical examination at a health care facility 12/11/2016  . Primary osteoarthritis of left hip 05/01/2016  . Autoimmune disease ([place]) 11/29/2015  . High risk medication use 11/29/2015  . Vitamin D deficiency 11/29/2015  . Macular degeneration 11/29/2015  . Bilateral lower extremity edema 10/26/2015  . H/O total knee replacement, bilateral 12/09/2013  . Breast mass, left 01/18/2011  . Obesity 12/08/2009  . Venous (peripheral) insufficiency 07/28/2009  . PAIN IN JOINT, ANKLE AND FOOT 01/12/2008  . Obstructive sleep apnea 08/29/2007  . GAIT DISTURBANCE 08/14/2007  . Depression 08/26/2006  . Essential hypertension 08/26/2006  . Primary osteoarthritis of both hands 08/26/2006  . Urinary incontinence 08/26/2006    Andrey Spearman, Taylor, OTR/L, [facility] 08/07/18 3:16 PM   [place] MAIN [facility] SERVICES [address] [place], [place], [address] Phone: [phone]   Fax:  [phone]  Name: Taylor Mccall MRN: [id number] Date of Birth: 12/30/1945

## 2018-08-12 ENCOUNTER — Other Ambulatory Visit: Payer: Self-pay

## 2018-08-12 ENCOUNTER — Ambulatory Visit: Payer: Medicare HMO | Admitting: Occupational Therapy

## 2018-08-12 DIAGNOSIS — I890 Lymphedema, not elsewhere classified: Secondary | ICD-10-CM | POA: Diagnosis not present

## 2018-08-12 NOTE — Therapy (Signed)
[place] MAIN [facility] SERVICES [address] [place], [place], [address] Phone: [phone]   Fax:  [phone]  Occupational Therapy Treatment  Patient Details  Name: Taylor Mccall MRN: [id number] Date of Birth: [birth year] Referring Provider (OT): Dorothyann Peng, NP   Encounter Date: 08/12/2018  OT End of Session - 08/12/18 1530    Visit Number  12    Number of Visits  36    Date for OT Re-Evaluation  09/24/18    OT Start Time  0320    OT Stop Time  0420    OT Time Calculation (min)  60 min    Activity Tolerance  Patient tolerated treatment well;No increased pain    Behavior During Therapy  WFL for tasks assessed/performed       Past Medical History:  Diagnosis Date  . Anxiety   . Blood transfusion without reported diagnosis   . Breast mass, left   . Cataract   . Depression   . Gait disturbance   . GERD (gastroesophageal reflux disease)   . Hypertension   . Lymphedema of both lower extremities    Seeing OT at [place] to Left Lower leg  . Obesity   . Osteoarthritis   . Rheumatoid arthritis(714.0)   . Sleep apnea    cpap  . Urinary incontinence   . Venous insufficiency     Past Surgical History:  Procedure Laterality Date  . ABDOMINAL HYSTERECTOMY    . BLADDER SURGERY     sling  . BLADDER SUSPENSION  2009  . BREAST REDUCTION SURGERY    . COLONOSCOPY    . ESOPHAGOGASTRODUODENOSCOPY    . EYE SURGERY    . REPLACEMENT TOTAL KNEE Left   . TOE SURGERY Left    Left great toe  . TOTAL HIP ARTHROPLASTY    . TOTAL KNEE ARTHROPLASTY Right 12/09/2013   Procedure: Right Total Knee Arthroplasty;  Surgeon: Newt Minion, MD;  Location: [place];  Service: Orthopedics;  Laterality: Right;  Marland Kitchen VESICOVAGINAL FISTULA CLOSURE W/ TAH      There were no vitals filed for this visit.  Subjective Assessment - 08/12/18 1528    Subjective   Ms Inghram presents for OT visit 12/36 to address BLE LE. Pt presents  with compression wraps in place and is unaccompanied.    Patient is accompanied by:  Family member    Pertinent History  anxiety, depression gait disturbance, impaired transfers, HTN, OA, RA, OSA incontinence and CVI    Limitations  chronic leg pain and swelling, difficulty walking, decreased standing and walking tolwerance    Special Tests  positive Stemmer sign bilaterally, L>R    Currently in Pain?  No/denies    Pain Onset  More than a month ago                   OT Treatments/Exercises (OP) - 08/12/18 0001      ADLs   ADL Education Given  Yes      Manual Therapy   Manual Therapy  Edema management;Manual Lymphatic Drainage (MLD)    Manual Lymphatic Drainage (MLD)  MLD to LLE from proximal to distal utilizing short neck sequence bilaterally, deep abdominal breathing by patient, and functional inguinal pathways. No difficulty tolerating manual therapy observed.             OT Education - 08/12/18 1530    Education Details  Continued skilled Pt/caregiver education  And LE ADL training  throughout visit for lymphedema self care/ home program, including compression wrapping, compression garment and device wear/care, lymphatic pumping ther ex, simple self-MLD, and skin care. Discussed progress towards goals.    Person(s) Educated  Patient;Child(ren)    Methods  Explanation;Demonstration;Tactile cues;Verbal cues    Comprehension  Verbalized understanding;Returned demonstration;Verbal cues required;Tactile cues required;Need further instruction          OT Long Term Goals - 08/05/18 1824      OT LONG TERM GOAL #1   Title  Pt will be able to apply  knee length, multi-layer, short stretch compression wraps (one leg at a time) using correct gradient techniques with modified independence (extra time)  to achieve optimal limb volume reduction, and to return affected limb , as closely as possible, to premorbid size and shape.    Baseline  dependent 08/05/18 : Achieved on time     Time  4    Period  Days    Status  Achieved      OT LONG TERM GOAL #2   Title  Pt to achieve no less than 10% limb volume reductions below knees bilaterally during Intensive Phase CDT to improve AROM for ADLs, safe ambulation and transfers, to reduce infection risk, and to limit LE progression.    Baseline  dependent 08/05/18: Excellent  LLE progress with 8% volume reduction measured below the knee today.    Time  12    Period  Weeks    Status  Partially Met      OT LONG TERM GOAL #3   Title  Pt will achieve  100% compliance with daily LE self-care program with min caregiver assistance throughout Intensive Phase CDT, including daily  skin care, lymphatic pumping therex, simple self-MLD and prescribe compression to ensure optimal limb volume reduction, to limit infection risk and to limit LE progression.    Baseline  dependent 08/05/18 : Achieved on time    Time  12    Period  Weeks    Status  Achieved      OT LONG TERM GOAL #4   Title  Pt will demonstrate modified independence using  assistive devices and extra time  during LE self-care training to don and doff properly fitting, custom compression garments/devices( on order from preferred DME vendor)for optimal LE self-management over time.     Baseline  dependent    Time  12    Period  Weeks    Status  On-going            Plan - 08/12/18 1718    Clinical Impression Statement  Continues Manual therapy and comprression wrapping today as established. Pt engaged fully during session and continues to perform home program between sessions as directed. Consider completing anatomical measurements for lle COMPRESSION GARMENTS AND hos DEVICE NEXT VISIT.    OT Occupational Profile and History  Comprehensive Assessment- Review of records and extensive additional review of physical, cognitive, psychosocial history related to current functional performance    Occupational performance deficits (Please refer to evaluation for details):   ADL's;Work;Other;IADL's;Rest and Sleep;Leisure;Social Participation   functional ambulation and transfers, body image, role  performance   Body Structure / Function / Physical Skills  ADL;Decreased knowledge of precautions;Edema;Obesity;ROM;Skin integrity;Scar mobility;Mobility;IADL;Gait    Psychosocial Skills  Coping Strategies;Habits;Environmental  Adaptations    Rehab Potential  Good    Clinical Decision Making  Several treatment options, min-mod task modification necessary    Comorbidities Affecting Occupational Performance:  Presence of comorbidities impacting occupational performance  Comorbidities impacting occupational performance description:  see SUBJECTIVE for pertinent comorbidities    Modification or Assistance to Complete Evaluation   Min-Moderate modification of tasks or assist with assess necessary to complete eval    OT Frequency  3x / week    OT Duration  12 weeks    OT Treatment/Interventions  Self-care/ADL training;Therapeutic exercise;Energy conservation;Manual lymph drainage;Scar mobilization;Therapeutic activities;Patient/family education;Compression bandaging;Manual Therapy;DME and/or AE instruction    Plan  CCDT to include manual lymphatic drainage, skin care, comrpession wraps and custom garments once swelling is reduced, and therapeutic exercise for lymphatic pumping . Pt will be instructed in LE self care at all sessions in effort to limit LE progression and to facilitate retention of clinical gains over time.    Consulted and Agree with Plan of Care  Patient       Patient will benefit from skilled therapeutic intervention in order to improve the following deficits and impairments:   Body Structure / Function / Physical Skills: ADL, Decreased knowledge of precautions, Edema, Obesity, ROM, Skin integrity, Scar mobility, Mobility, IADL, Gait   Psychosocial Skills: Coping Strategies, Habits, Environmental  Adaptations   Visit Diagnosis: 1. Lymphedema, not elsewhere  classified       Problem List Patient Active Problem List   Diagnosis Date Noted  . Routine general medical examination at a health care facility 12/11/2016  . Primary osteoarthritis of left hip 05/01/2016  . Autoimmune disease ([place]) 11/29/2015  . High risk medication use 11/29/2015  . Vitamin D deficiency 11/29/2015  . Macular degeneration 11/29/2015  . Bilateral lower extremity edema 10/26/2015  . H/O total knee replacement, bilateral 12/09/2013  . Breast mass, left 01/18/2011  . Obesity 12/08/2009  . Venous (peripheral) insufficiency 07/28/2009  . PAIN IN JOINT, ANKLE AND FOOT 01/12/2008  . Obstructive sleep apnea 08/29/2007  . GAIT DISTURBANCE 08/14/2007  . Depression 08/26/2006  . Essential hypertension 08/26/2006  . Primary osteoarthritis of both hands 08/26/2006  . Urinary incontinence 08/26/2006    Andrey Spearman, MS, OTR/L, [facility] 08/12/18 5:20 PM   [place] MAIN [facility] SERVICES [address] [place], [place], [address] Phone: [phone]   Fax:  [phone]  Name: LOTOYA CASELLA MRN: [id number] Date of Birth: Apr 08, 1945

## 2018-08-14 ENCOUNTER — Ambulatory Visit: Payer: Medicare HMO | Admitting: Occupational Therapy

## 2018-08-14 ENCOUNTER — Other Ambulatory Visit: Payer: Self-pay

## 2018-08-14 DIAGNOSIS — I890 Lymphedema, not elsewhere classified: Secondary | ICD-10-CM | POA: Diagnosis not present

## 2018-08-14 NOTE — Therapy (Signed)
[place] MAIN [facility] SERVICES [address] [place], [place], [address] Phone: [phone]   Fax:  [phone]  Occupational Therapy Treatment  Patient Details  Name: Taylor Mccall MRN: [id number] Date of Birth: [birth year] Referring Provider (OT): Dorothyann Peng, NP   Encounter Date: 08/14/2018  OT End of Session - 08/14/18 1744    Visit Number  13    Number of Visits  36    Date for OT Re-Evaluation  09/24/18    OT Start Time  0415    OT Stop Time  0525    OT Time Calculation (min)  70 min    Activity Tolerance  Patient tolerated treatment well;No increased pain    Behavior During Therapy  WFL for tasks assessed/performed       Past Medical History:  Diagnosis Date  . Anxiety   . Blood transfusion without reported diagnosis   . Breast mass, left   . Cataract   . Depression   . Gait disturbance   . GERD (gastroesophageal reflux disease)   . Hypertension   . Lymphedema of both lower extremities    Seeing OT at [place] to Left Lower leg  . Obesity   . Osteoarthritis   . Rheumatoid arthritis(714.0)   . Sleep apnea    cpap  . Urinary incontinence   . Venous insufficiency     Past Surgical History:  Procedure Laterality Date  . ABDOMINAL HYSTERECTOMY    . BLADDER SURGERY     sling  . BLADDER SUSPENSION  2009  . BREAST REDUCTION SURGERY    . COLONOSCOPY    . ESOPHAGOGASTRODUODENOSCOPY    . EYE SURGERY    . REPLACEMENT TOTAL KNEE Left   . TOE SURGERY Left    Left great toe  . TOTAL HIP ARTHROPLASTY    . TOTAL KNEE ARTHROPLASTY Right 12/09/2013   Procedure: Right Total Knee Arthroplasty;  Surgeon: Newt Minion, MD;  Location: [place];  Service: Orthopedics;  Laterality: Right;  Marland Kitchen VESICOVAGINAL FISTULA CLOSURE W/ TAH      There were no vitals filed for this visit.  Subjective Assessment - 08/14/18 1515    Subjective   Ms Bentsen presents for OT visit 13/36 to address BLE LE. Pt presents  with compression wraps in place. Pt has no new complaints. She is in agreement with plan to complete LLE compression garment measurements today.    Patient is accompanied by:  Family member    Pertinent History  anxiety, depression gait disturbance, impaired transfers, HTN, OA, RA, OSA incontinence and CVI    Limitations  chronic leg pain and swelling, difficulty walking, decreased standing and walking tolwerance    Special Tests  positive Stemmer sign bilaterally, L>R    Pain Onset  More than a month ago                   OT Treatments/Exercises (OP) - 08/14/18 0001      ADLs   ADL Education Given  Yes      Manual Therapy   Manual Therapy  Edema management;Manual Lymphatic Drainage (MLD)    Edema Management  completed LLE anatomical measurements for custom LLE compression garments    Compression Bandaging  Gradient compression wraps x 3 over Rosidal foam from base of toes to below knee over cotton stockinette. Will modify PRN             OT Education - 08/14/18 1516  Education Details  Continued skilled Pt/caregiver education  And LE ADL training throughout visit for lymphedema self care/ home program, including compression wrapping, compression garment and device wear/care, lymphatic pumping ther ex, simple self-MLD, and skin care. Discussed progress towards goals.    Person(s) Educated  Patient;Child(ren)    Methods  Explanation;Demonstration;Tactile cues;Verbal cues    Comprehension  Verbalized understanding;Returned demonstration;Verbal cues required;Tactile cues required;Need further instruction          OT Long Term Goals - 08/05/18 1824      OT LONG TERM GOAL #1   Title  Pt will be able to apply  knee length, multi-layer, short stretch compression wraps (one leg at a time) using correct gradient techniques with modified independence (extra time)  to achieve optimal limb volume reduction, and to return affected limb , as closely as possible, to premorbid  size and shape.    Baseline  dependent 08/05/18 : Achieved on time    Time  4    Period  Days    Status  Achieved      OT LONG TERM GOAL #2   Title  Pt to achieve no less than 10% limb volume reductions below knees bilaterally during Intensive Phase CDT to improve AROM for ADLs, safe ambulation and transfers, to reduce infection risk, and to limit LE progression.    Baseline  dependent 08/05/18: Excellent  LLE progress with 8% volume reduction measured below the knee today.    Time  12    Period  Weeks    Status  Partially Met      OT LONG TERM GOAL #3   Title  Pt will achieve  100% compliance with daily LE self-care program with min caregiver assistance throughout Intensive Phase CDT, including daily  skin care, lymphatic pumping therex, simple self-MLD and prescribe compression to ensure optimal limb volume reduction, to limit infection risk and to limit LE progression.    Baseline  dependent 08/05/18 : Achieved on time    Time  12    Period  Weeks    Status  Achieved      OT LONG TERM GOAL #4   Title  Pt will demonstrate modified independence using  assistive devices and extra time  during LE self-care training to don and doff properly fitting, custom compression garments/devices( on order from preferred DME vendor)for optimal LE self-management over time.     Baseline  dependent    Time  12    Period  Weeks    Status  On-going            Plan - 08/14/18 1741    Clinical Impression Statement  Completed anatomical measurements for custom LLE knee length and thigh length compression garments and knee length HOS device. Custom compression garments are medically necessary to control chronic leg swelling and limit progression. HOS devices are needed bilaterally to limit fibrosis formation during HOS and limit worsening tissue integrity. Fit as soon as available from vender then shift CDT to RLE.    OT Occupational Profile and History  Comprehensive Assessment- Review of records and  extensive additional review of physical, cognitive, psychosocial history related to current functional performance    Occupational performance deficits (Please refer to evaluation for details):  ADL's;Work;Other;IADL's;Rest and Sleep;Leisure;Social Participation   functional ambulation and transfers, body image, role  performance   Body Structure / Function / Physical Skills  ADL;Decreased knowledge of precautions;Edema;Obesity;ROM;Skin integrity;Scar mobility;Mobility;IADL;Gait    Psychosocial Skills  Coping Strategies;Habits;Environmental  Adaptations  Rehab Potential  Good    Clinical Decision Making  Several treatment options, min-mod task modification necessary    Comorbidities Affecting Occupational Performance:  Presence of comorbidities impacting occupational performance    Comorbidities impacting occupational performance description:  see SUBJECTIVE for pertinent comorbidities    Modification or Assistance to Complete Evaluation   Min-Moderate modification of tasks or assist with assess necessary to complete eval    OT Frequency  3x / week    OT Duration  12 weeks    OT Treatment/Interventions  Self-care/ADL training;Therapeutic exercise;Energy conservation;Manual lymph drainage;Scar mobilization;Therapeutic activities;Patient/family education;Compression bandaging;Manual Therapy;DME and/or AE instruction    Plan  CCDT to include manual lymphatic drainage, skin care, comrpession wraps and custom garments once swelling is reduced, and therapeutic exercise for lymphatic pumping . Pt will be instructed in LE self care at all sessions in effort to limit LE progression and to facilitate retention of clinical gains over time.    Consulted and Agree with Plan of Care  Patient       Patient will benefit from skilled therapeutic intervention in order to improve the following deficits and impairments:   Body Structure / Function / Physical Skills: ADL, Decreased knowledge of precautions, Edema,  Obesity, ROM, Skin integrity, Scar mobility, Mobility, IADL, Gait   Psychosocial Skills: Coping Strategies, Habits, Environmental  Adaptations   Visit Diagnosis: 1. Lymphedema, not elsewhere classified       Problem List Patient Active Problem List   Diagnosis Date Noted  . Routine general medical examination at a health care facility 12/11/2016  . Primary osteoarthritis of left hip 05/01/2016  . Autoimmune disease ([place]) 11/29/2015  . High risk medication use 11/29/2015  . Vitamin D deficiency 11/29/2015  . Macular degeneration 11/29/2015  . Bilateral lower extremity edema 10/26/2015  . H/O total knee replacement, bilateral 12/09/2013  . Breast mass, left 01/18/2011  . Obesity 12/08/2009  . Venous (peripheral) insufficiency 07/28/2009  . PAIN IN JOINT, ANKLE AND FOOT 01/12/2008  . Obstructive sleep apnea 08/29/2007  . GAIT DISTURBANCE 08/14/2007  . Depression 08/26/2006  . Essential hypertension 08/26/2006  . Primary osteoarthritis of both hands 08/26/2006  . Urinary incontinence 08/26/2006    Andrey Spearman, MS, OTR/L, [facility] 08/14/18 5:45 PM  [place] MAIN [facility] [address] SERVICES [address] [place], [place], [address] Phone: [phone]   Fax:  [phone]  Name: AIRANNA PARTIN MRN: [id number] Date of Birth: [birth year]

## 2018-08-19 ENCOUNTER — Other Ambulatory Visit: Payer: Self-pay

## 2018-08-19 ENCOUNTER — Ambulatory Visit: Payer: Medicare HMO | Admitting: Occupational Therapy

## 2018-08-19 DIAGNOSIS — I890 Lymphedema, not elsewhere classified: Secondary | ICD-10-CM

## 2018-08-19 NOTE — Therapy (Signed)
[place] MAIN [facility] SERVICES [address] [place], [place], [address] Phone: [phone]   Fax:  [phone]  Occupational Therapy Treatment  Patient Details  Name: Taylor Mccall MRN: [id number] Date of Birth: [birth year] Referring Provider (OT): Taylor Peng, NP   Encounter Date: 08/19/2018  OT End of Session - 08/19/18 1458    Visit Number  14    Number of Visits  36    Date for OT Re-Evaluation  09/24/18    OT Start Time  0130    OT Stop Time  0220    OT Time Calculation (min)  50 min    Activity Tolerance  Patient tolerated treatment well;No increased pain    Behavior During Therapy  WFL for tasks assessed/performed       Past Medical History:  Diagnosis Date  . Anxiety   . Blood transfusion without reported diagnosis   . Breast mass, left   . Cataract   . Depression   . Gait disturbance   . GERD (gastroesophageal reflux disease)   . Hypertension   . Lymphedema of both lower extremities    Seeing OT at [facility] to Left Lower leg  . Obesity   . Osteoarthritis   . Rheumatoid arthritis(714.0)   . Sleep apnea    cpap  . Urinary incontinence   . Venous insufficiency     Past Surgical History:  Procedure Laterality Date  . ABDOMINAL HYSTERECTOMY    . BLADDER SURGERY     sling  . BLADDER SUSPENSION  2009  . BREAST REDUCTION SURGERY    . COLONOSCOPY    . ESOPHAGOGASTRODUODENOSCOPY    . EYE SURGERY    . REPLACEMENT TOTAL KNEE Left   . TOE SURGERY Left    Left great toe  . TOTAL HIP ARTHROPLASTY    . TOTAL KNEE ARTHROPLASTY Right 12/09/2013   Procedure: Right Total Knee Arthroplasty;  Surgeon: Newt Minion, MD;  Location: [facility];  Service: Orthopedics;  Laterality: Right;  Marland Kitchen VESICOVAGINAL FISTULA CLOSURE W/ TAH      There were no vitals filed for this visit.  Subjective Assessment - 08/19/18 1456    Subjective   [name] presents for OT visit 14/36 to address BLE LE. Pt presents  with compression wraps in place. Pt has no new complaints. Pt is unaccompanied.    Patient is accompanied by:  Family member    Pertinent History  anxiety, depression gait disturbance, impaired transfers, HTN, OA, RA, OSA incontinence and CVI    Limitations  chronic leg pain and swelling, difficulty walking, decreased standing and walking tolwerance    Special Tests  positive Stemmer sign bilaterally, L>R    Pain Onset  More than a month ago                   OT Treatments/Exercises (OP) - 08/19/18 0001      ADLs   ADL Education Given  Yes      Manual Therapy   Manual Therapy  Edema management;Manual Lymphatic Drainage (MLD)    Manual Lymphatic Drainage (MLD)  MLD to LLE from proximal to distal utilizing short neck sequence bilaterally, deep abdominal breathing by patient, and functional inguinal pathways. No difficulty tolerating manual therapy observed.    Compression Bandaging  Gradient compression wraps x 3 over Rosidal foam from base of toes to below knee over cotton stockinette. Will modify PRN  OT Education - 08/19/18 1456    Education Details  Continued skilled Pt/caregiver education  And LE ADL training throughout visit for lymphedema self care/ home program, including compression wrapping, compression garment and device wear/care, lymphatic pumping ther ex, simple self-MLD, and skin care. Discussed progress towards goals.    Person(s) Educated  Patient;Child(ren)    Methods  Explanation;Demonstration;Tactile cues;Verbal cues    Comprehension  Verbalized understanding;Returned demonstration;Verbal cues required;Tactile cues required;Need further instruction          OT Long Term Goals - 08/05/18 1824      OT LONG TERM GOAL #1   Title  Pt will be able to apply  knee length, multi-layer, short stretch compression wraps (one leg at a time) using correct gradient techniques with modified independence (extra time)  to achieve optimal limb volume  reduction, and to return affected limb , as closely as possible, to premorbid size and shape.    Baseline  dependent 08/05/18 : Achieved on time    Time  4    Period  Days    Status  Achieved      OT LONG TERM GOAL #2   Title  Pt to achieve no less than 10% limb volume reductions below knees bilaterally during Intensive Phase CDT to improve AROM for ADLs, safe ambulation and transfers, to reduce infection risk, and to limit LE progression.    Baseline  dependent 08/05/18: Excellent  LLE progress with 8% volume reduction measured below the knee today.    Time  12    Period  Weeks    Status  Partially Met      OT LONG TERM GOAL #3   Title  Pt will achieve  100% compliance with daily LE self-care program with min caregiver assistance throughout Intensive Phase CDT, including daily  skin care, lymphatic pumping therex, simple self-MLD and prescribe compression to ensure optimal limb volume reduction, to limit infection risk and to limit LE progression.    Baseline  dependent 08/05/18 : Achieved on time    Time  12    Period  Weeks    Status  Achieved      OT LONG TERM GOAL #4   Title  Pt will demonstrate modified independence using  assistive devices and extra time  during LE self-care training to don and doff properly fitting, custom compression garments/devices( on order from preferred DME vendor)for optimal LE self-management over time.     Baseline  dependent    Time  12    Period  Weeks    Status  On-going            Plan - 08/19/18 1458    Clinical Impression Statement  Pt continues to demonstrate steady progress towards all OT goals for CDT. She tolerates all aspects of manual therapy provided today. She continues to diligently and consistently perform all self care routines for LE self management between visits. Fit LLE garment as soon as delivered then shift CDT to RLE. Cont as per POC    OT Occupational Profile and History  Comprehensive Assessment- Review of records and  extensive additional review of physical, cognitive, psychosocial history related to current functional performance    Occupational performance deficits (Please refer to evaluation for details):  ADL's;Work;Other;IADL's;Rest and Sleep;Leisure;Social Participation   functional ambulation and transfers, body image, role  performance   Body Structure / Function / Physical Skills  ADL;Decreased knowledge of precautions;Edema;Obesity;ROM;Skin integrity;Scar mobility;Mobility;IADL;Gait    Psychosocial Skills  Coping Strategies;Habits;Environmental  Adaptations    Rehab Potential  Good    Clinical Decision Making  Several treatment options, min-mod task modification necessary    Comorbidities Affecting Occupational Performance:  Presence of comorbidities impacting occupational performance    Comorbidities impacting occupational performance description:  see SUBJECTIVE for pertinent comorbidities    Modification or Assistance to Complete Evaluation   Min-Moderate modification of tasks or assist with assess necessary to complete eval    OT Frequency  3x / week    OT Duration  12 weeks    OT Treatment/Interventions  Self-care/ADL training;Therapeutic exercise;Energy conservation;Manual lymph drainage;Scar mobilization;Therapeutic activities;Patient/family education;Compression bandaging;Manual Therapy;DME and/or AE instruction    Plan  CCDT to include manual lymphatic drainage, skin care, comrpession wraps and custom garments once swelling is reduced, and therapeutic exercise for lymphatic pumping . Pt will be instructed in LE self care at all sessions in effort to limit LE progression and to facilitate retention of clinical gains over time.    Consulted and Agree with Plan of Care  Patient       Patient will benefit from skilled therapeutic intervention in order to improve the following deficits and impairments:   Body Structure / Function / Physical Skills: ADL, Decreased knowledge of precautions, Edema,  Obesity, ROM, Skin integrity, Scar mobility, Mobility, IADL, Gait   Psychosocial Skills: Coping Strategies, Habits, Environmental  Adaptations   Visit Diagnosis: 1. Lymphedema, not elsewhere classified       Problem List Patient Active Problem List   Diagnosis Date Noted  . Routine general medical examination at a health care facility 12/11/2016  . Primary osteoarthritis of left hip 05/01/2016  . Autoimmune disease ([place]) 11/29/2015  . High risk medication use 11/29/2015  . Vitamin D deficiency 11/29/2015  . Macular degeneration 11/29/2015  . Bilateral lower extremity edema 10/26/2015  . H/O total knee replacement, bilateral 12/09/2013  . Breast mass, left 01/18/2011  . Obesity 12/08/2009  . Venous (peripheral) insufficiency 07/28/2009  . PAIN IN JOINT, ANKLE AND FOOT 01/12/2008  . Obstructive sleep apnea 08/29/2007  . GAIT DISTURBANCE 08/14/2007  . Depression 08/26/2006  . Essential hypertension 08/26/2006  . Primary osteoarthritis of both hands 08/26/2006  . Urinary incontinence 08/26/2006    Andrey Spearman, MS, OTR/L, [facility] 08/19/18 3:01 PM  [place] MAIN [facility] SERVICES [address] [place], [place], [address] Phone: [phone]   Fax:  [phone]  Name: Taylor Mccall MRN: [id number] Date of Birth: Apr 23, 1945

## 2018-08-21 ENCOUNTER — Ambulatory Visit: Payer: Medicare HMO | Admitting: Occupational Therapy

## 2018-08-21 ENCOUNTER — Other Ambulatory Visit: Payer: Self-pay

## 2018-08-21 DIAGNOSIS — I890 Lymphedema, not elsewhere classified: Secondary | ICD-10-CM | POA: Diagnosis not present

## 2018-08-21 NOTE — Progress Notes (Signed)
Office Visit Note  Patient: Taylor Mccall             Date of Birth: 01-23-1946           MRN: [id number]             PCP: Dorothyann Peng, NP Referring: Dorothyann Peng, NP Visit Date: 09/04/2018 Occupation: @GUAROCC @  Subjective:  Pain in both knee joints   History of Present Illness: Taylor Mccall is a 73 y.o. female with history of autoimmune disease and osteoarthritis.  She is taking plaquenil 200 mg 1 tablet by mouth BID.  She reports that she continues to have chronic bilateral knee pain.  She denies any swelling in her knees but does notice some warmth.  She states that she continues to go to physical therapy for management of lymphedema.  She states that she has no significant improvement in her lymphedema.  She states that she is started having increased right shoulder joint pain.  She is also note some limited range of motion.  She requires injection of the right shoulder on 04/03/2018 provided significant relief.  She is been taking Aleve as needed for pain relief.  She denies any recent rashes or photosensitivity.  She states that she does try to avoid sunlight.  She denies any symptoms of Raynaud's.  She has had some oral ulcerations but no nasal ulcerations.  She has chronic sicca symptoms.  She states her mouth dryness is more severe after using her CPAP machine.  She continues to have chronic fatigue related to insomnia.  She denies any recent fevers or swollen lymph nodes.    Activities of Daily Living:  Patient reports morning stiffness for 30 minutes.   Patient Denies nocturnal pain.  Difficulty dressing/grooming: Denies Difficulty climbing stairs: Reports Difficulty getting out of chair: Reports Difficulty using hands for taps, buttons, cutlery, and/or writing: Reports  Review of Systems  Constitutional: Positive for fatigue.  HENT: Positive for mouth sores and mouth dryness. Negative for nose dryness.   Eyes: Positive for dryness. Negative for pain and  visual disturbance.  Respiratory: Negative for cough, hemoptysis, shortness of breath and difficulty breathing.   Cardiovascular: Negative for chest pain, palpitations, hypertension and swelling in legs/feet.  Gastrointestinal: Positive for constipation. Negative for blood in stool and diarrhea.  Endocrine: Negative for increased urination.  Genitourinary: Negative for painful urination.  Musculoskeletal: Positive for arthralgias, joint pain and morning stiffness. Negative for joint swelling, myalgias, muscle weakness, muscle tenderness and myalgias.  Skin: Negative for color change, pallor, rash, hair loss, nodules/bumps, skin tightness, ulcers and sensitivity to sunlight.  Allergic/Immunologic: Negative for susceptible to infections.  Neurological: Negative for dizziness, numbness, headaches and weakness.  Hematological: Negative for swollen glands.  Psychiatric/Behavioral: Positive for depressed mood and sleep disturbance. The patient is nervous/anxious.     PMFS History:  Patient Active Problem List   Diagnosis Date Noted  . Routine general medical examination at a health care facility 12/11/2016  . Primary osteoarthritis of left hip 05/01/2016  . Autoimmune disease ([place]) 11/29/2015  . High risk medication use 11/29/2015  . Vitamin D deficiency 11/29/2015  . Macular degeneration 11/29/2015  . Bilateral lower extremity edema 10/26/2015  . H/O total knee replacement, bilateral 12/09/2013  . Breast mass, left 01/18/2011  . Obesity 12/08/2009  . Venous (peripheral) insufficiency 07/28/2009  . PAIN IN JOINT, ANKLE AND FOOT 01/12/2008  . Obstructive sleep apnea 08/29/2007  . GAIT DISTURBANCE 08/14/2007  . Depression 08/26/2006  . Essential hypertension  08/26/2006  . Primary osteoarthritis of both hands 08/26/2006  . Urinary incontinence 08/26/2006    Past Medical History:  Diagnosis Date  . Anxiety   . Blood transfusion without reported diagnosis   . Breast mass, left   .  Cataract   . Depression   . Gait disturbance   . GERD (gastroesophageal reflux disease)   . Hypertension   . Lymphedema of both lower extremities    Seeing OT at [facility] to Left Lower leg  . Obesity   . Osteoarthritis   . Rheumatoid arthritis(714.0)   . Sleep apnea    cpap  . Urinary incontinence   . Venous insufficiency     Family History  Problem Relation Age of Onset  . Diabetes Other   . Stroke Other   . Asthma Brother   . Heart disease Brother   . Coronary artery disease Father   . Skin cancer Father   . Heart disease Father   . Coronary artery disease Brother   . Colon cancer Neg Hx   . Esophageal cancer Neg Hx   . Rectal cancer Neg Hx   . Stomach cancer Neg Hx    Past Surgical History:  Procedure Laterality Date  . ABDOMINAL HYSTERECTOMY    . BLADDER SURGERY     sling  . BLADDER SUSPENSION  2009  . BREAST REDUCTION SURGERY    . COLONOSCOPY    . ESOPHAGOGASTRODUODENOSCOPY    . EYE SURGERY    . REPLACEMENT TOTAL KNEE Left   . TOE SURGERY Left    Left great toe  . TOTAL HIP ARTHROPLASTY    . TOTAL KNEE ARTHROPLASTY Right 12/09/2013   Procedure: Right Total Knee Arthroplasty;  Surgeon: Newt Minion, MD;  Location: [facility];  Service: Orthopedics;  Laterality: Right;  Marland Kitchen VESICOVAGINAL FISTULA CLOSURE W/ TAH     Social History   Social History Narrative  . Not on file   Immunization History  Administered Date(s) Administered  . Influenza Split 12/14/2010, 12/06/2012  . Influenza Whole 02/06/2004, 12/08/2009  . Influenza, High Dose Seasonal PF 10/26/2015, 12/11/2016  . Influenza,inj,Quad PF,6+ Mos 10/15/2013, 11/19/2014  . Pneumococcal Conjugate-13 03/18/2013  . Pneumococcal Polysaccharide-23 06/30/2008, 10/26/2015  . Td 02/05/2005  . Tdap 12/14/2010     Objective: Vital Signs: BP 132/83 (BP Location: Right Arm, Patient Position: Sitting, Cuff Size: Large)   Pulse 83   Resp 14   Ht 5\' 10"  (1.778 m)   Wt 274 lb (124.3 kg)    BMI 39.31 kg/m    Physical Exam Vitals signs and nursing note reviewed.  Constitutional:      Appearance: She is well-developed.  HENT:     Head: Normocephalic and atraumatic.  Eyes:     Conjunctiva/sclera: Conjunctivae normal.  Neck:     Musculoskeletal: Normal range of motion.  Cardiovascular:     Rate and Rhythm: Normal rate and regular rhythm.     Heart sounds: Normal heart sounds.  Pulmonary:     Effort: Pulmonary effort is normal.     Breath sounds: Normal breath sounds.  Abdominal:     General: Bowel sounds are normal.     Palpations: Abdomen is soft.  Lymphadenopathy:     Cervical: No cervical adenopathy.  Skin:    General: Skin is warm and dry.     Capillary Refill: Capillary refill takes less than 2 seconds.  Neurological:     Mental Status: She is alert and oriented to  person, place, and time.  Psychiatric:        Behavior: Behavior normal.      Musculoskeletal Exam: C-spine limited range of motion.  Thoracic episodes noted.  No midline spinal tenderness.  Shoulder joints have good range of motion with some discomfort in the right shoulder.  Elbow joints, wrist joints, MCPs, PIPs and DIPs good range of motion no synovitis.  She has complete fist formation bilaterally.  Right hip replacement has good range of motion with no discomfort.  Knee replacements have good range of motion with mild warmth but no effusion.  Lymphedema noted in bilateral lower extremities.  CDAI Exam: CDAI Score: - Patient Global: -; Provider Global: - Swollen: -; Tender: - Joint Exam   No joint exam has been documented for this visit   There is currently no information documented on the homunculus. Go to the Rheumatology activity and complete the homunculus joint exam.  Investigation: No additional findings.  Imaging: No results found.  Recent Labs: Lab Results  Component Value Date   WBC 4.0 02/11/2018   HGB 12.3 02/11/2018   PLT 242.0 02/11/2018   NA 140 02/11/2018   K  3.8 02/11/2018   CL 105 02/11/2018   CO2 28 02/11/2018   GLUCOSE 75 02/11/2018   BUN 14 02/11/2018   CREATININE 0.75 02/11/2018   BILITOT 0.8 02/11/2018   ALKPHOS 107 02/11/2018   AST 13 02/11/2018   ALT 10 02/11/2018   PROT 6.5 02/11/2018   ALBUMIN 4.0 02/11/2018   CALCIUM 9.5 02/11/2018   GFRAA 87 11/01/2017    Speciality Comments: PLQ eye exam: 03/31/18 WNL @ [facility]  Procedures:  Large Joint Inj: R glenohumeral on 09/04/2018 9:32 AM Indications: pain Details: 27 G 1.5 in needle, posterior approach  Arthrogram: No  Medications: 1.5 mL lidocaine 1 %; 40 mg triamcinolone acetonide 40 MG/ML Aspirate: 0 mL Outcome: tolerated well, no immediate complications Procedure, treatment alternatives, risks and benefits explained, specific risks discussed. Consent was given by the patient. Immediately prior to procedure a time out was called to verify the correct patient, procedure, equipment, support staff and site/side marked as required. Patient was prepped and draped in the usual sterile fashion.     Allergies: Penicillins   Assessment / Plan:     Visit Diagnoses: Autoimmune disease ([place]) - +ANA, +RNP: She has not had any signs or symptoms of a flare recently.  She is clinically doing well on Plaquenil 200 mg 1 tablet by mouth twice daily.  She has no synovitis on exam.  She has warmth and chronic pain in bilateral knee replacements.  No Malar rash was noted.  She has not had any recent rashes.  She has no symptoms of Raynaud's and no digital ulcerations or signs of gangrene were noted.  She has chronic sicca symptoms.  She currently has 1 oral ulceration but no nasal ulcerations.  She has chronic fatigue related to insomnia.  She has not had any recent fevers or swollen lymph nodes. She has not had shortness of breath or palpitations.   She will continue taking Plaquenil 200 mg 1 tablet BID.  She does not need any refills at this time.  We will check autoimmune labs today.  She  was advised to notify us if she develops any signs or symptoms of a flare.  She will follow up in 5 months. - Plan: CBC with Differential/Platelet, COMPLETE METABOLIC PANEL WITH GFR, Urinalysis, Routine w reflex microscopic, Anti-DNA antibody, double-stranded, C3 and C4, Sedimentation  rate  High risk medication use - Plaquenil 200 mg 1 tablet by mout twice daily.  Last Plaquenil eye exam normal on 09/19/2017.  CBC and CMP will be drawn today to monitor for drug toxicity. - Plan: CBC with Differential/Platelet, COMPLETE METABOLIC PANEL WITH GFR  Primary osteoarthritis of both hands - She has PIP and DIP synovial thickening consistent with osteoarthritis of both hands.  Complete fist formation bilaterally.  She has no tenderness or synovitis on exam.  Joint protection and muscle strengthening were discussed.   H/O total knee replacement, bilateral - Chronic pain.  She has warmth but no effusion. She walks with a wheeled walker.   Primary osteoarthritis of left hip - She has discomfort with ROM.   Vitamin D deficiency -We will check vitamin D level today.  Plan: VITAMIN D 25 Hydroxy (Vit-D Deficiency, Fractures)  Osteoporosis screening -DEXA was ordered today.  Plan: DG BONE DENSITY (DXA),  Chronic right shoulder pain: She presents today with ongoing right shoulder pain.  She has good ROM on exam with some discomfort.  She had a x-ray of the right shoulder on 04/03/18 that revealed a high rising humerus but no significant glenohumeral joint space narrowing.  Inferior spur noted.  She had a cortisone injection on 04/03/18 that provided significant relief, but her discomfort has returned and she would like a repeat injection today. She tolerated the procedure.  Procedure note completed above.   Other medical conditions are listed as follows:   History of macular degeneration   History of sleep apnea  History of depression  Pedal edema   Orders: Orders Placed This Encounter  Procedures  . Large  Joint Inj  . DG BONE DENSITY (DXA)  . CBC with Differential/Platelet  . COMPLETE METABOLIC PANEL WITH GFR  . Urinalysis, Routine w reflex microscopic  . Anti-DNA antibody, double-stranded  . C3 and C4  . Sedimentation rate  . VITAMIN D 25 Hydroxy (Vit-D Deficiency, Fractures)   No orders of the defined types were placed in this encounter.   Face-to-face time spent with patient was 30 minutes. Greater than 50% of time was spent in counseling and coordination of care.  Follow-Up Instructions: Return in about 5 months (around 02/04/2019) for Autoimmune Disease.   Ofilia Neas, PA-C  Note - This record has been created using Dragon software.  Chart creation errors have been sought, but may not always  have been located. Such creation errors do not reflect on  the standard of medical care.

## 2018-08-21 NOTE — Therapy (Signed)
[place] MAIN [facility] SERVICES [address] [place], [place], [address] Phone: [phone]   Fax:  [phone]  Occupational Therapy Treatment  Patient Details  Name: Taylor Mccall MRN: [id number] Date of Birth: 1945-12-25 Referring Provider (OT): Taylor Peng, NP   Encounter Date: 08/21/2018  OT End of Session - 08/21/18 1654    Visit Number  15    Number of Visits  36    Date for OT Re-Evaluation  09/24/18    OT Start Time  0215    OT Stop Time  0315    OT Time Calculation (min)  60 min    Activity Tolerance  Patient tolerated treatment well;No increased pain    Behavior During Therapy  WFL for tasks assessed/performed       Past Medical History:  Diagnosis Date  . Anxiety   . Blood transfusion without reported diagnosis   . Breast mass, left   . Cataract   . Depression   . Gait disturbance   . GERD (gastroesophageal reflux disease)   . Hypertension   . Lymphedema of both lower extremities    Seeing OT at [facility] to Left Lower leg  . Obesity   . Osteoarthritis   . Rheumatoid arthritis(714.0)   . Sleep apnea    cpap  . Urinary incontinence   . Venous insufficiency     Past Surgical History:  Procedure Laterality Date  . ABDOMINAL HYSTERECTOMY    . BLADDER SURGERY     sling  . BLADDER SUSPENSION  2009  . BREAST REDUCTION SURGERY    . COLONOSCOPY    . ESOPHAGOGASTRODUODENOSCOPY    . EYE SURGERY    . REPLACEMENT TOTAL KNEE Left   . TOE SURGERY Left    Left great toe  . TOTAL HIP ARTHROPLASTY    . TOTAL KNEE ARTHROPLASTY Right 12/09/2013   Procedure: Right Total Knee Arthroplasty;  Surgeon: Newt Minion, MD;  Location: [facility];  Service: Orthopedics;  Laterality: Right;  Marland Kitchen VESICOVAGINAL FISTULA CLOSURE W/ TAH      There were no vitals filed for this visit.  Subjective Assessment - 08/21/18 1653    Subjective   Taylor Mccall presents for OT visit 15/36 to address BLE LE. Pt presents  with compression wraps in place. Pt has no new complaints. Pt is accompanied by her daughter.    Patient is accompanied by:  Family member    Pertinent History  anxiety, depression gait disturbance, impaired transfers, HTN, OA, RA, OSA incontinence and CVI    Limitations  chronic leg pain and swelling, difficulty walking, decreased standing and walking tolwerance    Special Tests  positive Stemmer sign bilaterally, L>R    Pain Onset  More than a month ago                           OT Education - 08/21/18 1653    Education Details  Continued skilled Pt/caregiver education  And LE ADL training throughout visit for lymphedema self care/ home program, including compression wrapping, compression garment and device wear/care, lymphatic pumping ther ex, simple self-MLD, and skin care. Discussed progress towards goals.    Person(s) Educated  Patient;Child(ren)    Methods  Explanation;Demonstration;Tactile cues;Verbal cues    Comprehension  Verbalized understanding;Returned demonstration;Verbal cues required;Tactile cues required;Need further instruction          OT Long Term Goals - 08/05/18 1824  OT LONG TERM GOAL #1   Title  Pt will be able to apply  knee length, multi-layer, short stretch compression wraps (one leg at a time) using correct gradient techniques with modified independence (extra time)  to achieve optimal limb volume reduction, and to return affected limb , as closely as possible, to premorbid size and shape.    Baseline  dependent 08/05/18 : Achieved on time    Time  4    Period  Days    Status  Achieved      OT LONG TERM GOAL #2   Title  Pt to achieve no less than 10% limb volume reductions below knees bilaterally during Intensive Phase CDT to improve AROM for ADLs, safe ambulation and transfers, to reduce infection risk, and to limit LE progression.    Baseline  dependent 08/05/18: Excellent  LLE progress with 8% volume reduction measured below the knee  today.    Time  12    Period  Weeks    Status  Partially Met      OT LONG TERM GOAL #3   Title  Pt will achieve  100% compliance with daily LE self-care program with min caregiver assistance throughout Intensive Phase CDT, including daily  skin care, lymphatic pumping therex, simple self-MLD and prescribe compression to ensure optimal limb volume reduction, to limit infection risk and to limit LE progression.    Baseline  dependent 08/05/18 : Achieved on time    Time  12    Period  Weeks    Status  Achieved      OT LONG TERM GOAL #4   Title  Pt will demonstrate modified independence using  assistive devices and extra time  during LE self-care training to don and doff properly fitting, custom compression garments/devices( on order from preferred DME vendor)for optimal LE self-management over time.     Baseline  dependent    Time  12    Period  Weeks    Status  On-going            Plan - 08/21/18 1654    Clinical Impression Statement  Provided MLD, skin care and compression therapy to LLE as established. Pt continues to demonstrate progress despite less than 85% compliance with compression wraps between visits. Cont as per POC. Fit LLE compression garments as soon as acvailable and shift CDT to RLE.    OT Occupational Profile and History  Comprehensive Assessment- Review of records and extensive additional review of physical, cognitive, psychosocial history related to current functional performance    Occupational performance deficits (Please refer to evaluation for details):  ADL's;Work;Other;IADL's;Rest and Sleep;Leisure;Social Participation   functional ambulation and transfers, body image, role  performance   Body Structure / Function / Physical Skills  ADL;Decreased knowledge of precautions;Edema;Obesity;ROM;Skin integrity;Scar mobility;Mobility;IADL;Gait    Psychosocial Skills  Coping Strategies;Habits;Environmental  Adaptations    Rehab Potential  Good    Clinical Decision  Making  Several treatment options, min-mod task modification necessary    Comorbidities Affecting Occupational Performance:  Presence of comorbidities impacting occupational performance    Comorbidities impacting occupational performance description:  see SUBJECTIVE for pertinent comorbidities    Modification or Assistance to Complete Evaluation   Min-Moderate modification of tasks or assist with assess necessary to complete eval    OT Frequency  3x / week    OT Duration  12 weeks    OT Treatment/Interventions  Self-care/ADL training;Therapeutic exercise;Energy conservation;Manual lymph drainage;Scar mobilization;Therapeutic activities;Patient/family education;Compression bandaging;Manual Therapy;DME and/or AE instruction  Plan  CCDT to include manual lymphatic drainage, skin care, comrpession wraps and custom garments once swelling is reduced, and therapeutic exercise for lymphatic pumping . Pt will be instructed in LE self care at all sessions in effort to limit LE progression and to facilitate retention of clinical gains over time.    Consulted and Agree with Plan of Care  Patient       Patient will benefit from skilled therapeutic intervention in order to improve the following deficits and impairments:   Body Structure / Function / Physical Skills: ADL, Decreased knowledge of precautions, Edema, Obesity, ROM, Skin integrity, Scar mobility, Mobility, IADL, Gait   Psychosocial Skills: Coping Strategies, Habits, Environmental  Adaptations   Visit Diagnosis: 1. Lymphedema, not elsewhere classified       Problem List Patient Active Problem List   Diagnosis Date Noted  . Routine general medical examination at a health care facility 12/11/2016  . Primary osteoarthritis of left hip 05/01/2016  . Autoimmune disease ([place]) 11/29/2015  . High risk medication use 11/29/2015  . Vitamin D deficiency 11/29/2015  . Macular degeneration 11/29/2015  . Bilateral lower extremity edema 10/26/2015   . H/O total knee replacement, bilateral 12/09/2013  . Breast mass, left 01/18/2011  . Obesity 12/08/2009  . Venous (peripheral) insufficiency 07/28/2009  . PAIN IN JOINT, ANKLE AND FOOT 01/12/2008  . Obstructive sleep apnea 08/29/2007  . GAIT DISTURBANCE 08/14/2007  . Depression 08/26/2006  . Essential hypertension 08/26/2006  . Primary osteoarthritis of both hands 08/26/2006  . Urinary incontinence 08/26/2006    Andrey Spearman, Taylor, OTR/L, [facility] 08/21/18 4:56 PM  [place] MAIN [facility] SERVICES [address] [place], [place], [address] Phone: [phone]   Fax:  [phone]  Name: Taylor Mccall MRN: [id number] Date of Birth: [birth year]

## 2018-08-25 ENCOUNTER — Encounter: Payer: Medicare HMO | Admitting: Occupational Therapy

## 2018-08-26 ENCOUNTER — Other Ambulatory Visit: Payer: Self-pay

## 2018-08-26 ENCOUNTER — Ambulatory Visit: Payer: Medicare HMO | Admitting: Occupational Therapy

## 2018-08-26 DIAGNOSIS — I890 Lymphedema, not elsewhere classified: Secondary | ICD-10-CM | POA: Diagnosis not present

## 2018-08-27 ENCOUNTER — Encounter: Payer: Medicare HMO | Admitting: Occupational Therapy

## 2018-08-27 NOTE — Therapy (Signed)
[place] MAIN [facility] SERVICES [address] [place], [place], [address] Phone: [phone]   Fax:  [phone]  Occupational Therapy Treatment  Patient Details  Name: Taylor Mccall MRN: [id number] Date of Birth: [birth year] Referring Provider (OT): Dorothyann Peng, NP   Encounter Date: 08/26/2018  OT End of Session - 08/26/18 1228    Visit Number  16    Number of Visits  36    Date for OT Re-Evaluation  09/24/18    OT Start Time  0115    OT Stop Time  0215    OT Time Calculation (min)  60 min    Activity Tolerance  Patient tolerated treatment well;No increased pain    Behavior During Therapy  WFL for tasks assessed/performed       Past Medical History:  Diagnosis Date  . Anxiety   . Blood transfusion without reported diagnosis   . Breast mass, left   . Cataract   . Depression   . Gait disturbance   . GERD (gastroesophageal reflux disease)   . Hypertension   . Lymphedema of both lower extremities    Seeing OT at [facility] to Left Lower leg  . Obesity   . Osteoarthritis   . Rheumatoid arthritis(714.0)   . Sleep apnea    cpap  . Urinary incontinence   . Venous insufficiency     Past Surgical History:  Procedure Laterality Date  . ABDOMINAL HYSTERECTOMY    . BLADDER SURGERY     sling  . BLADDER SUSPENSION  2009  . BREAST REDUCTION SURGERY    . COLONOSCOPY    . ESOPHAGOGASTRODUODENOSCOPY    . EYE SURGERY    . REPLACEMENT TOTAL KNEE Left   . TOE SURGERY Left    Left great toe  . TOTAL HIP ARTHROPLASTY    . TOTAL KNEE ARTHROPLASTY Right 12/09/2013   Procedure: Right Total Knee Arthroplasty;  Surgeon: Newt Minion, MD;  Location: [facility];  Service: Orthopedics;  Laterality: Right;  Marland Kitchen VESICOVAGINAL FISTULA CLOSURE W/ TAH      There were no vitals filed for this visit.  Subjective Assessment - 08/26/18 1320    Subjective   [name] presents for OT visit 16//36 to address BLE LE. Pt presents  with compression wraps in place. Pt tells me she hasnt heard from DME vendor yet.    Patient is accompanied by:  Family member    Pertinent History  anxiety, depression gait disturbance, impaired transfers, HTN, OA, RA, OSA incontinence and CVI    Limitations  chronic leg pain and swelling, difficulty walking, decreased standing and walking tolwerance    Special Tests  positive Stemmer sign bilaterally, L>R    Pain Onset  More than a month ago                           OT Education - 08/27/18 1228    Education Details  Continued skilled Pt/caregiver education  And LE ADL training throughout visit for lymphedema self care/ home program, including compression wrapping, compression garment and device wear/care, lymphatic pumping ther ex, simple self-MLD, and skin care. Discussed progress towards goals.    Person(s) Educated  Patient;Child(ren)    Methods  Explanation;Demonstration;Tactile cues;Verbal cues    Comprehension  Verbalized understanding;Returned demonstration;Verbal cues required;Tactile cues required;Need further instruction          OT Long Term Goals - 08/05/18 1824  OT LONG TERM GOAL #1   Title  Pt will be able to apply  knee length, multi-layer, short stretch compression wraps (one leg at a time) using correct gradient techniques with modified independence (extra time)  to achieve optimal limb volume reduction, and to return affected limb , as closely as possible, to premorbid size and shape.    Baseline  dependent 08/05/18 : Achieved on time    Time  4    Period  Days    Status  Achieved      OT LONG TERM GOAL #2   Title  Pt to achieve no less than 10% limb volume reductions below knees bilaterally during Intensive Phase CDT to improve AROM for ADLs, safe ambulation and transfers, to reduce infection risk, and to limit LE progression.    Baseline  dependent 08/05/18: Excellent  LLE progress with 8% volume reduction measured below the knee today.     Time  12    Period  Weeks    Status  Partially Met      OT LONG TERM GOAL #3   Title  Pt will achieve  100% compliance with daily LE self-care program with min caregiver assistance throughout Intensive Phase CDT, including daily  skin care, lymphatic pumping therex, simple self-MLD and prescribe compression to ensure optimal limb volume reduction, to limit infection risk and to limit LE progression.    Baseline  dependent 08/05/18 : Achieved on time    Time  12    Period  Weeks    Status  Achieved      OT LONG TERM GOAL #4   Title  Pt will demonstrate modified independence using  assistive devices and extra time  during LE self-care training to don and doff properly fitting, custom compression garments/devices( on order from preferred DME vendor)for optimal LE self-management over time.     Baseline  dependent    Time  12    Period  Weeks    Status  On-going            Plan - 08/26/18 1229    Clinical Impression Statement  Pt tolerated self care edu, MLD, skin care and compression wraps today without difficulty. Limb volume reductionis obvious and Pt reports easier transfers, especially to/ from car. Cont as per POC. Fit custom garments ASAP.    OT Occupational Profile and History  Comprehensive Assessment- Review of records and extensive additional review of physical, cognitive, psychosocial history related to current functional performance    Occupational performance deficits (Please refer to evaluation for details):  ADL's;Work;Other;IADL's;Rest and Sleep;Leisure;Social Participation   functional ambulation and transfers, body image, role  performance   Body Structure / Function / Physical Skills  ADL;Decreased knowledge of precautions;Edema;Obesity;ROM;Skin integrity;Scar mobility;Mobility;IADL;Gait    Psychosocial Skills  Coping Strategies;Habits;Environmental  Adaptations    Rehab Potential  Good    Clinical Decision Making  Several treatment options, min-mod task modification  necessary    Comorbidities Affecting Occupational Performance:  Presence of comorbidities impacting occupational performance    Comorbidities impacting occupational performance description:  see SUBJECTIVE for pertinent comorbidities    Modification or Assistance to Complete Evaluation   Min-Moderate modification of tasks or assist with assess necessary to complete eval    OT Frequency  3x / week    OT Duration  12 weeks    OT Treatment/Interventions  Self-care/ADL training;Therapeutic exercise;Energy conservation;Manual lymph drainage;Scar mobilization;Therapeutic activities;Patient/family education;Compression bandaging;Manual Therapy;DME and/or AE instruction    Plan  CCDT to include  manual lymphatic drainage, skin care, comrpession wraps and custom garments once swelling is reduced, and therapeutic exercise for lymphatic pumping . Pt will be instructed in LE self care at all sessions in effort to limit LE progression and to facilitate retention of clinical gains over time.    Consulted and Agree with Plan of Care  Patient       Patient will benefit from skilled therapeutic intervention in order to improve the following deficits and impairments:   Body Structure / Function / Physical Skills: ADL, Decreased knowledge of precautions, Edema, Obesity, ROM, Skin integrity, Scar mobility, Mobility, IADL, Gait   Psychosocial Skills: Coping Strategies, Habits, Environmental  Adaptations   Visit Diagnosis: 1. Lymphedema, not elsewhere classified       Problem List Patient Active Problem List   Diagnosis Date Noted  . Routine general medical examination at a health care facility 12/11/2016  . Primary osteoarthritis of left hip 05/01/2016  . Autoimmune disease ([place]) 11/29/2015  . High risk medication use 11/29/2015  . Vitamin D deficiency 11/29/2015  . Macular degeneration 11/29/2015  . Bilateral lower extremity edema 10/26/2015  . H/O total knee replacement, bilateral 12/09/2013  . Breast  mass, left 01/18/2011  . Obesity 12/08/2009  . Venous (peripheral) insufficiency 07/28/2009  . PAIN IN JOINT, ANKLE AND FOOT 01/12/2008  . Obstructive sleep apnea 08/29/2007  . GAIT DISTURBANCE 08/14/2007  . Depression 08/26/2006  . Essential hypertension 08/26/2006  . Primary osteoarthritis of both hands 08/26/2006  . Urinary incontinence 08/26/2006    Andrey Spearman, MS, OTR/L, [facility] 08/27/18 12:31 PM  [place] MAIN [facility] SERVICES [address] [place], [place], [address] Phone: [phone]   Fax:  [phone]  Name: ALIZEA PELL MRN: [id number] Date of Birth: August 08, 1945

## 2018-08-28 ENCOUNTER — Other Ambulatory Visit: Payer: Self-pay

## 2018-08-28 ENCOUNTER — Ambulatory Visit: Payer: Medicare HMO | Admitting: Occupational Therapy

## 2018-08-28 DIAGNOSIS — I890 Lymphedema, not elsewhere classified: Secondary | ICD-10-CM | POA: Diagnosis not present

## 2018-08-28 NOTE — Therapy (Signed)
[place] MAIN [facility] SERVICES [address] [place], [place], [address] Phone: [phone]   Fax:  [phone]  Occupational Therapy Treatment  Patient Details  Name: Taylor Mccall MRN: [id number] Date of Birth: 1946/01/20 Referring Provider (OT): Dorothyann Peng, NP   Encounter Date: 08/28/2018  OT End of Session - 08/28/18 1431    Visit Number  17    Number of Visits  36    Date for OT Re-Evaluation  09/24/18    OT Start Time  1115    OT Stop Time  1220    OT Time Calculation (min)  65 min    Activity Tolerance  Patient tolerated treatment well;No increased pain    Behavior During Therapy  WFL for tasks assessed/performed       Past Medical History:  Diagnosis Date  . Anxiety   . Blood transfusion without reported diagnosis   . Breast mass, left   . Cataract   . Depression   . Gait disturbance   . GERD (gastroesophageal reflux disease)   . Hypertension   . Lymphedema of both lower extremities    Seeing OT at [place] to Left Lower leg  . Obesity   . Osteoarthritis   . Rheumatoid arthritis(714.0)   . Sleep apnea    cpap  . Urinary incontinence   . Venous insufficiency     Past Surgical History:  Procedure Laterality Date  . ABDOMINAL HYSTERECTOMY    . BLADDER SURGERY     sling  . BLADDER SUSPENSION  2009  . BREAST REDUCTION SURGERY    . COLONOSCOPY    . ESOPHAGOGASTRODUODENOSCOPY    . EYE SURGERY    . REPLACEMENT TOTAL KNEE Left   . TOE SURGERY Left    Left great toe  . TOTAL HIP ARTHROPLASTY    . TOTAL KNEE ARTHROPLASTY Right 12/09/2013   Procedure: Right Total Knee Arthroplasty;  Surgeon: Newt Minion, MD;  Location: [place];  Service: Orthopedics;  Laterality: Right;  Marland Kitchen VESICOVAGINAL FISTULA CLOSURE W/ TAH      There were no vitals filed for this visit.  Subjective Assessment - 08/28/18 1122    Subjective   [name] presents for OT visit 17//36 to address BLE LE. Pt presents  with compression wraps in place. Pt reports she paid DME vendor for recommended compression garments and devices and vendor emailed OT confirming that they have been ordered.    Patient is accompanied by:  Family member    Pertinent History  anxiety, depression gait disturbance, impaired transfers, HTN, OA, RA, OSA incontinence and CVI    Limitations  chronic leg pain and swelling, difficulty walking, decreased standing and walking tolwerance    Special Tests  positive Stemmer sign bilaterally, L>R    Pain Onset  More than a month ago                   OT Treatments/Exercises (OP) - 08/28/18 0001      ADLs   ADL Education Given  Yes  (Pended)       Manual Therapy   Manual Therapy  Edema management;Manual Lymphatic Drainage (MLD)  (Pended)     Manual Lymphatic Drainage (MLD)  MLD to LLE from proximal to distal utilizing short neck sequence bilaterally, deep abdominal breathing by patient, and functional inguinal pathways. No difficulty tolerating manual therapy observed.  (Pended)     Compression Bandaging  Gradient compression wraps x 3 over  Rosidal foam from base of toes to below knee over cotton stockinette. Will modify PRN  (Pended)              OT Education - 08/28/18 1430    Education Details  Continued skilled Pt/caregiver education  And LE ADL training throughout visit for lymphedema self care/ home program, including compression wrapping, compression garment and device wear/care, lymphatic pumping ther ex, simple self-MLD, and skin care. Discussed progress towards goals.    Person(s) Educated  Patient;Child(ren)    Methods  Explanation;Demonstration;Tactile cues;Verbal cues    Comprehension  Verbalized understanding;Returned demonstration;Verbal cues required;Tactile cues required;Need further instruction          OT Long Term Goals - 08/05/18 1824      OT LONG TERM GOAL #1   Title  Pt will be able to apply  knee length, multi-layer, short stretch  compression wraps (one leg at a time) using correct gradient techniques with modified independence (extra time)  to achieve optimal limb volume reduction, and to return affected limb , as closely as possible, to premorbid size and shape.    Baseline  dependent 08/05/18 : Achieved on time    Time  4    Period  Days    Status  Achieved      OT LONG TERM GOAL #2   Title  Pt to achieve no less than 10% limb volume reductions below knees bilaterally during Intensive Phase CDT to improve AROM for ADLs, safe ambulation and transfers, to reduce infection risk, and to limit LE progression.    Baseline  dependent 08/05/18: Excellent  LLE progress with 8% volume reduction measured below the knee today.    Time  12    Period  Weeks    Status  Partially Met      OT LONG TERM GOAL #3   Title  Pt will achieve  100% compliance with daily LE self-care program with min caregiver assistance throughout Intensive Phase CDT, including daily  skin care, lymphatic pumping therex, simple self-MLD and prescribe compression to ensure optimal limb volume reduction, to limit infection risk and to limit LE progression.    Baseline  dependent 08/05/18 : Achieved on time    Time  12    Period  Weeks    Status  Achieved      OT LONG TERM GOAL #4   Title  Pt will demonstrate modified independence using  assistive devices and extra time  during LE self-care training to don and doff properly fitting, custom compression garments/devices( on order from preferred DME vendor)for optimal LE self-management over time.     Baseline  dependent    Time  12    Period  Weeks    Status  On-going            Plan - 08/28/18 1432    Clinical Impression Statement  Provided MLD, skin care and compression therapy to LLE as established. Pt continues to demonstrate progresstowards all OT goals for LE care. Cont as per POC. Custom LLE compression garments are on order.    OT Occupational Profile and History  Comprehensive Assessment-  Review of records and extensive additional review of physical, cognitive, psychosocial history related to current functional performance    Occupational performance deficits (Please refer to evaluation for details):  ADL's;Work;Other;IADL's;Rest and Sleep;Leisure;Social Participation   functional ambulation and transfers, body image, role  performance   Body Structure / Function / Physical Skills  ADL;Decreased knowledge of precautions;Edema;Obesity;ROM;Skin integrity;Scar mobility;Mobility;IADL;Gait  Psychosocial Skills  Coping Strategies;Habits;Environmental  Adaptations    Rehab Potential  Good    Clinical Decision Making  Several treatment options, min-mod task modification necessary    Comorbidities Affecting Occupational Performance:  Presence of comorbidities impacting occupational performance    Comorbidities impacting occupational performance description:  see SUBJECTIVE for pertinent comorbidities    Modification or Assistance to Complete Evaluation   Min-Moderate modification of tasks or assist with assess necessary to complete eval    OT Frequency  3x / week    OT Duration  12 weeks    OT Treatment/Interventions  Self-care/ADL training;Therapeutic exercise;Energy conservation;Manual lymph drainage;Scar mobilization;Therapeutic activities;Patient/family education;Compression bandaging;Manual Therapy;DME and/or AE instruction    Plan  CCDT to include manual lymphatic drainage, skin care, comrpession wraps and custom garments once swelling is reduced, and therapeutic exercise for lymphatic pumping . Pt will be instructed in LE self care at all sessions in effort to limit LE progression and to facilitate retention of clinical gains over time.    Consulted and Agree with Plan of Care  Patient       Patient will benefit from skilled therapeutic intervention in order to improve the following deficits and impairments:   Body Structure / Function / Physical Skills: ADL, Decreased knowledge of  precautions, Edema, Obesity, ROM, Skin integrity, Scar mobility, Mobility, IADL, Gait   Psychosocial Skills: Coping Strategies, Habits, Environmental  Adaptations   Visit Diagnosis: 1. Lymphedema, not elsewhere classified       Problem List Patient Active Problem List   Diagnosis Date Noted  . Routine general medical examination at a health care facility 12/11/2016  . Primary osteoarthritis of left hip 05/01/2016  . Autoimmune disease ([place]) 11/29/2015  . High risk medication use 11/29/2015  . Vitamin D deficiency 11/29/2015  . Macular degeneration 11/29/2015  . Bilateral lower extremity edema 10/26/2015  . H/O total knee replacement, bilateral 12/09/2013  . Breast mass, left 01/18/2011  . Obesity 12/08/2009  . Venous (peripheral) insufficiency 07/28/2009  . PAIN IN JOINT, ANKLE AND FOOT 01/12/2008  . Obstructive sleep apnea 08/29/2007  . GAIT DISTURBANCE 08/14/2007  . Depression 08/26/2006  . Essential hypertension 08/26/2006  . Primary osteoarthritis of both hands 08/26/2006  . Urinary incontinence 08/26/2006    Andrey Spearman, MS, OTR/L, [facility] 08/28/18 2:33 PM   [place] MAIN [facility] SERVICES [address] [place], [place], [address] Phone: [phone]   Fax:  [phone]  Name: KONA YUSUF MRN: [id number] Date of Birth: [birth year]

## 2018-08-29 DIAGNOSIS — I890 Lymphedema, not elsewhere classified: Secondary | ICD-10-CM | POA: Diagnosis not present

## 2018-09-01 ENCOUNTER — Encounter: Payer: Medicare HMO | Admitting: Occupational Therapy

## 2018-09-02 ENCOUNTER — Ambulatory Visit: Payer: Medicare HMO | Admitting: Occupational Therapy

## 2018-09-02 ENCOUNTER — Other Ambulatory Visit: Payer: Self-pay

## 2018-09-02 DIAGNOSIS — I890 Lymphedema, not elsewhere classified: Secondary | ICD-10-CM

## 2018-09-02 NOTE — Therapy (Signed)
[place] MAIN [facility] SERVICES [address] [place], [place], [address] Phone: [phone]   Fax:  [phone]  Occupational Therapy Treatment  Patient Details  Name: Taylor Mccall MRN: [id number] Date of Birth: 1945/04/09 Referring Provider (OT): Dorothyann Peng, NP   Encounter Date: 09/02/2018  OT End of Session - 09/02/18 1135    Visit Number  18    Number of Visits  36    Date for OT Re-Evaluation  09/24/18    OT Start Time  1015    OT Stop Time  1130    OT Time Calculation (min)  75 min    Activity Tolerance  Patient tolerated treatment well;No increased pain    Behavior During Therapy  WFL for tasks assessed/performed       Past Medical History:  Diagnosis Date  . Anxiety   . Blood transfusion without reported diagnosis   . Breast mass, left   . Cataract   . Depression   . Gait disturbance   . GERD (gastroesophageal reflux disease)   . Hypertension   . Lymphedema of both lower extremities    Seeing OT at [place] to Left Lower leg  . Obesity   . Osteoarthritis   . Rheumatoid arthritis(714.0)   . Sleep apnea    cpap  . Urinary incontinence   . Venous insufficiency     Past Surgical History:  Procedure Laterality Date  . ABDOMINAL HYSTERECTOMY    . BLADDER SURGERY     sling  . BLADDER SUSPENSION  2009  . BREAST REDUCTION SURGERY    . COLONOSCOPY    . ESOPHAGOGASTRODUODENOSCOPY    . EYE SURGERY    . REPLACEMENT TOTAL KNEE Left   . TOE SURGERY Left    Left great toe  . TOTAL HIP ARTHROPLASTY    . TOTAL KNEE ARTHROPLASTY Right 12/09/2013   Procedure: Right Total Knee Arthroplasty;  Surgeon: Newt Minion, MD;  Location: [place];  Service: Orthopedics;  Laterality: Right;  Marland Kitchen VESICOVAGINAL FISTULA CLOSURE W/ TAH      There were no vitals filed for this visit.  Subjective Assessment - 09/02/18 1025    Subjective   Ms Salomone presents for OT visit 18//36 to address BLE LE. Pt presents  with compression wraps in place. Pt reports DME vendor called to confirm compression garment order. Pt denied leg pain today.    Patient is accompanied by:  Family member    Pertinent History  anxiety, depression gait disturbance, impaired transfers, HTN, OA, RA, OSA incontinence and CVI    Limitations  chronic leg pain and swelling, difficulty walking, decreased standing and walking tolwerance    Special Tests  positive Stemmer sign bilaterally, L>R    Pain Onset  More than a month ago          LYMPHEDEMA/ONCOLOGY QUESTIONNAIRE - 09/02/18 1139      Left Lower Extremity Lymphedema   Other  LLE A-D ( ankle to tibial tuberosity) limb volume measures 4861.27  ml.    Other  LLE  A-D limb volume is DEreased by an additional 14.65% since last measured ~ 1 month ago on 08/05/18. LLE A-D volume reduction achieved since initiall measured on 07/03/18 measures 22.06%. This value meets the 10% reduction  goal and exceeds i. Excellent progress towards LLE limb volume reduction goal below the k nee.              OT Treatments/Exercises (OP) -  09/02/18 0001      ADLs   ADL Education Given  Yes      Manual Therapy   Manual Therapy  Edema management;Manual Lymphatic Drainage (MLD)    Manual therapy comments  LLE A-D comparative limb volumetrics    Edema Management  skin care to LLE using  low ph castor oil throughout MLD to increase hydration and skin  mobility    Manual Lymphatic Drainage (MLD)  MLD to LLE from proximal to distal utilizing short neck sequence bilaterally, deep abdominal breathing by patient, and functional inguinal pathways. No difficulty tolerating manual therapy observed.    Compression Bandaging  Gradient compression wraps x 3 over Rosidal foam from base of toes to below knee over cotton stockinette. Will modify PRN                  OT Long Term Goals - 09/02/18 1144      OT LONG TERM GOAL #1   Title  Pt will be able to apply  knee length, multi-layer, short  stretch compression wraps (one leg at a time) using correct gradient techniques with modified independence (extra time)  to achieve optimal limb volume reduction, and to return affected limb , as closely as possible, to premorbid size and shape.    Baseline  dependent 08/05/18 : Achieved on time    Time  4    Period  Days    Status  Achieved      OT LONG TERM GOAL #2   Title  Pt to achieve no less than 10% limb volume reductions below knees bilaterally during Intensive Phase CDT to improve AROM for ADLs, safe ambulation and transfers, to reduce infection risk, and to limit LE progression.    Baseline  dependent 08/05/18: Excellent  LLE progress with 8% volume reduction measured below the knee today. 09/03/18: achieved and exceeded with 22.06% below the knee (A-D)    Time  12    Period  Weeks    Status  Partially Met      OT LONG TERM GOAL #3   Title  Pt will achieve  100% compliance with daily LE self-care program with min caregiver assistance throughout Intensive Phase CDT, including daily  skin care, lymphatic pumping therex, simple self-MLD and prescribe compression to ensure optimal limb volume reduction, to limit infection risk and to limit LE progression.    Baseline  dependent 08/05/18 : Achieved on time    Time  12    Period  Weeks    Status  Achieved      OT LONG TERM GOAL #4   Title  Pt will demonstrate modified independence using  assistive devices and extra time  during LE self-care training to don and doff properly fitting, custom compression garments/devices( on order from preferred DME vendor)for optimal LE self-management over time.     Baseline  dependent    Time  12    Period  Weeks    Status  On-going            Plan - 09/02/18 1139    Clinical Impression Statement  LLE A-D ( ankle to tibial tuberosity) limb volume measures 4861.27  ml. LLE  A-D limb volume is DEreased by an additional 14.65% since last measured ~ 1 month ago on 08/05/18. LLE A-D volume reduction  achieved since initiall measured on 07/03/18 measures 22.06%. This value meets the 10% reduction  goal and exceeds i. Excellent progress towards LLE limb volume reduction goal  below the knee. Pt's pain level has also reduced in LLE by report. MLD and compression therapy well tolerated . Cont as per POC.    OT Occupational Profile and History  Comprehensive Assessment- Review of records and extensive additional review of physical, cognitive, psychosocial history related to current functional performance    Occupational performance deficits (Please refer to evaluation for details):  ADL's;Work;Other;IADL's;Rest and Sleep;Leisure;Social Participation   functional ambulation and transfers, body image, role  performance   Body Structure / Function / Physical Skills  ADL;Decreased knowledge of precautions;Edema;Obesity;ROM;Skin integrity;Scar mobility;Mobility;IADL;Gait    Psychosocial Skills  Coping Strategies;Habits;Environmental  Adaptations    Rehab Potential  Good    Clinical Decision Making  Several treatment options, min-mod task modification necessary    Comorbidities Affecting Occupational Performance:  Presence of comorbidities impacting occupational performance    Comorbidities impacting occupational performance description:  see SUBJECTIVE for pertinent comorbidities    Modification or Assistance to Complete Evaluation   Min-Moderate modification of tasks or assist with assess necessary to complete eval    OT Frequency  3x / week    OT Duration  12 weeks    OT Treatment/Interventions  Self-care/ADL training;Therapeutic exercise;Energy conservation;Manual lymph drainage;Scar mobilization;Therapeutic activities;Patient/family education;Compression bandaging;Manual Therapy;DME and/or AE instruction    Plan  CCDT to include manual lymphatic drainage, skin care, comrpession wraps and custom garments once swelling is reduced, and therapeutic exercise for lymphatic pumping . Pt will be instructed in LE  self care at all sessions in effort to limit LE progression and to facilitate retention of clinical gains over time.    Consulted and Agree with Plan of Care  Patient       Patient will benefit from skilled therapeutic intervention in order to improve the following deficits and impairments:   Body Structure / Function / Physical Skills: ADL, Decreased knowledge of precautions, Edema, Obesity, ROM, Skin integrity, Scar mobility, Mobility, IADL, Gait   Psychosocial Skills: Coping Strategies, Habits, Environmental  Adaptations   Visit Diagnosis: 1. Lymphedema, not elsewhere classified       Problem List Patient Active Problem List   Diagnosis Date Noted  . Routine general medical examination at a health care facility 12/11/2016  . Primary osteoarthritis of left hip 05/01/2016  . Autoimmune disease ([place]) 11/29/2015  . High risk medication use 11/29/2015  . Vitamin D deficiency 11/29/2015  . Macular degeneration 11/29/2015  . Bilateral lower extremity edema 10/26/2015  . H/O total knee replacement, bilateral 12/09/2013  . Breast mass, left 01/18/2011  . Obesity 12/08/2009  . Venous (peripheral) insufficiency 07/28/2009  . PAIN IN JOINT, ANKLE AND FOOT 01/12/2008  . Obstructive sleep apnea 08/29/2007  . GAIT DISTURBANCE 08/14/2007  . Depression 08/26/2006  . Essential hypertension 08/26/2006  . Primary osteoarthritis of both hands 08/26/2006  . Urinary incontinence 08/26/2006    Andrey Spearman, MS, OTR/L, [facility] 09/02/18 11:47 AM  [place] MAIN [facility] SERVICES [address] [place], [place], [address] Phone: [phone]   Fax:  [phone]  Name: DAMIRA KEM MRN: [id number] Date of Birth: Jan 20, 1946

## 2018-09-03 ENCOUNTER — Encounter: Payer: Medicare HMO | Admitting: Occupational Therapy

## 2018-09-04 ENCOUNTER — Encounter: Payer: Self-pay | Admitting: Physician Assistant

## 2018-09-04 ENCOUNTER — Ambulatory Visit (INDEPENDENT_AMBULATORY_CARE_PROVIDER_SITE_OTHER): Payer: Medicare HMO | Admitting: Physician Assistant

## 2018-09-04 ENCOUNTER — Other Ambulatory Visit: Payer: Self-pay

## 2018-09-04 ENCOUNTER — Ambulatory Visit: Payer: Medicare HMO | Admitting: Occupational Therapy

## 2018-09-04 VITALS — BP 132/83 | HR 83 | Resp 14 | Ht 70.0 in | Wt 274.0 lb

## 2018-09-04 DIAGNOSIS — M19041 Primary osteoarthritis, right hand: Secondary | ICD-10-CM

## 2018-09-04 DIAGNOSIS — G8929 Other chronic pain: Secondary | ICD-10-CM

## 2018-09-04 DIAGNOSIS — I890 Lymphedema, not elsewhere classified: Secondary | ICD-10-CM

## 2018-09-04 DIAGNOSIS — E559 Vitamin D deficiency, unspecified: Secondary | ICD-10-CM | POA: Diagnosis not present

## 2018-09-04 DIAGNOSIS — Z79899 Other long term (current) drug therapy: Secondary | ICD-10-CM

## 2018-09-04 DIAGNOSIS — Z8669 Personal history of other diseases of the nervous system and sense organs: Secondary | ICD-10-CM | POA: Diagnosis not present

## 2018-09-04 DIAGNOSIS — R69 Illness, unspecified: Secondary | ICD-10-CM | POA: Diagnosis not present

## 2018-09-04 DIAGNOSIS — M1612 Unilateral primary osteoarthritis, left hip: Secondary | ICD-10-CM | POA: Diagnosis not present

## 2018-09-04 DIAGNOSIS — Z96653 Presence of artificial knee joint, bilateral: Secondary | ICD-10-CM | POA: Diagnosis not present

## 2018-09-04 DIAGNOSIS — M19042 Primary osteoarthritis, left hand: Secondary | ICD-10-CM

## 2018-09-04 DIAGNOSIS — Z13820 Encounter for screening for osteoporosis: Secondary | ICD-10-CM

## 2018-09-04 DIAGNOSIS — M25511 Pain in right shoulder: Secondary | ICD-10-CM | POA: Diagnosis not present

## 2018-09-04 DIAGNOSIS — R600 Localized edema: Secondary | ICD-10-CM

## 2018-09-04 DIAGNOSIS — M359 Systemic involvement of connective tissue, unspecified: Secondary | ICD-10-CM

## 2018-09-04 DIAGNOSIS — Z8659 Personal history of other mental and behavioral disorders: Secondary | ICD-10-CM

## 2018-09-04 MED ORDER — TRIAMCINOLONE ACETONIDE 40 MG/ML IJ SUSP
40.0000 mg | INTRAMUSCULAR | Status: AC | PRN
  Administered 2018-09-04: 40 mg via INTRA_ARTICULAR

## 2018-09-04 MED ORDER — LIDOCAINE HCL 1 % IJ SOLN
1.5000 mL | INTRAMUSCULAR | Status: AC | PRN
  Administered 2018-09-04: 1.5 mL

## 2018-09-04 NOTE — Therapy (Signed)
[place] MAIN [facility] SERVICES [address] [place], [place], [address] Phone: [phone]   Fax:  [phone]  Occupational Therapy Treatment  Patient Details  Name: Taylor Mccall MRN: [id number] Date of Birth: 1945/11/08 Referring Provider (OT): Dorothyann Peng, NP   Encounter Date: 09/04/2018  OT End of Session - 09/04/18 1659    Visit Number  19    Number of Visits  36    Date for OT Re-Evaluation  09/24/18    OT Start Time  0220    OT Stop Time  0320    OT Time Calculation (min)  60 min    Activity Tolerance  Patient tolerated treatment well;No increased pain    Behavior During Therapy  WFL for tasks assessed/performed       Past Medical History:  Diagnosis Date  . Anxiety   . Blood transfusion without reported diagnosis   . Breast mass, left   . Cataract   . Depression   . Gait disturbance   . GERD (gastroesophageal reflux disease)   . Hypertension   . Lymphedema of both lower extremities    Seeing OT at [facility] to Left Lower leg  . Obesity   . Osteoarthritis   . Rheumatoid arthritis(714.0)   . Sleep apnea    cpap  . Urinary incontinence   . Venous insufficiency     Past Surgical History:  Procedure Laterality Date  . ABDOMINAL HYSTERECTOMY    . BLADDER SURGERY     sling  . BLADDER SUSPENSION  2009  . BREAST REDUCTION SURGERY    . COLONOSCOPY    . ESOPHAGOGASTRODUODENOSCOPY    . EYE SURGERY    . REPLACEMENT TOTAL KNEE Left   . TOE SURGERY Left    Left great toe  . TOTAL HIP ARTHROPLASTY    . TOTAL KNEE ARTHROPLASTY Right 12/09/2013   Procedure: Right Total Knee Arthroplasty;  Surgeon: Newt Minion, MD;  Location: [place];  Service: Orthopedics;  Laterality: Right;  Marland Kitchen VESICOVAGINAL FISTULA CLOSURE W/ TAH      There were no vitals filed for this visit.  Subjective Assessment - 09/04/18 1656    Subjective   Taylor Mccall presents for OT visit 19//36 to address BLE LE. She brings  new custom LLE compression thigh high and knee length garments to clinic for fitting.    Patient is accompanied by:  Family member    Pertinent History  anxiety, depression gait disturbance, impaired transfers, HTN, OA, RA, OSA incontinence and CVI    Limitations  chronic leg pain and swelling, difficulty walking, decreased standing and walking tolwerance    Special Tests  positive Stemmer sign bilaterally, L>R    Pain Onset  More than a month ago                   OT Treatments/Exercises (OP) - 09/04/18 0001      ADLs   ADL Education Given  Yes      Manual Therapy   Manual Therapy  Edema management    Manual therapy comments  Fitted LLE custom  compression thigh High    Manual Lymphatic Drainage (MLD)  Commenced MLD to RLE from proximal to distal utilizing short neck sequence bilaterally, deep abdominal breathing by patient, and functional inguinal pathways. No difficulty tolerating manual therapy observed.    Compression Bandaging  Commenced gradient compression wraps to RLE. Gradient compression wraps x 3 over Rosidal foam from base  of toes to below knee over cotton stockinette. Will modify PRN             OT Education - 09/04/18 1658    Education Details  Skilled LE self care training for wear and care regimes for custom elastic compression garments for full-time daily use issued and fit today. Provided training for donning and doffing garments using assistive devices (friction gloves and Tyvek sock).    Person(s) Educated  Patient;Child(ren)    Methods  Explanation;Demonstration;Tactile cues;Verbal cues    Comprehension  Verbalized understanding;Returned demonstration;Verbal cues required;Tactile cues required;Need further instruction          OT Long Term Goals - 09/02/18 1144      OT LONG TERM GOAL #1   Title  Pt will be able to apply  knee length, multi-layer, short stretch compression wraps (one leg at a time) using correct gradient techniques with  modified independence (extra time)  to achieve optimal limb volume reduction, and to return affected limb , as closely as possible, to premorbid size and shape.    Baseline  dependent 08/05/18 : Achieved on time    Time  4    Period  Days    Status  Achieved      OT LONG TERM GOAL #2   Title  Pt to achieve no less than 10% limb volume reductions below knees bilaterally during Intensive Phase CDT to improve AROM for ADLs, safe ambulation and transfers, to reduce infection risk, and to limit LE progression.    Baseline  dependent 08/05/18: Excellent  LLE progress with 8% volume reduction measured below the knee today. 09/03/18: achieved and exceeded with 22.06% below the knee (A-D)    Time  12    Period  Weeks    Status  Partially Met      OT LONG TERM GOAL #3   Title  Pt will achieve  100% compliance with daily LE self-care program with min caregiver assistance throughout Intensive Phase CDT, including daily  skin care, lymphatic pumping therex, simple self-MLD and prescribe compression to ensure optimal limb volume reduction, to limit infection risk and to limit LE progression.    Baseline  dependent 08/05/18 : Achieved on time    Time  12    Period  Weeks    Status  Achieved      OT LONG TERM GOAL #4   Title  Pt will demonstrate modified independence using  assistive devices and extra time  during LE self-care training to don and doff properly fitting, custom compression garments/devices( on order from preferred DME vendor)for optimal LE self-management over time.     Baseline  dependent    Time  12    Period  Weeks    Status  On-going            Plan - 09/04/18 1700    Clinical Impression Statement  Fitted LLE custom [employer] SOFT ccl 3 thigh legth compression garment. Initialy garment worn into clinic was bunching at both knee and ankle. After repositioningh garment fitt appears better length -wise, but functional use over the weekend will better determine this aspect. In  addition to suspection garment may be slightly too long, I also suspect it is a little too large   circumferentially as Pt has had additional 14% limb volume reduction in the past 2 weeks. Plan to remease next week after Pt washes and wears before completng final assessment. Cont CDT t9o RLE and fit garments/ devices ASAP.  OT Occupational Profile and History  Comprehensive Assessment- Review of records and extensive additional review of physical, cognitive, psychosocial history related to current functional performance    Occupational performance deficits (Please refer to evaluation for details):  ADL's;Work;Other;IADL's;Rest and Sleep;Leisure;Social Participation   functional ambulation and transfers, body image, role  performance   Body Structure / Function / Physical Skills  ADL;Decreased knowledge of precautions;Edema;Obesity;ROM;Skin integrity;Scar mobility;Mobility;IADL;Gait    Psychosocial Skills  Coping Strategies;Habits;Environmental  Adaptations    Rehab Potential  Good    Clinical Decision Making  Several treatment options, min-mod task modification necessary    Comorbidities Affecting Occupational Performance:  Presence of comorbidities impacting occupational performance    Comorbidities impacting occupational performance description:  see SUBJECTIVE for pertinent comorbidities    Modification or Assistance to Complete Evaluation   Min-Moderate modification of tasks or assist with assess necessary to complete eval    OT Frequency  3x / week    OT Duration  12 weeks    OT Treatment/Interventions  Self-care/ADL training;Therapeutic exercise;Energy conservation;Manual lymph drainage;Scar mobilization;Therapeutic activities;Patient/family education;Compression bandaging;Manual Therapy;DME and/or AE instruction    Plan  CCDT to include manual lymphatic drainage, skin care, comrpession wraps and custom garments once swelling is reduced, and therapeutic exercise for lymphatic pumping . Pt will  be instructed in LE self care at all sessions in effort to limit LE progression and to facilitate retention of clinical gains over time.    Consulted and Agree with Plan of Care  Patient       Patient will benefit from skilled therapeutic intervention in order to improve the following deficits and impairments:   Body Structure / Function / Physical Skills: ADL, Decreased knowledge of precautions, Edema, Obesity, ROM, Skin integrity, Scar mobility, Mobility, IADL, Gait   Psychosocial Skills: Coping Strategies, Habits, Environmental  Adaptations   Visit Diagnosis: 1. Lymphedema, not elsewhere classified       Problem List Patient Active Problem List   Diagnosis Date Noted  . Routine general medical examination at a health care facility 12/11/2016  . Primary osteoarthritis of left hip 05/01/2016  . Autoimmune disease ([place]) 11/29/2015  . High risk medication use 11/29/2015  . Vitamin D deficiency 11/29/2015  . Macular degeneration 11/29/2015  . Bilateral lower extremity edema 10/26/2015  . H/O total knee replacement, bilateral 12/09/2013  . Breast mass, left 01/18/2011  . Obesity 12/08/2009  . Venous (peripheral) insufficiency 07/28/2009  . PAIN IN JOINT, ANKLE AND FOOT 01/12/2008  . Obstructive sleep apnea 08/29/2007  . GAIT DISTURBANCE 08/14/2007  . Depression 08/26/2006  . Essential hypertension 08/26/2006  . Primary osteoarthritis of both hands 08/26/2006  . Urinary incontinence 08/26/2006    Andrey Spearman, Taylor, OTR/L, [facility] 09/04/18 5:04 PM  [place] MAIN [facility] SERVICES [address] [place], [place], [address] Phone: [phone]   Fax:  [phone]  Name: EREKA BRAU MRN: [id number] Date of Birth: 03/18/1945

## 2018-09-05 NOTE — Progress Notes (Signed)
Vitamin D is low.  Please notify patient and send in vitamin D 50,000 units by mouth once weekly for 3 months.  We will recheck vitamin D in 3 months.   CBC and CMP WNL.  Sed rate WNL.  UA normal.

## 2018-09-05 NOTE — Progress Notes (Signed)
DsDNA is 2.

## 2018-09-06 LAB — CBC WITH DIFFERENTIAL/PLATELET
Absolute Monocytes: 371 cells/uL (ref 200–950)
Basophils Absolute: 70 cells/uL (ref 0–200)
Basophils Relative: 1.2 %
Eosinophils Absolute: 284 cells/uL (ref 15–500)
Eosinophils Relative: 4.9 %
HCT: 39 % (ref 35.0–45.0)
Hemoglobin: 12.9 g/dL (ref 11.7–15.5)
Lymphs Abs: 1328 cells/uL (ref 850–3900)
MCH: 29.1 pg (ref 27.0–33.0)
MCHC: 33.1 g/dL (ref 32.0–36.0)
MCV: 87.8 fL (ref 80.0–100.0)
MPV: 10.6 fL (ref 7.5–12.5)
Monocytes Relative: 6.4 %
Neutro Abs: 3747 cells/uL (ref 1500–7800)
Neutrophils Relative %: 64.6 %
Platelets: 303 10*3/uL (ref 140–400)
RBC: 4.44 10*6/uL (ref 3.80–5.10)
RDW: 11.9 % (ref 11.0–15.0)
Total Lymphocyte: 22.9 %
WBC: 5.8 10*3/uL (ref 3.8–10.8)

## 2018-09-06 LAB — COMPLETE METABOLIC PANEL WITH GFR
AG Ratio: 1.4 (calc) (ref 1.0–2.5)
ALT: 9 U/L (ref 6–29)
AST: 14 U/L (ref 10–35)
Albumin: 4.2 g/dL (ref 3.6–5.1)
Alkaline phosphatase (APISO): 106 U/L (ref 37–153)
BUN: 11 mg/dL (ref 7–25)
CO2: 29 mmol/L (ref 20–32)
Calcium: 9.8 mg/dL (ref 8.6–10.4)
Chloride: 107 mmol/L (ref 98–110)
Creat: 0.84 mg/dL (ref 0.60–0.93)
GFR, Est African American: 79 mL/min/{1.73_m2} (ref >=60)
GFR, Est Non African American: 68 mL/min/{1.73_m2} (ref >=60)
Globulin: 3 g/dL (calc) (ref 1.9–3.7)
Glucose, Bld: 94 mg/dL (ref 65–99)
Potassium: 3.9 mmol/L (ref 3.5–5.3)
Sodium: 143 mmol/L (ref 135–146)
Total Bilirubin: 0.7 mg/dL (ref 0.2–1.2)
Total Protein: 7.2 g/dL (ref 6.1–8.1)

## 2018-09-06 LAB — URINALYSIS, ROUTINE W REFLEX MICROSCOPIC
Bilirubin Urine: NEGATIVE
Glucose, UA: NEGATIVE
Hgb urine dipstick: NEGATIVE
Ketones, ur: NEGATIVE
Leukocytes,Ua: NEGATIVE
Nitrite: NEGATIVE
Protein, ur: NEGATIVE
Specific Gravity, Urine: 1.025 (ref 1.001–1.03)
pH: <=5 (ref 5.0–8.0)

## 2018-09-06 LAB — SEDIMENTATION RATE: Sed Rate: 9 mm/h (ref 0–30)

## 2018-09-06 LAB — C3 AND C4
C3 Complement: 152 mg/dL (ref 83–193)
C4 Complement: 42 mg/dL (ref 15–57)

## 2018-09-06 LAB — VITAMIN D 25 HYDROXY (VIT D DEFICIENCY, FRACTURES): Vit D, 25-Hydroxy: 25 ng/mL — ABNORMAL LOW (ref 30–100)

## 2018-09-06 LAB — ANTI-DNA ANTIBODY, DOUBLE-STRANDED: ds DNA Ab: 2 IU/mL

## 2018-09-08 ENCOUNTER — Encounter: Payer: Medicare HMO | Admitting: Occupational Therapy

## 2018-09-08 ENCOUNTER — Other Ambulatory Visit: Payer: Self-pay

## 2018-09-08 DIAGNOSIS — E559 Vitamin D deficiency, unspecified: Secondary | ICD-10-CM

## 2018-09-08 MED ORDER — VITAMIN D (ERGOCALCIFEROL) 1.25 MG (50000 UNIT) PO CAPS: 50000.0000 [IU] | ORAL_CAPSULE | ORAL | 0 refills | Status: DC

## 2018-09-08 NOTE — Progress Notes (Signed)
Complements WNL

## 2018-09-09 ENCOUNTER — Ambulatory Visit: Payer: Medicare HMO | Attending: Adult Health | Admitting: Occupational Therapy

## 2018-09-09 ENCOUNTER — Other Ambulatory Visit: Payer: Self-pay

## 2018-09-09 DIAGNOSIS — I890 Lymphedema, not elsewhere classified: Secondary | ICD-10-CM | POA: Diagnosis not present

## 2018-09-09 NOTE — Therapy (Signed)
Taylor Mccall [facility] SERVICES [address] [place], [place], [address] Phone: [phone]   Fax:  [phone]  Occupational Therapy Treatment Note and Progress Report  Patient Details  Name: Taylor Mccall MRN: [id number] Date of Birth: 03/19/1945 Referring Provider (OT): Dorothyann Peng, NP   Encounter Date: 09/09/2018  OT End of Session - 09/09/18 1523    Visit Number  20    Number of Visits  36    Date for OT Re-Evaluation  09/24/18    OT Start Time  0215    OT Stop Time  0315    OT Time Calculation (min)  60 min    Activity Tolerance  Patient tolerated treatment well;No increased pain    Behavior During Therapy  WFL for tasks assessed/performed       Past Medical History:  Diagnosis Date  . Anxiety   . Blood transfusion without reported diagnosis   . Breast mass, left   . Cataract   . Depression   . Gait disturbance   . GERD (gastroesophageal reflux disease)   . Hypertension   . Lymphedema of both lower extremities    Seeing OT at [place] to Left Lower leg  . Obesity   . Osteoarthritis   . Rheumatoid arthritis(714.0)   . Sleep apnea    cpap  . Urinary incontinence   . Venous insufficiency     Past Surgical History:  Procedure Laterality Date  . ABDOMINAL HYSTERECTOMY    . BLADDER SURGERY     sling  . BLADDER SUSPENSION  2009  . BREAST REDUCTION SURGERY    . COLONOSCOPY    . ESOPHAGOGASTRODUODENOSCOPY    . EYE SURGERY    . REPLACEMENT TOTAL KNEE Left   . TOE SURGERY Left    Left great toe  . TOTAL HIP ARTHROPLASTY    . TOTAL KNEE ARTHROPLASTY Right 12/09/2013   Procedure: Right Total Knee Arthroplasty;  Surgeon: Newt Minion, MD;  Location: [place];  Service: Orthopedics;  Laterality: Right;  Marland Kitchen VESICOVAGINAL FISTULA CLOSURE W/ TAH      There were no vitals filed for this visit.  Subjective Assessment - 09/09/18 1521    Subjective   Taylor Mccall presents for OT visit 20//36 to  address BLE LE. Shewears LLE custom thigh high compression stocking to clinic and states, "It did roll down a little bit and it pinched my leg."    Patient is accompanied by:  Family member    Pertinent History  anxiety, depression gait disturbance, impaired transfers, HTN, OA, RA, OSA incontinence and CVI    Limitations  chronic leg pain and swelling, difficulty walking, decreased standing and walking tolwerance    Special Tests  positive Stemmer sign bilaterally, L>R    Pain Onset  More than a month ago                   OT Treatments/Exercises (OP) - 09/09/18 0001      ADLs   ADL Education Given  Yes      Manual Therapy   Manual Therapy  Edema management;Compression Bandaging    Manual therapy comments  repeated LLE anatomical measurements to adjust compression garment fit    Compression Bandaging  kneee length  gradient compression wraps to RLE. Gradient compression wraps x 3 over Rosidal foam from base of toes to below knee over cotton stockinette. Will modify PRN  OT Education - 09/09/18 1523    Education Details  Skilled LE self care training for wear and care regimes for custom elastic compression garments for full-time daily use issued and fit today. Provided training for donning and doffing garments using assistive devices (friction gloves and Tyvek sock).    Person(s) Educated  Patient;Child(ren)    Methods  Explanation;Demonstration;Tactile cues;Verbal cues    Comprehension  Verbalized understanding;Returned demonstration;Verbal cues required;Tactile cues required;Need further instruction          OT Long Term Goals - 09/02/18 1144      OT LONG TERM GOAL #1   Title  Pt will be able to apply  knee length, multi-layer, short stretch compression wraps (one leg at a time) using correct gradient techniques with modified independence (extra time)  to achieve optimal limb volume reduction, and to return affected limb , as closely as possible, to  premorbid size and shape.    Baseline  dependent 08/05/18 : Achieved on time    Time  4    Period  Days    Status  Achieved      OT LONG TERM GOAL #2   Title  Pt to achieve no less than 10% limb volume reductions below knees bilaterally during Intensive Phase CDT to improve AROM for ADLs, safe ambulation and transfers, to reduce infection risk, and to limit LE progression.    Baseline  dependent 08/05/18: Excellent  LLE progress with 8% volume reduction measured below the knee today. 09/03/18: achieved and exceeded with 22.06% below the knee (A-D)    Time  12    Period  Weeks    Status  Partially Met      OT LONG TERM GOAL #3   Title  Pt will achieve  100% compliance with daily LE self-care program with min caregiver assistance throughout Intensive Phase CDT, including daily  skin care, lymphatic pumping therex, simple self-MLD and prescribe compression to ensure optimal limb volume reduction, to limit infection risk and to limit LE progression.    Baseline  dependent 08/05/18 : Achieved on time    Time  12    Period  Weeks    Status  Achieved      OT LONG TERM GOAL #4   Title  Pt will demonstrate modified independence using  assistive devices and extra time  during LE self-care training to don and doff properly fitting, custom compression garments/devices( on order from preferred DME vendor)for optimal LE self-management over time.     Baseline  dependent    Time  12    Period  Weeks    Status  On-going            Plan - 09/09/18 1527    Clinical Impression Statement  See Long Term Goals revised 7/28 for excellent progress towards all OT goals for LE care.  Completed repeat LLE anatomical measurements to improve fit and function of garment. Garment worn all weekend with top edge rolling down and garment bunching at ankle causing increased tourniquett effect at akle, resultiong in increased foort and ankle swelling. Shorten4d length at G by 1.5 cm and ajusted lengths at F and C.  Patient able to dont existing garment using assistive device in clinic after completed skilled teaching. Pt instructed to wash and wear custom knee high in prep for fitting at next   visit. Cont 2 x weekly until RLE compression garm,ents are fit.    OT Occupational Profile and History  Comprehensive Assessment- Review  of records and extensive additional review of physical, cognitive, psychosocial history related to current functional performance    Occupational performance deficits (Please refer to evaluation for details):  ADL's;Work;Other;IADL's;Rest and Sleep;Leisure;Social Participation   functional ambulation and transfers, body image, role  performance   Body Structure / Function / Physical Skills  ADL;Decreased knowledge of precautions;Edema;Obesity;ROM;Skin integrity;Scar mobility;Mobility;IADL;Gait    Psychosocial Skills  Coping Strategies;Habits;Environmental  Adaptations    Rehab Potential  Good    Clinical Decision Making  Several treatment options, min-mod task modification necessary    Comorbidities Affecting Occupational Performance:  Presence of comorbidities impacting occupational performance    Comorbidities impacting occupational performance description:  see SUBJECTIVE for pertinent comorbidities    Modification or Assistance to Complete Evaluation   Min-Moderate modification of tasks or assist with assess necessary to complete eval    OT Frequency  3x / week    OT Duration  12 weeks    OT Treatment/Interventions  Self-care/ADL training;Therapeutic exercise;Energy conservation;Manual lymph drainage;Scar mobilization;Therapeutic activities;Patient/family education;Compression bandaging;Manual Therapy;DME and/or AE instruction    Plan  CCDT to include manual lymphatic drainage, skin care, comrpession wraps and custom garments once swelling is reduced, and therapeutic exercise for lymphatic pumping . Pt will be instructed in LE self care at all sessions in effort to limit LE  progression and to facilitate retention of clinical gains over time.    Consulted and Agree with Plan of Care  Patient       Patient will benefit from skilled therapeutic intervention in order to improve the following deficits and impairments:   Body Structure / Function / Physical Skills: ADL, Decreased knowledge of precautions, Edema, Obesity, ROM, Skin integrity, Scar mobility, Mobility, IADL, Gait   Psychosocial Skills: Coping Strategies, Habits, Environmental  Adaptations   Visit Diagnosis: 1. Lymphedema, not elsewhere classified       Problem List Patient Active Problem List   Diagnosis Date Noted  . Routine general medical examination at a health care facility 12/11/2016  . Primary osteoarthritis of left hip 05/01/2016  . Autoimmune disease ([place]) 11/29/2015  . High risk medication use 11/29/2015  . Vitamin D deficiency 11/29/2015  . Macular degeneration 11/29/2015  . Bilateral lower extremity edema 10/26/2015  . H/O total knee replacement, bilateral 12/09/2013  . Breast mass, left 01/18/2011  . Obesity 12/08/2009  . Venous (peripheral) insufficiency 07/28/2009  . PAIN IN JOINT, ANKLE AND FOOT 01/12/2008  . Obstructive sleep apnea 08/29/2007  . GAIT DISTURBANCE 08/14/2007  . Depression 08/26/2006  . Essential hypertension 08/26/2006  . Primary osteoarthritis of both hands 08/26/2006  . Urinary incontinence 08/26/2006    Ansel Bong 09/09/2018, 3:39 PM  [facility] SERVICES [address] [place], [place], [address] Phone: [phone]   Fax:  [phone]  Name: Taylor Mccall MRN: [id number] Date of Birth: 12/19/1945

## 2018-09-09 NOTE — Therapy (Signed)
[place] MAIN [facility] SERVICES [address] [place], [place], [address] Phone: [phone]   Fax:  [phone]  Occupational Therapy Treatment  Patient Details  Name: Taylor Mccall MRN: [id number] Date of Birth: 06-Feb-1946 Referring Provider (OT): Dorothyann Peng, NP   Encounter Date: 09/09/2018  OT End of Session - 09/09/18 1523    Visit Number  20    Number of Visits  36    Date for OT Re-Evaluation  09/24/18    OT Start Time  0215    OT Stop Time  0315    OT Time Calculation (min)  60 min    Activity Tolerance  Patient tolerated treatment well;No increased pain    Behavior During Therapy  WFL for tasks assessed/performed       Past Medical History:  Diagnosis Date  . Anxiety   . Blood transfusion without reported diagnosis   . Breast mass, left   . Cataract   . Depression   . Gait disturbance   . GERD (gastroesophageal reflux disease)   . Hypertension   . Lymphedema of both lower extremities    Seeing OT at [place] to Left Lower leg  . Obesity   . Osteoarthritis   . Rheumatoid arthritis(714.0)   . Sleep apnea    cpap  . Urinary incontinence   . Venous insufficiency     Past Surgical History:  Procedure Laterality Date  . ABDOMINAL HYSTERECTOMY    . BLADDER SURGERY     sling  . BLADDER SUSPENSION  2009  . BREAST REDUCTION SURGERY    . COLONOSCOPY    . ESOPHAGOGASTRODUODENOSCOPY    . EYE SURGERY    . REPLACEMENT TOTAL KNEE Left   . TOE SURGERY Left    Left great toe  . TOTAL HIP ARTHROPLASTY    . TOTAL KNEE ARTHROPLASTY Right 12/09/2013   Procedure: Right Total Knee Arthroplasty;  Surgeon: Newt Minion, MD;  Location: [place];  Service: Orthopedics;  Laterality: Right;  Marland Kitchen VESICOVAGINAL FISTULA CLOSURE W/ TAH      There were no vitals filed for this visit.  Subjective Assessment - 09/09/18 1521    Subjective   Taylor Mccall presents for OT visit 20//36 to address BLE LE. Shewears LLE  custom thigh high compression stocking to clinic and states, "It did roll down a little bit and it pinched my leg."    Patient is accompanied by:  Family member    Pertinent History  anxiety, depression gait disturbance, impaired transfers, HTN, OA, RA, OSA incontinence and CVI    Limitations  chronic leg pain and swelling, difficulty walking, decreased standing and walking tolwerance    Special Tests  positive Stemmer sign bilaterally, L>R    Pain Onset  More than a month ago                   OT Treatments/Exercises (OP) - 09/09/18 0001      ADLs   ADL Education Given  Yes      Manual Therapy   Manual Therapy  Edema management;Compression Bandaging    Manual therapy comments  repeated LLE anatomical measurements to adjust compression garment fit    Compression Bandaging  kneee length  gradient compression wraps to RLE. Gradient compression wraps x 3 over Rosidal foam from base of toes to below knee over cotton stockinette. Will modify PRN             OT  Education - 09/09/18 1523    Education Details  Skilled LE self care training for wear and care regimes for custom elastic compression garments for full-time daily use issued and fit today. Provided training for donning and doffing garments using assistive devices (friction gloves and Tyvek sock).    Person(s) Educated  Patient;Child(ren)    Methods  Explanation;Demonstration;Tactile cues;Verbal cues    Comprehension  Verbalized understanding;Returned demonstration;Verbal cues required;Tactile cues required;Need further instruction          OT Long Term Goals - 09/02/18 1144      OT LONG TERM GOAL #1   Title  Pt will be able to apply  knee length, multi-layer, short stretch compression wraps (one leg at a time) using correct gradient techniques with modified independence (extra time)  to achieve optimal limb volume reduction, and to return affected limb , as closely as possible, to premorbid size and shape.     Baseline  dependent 08/05/18 : Achieved on time    Time  4    Period  Days    Status  Achieved      OT LONG TERM GOAL #2   Title  Pt to achieve no less than 10% limb volume reductions below knees bilaterally during Intensive Phase CDT to improve AROM for ADLs, safe ambulation and transfers, to reduce infection risk, and to limit LE progression.    Baseline  dependent 08/05/18: Excellent  LLE progress with 8% volume reduction measured below the knee today. 09/03/18: achieved and exceeded with 22.06% below the knee (A-D)    Time  12    Period  Weeks    Status  Partially Met      OT LONG TERM GOAL #3   Title  Pt will achieve  100% compliance with daily LE self-care program with min caregiver assistance throughout Intensive Phase CDT, including daily  skin care, lymphatic pumping therex, simple self-MLD and prescribe compression to ensure optimal limb volume reduction, to limit infection risk and to limit LE progression.    Baseline  dependent 08/05/18 : Achieved on time    Time  12    Period  Weeks    Status  Achieved      OT LONG TERM GOAL #4   Title  Pt will demonstrate modified independence using  assistive devices and extra time  during LE self-care training to don and doff properly fitting, custom compression garments/devices( on order from preferred DME vendor)for optimal LE self-management over time.     Baseline  dependent    Time  12    Period  Weeks    Status  On-going            Plan - 09/09/18 1527    Clinical Impression Statement  See Long Term Goals revised 7/28 for excellent progress towards all OT goals for LE care.  Completed repeat LLE anatomical measurements to improve fit and function of garment. Garment worn all weekend with top edge rolling down and garment bunching at ankle causing increased tourniquett effect at akle, resultiong in increased foort and ankle swelling. Shorten4d length at G by 1.5 cm and ajusted lengths at F and C. Patient able to dont existing  garment using assistive device in clinic after completed skilled teaching. Pt instructed to wash and wear custom knee high in prep for fitting at next   visit. Cont 2 x weekly until RLE compression garm,ents are fit.    OT Occupational Profile and History  Comprehensive Assessment- Review of  records and extensive additional review of physical, cognitive, psychosocial history related to current functional performance    Occupational performance deficits (Please refer to evaluation for details):  ADL's;Work;Other;IADL's;Rest and Sleep;Leisure;Social Participation   functional ambulation and transfers, body image, role  performance   Body Structure / Function / Physical Skills  ADL;Decreased knowledge of precautions;Edema;Obesity;ROM;Skin integrity;Scar mobility;Mobility;IADL;Gait    Psychosocial Skills  Coping Strategies;Habits;Environmental  Adaptations    Rehab Potential  Good    Clinical Decision Making  Several treatment options, min-mod task modification necessary    Comorbidities Affecting Occupational Performance:  Presence of comorbidities impacting occupational performance    Comorbidities impacting occupational performance description:  see SUBJECTIVE for pertinent comorbidities    Modification or Assistance to Complete Evaluation   Min-Moderate modification of tasks or assist with assess necessary to complete eval    OT Frequency  3x / week    OT Duration  12 weeks    OT Treatment/Interventions  Self-care/ADL training;Therapeutic exercise;Energy conservation;Manual lymph drainage;Scar mobilization;Therapeutic activities;Patient/family education;Compression bandaging;Manual Therapy;DME and/or AE instruction    Plan  CCDT to include manual lymphatic drainage, skin care, comrpession wraps and custom garments once swelling is reduced, and therapeutic exercise for lymphatic pumping . Pt will be instructed in LE self care at all sessions in effort to limit LE progression and to facilitate retention  of clinical gains over time.    Consulted and Agree with Plan of Care  Patient       Patient will benefit from skilled therapeutic intervention in order to improve the following deficits and impairments:   Body Structure / Function / Physical Skills: ADL, Decreased knowledge of precautions, Edema, Obesity, ROM, Skin integrity, Scar mobility, Mobility, IADL, Gait   Psychosocial Skills: Coping Strategies, Habits, Environmental  Adaptations   Visit Diagnosis: 1. Lymphedema, not elsewhere classified       Problem List Patient Active Problem List   Diagnosis Date Noted  . Routine general medical examination at a health care facility 12/11/2016  . Primary osteoarthritis of left hip 05/01/2016  . Autoimmune disease ([place]) 11/29/2015  . High risk medication use 11/29/2015  . Vitamin D deficiency 11/29/2015  . Macular degeneration 11/29/2015  . Bilateral lower extremity edema 10/26/2015  . H/O total knee replacement, bilateral 12/09/2013  . Breast mass, left 01/18/2011  . Obesity 12/08/2009  . Venous (peripheral) insufficiency 07/28/2009  . PAIN IN JOINT, ANKLE AND FOOT 01/12/2008  . Obstructive sleep apnea 08/29/2007  . GAIT DISTURBANCE 08/14/2007  . Depression 08/26/2006  . Essential hypertension 08/26/2006  . Primary osteoarthritis of both hands 08/26/2006  . Urinary incontinence 08/26/2006    Andrey Spearman, Taylor, OTR/L, [facility] 09/09/18 3:30 PM  [place] MAIN [facility] SERVICES [address] [place], [place], [address] Phone: [phone]   Fax:  [phone]  Name: Taylor Mccall MRN: [id number] Date of Birth: 04-11-1945

## 2018-09-10 ENCOUNTER — Encounter: Payer: Medicare HMO | Admitting: Occupational Therapy

## 2018-09-11 ENCOUNTER — Ambulatory Visit: Payer: Medicare HMO | Admitting: Occupational Therapy

## 2018-09-11 ENCOUNTER — Other Ambulatory Visit: Payer: Self-pay

## 2018-09-11 DIAGNOSIS — I890 Lymphedema, not elsewhere classified: Secondary | ICD-10-CM | POA: Diagnosis not present

## 2018-09-11 NOTE — Therapy (Signed)
[place] MAIN [facility] SERVICES [address] [place], [place], [address] Phone: [phone]   Fax:  [phone]  Occupational Therapy Treatment  Patient Details  Name: Taylor Mccall MRN: [id number] Date of Birth: [birth year] Referring Provider (OT): Dorothyann Peng, NP   Encounter Date: 09/11/2018  OT End of Session - 09/11/18 1702    Visit Number  21    Number of Visits  36    Date for OT Re-Evaluation  09/24/18    OT Start Time  0105    OT Stop Time  0205    OT Time Calculation (min)  60 min    Activity Tolerance  Patient tolerated treatment well;No increased pain    Behavior During Therapy  WFL for tasks assessed/performed       Past Medical History:  Diagnosis Date  . Anxiety   . Blood transfusion without reported diagnosis   . Breast mass, left   . Cataract   . Depression   . Gait disturbance   . GERD (gastroesophageal reflux disease)   . Hypertension   . Lymphedema of both lower extremities    Seeing OT at [facility] to Left Lower leg  . Obesity   . Osteoarthritis   . Rheumatoid arthritis(714.0)   . Sleep apnea    cpap  . Urinary incontinence   . Venous insufficiency     Past Surgical History:  Procedure Laterality Date  . ABDOMINAL HYSTERECTOMY    . BLADDER SURGERY     sling  . BLADDER SUSPENSION  2009  . BREAST REDUCTION SURGERY    . COLONOSCOPY    . ESOPHAGOGASTRODUODENOSCOPY    . EYE SURGERY    . REPLACEMENT TOTAL KNEE Left   . TOE SURGERY Left    Left great toe  . TOTAL HIP ARTHROPLASTY    . TOTAL KNEE ARTHROPLASTY Right 12/09/2013   Procedure: Right Total Knee Arthroplasty;  Surgeon: Newt Minion, MD;  Location: [place];  Service: Orthopedics;  Laterality: Right;  Marland Kitchen VESICOVAGINAL FISTULA CLOSURE W/ TAH      There were no vitals filed for this visit.  Subjective Assessment - 09/11/18 1700    Subjective   Ms Knee presents for OT visit 21//36 to address BLE LE. She wears  LLE custom thigh high compression stocking to clinic. She brings new LLE Jov[employer]ak HOS compression device to clinic. She agrees with plan to complete RLE meaasurements for custom garments/ devices today.    Patient is accompanied by:  Family member    Pertinent History  anxiety, depression gait disturbance, impaired transfers, HTN, OA, RA, OSA incontinence and CVI    Limitations  chronic leg pain and swelling, difficulty walking, decreased standing and walking tolwerance    Special Tests  positive Stemmer sign bilaterally, L>R    Pain Onset  More than a month ago                   OT Treatments/Exercises (OP) - 09/11/18 0001      ADLs   ADL Education Given  Yes      Manual Therapy   Manual Therapy  Edema management    Manual therapy comments  completed anatomical measurements for RLE custom compression garments and HOS device todaty.     Compression Bandaging  Daughter  to assist w  compression wraps at home after session due to time constraints             OT  Education - 09/11/18 1702    Education Details  Skilled LE self care training for wear and care regimes for custom elastic compression garments for full-time daily use issued and fit today. Provided training for donning and doffing garments using assistive devices (friction gloves and Tyvek sock).    Person(s) Educated  Patient;Child(ren)    Methods  Explanation;Demonstration;Tactile cues;Verbal cues    Comprehension  Verbalized understanding;Returned demonstration;Verbal cues required;Tactile cues required;Need further instruction          OT Long Term Goals - 09/02/18 1144      OT LONG TERM GOAL #1   Title  Pt will be able to apply  knee length, multi-layer, short stretch compression wraps (one leg at a time) using correct gradient techniques with modified independence (extra time)  to achieve optimal limb volume reduction, and to return affected limb , as closely as possible, to premorbid size and shape.     Baseline  dependent 08/05/18 : Achieved on time    Time  4    Period  Days    Status  Achieved      OT LONG TERM GOAL #2   Title  Pt to achieve no less than 10% limb volume reductions below knees bilaterally during Intensive Phase CDT to improve AROM for ADLs, safe ambulation and transfers, to reduce infection risk, and to limit LE progression.    Baseline  dependent 08/05/18: Excellent  LLE progress with 8% volume reduction measured below the knee today. 09/03/18: achieved and exceeded with 22.06% below the knee (A-D)    Time  12    Period  Weeks    Status  Partially Met      OT LONG TERM GOAL #3   Title  Pt will achieve  100% compliance with daily LE self-care program with min caregiver assistance throughout Intensive Phase CDT, including daily  skin care, lymphatic pumping therex, simple self-MLD and prescribe compression to ensure optimal limb volume reduction, to limit infection risk and to limit LE progression.    Baseline  dependent 08/05/18 : Achieved on time    Time  12    Period  Weeks    Status  Achieved      OT LONG TERM GOAL #4   Title  Pt will demonstrate modified independence using  assistive devices and extra time  during LE self-care training to don and doff properly fitting, custom compression garments/devices( on order from preferred DME vendor)for optimal LE self-management over time.     Baseline  dependent    Time  12    Period  Weeks    Status  On-going            Plan - 09/11/18 1703    Clinical Impression Statement  Fitted RLE custom knee length Jovipak neceded for reducing fibrosis and improving lymphatic function in LLE during HOAS. Excellent fit and Pt able to don and doff independently . Completed anatomical measurements for RLE compression garments and knee length Jovi. Pt tolerated all aspects of car. She continues to make steady progress towards all goals. Cont as per POC.    OT Occupational Profile and History  Comprehensive Assessment- Review of  records and extensive additional review of physical, cognitive, psychosocial history related to current functional performance    Occupational performance deficits (Please refer to evaluation for details):  ADL's;Work;Other;IADL's;Rest and Sleep;Leisure;Social Participation   functional ambulation and transfers, body image, role  performance   Body Structure / Function / Physical Skills  ADL;Decreased  knowledge of precautions;Edema;Obesity;ROM;Skin integrity;Scar mobility;Mobility;IADL;Gait    Psychosocial Skills  Coping Strategies;Habits;Environmental  Adaptations    Rehab Potential  Good    Clinical Decision Making  Several treatment options, min-mod task modification necessary    Comorbidities Affecting Occupational Performance:  Presence of comorbidities impacting occupational performance    Comorbidities impacting occupational performance description:  see SUBJECTIVE for pertinent comorbidities    Modification or Assistance to Complete Evaluation   Min-Moderate modification of tasks or assist with assess necessary to complete eval    OT Frequency  3x / week    OT Duration  12 weeks    OT Treatment/Interventions  Self-care/ADL training;Therapeutic exercise;Energy conservation;Manual lymph drainage;Scar mobilization;Therapeutic activities;Patient/family education;Compression bandaging;Manual Therapy;DME and/or AE instruction    Plan  CCDT to include manual lymphatic drainage, skin care, comrpession wraps and custom garments once swelling is reduced, and therapeutic exercise for lymphatic pumping . Pt will be instructed in LE self care at all sessions in effort to limit LE progression and to facilitate retention of clinical gains over time.    Consulted and Agree with Plan of Care  Patient       Patient will benefit from skilled therapeutic intervention in order to improve the following deficits and impairments:   Body Structure / Function / Physical Skills: ADL, Decreased knowledge of  precautions, Edema, Obesity, ROM, Skin integrity, Scar mobility, Mobility, IADL, Gait   Psychosocial Skills: Coping Strategies, Habits, Environmental  Adaptations   Visit Diagnosis: 1. Lymphedema, not elsewhere classified       Problem List Patient Active Problem List   Diagnosis Date Noted  . Routine general medical examination at a health care facility 12/11/2016  . Primary osteoarthritis of left hip 05/01/2016  . Autoimmune disease ([place]) 11/29/2015  . High risk medication use 11/29/2015  . Vitamin D deficiency 11/29/2015  . Macular degeneration 11/29/2015  . Bilateral lower extremity edema 10/26/2015  . H/O total knee replacement, bilateral 12/09/2013  . Breast mass, left 01/18/2011  . Obesity 12/08/2009  . Venous (peripheral) insufficiency 07/28/2009  . PAIN IN JOINT, ANKLE AND FOOT 01/12/2008  . Obstructive sleep apnea 08/29/2007  . GAIT DISTURBANCE 08/14/2007  . Depression 08/26/2006  . Essential hypertension 08/26/2006  . Primary osteoarthritis of both hands 08/26/2006  . Urinary incontinence 08/26/2006    Andrey Spearman, MS, OTR/L, [facility] 09/11/18 5:06 PM  [place] MAIN [facility] SERVICES [address] [place], [place], [address] Phone: [phone]   Fax:  [phone]  Name: EVAH RASHID MRN: [id number] Date of Birth: 20-Oct-1945

## 2018-09-15 ENCOUNTER — Encounter: Payer: Medicare HMO | Admitting: Occupational Therapy

## 2018-09-16 ENCOUNTER — Other Ambulatory Visit: Payer: Self-pay

## 2018-09-16 ENCOUNTER — Ambulatory Visit: Payer: Medicare HMO | Admitting: Occupational Therapy

## 2018-09-16 DIAGNOSIS — I890 Lymphedema, not elsewhere classified: Secondary | ICD-10-CM | POA: Diagnosis not present

## 2018-09-16 NOTE — Therapy (Signed)
[place] MAIN [facility] SERVICES [address] [place], [place], [address] Phone: [phone]   Fax:  [phone]  Occupational Therapy Treatment  Patient Details  Name: [place] Mccall MRN: [id number] Date of Birth: 1945/09/28 Referring Provider (OT): Dorothyann Peng, NP   Encounter Date: 09/16/2018  OT End of Session - 09/16/18 1552    Visit Number  22    Number of Visits  36    Date for OT Re-Evaluation  09/24/18    OT Start Time  0230    OT Stop Time  0330    OT Time Calculation (min)  60 min    Activity Tolerance  Patient tolerated treatment well;No increased pain    Behavior During Therapy  WFL for tasks assessed/performed       Past Medical History:  Diagnosis Date  . Anxiety   . Blood transfusion without reported diagnosis   . Breast mass, left   . Cataract   . Depression   . Gait disturbance   . GERD (gastroesophageal reflux disease)   . Hypertension   . Lymphedema of both lower extremities    Seeing OT at [place] to Left Lower leg  . Obesity   . Osteoarthritis   . Rheumatoid arthritis(714.0)   . Sleep apnea    cpap  . Urinary incontinence   . Venous insufficiency     Past Surgical History:  Procedure Laterality Date  . ABDOMINAL HYSTERECTOMY    . BLADDER SURGERY     sling  . BLADDER SUSPENSION  2009  . BREAST REDUCTION SURGERY    . COLONOSCOPY    . ESOPHAGOGASTRODUODENOSCOPY    . EYE SURGERY    . REPLACEMENT TOTAL KNEE Left   . TOE SURGERY Left    Left great toe  . TOTAL HIP ARTHROPLASTY    . TOTAL KNEE ARTHROPLASTY Right 12/09/2013   Procedure: Right Total Knee Arthroplasty;  Surgeon: Newt Minion, MD;  Location: [place];  Service: Orthopedics;  Laterality: Right;  Marland Kitchen VESICOVAGINAL FISTULA CLOSURE W/ TAH      There were no vitals filed for this visit.  Subjective Assessment - 09/16/18 1546    Subjective   Ms Marten presents for OT visit 22//36 to address BLE LE. She wears  new LLE custom knee high compression stocking to clinic fir first time today. Pt reports she is pleased to learn that her insurancewill cover  a sequential lymphedema "pump". We discussed the Flexitouch system compared to substandard devices and how this 32 chamber device is superior to 4 chamber deice and is more effecticve for lymphedema management throughout our session.    Patient is accompanied by:  Family member    Pertinent History  anxiety, depression gait disturbance, impaired transfers, HTN, OA, RA, OSA incontinence and CVI    Limitations  chronic leg pain and swelling, difficulty walking, decreased standing and walking tolwerance    Special Tests  positive Stemmer sign bilaterally, L>R    Pain Onset  More than a month ago                   OT Treatments/Exercises (OP) - 09/16/18 0001      ADLs   ADL Education Given  Yes      Manual Therapy   Manual Therapy  Edema management;Manual Lymphatic Drainage (MLD);Compression Bandaging    Edema Management  skin care to RLE throughout MLD to improve hydration and skin mobility    Manual  Lymphatic Drainage (MLD)  Commenced MLD to RLE from proximal to distal utilizing short neck sequence bilaterally, deep abdominal breathing by patient, and functional inguinal pathways. No difficulty tolerating manual therapy observed.    Compression Bandaging  RLE compression wraps as established             OT Education - 09/16/18 1551    Education Details  Continued skilled Pt/caregiver education  And LE ADL training throughout visit for lymphedema self care/ home program, including compression wrapping, compression garment and device wear/care, lymphatic pumping ther ex, simple self-MLD, and skin care. Discussed progress towards goals.    Person(s) Educated  Patient;Child(ren)    Methods  Explanation;Demonstration;Tactile cues;Verbal cues    Comprehension  Verbalized understanding;Returned demonstration;Verbal cues required;Tactile cues  required;Need further instruction          OT Long Term Goals - 09/02/18 1144      OT LONG TERM GOAL #1   Title  Pt will be able to apply  knee length, multi-layer, short stretch compression wraps (one leg at a time) using correct gradient techniques with modified independence (extra time)  to achieve optimal limb volume reduction, and to return affected limb , as closely as possible, to premorbid size and shape.    Baseline  dependent 08/05/18 : Achieved on time    Time  4    Period  Days    Status  Achieved      OT LONG TERM GOAL #2   Title  Pt to achieve no less than 10% limb volume reductions below knees bilaterally during Intensive Phase CDT to improve AROM for ADLs, safe ambulation and transfers, to reduce infection risk, and to limit LE progression.    Baseline  dependent 08/05/18: Excellent  LLE progress with 8% volume reduction measured below the knee today. 09/03/18: achieved and exceeded with 22.06% below the knee (A-D)    Time  12    Period  Weeks    Status  Partially Met      OT LONG TERM GOAL #3   Title  Pt will achieve  100% compliance with daily LE self-care program with min caregiver assistance throughout Intensive Phase CDT, including daily  skin care, lymphatic pumping therex, simple self-MLD and prescribe compression to ensure optimal limb volume reduction, to limit infection risk and to limit LE progression.    Baseline  dependent 08/05/18 : Achieved on time    Time  12    Period  Weeks    Status  Achieved      OT LONG TERM GOAL #4   Title  Pt will demonstrate modified independence using  assistive devices and extra time  during LE self-care training to don and doff properly fitting, custom compression garments/devices( on order from preferred DME vendor)for optimal LE self-management over time.     Baseline  dependent    Time  12    Period  Weeks    Status  On-going            Plan - 09/16/18 1552    Clinical Impression Statement  Assessed new RLE  compression knee high today for firwst time and determined it also needs to be remade to improve containment at distal leg and ankle, as did initial thigh length garment. DME vender confirmed she will take care of remake for Pt. Pt tolerated RLE MLD and knee length compression wraps without difficulty .Provided Pt edu throughout session re sequential pneumatic compression device ordered for her, Flexitouch, and how  it differesn from other "pump" for optimal management of LE over time. Pt agrees with plan to continue as per POC until custom garment fittings are completed.    OT Occupational Profile and History  Comprehensive Assessment- Review of records and extensive additional review of physical, cognitive, psychosocial history related to current functional performance    Occupational performance deficits (Please refer to evaluation for details):  ADL's;Work;Other;IADL's;Rest and Sleep;Leisure;Social Participation   functional ambulation and transfers, body image, role  performance   Body Structure / Function / Physical Skills  ADL;Decreased knowledge of precautions;Edema;Obesity;ROM;Skin integrity;Scar mobility;Mobility;IADL;Gait    Psychosocial Skills  Coping Strategies;Habits;Environmental  Adaptations    Rehab Potential  Good    Clinical Decision Making  Several treatment options, min-mod task modification necessary    Comorbidities Affecting Occupational Performance:  Presence of comorbidities impacting occupational performance    Comorbidities impacting occupational performance description:  see SUBJECTIVE for pertinent comorbidities    Modification or Assistance to Complete Evaluation   Min-Moderate modification of tasks or assist with assess necessary to complete eval    OT Frequency  3x / week    OT Duration  12 weeks    OT Treatment/Interventions  Self-care/ADL training;Therapeutic exercise;Energy conservation;Manual lymph drainage;Scar mobilization;Therapeutic activities;Patient/family  education;Compression bandaging;Manual Therapy;DME and/or AE instruction    Plan  CCDT to include manual lymphatic drainage, skin care, comrpession wraps and custom garments once swelling is reduced, and therapeutic exercise for lymphatic pumping . Pt will be instructed in LE self care at all sessions in effort to limit LE progression and to facilitate retention of clinical gains over time.    Consulted and Agree with Plan of Care  Patient       Patient will benefit from skilled therapeutic intervention in order to improve the following deficits and impairments:   Body Structure / Function / Physical Skills: ADL, Decreased knowledge of precautions, Edema, Obesity, ROM, Skin integrity, Scar mobility, Mobility, IADL, Gait   Psychosocial Skills: Coping Strategies, Habits, Environmental  Adaptations   Visit Diagnosis: 1. Lymphedema, not elsewhere classified       Problem List Patient Active Problem List   Diagnosis Date Noted  . Routine general medical examination at a health care facility 12/11/2016  . Primary osteoarthritis of left hip 05/01/2016  . Autoimmune disease ([place]) 11/29/2015  . High risk medication use 11/29/2015  . Vitamin D deficiency 11/29/2015  . Macular degeneration 11/29/2015  . Bilateral lower extremity edema 10/26/2015  . H/O total knee replacement, bilateral 12/09/2013  . Breast mass, left 01/18/2011  . Obesity 12/08/2009  . Venous (peripheral) insufficiency 07/28/2009  . PAIN IN JOINT, ANKLE AND FOOT 01/12/2008  . Obstructive sleep apnea 08/29/2007  . GAIT DISTURBANCE 08/14/2007  . Depression 08/26/2006  . Essential hypertension 08/26/2006  . Primary osteoarthritis of both hands 08/26/2006  . Urinary incontinence 08/26/2006    Andrey Spearman, MS, OTR/L, [facility] 09/16/18 3:57 PM'   [place] MAIN [facility] SERVICES [address] [place], [place], [address] Phone: [phone]   Fax:  [phone]  Name: TUWANNA KRAUSZ MRN: [id number] Date of Birth: 1945-02-26

## 2018-09-17 ENCOUNTER — Encounter: Payer: Medicare HMO | Admitting: Occupational Therapy

## 2018-09-17 ENCOUNTER — Telehealth: Payer: Self-pay | Admitting: Adult Health

## 2018-09-17 ENCOUNTER — Encounter: Payer: Self-pay | Admitting: Physician Assistant

## 2018-09-17 DIAGNOSIS — Z96653 Presence of artificial knee joint, bilateral: Secondary | ICD-10-CM | POA: Diagnosis not present

## 2018-09-17 DIAGNOSIS — E2839 Other primary ovarian failure: Secondary | ICD-10-CM | POA: Diagnosis not present

## 2018-09-17 DIAGNOSIS — E559 Vitamin D deficiency, unspecified: Secondary | ICD-10-CM | POA: Diagnosis not present

## 2018-09-17 DIAGNOSIS — R29890 Loss of height: Secondary | ICD-10-CM | POA: Diagnosis not present

## 2018-09-17 DIAGNOSIS — G4733 Obstructive sleep apnea (adult) (pediatric): Secondary | ICD-10-CM | POA: Diagnosis not present

## 2018-09-17 DIAGNOSIS — Z90710 Acquired absence of both cervix and uterus: Secondary | ICD-10-CM | POA: Diagnosis not present

## 2018-09-17 DIAGNOSIS — Z780 Asymptomatic menopausal state: Secondary | ICD-10-CM | POA: Diagnosis not present

## 2018-09-17 NOTE — Telephone Encounter (Signed)
Left a message on confidential voicemail informing Lovena Le that Taylor Mccall is out of the office until next week and the order can be addressed at that time.  Nothing further needed at this time.

## 2018-09-17 NOTE — Telephone Encounter (Signed)
Copied from [place] [id number]. Topic: General - Other >> Sep 17, 2018  3:21 PM Yvette Rack wrote: Reason for CRM: Lovena Le with [employer] called for an update on the request for compression device that was faxed yesterday 09/16/18. Cb# [phone]

## 2018-09-18 ENCOUNTER — Other Ambulatory Visit: Payer: Self-pay

## 2018-09-18 ENCOUNTER — Ambulatory Visit: Payer: Medicare HMO | Admitting: Occupational Therapy

## 2018-09-18 DIAGNOSIS — I890 Lymphedema, not elsewhere classified: Secondary | ICD-10-CM

## 2018-09-18 NOTE — Therapy (Signed)
[place] MAIN [facility] SERVICES [address] [place], [place], [address] Phone: [phone]   Fax:  [phone]  Occupational Therapy Treatment  Patient Details  Name: [place] Mccall MRN: [id number] Date of Birth: 1946/01/29 Referring Provider (OT): [place] Peng, NP   Encounter Date: 09/18/2018  OT End of Session - 09/18/18 1403    Visit Number  23    Number of Visits  36    Date for OT Re-Evaluation  09/24/18    OT Start Time  0100    OT Stop Time  0200    OT Time Calculation (min)  60 min    Activity Tolerance  Patient tolerated treatment well;No increased pain    Behavior During Therapy  WFL for tasks assessed/performed       Past Medical History:  Diagnosis Date  . Anxiety   . Blood transfusion without reported diagnosis   . Breast mass, left   . Cataract   . Depression   . Gait disturbance   . GERD (gastroesophageal reflux disease)   . Hypertension   . Lymphedema of both lower extremities    Seeing OT at [place] to Left Lower leg  . Obesity   . Osteoarthritis   . Rheumatoid arthritis(714.0)   . Sleep apnea    cpap  . Urinary incontinence   . Venous insufficiency     Past Surgical History:  Procedure Laterality Date  . ABDOMINAL HYSTERECTOMY    . BLADDER SURGERY     sling  . BLADDER SUSPENSION  2009  . BREAST REDUCTION SURGERY    . COLONOSCOPY    . ESOPHAGOGASTRODUODENOSCOPY    . EYE SURGERY    . REPLACEMENT TOTAL KNEE Left   . TOE SURGERY Left    Left great toe  . TOTAL HIP ARTHROPLASTY    . TOTAL KNEE ARTHROPLASTY Right 12/09/2013   Procedure: Right Total Knee Arthroplasty;  Surgeon: Newt Minion, MD;  Location: [place];  Service: Orthopedics;  Laterality: Right;  Marland Kitchen VESICOVAGINAL FISTULA CLOSURE W/ TAH      There were no vitals filed for this visit.  Subjective Assessment - 09/18/18 1401    Subjective   Ms Mccall presents for OT visit 23//36 to address BLE LE. Shepresents  with compression garments on bilaterally, new garment on the LLE and existing garment on the R. Pt brings compression wraps to clinic. She ahs no new complaints today.    Patient is accompanied by:  Family member    Pertinent History  anxiety, depression gait disturbance, impaired transfers, HTN, OA, RA, OSA incontinence and CVI    Limitations  chronic leg pain and swelling, difficulty walking, decreased standing and walking tolwerance    Special Tests  positive Stemmer sign bilaterally, L>R    Pain Onset  More than a month ago                   OT Treatments/Exercises (OP) - 09/18/18 0001      ADLs   ADL Education Given  Yes      Manual Therapy   Manual Therapy  Edema management;Manual Lymphatic Drainage (MLD);Compression Bandaging    Edema Management  skin care to RLE throughout MLD to improve hydration and skin mobility    Manual Lymphatic Drainage (MLD)  Commenced MLD to RLE from proximal to distal utilizing short neck sequence bilaterally, deep abdominal breathing by patient, and functional inguinal pathways. No difficulty tolerating manual therapy observed.  Compression Bandaging  RLE compression wraps as established             OT Education - 09/18/18 1403    Education Details  Continued skilled Pt/caregiver education  And LE ADL training throughout visit for lymphedema self care/ home program, including compression wrapping, compression garment and device wear/care, lymphatic pumping ther ex, simple self-MLD, and skin care. Discussed progress towards goals.    Person(s) Educated  Patient;Child(ren)    Methods  Explanation;Demonstration;Tactile cues;Verbal cues    Comprehension  Verbalized understanding;Returned demonstration;Verbal cues required;Tactile cues required;Need further instruction          OT Long Term Goals - 09/02/18 1144      OT LONG TERM GOAL #1   Title  Pt will be able to apply  knee length, multi-layer, short stretch compression wraps  (one leg at a time) using correct gradient techniques with modified independence (extra time)  to achieve optimal limb volume reduction, and to return affected limb , as closely as possible, to premorbid size and shape.    Baseline  dependent 08/05/18 : Achieved on time    Time  4    Period  Days    Status  Achieved      OT LONG TERM GOAL #2   Title  Pt to achieve no less than 10% limb volume reductions below knees bilaterally during Intensive Phase CDT to improve AROM for ADLs, safe ambulation and transfers, to reduce infection risk, and to limit LE progression.    Baseline  dependent 08/05/18: Excellent  LLE progress with 8% volume reduction measured below the knee today. 09/03/18: achieved and exceeded with 22.06% below the knee (A-D)    Time  12    Period  Weeks    Status  Partially Met      OT LONG TERM GOAL #3   Title  Pt will achieve  100% compliance with daily LE self-care program with min caregiver assistance throughout Intensive Phase CDT, including daily  skin care, lymphatic pumping therex, simple self-MLD and prescribe compression to ensure optimal limb volume reduction, to limit infection risk and to limit LE progression.    Baseline  dependent 08/05/18 : Achieved on time    Time  12    Period  Weeks    Status  Achieved      OT LONG TERM GOAL #4   Title  Pt will demonstrate modified independence using  assistive devices and extra time  during LE self-care training to don and doff properly fitting, custom compression garments/devices( on order from preferred DME vendor)for optimal LE self-management over time.     Baseline  dependent    Time  12    Period  Weeks    Status  On-going            Plan - 09/18/18 1639    Clinical Impression Statement  Pt tolerated MLD, skin care and compression wraps to RLE without difficulty today. Pt managing skin condition and limb swelling very well between sessions with daughter's assistance as she anxiously awaits remaining  custom  garment and device fittings. Cont as per POC.    OT Occupational Profile and History  Comprehensive Assessment- Review of records and extensive additional review of physical, cognitive, psychosocial history related to current functional performance    Occupational performance deficits (Please refer to evaluation for details):  ADL's;Work;Other;IADL's;Rest and Sleep;Leisure;Social Participation   functional ambulation and transfers, body image, role  performance   Body Structure / Function /  Physical Skills  ADL;Decreased knowledge of precautions;Edema;Obesity;ROM;Skin integrity;Scar mobility;Mobility;IADL;Gait    Psychosocial Skills  Coping Strategies;Habits;Environmental  Adaptations    Rehab Potential  Good    Clinical Decision Making  Several treatment options, min-mod task modification necessary    Comorbidities Affecting Occupational Performance:  Presence of comorbidities impacting occupational performance    Comorbidities impacting occupational performance description:  see SUBJECTIVE for pertinent comorbidities    Modification or Assistance to Complete Evaluation   Min-Moderate modification of tasks or assist with assess necessary to complete eval    OT Frequency  3x / week    OT Duration  12 weeks    OT Treatment/Interventions  Self-care/ADL training;Therapeutic exercise;Energy conservation;Manual lymph drainage;Scar mobilization;Therapeutic activities;Patient/family education;Compression bandaging;Manual Therapy;DME and/or AE instruction    Plan  CCDT to include manual lymphatic drainage, skin care, comrpession wraps and custom garments once swelling is reduced, and therapeutic exercise for lymphatic pumping . Pt will be instructed in LE self care at all sessions in effort to limit LE progression and to facilitate retention of clinical gains over time.    Consulted and Agree with Plan of Care  Patient       Patient will benefit from skilled therapeutic intervention in order to improve  the following deficits and impairments:   Body Structure / Function / Physical Skills: ADL, Decreased knowledge of precautions, Edema, Obesity, ROM, Skin integrity, Scar mobility, Mobility, IADL, Gait   Psychosocial Skills: Coping Strategies, Habits, Environmental  Adaptations   Visit Diagnosis: 1. Lymphedema, not elsewhere classified       Problem List Patient Active Problem List   Diagnosis Date Noted  . Routine general medical examination at a health care facility 12/11/2016  . Primary osteoarthritis of left hip 05/01/2016  . Autoimmune disease ([place]) 11/29/2015  . High risk medication use 11/29/2015  . Vitamin D deficiency 11/29/2015  . Macular degeneration 11/29/2015  . Bilateral lower extremity edema 10/26/2015  . H/O total knee replacement, bilateral 12/09/2013  . Breast mass, left 01/18/2011  . Obesity 12/08/2009  . Venous (peripheral) insufficiency 07/28/2009  . PAIN IN JOINT, ANKLE AND FOOT 01/12/2008  . Obstructive sleep apnea 08/29/2007  . GAIT DISTURBANCE 08/14/2007  . Depression 08/26/2006  . Essential hypertension 08/26/2006  . Primary osteoarthritis of both hands 08/26/2006  . Urinary incontinence 08/26/2006    Andrey Spearman, MS, OTR/L, [facility] 09/18/18 4:42 PM   [place] MAIN [facility] SERVICES [address] [place], [place], [address] Phone: [phone]   Fax:  [phone]  Name: [place] Mccall MRN: [id number] Date of Birth: 1945-12-21

## 2018-09-23 ENCOUNTER — Ambulatory Visit: Payer: Medicare HMO | Admitting: Occupational Therapy

## 2018-09-23 ENCOUNTER — Other Ambulatory Visit: Payer: Self-pay

## 2018-09-23 DIAGNOSIS — I890 Lymphedema, not elsewhere classified: Secondary | ICD-10-CM | POA: Diagnosis not present

## 2018-09-23 NOTE — Therapy (Signed)
[place] MAIN [facility] SERVICES [address] [place], [place], [address] Phone: [phone]   Fax:  [phone]  Occupational Therapy Treatment  Patient Details  Name: Taylor Mccall MRN: [id number] Date of Birth: July 15, 1945 Referring Provider (OT): Dorothyann Peng, NP   Encounter Date: 09/23/2018  OT End of Session - 09/23/18 1706    Visit Number  24    Number of Visits  36    Date for OT Re-Evaluation  09/24/18    OT Start Time  1000    OT Stop Time  1100    OT Time Calculation (min)  60 min    Activity Tolerance  Patient tolerated treatment well;No increased pain    Behavior During Therapy  WFL for tasks assessed/performed       Past Medical History:  Diagnosis Date  . Anxiety   . Blood transfusion without reported diagnosis   . Breast mass, left   . Cataract   . Depression   . Gait disturbance   . GERD (gastroesophageal reflux disease)   . Hypertension   . Lymphedema of both lower extremities    Seeing OT at [place] to Left Lower leg  . Obesity   . Osteoarthritis   . Rheumatoid arthritis(714.0)   . Sleep apnea    cpap  . Urinary incontinence   . Venous insufficiency     Past Surgical History:  Procedure Laterality Date  . ABDOMINAL HYSTERECTOMY    . BLADDER SURGERY     sling  . BLADDER SUSPENSION  2009  . BREAST REDUCTION SURGERY    . COLONOSCOPY    . ESOPHAGOGASTRODUODENOSCOPY    . EYE SURGERY    . REPLACEMENT TOTAL KNEE Left   . TOE SURGERY Left    Left great toe  . TOTAL HIP ARTHROPLASTY    . TOTAL KNEE ARTHROPLASTY Right 12/09/2013   Procedure: Right Total Knee Arthroplasty;  Surgeon: Newt Minion, MD;  Location: [facility];  Service: Orthopedics;  Laterality: Right;  Marland Kitchen VESICOVAGINAL FISTULA CLOSURE W/ TAH      There were no vitals filed for this visit.  Subjective Assessment - 09/23/18 1004    Subjective   Taylor Mccall presents for OT visit 24//36 to address BLE LE. She  presents with compression garments on bilaterally, new garment on the LLE and existing garment on the R. Pt brings compression wraps to clinic. Pt denies LE related complaints.    Patient is accompanied by:  Family member    Pertinent History  anxiety, depression gait disturbance, impaired transfers, HTN, OA, RA, OSA incontinence and CVI    Limitations  chronic leg pain and swelling, difficulty walking, decreased standing and walking tolwerance    Special Tests  positive Stemmer sign bilaterally, L>R    Pain Onset  More than a month ago                   OT Treatments/Exercises (OP) - 09/23/18 0001      ADLs   ADL Education Given  Yes      Manual Therapy   Manual Therapy  Edema management    Edema Management  skin care to RLE throughout MLD to improve hydration and skin mobility    Manual Lymphatic Drainage (MLD)  Commenced MLD to RLE from proximal to distal utilizing short neck sequence bilaterally, deep abdominal breathing by patient, and functional inguinal pathways. No difficulty tolerating manual therapy observed.    Compression Bandaging  RLE compression wraps as established                  OT Long Term Goals - 09/02/18 1144      OT LONG TERM GOAL #1   Title  Pt will be able to apply  knee length, multi-layer, short stretch compression wraps (one leg at a time) using correct gradient techniques with modified independence (extra time)  to achieve optimal limb volume reduction, and to return affected limb , as closely as possible, to premorbid size and shape.    Baseline  dependent 08/05/18 : Achieved on time    Time  4    Period  Days    Status  Achieved      OT LONG TERM GOAL #2   Title  Pt to achieve no less than 10% limb volume reductions below knees bilaterally during Intensive Phase CDT to improve AROM for ADLs, safe ambulation and transfers, to reduce infection risk, and to limit LE progression.    Baseline  dependent 08/05/18: Excellent  LLE progress  with 8% volume reduction measured below the knee today. 09/03/18: achieved and exceeded with 22.06% below the knee (A-D)    Time  12    Period  Weeks    Status  Partially Met      OT LONG TERM GOAL #3   Title  Pt will achieve  100% compliance with daily LE self-care program with min caregiver assistance throughout Intensive Phase CDT, including daily  skin care, lymphatic pumping therex, simple self-MLD and prescribe compression to ensure optimal limb volume reduction, to limit infection risk and to limit LE progression.    Baseline  dependent 08/05/18 : Achieved on time    Time  12    Period  Weeks    Status  Achieved      OT LONG TERM GOAL #4   Title  Pt will demonstrate modified independence using  assistive devices and extra time  during LE self-care training to don and doff properly fitting, custom compression garments/devices( on order from preferred DME vendor)for optimal LE self-management over time.     Baseline  dependent    Time  12    Period  Weeks    Status  On-going            Plan - 09/23/18 1707    Clinical Impression Statement  Pt managing BLE swelling well between visits with compression wraps on the RLE ( awaiting garment delivery) and custom compression stociings on the L. Pt tolerated MLD, skin care and compression therapy during todaY'S SESSION WITHOUT DIFFICULTY. lEG RELATED PAIN REPORTED AS 0/10. cONT AS PER poc.EDUCE rX FREQUENCY ONCE rle COMPRESSION GARMENTS AND DEVICE ARE FITTED.    OT Occupational Profile and History  Comprehensive Assessment- Review of records and extensive additional review of physical, cognitive, psychosocial history related to current functional performance    Occupational performance deficits (Please refer to evaluation for details):  ADL's;Work;Other;IADL's;Rest and Sleep;Leisure;Social Participation   functional ambulation and transfers, body image, role  performance   Body Structure / Function / Physical Skills  ADL;Decreased knowledge  of precautions;Edema;Obesity;ROM;Skin integrity;Scar mobility;Mobility;IADL;Gait    Psychosocial Skills  Coping Strategies;Habits;Environmental  Adaptations    Rehab Potential  Good    Clinical Decision Making  Several treatment options, min-mod task modification necessary    Comorbidities Affecting Occupational Performance:  Presence of comorbidities impacting occupational performance    Comorbidities impacting occupational performance description:  see SUBJECTIVE for pertinent comorbidities  Modification or Assistance to Complete Evaluation   Min-Moderate modification of tasks or assist with assess necessary to complete eval    OT Frequency  3x / week    OT Duration  12 weeks    OT Treatment/Interventions  Self-care/ADL training;Therapeutic exercise;Energy conservation;Manual lymph drainage;Scar mobilization;Therapeutic activities;Patient/family education;Compression bandaging;Manual Therapy;DME and/or AE instruction    Plan  CCDT to include manual lymphatic drainage, skin care, comrpession wraps and custom garments once swelling is reduced, and therapeutic exercise for lymphatic pumping . Pt will be instructed in LE self care at all sessions in effort to limit LE progression and to facilitate retention of clinical gains over time.    Consulted and Agree with Plan of Care  Patient       Patient will benefit from skilled therapeutic intervention in order to improve the following deficits and impairments:   Body Structure / Function / Physical Skills: ADL, Decreased knowledge of precautions, Edema, Obesity, ROM, Skin integrity, Scar mobility, Mobility, IADL, Gait   Psychosocial Skills: Coping Strategies, Habits, Environmental  Adaptations   Visit Diagnosis: 1. Lymphedema, not elsewhere classified       Problem List Patient Active Problem List   Diagnosis Date Noted  . Routine general medical examination at a health care facility 12/11/2016  . Primary osteoarthritis of left hip  05/01/2016  . Autoimmune disease ([place]) 11/29/2015  . High risk medication use 11/29/2015  . Vitamin D deficiency 11/29/2015  . Macular degeneration 11/29/2015  . Bilateral lower extremity edema 10/26/2015  . H/O total knee replacement, bilateral 12/09/2013  . Breast mass, left 01/18/2011  . Obesity 12/08/2009  . Venous (peripheral) insufficiency 07/28/2009  . PAIN IN JOINT, ANKLE AND FOOT 01/12/2008  . Obstructive sleep apnea 08/29/2007  . GAIT DISTURBANCE 08/14/2007  . Depression 08/26/2006  . Essential hypertension 08/26/2006  . Primary osteoarthritis of both hands 08/26/2006  . Urinary incontinence 08/26/2006    Andrey Spearman, Taylor, OTR/L, [facility] 09/23/18 5:10 PM  [place] MAIN [facility] SERVICES [address] [place], [place], [address] Phone: [phone]   Fax:  [phone]  Name: Taylor Mccall MRN: [id number] Date of Birth: Jun 17, 1945

## 2018-09-26 NOTE — Telephone Encounter (Signed)
Want to know status of form that was faxed for pt. Please advise

## 2018-09-30 ENCOUNTER — Ambulatory Visit: Payer: Medicare HMO | Admitting: Occupational Therapy

## 2018-09-30 ENCOUNTER — Other Ambulatory Visit: Payer: Self-pay

## 2018-09-30 DIAGNOSIS — I890 Lymphedema, not elsewhere classified: Secondary | ICD-10-CM

## 2018-09-30 NOTE — Therapy (Signed)
[place] MAIN [facility] SERVICES [address] [place], [place], [address] Phone: [phone]   Fax:  [phone]  Occupational Therapy Treatment Note and Progress Report  Patient Details  Name: Taylor Mccall MRN: [id number] Date of Birth: 1945/06/12 Referring Provider (OT): Dorothyann Peng, NP   Encounter Date: 09/30/2018  OT End of Session - 09/30/18 1321    Visit Number  25    Number of Visits  36    Date for OT Re-Evaluation  09/24/18    OT Start Time  1000    OT Stop Time  1100    OT Time Calculation (min)  60 min    Activity Tolerance  Patient tolerated treatment well;No increased pain    Behavior During Therapy  WFL for tasks assessed/performed       Past Medical History:  Diagnosis Date  . Anxiety   . Blood transfusion without reported diagnosis   . Breast mass, left   . Cataract   . Depression   . Gait disturbance   . GERD (gastroesophageal reflux disease)   . Hypertension   . Lymphedema of both lower extremities    Seeing OT at [place] to Left Lower leg  . Obesity   . Osteoarthritis   . Rheumatoid arthritis(714.0)   . Sleep apnea    cpap  . Urinary incontinence   . Venous insufficiency     Past Surgical History:  Procedure Laterality Date  . ABDOMINAL HYSTERECTOMY    . BLADDER SURGERY     sling  . BLADDER SUSPENSION  2009  . BREAST REDUCTION SURGERY    . COLONOSCOPY    . ESOPHAGOGASTRODUODENOSCOPY    . EYE SURGERY    . REPLACEMENT TOTAL KNEE Left   . TOE SURGERY Left    Left great toe  . TOTAL HIP ARTHROPLASTY    . TOTAL KNEE ARTHROPLASTY Right 12/09/2013   Procedure: Right Total Knee Arthroplasty;  Surgeon: Newt Minion, MD;  Location: [place];  Service: Orthopedics;  Laterality: Right;  Marland Kitchen VESICOVAGINAL FISTULA CLOSURE W/ TAH      There were no vitals filed for this visit.  Subjective Assessment - 09/30/18 1059    Subjective   Ms Sgroi presents for OT visit 25//36 to  address BLE LE. She presents with compression garments on bilaterally, new garment on the LLE and existing garment on the R. Pt brings compression wraps to clinic. Pt denies LE related complaints.    Patient is accompanied by:  Family member    Pertinent History  anxiety, depression gait disturbance, impaired transfers, HTN, OA, RA, OSA incontinence and CVI    Limitations  chronic leg pain and swelling, difficulty walking, decreased standing and walking tolwerance    Special Tests  positive Stemmer sign bilaterally, L>R    Pain Onset  More than a month ago          LYMPHEDEMA/ONCOLOGY QUESTIONNAIRE - 09/30/18 1308      Right Lower Extremity Lymphedema   Other  RLE limb volume measures 4540.23 ml.    Other  RLE limb volume from ankle to tibial tuberosity (A-D) is decreased overall since commencing OT for CDT by 7.7%. This value does not meet 10% limb volume reduction goal.      Left Lower Extremity Lymphedema   Other  LLE A-D ( ankle to tibial tuberosity) limb volume measures 5184.20 ml.    Other  LLE  A-D limb volume is DEreased overall by  16.88% since commencing OT for CDT on 07/03/18. 10% volume reduction    goal met for LLE .    Other  final LVD= 12.42%, L>R, decreased from 21.16%, L>R initially.              OT Treatments/Exercises (OP) - 09/30/18 0001      ADLs   ADL Education Given  Yes      Manual Therapy   Manual Therapy  Edema management    Manual therapy comments  final cmoparative BLE limb volumetrics    Edema Management  custom BLE compression garment fitting. Only remaining device not fitted is LLE knee length Jovi, which was ordered 2-3 weeks ago.    Compression Bandaging  BLE A-D and A-G, ccl 3(35-45 mmHg) , custom, flatt knit . Jobst ELVAREX compression garments w/ open toe and double silicone top band on thigh highs and 2.5 cm SB on top on knee highs- both open toe             OT Education - 09/30/18 1316    Education Details  Pt edu for progress  towards all OT goals for CDT. Discussed custom compression garment fit and function, assessment for condition and recommended replacement schedule q 3-6 months when worn out and no longer effective for controlling swelling. Pt edu for transition to self management phase of CDT as she completes Intensive Phase today with final garment fitting. Reviewed long term goal added today and all self care protocols.    Person(s) Educated  Patient    Methods  Explanation;Demonstration;Verbal cues    Comprehension  Verbalized understanding;Returned demonstration;Verbal cues required          OT Long Term Goals - 09/30/18 1050      OT LONG TERM GOAL #1   Title  Pt will be able to apply  knee length, multi-layer, short stretch compression wraps (one leg at a time) using correct gradient techniques with modified independence (extra time)  to achieve optimal limb volume reduction, and to return affected limb , as closely as possible, to premorbid size and shape.    Baseline  dependent 08/05/18 : Achieved on time    Time  4    Period  Days    Status  Achieved      OT LONG TERM GOAL #2   Title  Pt to achieve no less than 10% limb volume reductions below knees bilaterally during Intensive Phase CDT to improve AROM for ADLs, safe ambulation and transfers, to reduce infection risk, and to limit LE progression.    Baseline  dependent 08/05/18: Excellent  LLE progress with 8% volume reduction measured below the knee today. 09/03/18: achieved and exceeded with 22.06% below the knee (A-D)    Time  12    Period  Weeks    Status  Partially Met      OT LONG TERM GOAL #3   Title  Pt will achieve  100% compliance with daily LE self-care program with min caregiver assistance throughout Intensive Phase CDT, including daily  skin care, lymphatic pumping therex, simple self-MLD and prescribe compression to ensure optimal limb volume reduction, to limit infection risk and to limit LE progression.    Baseline  dependent  08/05/18 : Achieved on time    Time  12    Period  Weeks    Status  Achieved      OT LONG TERM GOAL #4   Title  Pt will demonstrate modified independence using  assistive devices  and extra time  during LE self-care training to don and doff properly fitting, custom compression garments/devices( on order from preferred DME vendor)for optimal LE self-management over time.     Baseline  dependent    Time  12    Period  Weeks    Status  Achieved      OT LONG TERM GOAL #5   Title  Self Management Phase CDT: Pt will retain limb volume reduction with no greater than 3% increase over 6 months using strategies and tools   learned in OT during LE care.    Baseline  Min A    Time  3    Period  Months    Status  New    Target Date  12/29/18            Plan - 09/30/18 1321    Clinical Impression Statement  Completed final custom compression garment fitting today. Pt has received 1 pair custom, flat knit, ccl 3 (35-45 mmHg) Jobst ELVAREX compression knee high and one pr thigh length compression garments. Both pair fit well after remakes   cmpleted and appear to function wel with appropriate comfort and swelling containment and control. Pt is awaiting LLE custom HOS device ( knee length Jobst JOVIPAK) to be delivered and fitted. The RLE device was remade and fit is greatly improved. Pt will assess fit of LLE device and call OT if any concerns arise with fit and/ or function. Pt completes Intensive Phase CDT for LE care today and transitions to self-management phase. Final Intensive Phase limb volumetrics reveal a significant 16.88% limb volume decrease below the knee on the LLE ( Rx limb), and a 7.7%  below the knee reduction in the dominant R leg. The LLE reduction value meets and exceeds the 10% reduction goal set   when CDT commenced. THE RLE value does not meet   this 105 goal; however, reduction is both visible and palpable as this limb was significantly less affected than the LLE. Limb volume  diffierential (LVD) today          is much reduced  today, measureing 12.42%, LLE > RLE, compared with initial LVD measuring 21.16%, L>R. Pt will return in 3 months for follow along and for additional compression garments PRN. Pt will call with any questions or concerns during vsit interval. All previously eatablished OT goals, except for RLE volume reduction goal                , met    OT Occupational Profile and History  Comprehensive Assessment- Review of records and extensive additional review of physical, cognitive, psychosocial history related to current functional performance    Occupational performance deficits (Please refer to evaluation for details):  ADL's;Work;Other;IADL's;Rest and Sleep;Leisure;Social Participation   functional ambulation and transfers, body image, role  performance   Body Structure / Function / Physical Skills  ADL;Decreased knowledge of precautions;Edema;Obesity;ROM;Skin integrity;Scar mobility;Mobility;IADL;Gait    Psychosocial Skills  Coping Strategies;Habits;Environmental  Adaptations    Rehab Potential  Good    Clinical Decision Making  Several treatment options, min-mod task modification necessary    Comorbidities Affecting Occupational Performance:  Presence of comorbidities impacting occupational performance    Comorbidities impacting occupational performance description:  see SUBJECTIVE for pertinent comorbidities    Modification or Assistance to Complete Evaluation   Min-Moderate modification of tasks or assist with assess necessary to complete eval    OT Frequency  3x / week    OT Duration  12  weeks    OT Treatment/Interventions  Self-care/ADL training;Therapeutic exercise;Energy conservation;Manual lymph drainage;Scar mobilization;Therapeutic activities;Patient/family education;Compression bandaging;Manual Therapy;DME and/or AE instruction    Plan  CCDT to include manual lymphatic drainage, skin care, comrpession wraps and custom garments once swelling is  reduced, and therapeutic exercise for lymphatic pumping . Pt will be instructed in LE self care at all sessions in effort to limit LE progression and to facilitate retention of clinical gains over time.    Consulted and Agree with Plan of Care  Patient       Patient will benefit from skilled therapeutic intervention in order to improve the following deficits and impairments:   Body Structure / Function / Physical Skills: ADL, Decreased knowledge of precautions, Edema, Obesity, ROM, Skin integrity, Scar mobility, Mobility, IADL, Gait   Psychosocial Skills: Coping Strategies, Habits, Environmental  Adaptations   Visit Diagnosis: Lymphedema, not elsewhere classified    Problem List Patient Active Problem List   Diagnosis Date Noted  . Routine general medical examination at a health care facility 12/11/2016  . Primary osteoarthritis of left hip 05/01/2016  . Autoimmune disease ([place]) 11/29/2015  . High risk medication use 11/29/2015  . Vitamin D deficiency 11/29/2015  . Macular degeneration 11/29/2015  . Bilateral lower extremity edema 10/26/2015  . H/O total knee replacement, bilateral 12/09/2013  . Breast mass, left 01/18/2011  . Obesity 12/08/2009  . Venous (peripheral) insufficiency 07/28/2009  . PAIN IN JOINT, ANKLE AND FOOT 01/12/2008  . Obstructive sleep apnea 08/29/2007  . GAIT DISTURBANCE 08/14/2007  . Depression 08/26/2006  . Essential hypertension 08/26/2006  . Primary osteoarthritis of both hands 08/26/2006  . Urinary incontinence 08/26/2006    Andrey Spearman, MS, OTR/L, [facility] 09/30/18 1:31 PM  [place] MAIN [facility] SERVICES [address] [place], [place], [address] Phone: [phone]   Fax:  [phone]  Name: Taylor Mccall MRN: [id number] Date of Birth: 1945-11-07

## 2018-10-01 NOTE — Telephone Encounter (Signed)
Form received and placed in Cory's folder for signature.

## 2018-10-02 DIAGNOSIS — H524 Presbyopia: Secondary | ICD-10-CM | POA: Diagnosis not present

## 2018-10-02 DIAGNOSIS — H401132 Primary open-angle glaucoma, bilateral, moderate stage: Secondary | ICD-10-CM | POA: Diagnosis not present

## 2018-10-02 DIAGNOSIS — Z961 Presence of intraocular lens: Secondary | ICD-10-CM | POA: Diagnosis not present

## 2018-10-02 DIAGNOSIS — Z79899 Other long term (current) drug therapy: Secondary | ICD-10-CM | POA: Diagnosis not present

## 2018-10-02 DIAGNOSIS — M069 Rheumatoid arthritis, unspecified: Secondary | ICD-10-CM | POA: Diagnosis not present

## 2018-10-02 NOTE — Telephone Encounter (Signed)
Taylor Mccall from [employer].  States that she needs to know something about this RX.  States she would like this expedited to priority message.

## 2018-10-02 NOTE — Telephone Encounter (Signed)
Form faxed.  Nothing further needed.

## 2018-10-02 NOTE — Telephone Encounter (Signed)
Please advise 

## 2018-10-07 DIAGNOSIS — I890 Lymphedema, not elsewhere classified: Secondary | ICD-10-CM | POA: Diagnosis not present

## 2018-10-18 DIAGNOSIS — G4733 Obstructive sleep apnea (adult) (pediatric): Secondary | ICD-10-CM | POA: Diagnosis not present

## 2018-10-22 DIAGNOSIS — R69 Illness, unspecified: Secondary | ICD-10-CM | POA: Diagnosis not present

## 2018-10-31 ENCOUNTER — Other Ambulatory Visit: Payer: Self-pay | Admitting: Rheumatology

## 2018-10-31 DIAGNOSIS — M359 Systemic involvement of connective tissue, unspecified: Secondary | ICD-10-CM

## 2018-10-31 NOTE — Telephone Encounter (Signed)
Last Visit: 09/04/18 Next Visit: 02/05/19 Labs: 09/04/18 WNL Eye exam: 10/02/18 WNL   Okay to refill per Dr. Estanislado Pandy

## 2018-11-14 DIAGNOSIS — I890 Lymphedema, not elsewhere classified: Secondary | ICD-10-CM | POA: Diagnosis not present

## 2018-11-17 DIAGNOSIS — G4733 Obstructive sleep apnea (adult) (pediatric): Secondary | ICD-10-CM | POA: Diagnosis not present

## 2018-12-11 DIAGNOSIS — M2041 Other hammer toe(s) (acquired), right foot: Secondary | ICD-10-CM | POA: Diagnosis not present

## 2018-12-11 DIAGNOSIS — L600 Ingrowing nail: Secondary | ICD-10-CM | POA: Diagnosis not present

## 2018-12-11 DIAGNOSIS — M79672 Pain in left foot: Secondary | ICD-10-CM | POA: Diagnosis not present

## 2018-12-11 DIAGNOSIS — M2042 Other hammer toe(s) (acquired), left foot: Secondary | ICD-10-CM | POA: Diagnosis not present

## 2018-12-11 DIAGNOSIS — M79671 Pain in right foot: Secondary | ICD-10-CM | POA: Diagnosis not present

## 2018-12-15 DIAGNOSIS — I890 Lymphedema, not elsewhere classified: Secondary | ICD-10-CM | POA: Diagnosis not present

## 2018-12-23 DIAGNOSIS — Z79899 Other long term (current) drug therapy: Secondary | ICD-10-CM | POA: Diagnosis not present

## 2018-12-23 DIAGNOSIS — H353112 Nonexudative age-related macular degeneration, right eye, intermediate dry stage: Secondary | ICD-10-CM | POA: Diagnosis not present

## 2018-12-23 DIAGNOSIS — H353222 Exudative age-related macular degeneration, left eye, with inactive choroidal neovascularization: Secondary | ICD-10-CM | POA: Diagnosis not present

## 2018-12-23 DIAGNOSIS — H353124 Nonexudative age-related macular degeneration, left eye, advanced atrophic with subfoveal involvement: Secondary | ICD-10-CM | POA: Diagnosis not present

## 2018-12-23 DIAGNOSIS — G4733 Obstructive sleep apnea (adult) (pediatric): Secondary | ICD-10-CM | POA: Diagnosis not present

## 2018-12-30 ENCOUNTER — Ambulatory Visit: Payer: Medicare HMO | Attending: Adult Health | Admitting: Occupational Therapy

## 2018-12-30 ENCOUNTER — Other Ambulatory Visit: Payer: Self-pay

## 2018-12-30 DIAGNOSIS — I890 Lymphedema, not elsewhere classified: Secondary | ICD-10-CM | POA: Insufficient documentation

## 2018-12-30 NOTE — Therapy (Signed)
[place] MAIN [facility] SERVICES [address] [place], [place], [address] Phone: [phone]   Fax:  [phone]  Occupational Therapy Treatment Note and Progress Report: Lymphedema Care  Patient Details  Name: Taylor Mccall MRN: [id number] Date of Birth: 1945-03-31 Referring Provider (OT): Dorothyann Peng, NP   Encounter Date: 12/30/2018  OT End of Session - 12/30/18 1533    Visit Number  26    Number of Visits  36    Date for OT Re-Evaluation  09/24/18    OT Start Time  0100    OT Stop Time  0210    OT Time Calculation (min)  70 min    Activity Tolerance  Patient tolerated treatment well;No increased pain    Behavior During Therapy  WFL for tasks assessed/performed       Past Medical History:  Diagnosis Date  . Anxiety   . Blood transfusion without reported diagnosis   . Breast mass, left   . Cataract   . Depression   . Gait disturbance   . GERD (gastroesophageal reflux disease)   . Hypertension   . Lymphedema of both lower extremities    Seeing OT at [place] to Left Lower leg  . Obesity   . Osteoarthritis   . Rheumatoid arthritis(714.0)   . Sleep apnea    cpap  . Urinary incontinence   . Venous insufficiency     Past Surgical History:  Procedure Laterality Date  . ABDOMINAL HYSTERECTOMY    . BLADDER SURGERY     sling  . BLADDER SUSPENSION  2009  . BREAST REDUCTION SURGERY    . COLONOSCOPY    . ESOPHAGOGASTRODUODENOSCOPY    . EYE SURGERY    . REPLACEMENT TOTAL KNEE Left   . TOE SURGERY Left    Left great toe  . TOTAL HIP ARTHROPLASTY    . TOTAL KNEE ARTHROPLASTY Right 12/09/2013   Procedure: Right Total Knee Arthroplasty;  Surgeon: Newt Minion, MD;  Location: [place];  Service: Orthopedics;  Laterality: Right;  Marland Kitchen VESICOVAGINAL FISTULA CLOSURE W/ TAH      There were no vitals filed for this visit.  Subjective Assessment - 12/30/18 1318    Subjective   Taylor Mccall presents for OT  visit 26//36 to address BLE LE. She presents for follow along today and is unaccompanied. Pt reports she wears her compression stocks daily as instructed, uses her SPCD ~ every other day. Pt states she feels like lymphedema management is going well.    Patient is accompanied by:  Family member    Pertinent History  anxiety, depression gait disturbance, impaired transfers, HTN, OA, RA, OSA incontinence and CVI    Limitations  chronic leg pain and swelling, difficulty walking, decreased standing and walking tolwerance    Special Tests  positive Stemmer sign bilaterally, L>R    Pain Onset  More than a month ago          LYMPHEDEMA/ONCOLOGY QUESTIONNAIRE - 12/30/18 1529      Right Lower Extremity Lymphedema   Other  RLE limb volume from ankle to tibial tuberosity (A-D) measures 4803.56 mll.    Other  RLE A-D (ankle to tibial tuberosity  volume is increased by 5.8% since last measured on 8/225/20      Left Lower Extremity Lymphedema   Other  LLE A-D ( ankle to tibial tuberosity) limb volume measures  5589.78 ml.    Other  LLE  A-D limb volume  is inceased overall by 7.8% since last measured on 09/30/18.              OT Treatments/Exercises (OP) - 12/30/18 0001      ADLs   ADL Education Given  Yes      Manual Therapy   Manual Therapy  Edema management    Manual therapy comments  BLE comparative limb volumetrics             OT Education - 12/30/18 1528    Education Details  Continued skilled Pt/caregiver education  And LE ADL training throughout visit for lymphedema self care/ home program, including compression wrapping, compression garment and device wear/care, lymphatic pumping ther ex, simple self-MLD, and skin care. Discussed progress towards goals.    Person(s) Educated  Patient;Child(ren)    Methods  Explanation;Demonstration;Tactile cues;Verbal cues    Comprehension  Verbalized understanding;Returned demonstration;Verbal cues required;Tactile cues required;Need  further instruction          OT Long Term Goals - 12/30/18 1545      OT LONG TERM GOAL #1   Title  Pt will be able to apply  knee length, multi-layer, short stretch compression wraps (one leg at a time) using correct gradient techniques with modified independence (extra time)  to achieve optimal limb volume reduction, and to return affected limb , as closely as possible, to premorbid size and shape.    Baseline  dependent 08/05/18 : Achieved on time    Time  4    Period  Days    Status  Achieved      OT LONG TERM GOAL #2   Title  Pt to achieve no less than 10% limb volume reductions below knees bilaterally during Intensive Phase CDT to improve AROM for ADLs, safe ambulation and transfers, to reduce infection risk, and to limit LE progression.    Baseline  dependent 12/30/18: BLE volume increases today. see new goal below. 08/05/18: Excellent  LLE progress with 8% volume reduction measured below the knee today. 09/03/18: achieved and exceeded with 22.06% below the knee (A-D)    Time  12    Period  Weeks    Status  Partially Met      OT LONG TERM GOAL #3   Title  Pt will achieve  100% compliance with daily LE self-care program with min caregiver assistance throughout Intensive Phase CDT, including daily  skin care, lymphatic pumping therex, simple self-MLD and prescribe compression to ensure optimal limb volume reduction, to limit infection risk and to limit LE progression.    Baseline  dependent 08/05/18 : Achieved on time    Time  12    Period  Weeks    Status  Achieved      OT LONG TERM GOAL #4   Title  Pt will demonstrate modified independence using  assistive devices and extra time  during LE self-care training to don and doff properly fitting, custom compression garments/devices( on order from preferred DME vendor)for optimal LE self-management over time.     Baseline  dependent    Time  12    Period  Weeks    Status  Achieved      OT LONG TERM GOAL #5   Title  Self Management  Phase CDT: Pt will retain limb volume reduction with no greater than 3% increase over 6 months using strategies and tools   learned in OT during LE care.    Baseline  Min A  12/30/18 NOT MET . see new  goals below    Time  3    Period  Months    Status  New      Long Term Additional Goals   Additional Long Term Goals  Yes      OT LONG TERM GOAL #6   Title  Pt will achieve no less than 5% limb volume reductions bilaterally  using all learned LE self care home program protocols daily, including  SPCD, compression garments, ther ex and skin care to limit lymphedema progression, infection risk, and further functional decline.    Baseline  Mod A    Time  3    Period  Months    Status  New    Target Date  03/30/19      OT LONG TERM GOAL #7   Title  Pt will elevate BLE when sitting and watching tv 100% of the time to improve lymphatic circulation and limit lymphatic progression within 3 months    Baseline  Max A    Time  3    Period  Months    Status  New    Target Date  03/30/19      OT LONG TERM GOAL #8   Title  Pt will increase use of sequential pneumatic compression device (SPCD, Flexitouch "pump") to daily on alternating legs 100% of the time  in effort to improve lymphatic function, decrease limb volumes and limit lymphedema progression.    Baseline  Mod A    Time  3    Period  Days    Status  New    Target Date  03/30/19      OT LONG TERM GOAL  #9   TITLE  Pt will decrease weight by 5# by return visit in 3 months in effort do improve lymphatic function and limit LE progression.    Baseline  Max A    Time  3    Period  Months    Status  New    Target Date  03/30/19            Plan - 12/30/18 1534    Clinical Impression Statement  BLE comnparative limb volumetrics reveal RLE is increased in volume bu 5.8% and LLE is increased by 7.8% since last measured on 09/30/18. Pt and OT devised new goals with emphasis on decreasing these limb volumes to volumes achieved at end of  intensive phase CDT by 3 month F/U. Compression garments assessed and they are in good condition, but will need to be replaced in 3 months for optimal effectiveness. In the mean time Pt will increase comression by donning earlier each day. Pt admits she has not been using her sequential lymphedema "pump" as much as she was instructed. Her goal going foward is to incease SPCD use to 1 x q da,. Pt also admits that she qdoes not elevate legs when sitting. She watches TV much of the day in the kitchen and does not have a way to elevate correctly. PT and OT identiied goal to elevate whenever and where ever  she is sitting watcing TV. Pt will devise a way to elevate her legs in the kitchen and by 3 mo f/u she will achieve 100% elevation when sitting. Pt's last goal is to decrease her weight by 5# when she returns in effort to improve lymphatic circulation. Overall Pt is doing very well and remains motivated to manage lymphedema as best she can. Her skin is well hydrated and mobile. She clarly follows skin care  protocol dilligently. Pt agrees with plan to return ion 3 months when we will complete new measurements for custom compression garment replacements. We'll consider changing from Elvarex Soft to Classic Elvarex to improve containment. We'll evaluate progress towards new self management goals.    OT Occupational Profile and History  Comprehensive Assessment- Review of records and extensive additional review of physical, cognitive, psychosocial history related to current functional performance    Occupational performance deficits (Please refer to evaluation for details):  ADL's;Work;Other;IADL's;Rest and Sleep;Leisure;Social Participation   functional ambulation and transfers, body image, role  performance   Body Structure / Function / Physical Skills  ADL;Decreased knowledge of precautions;Edema;Obesity;ROM;Skin integrity;Scar mobility;Mobility;IADL;Gait    Psychosocial Skills  Coping  Strategies;Habits;Environmental  Adaptations    Rehab Potential  Good    Clinical Decision Making  Several treatment options, min-mod task modification necessary    Comorbidities Affecting Occupational Performance:  Presence of comorbidities impacting occupational performance    Comorbidities impacting occupational performance description:  see SUBJECTIVE for pertinent comorbidities    Modification or Assistance to Complete Evaluation   Min-Moderate modification of tasks or assist with assess necessary to complete eval    OT Frequency  3x / week    OT Duration  12 weeks    OT Treatment/Interventions  Self-care/ADL training;Therapeutic exercise;Energy conservation;Manual lymph drainage;Scar mobilization;Therapeutic activities;Patient/family education;Compression bandaging;Manual Therapy;DME and/or AE instruction    Plan  CCDT to include manual lymphatic drainage, skin care, comrpession wraps and custom garments once swelling is reduced, and therapeutic exercise for lymphatic pumping . Pt will be instructed in LE self care at all sessions in effort to limit LE progression and to facilitate retention of clinical gains over time.    Consulted and Agree with Plan of Care  Patient       Patient will benefit from skilled therapeutic intervention in order to improve the following deficits and impairments:   Body Structure / Function / Physical Skills: ADL, Decreased knowledge of precautions, Edema, Obesity, ROM, Skin integrity, Scar mobility, Mobility, IADL, Gait   Psychosocial Skills: Coping Strategies, Habits, Environmental  Adaptations   Visit Diagnosis: Lymphedema, not elsewhere classified - Plan: Ot plan of care cert/re-cert    Problem List Patient Active Problem List   Diagnosis Date Noted  . Routine general medical examination at a health care facility 12/11/2016  . Primary osteoarthritis of left hip 05/01/2016  . Autoimmune disease ([place]) 11/29/2015  . High risk medication use  11/29/2015  . Vitamin D deficiency 11/29/2015  . Macular degeneration 11/29/2015  . Bilateral lower extremity edema 10/26/2015  . H/O total knee replacement, bilateral 12/09/2013  . Breast mass, left 01/18/2011  . Obesity 12/08/2009  . Venous (peripheral) insufficiency 07/28/2009  . PAIN IN JOINT, ANKLE AND FOOT 01/12/2008  . Obstructive sleep apnea 08/29/2007  . GAIT DISTURBANCE 08/14/2007  . Depression 08/26/2006  . Essential hypertension 08/26/2006  . Primary osteoarthritis of both hands 08/26/2006  . Urinary incontinence 08/26/2006    Andrey Spearman, Taylor, OTR/L, [facility] 12/30/18 3:58 PM  [place] MAIN [facility] SERVICES [address] [place], [place], [address] Phone: [phone]   Fax:  [phone]  Name: DEXTER SAUSER MRN: [id number] Date of Birth: 1945-09-02

## 2018-12-31 DIAGNOSIS — L600 Ingrowing nail: Secondary | ICD-10-CM | POA: Diagnosis not present

## 2019-01-14 DIAGNOSIS — M25571 Pain in right ankle and joints of right foot: Secondary | ICD-10-CM | POA: Diagnosis not present

## 2019-01-14 DIAGNOSIS — I890 Lymphedema, not elsewhere classified: Secondary | ICD-10-CM | POA: Diagnosis not present

## 2019-01-14 DIAGNOSIS — L600 Ingrowing nail: Secondary | ICD-10-CM | POA: Diagnosis not present

## 2019-01-22 DIAGNOSIS — G4733 Obstructive sleep apnea (adult) (pediatric): Secondary | ICD-10-CM | POA: Diagnosis not present

## 2019-01-23 ENCOUNTER — Other Ambulatory Visit: Payer: Self-pay

## 2019-01-23 ENCOUNTER — Ambulatory Visit (INDEPENDENT_AMBULATORY_CARE_PROVIDER_SITE_OTHER): Payer: Medicare HMO | Admitting: Adult Health

## 2019-01-23 ENCOUNTER — Encounter: Payer: Self-pay | Admitting: Adult Health

## 2019-01-23 DIAGNOSIS — E669 Obesity, unspecified: Secondary | ICD-10-CM | POA: Diagnosis not present

## 2019-01-23 DIAGNOSIS — G4733 Obstructive sleep apnea (adult) (pediatric): Secondary | ICD-10-CM | POA: Diagnosis not present

## 2019-01-23 NOTE — Progress Notes (Signed)
@Patient  ID: Taylor Mccall, female    DOB: [birth year], 73 y.o.   MRN: [id number]  Chief Complaint  Patient presents with  . Follow-up    OSA    Referring provider: Dorothyann Peng, NP  HPI: 73 year old female followed for obstructive sleep apnea Past medical history significant for rheumatoid arthritis on Plaquenil, lymphedema  TEST/EVENTS :  NPSG 2009: AHI 21/hr.  Auto 2014: Optimal pressure 15cm  01/23/2019 Follow up : OSA  Patient presents for a 1 year follow-up for sleep apnea.  Patient is on nocturnal CPAP.  Patient says she continues to have trouble wearing her CPAP.  She says complains of extreme dry mouth and nose.  She says her mouth is so dry that she just cannot stand her mask.  She currently uses a full facemask.  Her pressures have been adjusted she is currently on CPAP 12 to 17 cm H2O.  Compliance is very poor on her download.  The days that she did wear was around 4 hours.  And her AHI was 1.5.  Patient education on importance of CPAP usage.  Educated on potential complications of untreated sleep apnea.  Patient says overall she has been feeling well.  She says she wants to try to use it.  We discussed use an alternative mask.  Allergies  Allergen Reactions  . Penicillins Rash    Immunization History  Administered Date(s) Administered  . Influenza Split 12/14/2010, 12/06/2012  . Influenza Whole 02/06/2004, 12/08/2009  . Influenza, High Dose Seasonal PF 10/26/2015, 12/11/2016, 10/07/2018  . Influenza,inj,Quad PF,6+ Mos 10/15/2013, 11/19/2014  . Influenza-Unspecified 10/07/2018  . Pneumococcal Conjugate-13 03/18/2013  . Pneumococcal Polysaccharide-23 06/30/2008, 10/26/2015  . Td 02/05/2005  . Tdap 12/14/2010    Past Medical History:  Diagnosis Date  . Anxiety   . Blood transfusion without reported diagnosis   . Breast mass, left   . Cataract   . Depression   . Gait disturbance   . GERD (gastroesophageal reflux disease)   . Hypertension   .  Lymphedema of both lower extremities    Seeing OT at [place] to Left Lower leg  . Obesity   . Osteoarthritis   . Rheumatoid arthritis(714.0)   . Sleep apnea    cpap  . Urinary incontinence   . Venous insufficiency     Tobacco History: Social History   Tobacco Use  Smoking Status Former Smoker  . Packs/day: 0.30  . Years: 10.00  . Pack years: 3.00  . Types: Cigarettes  . Quit date: 02/05/1978  . Years since quitting: 40.9  Smokeless Tobacco Never Used  Tobacco Comment   over 25 years ago...pt doesnt remember when she quit.    Counseling given: Not Answered Comment: over 25 years ago...pt doesnt remember when she quit.    Outpatient Medications Prior to Visit  Medication Sig Dispense Refill  . aspirin 81 MG tablet Take 81 mg by mouth daily.    . carboxymethylcellulose (REFRESH TEARS) 0.5 % SOLN Place 2 drops into the right eye as needed (for itchy eyes).    Marland Kitchen erythromycin (ERY-TAB) 500 MG EC tablet Take 500-1,000 mg by mouth 2 (two) times daily as needed (for dental procedures). Take 1000 mg by mouth 1 hour prior to dental appointment and take 500 mg by mouth 6 hours after dental work.    . furosemide (LASIX) 20 MG tablet Take 20 mg by mouth 2 (two) times daily.    . hydroxychloroquine (PLAQUENIL) 200 MG tablet Take 1  tablet by mouth twice daily 180 tablet 0  . losartan (COZAAR) 25 MG tablet Take 25 mg by mouth daily.    . Multiple Vitamins-Minerals (PRESERVISION AREDS 2) CAPS Take 2 capsules by mouth daily.    . naproxen sodium (ANAPROX) 220 MG tablet Take 220 mg by mouth 2 (two) times daily as needed (for pain).    . NON FORMULARY 1 each by Other route See admin instructions. Use CPAP nightly.    . Omega-3 Fatty Acids (FISH OIL) 600 MG CAPS Take 1 capsule by mouth 2 (two) times daily.    . pantoprazole (PROTONIX) 40 MG tablet TAKE ONE TABLET BY MOUTH ONCE DAILY BEFORE BREAKFAST 90 tablet 3  . sertraline (ZOLOFT) 100 MG tablet Take 1 tablet (100  mg total) by mouth every morning. 90 tablet 3  . TRAVATAN Z 0.004 % SOLN ophthalmic solution INSTILL 1 DROP INTO EACH EYE ONCE DAILY AT NIGHT  4  . vitamin B-12 (CYANOCOBALAMIN) 1000 MCG tablet Take 1,000 mcg by mouth daily.    . Vitamin D, Ergocalciferol, (DRISDOL) 1.25 MG (50000 UT) CAPS capsule Take 1 capsule (50,000 Units total) by mouth every 7 (seven) days. 12 capsule 0  . vitamin E 400 UNIT capsule Take 400 Units by mouth daily.    . furosemide (LASIX) 20 MG tablet Take 1 tablet (20 mg total) by mouth 2 (two) times daily. 1 by mouth twice a day 180 tablet 3  . potassium chloride SA (K-DUR,KLOR-CON) 20 MEQ tablet Take 2 tablets (40 mEq total) by mouth every morning. 180 tablet 3   No facility-administered medications prior to visit.     Review of Systems:   Constitutional:   No  weight loss, night sweats,  Fevers, chills, fatigue, or  lassitude.  HEENT:   No headaches,  Difficulty swallowing,  Tooth/dental problems, or  Sore throat,                No sneezing, itching, ear ache, nasal congestion, post nasal drip,  Dry mouth +  CV:  No chest pain,  Orthopnea, PND,  anasarca, dizziness, palpitations, syncope.   GI  No heartburn, indigestion, abdominal pain, nausea, vomiting, diarrhea, change in bowel habits, loss of appetite, bloody stools.   Resp: No shortness of breath with exertion or at rest.  No excess mucus, no productive cough,  No non-productive cough,  No coughing up of blood.  No change in color of mucus.  No wheezing.  No chest wall deformity  Skin: no rash or lesions.  GU: no dysuria, change in color of urine, no urgency or frequency.  No flank pain, no hematuria   MS:  No joint pain or swelling.  No decreased range of motion.  No back pain.    Physical Exam  BP 130/78 (BP Location: Left Arm, Cuff Size: Large)   Pulse 80   Temp (!) 97 F (36.1 C) (Temporal)   Ht 5\' 10"  (1.778 m)   Wt 274 lb 9.6 oz (124.6 kg)   SpO2 97%   BMI 39.40 kg/m   GEN: A/Ox3;  pleasant , NAD, BMI 39   HEENT:  [place]/AT,  , NOSE-clear, THROAT-clear, no lesions, no postnasal drip or exudate noted.  Class II-III MP airway  NECK:  Supple w/ fair ROM; no JVD; normal carotid impulses w/o bruits; no thyromegaly or nodules palpated; no lymphadenopathy.    RESP  Clear  P & A; w/o, wheezes/ rales/ or rhonchi. no accessory muscle use, no dullness to percussion  CARD:  RRR, no m/r/g, 2+ peripheral edema, pulses intact, no cyanosis or clubbing. Bilateral lymphedema of the lower extremities  GI:   Soft & nt; nml bowel sounds; no organomegaly or masses detected.   Musco: Warm bil, no deformities or joint swelling noted.   Neuro: alert, no focal deficits noted.    Skin: Warm, no lesions or rashes    Lab Results:  CBC  BNP No results found for: BNP  ProBNP  Imaging: No results found.    No flowsheet data found.  No results found for: NITRICOXIDE      Assessment & Plan:   Obstructive sleep apnea Needs improved compliance/usage.  Patient education given.  Will change to a DreamWear nasal mask to see if this is improves her comfort level.  Add in saline spray and saline gel.  Plan  Patient Instructions  May use Saline nasal spray Twice daily   Use Saline nasal gel (AYR ) At bedtime   Try to wear CPAP each night for at least 4hr .  Download in 6 weeks .  Trial Dreamwear nasal mask .  Work on healthy weight loss.  Do not drive if sleepy .  Follow up in 1 year and As needed  With Dr. Elsworth Soho .      Obesity Healthy weight loss     Rexene Edison, NP 01/23/2019

## 2019-01-23 NOTE — Assessment & Plan Note (Signed)
Needs improved compliance/usage.  Patient education given.  Will change to a DreamWear nasal mask to see if this is improves her comfort level.  Add in saline spray and saline gel.  Plan  Patient Instructions  May use Saline nasal spray Twice daily   Use Saline nasal gel (AYR ) At bedtime   Try to wear CPAP each night for at least 4hr .  Download in 6 weeks .  Trial Dreamwear nasal mask .  Work on healthy weight loss.  Do not drive if sleepy .  Follow up in 1 year and As needed  With Dr. Elsworth Soho .

## 2019-01-23 NOTE — Assessment & Plan Note (Signed)
Healthy weight loss 

## 2019-01-23 NOTE — Patient Instructions (Signed)
May use Saline nasal spray Twice daily   Use Saline nasal gel (AYR ) At bedtime   Try to wear CPAP each night for at least 4hr .  Download in 6 weeks .  Trial Dreamwear nasal mask .  Work on healthy weight loss.  Do not drive if sleepy .  Follow up in 1 year and As needed  With Dr. Elsworth Soho .

## 2019-01-28 DIAGNOSIS — L600 Ingrowing nail: Secondary | ICD-10-CM | POA: Diagnosis not present

## 2019-02-02 ENCOUNTER — Telehealth: Payer: Self-pay | Admitting: Rheumatology

## 2019-02-02 NOTE — Telephone Encounter (Signed)
Called patient to reschedule her appointment on 02/09/19 to a virtual appointment.  Patient did not want a virtual and rescheduled for 03/18/19 at 9:40 am.  Patient states she is due for labwork, but would prefer to wait until her appointment.  Patient states "if Dr. Estanislado Pandy insists she have her labwork before her appointment" it can be sent to [facility] on [facility], but patient states she lives in [place] and "really doesn't want to drive that far just to have labs."

## 2019-02-03 ENCOUNTER — Telehealth: Payer: Self-pay | Admitting: Rheumatology

## 2019-02-03 DIAGNOSIS — M359 Systemic involvement of connective tissue, unspecified: Secondary | ICD-10-CM

## 2019-02-03 MED ORDER — HYDROXYCHLOROQUINE SULFATE 200 MG PO TABS: 200.0000 mg | ORAL_TABLET | Freq: Two times a day (BID) | ORAL | 0 refills | Status: DC

## 2019-02-03 NOTE — Telephone Encounter (Signed)
Labs: 09/04/2018 Next visit: 03/18/2019  Patient is on PLQ and would like to wait until her scheduled appointment on 03/18/2019 for labs. Please advise.

## 2019-02-03 NOTE — Telephone Encounter (Signed)
Her labs have been stable.  We can wait until February for lab work.

## 2019-02-03 NOTE — Telephone Encounter (Signed)
Last Visit: 09/04/2018 Next Visit: 03/18/2019 Labs: 09/04/2018 CBC and CMP WNL.  Eye exam: 10/02/2018  Okay to refill per Dr. Estanislado Pandy.

## 2019-02-03 NOTE — Telephone Encounter (Signed)
Patient called requesting prescription refill of Plaquenil to be sent to her new pharmacy [facility] at [address] in [place].

## 2019-02-03 NOTE — Telephone Encounter (Signed)
Advised patient Her labs have been stable.  We can wait until February for lab work. Patient verbalized understanding.

## 2019-02-05 ENCOUNTER — Ambulatory Visit: Payer: Medicare HMO | Admitting: Physician Assistant

## 2019-02-09 ENCOUNTER — Ambulatory Visit: Payer: Medicare HMO | Admitting: Rheumatology

## 2019-02-10 DIAGNOSIS — H401132 Primary open-angle glaucoma, bilateral, moderate stage: Secondary | ICD-10-CM | POA: Diagnosis not present

## 2019-02-12 DIAGNOSIS — L600 Ingrowing nail: Secondary | ICD-10-CM | POA: Diagnosis not present

## 2019-02-14 DIAGNOSIS — I890 Lymphedema, not elsewhere classified: Secondary | ICD-10-CM | POA: Diagnosis not present

## 2019-02-22 DIAGNOSIS — G4733 Obstructive sleep apnea (adult) (pediatric): Secondary | ICD-10-CM | POA: Diagnosis not present

## 2019-02-24 DIAGNOSIS — L600 Ingrowing nail: Secondary | ICD-10-CM | POA: Diagnosis not present

## 2019-03-10 DIAGNOSIS — L600 Ingrowing nail: Secondary | ICD-10-CM | POA: Diagnosis not present

## 2019-03-11 DIAGNOSIS — Z1231 Encounter for screening mammogram for malignant neoplasm of breast: Secondary | ICD-10-CM | POA: Diagnosis not present

## 2019-03-11 LAB — HM MAMMOGRAPHY

## 2019-03-13 ENCOUNTER — Encounter: Payer: Self-pay | Admitting: Adult Health

## 2019-03-16 NOTE — Progress Notes (Signed)
Office Visit Note  Patient: Taylor Mccall             Date of Birth: Sep 08, 1945           MRN: [id number]             PCP: Dorothyann Peng, NP Referring: Dorothyann Peng, NP Visit Date: 03/18/2019 Occupation: @GUAROCC @  Subjective:  Pain in both hands   History of Present Illness: Taylor Mccall is a 74 y.o. female with history of autoimmune disease and osteoarthritis.  Patient is taking Plaquenil 200 mg 1 tablet twice daily.  She denies any signs or symptoms of an autoimmune disease flare recently.  She denies any recent rashes, oral lesions or ulcerations, symptoms of Raynaud's, photosensitivity, enlarged lymph nodes, shortness of breath, or chest pain.  She continues to have chronic pain in both hands and bilateral knee replacements.  She had a right shoulder joint cortisone injection on 09/04/2018 which provided significant pain relief.  She continues to have significant edema in bilateral lower extremities.  She has established care at the lymphedema clinic and has been using lymphedema boots at home on a daily basis.  She continues to take Lasix as prescribed.    Activities of Daily Living:  Patient reports morning stiffness for 1 hour.   Patient Reports nocturnal pain.  Difficulty dressing/grooming: Reports Difficulty climbing stairs: Denies Difficulty getting out of chair: Reports Difficulty using hands for taps, buttons, cutlery, and/or writing: Reports  Review of Systems  Constitutional: Positive for fatigue.  HENT: Positive for mouth dryness. Negative for mouth sores and nose dryness.   Eyes: Positive for dryness. Negative for pain and visual disturbance.  Respiratory: Negative for cough, hemoptysis, shortness of breath and difficulty breathing.   Cardiovascular: Positive for swelling in legs/feet. Negative for chest pain, palpitations and hypertension.  Gastrointestinal: Negative for blood in stool, constipation and diarrhea.  Endocrine: Negative for increased  urination.  Genitourinary: Negative for difficulty urinating and painful urination.  Musculoskeletal: Positive for arthralgias, joint pain and morning stiffness. Negative for joint swelling, myalgias, muscle weakness, muscle tenderness and myalgias.  Skin: Negative for color change, pallor, rash, hair loss, nodules/bumps, skin tightness, ulcers and sensitivity to sunlight.  Allergic/Immunologic: Negative for susceptible to infections.  Neurological: Negative for numbness, headaches, memory loss and weakness.  Hematological: Negative for swollen glands.  Psychiatric/Behavioral: Positive for sleep disturbance. Negative for depressed mood and confusion. The patient is not nervous/anxious.     PMFS History:  Patient Active Problem List   Diagnosis Date Noted  . Routine general medical examination at a health care facility 12/11/2016  . Primary osteoarthritis of left hip 05/01/2016  . Autoimmune disease ([place]) 11/29/2015  . High risk medication use 11/29/2015  . Vitamin D deficiency 11/29/2015  . Macular degeneration 11/29/2015  . Bilateral lower extremity edema 10/26/2015  . H/O total knee replacement, bilateral 12/09/2013  . Breast mass, left 01/18/2011  . Obesity 12/08/2009  . Venous (peripheral) insufficiency 07/28/2009  . PAIN IN JOINT, ANKLE AND FOOT 01/12/2008  . Obstructive sleep apnea 08/29/2007  . GAIT DISTURBANCE 08/14/2007  . Depression 08/26/2006  . Essential hypertension 08/26/2006  . Primary osteoarthritis of both hands 08/26/2006  . Urinary incontinence 08/26/2006    Past Medical History:  Diagnosis Date  . Anxiety   . Blood transfusion without reported diagnosis   . Breast mass, left   . Cataract   . Depression   . Gait disturbance   . GERD (gastroesophageal reflux disease)   .  Hypertension   . Lymphedema of both lower extremities    Seeing OT at [facility] to Left Lower leg  . Obesity   . Osteoarthritis   . Rheumatoid  arthritis(714.0)   . Sleep apnea    cpap  . Urinary incontinence   . Venous insufficiency     Family History  Problem Relation Age of Onset  . Diabetes Other   . Stroke Other   . Asthma Brother   . Heart disease Brother   . Coronary artery disease Father   . Skin cancer Father   . Heart disease Father   . Coronary artery disease Brother   . Colon cancer Neg Hx   . Esophageal cancer Neg Hx   . Rectal cancer Neg Hx   . Stomach cancer Neg Hx    Past Surgical History:  Procedure Laterality Date  . ABDOMINAL HYSTERECTOMY    . BLADDER SURGERY     sling  . BLADDER SUSPENSION  2009  . BREAST REDUCTION SURGERY    . COLONOSCOPY    . ESOPHAGOGASTRODUODENOSCOPY    . EYE SURGERY    . REPLACEMENT TOTAL KNEE Left   . TOE SURGERY Left    Left great toe  . toenail removal Right    all 5 toenails  . TOTAL HIP ARTHROPLASTY    . TOTAL KNEE ARTHROPLASTY Right 12/09/2013   Procedure: Right Total Knee Arthroplasty;  Surgeon: Newt Minion, MD;  Location: [facility];  Service: Orthopedics;  Laterality: Right;  Marland Kitchen VESICOVAGINAL FISTULA CLOSURE W/ TAH     Social History   Social History Narrative  . Not on file   Immunization History  Administered Date(s) Administered  . Influenza Split 12/14/2010, 12/06/2012  . Influenza Whole 02/06/2004, 12/08/2009  . Influenza, High Dose Seasonal PF 10/26/2015, 12/11/2016, 10/07/2018  . Influenza,inj,Quad PF,6+ Mos 10/15/2013, 11/19/2014  . Influenza-Unspecified 10/07/2018  . Pneumococcal Conjugate-13 03/18/2013  . Pneumococcal Polysaccharide-23 06/30/2008, 10/26/2015  . Td 02/05/2005  . Tdap 12/14/2010     Objective: Vital Signs: BP (!) 149/91 (BP Location: Right Wrist, Patient Position: Sitting, Cuff Size: Normal)   Pulse 83   Resp 15   Ht 5\' 10"  (1.778 m)   Wt 281 lb (127.5 kg)   BMI 40.32 kg/m    Physical Exam Vitals and nursing note reviewed.  Constitutional:      Appearance: She is well-developed.  HENT:     Head: Normocephalic and  atraumatic.  Eyes:     Conjunctiva/sclera: Conjunctivae normal.  Pulmonary:     Effort: Pulmonary effort is normal.     Breath sounds: Normal breath sounds.  Abdominal:     General: Bowel sounds are normal.     Palpations: Abdomen is soft.  Musculoskeletal:     Cervical back: Normal range of motion.  Lymphadenopathy:     Cervical: No cervical adenopathy.  Skin:    General: Skin is warm and dry.     Capillary Refill: Capillary refill takes less than 2 seconds.  Neurological:     Mental Status: She is alert and oriented to person, place, and time.  Psychiatric:        Behavior: Behavior normal.      Musculoskeletal Exam: C-spine limited range of motion.  Thoracic and lumbar spine good range of motion.  No midline spinal tenderness.  No SI joint tenderness.  Shoulder joint, elbow joints, wrist joints have good range of motion with no discomfort.  She has PIP and DIP synovial  thickening consistent with osteoarthritis of both hands.  She has subluxation of several DIP joints.  Hip joints have good range of motion with no discomfort.  Bilateral knee replacements have good range of motion with no warmth or effusion.  She has severe lymphedema in bilateral LE.   CDAI Exam: CDAI Score: -- Patient Global: --; Provider Global: -- Swollen: --; Tender: -- Joint Exam 03/18/2019   No joint exam has been documented for this visit   There is currently no information documented on the homunculus. Go to the Rheumatology activity and complete the homunculus joint exam.  Investigation: No additional findings.  Imaging: No results found.  Recent Labs: Lab Results  Component Value Date   WBC 5.8 09/04/2018   HGB 12.9 09/04/2018   PLT 303 09/04/2018   NA 143 09/04/2018   K 3.9 09/04/2018   CL 107 09/04/2018   CO2 29 09/04/2018   GLUCOSE 94 09/04/2018   BUN 11 09/04/2018   CREATININE 0.84 09/04/2018   BILITOT 0.7 09/04/2018   ALKPHOS 107 02/11/2018   AST 14 09/04/2018   ALT 9  09/04/2018   PROT 7.2 09/04/2018   ALBUMIN 4.0 02/11/2018   CALCIUM 9.8 09/04/2018   GFRAA 79 09/04/2018    Speciality Comments: PLQ eye exam: 02/10/2019 WNL @ [facility]. Follow up in 6 months.  Procedures:  No procedures performed Allergies: Penicillins   DEXA on 09/17/18 WNL.  Repeat in 5 years.    Assessment / Plan:     Visit Diagnoses: Autoimmune disease ([place]) -  +ANA, +RNP: She has not had any signs or symptoms of an autoimmune disease flare recently.  She is clinically doing well on Plaquenil 2 mg 1 tablet by mouth twice daily.  She continues to have chronic pain in bilateral hands and bilateral knee replacements but no synovitis or joint inflammation was noted on exam today.  She has not had any recent rashes, photosensitivity, symptoms of Raynaud's, oral nasal ulcerations, sicca symptoms, shortness of breath, or chest pain.  No Malar rash was noted.  No digital ulcerations or signs of gangrene were noted.  She will continue taking Plaquenil as prescribed.  She does not need any refills at this time.  We will check autoimmune lab work today.  She was advised to notify us if she develops any new or worsening symptoms.  She will follow-up in the office in 5 months.- Plan: CBC with Differential/Platelet, COMPLETE METABOLIC PANEL WITH GFR, Urinalysis, Routine w reflex microscopic, Anti-DNA antibody, double-stranded, C3 and C4, VITAMIN D 25 Hydroxy (Vit-D Deficiency, Fractures), Sedimentation rate  High risk medication use - Plaquenil 200 mg 1 tablet by mouth twice daily.  PLQ eye exam: 02/10/2019 WNL @ [facility]. Follow up in 6 months.  CBC and CMP will be drawn today to monitor for drug toxicity.- Plan: CBC with Differential/Platelet, COMPLETE METABOLIC PANEL WITH GFR  Primary osteoarthritis of both hands: She has PIP and DIP synovial thickening consistent with osteoarthritis of both hands.  She is subluxation of several DIP joints.  She experiences chronic pain in both  hands.  She has no tenderness or synovitis on exam today.  Joint protection and muscle strengthening were discussed.  Chronic right shoulder pain: She has good range of motion on exam.  She had a right shoulder joint cortisone injection on 09/04/2018 which provided significant pain relief.  H/O total knee replacement, bilateral: Chronic pain.  She has good range of motion with no warmth or effusion.  Primary osteoarthritis of left  hip: She has good ROM with no discomfort.   Vitamin D deficiency -Vitamin D level will be checked today.  Plan: VITAMIN D 25 Hydroxy (Vit-D Deficiency, Fractures)  Other medical conditions are listed as follows  History of macular degeneration  History of sleep apnea  History of depression  Pedal edema  Orders: Orders Placed This Encounter  Procedures  . CBC with Differential/Platelet  . COMPLETE METABOLIC PANEL WITH GFR  . Urinalysis, Routine w reflex microscopic  . Anti-DNA antibody, double-stranded  . C3 and C4  . VITAMIN D 25 Hydroxy (Vit-D Deficiency, Fractures)  . Sedimentation rate   No orders of the defined types were placed in this encounter.     Follow-Up Instructions: Return in about 6 months (around 09/15/2019) for Autoimmune Disease, Osteoarthritis.   Ofilia Neas, PA-C  Note - This record has been created using Dragon software.  Chart creation errors have been sought, but may not always  have been located. Such creation errors do not reflect on  the standard of medical care.

## 2019-03-17 DIAGNOSIS — I890 Lymphedema, not elsewhere classified: Secondary | ICD-10-CM | POA: Diagnosis not present

## 2019-03-18 ENCOUNTER — Other Ambulatory Visit: Payer: Self-pay

## 2019-03-18 ENCOUNTER — Encounter: Payer: Self-pay | Admitting: Physician Assistant

## 2019-03-18 ENCOUNTER — Ambulatory Visit (INDEPENDENT_AMBULATORY_CARE_PROVIDER_SITE_OTHER): Payer: Medicare HMO | Admitting: Physician Assistant

## 2019-03-18 VITALS — BP 149/91 | HR 83 | Resp 15 | Ht 70.0 in | Wt 281.0 lb

## 2019-03-18 DIAGNOSIS — Z8669 Personal history of other diseases of the nervous system and sense organs: Secondary | ICD-10-CM | POA: Diagnosis not present

## 2019-03-18 DIAGNOSIS — R600 Localized edema: Secondary | ICD-10-CM | POA: Diagnosis not present

## 2019-03-18 DIAGNOSIS — M19041 Primary osteoarthritis, right hand: Secondary | ICD-10-CM

## 2019-03-18 DIAGNOSIS — R69 Illness, unspecified: Secondary | ICD-10-CM | POA: Diagnosis not present

## 2019-03-18 DIAGNOSIS — Z96653 Presence of artificial knee joint, bilateral: Secondary | ICD-10-CM

## 2019-03-18 DIAGNOSIS — M359 Systemic involvement of connective tissue, unspecified: Secondary | ICD-10-CM

## 2019-03-18 DIAGNOSIS — M1612 Unilateral primary osteoarthritis, left hip: Secondary | ICD-10-CM | POA: Diagnosis not present

## 2019-03-18 DIAGNOSIS — M25511 Pain in right shoulder: Secondary | ICD-10-CM | POA: Diagnosis not present

## 2019-03-18 DIAGNOSIS — E559 Vitamin D deficiency, unspecified: Secondary | ICD-10-CM

## 2019-03-18 DIAGNOSIS — Z79899 Other long term (current) drug therapy: Secondary | ICD-10-CM

## 2019-03-18 DIAGNOSIS — Z8659 Personal history of other mental and behavioral disorders: Secondary | ICD-10-CM

## 2019-03-18 DIAGNOSIS — M19042 Primary osteoarthritis, left hand: Secondary | ICD-10-CM

## 2019-03-18 DIAGNOSIS — G8929 Other chronic pain: Secondary | ICD-10-CM

## 2019-03-18 NOTE — Patient Instructions (Signed)

## 2019-03-19 LAB — CBC WITH DIFFERENTIAL/PLATELET
Absolute Monocytes: 324 cells/uL (ref 200–950)
Basophils Absolute: 52 cells/uL (ref 0–200)
Basophils Relative: 1.3 %
Eosinophils Absolute: 292 cells/uL (ref 15–500)
Eosinophils Relative: 7.3 %
HCT: 36.4 % (ref 35.0–45.0)
Hemoglobin: 11.8 g/dL (ref 11.7–15.5)
Lymphs Abs: 928 cells/uL (ref 850–3900)
MCH: 28.6 pg (ref 27.0–33.0)
MCHC: 32.4 g/dL (ref 32.0–36.0)
MCV: 88.3 fL (ref 80.0–100.0)
MPV: 10.8 fL (ref 7.5–12.5)
Monocytes Relative: 8.1 %
Neutro Abs: 2404 cells/uL (ref 1500–7800)
Neutrophils Relative %: 60.1 %
Platelets: 247 10*3/uL (ref 140–400)
RBC: 4.12 10*6/uL (ref 3.80–5.10)
RDW: 12.3 % (ref 11.0–15.0)
Total Lymphocyte: 23.2 %
WBC: 4 10*3/uL (ref 3.8–10.8)

## 2019-03-19 LAB — COMPLETE METABOLIC PANEL WITH GFR
AG Ratio: 1.7 (calc) (ref 1.0–2.5)
ALT: 10 U/L (ref 6–29)
AST: 14 U/L (ref 10–35)
Albumin: 4 g/dL (ref 3.6–5.1)
Alkaline phosphatase (APISO): 107 U/L (ref 37–153)
BUN: 9 mg/dL (ref 7–25)
CO2: 28 mmol/L (ref 20–32)
Calcium: 9.5 mg/dL (ref 8.6–10.4)
Chloride: 107 mmol/L (ref 98–110)
Creat: 0.7 mg/dL (ref 0.60–0.93)
GFR, Est African American: 98 mL/min/{1.73_m2} (ref >=60)
GFR, Est Non African American: 85 mL/min/{1.73_m2} (ref >=60)
Globulin: 2.3 g/dL (calc) (ref 1.9–3.7)
Glucose, Bld: 80 mg/dL (ref 65–99)
Potassium: 4.1 mmol/L (ref 3.5–5.3)
Sodium: 142 mmol/L (ref 135–146)
Total Bilirubin: 0.6 mg/dL (ref 0.2–1.2)
Total Protein: 6.3 g/dL (ref 6.1–8.1)

## 2019-03-19 LAB — URINALYSIS, ROUTINE W REFLEX MICROSCOPIC
Bilirubin Urine: NEGATIVE
Glucose, UA: NEGATIVE
Hgb urine dipstick: NEGATIVE
Leukocytes,Ua: NEGATIVE
Nitrite: NEGATIVE
Protein, ur: NEGATIVE
Specific Gravity, Urine: 1.025 (ref 1.001–1.03)
pH: 5.5 (ref 5.0–8.0)

## 2019-03-19 LAB — ANTI-DNA ANTIBODY, DOUBLE-STRANDED: ds DNA Ab: 2 IU/mL

## 2019-03-19 LAB — C3 AND C4
C3 Complement: 116 mg/dL (ref 83–193)
C4 Complement: 31 mg/dL (ref 15–57)

## 2019-03-19 LAB — SEDIMENTATION RATE: Sed Rate: 6 mm/h (ref 0–30)

## 2019-03-19 LAB — VITAMIN D 25 HYDROXY (VIT D DEFICIENCY, FRACTURES): Vit D, 25-Hydroxy: 25 ng/mL — ABNORMAL LOW (ref 30–100)

## 2019-03-19 NOTE — Progress Notes (Signed)
DsDNA is negative.

## 2019-03-19 NOTE — Progress Notes (Signed)
Vitamin D is low-25.  Please notify patient and send in vitamin D 50,000 units by mouth once weekly x3 months.  Recheck in 3 months.  CBC and CMP WNL.  Complements WNL.  ESR WNL.  UA reveals trace ketones.

## 2019-03-20 ENCOUNTER — Telehealth: Payer: Self-pay | Admitting: Rheumatology

## 2019-03-20 DIAGNOSIS — H409 Unspecified glaucoma: Secondary | ICD-10-CM | POA: Diagnosis not present

## 2019-03-20 DIAGNOSIS — I739 Peripheral vascular disease, unspecified: Secondary | ICD-10-CM | POA: Diagnosis not present

## 2019-03-20 DIAGNOSIS — R69 Illness, unspecified: Secondary | ICD-10-CM | POA: Diagnosis not present

## 2019-03-20 DIAGNOSIS — E559 Vitamin D deficiency, unspecified: Secondary | ICD-10-CM

## 2019-03-20 DIAGNOSIS — K219 Gastro-esophageal reflux disease without esophagitis: Secondary | ICD-10-CM | POA: Diagnosis not present

## 2019-03-20 DIAGNOSIS — M1990 Unspecified osteoarthritis, unspecified site: Secondary | ICD-10-CM | POA: Diagnosis not present

## 2019-03-20 DIAGNOSIS — G4730 Sleep apnea, unspecified: Secondary | ICD-10-CM | POA: Diagnosis not present

## 2019-03-20 DIAGNOSIS — I10 Essential (primary) hypertension: Secondary | ICD-10-CM | POA: Diagnosis not present

## 2019-03-20 DIAGNOSIS — R32 Unspecified urinary incontinence: Secondary | ICD-10-CM | POA: Diagnosis not present

## 2019-03-20 DIAGNOSIS — M069 Rheumatoid arthritis, unspecified: Secondary | ICD-10-CM | POA: Diagnosis not present

## 2019-03-20 MED ORDER — VITAMIN D (ERGOCALCIFEROL) 1.25 MG (50000 UNIT) PO CAPS: 50000.0000 [IU] | ORAL_CAPSULE | ORAL | 0 refills | Status: DC

## 2019-03-20 NOTE — Telephone Encounter (Signed)
Advised patient Vitamin D is low-25. advised patient and sent in vitamin D 50,000 units by mouth once weekly x3 months. Recheck in 3 months. CBC and CMP WNL. Complements WNL. ESR WNL. UA reveals trace ketones. DsDNA is negative.   Patient verbalized understanding. Prescription sent to the pharmacy and future lab order has been placed.

## 2019-03-20 NOTE — Telephone Encounter (Signed)
Patient left a voicemail stating she was returning a call from our office.

## 2019-03-30 DIAGNOSIS — G4733 Obstructive sleep apnea (adult) (pediatric): Secondary | ICD-10-CM | POA: Diagnosis not present

## 2019-03-31 ENCOUNTER — Encounter: Payer: Medicare HMO | Admitting: Occupational Therapy

## 2019-04-14 DIAGNOSIS — I890 Lymphedema, not elsewhere classified: Secondary | ICD-10-CM | POA: Diagnosis not present

## 2019-04-20 ENCOUNTER — Other Ambulatory Visit: Payer: Self-pay | Admitting: Adult Health

## 2019-04-22 NOTE — Telephone Encounter (Signed)
30 DAY SUPPLY SENT TO THE PHARMACY BY E-SCRIBE.  PT DUE FOR CPX. 

## 2019-04-27 ENCOUNTER — Other Ambulatory Visit: Payer: Self-pay | Admitting: Rheumatology

## 2019-04-27 DIAGNOSIS — M359 Systemic involvement of connective tissue, unspecified: Secondary | ICD-10-CM

## 2019-04-27 DIAGNOSIS — G4733 Obstructive sleep apnea (adult) (pediatric): Secondary | ICD-10-CM | POA: Diagnosis not present

## 2019-04-27 NOTE — Telephone Encounter (Signed)
Last Visit: 03/18/19 Next Visit: 09/15/19 Labs: 03/18/19 CBC and CMP WNL PLQ eye exam: 02/10/2019 WNL  Current Dose per office note on 03/18/19: Plaquenil 200 mg 1 tablet by mouth twice daily  Okay to refill per Dr. Estanislado Pandy

## 2019-05-15 ENCOUNTER — Other Ambulatory Visit: Payer: Self-pay | Admitting: Adult Health

## 2019-05-15 DIAGNOSIS — I890 Lymphedema, not elsewhere classified: Secondary | ICD-10-CM | POA: Diagnosis not present

## 2019-05-15 NOTE — Telephone Encounter (Signed)
SENT TO THE PHARMACY BY E-SCRIBE FOR 90 DAYS.  PT HAS UPCOMING CPX. 

## 2019-05-28 DIAGNOSIS — G4733 Obstructive sleep apnea (adult) (pediatric): Secondary | ICD-10-CM | POA: Diagnosis not present

## 2019-06-11 ENCOUNTER — Other Ambulatory Visit: Payer: Self-pay | Admitting: Adult Health

## 2019-06-11 NOTE — Telephone Encounter (Signed)
Denied  Needs appointment

## 2019-06-12 ENCOUNTER — Other Ambulatory Visit: Payer: Self-pay | Admitting: Rheumatology

## 2019-06-12 DIAGNOSIS — M25571 Pain in right ankle and joints of right foot: Secondary | ICD-10-CM | POA: Diagnosis not present

## 2019-06-12 DIAGNOSIS — L03031 Cellulitis of right toe: Secondary | ICD-10-CM | POA: Diagnosis not present

## 2019-06-12 NOTE — Telephone Encounter (Signed)
Left message to advise patient we will need her to have her Vitamin D rechecked prior to refilling.

## 2019-06-14 DIAGNOSIS — I890 Lymphedema, not elsewhere classified: Secondary | ICD-10-CM | POA: Diagnosis not present

## 2019-06-15 ENCOUNTER — Other Ambulatory Visit: Payer: Self-pay

## 2019-06-15 DIAGNOSIS — E559 Vitamin D deficiency, unspecified: Secondary | ICD-10-CM | POA: Diagnosis not present

## 2019-06-15 LAB — VITAMIN D 25 HYDROXY (VIT D DEFICIENCY, FRACTURES): Vit D, 25-Hydroxy: 44 ng/mL (ref 30–100)

## 2019-06-16 NOTE — Progress Notes (Signed)
Vitamin D is normal.  Patient should take vitamin D 1000 units daily.

## 2019-06-18 DIAGNOSIS — L600 Ingrowing nail: Secondary | ICD-10-CM | POA: Diagnosis not present

## 2019-06-18 DIAGNOSIS — L720 Epidermal cyst: Secondary | ICD-10-CM | POA: Diagnosis not present

## 2019-06-30 ENCOUNTER — Other Ambulatory Visit: Payer: Self-pay

## 2019-06-30 DIAGNOSIS — G4733 Obstructive sleep apnea (adult) (pediatric): Secondary | ICD-10-CM | POA: Diagnosis not present

## 2019-07-01 ENCOUNTER — Ambulatory Visit (INDEPENDENT_AMBULATORY_CARE_PROVIDER_SITE_OTHER): Payer: Medicare HMO | Admitting: Adult Health

## 2019-07-01 ENCOUNTER — Encounter: Payer: Self-pay | Admitting: Adult Health

## 2019-07-01 VITALS — BP 148/90 | Temp 97.1°F | Ht 69.0 in | Wt 281.0 lb

## 2019-07-01 DIAGNOSIS — I10 Essential (primary) hypertension: Secondary | ICD-10-CM | POA: Diagnosis not present

## 2019-07-01 DIAGNOSIS — Z0000 Encounter for general adult medical examination without abnormal findings: Secondary | ICD-10-CM | POA: Diagnosis not present

## 2019-07-01 DIAGNOSIS — R69 Illness, unspecified: Secondary | ICD-10-CM | POA: Diagnosis not present

## 2019-07-01 DIAGNOSIS — M359 Systemic involvement of connective tissue, unspecified: Secondary | ICD-10-CM | POA: Diagnosis not present

## 2019-07-01 DIAGNOSIS — E669 Obesity, unspecified: Secondary | ICD-10-CM

## 2019-07-01 DIAGNOSIS — I890 Lymphedema, not elsewhere classified: Secondary | ICD-10-CM | POA: Diagnosis not present

## 2019-07-01 DIAGNOSIS — G4733 Obstructive sleep apnea (adult) (pediatric): Secondary | ICD-10-CM | POA: Diagnosis not present

## 2019-07-01 DIAGNOSIS — F325 Major depressive disorder, single episode, in full remission: Secondary | ICD-10-CM

## 2019-07-01 DIAGNOSIS — E66812 Obesity, class 2: Secondary | ICD-10-CM

## 2019-07-01 DIAGNOSIS — K219 Gastro-esophageal reflux disease without esophagitis: Secondary | ICD-10-CM | POA: Diagnosis not present

## 2019-07-01 LAB — LIPID PANEL
Cholesterol: 190 mg/dL (ref 0–200)
HDL: 66.2 mg/dL (ref >39.00)
LDL Cholesterol: 109 mg/dL — ABNORMAL HIGH (ref 0–99)
NonHDL: 124
Total CHOL/HDL Ratio: 3
Triglycerides: 75 mg/dL (ref 0.0–149.0)
VLDL: 15 mg/dL (ref 0.0–40.0)

## 2019-07-01 LAB — COMPREHENSIVE METABOLIC PANEL
ALT: 10 U/L (ref 0–35)
AST: 15 U/L (ref 0–37)
Albumin: 4.1 g/dL (ref 3.5–5.2)
Alkaline Phosphatase: 118 U/L — ABNORMAL HIGH (ref 39–117)
BUN: 15 mg/dL (ref 6–23)
CO2: 28 mEq/L (ref 19–32)
Calcium: 9.4 mg/dL (ref 8.4–10.5)
Chloride: 104 mEq/L (ref 96–112)
Creatinine, Ser: 0.75 mg/dL (ref 0.40–1.20)
GFR: 90.97 mL/min (ref >60.00)
Glucose, Bld: 90 mg/dL (ref 70–99)
Potassium: 4 mEq/L (ref 3.5–5.1)
Sodium: 140 mEq/L (ref 135–145)
Total Bilirubin: 0.8 mg/dL (ref 0.2–1.2)
Total Protein: 6.7 g/dL (ref 6.0–8.3)

## 2019-07-01 LAB — CBC WITH DIFFERENTIAL/PLATELET
Basophils Absolute: 0.1 10*3/uL (ref 0.0–0.1)
Basophils Relative: 1.1 % (ref 0.0–3.0)
Eosinophils Absolute: 0.2 10*3/uL (ref 0.0–0.7)
Eosinophils Relative: 4.6 % (ref 0.0–5.0)
HCT: 36.8 % (ref 36.0–46.0)
Hemoglobin: 12.3 g/dL (ref 12.0–15.0)
Lymphocytes Relative: 21.3 % (ref 12.0–46.0)
Lymphs Abs: 1.1 10*3/uL (ref 0.7–4.0)
MCHC: 33.3 g/dL (ref 30.0–36.0)
MCV: 88.1 fl (ref 78.0–100.0)
Monocytes Absolute: 0.4 10*3/uL (ref 0.1–1.0)
Monocytes Relative: 8.2 % (ref 3.0–12.0)
Neutro Abs: 3.4 10*3/uL (ref 1.4–7.7)
Neutrophils Relative %: 64.8 % (ref 43.0–77.0)
Platelets: 253 10*3/uL (ref 150.0–400.0)
RBC: 4.18 Mil/uL (ref 3.87–5.11)
RDW: 13.4 % (ref 11.5–15.5)
WBC: 5.3 10*3/uL (ref 4.0–10.5)

## 2019-07-01 LAB — TSH: TSH: 3.46 u[IU]/mL (ref 0.35–4.50)

## 2019-07-01 MED ORDER — LOSARTAN POTASSIUM 25 MG PO TABS: 25.0000 mg | ORAL_TABLET | Freq: Every day | ORAL | 3 refills | Status: DC

## 2019-07-01 MED ORDER — SERTRALINE HCL 100 MG PO TABS: 100.0000 mg | ORAL_TABLET | Freq: Every morning | ORAL | 3 refills | Status: DC

## 2019-07-01 MED ORDER — PANTOPRAZOLE SODIUM 40 MG PO TBEC: DELAYED_RELEASE_TABLET | ORAL | 3 refills | Status: DC

## 2019-07-01 MED ORDER — POTASSIUM CHLORIDE CRYS ER 20 MEQ PO TBCR: 40.0000 meq | EXTENDED_RELEASE_TABLET | Freq: Every morning | ORAL | 3 refills | Status: DC

## 2019-07-01 NOTE — Progress Notes (Signed)
Subjective:    Patient ID: Taylor Mccall, female    DOB: 1945-09-03, 74 y.o.   MRN: [id number]  HPI Patient presents for yearly preventative medicine examination. She is a pleasant 74 year old female who  has a past medical history of Anxiety, Blood transfusion without reported diagnosis, Breast mass, left, Cataract, Depression, Gait disturbance, GERD (gastroesophageal reflux disease), Hypertension, Lymphedema of both lower extremities, Obesity, Osteoarthritis, Rheumatoid arthritis(714.0), Sleep apnea, Urinary incontinence, and Venous insufficiency.  Essential hypertension-takes Lasix and Cozaar.  She denies dizziness, lightheadedness, chest pain, or shortness of breath. She did not take her blood pressure medication this morning.   BP Readings from Last 3 Encounters:  07/01/19 (!) 148/90  03/18/19 (!) 149/91  01/23/19 130/78   GERD-controlled with Protonix 40 mg.  She does take Plaquenil for autoimmune disease which causes her a lot of GI upset if she does not take her Protonix  Autoimmune disease -+ ANA, +RNP.  She is seen by rheumatology.  She has not had any signs or symptoms of an autoimmune disease flare recently.  She is doing well with Plaquenil.  She does have chronic pain in bilateral hands and bilateral knees that are more consistent with osteoarthritis.  Sleep dysfunction/Depression-takes Zoloft 100 mg nightly. Denies depressive symptoms   OSA - she uses her CPAP irregularly .  Lymphedema- has been seen at lymphedema clinic but is not going any more. She has the pumps at home but has not used them in the last three weeks due to an infection in her right great toe.   Need for bedside commode -due to chronic health issues such as lymphedema chronic osteoarthritis of bilateral knees despite bilateral knee replacements, and diuretic use for hypertension she has a hard time getting to the bathroom in the morning without urinary incontinence  All immunizations and health  maintenance protocols were reviewed with the patient and needed orders were placed. She is up to date on vaccinations   Appropriate screening laboratory values were ordered for the patient including screening of hyperlipidemia, renal function and hepatic function.   Medication reconciliation,  past medical history, social history, problem list and allergies were reviewed in detail with the patient  Goals were established with regard to weight loss, exercise, and  diet in compliance with medications   Review of Systems  Constitutional: Positive for fatigue.  HENT: Negative.   Respiratory: Negative.   Cardiovascular: Positive for leg swelling.  Gastrointestinal: Negative.   Musculoskeletal: Positive for arthralgias.  Allergic/Immunologic: Negative.   Neurological: Negative.   Psychiatric/Behavioral: Positive for sleep disturbance.  All other systems reviewed and are negative.  Past Medical History:  Diagnosis Date  . Anxiety   . Blood transfusion without reported diagnosis   . Breast mass, left   . Cataract   . Depression   . Gait disturbance   . GERD (gastroesophageal reflux disease)   . Hypertension   . Lymphedema of both lower extremities    Seeing OT at [facility] to Left Lower leg  . Obesity   . Osteoarthritis   . Rheumatoid arthritis(714.0)   . Sleep apnea    cpap  . Urinary incontinence   . Venous insufficiency     Social History   Socioeconomic History  . Marital status: Widowed    Spouse name: Not on file  . Number of children: Not on file  . Years of education: Not on file  . Highest education level: Not on file  Occupational History  .  Not on file  Tobacco Use  . Smoking status: Former Smoker    Packs/day: 0.30    Years: 10.00    Pack years: 3.00    Types: Cigarettes    Quit date: 02/05/1978    Years since quitting: 41.4  . Smokeless tobacco: Never Used  . Tobacco comment: over 25 years ago...pt doesnt remember when she  quit.   Substance and Sexual Activity  . Alcohol use: No  . Drug use: No  . Sexual activity: Not on file  Other Topics Concern  . Not on file  Social History Narrative  . Not on file   Social Determinants of Health   Financial Resource Strain:   . Difficulty of Paying Living Expenses:   Food Insecurity:   . Worried About Charity fundraiser in the Last Year:   . Arboriculturist in the Last Year:   Transportation Needs:   . Film/video editor (Medical):   Marland Kitchen Lack of Transportation (Non-Medical):   Physical Activity:   . Days of Exercise per Week:   . Minutes of Exercise per Session:   Stress:   . Feeling of Stress :   Social Connections:   . Frequency of Communication with Friends and Family:   . Frequency of Social Gatherings with Friends and Family:   . Attends Religious Services:   . Active Member of Clubs or Organizations:   . Attends Archivist Meetings:   Marland Kitchen Marital Status:   Intimate Partner Violence:   . Fear of Current or Ex-Partner:   . Emotionally Abused:   Marland Kitchen Physically Abused:   . Sexually Abused:     Past Surgical History:  Procedure Laterality Date  . ABDOMINAL HYSTERECTOMY    . BLADDER SURGERY     sling  . BLADDER SUSPENSION  2009  . BREAST REDUCTION SURGERY    . COLONOSCOPY    . ESOPHAGOGASTRODUODENOSCOPY    . EYE SURGERY    . REPLACEMENT TOTAL KNEE Left   . TOE SURGERY Left    Left great toe  . toenail removal Right    all 5 toenails  . TOTAL HIP ARTHROPLASTY    . TOTAL KNEE ARTHROPLASTY Right 12/09/2013   Procedure: Right Total Knee Arthroplasty;  Surgeon: Newt Minion, MD;  Location: [facility];  Service: Orthopedics;  Laterality: Right;  Marland Kitchen VESICOVAGINAL FISTULA CLOSURE W/ TAH      Family History  Problem Relation Age of Onset  . Diabetes Other   . Stroke Other   . Asthma Brother   . Heart disease Brother   . Coronary artery disease Father   . Skin cancer Father   . Heart disease Father   . Coronary artery disease Brother     . Colon cancer Neg Hx   . Esophageal cancer Neg Hx   . Rectal cancer Neg Hx   . Stomach cancer Neg Hx     Allergies  Allergen Reactions  . Penicillins Rash    Current Outpatient Medications on File Prior to Visit  Medication Sig Dispense Refill  . hydroxychloroquine (PLAQUENIL) 200 MG tablet TAKE 1 TABLET BY MOUTH TWICE A DAY 180 tablet 0  . aspirin 81 MG tablet Take 81 mg by mouth daily.    . carboxymethylcellulose (REFRESH TEARS) 0.5 % SOLN Place 2 drops into the right eye as needed (for itchy eyes).    Marland Kitchen erythromycin (ERY-TAB) 500 MG EC tablet Take 500-1,000 mg by mouth 2 (two) times daily as  needed (for dental procedures). Take 1000 mg by mouth 1 hour prior to dental appointment and take 500 mg by mouth 6 hours after dental work.    . furosemide (LASIX) 20 MG tablet Take 1 tablet (20 mg total) by mouth 2 (two) times daily. 1 by mouth twice a day 180 tablet 3  . furosemide (LASIX) 20 MG tablet Take 20 mg by mouth 2 (two) times daily.    . furosemide (LASIX) 20 MG tablet TAKE 1 TABLET (20 MG TOTAL) BY MOUTH 2 (TWO) TIMES DAILY. **DUE FOR YEARLY PHYSICAL** 180 tablet 0  . losartan (COZAAR) 25 MG tablet Take 25 mg by mouth daily.    . Multiple Vitamins-Minerals (PRESERVISION AREDS 2) CAPS Take 2 capsules by mouth daily.    . naproxen sodium (ANAPROX) 220 MG tablet Take 220 mg by mouth 2 (two) times daily as needed (for pain).    . NON FORMULARY 1 each by Other route See admin instructions. Use CPAP nightly.    . Omega-3 Fatty Acids (FISH OIL) 600 MG CAPS Take 1 capsule by mouth 2 (two) times daily.    . pantoprazole (PROTONIX) 40 MG tablet TAKE ONE TABLET BY MOUTH ONCE DAILY BEFORE BREAKFAST 90 tablet 3  . potassium chloride SA (K-DUR,KLOR-CON) 20 MEQ tablet Take 2 tablets (40 mEq total) by mouth every morning. 180 tablet 3  . sertraline (ZOLOFT) 100 MG tablet Take 1 tablet (100 mg total) by mouth every morning. 90 tablet 3  . vitamin B-12 (CYANOCOBALAMIN) 1000 MCG tablet Take 1,000 mcg  by mouth daily.    Marland Kitchen VITAMIN D PO Take by mouth daily.    . Vitamin D, Ergocalciferol, (DRISDOL) 1.25 MG (50000 UNIT) CAPS capsule Take 1 capsule (50,000 Units total) by mouth every 7 (seven) days. 12 capsule 0  . vitamin E 400 UNIT capsule Take 400 Units by mouth daily.     No current facility-administered medications on file prior to visit.    BP (!) 148/90   Temp (!) 97.1 F (36.2 C)   Ht 5\' 9"  (1.753 m)   Wt 281 lb (127.5 kg)   BMI 41.50 kg/m       Objective:   Physical Exam Vitals and nursing note reviewed.  Constitutional:      General: She is not in acute distress.    Appearance: Normal appearance. She is well-developed. She is obese. She is not ill-appearing.  HENT:     Head: Normocephalic and atraumatic.     Right Ear: Tympanic membrane, ear canal and external ear normal. There is no impacted cerumen.     Left Ear: Tympanic membrane, ear canal and external ear normal. There is no impacted cerumen.     Nose: Nose normal. No congestion or rhinorrhea.     Mouth/Throat:     Mouth: Mucous membranes are moist.     Pharynx: Oropharynx is clear. No oropharyngeal exudate or posterior oropharyngeal erythema.  Eyes:     General:        Right eye: No discharge.        Left eye: No discharge.     Extraocular Movements: Extraocular movements intact.     Conjunctiva/sclera: Conjunctivae normal.     Pupils: Pupils are equal, round, and reactive to light.  Neck:     Thyroid: No thyromegaly.     Vascular: No carotid bruit.     Trachea: No tracheal deviation.  Cardiovascular:     Rate and Rhythm: Normal rate and regular rhythm.  Pulses: Normal pulses.     Heart sounds: Normal heart sounds. No murmur. No friction rub. No gallop.   Pulmonary:     Effort: Pulmonary effort is normal. No respiratory distress.     Breath sounds: Normal breath sounds. No stridor. No wheezing, rhonchi or rales.  Chest:     Chest wall: No tenderness.  Abdominal:     General: Abdomen is flat.  Bowel sounds are normal. There is no distension.     Palpations: Abdomen is soft. There is no mass.     Tenderness: There is no abdominal tenderness. There is no right CVA tenderness, left CVA tenderness, guarding or rebound.     Hernia: No hernia is present.  Musculoskeletal:        General: No swelling, tenderness, deformity or signs of injury. Normal range of motion.     Cervical back: Normal range of motion and neck supple.     Right lower leg: Edema present.     Left lower leg: Edema present.     Comments: Lymphedema present bilaterally L>R  Lymphadenopathy:     Cervical: No cervical adenopathy.  Skin:    General: Skin is warm and dry.     Coloration: Skin is not jaundiced or pale.     Findings: No bruising, erythema, lesion or rash.     Comments: No infection noted in right great toe   Neurological:     General: No focal deficit present.     Mental Status: She is alert and oriented to person, place, and time.     Cranial Nerves: No cranial nerve deficit.     Sensory: No sensory deficit.     Motor: No weakness.     Coordination: Coordination normal.     Gait: Gait normal.     Deep Tendon Reflexes: Reflexes normal.  Psychiatric:        Mood and Affect: Mood normal.        Behavior: Behavior normal.        Thought Content: Thought content normal.        Judgment: Judgment normal.       Assessment & Plan:  1. Routine general medical examination at a health care facility  - DME Bedside commode - pantoprazole (PROTONIX) 40 MG tablet; TAKE ONE TABLET BY MOUTH ONCE DAILY BEFORE BREAKFAST  Dispense: 90 tablet; Refill: 3 - sertraline (ZOLOFT) 100 MG tablet; Take 1 tablet (100 mg total) by mouth every morning.  Dispense: 90 tablet; Refill: 3 - potassium chloride SA (KLOR-CON) 20 MEQ tablet; Take 2 tablets (40 mEq total) by mouth every morning.  Dispense: 180 tablet; Refill: 3 - losartan (COZAAR) 25 MG tablet; Take 1 tablet (25 mg total) by mouth daily.  Dispense: 90 tablet;  Refill: 3 - CBC with Differential/Platelet - Comprehensive metabolic panel - Lipid panel - TSH  2. Autoimmune disease ([place]) - Follow up with Rheumatology as directed  3. Essential hypertension - No change in medications  - potassium chloride SA (KLOR-CON) 20 MEQ tablet; Take 2 tablets (40 mEq total) by mouth every morning.  Dispense: 180 tablet; Refill: 3 - losartan (COZAAR) 25 MG tablet; Take 1 tablet (25 mg total) by mouth daily.  Dispense: 90 tablet; Refill: 3 - CBC with Differential/Platelet - Comprehensive metabolic panel - Lipid panel - TSH  4. Obstructive sleep apnea - Wear CPAP nightly.   5. Gastroesophageal reflux disease without esophagitis  - pantoprazole (PROTONIX) 40 MG tablet; TAKE ONE TABLET BY MOUTH ONCE DAILY BEFORE  BREAKFAST  Dispense: 90 tablet; Refill: 3  6. Lymphedema - Needs to start using her home pumps again  - DME Bedside commode  7. Class 2 obesity without serious comorbidity in adult, unspecified BMI, unspecified obesity type - Encouraged to work on diet as it is hard for her to exercise due to osteoarthritis  - DME Bedside commode - CBC with Differential/Platelet - Comprehensive metabolic panel - Lipid panel - TSH  8. Major depressive disorder in full remission, unspecified whether recurrent (HCC) - No change in medications  - sertraline (ZOLOFT) 100 MG tablet; Take 1 tablet (100 mg total) by mouth every morning.  Dispense: 90 tablet; Refill: 3   Dorothyann Peng, NP

## 2019-07-01 NOTE — Addendum Note (Signed)
Addended by: Miles Costain T on: 07/01/2019 12:26 PM   Modules accepted: Orders

## 2019-07-01 NOTE — Patient Instructions (Signed)
It was great seeing you today   Please start using your pumps again   We will follow up with you regarding your blood work   Please let me know if you do not hear anything about the bedside commode in the next week or so

## 2019-07-14 ENCOUNTER — Other Ambulatory Visit: Payer: Self-pay | Admitting: Adult Health

## 2019-07-14 ENCOUNTER — Telehealth: Payer: Self-pay | Admitting: Adult Health

## 2019-07-14 DIAGNOSIS — R269 Unspecified abnormalities of gait and mobility: Secondary | ICD-10-CM

## 2019-07-14 DIAGNOSIS — G4733 Obstructive sleep apnea (adult) (pediatric): Secondary | ICD-10-CM | POA: Diagnosis not present

## 2019-07-14 NOTE — Telephone Encounter (Signed)
I ordered it. The order was placed during her CPE

## 2019-07-14 NOTE — Telephone Encounter (Signed)
Taylor Mccall, I don't see where this was ordered.

## 2019-07-14 NOTE — Telephone Encounter (Signed)
The patient called in reference to a bedside commode. She was told to contact the office if she hasn't heard anything in a week or so.  Please advise

## 2019-07-15 DIAGNOSIS — I890 Lymphedema, not elsewhere classified: Secondary | ICD-10-CM | POA: Diagnosis not present

## 2019-07-15 NOTE — Telephone Encounter (Signed)
Spoke to the pt and she stated she would like the order sent to [facility].

## 2019-07-16 NOTE — Telephone Encounter (Signed)
Order sent to [facility].  [facility] is only nursing services now.  [facility] is DME.  Fax # [phone].  Nothing further needed.

## 2019-07-23 DIAGNOSIS — R2689 Other abnormalities of gait and mobility: Secondary | ICD-10-CM | POA: Diagnosis not present

## 2019-07-24 ENCOUNTER — Encounter: Payer: Self-pay | Admitting: Emergency Medicine

## 2019-07-24 ENCOUNTER — Ambulatory Visit
Admission: EM | Admit: 2019-07-24 | Discharge: 2019-07-24 | Disposition: A | Discharge status: Home / Self Care | Payer: 59

## 2019-07-24 ENCOUNTER — Other Ambulatory Visit: Payer: Self-pay

## 2019-07-24 DIAGNOSIS — R109 Unspecified abdominal pain: Secondary | ICD-10-CM | POA: Diagnosis not present

## 2019-07-24 LAB — POCT URINALYSIS DIP (MANUAL ENTRY)
Bilirubin, UA: NEGATIVE
Blood, UA: NEGATIVE
Glucose, UA: NEGATIVE mg/dL
Ketones, POC UA: NEGATIVE mg/dL
Leukocytes, UA: NEGATIVE
Nitrite, UA: NEGATIVE
Protein Ur, POC: NEGATIVE mg/dL
Spec Grav, UA: >=1.03 — AB (ref 1.010–1.025)
Urobilinogen, UA: 0.2 E.U./dL
pH, UA: 5.5 (ref 5.0–8.0)

## 2019-07-24 MED ORDER — KETOROLAC TROMETHAMINE 10 MG PO TABS: 10.0000 mg | ORAL_TABLET | Freq: Four times a day (QID) | ORAL | 0 refills | Status: DC | PRN

## 2019-07-24 MED ORDER — ONDANSETRON 4 MG PO TBDP
4.0000 mg | ORAL_TABLET | Freq: Once | ORAL | Status: AC
  Administered 2019-07-24: 4 mg via ORAL

## 2019-07-24 MED ORDER — KETOROLAC TROMETHAMINE 30 MG/ML IJ SOLN
30.0000 mg | Freq: Once | INTRAMUSCULAR | Status: AC
  Administered 2019-07-24: 30 mg via INTRAMUSCULAR

## 2019-07-24 MED ORDER — ONDANSETRON 4 MG PO TBDP
4.0000 mg | ORAL_TABLET | Freq: Three times a day (TID) | ORAL | 0 refills | Status: DC | PRN
Start: 2019-07-24 — End: 2019-11-26

## 2019-07-24 NOTE — ED Provider Notes (Signed)
[facility]    CSN: [id number] Arrival date & time: 07/24/19  1807      History   Chief Complaint Chief Complaint  Patient presents with  . Flank Pain    HPI MAFALDA MCGINNISS is a 74 y.o. female with extensive medical history as outlined below including lymphedema of lower extremities, hypertension, obesity, sleep apnea, RA, OA, blood transfusion presenting for right flank pain with nausea since this morning.  Denies vomiting, fever.  No history of gallstones or cholecystectomy.  No burning with urination, though does feel a sense of urgency.  Has not had BM today, not on typical for her.  Denies worsening swelling in lower extremities from baseline.  No chest pain or difficulty breathing.  States deep inspiration hurts more.  Denies history of renal calculi, pyelonephritis, diverticulitis.  Does admit to change in diet over the last few months: Has been eating more lean cuisine's.  Past Medical History:  Diagnosis Date  . Anxiety   . Blood transfusion without reported diagnosis   . Breast mass, left   . Cataract   . Depression   . Gait disturbance   . GERD (gastroesophageal reflux disease)   . Hypertension   . Lymphedema of both lower extremities    Seeing OT at [facility] to Left Lower leg  . Obesity   . Osteoarthritis   . Rheumatoid arthritis(714.0)   . Sleep apnea    cpap  . Urinary incontinence   . Venous insufficiency     Patient Active Problem List   Diagnosis Date Noted  . Routine general medical examination at a health care facility 12/11/2016  . Primary osteoarthritis of left hip 05/01/2016  . Autoimmune disease ([place]) 11/29/2015  . High risk medication use 11/29/2015  . Vitamin D deficiency 11/29/2015  . Macular degeneration 11/29/2015  . Bilateral lower extremity edema 10/26/2015  . H/O total knee replacement, bilateral 12/09/2013  . Breast mass, left 01/18/2011  . Obesity 12/08/2009  . Venous (peripheral)  insufficiency 07/28/2009  . PAIN IN JOINT, ANKLE AND FOOT 01/12/2008  . Obstructive sleep apnea 08/29/2007  . GAIT DISTURBANCE 08/14/2007  . Depression 08/26/2006  . Essential hypertension 08/26/2006  . Primary osteoarthritis of both hands 08/26/2006  . Urinary incontinence 08/26/2006    Past Surgical History:  Procedure Laterality Date  . ABDOMINAL HYSTERECTOMY    . BLADDER SURGERY     sling  . BLADDER SUSPENSION  2009  . BREAST REDUCTION SURGERY    . COLONOSCOPY    . ESOPHAGOGASTRODUODENOSCOPY    . EYE SURGERY    . REPLACEMENT TOTAL KNEE Left   . TOE SURGERY Left    Left great toe  . toenail removal Right    all 5 toenails  . TOTAL HIP ARTHROPLASTY    . TOTAL KNEE ARTHROPLASTY Right 12/09/2013   Procedure: Right Total Knee Arthroplasty;  Surgeon: Newt Minion, MD;  Location: [facility];  Service: Orthopedics;  Laterality: Right;  Marland Kitchen VESICOVAGINAL FISTULA CLOSURE W/ TAH      OB History   No obstetric history on file.      Home Medications    Prior to Admission medications   Medication Sig Start Date End Date Taking? Authorizing Provider  aspirin 81 MG tablet Take 81 mg by mouth daily.   Yes [provider]  BIOTIN PO [employer] [place] (Hair, Skin & Nails) Gummy   Yes [provider]  furosemide (LASIX) 20 MG tablet TAKE 1 TABLET (20  MG TOTAL) BY MOUTH 2 (TWO) TIMES DAILY. **DUE FOR YEARLY PHYSICAL** Patient taking differently: Take 20 mg by mouth 2 (two) times daily.  05/15/19  Yes Nafziger, Tommi Rumps, NP  hydroxychloroquine (PLAQUENIL) 200 MG tablet TAKE 1 TABLET BY MOUTH TWICE A DAY 04/27/19  Yes Deveshwar, Abel Presto, MD  losartan (COZAAR) 25 MG tablet Take 1 tablet (25 mg total) by mouth daily. 07/01/19  Yes Nafziger, Tommi Rumps, NP  Multiple Vitamin (MULTIVITAMIN) tablet Take 1 tablet by mouth daily.   Yes [provider]  pantoprazole (PROTONIX) 40 MG tablet TAKE ONE TABLET BY MOUTH ONCE DAILY BEFORE BREAKFAST 07/01/19  Yes Nafziger, Tommi Rumps, NP  potassium chloride  SA (KLOR-CON) 20 MEQ tablet Take 2 tablets (40 mEq total) by mouth every morning. 07/01/19 09/29/19 Yes Nafziger, Tommi Rumps, NP  sertraline (ZOLOFT) 100 MG tablet Take 100 mg by mouth daily. 07/01/19  Yes [provider]  Travoprost, BAK Free, (TRAVATAN) 0.004 % SOLN ophthalmic solution Place 1 drop into both eyes at bedtime.   Yes [provider]  ALEVE 220 MG CAPS Take 440 mg by mouth daily as needed.    [provider]  carboxymethylcellulose (REFRESH TEARS) 0.5 % SOLN Place 2 drops into the right eye as needed (for itchy eyes).    [provider]  ketorolac (TORADOL) 10 MG tablet Take 1 tablet (10 mg total) by mouth every 6 (six) hours as needed. 07/24/19   Hall-Potvin, [place], PA-C  Multiple Vitamins-Minerals (PRESERVISION AREDS 2) CAPS Take 2 capsules by mouth daily.    [provider]  NON FORMULARY 1 each by Other route See admin instructions. Use CPAP nightly.    [provider]  Omega-3 Fatty Acids (FISH OIL) 600 MG CAPS Take 1 capsule by mouth 2 (two) times daily.    [provider]  ondansetron (ZOFRAN ODT) 4 MG disintegrating tablet Take 1 tablet (4 mg total) by mouth every 8 (eight) hours as needed for nausea or vomiting. 07/24/19   Hall-Potvin, [place], PA-C  vitamin B-12 (CYANOCOBALAMIN) 1000 MCG tablet Take 1,000 mcg by mouth daily.    [provider]  vitamin E 400 UNIT capsule Take 400 Units by mouth daily.    [provider]    Family History Family History  Problem Relation Age of Onset  . Diabetes Other   . Stroke Other   . Asthma Brother   . Heart disease Brother   . Coronary artery disease Father   . Skin cancer Father   . Heart disease Father   . Coronary artery disease Brother   . Colon cancer Neg Hx   . Esophageal cancer Neg Hx   . Rectal cancer Neg Hx   . Stomach cancer Neg Hx     Social History Social History   Tobacco Use  . Smoking status: Former Smoker    Packs/day: 0.30     Years: 10.00    Pack years: 3.00    Types: Cigarettes    Quit date: 02/05/1978    Years since quitting: 41.4  . Smokeless tobacco: Never Used  . Tobacco comment: over 25 years ago...pt doesnt remember when she quit.   Vaping Use  . Vaping Use: Never used  Substance Use Topics  . Alcohol use: No  . Drug use: No     Allergies   Penicillins   Review of Systems As per HPI   Physical Exam Triage Vital Signs ED Triage Vitals  Enc Vitals Group     BP  Pulse      Resp      Temp      Temp src      SpO2      Weight      Height      Head Circumference      Peak Flow      Pain Score      Pain Loc      Pain Edu?      Excl. in [place]?    No data found.  Updated Vital Signs BP (!) 149/86 (BP Location: Left Arm)   Pulse 73   Temp 98 F (36.7 C) (Oral)   Resp 20   SpO2 96%   Visual Acuity Right Eye Distance:   Left Eye Distance:   Bilateral Distance:    Right Eye Near:   Left Eye Near:    Bilateral Near:     Physical Exam Constitutional:      General: She is not in acute distress. HENT:     Head: Normocephalic and atraumatic.  Eyes:     General: No scleral icterus.    Pupils: Pupils are equal, round, and reactive to light.  Cardiovascular:     Rate and Rhythm: Normal rate.  Pulmonary:     Effort: Pulmonary effort is normal.  Abdominal:     General: Bowel sounds are normal.     Palpations: Abdomen is soft.     Comments: Right side and CVA tenderness.  No rebound.  Negative Murphy's, McBurney's, Rovsing sign  Skin:    Coloration: Skin is not jaundiced or pale.  Neurological:     Mental Status: She is alert and oriented to person, place, and time.      UC Treatments / Results  Labs (all labs ordered are listed, but only abnormal results are displayed) Labs Reviewed  POCT URINALYSIS DIP (MANUAL ENTRY) - Abnormal; Notable for the following components:      Result Value   Spec Grav, UA >=1.030 (*)    All other components within normal limits     EKG   Radiology No results found.  Procedures Procedures (including critical care time)  Medications Ordered in UC Medications  ondansetron (ZOFRAN-ODT) disintegrating tablet 4 mg (4 mg Oral Given 07/24/19 1834)  ketorolac (TORADOL) 30 MG/ML injection 30 mg (30 mg Intramuscular Given 07/24/19 1900)    Initial Impression / Assessment and Plan / UC Course  I have reviewed the triage vital signs and the nursing notes.  Pertinent labs & imaging results that were available during my care of the patient were reviewed by me and considered in my medical decision making (see chart for details).     Patient afebrile, nontoxic in office today.  Urine dipstick significant for elevated specific gravity.H&P suspicious for renal colic: Patient given Zofran for nausea which she tolerated well.  Toradol for pain: Tolerated well.  Endorsing improvement in symptoms at time of discharge.  Patient still appears uncomfortable: Offered basic labs-patient declined.  Likely kidney stone versus acute cholecystitis.  Reviewed with patient and daughter out by car treatment plan, return precautions as outlined below.  Patient daughter verbalized understanding, are agreeable to plan. Final Clinical Impressions(s) / UC Diagnoses   Final diagnoses:  Right flank pain     Discharge Instructions     Important to drink plenty of water. May take Zofran and Toradol as needed for pain and nausea. Very important that you go to the ER by this time tomorrow if there is no improvement  in your symptoms. In the meantime, go to ER for fever, vomiting, worsening pain, dark or bloody urine, weakness.    ED Prescriptions    Medication Sig Dispense Auth. Provider   ketorolac (TORADOL) 10 MG tablet Take 1 tablet (10 mg total) by mouth every 6 (six) hours as needed. 20 tablet Hall-Potvin, [place], PA-C   ondansetron (ZOFRAN ODT) 4 MG disintegrating tablet Take 1 tablet (4 mg total) by mouth every 8 (eight) hours as  needed for nausea or vomiting. 21 tablet Hall-Potvin, [place], PA-C     PDMP not reviewed this encounter.   Neldon Mc Loraine, [place] 07/24/19 1933

## 2019-07-24 NOTE — ED Triage Notes (Signed)
Pt c/o acute right flank pain with nausea onset this morning. Denies burning with urination, frequency, urgency, hematuria, fever, chills.   +1 edema noted to BLE, but pt states edema is WNL for pt baseline 2/2 lymphedema.

## 2019-07-24 NOTE — Discharge Instructions (Addendum)
Important to drink plenty of water. May take Zofran and Toradol as needed for pain and nausea. Very important that you go to the ER by this time tomorrow if there is no improvement in your symptoms. In the meantime, go to ER for fever, vomiting, worsening pain, dark or bloody urine, weakness.

## 2019-07-25 ENCOUNTER — Emergency Department (HOSPITAL_COMMUNITY): Payer: Medicare HMO

## 2019-07-25 ENCOUNTER — Emergency Department (HOSPITAL_COMMUNITY)
Admission: EM | Admit: 2019-07-25 | Discharge: 2019-07-25 | Disposition: A | Discharge status: Home / Self Care | Payer: Medicare HMO | Attending: Emergency Medicine | Admitting: Emergency Medicine

## 2019-07-25 ENCOUNTER — Encounter (HOSPITAL_COMMUNITY): Payer: Self-pay

## 2019-07-25 DIAGNOSIS — Z7982 Long term (current) use of aspirin: Secondary | ICD-10-CM | POA: Insufficient documentation

## 2019-07-25 DIAGNOSIS — R1011 Right upper quadrant pain: Secondary | ICD-10-CM | POA: Insufficient documentation

## 2019-07-25 DIAGNOSIS — R109 Unspecified abdominal pain: Secondary | ICD-10-CM | POA: Diagnosis not present

## 2019-07-25 DIAGNOSIS — I10 Essential (primary) hypertension: Secondary | ICD-10-CM | POA: Insufficient documentation

## 2019-07-25 DIAGNOSIS — Z880 Allergy status to penicillin: Secondary | ICD-10-CM | POA: Diagnosis not present

## 2019-07-25 DIAGNOSIS — Z79899 Other long term (current) drug therapy: Secondary | ICD-10-CM | POA: Insufficient documentation

## 2019-07-25 DIAGNOSIS — Z87891 Personal history of nicotine dependence: Secondary | ICD-10-CM | POA: Insufficient documentation

## 2019-07-25 LAB — HEPATIC FUNCTION PANEL
ALT: 11 U/L (ref 0–44)
AST: 18 U/L (ref 15–41)
Albumin: 3.7 g/dL (ref 3.5–5.0)
Alkaline Phosphatase: 96 U/L (ref 38–126)
Bilirubin, Direct: 0.2 mg/dL (ref 0.0–0.2)
Indirect Bilirubin: 0.5 mg/dL (ref 0.3–0.9)
Total Bilirubin: 0.7 mg/dL (ref 0.3–1.2)
Total Protein: 6.9 g/dL (ref 6.5–8.1)

## 2019-07-25 LAB — BASIC METABOLIC PANEL
Anion gap: 9 (ref 5–15)
BUN: 16 mg/dL (ref 8–23)
CO2: 26 mmol/L (ref 22–32)
Calcium: 8.9 mg/dL (ref 8.9–10.3)
Chloride: 100 mmol/L (ref 98–111)
Creatinine, Ser: 0.82 mg/dL (ref 0.44–1.00)
GFR calc Af Amer: >60 mL/min (ref >60)
GFR calc non Af Amer: >60 mL/min (ref >60)
Glucose, Bld: 102 mg/dL — ABNORMAL HIGH (ref 70–99)
Potassium: 3.7 mmol/L (ref 3.5–5.1)
Sodium: 135 mmol/L (ref 135–145)

## 2019-07-25 LAB — CBC
HCT: 41 % (ref 36.0–46.0)
Hemoglobin: 13.1 g/dL (ref 12.0–15.0)
MCH: 29 pg (ref 26.0–34.0)
MCHC: 32 g/dL (ref 30.0–36.0)
MCV: 90.9 fL (ref 80.0–100.0)
Platelets: 277 10*3/uL (ref 150–400)
RBC: 4.51 MIL/uL (ref 3.87–5.11)
RDW: 12.4 % (ref 11.5–15.5)
WBC: 6.3 10*3/uL (ref 4.0–10.5)
nRBC: 0 % (ref 0.0–0.2)

## 2019-07-25 LAB — URINALYSIS, ROUTINE W REFLEX MICROSCOPIC
Bilirubin Urine: NEGATIVE
Glucose, UA: NEGATIVE mg/dL
Hgb urine dipstick: NEGATIVE
Ketones, ur: NEGATIVE mg/dL
Leukocytes,Ua: NEGATIVE
Nitrite: NEGATIVE
Protein, ur: NEGATIVE mg/dL
Specific Gravity, Urine: 1.025 (ref 1.005–1.030)
pH: 5 (ref 5.0–8.0)

## 2019-07-25 LAB — LIPASE, BLOOD: Lipase: 18 U/L (ref 11–51)

## 2019-07-25 MED ORDER — METHOCARBAMOL 500 MG PO TABS
500.0000 mg | ORAL_TABLET | Freq: Two times a day (BID) | ORAL | 0 refills | Status: AC
Start: 2019-07-25 — End: 2019-08-01

## 2019-07-25 MED ORDER — MORPHINE SULFATE (PF) 4 MG/ML IV SOLN
4.0000 mg | Freq: Once | INTRAVENOUS | Status: AC
  Administered 2019-07-25: 4 mg via INTRAVENOUS
  Filled 2019-07-25: qty 1

## 2019-07-25 MED ORDER — ONDANSETRON HCL 4 MG/2ML IJ SOLN
4.0000 mg | Freq: Once | INTRAMUSCULAR | Status: AC
  Administered 2019-07-25: 4 mg via INTRAVENOUS
  Filled 2019-07-25: qty 2

## 2019-07-25 NOTE — ED Notes (Signed)
Pt transported to CT ?

## 2019-07-25 NOTE — ED Triage Notes (Signed)
Pt reports R flank pain and nausea since this morning. Denies dysuria. Denies vomiting or diarrhea.   Also reports that her R toe was infected and she was recently on penicillin for that.

## 2019-07-25 NOTE — ED Provider Notes (Signed)
[facility] DEPT Provider Note   CSN: [id number] Arrival date & time: 07/25/19  0043     History Chief Complaint  Patient presents with  . Flank Pain    R    Taylor Mccall is a 74 y.o. female with a history of rheumatoid arthritis on hydroxychloroquine, obesity, osteoarthritis, and OSA on CPAP who presents to the emergency department with a chief complaint of right flank pain.  The patient reports sudden onset, constant right flank pain that was present 24 hours ago when she awoke.  She characterizes the pain as sharp, "nagging" and nonradiating.  She is unsure if eating has made her symptoms worse.  No other known aggravating or alleviating factors.  She reports associated nausea.  No fever, chills, back pain, bloating, diarrhea, constipation, dysuria, hematuria, urinary frequency or hesitancy, vaginal bleeding or discharge, cough, shortness of breath, or chest pain.  She was seen at urgent care and was given Toradol and Zofran for her symptoms.  UA with elevated specific gravity, but otherwise unremarkable.  She was discharged with oral Toradol and Zofran, but has not picked up the prescriptions.  Surgical history includes hysterectomy and vesicovaginal fistula closure.  No history of kidney stones.  The history is provided by the patient. No language interpreter was used.       Past Medical History:  Diagnosis Date  . Anxiety   . Blood transfusion without reported diagnosis   . Breast mass, left   . Cataract   . Depression   . Gait disturbance   . GERD (gastroesophageal reflux disease)   . Hypertension   . Lymphedema of both lower extremities    Seeing OT at [facility] to Left Lower leg  . Obesity   . Osteoarthritis   . Rheumatoid arthritis(714.0)   . Sleep apnea    cpap  . Urinary incontinence   . Venous insufficiency     Patient Active Problem List   Diagnosis Date Noted  . Routine general medical  examination at a health care facility 12/11/2016  . Primary osteoarthritis of left hip 05/01/2016  . Autoimmune disease ([place]) 11/29/2015  . High risk medication use 11/29/2015  . Vitamin D deficiency 11/29/2015  . Macular degeneration 11/29/2015  . Bilateral lower extremity edema 10/26/2015  . H/O total knee replacement, bilateral 12/09/2013  . Breast mass, left 01/18/2011  . Obesity 12/08/2009  . Venous (peripheral) insufficiency 07/28/2009  . PAIN IN JOINT, ANKLE AND FOOT 01/12/2008  . Obstructive sleep apnea 08/29/2007  . GAIT DISTURBANCE 08/14/2007  . Depression 08/26/2006  . Essential hypertension 08/26/2006  . Primary osteoarthritis of both hands 08/26/2006  . Urinary incontinence 08/26/2006    Past Surgical History:  Procedure Laterality Date  . ABDOMINAL HYSTERECTOMY    . BLADDER SURGERY     sling  . BLADDER SUSPENSION  2009  . BREAST REDUCTION SURGERY    . COLONOSCOPY    . ESOPHAGOGASTRODUODENOSCOPY    . EYE SURGERY    . REPLACEMENT TOTAL KNEE Left   . TOE SURGERY Left    Left great toe  . toenail removal Right    all 5 toenails  . TOTAL HIP ARTHROPLASTY    . TOTAL KNEE ARTHROPLASTY Right 12/09/2013   Procedure: Right Total Knee Arthroplasty;  Surgeon: Newt Minion, MD;  Location: [facility];  Service: Orthopedics;  Laterality: Right;  [facility] VESICOVAGINAL FISTULA CLOSURE W/ TAH       OB History   No obstetric  history on file.     Family History  Problem Relation Age of Onset  . Diabetes Other   . Stroke Other   . Asthma Brother   . Heart disease Brother   . Coronary artery disease Father   . Skin cancer Father   . Heart disease Father   . Coronary artery disease Brother   . Colon cancer Neg Hx   . Esophageal cancer Neg Hx   . Rectal cancer Neg Hx   . Stomach cancer Neg Hx     Social History   Tobacco Use  . Smoking status: Former Smoker    Packs/day: 0.30    Years: 10.00    Pack years: 3.00    Types: Cigarettes    Quit date: 02/05/1978    Years  since quitting: 41.4  . Smokeless tobacco: Never Used  . Tobacco comment: over 25 years ago...pt doesnt remember when she quit.   Vaping Use  . Vaping Use: Never used  Substance Use Topics  . Alcohol use: No  . Drug use: No    Home Medications Prior to Admission medications   Medication Sig Start Date End Date Taking? Authorizing Provider  ALEVE 220 MG CAPS Take 440 mg by mouth daily as needed.   Yes [provider]  aspirin 81 MG tablet Take 81 mg by mouth daily.   Yes [provider]  BIOTIN PO [employer] [place] (Hair, Skin & Nails) Gummy   Yes [provider]  carboxymethylcellulose (REFRESH TEARS) 0.5 % SOLN Place 2 drops into the right eye as needed (for itchy eyes).   Yes [provider]  furosemide (LASIX) 20 MG tablet TAKE 1 TABLET (20 MG TOTAL) BY MOUTH 2 (TWO) TIMES DAILY. **DUE FOR YEARLY PHYSICAL** Patient taking differently: Take 20 mg by mouth 2 (two) times daily.  05/15/19  Yes Nafziger, Tommi Rumps, NP  ketorolac (TORADOL) 10 MG tablet Take 1 tablet (10 mg total) by mouth every 6 (six) hours as needed. 07/24/19  Yes Hall-Potvin, [place], PA-C  losartan (COZAAR) 25 MG tablet Take 1 tablet (25 mg total) by mouth daily. 07/01/19  Yes Nafziger, Tommi Rumps, NP  Multiple Vitamin (MULTIVITAMIN) tablet Take 1 tablet by mouth daily.   Yes [provider]  Multiple Vitamins-Minerals (PRESERVISION AREDS 2) CAPS Take 2 capsules by mouth daily.   Yes [provider]  NON FORMULARY 1 each by Other route See admin instructions. Use CPAP nightly.   Yes [provider]  Omega-3 Fatty Acids (FISH OIL) 600 MG CAPS Take 1 capsule by mouth 2 (two) times daily.   Yes [provider]  ondansetron (ZOFRAN ODT) 4 MG disintegrating tablet Take 1 tablet (4 mg total) by mouth every 8 (eight) hours as needed for nausea or vomiting. 07/24/19  Yes Hall-Potvin, [place], PA-C  pantoprazole (PROTONIX) 40 MG tablet TAKE ONE TABLET BY MOUTH ONCE DAILY  BEFORE BREAKFAST 07/01/19  Yes Nafziger, Tommi Rumps, NP  potassium chloride SA (KLOR-CON) 20 MEQ tablet Take 2 tablets (40 mEq total) by mouth every morning. 07/01/19 09/29/19 Yes Nafziger, Tommi Rumps, NP  sertraline (ZOLOFT) 100 MG tablet Take 100 mg by mouth daily. 07/01/19  Yes [provider]  Travoprost, BAK Free, (TRAVATAN) 0.004 % SOLN ophthalmic solution Place 1 drop into both eyes at bedtime.   Yes [provider]  vitamin B-12 (CYANOCOBALAMIN) 1000 MCG tablet Take 1,000 mcg by mouth daily.   Yes [provider]  vitamin E 400 UNIT capsule Take 400 Units by mouth daily.  Yes [provider]  hydroxychloroquine (PLAQUENIL) 200 MG tablet TAKE 1 TABLET BY MOUTH TWICE A DAY 04/27/19   Bo Merino, MD    Allergies    Penicillins  Review of Systems   Review of Systems  Constitutional: Negative for activity change, chills, fatigue and fever.  HENT: Negative for congestion and sore throat.   Eyes: Negative for visual disturbance.  Respiratory: Negative for shortness of breath.   Cardiovascular: Negative for chest pain.  Gastrointestinal: Positive for nausea. Negative for abdominal pain, blood in stool, diarrhea and vomiting.  Genitourinary: Positive for flank pain. Negative for dysuria, hematuria, urgency, vaginal discharge and vaginal pain.  Musculoskeletal: Negative for back pain, myalgias, neck pain and neck stiffness.  Skin: Negative for rash.  Allergic/Immunologic: Negative for immunocompromised state.  Neurological: Negative for dizziness, seizures, syncope, weakness, numbness and headaches.  Psychiatric/Behavioral: Negative for confusion.    Physical Exam Updated Vital Signs BP 138/74 (BP Location: Left Arm)   Pulse 68   Temp 98.3 F (36.8 C)   Resp 17   SpO2 99%   Physical Exam Vitals and nursing note reviewed.  Constitutional:      General: She is not in acute distress.    Appearance: She is obese. She is not ill-appearing, toxic-appearing  or diaphoretic.  HENT:     Head: Normocephalic.  Eyes:     Conjunctiva/sclera: Conjunctivae normal.  Cardiovascular:     Rate and Rhythm: Normal rate and regular rhythm.     Heart sounds: No murmur heard.  No friction rub. No gallop.   Pulmonary:     Effort: Pulmonary effort is normal. No respiratory distress.     Breath sounds: No stridor. No wheezing, rhonchi or rales.     Comments: No tenderness to palpation to the right ribs.  No crepitus or step-offs.  No rashes.  Chest wall is nontender. Chest:     Chest wall: No tenderness.  Abdominal:     General: There is no distension.     Palpations: Abdomen is soft. There is no mass.     Tenderness: There is abdominal tenderness. There is right CVA tenderness. There is no left CVA tenderness, guarding or rebound.     Hernia: No hernia is present.     Comments: There is right CVA tenderness.  She also has right upper quadrant tenderness and a questionable Murphy sign.  Exam is somewhat limited secondary to body habitus.  Abdomen is obese, but soft and nondistended.  Normoactive bowel sounds.  No tenderness over McBurney's point.  Musculoskeletal:     Cervical back: Neck supple.  Skin:    General: Skin is warm.     Findings: No rash.  Neurological:     Mental Status: She is alert.  Psychiatric:        Behavior: Behavior normal.     ED Results / Procedures / Treatments   Labs (all labs ordered are listed, but only abnormal results are displayed) Labs Reviewed  BASIC METABOLIC PANEL - Abnormal; Notable for the following components:      Result Value   Glucose, Bld 102 (*)    All other components within normal limits  URINE CULTURE  URINALYSIS, ROUTINE W REFLEX MICROSCOPIC  CBC  HEPATIC FUNCTION PANEL  LIPASE, BLOOD    EKG None  Radiology No results found.  Procedures Procedures (including critical care time)  Medications Ordered in ED Medications  morphine 4 MG/ML injection 4 mg (4 mg Intravenous Given 07/25/19 0634)  ondansetron [facility]) injection 4 mg (4 mg Intravenous Given 07/25/19 9480)    ED Course  I have reviewed the triage vital signs and the nursing notes.  Pertinent labs & imaging results that were available during my care of the patient were reviewed by me and considered in my medical decision making (see chart for details).    MDM Rules/Calculators/A&P                          74 year old female with a history of rheumatoid arthritis on hydroxychloroquine, obesity, osteoarthritis, and OSA on CPAP presenting with right flank pain and nausea onset over the last 24 hours.  She was seen at urgent care for the same and had a concentrated urine sample, but it did not appear infectious.  UA in the emergency department is unremarkable.  No leukocytosis.  No electrolyte derangements.  However, given her exam there is right upper quadrant tenderness and a questionable Murphy sign.  Exam is limited secondary to body habitus. I am more concerned for cholecystitis given her right upper quadrant exam.  Will add on hepatic function panel and lipase as well as right upper quadrant ultrasound.  If these studies and labs are unremarkable, she warrants a CT stone study for occult kidney stone.  Morphine and Zofran given for symptomatic control.  The patient was discussed with Dr. Sedonia Small, attending physician, who is in agreement with the work-up and plan.  Patient care transferred to [place] at the end of my shift to follow-up on right upper quadrant ultrasounds, hepatic function panel, and lipase. Patient presentation, ED course, and plan of care discussed with review of all pertinent labs and imaging. Please see his/her note for further details regarding further ED course and disposition.   Final Clinical Impression(s) / ED Diagnoses Final diagnoses:  RUQ pain    Rx / DC Orders ED Discharge Orders    None       Joanne Gavel, PA-C 07/25/19 0740    Maudie Flakes, MD 07/27/19 [phone]

## 2019-07-25 NOTE — ED Provider Notes (Signed)
  Physical Exam  BP (!) 144/93   Pulse 68   Temp 98.3 F (36.8 C)   Resp 19   SpO2 96%   Physical Exam  ED Course/Procedures     Procedures  MDM  Patient care assumed from [place]. PA-C at shift change, please see her note for a full HPI. Briefly, patient here with chief complaint of right flank pain for 24 hours.  Has had some nausea.  Seen in urgent care and given Toradol and Zofran for symptoms.  UA looks unremarkable, was for follow-up on hepatic function, lipase, right upper quadrant ultrasound along with CT renal.  10:53 AM function is within normal limits, LFTs are unremarkable.RUQ [place] is negative for any pathology. No dilation of common bile duct. Lipase level is normal. CT Renal showed:  1. No nephrolithiasis, ureterolithiasis, or obstructive uropathy.  2. Normal appendix.  3. Sigmoid diverticulosis without evidence diverticulitis.       Results of all imaging have been discussed with patient at length.  She was provided with a copy of her CT.  She is aware her labs have looked benign.  She has not had any more nausea however reports pain is consistent, does hurt somewhat worse when lifting her right hip, does state a prior fixation to her right hip 65 years ago.  Discussed some suspicion for MSK involvement, we will attempt to treat pain with muscle relaxers while at home.  She does not have any fever, vomiting, vitals are within normal limits.  She is agreeable of treatment at this time.  Lengthy discussion with daughter at the bedside, PCP follow-up has been recommended.  She reports improvement in symptoms, without any vomiting episodes while in the ED. Patient stable for discharge.   Portions of this note were generated with [employer]. Dictation errors may occur despite best attempts at proofreading.          Janeece Fitting, PA-C 07/25/19 1118    Maudie Flakes, MD 07/27/19 [phone]

## 2019-07-25 NOTE — Discharge Instructions (Signed)
Your laboratory results were within normal limits today.   We discussed the results of all your imaging reports.Will trial muscle relaxers to help with pain, please be aware this medication will likely make you drowsy.

## 2019-07-26 LAB — URINE CULTURE: Culture: <10000 — AB

## 2019-07-27 ENCOUNTER — Other Ambulatory Visit: Payer: Self-pay

## 2019-07-28 ENCOUNTER — Ambulatory Visit (INDEPENDENT_AMBULATORY_CARE_PROVIDER_SITE_OTHER)
Admission: RE | Admit: 2019-07-28 | Discharge: 2019-07-28 | Disposition: A | Discharge status: Home / Self Care | Payer: Medicare HMO | Source: Ambulatory Visit | Attending: Family Medicine | Admitting: Family Medicine

## 2019-07-28 ENCOUNTER — Encounter: Payer: Self-pay | Admitting: Family Medicine

## 2019-07-28 ENCOUNTER — Ambulatory Visit (INDEPENDENT_AMBULATORY_CARE_PROVIDER_SITE_OTHER): Payer: Medicare HMO | Admitting: Family Medicine

## 2019-07-28 VITALS — BP 132/70 | HR 95

## 2019-07-28 DIAGNOSIS — M545 Low back pain, unspecified: Secondary | ICD-10-CM

## 2019-07-28 MED ORDER — METHYLPREDNISOLONE 4 MG PO TBPK
ORAL_TABLET | ORAL | 0 refills | Status: DC
Start: 2019-07-28 — End: 2019-11-26

## 2019-07-28 NOTE — Progress Notes (Signed)
   Subjective:    Patient ID: Taylor Mccall, female    DOB: 06/15/1945, 74 y.o.   MRN: [id number]  HPI Here to follow up an urgent care visit on 07-24-19 and then the ER on 07-25-19 for right flank pain. No recent trauma. She has rheumatoid arthritis and she sees Dr. Estanislado Pandy for this. On 07-24-19 she suddenly developed a sharp pain in the right lower back that radiated around the right flank. No anterior abdominal pain. No fever or nausea. No change in bowel or urinary habits. At the urgent care she was given Toradol and Robaxin, but these have not helped. At the ER she had normal labs and a urine culture showed no growth. An abdominal US was normal including the gall bladder. A CT scan was unremarkable including th appendix. No explanation for the pain was found and she was advised to follow up with [place]. She says the pain is still present although it has improved slightly. Taking a deep breath makes the pain worse, though there is no SOB.    Review of Systems  Constitutional: Negative.   Respiratory: Negative.   Cardiovascular: Negative.   Gastrointestinal: Positive for abdominal pain.  Musculoskeletal: Positive for back pain.       Objective:   Physical Exam Constitutional:      General: She is not in acute distress.    Appearance: She is obese.     Comments: Walks with a cane   Cardiovascular:     Rate and Rhythm: Normal rate and regular rhythm.     Pulses: Normal pulses.     Heart sounds: Normal heart sounds.  Pulmonary:     Effort: Pulmonary effort is normal.     Breath sounds: Normal breath sounds.  Abdominal:     General: Abdomen is flat. Bowel sounds are normal. There is no distension.     Palpations: Abdomen is soft. There is no mass.     Tenderness: There is no abdominal tenderness. There is no guarding or rebound.     Hernia: No hernia is present.  Musculoskeletal:     Comments: She is tender in the right lower back just to the right of the spine, full ROM     Neurological:     Mental Status: She is alert.           Assessment & Plan:  Her right lower back and right flank pain seem to be originating in the lumbar spine, possibly pinching a nerve.  She will try a Medrol dose pack to relieve the inflammation. Set up Xrays of the lumbar spine today or tomorrow. Recheck as needed.  Alysia Penna, MD

## 2019-07-31 DIAGNOSIS — G4733 Obstructive sleep apnea (adult) (pediatric): Secondary | ICD-10-CM | POA: Diagnosis not present

## 2019-08-12 ENCOUNTER — Telehealth: Payer: Self-pay | Admitting: Adult Health

## 2019-08-12 NOTE — Telephone Encounter (Signed)
Patient is wanting a call back about about her imaging results.

## 2019-08-12 NOTE — Telephone Encounter (Signed)
Spoke to the pt and informed her of the x-ray ordered by Dr. Sarajane Jews.  She is unable to follow up at this time.  She is keeping her granddaughter for the summer.  She will call back to schedule an appointment when she is able.  Nothing further needed at this time.

## 2019-08-13 DIAGNOSIS — G4733 Obstructive sleep apnea (adult) (pediatric): Secondary | ICD-10-CM | POA: Diagnosis not present

## 2019-08-14 DIAGNOSIS — I890 Lymphedema, not elsewhere classified: Secondary | ICD-10-CM | POA: Diagnosis not present

## 2019-08-30 DIAGNOSIS — G4733 Obstructive sleep apnea (adult) (pediatric): Secondary | ICD-10-CM | POA: Diagnosis not present

## 2019-09-01 ENCOUNTER — Other Ambulatory Visit: Payer: Self-pay | Admitting: Rheumatology

## 2019-09-01 DIAGNOSIS — M359 Systemic involvement of connective tissue, unspecified: Secondary | ICD-10-CM

## 2019-09-01 MED ORDER — HYDROXYCHLOROQUINE SULFATE 200 MG PO TABS: 200.0000 mg | ORAL_TABLET | Freq: Two times a day (BID) | ORAL | 0 refills | Status: DC

## 2019-09-01 NOTE — Telephone Encounter (Signed)
Patient called requesting prescription refill of Hydroxycholoroquine to be sent to [facility] at [address]

## 2019-09-01 NOTE — Telephone Encounter (Signed)
Last Visit: 03/18/2019 Next Visit: 09/15/2019 Labs: 07/01/2019 Alk phos 118 Eye exam:  02/10/2019 WNL   Current Dose per office note 03/18/2019: Plaquenil 200 mg 1 tablet by mouth twice daily.   DX: Autoimmune disease   Okay to refill Plaquenil?

## 2019-09-01 NOTE — Progress Notes (Deleted)
Office Visit Note  Patient: Taylor Mccall             Date of Birth: Jun 18, 1945           MRN: [id number]             PCP: Dorothyann Peng, NP Referring: Dorothyann Peng, NP Visit Date: 09/15/2019 Occupation: @GUAROCC @  Subjective:  No chief complaint on file.   History of Present Illness: Taylor Mccall is a 74 y.o. female ***   Activities of Daily Living:  Patient reports morning stiffness for *** {minute/hour:19697}.   Patient {ACTIONS;DENIES/REPORTS:21021675::"Denies"} nocturnal pain.  Difficulty dressing/grooming: {ACTIONS;DENIES/REPORTS:21021675::"Denies"} Difficulty climbing stairs: {ACTIONS;DENIES/REPORTS:21021675::"Denies"} Difficulty getting out of chair: {ACTIONS;DENIES/REPORTS:21021675::"Denies"} Difficulty using hands for taps, buttons, cutlery, and/or writing: {ACTIONS;DENIES/REPORTS:21021675::"Denies"}  No Rheumatology ROS completed.   PMFS History:  Patient Active Problem List   Diagnosis Date Noted  . Routine general medical examination at a health care facility 12/11/2016  . Primary osteoarthritis of left hip 05/01/2016  . Autoimmune disease ([place]) 11/29/2015  . High risk medication use 11/29/2015  . Vitamin D deficiency 11/29/2015  . Macular degeneration 11/29/2015  . Bilateral lower extremity edema 10/26/2015  . H/O total knee replacement, bilateral 12/09/2013  . Breast mass, left 01/18/2011  . Obesity 12/08/2009  . Venous (peripheral) insufficiency 07/28/2009  . PAIN IN JOINT, ANKLE AND FOOT 01/12/2008  . Obstructive sleep apnea 08/29/2007  . GAIT DISTURBANCE 08/14/2007  . Depression 08/26/2006  . Essential hypertension 08/26/2006  . Primary osteoarthritis of both hands 08/26/2006  . Urinary incontinence 08/26/2006    Past Medical History:  Diagnosis Date  . Anxiety   . Blood transfusion without reported diagnosis   . Breast mass, left   . Cataract   . Depression   . Gait disturbance   . GERD (gastroesophageal reflux disease)   .  Hypertension   . Lymphedema of both lower extremities    Seeing OT at [facility] to Left Lower leg  . Obesity   . Osteoarthritis   . Rheumatoid arthritis(714.0)   . Sleep apnea    cpap  . Urinary incontinence   . Venous insufficiency     Family History  Problem Relation Age of Onset  . Diabetes Other   . Stroke Other   . Asthma Brother   . Heart disease Brother   . Coronary artery disease Father   . Skin cancer Father   . Heart disease Father   . Coronary artery disease Brother   . Colon cancer Neg Hx   . Esophageal cancer Neg Hx   . Rectal cancer Neg Hx   . Stomach cancer Neg Hx    Past Surgical History:  Procedure Laterality Date  . ABDOMINAL HYSTERECTOMY    . BLADDER SURGERY     sling  . BLADDER SUSPENSION  2009  . BREAST REDUCTION SURGERY    . COLONOSCOPY    . ESOPHAGOGASTRODUODENOSCOPY    . EYE SURGERY    . REPLACEMENT TOTAL KNEE Left   . TOE SURGERY Left    Left great toe  . toenail removal Right    all 5 toenails  . TOTAL HIP ARTHROPLASTY    . TOTAL KNEE ARTHROPLASTY Right 12/09/2013   Procedure: Right Total Knee Arthroplasty;  Surgeon: Newt Minion, MD;  Location: [facility];  Service: Orthopedics;  Laterality: Right;  [facility] VESICOVAGINAL FISTULA CLOSURE W/ TAH     Social History   Social History Narrative  . Not on file  Immunization History  Administered Date(s) Administered  . Influenza Split 12/14/2010, 12/06/2012  . Influenza Whole 02/06/2004, 12/08/2009  . Influenza, High Dose Seasonal PF 10/26/2015, 12/11/2016, 01/06/2018, 10/07/2018  . Influenza,inj,Quad PF,6+ Mos 10/15/2013, 11/19/2014  . Influenza-Unspecified 10/07/2018  . PFIZER SARS-COV-2 Vaccination 03/01/2019, 03/21/2019  . Pneumococcal Conjugate-13 03/18/2013  . Pneumococcal Polysaccharide-23 06/30/2008, 10/26/2015  . Td 02/05/2005  . Tdap 12/14/2010     Objective: Vital Signs: There were no vitals taken for this visit.   Physical Exam   Musculoskeletal  Exam: ***  CDAI Exam: CDAI Score: -- Patient Global: --; Provider Global: -- Swollen: --; Tender: -- Joint Exam 09/15/2019   No joint exam has been documented for this visit   There is currently no information documented on the homunculus. Go to the Rheumatology activity and complete the homunculus joint exam.  Investigation: No additional findings.  Imaging: No results found.  Recent Labs: Lab Results  Component Value Date   WBC 6.3 07/25/2019   HGB 13.1 07/25/2019   PLT 277 07/25/2019   NA 135 07/25/2019   K 3.7 07/25/2019   CL 100 07/25/2019   CO2 26 07/25/2019   GLUCOSE 102 (H) 07/25/2019   BUN 16 07/25/2019   CREATININE 0.82 07/25/2019   BILITOT 0.7 07/25/2019   ALKPHOS 96 07/25/2019   AST 18 07/25/2019   ALT 11 07/25/2019   PROT 6.9 07/25/2019   ALBUMIN 3.7 07/25/2019   CALCIUM 8.9 07/25/2019   GFRAA >60 07/25/2019    Speciality Comments: PLQ eye exam: 02/10/2019 WNL @ [facility]. Follow up in 6 months.  Procedures:  No procedures performed Allergies: Penicillins   Assessment / Plan:     Visit Diagnoses: No diagnosis found.  Orders: No orders of the defined types were placed in this encounter.  No orders of the defined types were placed in this encounter.   Face-to-face time spent with patient was *** minutes. Greater than 50% of time was spent in counseling and coordination of care.  Follow-Up Instructions: No follow-ups on file.   Earnestine Mealing, CMA  Note - This record has been created using Editor, commissioning.  Chart creation errors have been sought, but may not always  have been located. Such creation errors do not reflect on  the standard of medical care.

## 2019-09-13 DIAGNOSIS — G4733 Obstructive sleep apnea (adult) (pediatric): Secondary | ICD-10-CM | POA: Diagnosis not present

## 2019-09-14 NOTE — Progress Notes (Deleted)
Office Visit Note  Patient: Taylor Mccall             Date of Birth: [birth year]           MRN: [id number]             PCP: Dorothyann Peng, NP Referring: Dorothyann Peng, NP Visit Date: 09/28/2019 Occupation: @GUAROCC @  Subjective:  No chief complaint on file.   History of Present Illness: Taylor Mccall is a 74 y.o. female ***   Activities of Daily Living:  Patient reports morning stiffness for *** {minute/hour:19697}.   Patient {ACTIONS;DENIES/REPORTS:21021675::"Denies"} nocturnal pain.  Difficulty dressing/grooming: {ACTIONS;DENIES/REPORTS:21021675::"Denies"} Difficulty climbing stairs: {ACTIONS;DENIES/REPORTS:21021675::"Denies"} Difficulty getting out of chair: {ACTIONS;DENIES/REPORTS:21021675::"Denies"} Difficulty using hands for taps, buttons, cutlery, and/or writing: {ACTIONS;DENIES/REPORTS:21021675::"Denies"}  No Rheumatology ROS completed.   PMFS History:  Patient Active Problem List   Diagnosis Date Noted  . Routine general medical examination at a health care facility 12/11/2016  . Primary osteoarthritis of left hip 05/01/2016  . Autoimmune disease ([place]) 11/29/2015  . High risk medication use 11/29/2015  . Vitamin D deficiency 11/29/2015  . Macular degeneration 11/29/2015  . Bilateral lower extremity edema 10/26/2015  . H/O total knee replacement, bilateral 12/09/2013  . Breast mass, left 01/18/2011  . Obesity 12/08/2009  . Venous (peripheral) insufficiency 07/28/2009  . PAIN IN JOINT, ANKLE AND FOOT 01/12/2008  . Obstructive sleep apnea 08/29/2007  . GAIT DISTURBANCE 08/14/2007  . Depression 08/26/2006  . Essential hypertension 08/26/2006  . Primary osteoarthritis of both hands 08/26/2006  . Urinary incontinence 08/26/2006    Past Medical History:  Diagnosis Date  . Anxiety   . Blood transfusion without reported diagnosis   . Breast mass, left   . Cataract   . Depression   . Gait disturbance   . GERD (gastroesophageal reflux disease)   .  Hypertension   . Lymphedema of both lower extremities    Seeing OT at [facility] to Left Lower leg  . Obesity   . Osteoarthritis   . Rheumatoid arthritis(714.0)   . Sleep apnea    cpap  . Urinary incontinence   . Venous insufficiency     Family History  Problem Relation Age of Onset  . Diabetes Other   . Stroke Other   . Asthma Brother   . Heart disease Brother   . Coronary artery disease Father   . Skin cancer Father   . Heart disease Father   . Coronary artery disease Brother   . Colon cancer Neg Hx   . Esophageal cancer Neg Hx   . Rectal cancer Neg Hx   . Stomach cancer Neg Hx    Past Surgical History:  Procedure Laterality Date  . ABDOMINAL HYSTERECTOMY    . BLADDER SURGERY     sling  . BLADDER SUSPENSION  2009  . BREAST REDUCTION SURGERY    . COLONOSCOPY    . ESOPHAGOGASTRODUODENOSCOPY    . EYE SURGERY    . REPLACEMENT TOTAL KNEE Left   . TOE SURGERY Left    Left great toe  . toenail removal Right    all 5 toenails  . TOTAL HIP ARTHROPLASTY    . TOTAL KNEE ARTHROPLASTY Right 12/09/2013   Procedure: Right Total Knee Arthroplasty;  Surgeon: Newt Minion, MD;  Location: [facility];  Service: Orthopedics;  Laterality: Right;  Marland Kitchen VESICOVAGINAL FISTULA CLOSURE W/ TAH     Social History   Social History Narrative  . Not on file  Immunization History  Administered Date(s) Administered  . Influenza Split 12/14/2010, 12/06/2012  . Influenza Whole 02/06/2004, 12/08/2009  . Influenza, High Dose Seasonal PF 10/26/2015, 12/11/2016, 01/06/2018, 10/07/2018  . Influenza,inj,Quad PF,6+ Mos 10/15/2013, 11/19/2014  . Influenza-Unspecified 10/07/2018  . PFIZER SARS-COV-2 Vaccination 03/01/2019, 03/21/2019  . Pneumococcal Conjugate-13 03/18/2013  . Pneumococcal Polysaccharide-23 06/30/2008, 10/26/2015  . Td 02/05/2005  . Tdap 12/14/2010     Objective: Vital Signs: There were no vitals taken for this visit.   Physical Exam   Musculoskeletal  Exam: ***  CDAI Exam: CDAI Score: -- Patient Global: --; Provider Global: -- Swollen: --; Tender: -- Joint Exam 09/28/2019   No joint exam has been documented for this visit   There is currently no information documented on the homunculus. Go to the Rheumatology activity and complete the homunculus joint exam.  Investigation: No additional findings.  Imaging: No results found.  Recent Labs: Lab Results  Component Value Date   WBC 6.3 07/25/2019   HGB 13.1 07/25/2019   PLT 277 07/25/2019   NA 135 07/25/2019   K 3.7 07/25/2019   CL 100 07/25/2019   CO2 26 07/25/2019   GLUCOSE 102 (H) 07/25/2019   BUN 16 07/25/2019   CREATININE 0.82 07/25/2019   BILITOT 0.7 07/25/2019   ALKPHOS 96 07/25/2019   AST 18 07/25/2019   ALT 11 07/25/2019   PROT 6.9 07/25/2019   ALBUMIN 3.7 07/25/2019   CALCIUM 8.9 07/25/2019   GFRAA >60 07/25/2019    Speciality Comments: PLQ eye exam: 02/10/2019 WNL @ [facility]. Follow up in 6 months.  Procedures:  No procedures performed Allergies: Penicillins   Assessment / Plan:     Visit Diagnoses: No diagnosis found.  Orders: No orders of the defined types were placed in this encounter.  No orders of the defined types were placed in this encounter.   Face-to-face time spent with patient was *** minutes. Greater than 50% of time was spent in counseling and coordination of care.  Follow-Up Instructions: No follow-ups on file.   Earnestine Mealing, CMA  Note - This record has been created using Editor, commissioning.  Chart creation errors have been sought, but may not always  have been located. Such creation errors do not reflect on  the standard of medical care.

## 2019-09-15 ENCOUNTER — Ambulatory Visit: Payer: Medicare HMO | Admitting: Physician Assistant

## 2019-09-28 ENCOUNTER — Ambulatory Visit: Payer: Medicare HMO | Admitting: Physician Assistant

## 2019-09-28 NOTE — Progress Notes (Signed)
Office Visit Note  Patient: Taylor Mccall             Date of Birth: [birth year]           MRN: [id number]             PCP: Dorothyann Peng, NP Referring: Dorothyann Peng, NP Visit Date: 09/29/2019 Occupation: @GUAROCC @  Subjective:  Low back pain   History of Present Illness: Taylor Mccall is a 74 y.o. female with history of autoimmune disease.  She is taking plaquenil 200 mg 1 tablet by mouth twice daily.  She is tolerating PLQ without any side effects.  She denies any signs or symptoms of an autoimmune disease flare recently.  She denies any recent rashes, increased photosensitivity, symptoms of Raynaud's, oral or nasal ulcerations, enlarged lymph nodes, chest pain, shortness of breath.  She continues to have chronic sicca symptoms.  She has been experiencing increased pain in her neck and lower back.  She had x-rays of the lumbar spine obtained on 07/28/2019 but has not followed up with her PCP to discuss proceeding with an MRI which was recommended by the radiologist.  She continues to have intermittent right-sided radiculopathy.  She has been experiencing nocturnal pain in her neck and lower back.  She has interrupted sleep at night which has been worsening her fatigue.   Activities of Daily Living:  Patient reports morning stiffness for several  hours.   Patient Reports nocturnal pain.  Difficulty dressing/grooming: Reports Difficulty climbing stairs: Reports Difficulty getting out of chair: Reports Difficulty using hands for taps, buttons, cutlery, and/or writing: Reports  Review of Systems  Constitutional: Positive for fatigue.  HENT: Positive for mouth dryness. Negative for mouth sores and nose dryness.   Eyes: Positive for itching and dryness. Negative for pain and visual disturbance.  Respiratory: Negative for cough, hemoptysis, shortness of breath and difficulty breathing.   Cardiovascular: Positive for swelling in legs/feet. Negative for chest pain, palpitations and  hypertension.  Gastrointestinal: Negative for blood in stool, constipation and diarrhea.  Endocrine: Positive for increased urination.  Genitourinary: Negative for difficulty urinating and painful urination.  Musculoskeletal: Positive for arthralgias, joint pain, morning stiffness and muscle tenderness. Negative for joint swelling, myalgias, muscle weakness and myalgias.  Skin: Negative for color change, pallor, rash, hair loss, nodules/bumps, redness, skin tightness, ulcers and sensitivity to sunlight.  Allergic/Immunologic: Negative for susceptible to infections.  Neurological: Negative for dizziness, numbness, memory loss and weakness.  Hematological: Negative for bruising/bleeding tendency and swollen glands.  Psychiatric/Behavioral: Negative for depressed mood, confusion and sleep disturbance. The patient is not nervous/anxious.     PMFS History:  Patient Active Problem List   Diagnosis Date Noted  . Routine general medical examination at a health care facility 12/11/2016  . Primary osteoarthritis of left hip 05/01/2016  . Autoimmune disease ([place]) 11/29/2015  . High risk medication use 11/29/2015  . Vitamin D deficiency 11/29/2015  . Macular degeneration 11/29/2015  . Bilateral lower extremity edema 10/26/2015  . H/O total knee replacement, bilateral 12/09/2013  . Breast mass, left 01/18/2011  . Obesity 12/08/2009  . Venous (peripheral) insufficiency 07/28/2009  . PAIN IN JOINT, ANKLE AND FOOT 01/12/2008  . Obstructive sleep apnea 08/29/2007  . GAIT DISTURBANCE 08/14/2007  . Depression 08/26/2006  . Essential hypertension 08/26/2006  . Primary osteoarthritis of both hands 08/26/2006  . Urinary incontinence 08/26/2006    Past Medical History:  Diagnosis Date  . Anxiety   . Blood transfusion without reported diagnosis   .  Breast mass, left   . Cataract   . Depression   . Gait disturbance   . GERD (gastroesophageal reflux disease)   . Hypertension   . Lymphedema of both  lower extremities    Seeing OT at [facility] to Left Lower leg  . Obesity   . Osteoarthritis   . Rheumatoid arthritis(714.0)   . Sleep apnea    cpap  . Urinary incontinence   . Venous insufficiency     Family History  Problem Relation Age of Onset  . Diabetes Other   . Stroke Other   . Asthma Brother   . Heart disease Brother   . Coronary artery disease Father   . Skin cancer Father   . Heart disease Father   . Coronary artery disease Brother   . Colon cancer Neg Hx   . Esophageal cancer Neg Hx   . Rectal cancer Neg Hx   . Stomach cancer Neg Hx    Past Surgical History:  Procedure Laterality Date  . ABDOMINAL HYSTERECTOMY    . BLADDER SURGERY     sling  . BLADDER SUSPENSION  2009  . BREAST REDUCTION SURGERY    . COLONOSCOPY    . ESOPHAGOGASTRODUODENOSCOPY    . EYE SURGERY    . REPLACEMENT TOTAL KNEE Left   . TOE SURGERY Left    Left great toe  . toenail removal Right    all 5 toenails  . TOTAL HIP ARTHROPLASTY    . TOTAL KNEE ARTHROPLASTY Right 12/09/2013   Procedure: Right Total Knee Arthroplasty;  Surgeon: Newt Minion, MD;  Location: [facility];  Service: Orthopedics;  Laterality: Right;  Marland Kitchen VESICOVAGINAL FISTULA CLOSURE W/ TAH     Social History   Social History Narrative  . Not on file   Immunization History  Administered Date(s) Administered  . Influenza Split 12/14/2010, 12/06/2012  . Influenza Whole 02/06/2004, 12/08/2009  . Influenza, High Dose Seasonal PF 10/26/2015, 12/11/2016, 01/06/2018, 10/07/2018  . Influenza,inj,Quad PF,6+ Mos 10/15/2013, 11/19/2014  . Influenza-Unspecified 10/07/2018  . PFIZER SARS-COV-2 Vaccination 03/01/2019, 03/21/2019  . Pneumococcal Conjugate-13 03/18/2013  . Pneumococcal Polysaccharide-23 06/30/2008, 10/26/2015  . Td 02/05/2005  . Tdap 12/14/2010     Objective: Vital Signs: BP 138/86 (BP Location: Left Arm, Patient Position: Sitting, Cuff Size: Large)   Pulse 78   Resp 17   Ht $R'5\' 10"'pB$   (1.778 m)   Wt 284 lb 6.4 oz (129 kg)   BMI 40.81 kg/m    Physical Exam Vitals and nursing note reviewed.  Constitutional:      Appearance: She is well-developed.  HENT:     Head: Normocephalic and atraumatic.  Eyes:     Conjunctiva/sclera: Conjunctivae normal.  Pulmonary:     Effort: Pulmonary effort is normal.  Abdominal:     Palpations: Abdomen is soft.  Musculoskeletal:     Cervical back: Normal range of motion.  Skin:    General: Skin is warm and dry.     Capillary Refill: Capillary refill takes less than 2 seconds.  Neurological:     Mental Status: She is alert and oriented to person, place, and time.  Psychiatric:        Behavior: Behavior normal.      Musculoskeletal Exam: C-spine limited ROM with discomfort.  Thoracic kyphosis noted.  Painful and limited ROM of lumbar spine.  Midline spinal tenderness in the lumbar region.  Shoulder joints, elbow joints, wrist joints, MCPs, PIPs, and DIPs good ROM with  no synovitis.  Complete fist formation bilaterally.  Bilateral knee replacements have good ROM with discomfort.  Lymphedema noted bilaterally.   CDAI Exam: CDAI Score: -- Patient Global: --; Provider Global: -- Swollen: --; Tender: -- Joint Exam 09/29/2019   No joint exam has been documented for this visit   There is currently no information documented on the homunculus. Go to the Rheumatology activity and complete the homunculus joint exam.  Investigation: No additional findings.  Imaging: XR Cervical Spine 2 or 3 views  Result Date: 09/29/2019 Multilevel spondylosis and facet joint arthropathy was noted.  Severe narrowing was noted between C3-C4, C4-C5, C5-C6. Impression: Multilevel severe spondylosis and facet joint arthropathy.   Recent Labs: Lab Results  Component Value Date   WBC 6.3 07/25/2019   HGB 13.1 07/25/2019   PLT 277 07/25/2019   NA 135 07/25/2019   K 3.7 07/25/2019   CL 100 07/25/2019   CO2 26 07/25/2019   GLUCOSE 102 (H) 07/25/2019    BUN 16 07/25/2019   CREATININE 0.82 07/25/2019   BILITOT 0.7 07/25/2019   ALKPHOS 96 07/25/2019   AST 18 07/25/2019   ALT 11 07/25/2019   PROT 6.9 07/25/2019   ALBUMIN 3.7 07/25/2019   CALCIUM 8.9 07/25/2019   GFRAA >60 07/25/2019    Speciality Comments: PLQ eye exam: 02/10/2019 WNL @ [facility]. Follow up in 6 months.  Procedures:  No procedures performed Allergies: Penicillins   Assessment / Plan:     Visit Diagnoses: Autoimmune disease ([place]) - +ANA, +RNP: She has not had any signs or symptoms of an autoimmune disease flare recently.  She is clinically doing well on Plaquenil 200 mg 1 tablet by mouth twice daily.  ESR within normal limits, complements within normal limits, double-stranded DNA was negative on 03/18/2019.  We will repeat autoimmune lab work today.  She has not had any recent rashes, increased photosensitivity, symptoms of Raynaud's, enlarged lymph nodes, chest pain, shortness of breath, or oral or nasal ulcerations.  She continues to have chronic sicca symptoms.  She has no synovitis on exam today.  She presents today with increased neck and lower back pain.  X-rays of the C-spine were obtained and x-rays of the lumbar spine were reviewed from 07/28/2019.  We will place an order for an MRI of the lumbar spine today.  We will also refer her to Dr. Louanne Skye for further evaluation and management.  She is not experiencing any other joint pain or inflammation at this time.  She is advised to notify us if she develops any new or worsening symptoms.  She will continue taking Plaquenil as prescribed.  She will follow-up in the office in 5 months.- Plan: Anti-DNA antibody, double-stranded, C3 and C4, Sedimentation rate, Urinalysis, Routine w reflex microscopic, CBC with Differential/Platelet, COMPLETE METABOLIC PANEL WITH GFR  High risk medication use - Plaquenil 200 mg 1 tablet by mouth twice daily.  PLQ eye exam: 02/10/2019.  CBC and BMP were drawn on 07/25/2019.  CBC and CMP will  be drawn today to monitor for drug toxicity- Plan: CBC with Differential/Platelet, COMPLETE METABOLIC PANEL WITH GFR She has received both COVID-19 vaccinations and is planning on receiving the booster dose.  She was encouraged to continue to wear a mask and social distance.  She was advised to notify us or her PCP if she develops a COVID-19 infection in order to receive the antibody infusion.  Primary osteoarthritis of both hands: She has PIP and DIP thickening consistent with osteoarthritis of both hands.  No tenderness or inflammation was noted.  She experiences intermittent discomfort and stiffness in both hands.  Joint protection and muscle strengthening were discussed.  Neck pain - She presents today with increased neck pain and stiffness.  She is not experiencing any symptoms of radiculopathy at this time.  X-rays of the C-spine were obtained today which revealed severe multilevel spondylosis and facet joint arthropathy.  She will be referred to Dr. Louanne Skye for further evaluation and management.  Plan: XR Cervical Spine 2 or 3 views  DDD (degenerative disc disease), cervical: X-rays of the C-spine were obtained today on 09/29/2019 which revealed severe multilevel spondylosis and facet joint arthropathy.  She was given a handout of neck exercises to perform.  She declined physical therapy at this time.  She will be referred to Dr. Louanne Skye.  DDD (degenerative disc disease), lumbar: She has been experiencing increased lower back pain and intermittent right-sided radiculopathy.  She had x-rays of the lumbar spine obtained on 07/28/2019 which revealed levoscoliosis of the thoracolumbar spine, moderate disc degeneration and severe multilevel facet degenerative change.  We will proceed with ordering MRI as recommended by Dr. Laqueta Carina (radiologist).  She will also be referred to Dr. Louanne Skye.  She was given a handout of back exercises and core strengthening exercises to perform.  She declined physical therapy at this  time.  A prescription for Lidoderm patches was also sent to the pharmacy today.  Chronic right shoulder pain - She had a right shoulder joint cortisone injection on 09/04/2018 which provided significant pain relief.  She is not having any discomfort at this time.  She has good ROM on exam today.    H/O total knee replacement, bilateral: Chronic pain.  She has painful ROM with warmth bilaterally.  She uses a cane to assist with ambulation.   Primary osteoarthritis of left hip: She is not experiencing any left hip discomfort at this time.   Other medical conditions are listed as follows:   History of macular degeneration  Vitamin D deficiency  History of depression  History of sleep apnea  Pedal edema  Orders: Orders Placed This Encounter  Procedures  . XR Cervical Spine 2 or 3 views  . Anti-DNA antibody, double-stranded  . C3 and C4  . Sedimentation rate  . Urinalysis, Routine w reflex microscopic  . CBC with Differential/Platelet  . COMPLETE METABOLIC PANEL WITH GFR   Meds ordered this encounter  Medications  . lidocaine (LIDODERM) 5 %    Sig: Place 1 patch onto the skin daily. Remove & Discard patch within 12 hours or as directed by MD    Dispense:  30 patch    Refill:  0    Face-to-face time spent with patient was 30 minutes. Greater than 50% of time was spent in counseling and coordination of care.  Follow-Up Instructions: Return in about 5 months (around 02/29/2020) for Autoimmune Disease.   Ofilia Neas, PA-C  Note - This record has been created using Dragon software.  Chart creation errors have been sought, but may not always  have been located. Such creation errors do not reflect on  the standard of medical care.

## 2019-09-29 ENCOUNTER — Encounter: Payer: Self-pay | Admitting: Physician Assistant

## 2019-09-29 ENCOUNTER — Other Ambulatory Visit: Payer: Self-pay

## 2019-09-29 ENCOUNTER — Ambulatory Visit (INDEPENDENT_AMBULATORY_CARE_PROVIDER_SITE_OTHER): Payer: Medicare HMO | Admitting: Physician Assistant

## 2019-09-29 ENCOUNTER — Ambulatory Visit (INDEPENDENT_AMBULATORY_CARE_PROVIDER_SITE_OTHER): Payer: Medicare HMO

## 2019-09-29 ENCOUNTER — Other Ambulatory Visit: Payer: Self-pay | Admitting: *Deleted

## 2019-09-29 VITALS — BP 138/86 | HR 78 | Resp 17 | Ht 70.0 in | Wt 284.4 lb

## 2019-09-29 DIAGNOSIS — Z8669 Personal history of other diseases of the nervous system and sense organs: Secondary | ICD-10-CM | POA: Diagnosis not present

## 2019-09-29 DIAGNOSIS — Z79899 Other long term (current) drug therapy: Secondary | ICD-10-CM | POA: Diagnosis not present

## 2019-09-29 DIAGNOSIS — M19041 Primary osteoarthritis, right hand: Secondary | ICD-10-CM

## 2019-09-29 DIAGNOSIS — M5030 Other cervical disc degeneration, unspecified cervical region: Secondary | ICD-10-CM

## 2019-09-29 DIAGNOSIS — M359 Systemic involvement of connective tissue, unspecified: Secondary | ICD-10-CM

## 2019-09-29 DIAGNOSIS — M51369 Other intervertebral disc degeneration, lumbar region without mention of lumbar back pain or lower extremity pain: Secondary | ICD-10-CM

## 2019-09-29 DIAGNOSIS — M542 Cervicalgia: Secondary | ICD-10-CM

## 2019-09-29 DIAGNOSIS — M5136 Other intervertebral disc degeneration, lumbar region: Secondary | ICD-10-CM | POA: Diagnosis not present

## 2019-09-29 DIAGNOSIS — Z8659 Personal history of other mental and behavioral disorders: Secondary | ICD-10-CM

## 2019-09-29 DIAGNOSIS — M1612 Unilateral primary osteoarthritis, left hip: Secondary | ICD-10-CM

## 2019-09-29 DIAGNOSIS — E559 Vitamin D deficiency, unspecified: Secondary | ICD-10-CM

## 2019-09-29 DIAGNOSIS — Z96653 Presence of artificial knee joint, bilateral: Secondary | ICD-10-CM | POA: Diagnosis not present

## 2019-09-29 DIAGNOSIS — R600 Localized edema: Secondary | ICD-10-CM

## 2019-09-29 DIAGNOSIS — M19042 Primary osteoarthritis, left hand: Secondary | ICD-10-CM

## 2019-09-29 DIAGNOSIS — M25511 Pain in right shoulder: Secondary | ICD-10-CM

## 2019-09-29 DIAGNOSIS — G8929 Other chronic pain: Secondary | ICD-10-CM

## 2019-09-29 MED ORDER — LIDOCAINE 5 % EX PTCH: 1.0000 | MEDICATED_PATCH | CUTANEOUS | 0 refills | Status: DC

## 2019-09-29 NOTE — Patient Instructions (Signed)
Core Strength Exercises  Core exercises help to build strength in the muscles between your ribs and your hips (abdominal muscles). These muscles help to support your body and keep your spine stable. It is important to maintain strength in your core to prevent injury and pain. Some activities, such as yoga and Pilates, can help to strengthen core muscles. You can also strengthen core muscles with exercises at home. It is important to talk to your health care provider before you start a new exercise routine. What are the benefits of core strength exercises? Core strength exercises can:  Reduce back pain.  Help to rebuild strength after a back or spine injury.  Help to prevent injury during physical activity, especially injuries to the back and knees. How to do core strength exercises Repeat these exercises 10-15 times, or until you are tired. Do exercises exactly as told by your health care provider and adjust them as directed. It is normal to feel mild stretching, pulling, tightness, or discomfort as you do these exercises. If you feel any pain while doing these exercises, stop. If your pain continues or gets worse when doing core exercises, contact your health care provider. You may want to use a padded yoga or exercise mat for strength exercises that are done on the floor. Bridging  1. Lie on your back on a firm surface with your knees bent and your feet flat on the floor. 2. Raise your hips so that your knees, hips, and shoulders form a straight line together. Keep your abdominal muscles tight. 3. Hold this position for 3-5 seconds. 4. Slowly lower your hips to the starting position. 5. Let your muscles relax completely between repetitions. Single-leg bridge 1. Lie on your back on a firm surface with your knees bent and your feet flat on the floor. 2. Raise your hips so that your knees, hips, and shoulders form a straight line together. Keep your abdominal muscles tight. 3. Lift one foot  off the floor, then completely straighten that leg. 4. Hold this position for 3-5 seconds. 5. Put the straight leg back down in the bent position. 6. Slowly lower your hips to the starting position. 7. Repeat these steps using your other leg. Side bridge 1. Lie on your side with your knees bent. Prop yourself up on the elbow that is near the floor. 2. Using your abdominal muscles and your elbow that is on the floor, raise your body off the floor. Raise your hip so that your shoulder, hip, and foot form a straight line together. 3. Hold this position for 10 seconds. Keep your head and neck raised and away from your shoulder (in their normal, neutral position). Keep your abdominal muscles tight. 4. Slowly lower your hip to the starting position. 5. Repeat and try to hold this position longer, working your way up to 30 seconds. Abdominal crunch 1. Lie on your back on a firm surface. Bend your knees and keep your feet flat on the floor. 2. Cross your arms over your chest. 3. Without bending your neck, tip your chin slightly toward your chest. 4. Tighten your abdominal muscles as you lift your chest just high enough to lift your shoulder blades off of the floor. Do not hold your breath. You can do this with short lifts or long lifts. 5. Slowly return to the starting position. Bird dog 1. Get on your hands and knees, with your legs shoulder-width apart and your arms under your shoulders. Keep your back straight. 2. Tighten  your abdominal muscles. 3. Raise one of your legs off the floor and straighten it. Try to keep it parallel to the floor. 4. Slowly lower your leg to the starting position. 5. Raise one of your arms off the floor and straighten it. Try to keep it parallel to the floor. 6. Slowly lower your arm to the starting position. 7. Repeat with the other arm and leg. If possible, try raising a leg and arm at the same time, on opposite sides of the body. For example, raise your left hand and  your right leg. Plank 1. Lie on your belly. 2. Prop up your body onto your forearms and your feet, keeping your legs straight. Your body should make a straight line between your shoulders and feet. 3. Hold this position for 10 seconds while keeping your abdominal muscles tight. 4. Lower your body to the starting position. 5. Repeat and try to hold this position longer, working your way up to 30 seconds. Cross-core strengthening 1. Stand with your feet shoulder-width apart. 2. Hold a ball out in front of you. Keep your arms straight. 3. Tighten your abdominal muscles and slowly rotate at your waist from side to side. Keep your feet flat. 4. Once you are comfortable, try repeating this exercise with a heavier ball. Top core strengthening 1. Stand about 18 inches (46 cm) in front of a wall, with your back to the wall. 2. Keep your feet flat and shoulder-width apart. 3. Tighten your abdominal muscles. 4. Bend your hips and knees. 5. Slowly reach between your legs to touch the wall behind you. 6. Slowly stand back up. 7. Raise your arms over your head and reach behind you. 8. Return to the starting position. General tips  Do not do any exercises that cause pain. If you have pain while exercising, talk to your health care provider.  Always stretch before and after doing these exercises. This can help prevent injury.  Maintain a healthy weight. Ask your health care provider what weight is healthy for you. Contact a health care provider if:  You have back pain that gets worse or does not go away.  You feel pain while doing core strength exercises. Get help right away if:  You have severe pain that does not get better with medicine. Summary  Core exercises help to build strength in the muscles between your ribs and your waist.  Core muscles help to support your body and keep your spine stable.  Some activities, such as yoga and Pilates, can help to strengthen core muscles.  Core  strength exercises can help back pain and can prevent injury.  If you feel any pain while doing core strength exercises, stop. This information is not intended to replace advice given to you by your health care provider. Make sure you discuss any questions you have with your health care provider. Document Revised: 05/14/2018 Document Reviewed: 06/13/2016 Elsevier Patient Education  2020 [employer] Back Exercises These exercises help to make your trunk and back strong. They also help to keep the lower back flexible. Doing these exercises can help to prevent back pain or lessen existing pain.  If you have back pain, try to do these exercises 2-3 times each day or as told by your doctor.  As you get better, do the exercises once each day. Repeat the exercises more often as told by your doctor.  To stop back pain from coming back, do the exercises once each day, or as told by your   doctor. Exercises Single knee to chest Do these steps 3-5 times in a row for each leg: 1. Lie on your back on a firm bed or the floor with your legs stretched out. 2. Bring one knee to your chest. 3. Grab your knee or thigh with both hands and hold them it in place. 4. Pull on your knee until you feel a gentle stretch in your lower back or buttocks. 5. Keep doing the stretch for 10-30 seconds. 6. Slowly let go of your leg and straighten it. Pelvic tilt Do these steps 5-10 times in a row: 1. Lie on your back on a firm bed or the floor with your legs stretched out. 2. Bend your knees so they point up to the ceiling. Your feet should be flat on the floor. 3. Tighten your lower belly (abdomen) muscles to press your lower back against the floor. This will make your tailbone point up to the ceiling instead of pointing down to your feet or the floor. 4. Stay in this position for 5-10 seconds while you gently tighten your muscles and breathe evenly. Cat-cow Do these steps until your lower back bends more  easily: 1. Get on your hands and knees on a firm surface. Keep your hands under your shoulders, and keep your knees under your hips. You may put padding under your knees. 2. Let your head hang down toward your chest. Tighten (contract) the muscles in your belly. Point your tailbone toward the floor so your lower back becomes rounded like the back of a cat. 3. Stay in this position for 5 seconds. 4. Slowly lift your head. Let the muscles of your belly relax. Point your tailbone up toward the ceiling so your back forms a sagging arch like the back of a cow. 5. Stay in this position for 5 seconds.  Press-ups Do these steps 5-10 times in a row: 1. Lie on your belly (face-down) on the floor. 2. Place your hands near your head, about shoulder-width apart. 3. While you keep your back relaxed and keep your hips on the floor, slowly straighten your arms to raise the top half of your body and lift your shoulders. Do not use your back muscles. You may change where you place your hands in order to make yourself more comfortable. 4. Stay in this position for 5 seconds. 5. Slowly return to lying flat on the floor.  Bridges Do these steps 10 times in a row: 1. Lie on your back on a firm surface. 2. Bend your knees so they point up to the ceiling. Your feet should be flat on the floor. Your arms should be flat at your sides, next to your body. 3. Tighten your butt muscles and lift your butt off the floor until your waist is almost as high as your knees. If you do not feel the muscles working in your butt and the back of your thighs, slide your feet 1-2 inches farther away from your butt. 4. Stay in this position for 3-5 seconds. 5. Slowly lower your butt to the floor, and let your butt muscles relax. If this exercise is too easy, try doing it with your arms crossed over your chest. Belly crunches Do these steps 5-10 times in a row: 1. Lie on your back on a firm bed or the floor with your legs stretched  out. 2. Bend your knees so they point up to the ceiling. Your feet should be flat on the floor. 3. Cross your arms over   your chest. 4. Tip your chin a little bit toward your chest but do not bend your neck. 5. Tighten your belly muscles and slowly raise your chest just enough to lift your shoulder blades a tiny bit off of the floor. Avoid raising your body higher than that, because it can put too much stress on your low back. 6. Slowly lower your chest and your head to the floor. Back lifts Do these steps 5-10 times in a row: 1. Lie on your belly (face-down) with your arms at your sides, and rest your forehead on the floor. 2. Tighten the muscles in your legs and your butt. 3. Slowly lift your chest off of the floor while you keep your hips on the floor. Keep the back of your head in line with the curve in your back. Look at the floor while you do this. 4. Stay in this position for 3-5 seconds. 5. Slowly lower your chest and your face to the floor. Contact a doctor if:  Your back pain gets a lot worse when you do an exercise.  Your back pain does not get better 2 hours after you exercise. If you have any of these problems, stop doing the exercises. Do not do them again unless your doctor says it is okay. Get help right away if:  You have sudden, very bad back pain. If this happens, stop doing the exercises. Do not do them again unless your doctor says it is okay. This information is not intended to replace advice given to you by your health care provider. Make sure you discuss any questions you have with your health care provider. Document Revised: 10/17/2017 Document Reviewed: 10/17/2017 Elsevier Patient Education  [place]. Neck Exercises Ask your health care provider which exercises are safe for you. Do exercises exactly as told by your health care provider and adjust them as directed. It is normal to feel mild stretching, pulling, tightness, or discomfort as you do these  exercises. Stop right away if you feel sudden pain or your pain gets worse. Do not begin these exercises until told by your health care provider. Neck exercises can be important for many reasons. They can improve strength and maintain flexibility in your neck, which will help your upper back and prevent neck pain. Stretching exercises Rotation neck stretching  1. Sit in a chair or stand up. 2. Place your feet flat on the floor, shoulder width apart. 3. Slowly turn your head (rotate) to the right until a slight stretch is felt. Turn it all the way to the right so you can look over your right shoulder. Do not tilt or tip your head. 4. Hold this position for 10-30 seconds. 5. Slowly turn your head (rotate) to the left until a slight stretch is felt. Turn it all the way to the left so you can look over your left shoulder. Do not tilt or tip your head. 6. Hold this position for 10-30 seconds. Repeat __________ times. Complete this exercise __________ times a day. Neck retraction 1. Sit in a sturdy chair or stand up. 2. Look straight ahead. Do not bend your neck. 3. Use your fingers to push your chin backward (retraction). Do not bend your neck for this movement. Continue to face straight ahead. If you are doing the exercise properly, you will feel a slight sensation in your throat and a stretch at the back of your neck. 4. Hold the stretch for 1-2 seconds. Repeat __________ times. Complete this exercise __________  times a day. Strengthening exercises Neck press 1. Lie on your back on a firm bed or on the floor with a pillow under your head. 2. Use your neck muscles to push your head down on the pillow and straighten your spine. 3. Hold the position as well as you can. Keep your head facing up (in a neutral position) and your chin tucked. 4. Slowly count to 5 while holding this position. Repeat __________ times. Complete this exercise __________ times a day. Isometrics These are exercises in  which you strengthen the muscles in your neck while keeping your neck still (isometrics). 1. Sit in a supportive chair and place your hand on your forehead. 2. Keep your head and face facing straight ahead. Do not flex or extend your neck while doing isometrics. 3. Push forward with your head and neck while pushing back with your hand. Hold for 10 seconds. 4. Do the sequence again, this time putting your hand against the back of your head. Use your head and neck to push backward against the hand pressure. 5. Finally, do the same exercise on either side of your head, pushing sideways against the pressure of your hand. Repeat __________ times. Complete this exercise __________ times a day. Prone head lifts 1. Lie face-down (prone position), resting on your elbows so that your chest and upper back are raised. 2. Start with your head facing downward, near your chest. Position your chin either on or near your chest. 3. Slowly lift your head upward. Lift until you are looking straight ahead. Then continue lifting your head as far back as you can comfortably stretch. 4. Hold your head up for 5 seconds. Then slowly lower it to your starting position. Repeat __________ times. Complete this exercise __________ times a day. Supine head lifts 1. Lie on your back (supine position), bending your knees to point to the ceiling and keeping your feet flat on the floor. 2. Lift your head slowly off the floor, raising your chin toward your chest. 3. Hold for 5 seconds. Repeat __________ times. Complete this exercise __________ times a day. Scapular retraction 1. Stand with your arms at your sides. Look straight ahead. 2. Slowly pull both shoulders (scapulae) backward and downward (retraction) until you feel a stretch between your shoulder blades in your upper back. 3. Hold for 10-30 seconds. 4. Relax and repeat. Repeat __________ times. Complete this exercise __________ times a day. Contact a health care  provider if:  Your neck pain or discomfort gets much worse when you do an exercise.  Your neck pain or discomfort does not improve within 2 hours after you exercise. If you have any of these problems, stop exercising right away. Do not do the exercises again unless your health care provider says that you can. Get help right away if:  You develop sudden, severe neck pain. If this happens, stop exercising right away. Do not do the exercises again unless your health care provider says that you can. This information is not intended to replace advice given to you by your health care provider. Make sure you discuss any questions you have with your health care provider. Document Revised: 11/20/2017 Document Reviewed: 11/20/2017 Elsevier Patient Education  [place].

## 2019-09-30 LAB — CBC WITH DIFFERENTIAL/PLATELET
Absolute Monocytes: 464 cells/uL (ref 200–950)
Basophils Absolute: 59 cells/uL (ref 0–200)
Basophils Relative: 1.1 %
Eosinophils Absolute: 340 cells/uL (ref 15–500)
Eosinophils Relative: 6.3 %
HCT: 38.4 % (ref 35.0–45.0)
Hemoglobin: 12.8 g/dL (ref 11.7–15.5)
Lymphs Abs: 1269 cells/uL (ref 850–3900)
MCH: 29.3 pg (ref 27.0–33.0)
MCHC: 33.3 g/dL (ref 32.0–36.0)
MCV: 87.9 fL (ref 80.0–100.0)
MPV: 11.7 fL (ref 7.5–12.5)
Monocytes Relative: 8.6 %
Neutro Abs: 3267 cells/uL (ref 1500–7800)
Neutrophils Relative %: 60.5 %
Platelets: 268 10*3/uL (ref 140–400)
RBC: 4.37 10*6/uL (ref 3.80–5.10)
RDW: 12 % (ref 11.0–15.0)
Total Lymphocyte: 23.5 %
WBC: 5.4 10*3/uL (ref 3.8–10.8)

## 2019-09-30 LAB — URINALYSIS, ROUTINE W REFLEX MICROSCOPIC
Bacteria, UA: NONE SEEN /HPF
Bilirubin Urine: NEGATIVE
Glucose, UA: NEGATIVE
Hgb urine dipstick: NEGATIVE
Hyaline Cast: NONE SEEN /LPF
Ketones, ur: NEGATIVE
Nitrite: NEGATIVE
Protein, ur: NEGATIVE
RBC / HPF: NONE SEEN /HPF (ref 0–2)
Specific Gravity, Urine: 1.023 (ref 1.001–1.03)
WBC, UA: NONE SEEN /HPF (ref 0–5)
pH: 5.5 (ref 5.0–8.0)

## 2019-09-30 LAB — SEDIMENTATION RATE: Sed Rate: 6 mm/h (ref 0–30)

## 2019-09-30 LAB — C3 AND C4
C3 Complement: 136 mg/dL (ref 83–193)
C4 Complement: 36 mg/dL (ref 15–57)

## 2019-09-30 LAB — COMPLETE METABOLIC PANEL WITH GFR
AG Ratio: 1.4 (calc) (ref 1.0–2.5)
ALT: 7 U/L (ref 6–29)
AST: 13 U/L (ref 10–35)
Albumin: 4.1 g/dL (ref 3.6–5.1)
Alkaline phosphatase (APISO): 112 U/L (ref 37–153)
BUN: 14 mg/dL (ref 7–25)
CO2: 25 mmol/L (ref 20–32)
Calcium: 9.4 mg/dL (ref 8.6–10.4)
Chloride: 105 mmol/L (ref 98–110)
Creat: 0.73 mg/dL (ref 0.60–0.93)
GFR, Est African American: 93 mL/min/{1.73_m2} (ref >=60)
GFR, Est Non African American: 81 mL/min/{1.73_m2} (ref >=60)
Globulin: 3 g/dL (calc) (ref 1.9–3.7)
Glucose, Bld: 83 mg/dL (ref 65–99)
Potassium: 4 mmol/L (ref 3.5–5.3)
Sodium: 139 mmol/L (ref 135–146)
Total Bilirubin: 0.7 mg/dL (ref 0.2–1.2)
Total Protein: 7.1 g/dL (ref 6.1–8.1)

## 2019-09-30 LAB — ANTI-DNA ANTIBODY, DOUBLE-STRANDED: ds DNA Ab: 1 IU/mL

## 2019-09-30 NOTE — Progress Notes (Signed)
CBC and CMP WNL.  UA reveals leukocytes. Negative for nitrites. Please the patient to follow up with PCP if she is having symptoms of a UTI.  DsDNA is negative. Complements WNL.  ESR WNL.   Labs are not consistent with a flare.  No change in therapy recommended.

## 2019-10-02 ENCOUNTER — Telehealth: Payer: Self-pay | Admitting: *Deleted

## 2019-10-02 ENCOUNTER — Telehealth: Payer: Self-pay | Admitting: Rheumatology

## 2019-10-02 DIAGNOSIS — G4733 Obstructive sleep apnea (adult) (pediatric): Secondary | ICD-10-CM | POA: Diagnosis not present

## 2019-10-02 NOTE — Telephone Encounter (Signed)
Submitted a Prior Authorization request to [facility] for  Lidocaine Patches  via CoverMyMeds. Will update once we receive a response.     

## 2019-10-02 NOTE — Telephone Encounter (Signed)
Please notify patient that the prior authorization was denied.  She can try OTC salonpas patches or biofreeze.

## 2019-10-02 NOTE — Telephone Encounter (Signed)
Attempted to contact the patient and left message for patient to call the office.  

## 2019-10-02 NOTE — Telephone Encounter (Signed)
Received a fax regarding Prior Authorization from [place] for Lidocaine Patches. Authorization has been DENIED because it is not being prescribed for pain associated with post herpetic neuralgia, pain associated with diabetic neuropathy or pain associated with cancer-related neuropathy.

## 2019-10-02 NOTE — Telephone Encounter (Signed)
Patient advised that the prior authorization was denied.  Patient advised she can try OTC salonpas patches or biofreeze.

## 2019-10-06 ENCOUNTER — Telehealth: Payer: Self-pay | Admitting: Rheumatology

## 2019-10-06 NOTE — Telephone Encounter (Signed)
Insurance is denying MRI due to conservative treatment, inj, etc.

## 2019-10-06 NOTE — Telephone Encounter (Signed)
LMOM for patient to call to discuss MRI results.

## 2019-10-06 NOTE — Telephone Encounter (Signed)
She has an upcoming appointment with Dr. Louanne Skye for further evaluation and recommendations.    Please call the patient to notify her that her insurance has denied the MRIs.  Physical therapy is recommended.  Please clarify the patient would be willing to try PT.

## 2019-10-06 NOTE — Telephone Encounter (Signed)
MRI's cancelled. Patient wants to see Dr. Louanne Skye before beginning physical therapy. Patient has tried over the counter lidocaine patches which are not helping. Patient is requesting that you resend PA with this over the counter information. Thank you.

## 2019-10-06 NOTE — Telephone Encounter (Signed)
[employer] unable to approve MRI Cspine / MRI Lspine. Requesting for doctor to schedule a Peer to Peer. Please call [phone].  Please reference case # [id number]

## 2019-10-06 NOTE — Telephone Encounter (Signed)
Peer-to-Peer scheduled 10/07/2019 at 11:30.

## 2019-10-07 NOTE — Telephone Encounter (Signed)
Patient advised per insurance denial they will only cover the lidocaine patches if prescribed for pain associated with post herpetic neuralgia, pain associated with diabetic neuropathy or pain associated with cancer-related neuropathy. Patient verbalized understanding.

## 2019-10-09 NOTE — Telephone Encounter (Signed)
Opened in error

## 2019-10-14 DIAGNOSIS — G4733 Obstructive sleep apnea (adult) (pediatric): Secondary | ICD-10-CM | POA: Diagnosis not present

## 2019-10-20 DIAGNOSIS — H401132 Primary open-angle glaucoma, bilateral, moderate stage: Secondary | ICD-10-CM | POA: Diagnosis not present

## 2019-10-20 DIAGNOSIS — M069 Rheumatoid arthritis, unspecified: Secondary | ICD-10-CM | POA: Diagnosis not present

## 2019-10-20 DIAGNOSIS — Z79899 Other long term (current) drug therapy: Secondary | ICD-10-CM | POA: Diagnosis not present

## 2019-10-20 DIAGNOSIS — H353112 Nonexudative age-related macular degeneration, right eye, intermediate dry stage: Secondary | ICD-10-CM | POA: Diagnosis not present

## 2019-10-21 ENCOUNTER — Ambulatory Visit (INDEPENDENT_AMBULATORY_CARE_PROVIDER_SITE_OTHER): Payer: Medicare HMO

## 2019-10-21 ENCOUNTER — Encounter: Payer: Self-pay | Admitting: Specialist

## 2019-10-21 ENCOUNTER — Ambulatory Visit (INDEPENDENT_AMBULATORY_CARE_PROVIDER_SITE_OTHER): Payer: Medicare HMO | Admitting: Specialist

## 2019-10-21 VITALS — BP 123/85 | HR 79 | Ht 70.0 in | Wt 285.0 lb

## 2019-10-21 DIAGNOSIS — M545 Low back pain, unspecified: Secondary | ICD-10-CM

## 2019-10-21 DIAGNOSIS — R2689 Other abnormalities of gait and mobility: Secondary | ICD-10-CM

## 2019-10-21 DIAGNOSIS — M542 Cervicalgia: Secondary | ICD-10-CM | POA: Diagnosis not present

## 2019-10-21 DIAGNOSIS — M4712 Other spondylosis with myelopathy, cervical region: Secondary | ICD-10-CM

## 2019-10-21 DIAGNOSIS — W19XXXD Unspecified fall, subsequent encounter: Secondary | ICD-10-CM | POA: Diagnosis not present

## 2019-10-21 DIAGNOSIS — G1220 Motor neuron disease, unspecified: Secondary | ICD-10-CM

## 2019-10-21 DIAGNOSIS — R29898 Other symptoms and signs involving the musculoskeletal system: Secondary | ICD-10-CM

## 2019-10-21 MED ORDER — ALPRAZOLAM 0.25 MG PO TABS
ORAL_TABLET | ORAL | 0 refills | Status: DC
Start: 2019-10-21 — End: 2019-11-26

## 2019-10-21 MED ORDER — GABAPENTIN 100 MG PO CAPS: 100.0000 mg | ORAL_CAPSULE | Freq: Every day | ORAL | 3 refills | Status: DC

## 2019-10-21 MED ORDER — BACLOFEN 10 MG PO TABS: 10.0000 mg | ORAL_TABLET | Freq: Two times a day (BID) | ORAL | 0 refills | Status: DC

## 2019-10-21 NOTE — Progress Notes (Signed)
Office Visit Note   Patient: Taylor Mccall           Date of Birth: 1945-07-02           MRN: [id number] Visit Date: 10/21/2019              Requested by: Bo Merino, MD [address] [place],  [place] [address] PCP: Dorothyann Peng, NP   Assessment & Plan: Visit Diagnoses:  1. Cervicalgia   2. Low back pain, unspecified back pain laterality, unspecified chronicity, unspecified whether sciatica present   3. Motor neuron disease ([place])   4. Upper extremity weakness   5. Falls, subsequent encounter   6. Balance disorder   7. Other spondylosis with myelopathy, cervical region     Plan: Avoid overhead lifting and overhead use of the arms. Do not lift greater than 5 lbs. Adjust head rest in vehicle to prevent hyperextension if rear ended. Take extra precautions to avoid falling. Start gabapentin 100mg  po qhs. MRI of the cervical spine to assess for cervical stenosis and cord compression due to recent falls, worseing  Upper extremity weakness and loss of balance and coordination. Baclofen 10 mg po BID for muscle spasm in the legs.   Follow-Up Instructions: Return in about 3 weeks (around 11/11/2019).   Orders:  Orders Placed This Encounter  Procedures  . XR Cervical Spine 2 or 3 views  . XR Lumb Spine Flex&Ext Only  . MR Cervical Spine w/o contrast   Meds ordered this encounter  Medications  . ALPRAZolam (XANAX) 0.25 MG tablet    Sig: Take one tablet at the MRI facility after signing paper work, may repeat after 30 minutes    Dispense:  30 tablet    Refill:  0  . gabapentin (NEURONTIN) 100 MG capsule    Sig: Take 1 capsule (100 mg total) by mouth at bedtime.    Dispense:  30 capsule    Refill:  3  . baclofen (LIORESAL) 10 MG tablet    Sig: Take 1 tablet (10 mg total) by mouth 2 (two) times daily.    Dispense:  30 each    Refill:  0      Procedures: No procedures performed   Clinical Data: No additional findings.   Subjective: Chief  Complaint  Patient presents with  . Neck - Pain  . Lower Back - Pain    74 year old female with history of RA, seening Dr. Estanislado Pandy and was referred for pain in her neck and lumbar spine that has been ongoing for years. The neck pain is present upper posterior at with radiation into the right periauricular area. Taylor Mccall had xrays and was called by Dr. Estanislado Pandy to have a follow up here. Her neck grating sensation with cracking and popping sensation and Taylor Mccall can hear The motion. There is neck stiffness and Taylor Mccall notices pain into the right shoulder. Taylor Mccall uses a heating pad for the neck and the right shoulder and this helps. Taylor Mccall feels some weakness in the arms and Taylor Mccall is unable to do things Taylor Mccall was able to do, Reaching and raising the arms into the cabinets and getting items out of the cabinet is difficult. Taylor Mccall has been dropping items and broke a Casserole dish last week. Has notice difficulty with holding her urine with urgency and is using  Depends now, as Taylor Mccall has a difficulty time getting to the bathroom. Taylor Mccall fell the first part of the summer, Taylor Mccall had a fall outside working  the yard and placing a shepard's pole and fell losing her  Balance and Taylor Mccall couldn't get up. The [employer] came out and helped to get her up. Using a rollator or a cane to get up. Taylor Mccall feels like this is getting worse, previous right TKR and left knee was done By Dr. Telford Nab?. Prevous left hip replacement, pins in the hips in the past at age 30. Dr. Maryjean Ka did left hip replacement with removal of pins in the left hip.     Review of Systems  Constitutional: Negative.   HENT: Negative.   Eyes: Negative.   Respiratory: Negative.   Cardiovascular: Negative.   Gastrointestinal: Negative.   Endocrine: Negative.   Genitourinary: Negative.   Musculoskeletal: Negative.   Skin: Negative.   Allergic/Immunologic: Negative.   Neurological: Negative.   Hematological: Negative.   Psychiatric/Behavioral: Negative.      Objective: Vital  Signs: BP 123/85 (BP Location: Left Arm, Patient Position: Sitting)   Pulse 79   Ht 5\' 10"  (1.778 m)   Wt 285 lb (129.3 kg)   BMI 40.89 kg/m   Physical Exam Constitutional:      Appearance: Taylor Mccall is well-developed.  HENT:     Head: Normocephalic and atraumatic.  Eyes:     Pupils: Pupils are equal, round, and reactive to light.  Pulmonary:     Effort: Pulmonary effort is normal.     Breath sounds: Normal breath sounds.  Abdominal:     General: Bowel sounds are normal.     Palpations: Abdomen is soft.  Musculoskeletal:     Cervical back: Normal range of motion and neck supple.     Lumbar back: Negative right straight leg raise test and negative left straight leg raise test.  Skin:    General: Skin is warm and dry.  Neurological:     Mental Status: Taylor Mccall is alert and oriented to person, place, and time.  Psychiatric:        Behavior: Behavior normal.        Thought Content: Thought content normal.        Judgment: Judgment normal.     Back Exam   Tenderness  The patient is experiencing tenderness in the cervical and lumbar.  Range of Motion  Extension: abnormal  Flexion: abnormal  Lateral bend right: abnormal  Lateral bend left: abnormal  Rotation right: abnormal  Rotation left: abnormal   Muscle Strength  The patient has normal back strength. Right Quadriceps:  5/5  Left Quadriceps:  5/5  Right Hamstrings:  5/5  Left Hamstrings:  5/5   Tests  Straight leg raise right: negative Straight leg raise left: negative  Other  Toe walk: abnormal Heel walk: abnormal Sensation: normal Gait: normal   Comments:  Cervical spine with loss of motion right latera bend and rotation by 70% and left bending and rotation by about 50% extension is limited by 60% flexion by 20%.      Specialty Comments:  No specialty comments available.  Imaging: XR Lumb Spine Flex&Ext Only  Result Date: 10/21/2019 Lateral flexion and extension radiographs demonstrate grade 1  anterolisthesis L4-5, minimal anterolisthesis L3-4 and L5-S1.   XR Cervical Spine 2 or 3 views  Result Date: 10/21/2019 AP and lateral radiographs of the cervical spine demonstrate DDD diffuse C2-3, C3-4, C4-5 and C5-6. There is ankylosis of the disc space C4-5 and C5-6. Straightening of the normal cervical lordosis. No instability noted. Torg ratio is reduced at C3-4 through C5-6. C6-7 and C7-8 are not  well seen on the lateral due to body habitus. Torg ratio is 0.5 at C3-4 and C4-5 and C5-6.     PMFS History: Patient Active Problem List   Diagnosis Date Noted  . Routine general medical examination at a health care facility 12/11/2016  . Primary osteoarthritis of left hip 05/01/2016  . Autoimmune disease ([place]) 11/29/2015  . High risk medication use 11/29/2015  . Vitamin D deficiency 11/29/2015  . Macular degeneration 11/29/2015  . Bilateral lower extremity edema 10/26/2015  . H/O total knee replacement, bilateral 12/09/2013  . Breast mass, left 01/18/2011  . Obesity 12/08/2009  . Venous (peripheral) insufficiency 07/28/2009  . PAIN IN JOINT, ANKLE AND FOOT 01/12/2008  . Obstructive sleep apnea 08/29/2007  . GAIT DISTURBANCE 08/14/2007  . Depression 08/26/2006  . Essential hypertension 08/26/2006  . Primary osteoarthritis of both hands 08/26/2006  . Urinary incontinence 08/26/2006   Past Medical History:  Diagnosis Date  . Anxiety   . Blood transfusion without reported diagnosis   . Breast mass, left   . Cataract   . Depression   . Gait disturbance   . GERD (gastroesophageal reflux disease)   . Hypertension   . Lymphedema of both lower extremities    Seeing OT at [facility] to Left Lower leg  . Obesity   . Osteoarthritis   . Rheumatoid arthritis(714.0)   . Sleep apnea    cpap  . Urinary incontinence   . Venous insufficiency     Family History  Problem Relation Age of Onset  . Diabetes Other   . Stroke Other   . Asthma Brother   . Heart  disease Brother   . Coronary artery disease Father   . Skin cancer Father   . Heart disease Father   . Coronary artery disease Brother   . Colon cancer Neg Hx   . Esophageal cancer Neg Hx   . Rectal cancer Neg Hx   . Stomach cancer Neg Hx     Past Surgical History:  Procedure Laterality Date  . ABDOMINAL HYSTERECTOMY    . BLADDER SURGERY     sling  . BLADDER SUSPENSION  2009  . BREAST REDUCTION SURGERY    . COLONOSCOPY    . ESOPHAGOGASTRODUODENOSCOPY    . EYE SURGERY    . REPLACEMENT TOTAL KNEE Left   . TOE SURGERY Left    Left great toe  . toenail removal Right    all 5 toenails  . TOTAL HIP ARTHROPLASTY    . TOTAL KNEE ARTHROPLASTY Right 12/09/2013   Procedure: Right Total Knee Arthroplasty;  Surgeon: Newt Minion, MD;  Location: [facility];  Service: Orthopedics;  Laterality: Right;  Marland Kitchen VESICOVAGINAL FISTULA CLOSURE W/ TAH     Social History   Occupational History  . Not on file  Tobacco Use  . Smoking status: Former Smoker    Packs/day: 0.30    Years: 10.00    Pack years: 3.00    Types: Cigarettes    Quit date: 02/05/1978    Years since quitting: 41.7  . Smokeless tobacco: Never Used  . Tobacco comment: over 25 years ago...pt doesnt remember when Taylor Mccall quit.   Vaping Use  . Vaping Use: Never used  Substance and Sexual Activity  . Alcohol use: No  . Drug use: No  . Sexual activity: Not on file

## 2019-10-21 NOTE — Patient Instructions (Addendum)
Avoid overhead lifting and overhead use of the arms. Do not lift greater than 5 lbs. Adjust head rest in vehicle to prevent hyperextension if rear ended. Take extra precautions to avoid falling. Start gabapentin 100mg  po qhs. MRI of the cervical spine to assess for cervical stenosis and cord compression due to recent falls, worseing  Upper extremity weakness and loss of balance and coordination. Baclofen 10 mg po BID for muscle spasm in the legs.

## 2019-11-02 DIAGNOSIS — G4733 Obstructive sleep apnea (adult) (pediatric): Secondary | ICD-10-CM | POA: Diagnosis not present

## 2019-11-12 ENCOUNTER — Ambulatory Visit
Admission: RE | Admit: 2019-11-12 | Discharge: 2019-11-12 | Disposition: A | Discharge status: Home / Self Care | Payer: Medicare HMO | Source: Ambulatory Visit | Attending: Specialist | Admitting: Specialist

## 2019-11-12 ENCOUNTER — Ambulatory Visit: Payer: Medicare HMO | Admitting: Specialist

## 2019-11-12 DIAGNOSIS — M4802 Spinal stenosis, cervical region: Secondary | ICD-10-CM | POA: Diagnosis not present

## 2019-11-12 DIAGNOSIS — G1220 Motor neuron disease, unspecified: Secondary | ICD-10-CM

## 2019-11-13 DIAGNOSIS — G4733 Obstructive sleep apnea (adult) (pediatric): Secondary | ICD-10-CM | POA: Diagnosis not present

## 2019-11-26 ENCOUNTER — Other Ambulatory Visit: Payer: Self-pay

## 2019-11-26 ENCOUNTER — Encounter: Payer: Self-pay | Admitting: Specialist

## 2019-11-26 ENCOUNTER — Ambulatory Visit (INDEPENDENT_AMBULATORY_CARE_PROVIDER_SITE_OTHER): Payer: Medicare HMO | Admitting: Specialist

## 2019-11-26 VITALS — BP 146/83 | HR 97 | Ht 70.0 in | Wt 285.0 lb

## 2019-11-26 DIAGNOSIS — Z96653 Presence of artificial knee joint, bilateral: Secondary | ICD-10-CM | POA: Diagnosis not present

## 2019-11-26 DIAGNOSIS — R2689 Other abnormalities of gait and mobility: Secondary | ICD-10-CM

## 2019-11-26 DIAGNOSIS — R200 Anesthesia of skin: Secondary | ICD-10-CM | POA: Diagnosis not present

## 2019-11-26 DIAGNOSIS — M4802 Spinal stenosis, cervical region: Secondary | ICD-10-CM | POA: Diagnosis not present

## 2019-11-26 DIAGNOSIS — M542 Cervicalgia: Secondary | ICD-10-CM

## 2019-11-26 DIAGNOSIS — W19XXXD Unspecified fall, subsequent encounter: Secondary | ICD-10-CM | POA: Diagnosis not present

## 2019-11-26 DIAGNOSIS — M4712 Other spondylosis with myelopathy, cervical region: Secondary | ICD-10-CM | POA: Diagnosis not present

## 2019-11-26 NOTE — Patient Instructions (Signed)
Plan: Avoid overhead lifting and overhead use of the arms. Do not lift greater than 5-10 lbs. Adjust head rest in vehicle to prevent hyperextension if rear ended. Take extra precautions to avoid falling. Fall Prevention and Home Safety Falls cause injuries and can affect all age groups. It is possible to use preventive measures to significantly decrease the likelihood of falls. There are many simple measures which can make your home safer and prevent falls. OUTDOORS  Repair cracks and edges of walkways and driveways.  Remove high doorway thresholds.  Trim shrubbery on the main path into your home.  Have good outside lighting.  Clear walkways of tools, rocks, debris, and clutter.  Check that handrails are not broken and are securely fastened. Both sides of steps should have handrails.  Have leaves, snow, and ice cleared regularly.  Use sand or salt on walkways during winter months.  In the garage, clean up grease or oil spills. BATHROOM  Install night lights.  Install grab bars by the toilet and in the tub and shower.  Use non-skid mats or decals in the tub or shower.  Place a plastic non-slip stool in the shower to sit on, if needed.  Keep floors dry and clean up all water on the floor immediately.  Remove soap buildup in the tub or shower on a regular basis.  Secure bath mats with non-slip, double-sided rug tape.  Remove throw rugs and tripping hazards from the floors. BEDROOMS  Install night lights.  Make sure a bedside light is easy to reach.  Do not use oversized bedding.  Keep a telephone by your bedside.  Have a firm chair with side arms to use for getting dressed.  Remove throw rugs and tripping hazards from the floor. KITCHEN  Keep handles on pots and pans turned toward the center of the stove. Use back burners when possible.  Clean up spills quickly and allow time for drying.  Avoid walking on wet floors.  Avoid hot utensils and  knives.  Position shelves so they are not too high or low.  Place commonly used objects within easy reach.  If necessary, use a sturdy step stool with a grab bar when reaching.  Keep electrical cables out of the way.  Do not use floor polish or wax that makes floors slippery. If you must use wax, use non-skid floor wax.  Remove throw rugs and tripping hazards from the floor. STAIRWAYS  Never leave objects on stairs.  Place handrails on both sides of stairways and use them. Fix any loose handrails. Make sure handrails on both sides of the stairways are as long as the stairs.  Check carpeting to make sure it is firmly attached along stairs. Make repairs to worn or loose carpet promptly.  Avoid placing throw rugs at the top or bottom of stairways, or properly secure the rug with carpet tape to prevent slippage. Get rid of throw rugs, if possible.  Have an electrician put in a light switch at the top and bottom of the stairs. OTHER FALL PREVENTION TIPS  Wear low-heel or rubber-soled shoes that are supportive and fit well. Wear closed toe shoes.  When using a stepladder, make sure it is fully opened and both spreaders are firmly locked. Do not climb a closed stepladder.  Add color or contrast paint or tape to grab bars and handrails in your home. Place contrasting color strips on first and last steps.  Learn and use mobility aids as needed. Install an electrical emergency response  system.  Turn on lights to avoid dark areas. Replace light bulbs that burn out immediately. Get light switches that glow.  Arrange furniture to create clear pathways. Keep furniture in the same place.  Firmly attach carpet with non-skid or double-sided tape.  Eliminate uneven floor surfaces.  Select a carpet pattern that does not visually hide the edge of steps.  Be aware of all pets. OTHER HOME SAFETY TIPS  Set the water temperature for 120 F (48.8 C).  Keep emergency numbers on or near the  telephone.  Keep smoke detectors on every level of the home and near sleeping areas. Document Released: 01/12/2002 Document Revised: 07/24/2011 Document Reviewed: 04/13/2011 [facility] Patient Information 2014 [place]. Obtain EMG/NCV of the arms to assess for cervical radiculopathy vs peripheral neuropathy, MRI with multiple level Moderate cervical stenosis, no clinical long tract findings.

## 2019-11-26 NOTE — Progress Notes (Signed)
Office Visit Note   Patient: Taylor Mccall           Date of Birth: 1945-09-03           MRN: [id number] Visit Date: 11/26/2019              Requested by: Dorothyann Peng, NP [address] [place],  [place] [address] PCP: Dorothyann Peng, NP   Assessment & Plan: Visit Diagnoses:  1. Cervicalgia   2. Spinal stenosis of cervical region   3. H/O total knee replacement, bilateral   4. Balance disorder   5. Falls, subsequent encounter   6. Other spondylosis with myelopathy, cervical region     Plan: Avoid overhead lifting and overhead use of the arms. Do not lift greater than 5-10 lbs. Adjust head rest in vehicle to prevent hyperextension if rear ended. Take extra precautions to avoid falling. Fall Prevention and Home Safety Falls cause injuries and can affect all age groups. It is possible to use preventive measures to significantly decrease the likelihood of falls. There are many simple measures which can make your home safer and prevent falls. OUTDOORS  Repair cracks and edges of walkways and driveways.  Remove high doorway thresholds.  Trim shrubbery on the main path into your home.  Have good outside lighting.  Clear walkways of tools, rocks, debris, and clutter.  Check that handrails are not broken and are securely fastened. Both sides of steps should have handrails.  Have leaves, snow, and ice cleared regularly.  Use sand or salt on walkways during winter months.  In the garage, clean up grease or oil spills. BATHROOM  Install night lights.  Install grab bars by the toilet and in the tub and shower.  Use non-skid mats or decals in the tub or shower.  Place a plastic non-slip stool in the shower to sit on, if needed.  Keep floors dry and clean up all water on the floor immediately.  Remove soap buildup in the tub or shower on a regular basis.  Secure bath mats with non-slip, double-sided rug tape.  Remove throw rugs and tripping hazards from  the floors. BEDROOMS  Install night lights.  Make sure a bedside light is easy to reach.  Do not use oversized bedding.  Keep a telephone by your bedside.  Have a firm chair with side arms to use for getting dressed.  Remove throw rugs and tripping hazards from the floor. KITCHEN  Keep handles on pots and pans turned toward the center of the stove. Use back burners when possible.  Clean up spills quickly and allow time for drying.  Avoid walking on wet floors.  Avoid hot utensils and knives.  Position shelves so they are not too high or low.  Place commonly used objects within easy reach.  If necessary, use a sturdy step stool with a grab bar when reaching.  Keep electrical cables out of the way.  Do not use floor polish or wax that makes floors slippery. If you must use wax, use non-skid floor wax.  Remove throw rugs and tripping hazards from the floor. STAIRWAYS  Never leave objects on stairs.  Place handrails on both sides of stairways and use them. Fix any loose handrails. Make sure handrails on both sides of the stairways are as long as the stairs.  Check carpeting to make sure it is firmly attached along stairs. Make repairs to worn or loose carpet promptly.  Avoid placing throw rugs at the top or  bottom of stairways, or properly secure the rug with carpet tape to prevent slippage. Get rid of throw rugs, if possible.  Have an electrician put in a light switch at the top and bottom of the stairs. OTHER FALL PREVENTION TIPS  Wear low-heel or rubber-soled shoes that are supportive and fit well. Wear closed toe shoes.  When using a stepladder, make sure it is fully opened and both spreaders are firmly locked. Do not climb a closed stepladder.  Add color or contrast paint or tape to grab bars and handrails in your home. Place contrasting color strips on first and last steps.  Learn and use mobility aids as needed. Install an electrical emergency response  system.  Turn on lights to avoid dark areas. Replace light bulbs that burn out immediately. Get light switches that glow.  Arrange furniture to create clear pathways. Keep furniture in the same place.  Firmly attach carpet with non-skid or double-sided tape.  Eliminate uneven floor surfaces.  Select a carpet pattern that does not visually hide the edge of steps.  Be aware of all pets. OTHER HOME SAFETY TIPS  Set the water temperature for 120 F (48.8 C).  Keep emergency numbers on or near the telephone.  Keep smoke detectors on every level of the home and near sleeping areas. Document Released: 01/12/2002 Document Revised: 07/24/2011 Document Reviewed: 04/13/2011 [facility] Patient Information 2014 [place]. Obtain EMG/NCV of the arms to assess for cervical radiculopathy vs peripheral neuropathy, MRI with multiple level Moderate cervical stenosis, no clinical long tract findings.   Follow-Up Instructions: No follow-ups on file.   Orders:  No orders of the defined types were placed in this encounter.  No orders of the defined types were placed in this encounter.     Procedures: No procedures performed   Clinical Data: No additional findings.   Subjective: Chief Complaint  Patient presents with  . Neck - Pain    74 year old female with neck pain and radiates into boths arms and hands. Was thinking hand pain was due to arthritis, uses gloves to  Keep them warm and and to prevent the fingers from locking up on her. Pain is a "12" on a scale of 1-10. She hurts all over.    Review of Systems  Constitutional: Negative.  Negative for activity change, appetite change, chills, diaphoresis, fatigue, fever and unexpected weight change.  HENT: Positive for hearing loss and sneezing. Negative for congestion, dental problem, drooling, ear discharge, ear pain (right ear bothers her all the time, feels like something is in it. ), facial swelling, mouth sores, nosebleeds,  postnasal drip, rhinorrhea, sinus pressure, sinus pain, sore throat, tinnitus, trouble swallowing and voice change.   Eyes: Negative.  Negative for photophobia, pain, discharge, redness, itching and visual disturbance.  Respiratory: Positive for cough. Negative for apnea, choking, chest tightness, shortness of breath, wheezing and stridor.   Cardiovascular: Negative.  Negative for chest pain, palpitations and leg swelling.  Gastrointestinal: Negative.  Negative for abdominal distention, abdominal pain, anal bleeding, blood in stool, constipation, diarrhea, nausea and rectal pain.  Endocrine: Negative.  Negative for cold intolerance, heat intolerance, polydipsia, polyphagia and polyuria.  Genitourinary: Positive for frequency and urgency. Negative for difficulty urinating, dyspareunia, dysuria, enuresis and flank pain.  Musculoskeletal: Positive for neck pain and neck stiffness. Negative for arthralgias, back pain, gait problem, joint swelling and myalgias.  Skin: Negative.  Negative for color change, pallor, rash and wound.  Allergic/Immunologic: Positive for environmental allergies. Negative for food  allergies and immunocompromised state.  Neurological: Positive for tremors, syncope, weakness, light-headedness and numbness. Negative for dizziness, seizures, facial asymmetry, speech difficulty and headaches.  Hematological: Negative for adenopathy. Does not bruise/bleed easily.  Psychiatric/Behavioral: Negative.  Negative for agitation, behavioral problems, confusion, decreased concentration, dysphoric mood, hallucinations, self-injury, sleep disturbance and suicidal ideas. The patient is not nervous/anxious and is not hyperactive.      Objective: Vital Signs: BP (!) 146/83   Pulse 97   Ht 5\' 10"  (1.778 m)   Wt 285 lb (129.3 kg)   BMI 40.89 kg/m   Physical Exam Constitutional:      Appearance: She is well-developed.  HENT:     Head: Normocephalic and atraumatic.  Eyes:     Pupils:  Pupils are equal, round, and reactive to light.  Pulmonary:     Effort: Pulmonary effort is normal.     Breath sounds: Normal breath sounds.  Abdominal:     General: Bowel sounds are normal.     Palpations: Abdomen is soft.  Musculoskeletal:     Cervical back: Normal range of motion and neck supple.     Lumbar back: Negative right straight leg raise test and negative left straight leg raise test.     Right lower leg: Edema present.     Left lower leg: Edema present.  Skin:    General: Skin is warm and dry.  Neurological:     Mental Status: She is alert and oriented to person, place, and time.  Psychiatric:        Behavior: Behavior normal.        Thought Content: Thought content normal.        Judgment: Judgment normal.     Back Exam   Tenderness  The patient is experiencing tenderness in the cervical.  Range of Motion  Extension: abnormal  Flexion: abnormal  Lateral bend right: normal  Lateral bend left: normal  Rotation right: normal  Rotation left: normal   Muscle Strength  Right Quadriceps:  5/5  Left Quadriceps:  5/5  Right Hamstrings:  5/5  Left Hamstrings:  5/5   Tests  Straight leg raise right: negative Straight leg raise left: negative  Reflexes  Patellar: 2/4 Achilles: 2/4 Biceps: 0/4 Babinski's sign: normal   Other  Toe walk: normal Heel walk: normal Sensation: normal  Comments:  Bilateral hand deformity due to RA      Specialty Comments:  No specialty comments available.  Imaging: No results found.   PMFS History: Patient Active Problem List   Diagnosis Date Noted  . Routine general medical examination at a health care facility 12/11/2016  . Primary osteoarthritis of left hip 05/01/2016  . Autoimmune disease ([place]) 11/29/2015  . High risk medication use 11/29/2015  . Vitamin D deficiency 11/29/2015  . Macular degeneration 11/29/2015  . Bilateral lower extremity edema 10/26/2015  . H/O total knee replacement, bilateral  12/09/2013  . Breast mass, left 01/18/2011  . Obesity 12/08/2009  . Venous (peripheral) insufficiency 07/28/2009  . PAIN IN JOINT, ANKLE AND FOOT 01/12/2008  . Obstructive sleep apnea 08/29/2007  . GAIT DISTURBANCE 08/14/2007  . Depression 08/26/2006  . Essential hypertension 08/26/2006  . Primary osteoarthritis of both hands 08/26/2006  . Urinary incontinence 08/26/2006   Past Medical History:  Diagnosis Date  . Anxiety   . Blood transfusion without reported diagnosis   . Breast mass, left   . Cataract   . Depression   . Gait disturbance   . GERD (gastroesophageal reflux  disease)   . Hypertension   . Lymphedema of both lower extremities    Seeing OT at [place] to Left Lower leg  . Obesity   . Osteoarthritis   . Rheumatoid arthritis(714.0)   . Sleep apnea    cpap  . Urinary incontinence   . Venous insufficiency     Family History  Problem Relation Age of Onset  . Diabetes Other   . Stroke Other   . Asthma Brother   . Heart disease Brother   . Coronary artery disease Father   . Skin cancer Father   . Heart disease Father   . Coronary artery disease Brother   . Colon cancer Neg Hx   . Esophageal cancer Neg Hx   . Rectal cancer Neg Hx   . Stomach cancer Neg Hx     Past Surgical History:  Procedure Laterality Date  . ABDOMINAL HYSTERECTOMY    . BLADDER SURGERY     sling  . BLADDER SUSPENSION  2009  . BREAST REDUCTION SURGERY    . COLONOSCOPY    . ESOPHAGOGASTRODUODENOSCOPY    . EYE SURGERY    . REPLACEMENT TOTAL KNEE Left   . TOE SURGERY Left    Left great toe  . toenail removal Right    all 5 toenails  . TOTAL HIP ARTHROPLASTY    . TOTAL KNEE ARTHROPLASTY Right 12/09/2013   Procedure: Right Total Knee Arthroplasty;  Surgeon: Newt Minion, MD;  Location: [place];  Service: Orthopedics;  Laterality: Right;  Marland Kitchen VESICOVAGINAL FISTULA CLOSURE W/ TAH     Social History   Occupational History  . Not on file  Tobacco Use  .  Smoking status: Former Smoker    Packs/day: 0.30    Years: 10.00    Pack years: 3.00    Types: Cigarettes    Quit date: 02/05/1978    Years since quitting: 41.8  . Smokeless tobacco: Never Used  . Tobacco comment: over 25 years ago...pt doesnt remember when she quit.   Vaping Use  . Vaping Use: Never used  Substance and Sexual Activity  . Alcohol use: No  . Drug use: No  . Sexual activity: Not on file

## 2019-11-27 ENCOUNTER — Other Ambulatory Visit: Payer: Self-pay | Admitting: Specialist

## 2019-12-02 ENCOUNTER — Other Ambulatory Visit: Payer: Self-pay

## 2019-12-02 ENCOUNTER — Ambulatory Visit: Payer: Medicare HMO | Attending: Specialist

## 2019-12-02 DIAGNOSIS — R2689 Other abnormalities of gait and mobility: Secondary | ICD-10-CM | POA: Diagnosis not present

## 2019-12-02 DIAGNOSIS — M542 Cervicalgia: Secondary | ICD-10-CM | POA: Insufficient documentation

## 2019-12-02 DIAGNOSIS — R29898 Other symptoms and signs involving the musculoskeletal system: Secondary | ICD-10-CM | POA: Diagnosis not present

## 2019-12-02 DIAGNOSIS — G8929 Other chronic pain: Secondary | ICD-10-CM | POA: Insufficient documentation

## 2019-12-02 DIAGNOSIS — M6281 Muscle weakness (generalized): Secondary | ICD-10-CM | POA: Diagnosis not present

## 2019-12-02 DIAGNOSIS — R262 Difficulty in walking, not elsewhere classified: Secondary | ICD-10-CM

## 2019-12-02 DIAGNOSIS — G4733 Obstructive sleep apnea (adult) (pediatric): Secondary | ICD-10-CM | POA: Diagnosis not present

## 2019-12-03 NOTE — Therapy (Signed)
[place], [place], [address] Phone: [phone]   Fax:  [phone]  Physical Therapy Evaluation  Patient Details  Name: Taylor Mccall MRN: [id number] Date of Birth: 13-Aug-1945 Referring Provider (PT): Jessy Oto, MD   Encounter Date: 12/02/2019   PT End of Session - 12/03/19 2231    Visit Number 1    Number of Visits 13    Date for PT Re-Evaluation 01/23/20    Authorization Type [employer] MEDICARE HMO/PPO    Authorization Time Period FOTO on 6th and 11th visits    Progress Note Due on Visit 10    PT Start Time 1221    PT Stop Time 1318    PT Time Calculation (min) 57 min    Equipment Utilized During Treatment --   4wrw   Activity Tolerance Patient tolerated treatment well    Behavior During Therapy [facility] for tasks assessed/performed           Past Medical History:  Diagnosis Date  . Anxiety   . Blood transfusion without reported diagnosis   . Breast mass, left   . Cataract   . Depression   . Gait disturbance   . GERD (gastroesophageal reflux disease)   . Hypertension   . Lymphedema of both lower extremities    Seeing OT at [place] to Left Lower leg  . Obesity   . Osteoarthritis   . Rheumatoid arthritis(714.0)   . Sleep apnea    cpap  . Urinary incontinence   . Venous insufficiency     Past Surgical History:  Procedure Laterality Date  . ABDOMINAL HYSTERECTOMY    . BLADDER SURGERY     sling  . BLADDER SUSPENSION  2009  . BREAST REDUCTION SURGERY    . COLONOSCOPY    . ESOPHAGOGASTRODUODENOSCOPY    . EYE SURGERY    . REPLACEMENT TOTAL KNEE Left   . TOE SURGERY Left    Left great toe  . toenail removal Right    all 5 toenails  . TOTAL HIP ARTHROPLASTY    . TOTAL KNEE ARTHROPLASTY Right 12/09/2013   Procedure: Right Total Knee Arthroplasty;  Surgeon: Newt Minion, MD;  Location: [place];  Service: Orthopedics;  Laterality: Right;  Marland Kitchen  VESICOVAGINAL FISTULA CLOSURE W/ TAH      There were no vitals filed for this visit.    Subjective Assessment - 12/03/19 2222    Subjective Pt reports wrosening neck pain. Pt states she has been using a RW for 3 years    Diagnostic tests 11/12/19: IMPRESSION:1. Multilevel degenerative changes of the cervical spine asdescribed above, with moderate spinal canal stenosis at C3-4 andC5-6.2. Severe bilateral neural foraminal narrowing at C3-4.3. Moderate left neural foraminal narrowing at C7-T1.4. Cystic appearing structure in the left petrous apex, onlypartially evaluated. Correlation brain MRI suggested.    Patient Stated Goals For my neck to hurt less and to walk with better balance    Currently in Pain? Yes    Pain Score 5     Pain Location Neck    Pain Orientation Right;Posterior;Lower    Pain Descriptors / Indicators Aching    Pain Type Chronic pain    Pain Onset More than a month ago    Pain Frequency Constant              [facility] PT Assessment - 12/03/19 0001      Assessment   Medical Diagnosis Spinal stenosis of  cervical region;: Balance disorder; fFalls subsequent encounter    Referring Provider (PT) Jessy Oto, MD    Hand Dominance Right    Next MD Visit EMG on 11/30    Prior Therapy no      Precautions   Precautions None      Restrictions   Weight Bearing Restrictions No      Balance Screen   Has the patient fallen in the past 6 months No      [place] residence    Living Arrangements Children    Type of [place] to enter    Entrance Stairs-Number of Steps 2    Entrance Stairs-Rails Right    Home Layout Two level    Alternate Level Stairs-Number of Steps 14    Alternate Level Stairs-Rails Right;Left;Can reach both   use chair lift for stairs   [place] - 4 wheels      Prior Function   Level of Independence Independent with household mobility with device    Vocation Retired       Associate Professor   Overall Cognitive Status Within Functional Limits for tasks assessed      Observation/Other Assessments   Focus on Therapeutic Outcomes (FOTO)  32% functional ability      Sensation   Light Touch Appears Intact      ROM / Strength   AROM / PROM / Strength AROM;Strength      AROM   Overall AROM Comments Pt reports R lower cervical pain with all cervical movements    AROM Assessment Site Cervical    Cervical Flexion 40    Cervical Extension 30    Cervical - Right Side Bend 20    Cervical - Left Side Bend 14    Cervical - Right Rotation 30    Cervical - Left Rotation 30      Strength   Overall Strength Comments Myotonal screen for UEs is intact. Grp strength: L 40#, R 40#      Palpation   Palpation comment TTP to cervical paraspinals and upper trap      Transfers   Transfers Sit to Stand;Stand to Sit    Sit to Stand 6: Modified independent (Device/Increase time)   labored STS     Ambulation/Gait   Gait Pattern Step-to pattern   decreased pace                     Objective measurements completed on examination: See above findings.               PT Education - 12/03/19 2230    Education Details Eval findings, POC, HEP, sleeping positions for comfort    Person(s) Educated Patient    Methods Explanation;Demonstration;Tactile cues;Verbal cues;Handout    Comprehension Verbalized understanding;Returned demonstration;Verbal cues required;Tactile cues required;Need further instruction            PT Short Term Goals - 12/03/19 2304      PT SHORT TERM GOAL #1   Title Pt will be Ind c an initial HEP    Period Weeks    Status New    Target Date 12/24/19      PT SHORT TERM GOAL #2   Title Pt will voice undestanding of measures to reduce and manage neck pain.    Status New    Target Date 12/24/19  PT Long Term Goals - 12/03/19 2305      PT LONG TERM GOAL #1   Title "Pt will be independent with advanced HEP.     Status  New    Target Date 01/23/20      PT LONG TERM GOAL #2   Title Pt will report improved neck pain of 3 or less with daily activities    Status New    Target Date 01/23/20      PT LONG TERM GOAL #3   Title Cervical ROMs will increase by at least 5d for each motion for improved balance, field of vision, and safety    Baseline see flowsheets    Status New    Target Date 01/23/20      PT LONG TERM GOAL #4   Title Improve FOTO functional ability score to 41%    Baseline 32% functional ability                  Plan - 12/03/19 2235    Clinical Impression Statement Pt presents with markedly decreased cervical ROM c lower R neck pain. Pt has decreased functional mobility needing a 4wrw for amb (has used for 3 years). Pt walks with decreased step length and pace. STS is labored. Cervical retraction was completed in supine and sitting with pt reporting a reduction in her neck pain. Pt will benefit from PT to address pain, cervical ROM, strength, balance, and activity tolerance to improve her functional mobility and safety.    Personal Factors and Comorbidities Age;Comorbidity 3+    Comorbidities OA, RA, obesity, depression, anxiety, HTN    Examination-Activity Limitations Locomotion Level;Transfers;Reach Overhead;Caring for Others;Carry;Dressing;Stand;Stairs;Squat;Sleep    Stability/Clinical Decision Making Evolving/Moderate complexity    Clinical Decision Making Moderate    Rehab Potential Good    PT Frequency 2x / week    PT Duration 6 weeks    PT Treatment/Interventions ADLs/Self Care Home Management;Cryotherapy;Electrical Stimulation;Ultrasound;Moist Heat;Iontophoresis 4mg /ml Dexamethasone;Gait training;Stair training;Functional mobility training;Therapeutic activities;Therapeutic exercise;Balance training;Manual techniques;Patient/family education;Passive range of motion;Dry needling;Energy conservation;Taping;Vasopneumatic Device    PT Next Visit Plan Assess response to HEP. Assess LE  strength, TUG, [place], 2MWT over the next 2 visits and set goals. Review FOTO    PT Home Exercise Plan [id number]    Consulted and Agree with Plan of Care Patient           Patient will benefit from skilled therapeutic intervention in order to improve the following deficits and impairments:  Abnormal gait, Decreased range of motion, Difficulty walking, Decreased endurance, Obesity, Decreased activity tolerance, Pain, Decreased balance, Decreased mobility, Decreased strength, Postural dysfunction  Visit Diagnosis: Muscle weakness (generalized)  Difficulty in walking, not elsewhere classified  Other abnormalities of gait and mobility  Decreased ROM of neck  Chronic cervical pain     Problem List Patient Active Problem List   Diagnosis Date Noted  . Routine general medical examination at a health care facility 12/11/2016  . Primary osteoarthritis of left hip 05/01/2016  . Autoimmune disease ([place]) 11/29/2015  . High risk medication use 11/29/2015  . Vitamin D deficiency 11/29/2015  . Macular degeneration 11/29/2015  . Bilateral lower extremity edema 10/26/2015  . H/O total knee replacement, bilateral 12/09/2013  . Breast mass, left 01/18/2011  . Obesity 12/08/2009  . Venous (peripheral) insufficiency 07/28/2009  . PAIN IN JOINT, ANKLE AND FOOT 01/12/2008  . Obstructive sleep apnea 08/29/2007  . GAIT DISTURBANCE 08/14/2007  . Depression 08/26/2006  . Essential hypertension 08/26/2006  . Primary osteoarthritis of  both hands 08/26/2006  . Urinary incontinence 08/26/2006    Gar Ponto MS, PT 12/03/19 11:17 PM  [facility] [address] [place], [place], [address] Phone: [phone]   Fax:  [phone]  Name: ZALEAH TERNES MRN: [id number] Date of Birth: 1945/07/21

## 2019-12-14 ENCOUNTER — Ambulatory Visit: Payer: Medicare HMO | Admitting: Physical Therapy

## 2019-12-14 DIAGNOSIS — G4733 Obstructive sleep apnea (adult) (pediatric): Secondary | ICD-10-CM | POA: Diagnosis not present

## 2019-12-16 ENCOUNTER — Other Ambulatory Visit: Payer: Self-pay

## 2019-12-16 ENCOUNTER — Ambulatory Visit: Payer: Medicare HMO | Attending: Specialist

## 2019-12-16 DIAGNOSIS — M542 Cervicalgia: Secondary | ICD-10-CM | POA: Insufficient documentation

## 2019-12-16 DIAGNOSIS — R2681 Unsteadiness on feet: Secondary | ICD-10-CM

## 2019-12-16 DIAGNOSIS — G8929 Other chronic pain: Secondary | ICD-10-CM | POA: Diagnosis not present

## 2019-12-16 DIAGNOSIS — R262 Difficulty in walking, not elsewhere classified: Secondary | ICD-10-CM

## 2019-12-16 DIAGNOSIS — M6281 Muscle weakness (generalized): Secondary | ICD-10-CM

## 2019-12-16 DIAGNOSIS — R2689 Other abnormalities of gait and mobility: Secondary | ICD-10-CM | POA: Diagnosis not present

## 2019-12-16 DIAGNOSIS — R29898 Other symptoms and signs involving the musculoskeletal system: Secondary | ICD-10-CM

## 2019-12-17 NOTE — Therapy (Signed)
[place], [place], [address] Phone: [phone]   Fax:  [phone]  Physical Therapy Treatment  Patient Details  Name: Taylor Mccall MRN: [id number] Date of Birth: 02-Jun-1945 Referring Provider (PT): Jessy Oto, MD   Encounter Date: 12/16/2019   PT End of Session - 12/17/19 0654    Visit Number 2    Number of Visits 13    Date for PT Re-Evaluation 01/23/20    Authorization Type [employer] MEDICARE HMO/PPO    Authorization Time Period FOTO on 6th and 11th visits    Progress Note Due on Visit 10    PT Start Time 1226    PT Stop Time 1313    PT Time Calculation (min) 47 min    Equipment Utilized During Treatment Other (comment)   4wrw   Activity Tolerance Patient tolerated treatment well    Behavior During Therapy [facility] for tasks assessed/performed           Past Medical History:  Diagnosis Date  . Anxiety   . Blood transfusion without reported diagnosis   . Breast mass, left   . Cataract   . Depression   . Gait disturbance   . GERD (gastroesophageal reflux disease)   . Hypertension   . Lymphedema of both lower extremities    Seeing OT at [place] to Left Lower leg  . Obesity   . Osteoarthritis   . Rheumatoid arthritis(714.0)   . Sleep apnea    cpap  . Urinary incontinence   . Venous insufficiency     Past Surgical History:  Procedure Laterality Date  . ABDOMINAL HYSTERECTOMY    . BLADDER SURGERY     sling  . BLADDER SUSPENSION  2009  . BREAST REDUCTION SURGERY    . COLONOSCOPY    . ESOPHAGOGASTRODUODENOSCOPY    . EYE SURGERY    . REPLACEMENT TOTAL KNEE Left   . TOE SURGERY Left    Left great toe  . toenail removal Right    all 5 toenails  . TOTAL HIP ARTHROPLASTY    . TOTAL KNEE ARTHROPLASTY Right 12/09/2013   Procedure: Right Total Knee Arthroplasty;  Surgeon: Newt Minion, MD;  Location: [facility];  Service: Orthopedics;  Laterality: Right;  Marland Kitchen  VESICOVAGINAL FISTULA CLOSURE W/ TAH      There were no vitals filed for this visit.   Subjective Assessment - 12/16/19 1235    Subjective Pt reports her neck is doing good and feeling much better, although she is still having difficulty sleeping.    Diagnostic tests 11/12/19: IMPRESSION:1. Multilevel degenerative changes of the cervical spine asdescribed above, with moderate spinal canal stenosis at C3-4 andC5-6.2. Severe bilateral neural foraminal narrowing at C3-4.3. Moderate left neural foraminal narrowing at C7-T1.4. Cystic appearing structure in the left petrous apex, onlypartially evaluated. Correlation brain MRI suggested.    Patient Stated Goals For my neck to hurt less and to walk with better balance    Currently in Pain? Yes    Pain Score 2     Pain Location Neck    Pain Orientation Posterior;Right;Lower    Pain Descriptors / Indicators Aching    Pain Type Chronic pain    Pain Onset More than a month ago    Pain Frequency Constant              [facility] PT Assessment - 12/17/19 0001      Berg Balance Test   Sit  to Stand Able to stand using hands after several tries    Standing Unsupported Able to stand 2 minutes with supervision    Sitting with Back Unsupported but Feet Supported on Floor or Stool Able to sit safely and securely 2 minutes    Stand to Sit Uses backs of legs against chair to control descent    Transfers Able to transfer safely, definite need of hands    Standing Unsupported with Eyes Closed Able to stand 10 seconds with supervision    Standing Unsupported with Feet Together Able to place feet together independently but unable to hold for 30 seconds    From Standing, Reach Forward with Outstretched Arm Can reach forward >12 cm safely (5")    From Standing Position, Pick up Object from Floor Unable to pick up shoe, but reaches 2-5 cm (1-2") from shoe and balances independently    From Standing Position, Turn to Look Behind Over each Shoulder Turn sideways only  but maintains balance    Turn 360 Degrees Needs close supervision or verbal cueing    Standing Unsupported, Alternately Place Feet on Step/Stool Needs assistance to keep from falling or unable to try    Standing Unsupported, One Foot in [place] to take small step independently and hold 30 seconds    Standing on One Leg Tries to lift leg/unable to hold 3 seconds but remains standing independently    Total Score 30                         [facility] Adult PT Treatment/Exercise - 12/17/19 0001      Exercises   Exercises Knee/Hip;Neck      Neck Exercises: Seated   Neck Retraction 10 reps    Neck Retraction Limitations 3 sec    Cervical Rotation Right;Left;5 reps    Cervical Rotation Limitations 5 secs    Lateral Flexion Right;Left;5 reps    Lateral Flexion Limitations 5 sec      Knee/Hip Exercises: Seated   Long Arc Quad Right;Left;10 reps    Long Arc Quad Limitations 3 sec    Ball Squeeze 10x; 5 sec    Clamshell with TheraBand Red   10x, each LE   Knee/Hip Flexion R and L 10x, RTB    Other Seated Knee/Hip Exercises Ankle rocks, 10x    Sit to [employer] 10 reps;without UE support                  PT Education - 12/17/19 0653    Education Details HEP for additional cervical ROM exs and LR strengthening exs. Pt obtained a theracane for trigger point massage and PT instructed in it's use.    Person(s) Educated Patient    Methods Explanation;Demonstration;Tactile cues;Verbal cues;Handout    Comprehension Verbalized understanding;Returned demonstration;Verbal cues required;Tactile cues required;Need further instruction            PT Short Term Goals - 12/03/19 2304      PT SHORT TERM GOAL #1   Title Pt will be Ind c an initial HEP    Period Weeks    Status New    Target Date 12/24/19      PT SHORT TERM GOAL #2   Title Pt will voice undestanding of measures to reduce and manage neck pain.    Status New    Target Date 12/24/19             PT Long Term  Goals - 12/17/19 [phone]  PT LONG TERM GOAL #4   Target Date 01/23/20      PT LONG TERM GOAL #5   Title Increase pt's Berg score to 36/56 as an indication of improved strength, balance, safety and functional mobility.    Baseline 30/56    Status New    Target Date 12/24/19                 Plan - 12/17/19 0655    Clinical Impression Statement Pt responded well to the inital PT session with decreased neck pain. Additional, neck ROM exs were added, as well as LE strengthening exs to improve balance, safety, and functional mobility. Pt tolerated the PT session c no adverse effects.Merrilee Jansky was assess and pt scored 30/56 indicating poor balance and need for an AD which the pt is using.    Personal Factors and Comorbidities Age;Comorbidity 3+    Comorbidities OA, RA, obesity, depression, anxiety, HTN    Examination-Activity Limitations Locomotion Level;Transfers;Reach Overhead;Caring for Others;Carry;Dressing;Stand;Stairs;Squat;Sleep    Stability/Clinical Decision Making Evolving/Moderate complexity    Clinical Decision Making Moderate    Rehab Potential Good    PT Frequency 2x / week    PT Duration 6 weeks    PT Treatment/Interventions ADLs/Self Care Home Management;Cryotherapy;Electrical Stimulation;Ultrasound;Moist Heat;Iontophoresis 4mg /ml Dexamethasone;Gait training;Stair training;Functional mobility training;Therapeutic activities;Therapeutic exercise;Balance training;Manual techniques;Patient/family education;Passive range of motion;Dry needling;Energy conservation;Taping;Vasopneumatic Device    PT Next Visit Plan Assess response to HEP. TUG, 2MWT over the next 2 visits and set goals. Review FOTO    PT Home Exercise Plan [id number]    Consulted and Agree with Plan of Care Patient           Patient will benefit from skilled therapeutic intervention in order to improve the following deficits and impairments:     Visit Diagnosis: Muscle weakness (generalized)  Difficulty in  walking, not elsewhere classified  Other abnormalities of gait and mobility  Decreased ROM of neck  Chronic cervical pain  Unsteadiness on feet     Problem List Patient Active Problem List   Diagnosis Date Noted  . Routine general medical examination at a health care facility 12/11/2016  . Primary osteoarthritis of left hip 05/01/2016  . Autoimmune disease ([place]) 11/29/2015  . High risk medication use 11/29/2015  . Vitamin D deficiency 11/29/2015  . Macular degeneration 11/29/2015  . Bilateral lower extremity edema 10/26/2015  . H/O total knee replacement, bilateral 12/09/2013  . Breast mass, left 01/18/2011  . Obesity 12/08/2009  . Venous (peripheral) insufficiency 07/28/2009  . PAIN IN JOINT, ANKLE AND FOOT 01/12/2008  . Obstructive sleep apnea 08/29/2007  . GAIT DISTURBANCE 08/14/2007  . Depression 08/26/2006  . Essential hypertension 08/26/2006  . Primary osteoarthritis of both hands 08/26/2006  . Urinary incontinence 08/26/2006   Gar Ponto MS, PT 12/17/19 7:12 AM  [place] [facility] [address] [place], [place], [address] Phone: [phone]   Fax:  [phone]  Name: Taylor Mccall MRN: [id number] Date of Birth: November 21, 1945

## 2019-12-22 ENCOUNTER — Other Ambulatory Visit: Payer: Self-pay

## 2019-12-22 ENCOUNTER — Ambulatory Visit: Payer: Medicare HMO

## 2019-12-22 DIAGNOSIS — R2681 Unsteadiness on feet: Secondary | ICD-10-CM

## 2019-12-22 DIAGNOSIS — R29898 Other symptoms and signs involving the musculoskeletal system: Secondary | ICD-10-CM

## 2019-12-22 DIAGNOSIS — R2689 Other abnormalities of gait and mobility: Secondary | ICD-10-CM | POA: Diagnosis not present

## 2019-12-22 DIAGNOSIS — G8929 Other chronic pain: Secondary | ICD-10-CM

## 2019-12-22 DIAGNOSIS — M542 Cervicalgia: Secondary | ICD-10-CM

## 2019-12-22 DIAGNOSIS — M6281 Muscle weakness (generalized): Secondary | ICD-10-CM

## 2019-12-22 DIAGNOSIS — R262 Difficulty in walking, not elsewhere classified: Secondary | ICD-10-CM

## 2019-12-22 NOTE — Therapy (Signed)
[place], [place], [address] Phone: [phone]   Fax:  [phone]  Physical Therapy Treatment  Patient Details  Name: Taylor Mccall MRN: [id number] Date of Birth: 1945/11/24 Referring Provider (PT): Jessy Oto, MD   Encounter Date: 12/22/2019   PT End of Session - 12/22/19 1250    Visit Number 3    Number of Visits 13    Date for PT Re-Evaluation 01/23/20    Authorization Type [employer] MEDICARE HMO/PPO    Authorization Time Period FOTO on 6th and 11th visits    Progress Note Due on Visit 10    PT Start Time 1225    PT Stop Time 1315    PT Time Calculation (min) 50 min    Equipment Utilized During Treatment --   4wrw   Activity Tolerance Patient tolerated treatment well    Behavior During Therapy [facility] for tasks assessed/performed           Past Medical History:  Diagnosis Date  . Anxiety   . Blood transfusion without reported diagnosis   . Breast mass, left   . Cataract   . Depression   . Gait disturbance   . GERD (gastroesophageal reflux disease)   . Hypertension   . Lymphedema of both lower extremities    Seeing OT at [place] to Left Lower leg  . Obesity   . Osteoarthritis   . Rheumatoid arthritis(714.0)   . Sleep apnea    cpap  . Urinary incontinence   . Venous insufficiency     Past Surgical History:  Procedure Laterality Date  . ABDOMINAL HYSTERECTOMY    . BLADDER SURGERY     sling  . BLADDER SUSPENSION  2009  . BREAST REDUCTION SURGERY    . COLONOSCOPY    . ESOPHAGOGASTRODUODENOSCOPY    . EYE SURGERY    . REPLACEMENT TOTAL KNEE Left   . TOE SURGERY Left    Left great toe  . toenail removal Right    all 5 toenails  . TOTAL HIP ARTHROPLASTY    . TOTAL KNEE ARTHROPLASTY Right 12/09/2013   Procedure: Right Total Knee Arthroplasty;  Surgeon: Newt Minion, MD;  Location: [facility];  Service: Orthopedics;  Laterality: Right;  Marland Kitchen VESICOVAGINAL  FISTULA CLOSURE W/ TAH      There were no vitals filed for this visit.   Subjective Assessment - 12/22/19 1342    Subjective Pt repors she is continuing to do well with her neck and has been completing the HEP for her legs consistently    Diagnostic tests 11/12/19: IMPRESSION:1. Multilevel degenerative changes of the cervical spine asdescribed above, with moderate spinal canal stenosis at C3-4 andC5-6.2. Severe bilateral neural foraminal narrowing at C3-4.3. Moderate left neural foraminal narrowing at C7-T1.4. Cystic appearing structure in the left petrous apex, onlypartially evaluated. Correlation brain MRI suggested.    Patient Stated Goals For my neck to hurt less and to walk with better balance    Currently in Pain? Yes    Pain Score 3     Pain Location Neck    Pain Orientation Right;Left;Posterior;Upper    Pain Descriptors / Indicators Aching    Pain Onset More than a month ago    Pain Frequency Constant              [facility] PT Assessment - 12/22/19 0001      Balance   Balance Assessed Yes  Standardized Balance Assessment   Standardized Balance Assessment Timed Up and Go Test      Timed Up and Go Test   Normal TUG (seconds) 13.1   13.5, 13.1 c RW                        [facility] Adult PT Treatment/Exercise - 12/22/19 0001      Neck Exercises: Seated   Neck Retraction 10 reps    Neck Retraction Limitations Cervical retraction c L SB, 3 sec for each ex      Knee/Hip Exercises: Seated   Long Arc Quad Right;Left;10 reps;15 reps    Long Arc Quad Limitations 3 sec    Ball Squeeze 10x2; 5 sec    Clamshell with TheraBand Red   15x2 each LE   Knee/Hip Flexion R and L 15x2, RTB    Other Seated Knee/Hip Exercises Ankle rocks, 15x    Sit to [employer] with UE support   7x2     Manual Therapy   Manual Therapy Soft tissue mobilization;Manual Traction    Soft tissue mobilization STW to upper trp and cervaical paraspinals    Manual Traction Cervical traction with  suboccipiatal release                  PT Education - 12/22/19 1344    Education Details Cervical retraction c L SB was addedd to pt's HEP. Initial FOTO results reviewed.    Person(s) Educated Patient    Methods Explanation;Demonstration;Tactile cues;Verbal cues    Comprehension Verbalized understanding;Returned demonstration            PT Short Term Goals - 12/03/19 2304      PT SHORT TERM GOAL #1   Title Pt will be Ind c an initial HEP    Period Weeks    Status New    Target Date 12/24/19      PT SHORT TERM GOAL #2   Title Pt will voice undestanding of measures to reduce and manage neck pain.    Status New    Target Date 12/24/19             PT Long Term Goals - 12/22/19 1350      PT LONG TERM GOAL #5   Target Date 01/23/20      Additional Long Term Goals   Additional Long Term Goals Yes      PT LONG TERM GOAL #6   Title Improve pt's TUG time to less than 11 sec c a RW    Baseline 13.3 sec c RW    Status New    Target Date 01/23/20                 Plan - 12/22/19 1253    Clinical Impression Statement TUG was assessed with pt completing at an appropriate time c a RW. Pt reported cervical retraction c L SB and manual cervical traction c suboccipital release felt good to her R upper neck pain. Pt returned demonstation of her LE strengthening exs following VC for most effective technique.    Personal Factors and Comorbidities Age;Comorbidity 3+    Comorbidities OA, RA, obesity, depression, anxiety, HTN    Examination-Activity Limitations Locomotion Level;Transfers;Reach Overhead;Caring for Others;Carry;Dressing;Stand;Stairs;Squat;Sleep    Stability/Clinical Decision Making Evolving/Moderate complexity    Clinical Decision Making Moderate    Rehab Potential Good    PT Frequency 2x / week    PT Duration 6 weeks    PT Treatment/Interventions  ADLs/Self Care Home Management;Cryotherapy;Electrical Stimulation;Ultrasound;Moist Heat;Iontophoresis 4mg /ml  Dexamethasone;Gait training;Stair training;Functional mobility training;Therapeutic activities;Therapeutic exercise;Balance training;Manual techniques;Patient/family education;Passive range of motion;Dry needling;Energy conservation;Taping;Vasopneumatic Device    PT Next Visit Plan Assess response to HEP, 2MWT over the next 2 visits and set goals. Review FOTO    PT Home Exercise Plan [id number]    Consulted and Agree with Plan of Care Patient           Patient will benefit from skilled therapeutic intervention in order to improve the following deficits and impairments:  Abnormal gait, Decreased range of motion, Difficulty walking, Decreased endurance, Obesity, Decreased activity tolerance, Pain, Decreased balance, Decreased mobility, Decreased strength, Postural dysfunction  Visit Diagnosis: Muscle weakness (generalized)  Difficulty in walking, not elsewhere classified  Other abnormalities of gait and mobility  Decreased ROM of neck  Chronic cervical pain  Unsteadiness on feet     Problem List Patient Active Problem List   Diagnosis Date Noted  . Routine general medical examination at a health care facility 12/11/2016  . Primary osteoarthritis of left hip 05/01/2016  . Autoimmune disease ([place]) 11/29/2015  . High risk medication use 11/29/2015  . Vitamin D deficiency 11/29/2015  . Macular degeneration 11/29/2015  . Bilateral lower extremity edema 10/26/2015  . H/O total knee replacement, bilateral 12/09/2013  . Breast mass, left 01/18/2011  . Obesity 12/08/2009  . Venous (peripheral) insufficiency 07/28/2009  . PAIN IN JOINT, ANKLE AND FOOT 01/12/2008  . Obstructive sleep apnea 08/29/2007  . GAIT DISTURBANCE 08/14/2007  . Depression 08/26/2006  . Essential hypertension 08/26/2006  . Primary osteoarthritis of both hands 08/26/2006  . Urinary incontinence 08/26/2006    Gar Ponto MS, PT 12/22/19 1:56 PM  [facility] [address] [place], [place], [address] Phone: [phone]   Fax:  [phone]  Name: LENOX LADOUCEUR MRN: [id number] Date of Birth: 1945-08-01

## 2019-12-23 ENCOUNTER — Encounter (INDEPENDENT_AMBULATORY_CARE_PROVIDER_SITE_OTHER): Payer: Self-pay | Admitting: Ophthalmology

## 2019-12-23 ENCOUNTER — Ambulatory Visit (INDEPENDENT_AMBULATORY_CARE_PROVIDER_SITE_OTHER): Payer: Medicare HMO | Admitting: Ophthalmology

## 2019-12-23 DIAGNOSIS — H35022 Exudative retinopathy, left eye: Secondary | ICD-10-CM

## 2019-12-23 DIAGNOSIS — H353222 Exudative age-related macular degeneration, left eye, with inactive choroidal neovascularization: Secondary | ICD-10-CM

## 2019-12-23 DIAGNOSIS — H401132 Primary open-angle glaucoma, bilateral, moderate stage: Secondary | ICD-10-CM

## 2019-12-23 DIAGNOSIS — H353112 Nonexudative age-related macular degeneration, right eye, intermediate dry stage: Secondary | ICD-10-CM

## 2019-12-23 DIAGNOSIS — G4733 Obstructive sleep apnea (adult) (pediatric): Secondary | ICD-10-CM

## 2019-12-23 DIAGNOSIS — Z79899 Other long term (current) drug therapy: Secondary | ICD-10-CM | POA: Diagnosis not present

## 2019-12-23 NOTE — Assessment & Plan Note (Signed)
Mild OD, hard drusen, no active disease

## 2019-12-23 NOTE — Assessment & Plan Note (Signed)
Follow-up with Dr. Kathlen Mody, Dr. 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

## 2019-12-23 NOTE — Assessment & Plan Note (Signed)
This condition OS now inactive and stabilized with fibrotic extra foveal CNVM temporally.  Good PRP, breakthrough hemorrhage treated in years past via vitrectomy with now clearance of vision.  Central foveal RPE atrophy from the previous subretinal hemorrhage

## 2019-12-23 NOTE — Progress Notes (Signed)
12/23/2019     CHIEF COMPLAINT Patient presents for Retina Follow Up   HISTORY OF PRESENT ILLNESS: Taylor Mccall is a 74 y.o. female who presents to the clinic today for:   HPI    Retina Follow Up    Patient presents with  Dry AMD.  In both eyes.  This started 1 year ago.  Severity is mild.  Duration of 1 year.  Since onset it is stable.          Comments    1 Year AMD F/U OU  Pt reports stable VA OU since last visit. Pt reports intermittent floaters OU.        Last edited by Rockie Neighbours, White on 12/23/2019  8:48 AM. (History)      Referring physician: Dorothyann Peng, NP [place],  [place] [id number]  HISTORICAL INFORMATION:   Selected notes from the MEDICAL RECORD NUMBER    Lab Results  Component Value Date   HGBA1C 5.0 08/15/2016     CURRENT MEDICATIONS: Current Outpatient Medications (Ophthalmic Drugs)  Medication Sig  . carboxymethylcellulose (REFRESH TEARS) 0.5 % SOLN Place 2 drops into the right eye as needed (for itchy eyes).  . Travoprost, BAK Free, (TRAVATAN) 0.004 % SOLN ophthalmic solution Place 1 drop into both eyes at bedtime.   No current facility-administered medications for this visit. (Ophthalmic Drugs)   Current Outpatient Medications (Other)  Medication Sig  . ALEVE 220 MG CAPS Take 440 mg by mouth daily as needed.  Marland Kitchen aspirin 81 MG tablet Take 81 mg by mouth daily.  . baclofen (LIORESAL) 10 MG tablet Take 1 tablet (10 mg total) by mouth 2 (two) times daily.  Marland Kitchen BIOTIN PO [employer] [place] (Hair, Skin & Nails) Gummy  . furosemide (LASIX) 20 MG tablet TAKE 1 TABLET (20 MG TOTAL) BY MOUTH 2 (TWO) TIMES DAILY. **DUE FOR YEARLY PHYSICAL** (Patient taking differently: Take 20 mg by mouth 2 (two) times daily. )  . gabapentin (NEURONTIN) 100 MG capsule Take 1 capsule (100 mg total) by mouth at bedtime.  . hydroxychloroquine (PLAQUENIL) 200 MG tablet Take 1 tablet (200 mg total) by mouth 2 (two) times daily.  Marland Kitchen losartan  (COZAAR) 25 MG tablet Take 1 tablet (25 mg total) by mouth daily.  . Multiple Vitamin (MULTIVITAMIN) tablet Take 1 tablet by mouth daily.  . Multiple Vitamins-Minerals (PRESERVISION AREDS 2) CAPS Take 2 capsules by mouth daily.  . NON FORMULARY 1 each by Other route See admin instructions. Use CPAP nightly.  . Omega-3 Fatty Acids (FISH OIL) 600 MG CAPS Take 1 capsule by mouth 2 (two) times daily.  . pantoprazole (PROTONIX) 40 MG tablet TAKE ONE TABLET BY MOUTH ONCE DAILY BEFORE BREAKFAST  . potassium chloride SA (KLOR-CON) 20 MEQ tablet Take 2 tablets (40 mEq total) by mouth every morning.  . sertraline (ZOLOFT) 100 MG tablet Take 100 mg by mouth daily.  . vitamin B-12 (CYANOCOBALAMIN) 1000 MCG tablet Take 1,000 mcg by mouth daily.  . vitamin E 400 UNIT capsule Take 400 Units by mouth daily.   No current facility-administered medications for this visit. (Other)      REVIEW OF SYSTEMS:    ALLERGIES Allergies  Allergen Reactions  . Penicillins Rash    PAST MEDICAL HISTORY Past Medical History:  Diagnosis Date  . Anxiety   . Blood transfusion without reported diagnosis   . Breast mass, left   . Cataract   . Depression   . Gait disturbance   .  GERD (gastroesophageal reflux disease)   . Hypertension   . Lymphedema of both lower extremities    Seeing OT at [facility] to Left Lower leg  . Obesity   . Osteoarthritis   . Rheumatoid arthritis(714.0)   . Sleep apnea    cpap  . Urinary incontinence   . Venous insufficiency    Past Surgical History:  Procedure Laterality Date  . ABDOMINAL HYSTERECTOMY    . BLADDER SURGERY     sling  . BLADDER SUSPENSION  2009  . BREAST REDUCTION SURGERY    . COLONOSCOPY    . ESOPHAGOGASTRODUODENOSCOPY    . EYE SURGERY    . REPLACEMENT TOTAL KNEE Left   . TOE SURGERY Left    Left great toe  . toenail removal Right    all 5 toenails  . TOTAL HIP ARTHROPLASTY    . TOTAL KNEE ARTHROPLASTY Right 12/09/2013    Procedure: Right Total Knee Arthroplasty;  Surgeon: Newt Minion, MD;  Location: [facility];  Service: Orthopedics;  Laterality: Right;  Marland Kitchen VESICOVAGINAL FISTULA CLOSURE W/ TAH      FAMILY HISTORY Family History  Problem Relation Age of Onset  . Diabetes Other   . Stroke Other   . Asthma Brother   . Heart disease Brother   . Coronary artery disease Father   . Skin cancer Father   . Heart disease Father   . Coronary artery disease Brother   . Colon cancer Neg Hx   . Esophageal cancer Neg Hx   . Rectal cancer Neg Hx   . Stomach cancer Neg Hx     SOCIAL HISTORY Social History   Tobacco Use  . Smoking status: Former Smoker    Packs/day: 0.30    Years: 10.00    Pack years: 3.00    Types: Cigarettes    Quit date: 02/05/1978    Years since quitting: 41.9  . Smokeless tobacco: Never Used  . Tobacco comment: over 25 years ago...pt doesnt remember when she quit.   Vaping Use  . Vaping Use: Never used  Substance Use Topics  . Alcohol use: No  . Drug use: No         OPHTHALMIC EXAM:  Base Eye Exam    Visual Acuity (ETDRS)      Right Left   Dist cc 20/40 +2 20/400   Dist ph cc 20/20 20/200 -1   Correction: Glasses       Tonometry (Tonopen, 8:48 AM)      Right Left   Pressure 12 14       Pupils      Dark Light Shape React APD   Right 4 3 Round Brisk None   Left 4 3 Round Brisk Trace       Visual Fields (Counting fingers)      Left Right     Full   Restrictions Total superior temporal, inferior temporal deficiencies        Extraocular Movement      Right Left    Full Full       Neuro/Psych    Oriented x3: Yes   Mood/Affect: Normal       Dilation    Both eyes: 1.0% Mydriacyl, 2.5% Phenylephrine @ 8:52 AM        Slit Lamp and Fundus Exam    External Exam      Right Left   External Normal Normal       Slit Lamp Exam  Right Left   Lids/Lashes Normal Normal   Conjunctiva/Sclera White and quiet White and quiet   Cornea Clear Clear   Anterior  Chamber Deep and quiet Deep and quiet   Iris Round and reactive Round and reactive   Lens Centered posterior chamber intraocular lens Centered posterior chamber intraocular lens   Anterior Vitreous Normal Normal       Fundus Exam      Right Left   Posterior Vitreous ,,, Posterior vitreous detachment Clear, vitrectomized   Disc Normal Normal   C/D Ratio 0.65 0.7   Macula Normal, no pigmentary changes Geographic atrophy centrally, no edema   Vessels Normal Laser chorioretinal scars temporally, with large area of subretinal fibrotic change representing old involutional PECHR, no active vascular abnormalities   Periphery Normal Good peripheral PRP and atrophic retina          IMAGING AND PROCEDURES  Imaging and Procedures for 12/23/19  OCT, Retina - OU - Both Eyes       Right Eye Quality was good. Scan locations included subfoveal. Central Foveal Thickness: 248. Progression has been stable. Findings include retinal drusen .   Left Eye Quality was good. Scan locations included subfoveal. Central Foveal Thickness: 185. Progression has been stable. Findings include outer retinal atrophy, inner retinal atrophy.   Notes Diffuse mild atrophy, no toxicity OD  OS with central RPE atrophy, stable for years as result of prior massive subretinal hemorrhage Resolved via vitrectomy and stabilization of peripheral PECHR                ASSESSMENT/PLAN:  Obstructive sleep apnea Patient reports excellent compliance with CPAP use, expressed to her the importance of continued maximum oxygenation by using CPAP through the night to prevent damage to her eyes and brain  Intermediate stage nonexudative age-related macular degeneration of right eye Mild OD, hard drusen, no active disease  Exudative age-related macular degeneration of left eye with inactive choroidal neovascularization ([place]) Peripheral form of PECHR, in the past with breakthrough vitreous hemorrhage, now stabilized years  after vitrectomy  And laser photocoagulation  Exudative retinopathy of left eye This condition OS now inactive and stabilized with fibrotic extra foveal CNVM temporally.  Good PRP, breakthrough hemorrhage treated in years past via vitrectomy with now clearance of vision.  Central foveal RPE atrophy from the previous subretinal hemorrhage  High risk medication use Follow-up with Dr. Kathlen Mody, [facility] for central 10-2 red target testing macular right eye  OS with permanent damage from other causes, not Plaquenil related      ICD-10-CM   1. Exudative retinopathy of left eye  H35.022 OCT, Retina - OU - Both Eyes  2. Intermediate stage nonexudative age-related macular degeneration of right eye  H35.3112 OCT, Retina - OU - Both Eyes  3. Exudative age-related macular degeneration of left eye with inactive choroidal neovascularization (HCC)  H35.3222 OCT, Retina - OU - Both Eyes  4. Primary open angle glaucoma of both eyes, moderate stage  H40.1132   5. Obstructive sleep apnea  G47.33   6. High risk medication use  Z79.899     1.  No active retinal therapy warranted at this time.  No signs of Plaquenil toxicity by clinical examination and OCT  2.  Follow-up with Dr. Quentin Ore, [facility], for central 10-2 red target testing OD  3.  Patient informed of the of the critical importance of nightly compliance with CPAP use to maximize the oxygenation of the center of the retina  OU and her brain.  Ophthalmic Meds Ordered this visit:  No orders of the defined types were placed in this encounter.      Return in about 1 year (around 12/22/2020) for COLOR FP, dilate.  There are no Patient Instructions on file for this visit.   Explained the diagnoses, plan, and follow up with the patient and they expressed understanding.  Patient expressed understanding of the importance of proper follow up care.   Clent Demark Avian Konigsberg M.D. Diseases & Surgery of the Retina and Vitreous Retina &  Diabetic [place] 12/23/19     Abbreviations: M myopia (nearsighted); A astigmatism; H hyperopia (farsighted); P presbyopia; Mrx spectacle prescription;  CTL contact lenses; OD right eye; OS left eye; OU both eyes  XT exotropia; ET esotropia; PEK punctate epithelial keratitis; PEE punctate epithelial erosions; DES dry eye syndrome; MGD meibomian gland dysfunction; ATs artificial tears; PFAT's preservative free artificial tears; [place] nuclear sclerotic cataract; PSC posterior subcapsular cataract; ERM epi-retinal membrane; PVD posterior vitreous detachment; RD retinal detachment; DM diabetes mellitus; DR diabetic retinopathy; NPDR non-proliferative diabetic retinopathy; PDR proliferative diabetic retinopathy; CSME clinically significant macular edema; DME diabetic macular edema; dbh dot blot hemorrhages; CWS cotton wool spot; POAG primary open angle glaucoma; C/D cup-to-disc ratio; HVF humphrey visual field; GVF goldmann visual field; OCT optical coherence tomography; IOP intraocular pressure; BRVO Branch retinal vein occlusion; CRVO central retinal vein occlusion; CRAO central retinal artery occlusion; BRAO branch retinal artery occlusion; RT retinal tear; SB scleral buckle; PPV pars plana vitrectomy; VH Vitreous hemorrhage; PRP panretinal laser photocoagulation; IVK intravitreal kenalog; VMT vitreomacular traction; MH Macular hole;  NVD neovascularization of the disc; NVE neovascularization elsewhere; AREDS age related eye disease study; ARMD age related macular degeneration; POAG primary open angle glaucoma; EBMD epithelial/anterior basement membrane dystrophy; ACIOL anterior chamber intraocular lens; IOL intraocular lens; PCIOL posterior chamber intraocular lens; Phaco/IOL phacoemulsification with intraocular lens placement; [place] photorefractive keratectomy; LASIK laser assisted in situ keratomileusis; HTN hypertension; DM diabetes mellitus; COPD chronic obstructive pulmonary disease

## 2019-12-23 NOTE — Assessment & Plan Note (Signed)
Patient reports excellent compliance with CPAP use, expressed to her the importance of continued maximum oxygenation by using CPAP through the night to prevent damage to her eyes and brain

## 2019-12-23 NOTE — Assessment & Plan Note (Signed)
Peripheral form of PECHR, in the past with breakthrough vitreous hemorrhage, now stabilized years after vitrectomy  And laser photocoagulation

## 2019-12-24 ENCOUNTER — Encounter: Payer: Self-pay | Admitting: Specialist

## 2019-12-24 ENCOUNTER — Ambulatory Visit (INDEPENDENT_AMBULATORY_CARE_PROVIDER_SITE_OTHER): Payer: Medicare HMO | Admitting: Specialist

## 2019-12-24 ENCOUNTER — Other Ambulatory Visit: Payer: Self-pay

## 2019-12-24 VITALS — BP 142/84 | HR 71 | Ht 70.0 in | Wt 285.0 lb

## 2019-12-24 DIAGNOSIS — R200 Anesthesia of skin: Secondary | ICD-10-CM

## 2019-12-24 DIAGNOSIS — M542 Cervicalgia: Secondary | ICD-10-CM | POA: Diagnosis not present

## 2019-12-24 DIAGNOSIS — Z96642 Presence of left artificial hip joint: Secondary | ICD-10-CM | POA: Diagnosis not present

## 2019-12-24 DIAGNOSIS — M25552 Pain in left hip: Secondary | ICD-10-CM | POA: Diagnosis not present

## 2019-12-24 DIAGNOSIS — W19XXXD Unspecified fall, subsequent encounter: Secondary | ICD-10-CM | POA: Diagnosis not present

## 2019-12-24 DIAGNOSIS — M4802 Spinal stenosis, cervical region: Secondary | ICD-10-CM

## 2019-12-24 DIAGNOSIS — M4712 Other spondylosis with myelopathy, cervical region: Secondary | ICD-10-CM | POA: Diagnosis not present

## 2019-12-24 DIAGNOSIS — G8929 Other chronic pain: Secondary | ICD-10-CM

## 2019-12-24 DIAGNOSIS — Z96653 Presence of artificial knee joint, bilateral: Secondary | ICD-10-CM | POA: Diagnosis not present

## 2019-12-24 NOTE — Patient Instructions (Addendum)
  Plan: Has pain in the knees with stiffness and had surgery 6 and 10 years ago. Avoid overhead lifting and overhead use of the arms. Do not lift greater than 5 lbs. Adjust head rest in vehicle to prevent hyperextension if rear ended. Take extra precautions to avoid falling, including use of a cane if you feel weak. Keep appointment to have the EMG/NCV of the arms done to assess for nerve changes due to the nerves having pressure either at The neck level or in the arms. Appointment to be made to assess the left hip for poly wear and need to consider a revision of the liner with Dr. Ninfa Linden.  Continue with therapy.

## 2019-12-24 NOTE — Progress Notes (Signed)
Office Visit Note   Patient: Taylor Mccall           Date of Birth: September 24, 1945           MRN: [id number] Visit Date: 12/24/2019              Requested by: Dorothyann Peng, NP [place],  [place] [address] PCP: Dorothyann Peng, NP   Assessment & Plan: Visit Diagnoses:  1. Spinal stenosis of cervical region   2. Cervicalgia   3. Arm numbness   4. Other spondylosis with myelopathy, cervical region   5. H/O total knee replacement, bilateral   6. Falls, subsequent encounter     Plan: Has pain in the knees with stiffness and had surgery 6 and 10 years ago. Avoid overhead lifting and overhead use of the arms. Do not lift greater than 5 lbs. Adjust head rest in vehicle to prevent hyperextension if rear ended. Take extra precautions to avoid falling, including use of a cane if you feel weak. Keep appointment to have the EMG/NCV of the arms done to assess for nerve changes due to the nerves having pressure either at The neck level or in the arms. Appointment to be made to assess the left hip for poly wear and need to consider a revision of the liner with Dr. Ninfa Linden.   Follow-Up Instructions: No follow-ups on file.   Orders:  No orders of the defined types were placed in this encounter.  No orders of the defined types were placed in this encounter.     Procedures: No procedures performed   Clinical Data: No additional findings.   Subjective: Chief Complaint  Patient presents with  . Neck - Follow-up    Doing better since doing PT    74 year old female with history of cervicalgia and stiffness. Has numbness into the hands and weakness. She is in therapy and is feeling some better. Working on ROM and heat and stretching exercises. No bowel or bladder changes. She is taking lasix and up at night. Uses a walker for ambulation.    Review of Systems  Constitutional: Negative.   HENT: Negative.   Eyes: Negative.   Respiratory: Negative.     Cardiovascular: Negative.   Gastrointestinal: Negative.   Endocrine: Negative.   Genitourinary: Negative.   Musculoskeletal: Negative.   Skin: Negative.   Allergic/Immunologic: Negative.   Neurological: Negative.   Hematological: Negative.   Psychiatric/Behavioral: Negative.      Objective: Vital Signs: BP (!) 142/84 (BP Location: Left Arm, Patient Position: Sitting)   Pulse 71   Ht 5\' 10"  (1.778 m)   Wt 285 lb (129.3 kg)   BMI 40.89 kg/m   Physical Exam Constitutional:      Appearance: She is well-developed.  HENT:     Head: Normocephalic and atraumatic.  Eyes:     Pupils: Pupils are equal, round, and reactive to light.  Pulmonary:     Effort: Pulmonary effort is normal.     Breath sounds: Normal breath sounds.  Abdominal:     General: Bowel sounds are normal.     Palpations: Abdomen is soft.  Musculoskeletal:     Cervical back: Normal range of motion and neck supple.     Lumbar back: Negative right straight leg raise test and negative left straight leg raise test.  Skin:    General: Skin is warm and dry.  Neurological:     Mental Status: She is alert and oriented to person,  place, and time.  Psychiatric:        Behavior: Behavior normal.        Thought Content: Thought content normal.        Judgment: Judgment normal.     Back Exam   Tenderness  The patient is experiencing tenderness in the cervical.  Range of Motion  Extension: abnormal  Flexion: abnormal  Lateral bend right: normal  Lateral bend left: normal  Rotation right: normal  Rotation left: normal   Muscle Strength  Right Quadriceps:  5/5  Left Quadriceps:  5/5  Right Hamstrings:  5/5  Left Hamstrings:  5/5   Tests  Straight leg raise right: negative Straight leg raise left: negative  Reflexes  Patellar: 2/4 Achilles: 2/4 Babinski's sign: normal   Other  Toe walk: normal Heel walk: normal Sensation: normal      Specialty Comments:  No specialty comments  available.  Imaging: No results found.   PMFS History: Patient Active Problem List   Diagnosis Date Noted  . Intermediate stage nonexudative age-related macular degeneration of right eye 12/23/2019  . Exudative age-related macular degeneration of left eye with inactive choroidal neovascularization ([place]) 12/23/2019  . Primary open angle glaucoma of both eyes, moderate stage 12/23/2019  . Exudative retinopathy of left eye 12/23/2019  . Routine general medical examination at a health care facility 12/11/2016  . Primary osteoarthritis of left hip 05/01/2016  . Autoimmune disease ([place]) 11/29/2015  . High risk medication use 11/29/2015  . Vitamin D deficiency 11/29/2015  . Macular degeneration 11/29/2015  . Bilateral lower extremity edema 10/26/2015  . H/O total knee replacement, bilateral 12/09/2013  . Breast mass, left 01/18/2011  . Obesity 12/08/2009  . Venous (peripheral) insufficiency 07/28/2009  . PAIN IN JOINT, ANKLE AND FOOT 01/12/2008  . Obstructive sleep apnea 08/29/2007  . GAIT DISTURBANCE 08/14/2007  . Depression 08/26/2006  . Essential hypertension 08/26/2006  . Primary osteoarthritis of both hands 08/26/2006  . Urinary incontinence 08/26/2006   Past Medical History:  Diagnosis Date  . Anxiety   . Blood transfusion without reported diagnosis   . Breast mass, left   . Cataract   . Depression   . Gait disturbance   . GERD (gastroesophageal reflux disease)   . Hypertension   . Lymphedema of both lower extremities    Seeing OT at [facility] to Left Lower leg  . Obesity   . Osteoarthritis   . Rheumatoid arthritis(714.0)   . Sleep apnea    cpap  . Urinary incontinence   . Venous insufficiency     Family History  Problem Relation Age of Onset  . Diabetes Other   . Stroke Other   . Asthma Brother   . Heart disease Brother   . Coronary artery disease Father   . Skin cancer Father   . Heart disease Father   . Coronary artery disease  Brother   . Colon cancer Neg Hx   . Esophageal cancer Neg Hx   . Rectal cancer Neg Hx   . Stomach cancer Neg Hx     Past Surgical History:  Procedure Laterality Date  . ABDOMINAL HYSTERECTOMY    . BLADDER SURGERY     sling  . BLADDER SUSPENSION  2009  . BREAST REDUCTION SURGERY    . COLONOSCOPY    . ESOPHAGOGASTRODUODENOSCOPY    . EYE SURGERY    . REPLACEMENT TOTAL KNEE Left   . TOE SURGERY Left    Left great toe  .  toenail removal Right    all 5 toenails  . TOTAL HIP ARTHROPLASTY    . TOTAL KNEE ARTHROPLASTY Right 12/09/2013   Procedure: Right Total Knee Arthroplasty;  Surgeon: Newt Minion, MD;  Location: [facility];  Service: Orthopedics;  Laterality: Right;  Marland Kitchen VESICOVAGINAL FISTULA CLOSURE W/ TAH     Social History   Occupational History  . Not on file  Tobacco Use  . Smoking status: Former Smoker    Packs/day: 0.30    Years: 10.00    Pack years: 3.00    Types: Cigarettes    Quit date: 02/05/1978    Years since quitting: 41.9  . Smokeless tobacco: Never Used  . Tobacco comment: over 25 years ago...pt doesnt remember when she quit.   Vaping Use  . Vaping Use: Never used  Substance and Sexual Activity  . Alcohol use: No  . Drug use: No  . Sexual activity: Not on file

## 2019-12-25 ENCOUNTER — Ambulatory Visit: Payer: Medicare HMO

## 2019-12-25 DIAGNOSIS — M542 Cervicalgia: Secondary | ICD-10-CM | POA: Diagnosis not present

## 2019-12-25 DIAGNOSIS — R2689 Other abnormalities of gait and mobility: Secondary | ICD-10-CM

## 2019-12-25 DIAGNOSIS — R29898 Other symptoms and signs involving the musculoskeletal system: Secondary | ICD-10-CM

## 2019-12-25 DIAGNOSIS — R262 Difficulty in walking, not elsewhere classified: Secondary | ICD-10-CM

## 2019-12-25 DIAGNOSIS — G8929 Other chronic pain: Secondary | ICD-10-CM

## 2019-12-25 DIAGNOSIS — M6281 Muscle weakness (generalized): Secondary | ICD-10-CM

## 2019-12-25 DIAGNOSIS — R2681 Unsteadiness on feet: Secondary | ICD-10-CM

## 2019-12-25 NOTE — Therapy (Signed)
[place], [place], [address] Phone: [phone]   Fax:  [phone]  Physical Therapy Treatment  Patient Details  Name: Taylor Mccall MRN: [id number] Date of Birth: 10-02-1945 Referring Provider (PT): Jessy Oto, MD   Encounter Date: 12/25/2019   PT End of Session - 12/25/19 1225    Visit Number 4    Number of Visits 13    Date for PT Re-Evaluation 01/23/20    Authorization Type [employer] MEDICARE HMO/PPO    Authorization Time Period FOTO on 6th and 11th visits    Progress Note Due on Visit 10    PT Start Time 1221    PT Stop Time 1300    PT Time Calculation (min) 39 min    Equipment Utilized During Treatment Other (comment)   RW   Activity Tolerance Patient tolerated treatment well    Behavior During Therapy [facility] for tasks assessed/performed           Past Medical History:  Diagnosis Date  . Anxiety   . Blood transfusion without reported diagnosis   . Breast mass, left   . Cataract   . Depression   . Gait disturbance   . GERD (gastroesophageal reflux disease)   . Hypertension   . Lymphedema of both lower extremities    Seeing OT at [place] to Left Lower leg  . Obesity   . Osteoarthritis   . Rheumatoid arthritis(714.0)   . Sleep apnea    cpap  . Urinary incontinence   . Venous insufficiency     Past Surgical History:  Procedure Laterality Date  . ABDOMINAL HYSTERECTOMY    . BLADDER SURGERY     sling  . BLADDER SUSPENSION  2009  . BREAST REDUCTION SURGERY    . COLONOSCOPY    . ESOPHAGOGASTRODUODENOSCOPY    . EYE SURGERY    . REPLACEMENT TOTAL KNEE Left   . TOE SURGERY Left    Left great toe  . toenail removal Right    all 5 toenails  . TOTAL HIP ARTHROPLASTY    . TOTAL KNEE ARTHROPLASTY Right 12/09/2013   Procedure: Right Total Knee Arthroplasty;  Surgeon: Newt Minion, MD;  Location: [place];  Service: Orthopedics;  Laterality: Right;  Marland Kitchen  VESICOVAGINAL FISTULA CLOSURE W/ TAH      There were no vitals filed for this visit.   Subjective Assessment - 12/25/19 1506    Subjective pt reports neck pain continues to improve, experiencing pain only wirh certain neck movements    Diagnostic tests 11/12/19: IMPRESSION:1. Multilevel degenerative changes of the cervical spine asdescribed above, with moderate spinal canal stenosis at C3-4 andC5-6.2. Severe bilateral neural foraminal narrowing at C3-4.3. Moderate left neural foraminal narrowing at C7-T1.4. Cystic appearing structure in the left petrous apex, onlypartially evaluated. Correlation brain MRI suggested.    Patient Stated Goals For my neck to hurt less and to walk with better balance    Pain Score 0-No pain    Pain Location Neck    Pain Orientation Right;Left;Posterior;Upper    Pain Descriptors / Indicators Aching    Pain Type Chronic pain    Pain Onset More than a month ago    Pain Frequency Intermittent              [facility] PT Assessment - 12/25/19 0001      Ambulation/Gait   Gait Comments 28mwt = 368ft  [place] Adult PT Treatment/Exercise - 12/25/19 0001      Knee/Hip Exercises: Aerobic   Nustep L5, 8 mins, UEs/LEs      Knee/Hip Exercises: Standing   Heel Raises Both;15 reps    Heel Raises Limitations toe lifts    Knee Flexion Right;Left;10 reps    Knee Flexion Limitations c hand A    Hip Abduction Right;Left;10 reps    Abduction Limitations c hand A    Hip Extension Right;10 reps    Extension Limitations c hnad A      Knee/Hip Exercises: Seated   Long Arc Quad Right;Left;15 reps    Long Arc Quad Limitations 3 sec                    PT Short Term Goals - 12/25/19 1515      PT SHORT TERM GOAL #1   Title Pt will be Ind c an initial HEP. Achieved    Status Achieved    Target Date 12/25/19      PT SHORT TERM GOAL #2   Title Pt will voice undestanding of measures to reduce and manage neck pain. Achieved     Status Achieved    Target Date 12/25/19             PT Long Term Goals - 12/25/19 1516      Additional Long Term Goals   Additional Long Term Goals Yes      PT LONG TERM GOAL #7   Title Improved 66mwt distance to 468ft as an indication of improved functional mobility    Status New    Target Date 01/23/20                 Plan - 12/25/19 1508    Clinical Impression Statement 64mwt was completed with pt walking 369ft. Pt participated in strengthening exs with CKC exs part of the program The NuStep was also introduced today. Pt tolerated today's session s adverse effects.    Personal Factors and Comorbidities Age;Comorbidity 3+    Comorbidities OA, RA, obesity, depression, anxiety, HTN    Examination-Activity Limitations Locomotion Level;Transfers;Reach Overhead;Caring for Others;Carry;Dressing;Stand;Stairs;Squat;Sleep    Stability/Clinical Decision Making Evolving/Moderate complexity    Clinical Decision Making Moderate    Rehab Potential Good    PT Frequency 2x / week    PT Duration 6 weeks    PT Treatment/Interventions ADLs/Self Care Home Management;Cryotherapy;Electrical Stimulation;Ultrasound;Moist Heat;Iontophoresis 4mg /ml Dexamethasone;Gait training;Stair training;Functional mobility training;Therapeutic activities;Therapeutic exercise;Balance training;Manual techniques;Patient/family education;Passive range of motion;Dry needling;Energy conservation;Taping;Vasopneumatic Device    PT Next Visit Plan progress strengthening exs and provided care to the neck as indicated    PT Home Exercise Plan [id number]    Consulted and Agree with Plan of Care Patient           Patient will benefit from skilled therapeutic intervention in order to improve the following deficits and impairments:  Abnormal gait, Decreased range of motion, Difficulty walking, Decreased endurance, Obesity, Decreased activity tolerance, Pain, Decreased balance, Decreased mobility, Decreased strength, Postural  dysfunction  Visit Diagnosis: Muscle weakness (generalized)  Difficulty in walking, not elsewhere classified  Other abnormalities of gait and mobility  Decreased ROM of neck  Chronic cervical pain  Unsteadiness on feet     Problem List Patient Active Problem List   Diagnosis Date Noted  . Intermediate stage nonexudative age-related macular degeneration of right eye 12/23/2019  . Exudative age-related macular degeneration of left eye with inactive choroidal neovascularization ([place]) 12/23/2019  . Primary open  angle glaucoma of both eyes, moderate stage 12/23/2019  . Exudative retinopathy of left eye 12/23/2019  . Routine general medical examination at a health care facility 12/11/2016  . Primary osteoarthritis of left hip 05/01/2016  . Autoimmune disease ([place]) 11/29/2015  . High risk medication use 11/29/2015  . Vitamin D deficiency 11/29/2015  . Macular degeneration 11/29/2015  . Bilateral lower extremity edema 10/26/2015  . H/O total knee replacement, bilateral 12/09/2013  . Breast mass, left 01/18/2011  . Obesity 12/08/2009  . Venous (peripheral) insufficiency 07/28/2009  . PAIN IN JOINT, ANKLE AND FOOT 01/12/2008  . Obstructive sleep apnea 08/29/2007  . GAIT DISTURBANCE 08/14/2007  . Depression 08/26/2006  . Essential hypertension 08/26/2006  . Primary osteoarthritis of both hands 08/26/2006  . Urinary incontinence 08/26/2006    Gar Ponto MS, PT 12/25/19 3:20 PM  [facility] [address] [place], [place], [address] Phone: [phone]   Fax:  [phone]  Name: COURTNEE MYER MRN: [id number] Date of Birth: 1945-06-19

## 2019-12-28 ENCOUNTER — Telehealth: Payer: Self-pay | Admitting: Physical Therapy

## 2019-12-28 ENCOUNTER — Encounter: Payer: Medicare HMO | Admitting: Physical Therapy

## 2019-12-28 NOTE — Telephone Encounter (Signed)
Left message regarding no show to appointment this morning. Left next appointment date/time and asked her to call if she needs to cancel or reschedule.

## 2019-12-30 ENCOUNTER — Ambulatory Visit: Payer: Medicare HMO

## 2019-12-30 ENCOUNTER — Other Ambulatory Visit: Payer: Self-pay

## 2019-12-30 DIAGNOSIS — G8929 Other chronic pain: Secondary | ICD-10-CM | POA: Diagnosis not present

## 2019-12-30 DIAGNOSIS — M6281 Muscle weakness (generalized): Secondary | ICD-10-CM | POA: Diagnosis not present

## 2019-12-30 DIAGNOSIS — R29898 Other symptoms and signs involving the musculoskeletal system: Secondary | ICD-10-CM | POA: Diagnosis not present

## 2019-12-30 DIAGNOSIS — R262 Difficulty in walking, not elsewhere classified: Secondary | ICD-10-CM

## 2019-12-30 DIAGNOSIS — R2681 Unsteadiness on feet: Secondary | ICD-10-CM

## 2019-12-30 DIAGNOSIS — M542 Cervicalgia: Secondary | ICD-10-CM

## 2019-12-30 DIAGNOSIS — R2689 Other abnormalities of gait and mobility: Secondary | ICD-10-CM

## 2019-12-30 NOTE — Therapy (Signed)
[place], [place], [address] Phone: [phone]   Fax:  [phone]  Physical Therapy Treatment  Patient Details  Name: Taylor Mccall MRN: [id number] Date of Birth: 1945/11/14 Referring Provider (PT): Jessy Oto, MD   Encounter Date: 12/30/2019   PT End of Session - 12/30/19 1230    Visit Number 5    Number of Visits 13    Date for PT Re-Evaluation 01/23/20    Authorization Type [employer] MEDICARE HMO/PPO    Authorization Time Period FOTO on 6th and 11th visits    Progress Note Due on Visit 10    PT Start Time 1221    PT Stop Time 1304    PT Time Calculation (min) 43 min    Equipment Utilized During Treatment Other (comment)   [id number]   Activity Tolerance Patient tolerated treatment well    Behavior During Therapy [facility] for tasks assessed/performed           Past Medical History:  Diagnosis Date  . Anxiety   . Blood transfusion without reported diagnosis   . Breast mass, left   . Cataract   . Depression   . Gait disturbance   . GERD (gastroesophageal reflux disease)   . Hypertension   . Lymphedema of both lower extremities    Seeing OT at [place] to Left Lower leg  . Obesity   . Osteoarthritis   . Rheumatoid arthritis(714.0)   . Sleep apnea    cpap  . Urinary incontinence   . Venous insufficiency     Past Surgical History:  Procedure Laterality Date  . ABDOMINAL HYSTERECTOMY    . BLADDER SURGERY     sling  . BLADDER SUSPENSION  2009  . BREAST REDUCTION SURGERY    . COLONOSCOPY    . ESOPHAGOGASTRODUODENOSCOPY    . EYE SURGERY    . REPLACEMENT TOTAL KNEE Left   . TOE SURGERY Left    Left great toe  . toenail removal Right    all 5 toenails  . TOTAL HIP ARTHROPLASTY    . TOTAL KNEE ARTHROPLASTY Right 12/09/2013   Procedure: Right Total Knee Arthroplasty;  Surgeon: Newt Minion, MD;  Location: [place] [facility];  Service: Orthopedics;  Laterality: Right;  Marland Kitchen  VESICOVAGINAL FISTULA CLOSURE W/ TAH      There were no vitals filed for this visit.   Subjective Assessment - 12/30/19 1250    Subjective Pt reports she is doing well today and offered no concerns    Diagnostic tests 11/12/19: IMPRESSION:1. Multilevel degenerative changes of the cervical spine asdescribed above, with moderate spinal canal stenosis at C3-4 andC5-6.2. Severe bilateral neural foraminal narrowing at C3-4.3. Moderate left neural foraminal narrowing at C7-T1.4. Cystic appearing structure in the left petrous apex, onlypartially evaluated. Correlation brain MRI suggested.    Patient Stated Goals For my neck to hurt less and to walk with better balance    Currently in Pain? No/denies    Pain Score 0-No pain    Pain Location Neck    Pain Orientation Right;Left;Posterior    Pain Descriptors / Indicators Aching    Pain Type Chronic pain    Pain Onset More than a month ago    Pain Frequency Intermittent    Multiple Pain Sites No                             [facility] Adult  PT Treatment/Exercise - 12/30/19 0001      Exercises   Exercises Neck;Knee/Hip      Neck Exercises: Seated   Neck Retraction 5 reps    Neck Retraction Limitations Cervical retraction c L SB, 3 sec for each ex    Cervical Rotation Right;Left;5 reps    Cervical Rotation Limitations 3 sec    Lateral Flexion Right;Left;5 reps    Lateral Flexion Limitations 3 sec      Knee/Hip Exercises: Standing   Heel Raises Both;15 reps    Heel Raises Limitations toe lifts    Knee Flexion Right;Left;15 reps    Knee Flexion Limitations c hand A    Hip Flexion Right;Left;15 reps    Hip Flexion Limitations c hand A    Hip Abduction Right;Left;15 reps    Abduction Limitations c hand A    Hip Extension Right;Left;15 reps    Extension Limitations c hand A    Other Standing Knee Exercises Side stepping 34ft x 4 at counter, pt was able to complete s hand A                    PT Short Term Goals -  12/25/19 1515      PT SHORT TERM GOAL #1   Title Pt will be Ind c an initial HEP. Achieved    Status Achieved    Target Date 12/25/19      PT SHORT TERM GOAL #2   Title Pt will voice undestanding of measures to reduce and manage neck pain. Achieved    Status Achieved    Target Date 12/25/19             PT Long Term Goals - 12/25/19 1516      Additional Long Term Goals   Additional Long Term Goals Yes      PT LONG TERM GOAL #7   Title Improved 15mwt distance to 442ft as an indication of improved functional mobility    Status New    Target Date 01/23/20                 Plan - 12/30/19 1643    Clinical Impression Statement Pt's neck pain has responded well to PT progream to address ROM and posture with well managed pain at a low level. PT to address LE strengh and balance was progressed being more strenuous and challanging. Pt tolerated the progression without adverse effects.    Personal Factors and Comorbidities Age;Comorbidity 3+    Comorbidities OA, RA, obesity, depression, anxiety, HTN    Examination-Activity Limitations Locomotion Level;Transfers;Reach Overhead;Caring for Others;Carry;Dressing;Stand;Stairs;Squat;Sleep    Stability/Clinical Decision Making Evolving/Moderate complexity    Clinical Decision Making Moderate    Rehab Potential Good    PT Frequency 2x / week    PT Duration 6 weeks    PT Treatment/Interventions ADLs/Self Care Home Management;Cryotherapy;Electrical Stimulation;Ultrasound;Moist Heat;Iontophoresis 4mg /ml Dexamethasone;Gait training;Stair training;Functional mobility training;Therapeutic activities;Therapeutic exercise;Balance training;Manual techniques;Patient/family education;Passive range of motion;Dry needling;Energy conservation;Taping;Vasopneumatic Device    PT Next Visit Plan Progress strengthening exs and provided care to the neck as indicated    PT Home Exercise Plan [id number]    Consulted and Agree with Plan of Care Patient             Patient will benefit from skilled therapeutic intervention in order to improve the following deficits and impairments:  Abnormal gait, Decreased range of motion, Difficulty walking, Decreased endurance, Obesity, Decreased activity tolerance, Pain, Decreased balance, Decreased mobility, Decreased strength, Postural dysfunction  Visit Diagnosis: Muscle weakness (generalized)  Difficulty in walking, not elsewhere classified  Other abnormalities of gait and mobility  Decreased ROM of neck  Chronic cervical pain  Unsteadiness on feet     Problem List Patient Active Problem List   Diagnosis Date Noted  . Intermediate stage nonexudative age-related macular degeneration of right eye 12/23/2019  . Exudative age-related macular degeneration of left eye with inactive choroidal neovascularization ([place]) 12/23/2019  . Primary open angle glaucoma of both eyes, moderate stage 12/23/2019  . Exudative retinopathy of left eye 12/23/2019  . Routine general medical examination at a health care facility 12/11/2016  . Primary osteoarthritis of left hip 05/01/2016  . Autoimmune disease ([place]) 11/29/2015  . High risk medication use 11/29/2015  . Vitamin D deficiency 11/29/2015  . Macular degeneration 11/29/2015  . Bilateral lower extremity edema 10/26/2015  . H/O total knee replacement, bilateral 12/09/2013  . Breast mass, left 01/18/2011  . Obesity 12/08/2009  . Venous (peripheral) insufficiency 07/28/2009  . PAIN IN JOINT, ANKLE AND FOOT 01/12/2008  . Obstructive sleep apnea 08/29/2007  . GAIT DISTURBANCE 08/14/2007  . Depression 08/26/2006  . Essential hypertension 08/26/2006  . Primary osteoarthritis of both hands 08/26/2006  . Urinary incontinence 08/26/2006   Gar Ponto MS, PT 12/30/19 5:02 PM  [facility] [address] [place], [place], [address] Phone: [phone]   Fax:  [phone]  Name: LASHAWN BROMWELL MRN:  [id number] Date of Birth: 02-23-1945

## 2020-01-05 ENCOUNTER — Encounter: Payer: Self-pay | Admitting: Physical Medicine and Rehabilitation

## 2020-01-05 ENCOUNTER — Other Ambulatory Visit: Payer: Self-pay

## 2020-01-05 ENCOUNTER — Ambulatory Visit (INDEPENDENT_AMBULATORY_CARE_PROVIDER_SITE_OTHER): Payer: Medicare HMO | Admitting: Physical Medicine and Rehabilitation

## 2020-01-05 ENCOUNTER — Ambulatory Visit (INDEPENDENT_AMBULATORY_CARE_PROVIDER_SITE_OTHER): Payer: Medicare HMO

## 2020-01-05 ENCOUNTER — Encounter: Payer: Self-pay | Admitting: Orthopaedic Surgery

## 2020-01-05 ENCOUNTER — Ambulatory Visit (INDEPENDENT_AMBULATORY_CARE_PROVIDER_SITE_OTHER): Payer: Medicare HMO | Admitting: Orthopaedic Surgery

## 2020-01-05 VITALS — Ht 69.0 in | Wt 273.4 lb

## 2020-01-05 DIAGNOSIS — T84068D Wear of articular bearing surface of other internal prosthetic joint, subsequent encounter: Secondary | ICD-10-CM

## 2020-01-05 DIAGNOSIS — R202 Paresthesia of skin: Secondary | ICD-10-CM | POA: Diagnosis not present

## 2020-01-05 DIAGNOSIS — G8929 Other chronic pain: Secondary | ICD-10-CM

## 2020-01-05 DIAGNOSIS — Z96642 Presence of left artificial hip joint: Secondary | ICD-10-CM

## 2020-01-05 DIAGNOSIS — Z96649 Presence of unspecified artificial hip joint: Secondary | ICD-10-CM | POA: Diagnosis not present

## 2020-01-05 DIAGNOSIS — M25552 Pain in left hip: Secondary | ICD-10-CM | POA: Diagnosis not present

## 2020-01-05 NOTE — Progress Notes (Signed)
Office Visit Note   Patient: Taylor Mccall           Date of Birth: Mar 04, 1945           MRN: [id number] Visit Date: 01/05/2020              Requested by: Dorothyann Peng, NP [place],  [place] [address] PCP: Dorothyann Peng, NP   Assessment & Plan: Visit Diagnoses:  1. Chronic hip pain after total replacement of left hip joint   2. 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  My recommendation would be a revision of just the acetabular component through a direct anterior approach.  This potentially could just be a polyethylene liner exchange and a hip ball exchange but there is a likelihood of this needing a revision of the acetabulum component as well.  I explained how difficult the surgery will be given the longevity of this being in her body and given her weight.  I described in detail the risk of acute blood loss anemia and nerve vessel injury as well as fracture, infection, DVT and implant failure.  Given her pain she does wish to proceed with surgery.  I still want her to try to lose weight in the interim and I will work to find out what these components are as well.  Anticipate surgery being sometime later in January.  She will need to be in the hospital for a few days as she recovers from the surgery as well.  All questions and concerns were answered and addressed.  Follow-Up Instructions: Return for 2 weeks post-op.   Orders:  Orders Placed This Encounter  Procedures  . XR HIP UNILAT W OR W/O PELVIS 2-3 VIEWS LEFT   No orders of the defined types were placed in this encounter.     Procedures: No procedures performed   Clinical Data: No additional findings.   Subjective: Chief Complaint  Patient presents with  . Left Hip - Pain  The patient is sent to me from  Dr. Louanne Skye to evaluate and treat her left hip.  She has a history of a left total hip arthroplasty done over 30 years ago.  She has now developed polyliner wear and chronic pain without left hip.  She does ambulate with a rolling walker.  She is sent to me to consider hip revision surgery of the acetabular component.  She does report daily hip pain that is definitely affecting her mobility, her quality of life and her actives of daily living.  She is not a diabetic.  She is morbidly obese with a BMI of 40.  HPI  Review of Systems She currently denies any headache, chest pain, shortness of breath, fever, chills, nausea, vomiting  Objective: Vital Signs: Ht 5\' 9"  (1.753 m)   Wt 273 lb 6.4 oz (124 kg)   BMI 40.37 kg/m   Physical Exam She is alert and orient x3 and in no acute distress.  She ambulates slowly with a walker. Ortho Exam Examination of her left hip shows that it is clinically located.  It moves smoothly but is painful but its arc of motion. Specialty Comments:  No specialty comments available.  Imaging: XR HIP UNILAT W OR W/O PELVIS 2-3 VIEWS LEFT  Result Date: 01/05/2020 An AP pelvis and lateral left hip  shows polyliner wear with the femoral head in a higher position within the acetabulum indicative of wear.  The femoral component appears to be well-seated as is the acetabular component but the acetabular component is deeper within the pelvis.    PMFS History: Patient Active Problem List   Diagnosis Date Noted  . Intermediate stage nonexudative age-related macular degeneration of right eye 12/23/2019  . Exudative age-related macular degeneration of left eye with inactive choroidal neovascularization ([place]) 12/23/2019  . Primary open angle glaucoma of both eyes, moderate stage 12/23/2019  . Exudative retinopathy of left eye 12/23/2019  . Routine general medical examination at a health care facility 12/11/2016  . Primary osteoarthritis of left hip 05/01/2016  . Autoimmune  disease ([place]) 11/29/2015  . High risk medication use 11/29/2015  . Vitamin D deficiency 11/29/2015  . Macular degeneration 11/29/2015  . Bilateral lower extremity edema 10/26/2015  . H/O total knee replacement, bilateral 12/09/2013  . Breast mass, left 01/18/2011  . Obesity 12/08/2009  . Venous (peripheral) insufficiency 07/28/2009  . PAIN IN JOINT, ANKLE AND FOOT 01/12/2008  . Obstructive sleep apnea 08/29/2007  . GAIT DISTURBANCE 08/14/2007  . Depression 08/26/2006  . Essential hypertension 08/26/2006  . Primary osteoarthritis of both hands 08/26/2006  . Urinary incontinence 08/26/2006   Past Medical History:  Diagnosis Date  . Anxiety   . Blood transfusion without reported diagnosis   . Breast mass, left   . Cataract   . Depression   . Gait disturbance   . GERD (gastroesophageal reflux disease)   . Hypertension   . Lymphedema of both lower extremities    Seeing OT at [place] to Left Lower leg  . Obesity   . Osteoarthritis   . Rheumatoid arthritis(714.0)   . Sleep apnea    cpap  . Urinary incontinence   . Venous insufficiency     Family History  Problem Relation Age of Onset  . Diabetes Other   . Stroke Other   . Asthma Brother   . Heart disease Brother   . Coronary artery disease Father   . Skin cancer Father   . Heart disease Father   . Coronary artery disease Brother   . Colon cancer Neg Hx   . Esophageal cancer Neg Hx   . Rectal cancer Neg Hx   . Stomach cancer Neg Hx     Past Surgical History:  Procedure Laterality Date  . ABDOMINAL HYSTERECTOMY    . BLADDER SURGERY     sling  . BLADDER SUSPENSION  2009  . BREAST REDUCTION SURGERY    . COLONOSCOPY    . ESOPHAGOGASTRODUODENOSCOPY    . EYE SURGERY    . REPLACEMENT TOTAL KNEE Left   . TOE SURGERY Left    Left great toe  . toenail removal Right    all 5 toenails  . TOTAL HIP ARTHROPLASTY    . TOTAL KNEE ARTHROPLASTY Right 12/09/2013   Procedure: Right Total Knee  Arthroplasty;  Surgeon: Newt Minion, MD;  Location: [facility];  Service: Orthopedics;  Laterality: Right;  Marland Kitchen VESICOVAGINAL FISTULA CLOSURE W/ TAH     Social History   Occupational History  . Not on file  Tobacco Use  . Smoking status: Former Smoker    Packs/day: 0.30    Years: 10.00    Pack years: 3.00    Types: Cigarettes    Quit date: 02/05/1978    Years since quitting: 41.9  . Smokeless tobacco: Never Used  .  Tobacco comment: over 25 years ago...pt doesnt remember when she quit.   Vaping Use  . Vaping Use: Never used  Substance and Sexual Activity  . Alcohol use: No  . Drug use: No  . Sexual activity: Not on file

## 2020-01-05 NOTE — Progress Notes (Signed)
  Numeric Pain Rating Scale and Functional Assessment Average Pain 6   In the last MONTH (on 0-10 scale) has pain interfered with the following?  1. General activity like being  able to carry out your everyday physical activities such as walking, climbing stairs, carrying groceries, or moving a chair?  Rating(9)

## 2020-01-06 ENCOUNTER — Ambulatory Visit: Payer: Medicare HMO | Attending: Specialist

## 2020-01-06 DIAGNOSIS — R2681 Unsteadiness on feet: Secondary | ICD-10-CM | POA: Insufficient documentation

## 2020-01-06 DIAGNOSIS — R2689 Other abnormalities of gait and mobility: Secondary | ICD-10-CM

## 2020-01-06 DIAGNOSIS — M542 Cervicalgia: Secondary | ICD-10-CM | POA: Insufficient documentation

## 2020-01-06 DIAGNOSIS — R262 Difficulty in walking, not elsewhere classified: Secondary | ICD-10-CM

## 2020-01-06 DIAGNOSIS — M25562 Pain in left knee: Secondary | ICD-10-CM | POA: Diagnosis not present

## 2020-01-06 DIAGNOSIS — M6281 Muscle weakness (generalized): Secondary | ICD-10-CM

## 2020-01-06 DIAGNOSIS — R29898 Other symptoms and signs involving the musculoskeletal system: Secondary | ICD-10-CM | POA: Insufficient documentation

## 2020-01-06 DIAGNOSIS — G8929 Other chronic pain: Secondary | ICD-10-CM | POA: Insufficient documentation

## 2020-01-06 DIAGNOSIS — M25561 Pain in right knee: Secondary | ICD-10-CM | POA: Diagnosis not present

## 2020-01-06 NOTE — Therapy (Signed)
[place], [place], [address] Phone: [phone]   Fax:  [phone]  Physical Therapy Treatment  Patient Details  Name: Taylor Mccall MRN: [id number] Date of Birth: 1946/02/05 Referring Provider (PT): Jessy Oto, MD   Encounter Date: 01/06/2020   PT End of Session - 01/06/20 1228    Visit Number 6    Number of Visits 13    Date for PT Re-Evaluation 01/23/20    Authorization Type [employer] MEDICARE HMO/PPO    Authorization Time Period FOTO on 6th and 11th visits    Progress Note Due on Visit 10    PT Start Time 1220    PT Stop Time 1305    PT Time Calculation (min) 45 min    Equipment Utilized During Treatment Other (comment)    Activity Tolerance Patient tolerated treatment well    Behavior During Therapy WFL for tasks assessed/performed           Past Medical History:  Diagnosis Date   Anxiety    Blood transfusion without reported diagnosis    Breast mass, left    Cataract    Depression    Gait disturbance    GERD (gastroesophageal reflux disease)    Hypertension    Lymphedema of both lower extremities    Seeing OT at [facility] to Left Lower leg   Obesity    Osteoarthritis    Rheumatoid arthritis(714.0)    Sleep apnea    cpap   Urinary incontinence    Venous insufficiency     Past Surgical History:  Procedure Laterality Date   ABDOMINAL HYSTERECTOMY     BLADDER SURGERY     sling   BLADDER SUSPENSION  2009   BREAST REDUCTION SURGERY     COLONOSCOPY     ESOPHAGOGASTRODUODENOSCOPY     EYE SURGERY     REPLACEMENT TOTAL KNEE Left    TOE SURGERY Left    Left great toe   toenail removal Right    all 5 toenails   TOTAL HIP ARTHROPLASTY     TOTAL KNEE ARTHROPLASTY Right 12/09/2013   Procedure: Right Total Knee Arthroplasty;  Surgeon: Newt Minion, MD;  Location: [facility];  Service: Orthopedics;  Laterality: Right;    VESICOVAGINAL FISTULA CLOSURE W/ TAH      There were no vitals filed for this visit.   Subjective Assessment - 01/06/20 1332    Subjective Pt reports she hopes to have a l hip revision the first of next year. Pt states her neck is doing well with her not having pain today.    Diagnostic tests 11/12/19: IMPRESSION:1. Multilevel degenerative changes of the cervical spine asdescribed above, with moderate spinal canal stenosis at C3-4 andC5-6.2. Severe bilateral neural foraminal narrowing at C3-4.3. Moderate left neural foraminal narrowing at C7-T1.4. Cystic appearing structure in the left petrous apex, onlypartially evaluated. Correlation brain MRI suggested.    Patient Stated Goals For my neck to hurt less and to walk with better balance    Currently in Pain? No/denies    Pain Score 0-No pain    Pain Location Neck    Pain Orientation Right;Left;Posterior                             [facility] Adult PT Treatment/Exercise - 01/06/20 0001      Knee/Hip Exercises: Stretches   Gastroc Stretch Right;Left;3 reps;20 seconds  Gastroc Stretch Limitations slant board      Knee/Hip Exercises: Standing   Heel Raises Both;15 reps    Heel Raises Limitations toe lifts; 2 sets    Knee Flexion Right;Left;20 reps    Knee Flexion Limitations c hand A    Hip Flexion Right;Left;20 reps    Hip Flexion Limitations c hand A      Knee/Hip Exercises: Seated   Sit to Sand with UE support;10 reps   2 sets                   PT Short Term Goals - 12/25/19 1515      PT SHORT TERM GOAL #1   Title Pt will be Ind c an initial HEP. Achieved    Status Achieved    Target Date 12/25/19      PT SHORT TERM GOAL #2   Title Pt will voice undestanding of measures to reduce and manage neck pain. Achieved    Status Achieved    Target Date 12/25/19             PT Long Term Goals - 01/06/20 1335      PT LONG TERM GOAL #4   Title Improve FOTO functional ability score to 41% Achieved at  60%    Baseline 32% functional ability    Status Achieved    Target Date 01/06/20                 Plan - 01/06/20 1338    Clinical Impression Statement PT was completed to address LE strength and balance for improved safety and functional mobility. FOTO was retaken and pt's scored improved from 32% to 60% reflecting the improved functional mobility the pt is demonstrating. Discussed last appt is scheduled for 12/10 and possible DC at that time. Pt stated she is pleased with her progress and DC then seems appropriate.    Personal Factors and Comorbidities Age;Comorbidity 3+    Comorbidities OA, RA, obesity, depression, anxiety, HTN    Examination-Activity Limitations Locomotion Level;Transfers;Reach Overhead;Caring for Others;Carry;Dressing;Stand;Stairs;Squat;Sleep    Stability/Clinical Decision Making Evolving/Moderate complexity    Clinical Decision Making Moderate    Rehab Potential Good    PT Frequency 2x / week    PT Duration 6 weeks    PT Treatment/Interventions ADLs/Self Care Home Management;Cryotherapy;Electrical Stimulation;Ultrasound;Moist Heat;Iontophoresis 4mg /ml Dexamethasone;Gait training;Stair training;Functional mobility training;Therapeutic activities;Therapeutic exercise;Balance training;Manual techniques;Patient/family education;Passive range of motion;Dry needling;Energy conservation;Taping;Vasopneumatic Device    PT Next Visit Plan Progress strengthening exs and provided care to the neck as indicated. Develop final HEP.    PT Home Exercise Plan [id number]           Patient will benefit from skilled therapeutic intervention in order to improve the following deficits and impairments:  Abnormal gait, Decreased range of motion, Difficulty walking, Decreased endurance, Obesity, Decreased activity tolerance, Pain, Decreased balance, Decreased mobility, Decreased strength, Postural dysfunction  Visit Diagnosis: Muscle weakness (generalized)  Difficulty in walking, not  elsewhere classified  Other abnormalities of gait and mobility  Decreased ROM of neck  Chronic cervical pain  Unsteadiness on feet     Problem List Patient Active Problem List   Diagnosis Date Noted   Intermediate stage nonexudative age-related macular degeneration of right eye 12/23/2019   Exudative age-related macular degeneration of left eye with inactive choroidal neovascularization ([place]) 12/23/2019   Primary open angle glaucoma of both eyes, moderate stage 12/23/2019   Exudative retinopathy of left eye 12/23/2019   Routine general medical examination at  a health care facility 12/11/2016   Primary osteoarthritis of left hip 05/01/2016   Autoimmune disease ([place]) 11/29/2015   High risk medication use 11/29/2015   Vitamin D deficiency 11/29/2015   Macular degeneration 11/29/2015   Bilateral lower extremity edema 10/26/2015   H/O total knee replacement, bilateral 12/09/2013   Breast mass, left 01/18/2011   Obesity 12/08/2009   Venous (peripheral) insufficiency 07/28/2009   PAIN IN JOINT, ANKLE AND FOOT 01/12/2008   Obstructive sleep apnea 08/29/2007   GAIT DISTURBANCE 08/14/2007   Depression 08/26/2006   Essential hypertension 08/26/2006   Primary osteoarthritis of both hands 08/26/2006   Urinary incontinence 08/26/2006    Gar Ponto MS, PT 01/06/20 1:45 PM  [facility] [address] [place], [place], [address] Phone: [phone]   Fax:  [phone]  Name: Taylor Mccall MRN: [id number] Date of Birth: 09-24-1945

## 2020-01-06 NOTE — Progress Notes (Signed)
Taylor Mccall - 74 y.o. female MRN [id number]  Date of birth: Nov 19, 1945  Office Visit Note: Visit Date: 01/05/2020 PCP: Dorothyann Peng, NP Referred by: Dorothyann Peng, NP  Subjective: Chief Complaint  Patient presents with  . Neck - Pain  . Left Shoulder - Pain  . Right Shoulder - Pain  . Right Hand - Numbness  . Left Hand - Numbness   HPI: Taylor Mccall is a 73 y.o. female who comes in today at the request of Dr. Basil Dess for electrodiagnostic study of the Bilateral upper extremities.  Patient is Right hand dominant.  She complains of 6 out of 10 pain in the neck and bilateral shoulders but also with numbness and tingling in both hands.  Numbness and tingling is somewhat nondermatomal and global.  She suffers from rheumatoid arthritis with a lot of hand pain in general.  She does experience some swelling.  Her shoulder pain is worse with movement of the shoulders and seems mechanical.  She has had MRI of the cervical spine and this is reviewed below and does show some level of cervical stenosis in the canal and foraminal he.  She has not had prior electrodiagnostic studies.   ROS Otherwise per HPI.  Assessment & Plan: Visit Diagnoses:    ICD-10-CM   1. Paresthesia of skin  R20.2 NCV with EMG (electromyography)     Plan: Impression: The above electrodiagnostic study is ABNORMAL and reveals evidence of a moderate right median nerve entrapment at the wrist (carpal tunnel syndrome) affecting sensory and motor components.   There is no significant electrodiagnostic evidence of any other focal nerve entrapment, brachial plexopathy or cervical radiculopathy.   Recommendations: 1.  Follow-up with referring physician. 2.  Continue current management of symptoms. 3.  Continue use of resting splint at night-time and as needed during the day. 4.  Suggest surgical evaluation.  Meds & Orders: No orders of the defined types were placed in this encounter.   Orders Placed This  Encounter  Procedures  . NCV with EMG (electromyography)    Follow-up: Return for Basil Dess, MD.   Procedures: No procedures performed  EMG & NCV Findings: Evaluation of the right median motor nerve showed prolonged distal onset latency (4.4 ms), reduced amplitude (4.9 mV), and decreased conduction velocity (Elbow-Wrist, 48 m/s).  The right median (across palm) sensory nerve showed no response (Palm) and prolonged distal peak latency (3.8 ms).  The left ulnar sensory and the right ulnar sensory nerves showed reduced amplitude (L6.3, R9.5 V).  All remaining nerves (as indicated in the following tables) were within normal limits.  Left vs. Right side comparison data for the ulnar motor nerve indicates abnormal L-R amplitude difference (44.4 %).  All remaining left vs. right side differences were within normal limits.    All examined muscles (as indicated in the following table) showed no evidence of electrical instability.    Impression: The above electrodiagnostic study is ABNORMAL and reveals evidence of a moderate right median nerve entrapment at the wrist (carpal tunnel syndrome) affecting sensory and motor components.   There is no significant electrodiagnostic evidence of any other focal nerve entrapment, brachial plexopathy or cervical radiculopathy.   Recommendations: 1.  Follow-up with referring physician. 2.  Continue current management of symptoms. 3.  Continue use of resting splint at night-time and as needed during the day. 4.  Suggest surgical evaluation.  ___________________________ Wonda [employer]s Board Certified, American Board of Physical Medicine and Rehabilitation  Nerve Conduction Studies Anti Sensory Summary Table   Stim Site NR Peak (ms) Norm Peak (ms) P-T Amp (V) Norm P-T Amp Site1 Site2 Delta-P (ms) Dist (cm) Vel (m/s) Norm Vel (m/s)  Left Median Acr Palm Anti Sensory (2nd Digit)  31.2C  Wrist    3.4 <3.6 24.2 >10 Wrist Palm 1.7 0.0    Palm    1.7  <2.0 4.6         Right Median Acr Palm Anti Sensory (2nd Digit)  30.9C  Wrist    *3.8 <3.6 20.2 >10 Wrist Palm  0.0    Palm *NR  <2.0          Left Radial Anti Sensory (Base 1st Digit)  31C  Wrist    2.3 <3.1 16.0  Wrist Base 1st Digit 2.3 0.0    Right Radial Anti Sensory (Base 1st Digit)  30.8C  Wrist    2.1 <3.1 22.1  Wrist Base 1st Digit 2.1 0.0    Left Ulnar Anti Sensory (5th Digit)  31.6C  Wrist    3.1 <3.7 *6.3 >15.0 Wrist 5th Digit 3.1 14.0 45 >38  Right Ulnar Anti Sensory (5th Digit)  31.4C  Wrist    3.5 <3.7 *9.5 >15.0 Wrist 5th Digit 3.5 14.0 40 >38   Motor Summary Table   Stim Site NR Onset (ms) Norm Onset (ms) O-P Amp (mV) Norm O-P Amp Site1 Site2 Delta-0 (ms) Dist (cm) Vel (m/s) Norm Vel (m/s)  Left Median Motor (Abd Poll Brev)  31C  Wrist    4.1 <4.2 5.4 >5 Elbow Wrist 4.8 24.5 51 >50  Elbow    8.9  3.5         Right Median Motor (Abd Poll Brev)  31.2C  Wrist    *4.4 <4.2 *4.9 >5 Elbow Wrist 5.4 26.0 *48 >50  Elbow    9.8  4.4         Left Ulnar Motor (Abd Dig Min)  31C  Wrist    3.4 <4.2 5.0 >3 B Elbow Wrist 3.9 24.0 62 >53  B Elbow    7.3  3.4  A Elbow B Elbow 1.8 11.0 61 >53  A Elbow    9.1  8.2         Right Ulnar Motor (Abd Dig Min)  31.2C  Wrist    3.0 <4.2 9.0 >3 B Elbow Wrist 4.1 24.0 59 >53  B Elbow    7.1  4.0  A Elbow B Elbow 1.9 11.0 58 >53  A Elbow    9.0  8.0          EMG   Side Muscle Nerve Root Ins Act Fibs Psw Amp Dur Poly Recrt Int Fraser Din Comment  Right 1stDorInt Ulnar C8-T1 Nml Nml Nml Nml Nml 0 Nml Nml   Right Abd Poll Brev Median C8-T1 Nml Nml Nml Nml Nml 0 Nml Nml   Right ExtDigCom   Nml Nml Nml Nml Nml 0 Nml Nml   Right Triceps Radial C6-7-8 Nml Nml Nml Nml Nml 0 Nml Nml   Right Deltoid Axillary C5-6 Nml Nml Nml Nml Nml 0 Nml Nml   Left 1stDorInt Ulnar C8-T1 Nml Nml Nml Nml Nml 0 Nml Nml   Left Abd Poll Brev Median C8-T1 Nml Nml Nml Nml Nml 0 Nml Nml   Left ExtDigCom   Nml Nml Nml Nml Nml 0 Nml Nml   Left Triceps Radial C6-7-8 Nml  Nml Nml Nml Nml 0 Nml Nml   Left  Deltoid Axillary C5-6 Nml Nml Nml Nml Nml 0 Nml Nml     Nerve Conduction Studies Anti Sensory Left/Right Comparison   Stim Site L Lat (ms) R Lat (ms) L-R Lat (ms) L Amp (V) R Amp (V) L-R Amp (%) Site1 Site2 L Vel (m/s) R Vel (m/s) L-R Vel (m/s)  Median Acr Palm Anti Sensory (2nd Digit)  31.2C  Wrist 3.4 *3.8 0.4 24.2 20.2 16.5 Wrist Palm     Palm 1.7   4.6         Radial Anti Sensory (Base 1st Digit)  31C  Wrist 2.3 2.1 0.2 16.0 22.1 27.6 Wrist Base 1st Digit     Ulnar Anti Sensory (5th Digit)  31.6C  Wrist 3.1 3.5 0.4 *6.3 *9.5 33.7 Wrist 5th Digit 45 40 5   Motor Left/Right Comparison   Stim Site L Lat (ms) R Lat (ms) L-R Lat (ms) L Amp (mV) R Amp (mV) L-R Amp (%) Site1 Site2 L Vel (m/s) R Vel (m/s) L-R Vel (m/s)  Median Motor (Abd Poll Brev)  31C  Wrist 4.1 *4.4 0.3 5.4 *4.9 9.3 Elbow Wrist 51 *48 3  Elbow 8.9 9.8 0.9 3.5 4.4 20.5       Ulnar Motor (Abd Dig Min)  31C  Wrist 3.4 3.0 0.4 5.0 9.0 *44.4 B Elbow Wrist 62 59 3  B Elbow 7.3 7.1 0.2 3.4 4.0 15.0 A Elbow B Elbow 61 58 3  A Elbow 9.1 9.0 0.1 8.2 8.0 2.4          Waveforms:                      Clinical History: MRI CERVICAL SPINE WITHOUT CONTRAST  TECHNIQUE: Multiplanar, multisequence MR imaging of the cervical spine was performed. No intravenous contrast was administered.  COMPARISON:  Plain films October 21, 2019  FINDINGS: Alignment: Reversal of the cervical curvature.  Vertebrae: No fracture, evidence of discitis, or bone lesion.  Cord: Flattening of the anterior contour of the cord from C2-3 through C5-6. No cord signal abnormality.  Posterior Fossa, vertebral arteries, paraspinal tissues: Cystic appearing structure in the left petrous apex (series 4 and 6, image 12).  Disc levels:  C2-3: Posterior disc protrusion causing mild spinal canal stenosis. No significant neural foraminal narrowing.  C3-4: Posterior disc osteophyte complex  resulting in moderate spinal canal stenosis. Uncovertebral facet degenerative changes resulting in severe bilateral neural narrowing.  C4-5: Posterior disc osteophyte complex resulting in mild spinal canal stenosis. Uncovertebral and facet degenerative changes resulting in mild right neural foraminal narrowing. A perineural cyst is seen on the left.  C5-6: Posterior disc osteophyte complex causing resulting in moderate spinal canal stenosis. Uncovertebral and facet degenerative changes resulting in mild bilateral neural foraminal narrowing.  C6-7: Posterior disc protrusion resulting in mild spinal canal stenosis. Uncovertebral facet degenerative changes resulting in mild.  C7-T1: Facet degenerative changes resulting in moderate left neural foraminal narrowing. No spinal canal stenosis.  IMPRESSION: 1. Multilevel degenerative changes of the cervical spine as described above, with moderate spinal canal stenosis at C3-4 and C5-6. 2. Severe bilateral neural foraminal narrowing at C3-4. 3. Moderate left neural foraminal narrowing at C7-T1. 4. Cystic appearing structure in the left petrous apex, only partially evaluated. Correlation brain MRI suggested.   Electronically Signed   By: Pedro Earls M.D.   On: 11/13/2019 11:40   She reports that she quit smoking about 41 years ago. Her smoking use included cigarettes. She has a  3.00 pack-year smoking history. She has never used smokeless tobacco. No results for input(s): HGBA1C, LABURIC in the last 8760 hours.  Objective:  VS:  HT:    WT:   BMI:     BP:   HR: bpm  TEMP: ( )  RESP:  Physical Exam Musculoskeletal:        General: No swelling, tenderness or deformity.     Comments: Inspection reveals no atrophy of the bilateral APB or FDI or hand intrinsics.  There is some ulnar deviation of the joints and arthritic changes in general.  There is no synovitis noted.  There is no swelling, color changes,  allodynia or dystrophic changes. There is 5 out of 5 strength in the bilateral wrist extension, finger abduction and long finger flexion. There is intact sensation to light touch in all dermatomal and peripheral nerve distributions.  There is a negative Phalen's test bilaterally. There is a negative Hoffmann's test bilaterally.  Skin:    General: Skin is warm and dry.     Findings: No erythema or rash.  Neurological:     General: No focal deficit present.     Mental Status: She is alert and oriented to person, place, and time.     Motor: No weakness or abnormal muscle tone.     Coordination: Coordination normal.  Psychiatric:        Mood and Affect: Mood normal.        Behavior: Behavior normal.     Ortho Exam  Imaging: No results found.  Past Medical/Family/Surgical/Social History: Medications & Allergies reviewed per EMR, new medications updated. Patient Active Problem List   Diagnosis Date Noted  . Intermediate stage nonexudative age-related macular degeneration of right eye 12/23/2019  . Exudative age-related macular degeneration of left eye with inactive choroidal neovascularization ([place]) 12/23/2019  . Primary open angle glaucoma of both eyes, moderate stage 12/23/2019  . Exudative retinopathy of left eye 12/23/2019  . Routine general medical examination at a health care facility 12/11/2016  . Primary osteoarthritis of left hip 05/01/2016  . Autoimmune disease ([place]) 11/29/2015  . High risk medication use 11/29/2015  . Vitamin D deficiency 11/29/2015  . Macular degeneration 11/29/2015  . Bilateral lower extremity edema 10/26/2015  . H/O total knee replacement, bilateral 12/09/2013  . Breast mass, left 01/18/2011  . Obesity 12/08/2009  . Venous (peripheral) insufficiency 07/28/2009  . PAIN IN JOINT, ANKLE AND FOOT 01/12/2008  . Obstructive sleep apnea 08/29/2007  . GAIT DISTURBANCE 08/14/2007  . Depression 08/26/2006  . Essential hypertension 08/26/2006  . Primary  osteoarthritis of both hands 08/26/2006  . Urinary incontinence 08/26/2006   Past Medical History:  Diagnosis Date  . Anxiety   . Blood transfusion without reported diagnosis   . Breast mass, left   . Cataract   . Depression   . Gait disturbance   . GERD (gastroesophageal reflux disease)   . Hypertension   . Lymphedema of both lower extremities    Seeing OT at [place] to Left Lower leg  . Obesity   . Osteoarthritis   . Rheumatoid arthritis(714.0)   . Sleep apnea    cpap  . Urinary incontinence   . Venous insufficiency    Family History  Problem Relation Age of Onset  . Diabetes Other   . Stroke Other   . Asthma Brother   . Heart disease Brother   . Coronary artery disease Father   . Skin cancer Father   . Heart  disease Father   . Coronary artery disease Brother   . Colon cancer Neg Hx   . Esophageal cancer Neg Hx   . Rectal cancer Neg Hx   . Stomach cancer Neg Hx    Past Surgical History:  Procedure Laterality Date  . ABDOMINAL HYSTERECTOMY    . BLADDER SURGERY     sling  . BLADDER SUSPENSION  2009  . BREAST REDUCTION SURGERY    . COLONOSCOPY    . ESOPHAGOGASTRODUODENOSCOPY    . EYE SURGERY    . REPLACEMENT TOTAL KNEE Left   . TOE SURGERY Left    Left great toe  . toenail removal Right    all 5 toenails  . TOTAL HIP ARTHROPLASTY    . TOTAL KNEE ARTHROPLASTY Right 12/09/2013   Procedure: Right Total Knee Arthroplasty;  Surgeon: Newt Minion, MD;  Location: [facility];  Service: Orthopedics;  Laterality: Right;  [place] VESICOVAGINAL FISTULA CLOSURE W/ TAH     Social History   Occupational History  . Not on file  Tobacco Use  . Smoking status: Former Smoker    Packs/day: 0.30    Years: 10.00    Pack years: 3.00    Types: Cigarettes    Quit date: 02/05/1978    Years since quitting: 41.9  . Smokeless tobacco: Never Used  . Tobacco comment: over 25 years ago...pt doesnt remember when she quit.   Vaping Use  . Vaping Use: Never used    Substance and Sexual Activity  . Alcohol use: No  . Drug use: No  . Sexual activity: Not on file

## 2020-01-08 ENCOUNTER — Other Ambulatory Visit: Payer: Self-pay

## 2020-01-08 ENCOUNTER — Ambulatory Visit: Payer: Medicare HMO

## 2020-01-08 DIAGNOSIS — R29898 Other symptoms and signs involving the musculoskeletal system: Secondary | ICD-10-CM

## 2020-01-08 DIAGNOSIS — R262 Difficulty in walking, not elsewhere classified: Secondary | ICD-10-CM | POA: Diagnosis not present

## 2020-01-08 DIAGNOSIS — R2681 Unsteadiness on feet: Secondary | ICD-10-CM | POA: Diagnosis not present

## 2020-01-08 DIAGNOSIS — M25562 Pain in left knee: Secondary | ICD-10-CM | POA: Diagnosis not present

## 2020-01-08 DIAGNOSIS — M542 Cervicalgia: Secondary | ICD-10-CM

## 2020-01-08 DIAGNOSIS — G8929 Other chronic pain: Secondary | ICD-10-CM

## 2020-01-08 DIAGNOSIS — M6281 Muscle weakness (generalized): Secondary | ICD-10-CM

## 2020-01-08 DIAGNOSIS — M25561 Pain in right knee: Secondary | ICD-10-CM | POA: Diagnosis not present

## 2020-01-08 DIAGNOSIS — R2689 Other abnormalities of gait and mobility: Secondary | ICD-10-CM | POA: Diagnosis not present

## 2020-01-08 NOTE — Therapy (Signed)
[place], [place], [address] Phone: [phone]   Fax:  [phone]  Physical Therapy Treatment  Patient Details  Name: Taylor Mccall MRN: [id number] Date of Birth: 09-02-1945 Referring Provider (PT): Jessy Oto, MD   Encounter Date: 01/08/2020   PT End of Session - 01/08/20 1514    Visit Number 7    Number of Visits 13    Date for PT Re-Evaluation 01/23/20    Authorization Type [employer] MEDICARE HMO/PPO    Authorization Time Period FOTO on 6th and 11th visits    Progress Note Due on Visit 10    PT Start Time 1220    PT Stop Time 1300    PT Time Calculation (min) 40 min    Equipment Utilized During Treatment Other (comment)   4wrw   Activity Tolerance Patient tolerated treatment well    Behavior During Therapy [facility] for tasks assessed/performed           Past Medical History:  Diagnosis Date  . Anxiety   . Blood transfusion without reported diagnosis   . Breast mass, left   . Cataract   . Depression   . Gait disturbance   . GERD (gastroesophageal reflux disease)   . Hypertension   . Lymphedema of both lower extremities    Seeing OT at [place] to Left Lower leg  . Obesity   . Osteoarthritis   . Rheumatoid arthritis(714.0)   . Sleep apnea    cpap  . Urinary incontinence   . Venous insufficiency     Past Surgical History:  Procedure Laterality Date  . ABDOMINAL HYSTERECTOMY    . BLADDER SURGERY     sling  . BLADDER SUSPENSION  2009  . BREAST REDUCTION SURGERY    . COLONOSCOPY    . ESOPHAGOGASTRODUODENOSCOPY    . EYE SURGERY    . REPLACEMENT TOTAL KNEE Left   . TOE SURGERY Left    Left great toe  . toenail removal Right    all 5 toenails  . TOTAL HIP ARTHROPLASTY    . TOTAL KNEE ARTHROPLASTY Right 12/09/2013   Procedure: Right Total Knee Arthroplasty;  Surgeon: Newt Minion, MD;  Location: [place];  Service: Orthopedics;  Laterality: Right;  Marland Kitchen  VESICOVAGINAL FISTULA CLOSURE W/ TAH      There were no vitals filed for this visit.   Subjective Assessment - 01/08/20 1230    Subjective Pt reports knee soreness after the last seession, but it has resolved. Pt reports her neck bothers her with Zerita Boers turns.    Diagnostic tests 11/12/19: IMPRESSION:1. Multilevel degenerative changes of the cervical spine asdescribed above, with moderate spinal canal stenosis at C3-4 andC5-6.2. Severe bilateral neural foraminal narrowing at C3-4.3. Moderate left neural foraminal narrowing at C7-T1.4. Cystic appearing structure in the left petrous apex, onlypartially evaluated. Correlation brain MRI suggested.    Patient Stated Goals For my neck to hurt less and to walk with better balance    Currently in Pain? No/denies    Pain Score 3    with quick turns   Pain Location Neck    Pain Orientation Posterior;Left;Right    Pain Descriptors / Indicators Aching    Pain Type Chronic pain    Pain Onset More than a month ago    Pain Frequency Intermittent    Aggravating Factors  quick head turns    Pain Relieving Factors HEP    Effect of Pain on  Daily Activities min to mod impact                             [facility] Adult PT Treatment/Exercise - 01/08/20 0001      Exercises   Exercises Neck;Knee/Hip      Neck Exercises: Seated   Neck Retraction 10 reps    Cervical Rotation Right;Left;5 reps    Cervical Rotation Limitations 3 sec      Knee/Hip Exercises: Seated   Long Arc Quad Right;Left;10 reps    Long Arc Quad Weight 3 lbs.      Manual Therapy   Manual Therapy Soft tissue mobilization;Manual Traction    Soft tissue mobilization To the upper trap and cervial scapulae    Manual Traction Cervical traction and suboccipital release      Neck Exercises: Stretches   Upper Trapezius Stretch Right;Left;2 reps;20 seconds    Levator Stretch Right;Left;2 reps;20 seconds                  PT Education - 01/08/20 1514    Education  Details HEP- new neck, upper shoulder stretches    Person(s) Educated Patient    Methods Explanation;Demonstration;Tactile cues;Verbal cues    Comprehension Verbalized understanding;Returned demonstration;Verbal cues required;Tactile cues required            PT Short Term Goals - 12/25/19 1515      PT SHORT TERM GOAL #1   Title Pt will be Ind c an initial HEP. Achieved    Status Achieved    Target Date 12/25/19      PT SHORT TERM GOAL #2   Title Pt will voice undestanding of measures to reduce and manage neck pain. Achieved    Status Achieved    Target Date 12/25/19             PT Long Term Goals - 01/06/20 1335      PT LONG TERM GOAL #4   Title Improve FOTO functional ability score to 41% Achieved at 60%    Baseline 32% functional ability    Status Achieved    Target Date 01/06/20                 Plan - 01/08/20 1516    Clinical Impression Statement The PT session for today focused on cervical pain and ROM with new stretches, soft tissue mobilization, cervical traction and suboccipital release. Overall, pt reports her neck pain has been much improved.Pt will continue to benefit from PT for LE/trunk strengthening, balance, and cervical exs to reduce pain and improve function.    Personal Factors and Comorbidities Age;Comorbidity 3+    Comorbidities OA, RA, obesity, depression, anxiety, HTN    Examination-Activity Limitations Locomotion Level;Transfers;Reach Overhead;Caring for Others;Carry;Dressing;Stand;Stairs;Squat;Sleep    Stability/Clinical Decision Making Evolving/Moderate complexity    Clinical Decision Making Low    Rehab Potential Good    PT Frequency 2x / week    PT Duration 6 weeks    PT Treatment/Interventions ADLs/Self Care Home Management;Cryotherapy;Electrical Stimulation;Ultrasound;Moist Heat;Iontophoresis 4mg /ml Dexamethasone;Gait training;Stair training;Functional mobility training;Therapeutic activities;Therapeutic exercise;Balance  training;Manual techniques;Patient/family education;Passive range of motion;Dry needling;Energy conservation;Taping;Vasopneumatic Device    PT Next Visit Plan Re-assess functional tests and establish final HEP    PT Home Exercise Plan [id number]- seated upper trap and levator scapulae stretches    Consulted and Agree with Plan of Care Patient           Patient will benefit from skilled therapeutic intervention  in order to improve the following deficits and impairments:  Abnormal gait, Decreased range of motion, Difficulty walking, Decreased endurance, Obesity, Decreased activity tolerance, Pain, Decreased balance, Decreased mobility, Decreased strength, Postural dysfunction  Visit Diagnosis: Muscle weakness (generalized)  Difficulty in walking, not elsewhere classified  Decreased ROM of neck  Chronic cervical pain  Unsteadiness on feet     Problem List Patient Active Problem List   Diagnosis Date Noted  . Intermediate stage nonexudative age-related macular degeneration of right eye 12/23/2019  . Exudative age-related macular degeneration of left eye with inactive choroidal neovascularization ([place]) 12/23/2019  . Primary open angle glaucoma of both eyes, moderate stage 12/23/2019  . Exudative retinopathy of left eye 12/23/2019  . Routine general medical examination at a health care facility 12/11/2016  . Primary osteoarthritis of left hip 05/01/2016  . Autoimmune disease ([place]) 11/29/2015  . High risk medication use 11/29/2015  . Vitamin D deficiency 11/29/2015  . Macular degeneration 11/29/2015  . Bilateral lower extremity edema 10/26/2015  . H/O total knee replacement, bilateral 12/09/2013  . Breast mass, left 01/18/2011  . Obesity 12/08/2009  . Venous (peripheral) insufficiency 07/28/2009  . PAIN IN JOINT, ANKLE AND FOOT 01/12/2008  . Obstructive sleep apnea 08/29/2007  . GAIT DISTURBANCE 08/14/2007  . Depression 08/26/2006  . Essential hypertension 08/26/2006  . Primary  osteoarthritis of both hands 08/26/2006  . Urinary incontinence 08/26/2006   Gar Ponto MS, PT 01/08/20 3:27 PM  [facility] [address] [place], [place], [address] Phone: [phone]   Fax:  [phone]  Name: FELICA CHARGOIS MRN: [id number] Date of Birth: [birth year]

## 2020-01-11 DIAGNOSIS — G4733 Obstructive sleep apnea (adult) (pediatric): Secondary | ICD-10-CM | POA: Diagnosis not present

## 2020-01-11 NOTE — Procedures (Signed)
EMG & NCV Findings: Evaluation of the right median motor nerve showed prolonged distal onset latency (4.4 ms), reduced amplitude (4.9 mV), and decreased conduction velocity (Elbow-Wrist, 48 m/s).  The right median (across palm) sensory nerve showed no response (Palm) and prolonged distal peak latency (3.8 ms).  The left ulnar sensory and the right ulnar sensory nerves showed reduced amplitude (L6.3, R9.5 V).  All remaining nerves (as indicated in the following tables) were within normal limits.  Left vs. Right side comparison data for the ulnar motor nerve indicates abnormal L-R amplitude difference (44.4 %).  All remaining left vs. right side differences were within normal limits.    All examined muscles (as indicated in the following table) showed no evidence of electrical instability.    Impression: The above electrodiagnostic study is ABNORMAL and reveals evidence of a moderate right median nerve entrapment at the wrist (carpal tunnel syndrome) affecting sensory and motor components.   There is no significant electrodiagnostic evidence of any other focal nerve entrapment, brachial plexopathy or cervical radiculopathy.   Recommendations: 1.  Follow-up with referring physician. 2.  Continue current management of symptoms. 3.  Continue use of resting splint at night-time and as needed during the day. 4.  Suggest surgical evaluation.  ___________________________ Laurence Spates FAAPMR Board Certified, American Board of Physical Medicine and Rehabilitation    Nerve Conduction Studies Anti Sensory Summary Table   Stim Site NR Peak (ms) Norm Peak (ms) P-T Amp (V) Norm P-T Amp Site1 Site2 Delta-P (ms) Dist (cm) Vel (m/s) Norm Vel (m/s)  Left Median Acr Palm Anti Sensory (2nd Digit)  31.2C  Wrist    3.4 <3.6 24.2 >10 Wrist Palm 1.7 0.0    Palm    1.7 <2.0 4.6         Right Median Acr Palm Anti Sensory (2nd Digit)  30.9C  Wrist    *3.8 <3.6 20.2 >10 Wrist Palm  0.0    Palm *NR  <2.0            Left Radial Anti Sensory (Base 1st Digit)  31C  Wrist    2.3 <3.1 16.0  Wrist Base 1st Digit 2.3 0.0    Right Radial Anti Sensory (Base 1st Digit)  30.8C  Wrist    2.1 <3.1 22.1  Wrist Base 1st Digit 2.1 0.0    Left Ulnar Anti Sensory (5th Digit)  31.6C  Wrist    3.1 <3.7 *6.3 >15.0 Wrist 5th Digit 3.1 14.0 45 >38  Right Ulnar Anti Sensory (5th Digit)  31.4C  Wrist    3.5 <3.7 *9.5 >15.0 Wrist 5th Digit 3.5 14.0 40 >38   Motor Summary Table   Stim Site NR Onset (ms) Norm Onset (ms) O-P Amp (mV) Norm O-P Amp Site1 Site2 Delta-0 (ms) Dist (cm) Vel (m/s) Norm Vel (m/s)  Left Median Motor (Abd Poll Brev)  31C  Wrist    4.1 <4.2 5.4 >5 Elbow Wrist 4.8 24.5 51 >50  Elbow    8.9  3.5         Right Median Motor (Abd Poll Brev)  31.2C  Wrist    *4.4 <4.2 *4.9 >5 Elbow Wrist 5.4 26.0 *48 >50  Elbow    9.8  4.4         Left Ulnar Motor (Abd Dig Min)  31C  Wrist    3.4 <4.2 5.0 >3 B Elbow Wrist 3.9 24.0 62 >53  B Elbow    7.3  3.4  A Elbow B Elbow  1.8 11.0 61 >53  A Elbow    9.1  8.2         Right Ulnar Motor (Abd Dig Min)  31.2C  Wrist    3.0 <4.2 9.0 >3 B Elbow Wrist 4.1 24.0 59 >53  B Elbow    7.1  4.0  A Elbow B Elbow 1.9 11.0 58 >53  A Elbow    9.0  8.0          EMG   Side Muscle Nerve Root Ins Act Fibs Psw Amp Dur Poly Recrt Int [place] Din Comment  Right 1stDorInt Ulnar C8-T1 Nml Nml Nml Nml Nml 0 Nml Nml   Right Abd Poll Brev Median C8-T1 Nml Nml Nml Nml Nml 0 Nml Nml   Right ExtDigCom   Nml Nml Nml Nml Nml 0 Nml Nml   Right Triceps Radial C6-7-8 Nml Nml Nml Nml Nml 0 Nml Nml   Right Deltoid Axillary C5-6 Nml Nml Nml Nml Nml 0 Nml Nml   Left 1stDorInt Ulnar C8-T1 Nml Nml Nml Nml Nml 0 Nml Nml   Left Abd Poll Brev Median C8-T1 Nml Nml Nml Nml Nml 0 Nml Nml   Left ExtDigCom   Nml Nml Nml Nml Nml 0 Nml Nml   Left Triceps Radial C6-7-8 Nml Nml Nml Nml Nml 0 Nml Nml   Left Deltoid Axillary C5-6 Nml Nml Nml Nml Nml 0 Nml Nml     Nerve Conduction Studies Anti Sensory Left/Right  Comparison   Stim Site L Lat (ms) R Lat (ms) L-R Lat (ms) L Amp (V) R Amp (V) L-R Amp (%) Site1 Site2 L Vel (m/s) R Vel (m/s) L-R Vel (m/s)  Median Acr Palm Anti Sensory (2nd Digit)  31.2C  Wrist 3.4 *3.8 0.4 24.2 20.2 16.5 Wrist Palm     Palm 1.7   4.6         Radial Anti Sensory (Base 1st Digit)  31C  Wrist 2.3 2.1 0.2 16.0 22.1 27.6 Wrist Base 1st Digit     Ulnar Anti Sensory (5th Digit)  31.6C  Wrist 3.1 3.5 0.4 *6.3 *9.5 33.7 Wrist 5th Digit 45 40 5   Motor Left/Right Comparison   Stim Site L Lat (ms) R Lat (ms) L-R Lat (ms) L Amp (mV) R Amp (mV) L-R Amp (%) Site1 Site2 L Vel (m/s) R Vel (m/s) L-R Vel (m/s)  Median Motor (Abd Poll Brev)  31C  Wrist 4.1 *4.4 0.3 5.4 *4.9 9.3 Elbow Wrist 51 *48 3  Elbow 8.9 9.8 0.9 3.5 4.4 20.5       Ulnar Motor (Abd Dig Min)  31C  Wrist 3.4 3.0 0.4 5.0 9.0 *44.4 B Elbow Wrist 62 59 3  B Elbow 7.3 7.1 0.2 3.4 4.0 15.0 A Elbow B Elbow 61 58 3  A Elbow 9.1 9.0 0.1 8.2 8.0 2.4          Waveforms:

## 2020-01-12 ENCOUNTER — Other Ambulatory Visit: Payer: Self-pay

## 2020-01-12 ENCOUNTER — Ambulatory Visit: Payer: Medicare HMO

## 2020-01-12 DIAGNOSIS — R2681 Unsteadiness on feet: Secondary | ICD-10-CM | POA: Diagnosis not present

## 2020-01-12 DIAGNOSIS — M6281 Muscle weakness (generalized): Secondary | ICD-10-CM

## 2020-01-12 DIAGNOSIS — R29898 Other symptoms and signs involving the musculoskeletal system: Secondary | ICD-10-CM | POA: Diagnosis not present

## 2020-01-12 DIAGNOSIS — M25562 Pain in left knee: Secondary | ICD-10-CM

## 2020-01-12 DIAGNOSIS — M25561 Pain in right knee: Secondary | ICD-10-CM | POA: Diagnosis not present

## 2020-01-12 DIAGNOSIS — G8929 Other chronic pain: Secondary | ICD-10-CM

## 2020-01-12 DIAGNOSIS — R262 Difficulty in walking, not elsewhere classified: Secondary | ICD-10-CM | POA: Diagnosis not present

## 2020-01-12 DIAGNOSIS — R2689 Other abnormalities of gait and mobility: Secondary | ICD-10-CM

## 2020-01-12 DIAGNOSIS — M542 Cervicalgia: Secondary | ICD-10-CM | POA: Diagnosis not present

## 2020-01-12 NOTE — Therapy (Signed)
[place], [place], [address] Phone: [phone]   Fax:  [phone]  Physical Therapy Treatment  Patient Details  Name: Taylor Mccall MRN: [id number] Date of Birth: 1945-03-15 Referring Provider (PT): Jessy Oto, MD   Encounter Date: 01/12/2020   PT End of Session - 01/12/20 1227    Visit Number 8    Number of Visits 13    Date for PT Re-Evaluation 01/23/20    Authorization Type AETNA MEDICARE HMO/PPO    Progress Note Due on Visit 10    PT Start Time 1215    PT Stop Time 1300    PT Time Calculation (min) 45 min    Equipment Utilized During Treatment Other (comment)   4wrw   Activity Tolerance Patient tolerated treatment well    Behavior During Therapy [facility] for tasks assessed/performed           Past Medical History:  Diagnosis Date  . Anxiety   . Blood transfusion without reported diagnosis   . Breast mass, left   . Cataract   . Depression   . Gait disturbance   . GERD (gastroesophageal reflux disease)   . Hypertension   . Lymphedema of both lower extremities    Seeing OT at [place] to Left Lower leg  . Obesity   . Osteoarthritis   . Rheumatoid arthritis(714.0)   . Sleep apnea    cpap  . Urinary incontinence   . Venous insufficiency     Past Surgical History:  Procedure Laterality Date  . ABDOMINAL HYSTERECTOMY    . BLADDER SURGERY     sling  . BLADDER SUSPENSION  2009  . BREAST REDUCTION SURGERY    . COLONOSCOPY    . ESOPHAGOGASTRODUODENOSCOPY    . EYE SURGERY    . REPLACEMENT TOTAL KNEE Left   . TOE SURGERY Left    Left great toe  . toenail removal Right    all 5 toenails  . TOTAL HIP ARTHROPLASTY    . TOTAL KNEE ARTHROPLASTY Right 12/09/2013   Procedure: Right Total Knee Arthroplasty;  Surgeon: Newt Minion, MD;  Location: [place];  Service: Orthopedics;  Laterality: Right;  Marland Kitchen VESICOVAGINAL FISTULA CLOSURE W/ TAH      There were no  vitals filed for this visit.   Subjective Assessment - 01/12/20 1330    Subjective Pt reports she fell last friday after a rolling chair slipped out from underneath her and she landed on her bottom. Pt states her neck has been bothering her more, but it is much better.    Diagnostic tests 11/12/19: IMPRESSION:1. Multilevel degenerative changes of the cervical spine asdescribed above, with moderate spinal canal stenosis at C3-4 andC5-6.2. Severe bilateral neural foraminal narrowing at C3-4.3. Moderate left neural foraminal narrowing at C7-T1.4. Cystic appearing structure in the left petrous apex, onlypartially evaluated. Correlation brain MRI suggested.    Patient Stated Goals For my neck to hurt less and to walk with better balance    Currently in Pain? No/denies    Pain Score 3     Pain Location Neck    Pain Orientation Right;Left;Posterior    Pain Descriptors / Indicators Aching    Pain Type Chronic pain    Pain Onset More than a month ago    Pain Frequency Intermittent  [facility] Adult PT Treatment/Exercise - 01/12/20 0001      Exercises   Exercises Neck;Knee/Hip      Knee/Hip Exercises: Seated   Long Arc Quad Right;Left;15 reps    Long Arc Quad Weight 3 lbs.    Other Seated Knee/Hip Exercises Hip add, 15x, 3 sec    Other Seated Knee/Hip Exercises Hip abd, 15x, green Tband    Sit to Sand 10 reps;with UE support   2 sets                 PT Education - 01/12/20 1335    Education Details HEP-Standing exs were added to HEP    Person(s) Educated Patient    Methods Explanation;Demonstration;Tactile cues;Verbal cues;Handout    Comprehension Verbalized understanding;Returned demonstration;Verbal cues required;Tactile cues required            PT Short Term Goals - 12/25/19 1515      PT SHORT TERM GOAL #1   Title Pt will be Ind c an initial HEP. Achieved    Status Achieved    Target Date 12/25/19      PT SHORT TERM GOAL #2    Title Pt will voice undestanding of measures to reduce and manage neck pain. Achieved    Status Achieved    Target Date 12/25/19             PT Long Term Goals - 01/06/20 1335      PT LONG TERM GOAL #4   Title Improve FOTO functional ability score to 41% Achieved at 60%    Baseline 32% functional ability    Status Achieved    Target Date 01/06/20                 Plan - 01/12/20 1335    Clinical Impression Statement Reviewed and completed seated exs for LEs and instructed in and completed standing exs for the LEs/trunk strengthening and balance. Pt completed the standing exs correctly and tolerated them s an adverse effect.    Personal Factors and Comorbidities Age;Comorbidity 3+    Comorbidities OA, RA, obesity, depression, anxiety, HTN    Examination-Activity Limitations Locomotion Level;Transfers;Reach Overhead;Caring for Others;Carry;Dressing;Stand;Stairs;Squat;Sleep    Stability/Clinical Decision Making Evolving/Moderate complexity    Clinical Decision Making Low    Rehab Potential Good    PT Frequency 2x / week    PT Duration 6 weeks    PT Treatment/Interventions ADLs/Self Care Home Management;Cryotherapy;Electrical Stimulation;Ultrasound;Moist Heat;Iontophoresis 4mg /ml Dexamethasone;Gait training;Stair training;Functional mobility training;Therapeutic activities;Therapeutic exercise;Balance training;Manual techniques;Patient/family education;Passive range of motion;Dry needling;Energy conservation;Taping;Vasopneumatic Device    PT Next Visit Plan Re-assess functional tests    PT Home Exercise Plan [id number]- seated upper trap and levator scapulae stretches, standing exs for LE/trunk strengthening and balance    Consulted and Agree with Plan of Care Patient           Patient will benefit from skilled therapeutic intervention in order to improve the following deficits and impairments:  Abnormal gait, Decreased range of motion, Difficulty walking, Decreased endurance,  Obesity, Decreased activity tolerance, Pain, Decreased balance, Decreased mobility, Decreased strength, Postural dysfunction  Visit Diagnosis: Muscle weakness (generalized)  Difficulty in walking, not elsewhere classified  Chronic cervical pain  Decreased ROM of neck  Unsteadiness on feet  Other abnormalities of gait and mobility  Chronic pain of left knee  Chronic pain of right knee     Problem List Patient Active Problem List   Diagnosis Date Noted  . Intermediate stage nonexudative age-related macular degeneration of right  eye 12/23/2019  . Exudative age-related macular degeneration of left eye with inactive choroidal neovascularization ([place]) 12/23/2019  . Primary open angle glaucoma of both eyes, moderate stage 12/23/2019  . Exudative retinopathy of left eye 12/23/2019  . Routine general medical examination at a health care facility 12/11/2016  . Primary osteoarthritis of left hip 05/01/2016  . Autoimmune disease ([place]) 11/29/2015  . High risk medication use 11/29/2015  . Vitamin D deficiency 11/29/2015  . Macular degeneration 11/29/2015  . Bilateral lower extremity edema 10/26/2015  . H/O total knee replacement, bilateral 12/09/2013  . Breast mass, left 01/18/2011  . Obesity 12/08/2009  . Venous (peripheral) insufficiency 07/28/2009  . PAIN IN JOINT, ANKLE AND FOOT 01/12/2008  . Obstructive sleep apnea 08/29/2007  . GAIT DISTURBANCE 08/14/2007  . Depression 08/26/2006  . Essential hypertension 08/26/2006  . Primary osteoarthritis of both hands 08/26/2006  . Urinary incontinence 08/26/2006    Gar Ponto MS, PT 01/12/20 1:44 PM  [place] [facility] [address] [place], [place], [address] Phone: [phone]   Fax:  [phone]  Name: Taylor Mccall MRN: [id number] Date of Birth: 06/04/1945

## 2020-01-13 DIAGNOSIS — G4733 Obstructive sleep apnea (adult) (pediatric): Secondary | ICD-10-CM | POA: Diagnosis not present

## 2020-01-15 ENCOUNTER — Other Ambulatory Visit: Payer: Self-pay

## 2020-01-15 ENCOUNTER — Ambulatory Visit: Payer: Medicare HMO

## 2020-01-15 DIAGNOSIS — R262 Difficulty in walking, not elsewhere classified: Secondary | ICD-10-CM

## 2020-01-15 DIAGNOSIS — G8929 Other chronic pain: Secondary | ICD-10-CM

## 2020-01-15 DIAGNOSIS — M542 Cervicalgia: Secondary | ICD-10-CM | POA: Diagnosis not present

## 2020-01-15 DIAGNOSIS — R2689 Other abnormalities of gait and mobility: Secondary | ICD-10-CM | POA: Diagnosis not present

## 2020-01-15 DIAGNOSIS — R29898 Other symptoms and signs involving the musculoskeletal system: Secondary | ICD-10-CM | POA: Diagnosis not present

## 2020-01-15 DIAGNOSIS — M25562 Pain in left knee: Secondary | ICD-10-CM | POA: Diagnosis not present

## 2020-01-15 DIAGNOSIS — M6281 Muscle weakness (generalized): Secondary | ICD-10-CM | POA: Diagnosis not present

## 2020-01-15 DIAGNOSIS — R2681 Unsteadiness on feet: Secondary | ICD-10-CM | POA: Diagnosis not present

## 2020-01-15 DIAGNOSIS — M25561 Pain in right knee: Secondary | ICD-10-CM | POA: Diagnosis not present

## 2020-01-16 NOTE — Therapy (Signed)
[place], [place], [address] Phone: [phone]   Fax:  [phone]  Physical Therapy Treatment/Discharge Summary  Patient Details  Name: Taylor Mccall MRN: [id number] Date of Birth: February 26, 1945 Referring Provider (PT): Jessy Oto, MD   Encounter Date: 01/15/2020   PT End of Session - 01/16/20 0833    Visit Number 9    Number of Visits 13    Date for PT Re-Evaluation 01/23/20    Authorization Type [employer] MEDICARE HMO/PPO    Authorization Time Period FOTO on 6th and 11th visits    Progress Note Due on Visit 10    PT Start Time 1217    PT Stop Time 1300    PT Time Calculation (min) 43 min    Equipment Utilized During Treatment Other (comment)   4wrw   Activity Tolerance Patient tolerated treatment well    Behavior During Therapy WFL for tasks assessed/performed           Past Medical History:  Diagnosis Date   Anxiety    Blood transfusion without reported diagnosis    Breast mass, left    Cataract    Depression    Gait disturbance    GERD (gastroesophageal reflux disease)    Hypertension    Lymphedema of both lower extremities    Seeing OT at [facility] to Left Lower leg   Obesity    Osteoarthritis    Rheumatoid arthritis(714.0)    Sleep apnea    cpap   Urinary incontinence    Venous insufficiency     Past Surgical History:  Procedure Laterality Date   ABDOMINAL HYSTERECTOMY     BLADDER SURGERY     sling   BLADDER SUSPENSION  2009   BREAST REDUCTION SURGERY     COLONOSCOPY     ESOPHAGOGASTRODUODENOSCOPY     EYE SURGERY     REPLACEMENT TOTAL KNEE Left    TOE SURGERY Left    Left great toe   toenail removal Right    all 5 toenails   TOTAL HIP ARTHROPLASTY     TOTAL KNEE ARTHROPLASTY Right 12/09/2013   Procedure: Right Total Knee Arthroplasty;  Surgeon: Newt Minion, MD;  Location: [facility];  Service: Orthopedics;   Laterality: Right;   VESICOVAGINAL FISTULA CLOSURE W/ TAH      There were no vitals filed for this visit.   Subjective Assessment - 01/15/20 1217    Subjective Pt reports she is doing welll and is pleased with her progress. Pt states she understands her HEP and has no questions re: them.    Diagnostic tests 11/12/19: IMPRESSION:1. Multilevel degenerative changes of the cervical spine asdescribed above, with moderate spinal canal stenosis at C3-4 andC5-6.2. Severe bilateral neural foraminal narrowing at C3-4.3. Moderate left neural foraminal narrowing at C7-T1.4. Cystic appearing structure in the left petrous apex, onlypartially evaluated. Correlation brain MRI suggested.    Patient Stated Goals For my neck to hurt less and to walk with better balance    Currently in Pain? No/denies    Pain Score 2     Pain Location Neck    Pain Orientation Right;Left;Posterior    Pain Descriptors / Indicators Aching    Pain Type Chronic pain    Pain Onset More than a month ago    Pain Frequency Intermittent    Aggravating Factors  quick head turns    Pain Relieving Factors HEP    Effect of Pain on  Daily Activities min impact              [facility] PT Assessment - 01/16/20 0001      AROM   Cervical Flexion 56    Cervical Extension 36    Cervical - Right Side Bend 28    Cervical - Left Side Bend 23    Cervical - Right Rotation 55    Cervical - Left Rotation 52      Standardized Balance Assessment   Standardized Balance Assessment Timed Up and Go Test   10.4 sec c 4wrw     Berg Balance Test   Sit to Stand Able to stand  independently using hands    Standing Unsupported Able to stand safely 2 minutes    Sitting with Back Unsupported but Feet Supported on Floor or Stool Able to sit safely and securely 2 minutes    Stand to Sit Controls descent by using hands    Transfers Able to transfer safely, definite need of hands    Standing Unsupported with Eyes Closed Able to stand 10 seconds safely     Standing Unsupported with Feet Together Able to place feet together independently and stand for 1 minute with supervision    From Standing, Reach Forward with Outstretched Arm Can reach confidently >25 cm (10")    From Standing Position, Pick up Object from Floor Able to pick up shoe safely and easily    From Standing Position, Turn to Look Behind Over each Shoulder Looks behind one side only/other side shows less weight shift    Turn 360 Degrees Able to turn 360 degrees safely but slowly    Standing Unsupported, Alternately Place Feet on Step/Stool Able to complete 4 steps without aid or supervision    Standing Unsupported, One Foot in Front Able to take small step independently and hold 30 seconds    Standing on One Leg Tries to lift leg/unable to hold 3 seconds but remains standing independently    Total Score 42                         [facility] Adult PT Treatment/Exercise - 01/16/20 0001      Ambulation/Gait   Gait Comments 16mwt=435ft      Exercises   Exercises Neck;Knee/Hip      Neck Exercises: Seated   Neck Retraction 10 reps    Cervical Rotation Right;Left;5 reps    Cervical Rotation Limitations 3 sec      Knee/Hip Exercises: Standing   Heel Raises Both;15 reps    Heel Raises Limitations toe lifts; 2 sets    Knee Flexion Right;Left;20 reps    Knee Flexion Limitations c hand A    Hip Flexion Right;Left;20 reps    Hip Flexion Limitations c hand A    Hip Abduction Right;Left;20 reps;Knee straight    Hip Extension Right;Left;20 reps;Knee straight      Knee/Hip Exercises: Seated   Long Arc Quad --    Long Arc [employer] --    Other Seated Knee/Hip Exercises --    Other Seated Knee/Hip Exercises --    Sit to Sand 10 reps;with UE support   2 sets                 PT Education - 01/16/20 0835    Education Details Review of HEP c completion of standing LE exs and seated cervical retraction and rotation    Person(s) Educated Patient    Methods  Explanation;Demonstration    Comprehension Verbalized understanding;Returned demonstration            PT Short Term Goals - 12/25/19 1515      PT SHORT TERM GOAL #1   Title Pt will be Ind c an initial HEP. Achieved    Status Achieved    Target Date 12/25/19      PT SHORT TERM GOAL #2   Title Pt will voice undestanding of measures to reduce and manage neck pain. Achieved    Status Achieved    Target Date 12/25/19             PT Long Term Goals - 01/15/20 1229      PT LONG TERM GOAL #1   Title "Pt will be independent with advanced HEP.     Status Achieved    Target Date 01/15/20      PT LONG TERM GOAL #2   Title Pt will report improved neck pain of 3 or less with daily activities    Status Achieved    Target Date 01/15/20      PT LONG TERM GOAL #3   Title Cervical ROMs will increase by at least 5d for each motion for improved balance, field of vision, and safety. See Flowsheets    Status Achieved    Target Date 01/15/20      PT LONG TERM GOAL #5   Title Increase pt's Berg score to 36/56 as an indication of improved strength, balance, safety and functional mobility. Met 42/56    Baseline 30/56    Status Achieved    Target Date 01/15/20      PT LONG TERM GOAL #6   Title Improve pt's TUG time to less than 11 sec c a RW. Met-10.4 sec c 4wrw    Baseline 13.3 sec c RW    Status Achieved    Target Date 01/15/20      PT LONG TERM GOAL #7   Title Improved 37mwt distance to 459ft as an indication of improved functional mobility. Met-480ft c 4wrw    Baseline 311ft c 4wrw    Status Achieved    Target Date 01/15/20                 Plan - 01/16/20 0836    Clinical Impression Statement Pt demonstrated appropriate technique for standing LE exs and cervical retraction and rotation. Pt has made good progress is all areas with improved cervical pain and improved functional mobility re; tolerance and balance. Pt has met all set PT goals. See below. Pt is in agreement  with PT DC at this time.    Personal Factors and Comorbidities Age;Comorbidity 3+    Comorbidities OA, RA, obesity, depression, anxiety, HTN    Examination-Activity Limitations Locomotion Level;Transfers;Reach Overhead;Caring for Others;Carry;Dressing;Stand;Stairs;Squat;Sleep    Stability/Clinical Decision Making Evolving/Moderate complexity    Clinical Decision Making Low    Rehab Potential Good    PT Frequency 2x / week    PT Duration 6 weeks    PT Treatment/Interventions ADLs/Self Care Home Management;Cryotherapy;Electrical Stimulation;Ultrasound;Moist Heat;Iontophoresis 4mg /ml Dexamethasone;Gait training;Stair training;Functional mobility training;Therapeutic activities;Therapeutic exercise;Balance training;Manual techniques;Patient/family education;Passive range of motion;Dry needling;Energy conservation;Taping;Vasopneumatic Device    PT Home Exercise Plan [id number]- seated upper trap and levator scapulae stretches, standing exs for LE/trunk strengthening and balance    Consulted and Agree with Plan of Care Patient           Patient will benefit from skilled therapeutic intervention in order to improve the following deficits  and impairments:  Abnormal gait,Decreased range of motion,Difficulty walking,Decreased endurance,Obesity,Decreased activity tolerance,Pain,Decreased balance,Decreased mobility,Decreased strength,Postural dysfunction  Visit Diagnosis: Muscle weakness (generalized)  Difficulty in walking, not elsewhere classified  Chronic cervical pain  Decreased ROM of neck  Unsteadiness on feet  Other abnormalities of gait and mobility     Problem List Patient Active Problem List   Diagnosis Date Noted   Intermediate stage nonexudative age-related macular degeneration of right eye 12/23/2019   Exudative age-related macular degeneration of left eye with inactive choroidal neovascularization ([place]) 12/23/2019   Primary open angle glaucoma of both eyes, moderate stage  12/23/2019   Exudative retinopathy of left eye 12/23/2019   Routine general medical examination at a health care facility 12/11/2016   Primary osteoarthritis of left hip 05/01/2016   Autoimmune disease ([facility]) 11/29/2015   High risk medication use 11/29/2015   Vitamin D deficiency 11/29/2015   Macular degeneration 11/29/2015   Bilateral lower extremity edema 10/26/2015   H/O total knee replacement, bilateral 12/09/2013   Breast mass, left 01/18/2011   Obesity 12/08/2009   Venous (peripheral) insufficiency 07/28/2009   PAIN IN JOINT, ANKLE AND FOOT 01/12/2008   Obstructive sleep apnea 08/29/2007   GAIT DISTURBANCE 08/14/2007   Depression 08/26/2006   Essential hypertension 08/26/2006   Primary osteoarthritis of both hands 08/26/2006   Urinary incontinence 08/26/2006   Gar Ponto MS, PT 01/16/20 8:55 AM  PHYSICAL THERAPY DISCHARGE SUMMARY  Visits from Start of Care: 9  Current functional level related to goals / functional outcomes: See above   Remaining deficits: See above   Education / Equipment: HEP  Plan: Patient agrees to discharge.  Patient goals were met. Patient is being discharged due to meeting the stated rehab goals.  ?????       And with pt being pleased with her progress.   [place], [place], [address] Phone: [phone]   Fax:  [phone]  Name: KARNE OZGA MRN: [id number] Date of Birth: [birth year]

## 2020-01-25 ENCOUNTER — Ambulatory Visit: Payer: Medicare HMO | Admitting: Adult Health

## 2020-01-28 ENCOUNTER — Ambulatory Visit (INDEPENDENT_AMBULATORY_CARE_PROVIDER_SITE_OTHER): Payer: Medicare HMO | Admitting: Specialist

## 2020-01-28 ENCOUNTER — Encounter: Payer: Self-pay | Admitting: Specialist

## 2020-01-28 ENCOUNTER — Other Ambulatory Visit: Payer: Self-pay

## 2020-01-28 VITALS — BP 151/82 | HR 90 | Ht 70.0 in | Wt 285.0 lb

## 2020-01-28 DIAGNOSIS — R2689 Other abnormalities of gait and mobility: Secondary | ICD-10-CM | POA: Diagnosis not present

## 2020-01-28 DIAGNOSIS — G8929 Other chronic pain: Secondary | ICD-10-CM | POA: Diagnosis not present

## 2020-01-28 DIAGNOSIS — W19XXXD Unspecified fall, subsequent encounter: Secondary | ICD-10-CM | POA: Diagnosis not present

## 2020-01-28 DIAGNOSIS — M4802 Spinal stenosis, cervical region: Secondary | ICD-10-CM | POA: Diagnosis not present

## 2020-01-28 DIAGNOSIS — M25552 Pain in left hip: Secondary | ICD-10-CM | POA: Diagnosis not present

## 2020-01-28 DIAGNOSIS — Z96653 Presence of artificial knee joint, bilateral: Secondary | ICD-10-CM | POA: Diagnosis not present

## 2020-01-28 DIAGNOSIS — Z96642 Presence of left artificial hip joint: Secondary | ICD-10-CM

## 2020-01-28 NOTE — Progress Notes (Signed)
Office Visit Note   Patient: Taylor Mccall           Date of Birth: March 28, 1945           MRN: [id number] Visit Date: 01/28/2020              Requested by: Dorothyann Peng, NP [place],  [place] [address] PCP: Dorothyann Peng, NP   Assessment & Plan: Visit Diagnoses:  1. Spinal stenosis of cervical region   2. Chronic hip pain after total replacement of left hip joint   3. H/O total knee replacement, bilateral   4. Balance disorder   5. Falls, subsequent encounter     Plan: Avoid overhead lifting and overhead use of the arms. Do not lift greater than 5 lbs. Adjust head rest in vehicle to prevent hyperextension if rear ended. Take extra precautions to avoid falling. Using a walker for ambulation. May contact [facility] to request evaluation of home for access and hand rails and for [employer] and shower    Follow-Up Instructions: Return in about 3 months (around 04/27/2020).   Orders:  No orders of the defined types were placed in this encounter.  No orders of the defined types were placed in this encounter.     Procedures: No procedures performed   Clinical Data: No additional findings.   Subjective: Chief Complaint  Patient presents with  . Neck - Follow-up    States that her neck is much better    HPI  Review of Systems  Constitutional: Negative.   HENT: Negative.   Eyes: Negative.   Respiratory: Negative.   Cardiovascular: Negative.   Gastrointestinal: Negative.   Endocrine: Negative.   Genitourinary: Negative.   Musculoskeletal: Negative.   Skin: Negative.   Allergic/Immunologic: Negative.   Neurological: Negative.   Hematological: Negative.   Psychiatric/Behavioral: Negative.      Objective: Vital Signs: BP (!) 151/82 (BP Location: Left Arm, Patient Position: Sitting)   Pulse 90   Ht 5\' 10"  (1.778 m)   Wt 285 lb (129.3 kg)   BMI 40.89 kg/m   Physical Exam Constitutional:      Appearance:  She is well-developed and well-nourished.  HENT:     Head: Normocephalic and atraumatic.  Eyes:     Extraocular Movements: EOM normal.     Pupils: Pupils are equal, round, and reactive to light.  Pulmonary:     Effort: Pulmonary effort is normal.     Breath sounds: Normal breath sounds.  Abdominal:     General: Bowel sounds are normal.     Palpations: Abdomen is soft.  Musculoskeletal:     Cervical back: Normal range of motion and neck supple.     Lumbar back: Negative right straight leg raise test and negative left straight leg raise test.  Skin:    General: Skin is warm and dry.  Neurological:     Mental Status: She is alert and oriented to person, place, and time.  Psychiatric:        Mood and Affect: Mood and affect normal.        Behavior: Behavior normal.        Thought Content: Thought content normal.        Judgment: Judgment normal.     Back Exam   Tenderness  The patient is experiencing tenderness in the cervical.  Range of Motion  Extension:  40 abnormal  Flexion:  40 abnormal  Lateral bend right: 50  Lateral  bend left: 50  Rotation right: 50  Rotation left: 50   Muscle Strength  Right Quadriceps:  5/5  Left Quadriceps:  5/5  Right Hamstrings:  5/5  Left Hamstrings:  5/5   Tests  Straight leg raise right: negative Straight leg raise left: negative  Reflexes  Patellar: normal Achilles: normal  Other  Toe walk: normal Heel walk: normal Sensation: normal Erythema: no back redness      Specialty Comments:  No specialty comments available.  Imaging: No results found.   PMFS History: Patient Active Problem List   Diagnosis Date Noted  . Intermediate stage nonexudative age-related macular degeneration of right eye 12/23/2019  . Exudative age-related macular degeneration of left eye with inactive choroidal neovascularization ([facility]) 12/23/2019  . Primary open angle glaucoma of both eyes, moderate stage 12/23/2019  . Exudative retinopathy  of left eye 12/23/2019  . Routine general medical examination at a health care facility 12/11/2016  . Primary osteoarthritis of left hip 05/01/2016  . Autoimmune disease ([facility]) 11/29/2015  . High risk medication use 11/29/2015  . Vitamin D deficiency 11/29/2015  . Macular degeneration 11/29/2015  . Bilateral lower extremity edema 10/26/2015  . H/O total knee replacement, bilateral 12/09/2013  . Breast mass, left 01/18/2011  . Obesity 12/08/2009  . Venous (peripheral) insufficiency 07/28/2009  . PAIN IN JOINT, ANKLE AND FOOT 01/12/2008  . Obstructive sleep apnea 08/29/2007  . GAIT DISTURBANCE 08/14/2007  . Depression 08/26/2006  . Essential hypertension 08/26/2006  . Primary osteoarthritis of both hands 08/26/2006  . Urinary incontinence 08/26/2006   Past Medical History:  Diagnosis Date  . Anxiety   . Blood transfusion without reported diagnosis   . Breast mass, left   . Cataract   . Depression   . Gait disturbance   . GERD (gastroesophageal reflux disease)   . Hypertension   . Lymphedema of both lower extremities    Seeing OT at [facility] - dreeeing to Left Lower leg  . Obesity   . Osteoarthritis   . Rheumatoid arthritis(714.0)   . Sleep apnea    cpap  . Urinary incontinence   . Venous insufficiency     Family History  Problem Relation Age of Onset  . Diabetes Other   . Stroke Other   . Asthma Brother   . Heart disease Brother   . Coronary artery disease Father   . Skin cancer Father   . Heart disease Father   . Coronary artery disease Brother   . Colon cancer Neg Hx   . Esophageal cancer Neg Hx   . Rectal cancer Neg Hx   . Stomach cancer Neg Hx     Past Surgical History:  Procedure Laterality Date  . ABDOMINAL HYSTERECTOMY    . BLADDER SURGERY     sling  . BLADDER SUSPENSION  2009  . BREAST REDUCTION SURGERY    . COLONOSCOPY    . ESOPHAGOGASTRODUODENOSCOPY    . EYE SURGERY    . REPLACEMENT TOTAL KNEE Left   . TOE SURGERY Left     Left great toe  . toenail removal Right    all 5 toenails  . TOTAL HIP ARTHROPLASTY    . TOTAL KNEE ARTHROPLASTY Right 12/09/2013   Procedure: Right Total Knee Arthroplasty;  Surgeon: Nadara Mustard, MD;  Location: [facility];  Service: Orthopedics;  Laterality: Right;  Mar[facility] VESICOVAGINAL FISTULA CLOSURE W/ TAH     Social History   Occupational History  . Not on file  Tobacco  Use  . Smoking status: Former Smoker    Packs/day: 0.30    Years: 10.00    Pack years: 3.00    Types: Cigarettes    Quit date: 02/05/1978    Years since quitting: 42.0  . Smokeless tobacco: Never Used  . Tobacco comment: over 25 years ago...pt doesnt remember when she quit.   Vaping Use  . Vaping Use: Never used  Substance and Sexual Activity  . Alcohol use: No  . Drug use: No  . Sexual activity: Not on file

## 2020-01-28 NOTE — Patient Instructions (Signed)
Plan: Avoid overhead lifting and overhead use of the arms. Do not lift greater than 5 lbs. Adjust head rest in vehicle to prevent hyperextension if rear ended. Take extra precautions to avoid falling. Using a walker for ambulation. May contact [facility] to request evaluation of home for access and hand rails and for [employer] and shower

## 2020-02-16 NOTE — Progress Notes (Signed)
Office Visit Note  Patient: Taylor Mccall             Date of Birth: 05-10-1945           MRN: [id number]             PCP: Dorothyann Peng, NP Referring: Dorothyann Peng, NP Visit Date: 03/01/2020 Occupation: @GUAROCC @  Subjective:  Pain in both knees  History of Present Illness: Taylor Mccall is a 75 y.o. female with history of autoimmune disease, DDD, and osteoarthritis.  She is taking plaquenil 200 mg 1 tablet by mouth twice daily.  She is tolerating Plaquenil without any side effects.  She continues to have chronic pain in bilateral knee replacements.  She has been using a Rollator walker which has been helpful.  Patient reports that she was scheduled for a left hip revision by Dr. Ninfa Linden on 03/04/2020 but the surgery was canceled due to increased Covid cases in the hospital.  She states that she also got a lift recliner chair recently and she has been sleeping better at night.  Patient reports that her neck pain and stiffness has improved since going to physical therapy.  She denies any symptoms of radiculopathy.  She has not had any recent rashes, symptoms of Raynaud's, oral or nasal ulcerations, increased photosensitivity, swollen lymph nodes, chest pain, or shortness of breath.  She continues to have chronic sicca symptoms and uses eyedrops for symptomatic relief. She denies any recent infections.   Activities of Daily Living:  Patient reports morning stiffness for all day.  Patient Reports nocturnal pain.  Difficulty dressing/grooming: Reports Difficulty climbing stairs: Reports Difficulty getting out of chair: Reports Difficulty using hands for taps, buttons, cutlery, and/or writing: Reports  Review of Systems  Constitutional: Positive for fatigue.  HENT: Positive for mouth dryness and nose dryness. Negative for mouth sores.   Eyes: Positive for itching. Negative for pain and dryness.  Respiratory: Negative for shortness of breath and difficulty breathing.    Cardiovascular: Negative for chest pain and palpitations.  Gastrointestinal: Negative for blood in stool, constipation and diarrhea.  Endocrine: Negative for increased urination.  Genitourinary: Negative for difficulty urinating.  Musculoskeletal: Positive for arthralgias, joint pain, joint swelling and morning stiffness. Negative for myalgias, muscle tenderness and myalgias.  Skin: Negative for color change, rash and redness.  Allergic/Immunologic: Negative for susceptible to infections.  Neurological: Positive for dizziness, numbness and memory loss. Negative for headaches and weakness.  Hematological: Positive for bruising/bleeding tendency.  Psychiatric/Behavioral: Negative for confusion.    PMFS History:  Patient Active Problem List   Diagnosis Date Noted  . Intermediate stage nonexudative age-related macular degeneration of right eye 12/23/2019  . Exudative age-related macular degeneration of left eye with inactive choroidal neovascularization ([place]) 12/23/2019  . Primary open angle glaucoma of both eyes, moderate stage 12/23/2019  . Exudative retinopathy of left eye 12/23/2019  . Routine general medical examination at a health care facility 12/11/2016  . Primary osteoarthritis of left hip 05/01/2016  . Autoimmune disease ([place]) 11/29/2015  . High risk medication use 11/29/2015  . Vitamin D deficiency 11/29/2015  . Macular degeneration 11/29/2015  . Bilateral lower extremity edema 10/26/2015  . H/O total knee replacement, bilateral 12/09/2013  . Breast mass, left 01/18/2011  . Obesity 12/08/2009  . Venous (peripheral) insufficiency 07/28/2009  . PAIN IN JOINT, ANKLE AND FOOT 01/12/2008  . Obstructive sleep apnea 08/29/2007  . GAIT DISTURBANCE 08/14/2007  . Depression 08/26/2006  . Essential hypertension 08/26/2006  . Primary  osteoarthritis of both hands 08/26/2006  . Urinary incontinence 08/26/2006    Past Medical History:  Diagnosis Date  . Anxiety   . Blood  transfusion without reported diagnosis   . Breast mass, left   . Cataract   . Depression   . Gait disturbance   . GERD (gastroesophageal reflux disease)   . Hypertension   . Lymphedema of both lower extremities    Seeing OT at [place] to Left Lower leg  . Obesity   . Osteoarthritis   . Rheumatoid arthritis(714.0)   . Sleep apnea    cpap  . Urinary incontinence   . Venous insufficiency     Family History  Problem Relation Age of Onset  . Diabetes Other   . Stroke Other   . Asthma Brother   . Heart disease Brother   . Coronary artery disease Father   . Skin cancer Father   . Heart disease Father   . Coronary artery disease Brother   . Colon cancer Neg Hx   . Esophageal cancer Neg Hx   . Rectal cancer Neg Hx   . Stomach cancer Neg Hx    Past Surgical History:  Procedure Laterality Date  . ABDOMINAL HYSTERECTOMY    . BLADDER SURGERY     sling  . BLADDER SUSPENSION  2009  . BREAST REDUCTION SURGERY    . COLONOSCOPY    . ESOPHAGOGASTRODUODENOSCOPY    . EYE SURGERY    . REPLACEMENT TOTAL KNEE Left   . TOE SURGERY Left    Left great toe  . toenail removal Right    all 5 toenails  . TOTAL HIP ARTHROPLASTY    . TOTAL KNEE ARTHROPLASTY Right 12/09/2013   Procedure: Right Total Knee Arthroplasty;  Surgeon: Newt Minion, MD;  Location: [place];  Service: Orthopedics;  Laterality: Right;  Marland Kitchen VESICOVAGINAL FISTULA CLOSURE W/ TAH     Social History   Social History Narrative  . Not on file   Immunization History  Administered Date(s) Administered  . Influenza Split 12/14/2010, 12/06/2012  . Influenza Whole 02/06/2004, 12/08/2009  . Influenza, High Dose Seasonal PF 10/26/2015, 12/11/2016, 01/06/2018, 10/07/2018  . Influenza,inj,Quad PF,6+ Mos 10/15/2013, 11/19/2014  . Influenza-Unspecified 10/07/2018  . PFIZER(Purple Top)SARS-COV-2 Vaccination 03/01/2019, 03/21/2019, 10/06/2019  . Pneumococcal Conjugate-13 03/18/2013  . Pneumococcal  Polysaccharide-23 06/30/2008, 10/26/2015  . Td 02/05/2005  . Tdap 12/14/2010     Objective: Vital Signs: BP 138/85 (BP Location: Left Wrist, Patient Position: Sitting, Cuff Size: Normal)   Pulse 80   Resp 17   Ht $R'5\' 10"'Rx$  (1.778 m)   Wt 273 lb 6.4 oz (124 kg)   BMI 39.23 kg/m    Physical Exam Vitals and nursing note reviewed.  Constitutional:      Appearance: She is well-developed and well-nourished.  HENT:     Head: Normocephalic and atraumatic.  Eyes:     Extraocular Movements: EOM normal.     Conjunctiva/sclera: Conjunctivae normal.  Cardiovascular:     Pulses: Intact distal pulses.  Pulmonary:     Effort: Pulmonary effort is normal.  Abdominal:     Palpations: Abdomen is soft.  Musculoskeletal:     Cervical back: Normal range of motion.  Skin:    General: Skin is warm and dry.     Capillary Refill: Capillary refill takes less than 2 seconds.  Neurological:     Mental Status: She is alert and oriented to person, place, and time.  Psychiatric:  Mood and Affect: Mood and affect normal.        Behavior: Behavior normal.      Musculoskeletal Exam: C-spine limited ROM with lateral rotation.  Postural thoracic kyphosis.  Shoulder joints, elbow joints, wrist joints, MCPs, PIPs, and DIPs good ROM with no synovitis. PIP and DIP thickening consistent with osteoarthritis of both hands.  Complete fist formation bilaterally.  Painful ROM of left hip replacement.  Bilateral knee replacements have good ROM with warmth.  Pedal edema noted bilaterally.  CDAI Exam: CDAI Score: -- Patient Global: --; Provider Global: -- Swollen: --; Tender: -- Joint Exam 03/01/2020   No joint exam has been documented for this visit   There is currently no information documented on the homunculus. Go to the Rheumatology activity and complete the homunculus joint exam.  Investigation: No additional findings.  Imaging: No results found.  Recent Labs: Lab Results  Component Value Date    WBC 5.4 09/29/2019   HGB 12.8 09/29/2019   PLT 268 09/29/2019   NA 139 09/29/2019   K 4.0 09/29/2019   CL 105 09/29/2019   CO2 25 09/29/2019   GLUCOSE 83 09/29/2019   BUN 14 09/29/2019   CREATININE 0.73 09/29/2019   BILITOT 0.7 09/29/2019   ALKPHOS 96 07/25/2019   AST 13 09/29/2019   ALT 7 09/29/2019   PROT 7.1 09/29/2019   ALBUMIN 3.7 07/25/2019   CALCIUM 9.4 09/29/2019   GFRAA 93 09/29/2019    Speciality Comments: PLQ eye exam: 10/20/2019 WNL @ [facility]. Follow up in 6 months.  Procedures:  No procedures performed Allergies: Penicillins           Assessment / Plan:     Visit Diagnoses: Autoimmune disease ([place]) - +ANA, +RNP:  She has not had any signs or symptoms of a systemic autoimmune disease flare recently.  She is clinically doing well taking Plaquenil 200 mg 1 tablet by mouth twice daily.  She is tolerating Plaquenil without any side effects.  She continues to have chronic pain in bilateral knee replacements but has not noticed any other increased joint pain or joint swelling.  She has not had any recent rashes and tries to avoid direct sun exposure.  We discussed the importance of wearing sunscreen SPF 50 on a daily basis.  She has not had any symptoms of Raynaud's and no digital ulcerations or signs of gangrene were noted on exam.  She has not had any recent oral or nasal ulcerations. She has ongoing sicca symptoms, so the use of OTC products were discussed.  She has not had any chest pain, palpitations, or shortness of breath. Lab work from 09/29/19 was reviewed today in the office: dsDNA 1, complements WNL, and ESR WNL. She is due to update autoimmune lab work today.  Orders released. She will continue to take plaquenil as prescribed. A refill of plaquenil and a good rx coupon was provided to the patient.  According to the patient she was notified that PLQ is now tier 4 on her insurance plan, so we will try to look into options for the coverage of future  refills.  She was advised to notify us if she develops signs or symptoms of a flare.  She will follow up in 5 months. - Plan: COMPLETE METABOLIC PANEL WITH GFR, CBC with Differential/Platelet, Urinalysis, Routine w reflex microscopic, Anti-DNA antibody, double-stranded, C3 and C4, Sedimentation rate, hydroxychloroquine (PLAQUENIL) 200 MG tablet  High risk medication use - Plaquenil 200 mg 1 tablet by mouth twice  daily.  CBC and CMP updated on 09/29/19.  She is due to update lab work today. PLQ eye exam: 10/20/2019 WNL @ [facility]. Follow up in 6 months. - Plan: COMPLETE METABOLIC PANEL WITH GFR, CBC with Differential/Platelet She has not had any recent infections.  She has received 3 Pfizer COVID-19 vaccinations.  09/17/18 DEXA, update DEXA in 5 years.   Primary osteoarthritis of both hands: PIP and DIP thickening consistent with osteoarthritis of both hands. Complete fist formation bilaterally.  Joint protection and muscle strengthening were discussed.  DDD (degenerative disc disease), cervical - X-rays of the C-spine were obtained on 09/29/2019 which revealed severe multilevel spondylosis and facet joint arthropathy.  She has limited range of motion with lateral rotation.  Her neck pain and stiffness has improved significantly since going to physical therapy.  She has continued to perform home exercises.  DDD (degenerative disc disease), lumbar - x-rays of the lumbar spine 07/28/2019 which revealed levoscoliosis of the thoracolumbar spine, moderate disc degeneration and severe multilevel facet degenerative.  She experiences intermittent discomfort in her lower back.  She uses a Rollator walker to assist with ambulation.  She recently got a recliner lift chair and has been sleeping better at night.  Prescription for the lift chair was provided today.  Chronic right shoulder pain - She had a right shoulder joint cortisone injection on 09/04/2018 which provided significant pain relief.  She has good  range of motion of the right shoulder joint on exam.  H/O total knee replacement, bilateral: Chronic pain. She uses a rollator walker to assist with ambulation.  She recently got a reclining wheelchair which she has been sleeping in.  She has left difficulty rising from a seated position using the lift chair.  A prescription for the recliner letter was provided to the patient today.   S/P total left hip arthroplasty: Revision canceled for 03/04/20 due to covid-19 cases in the hospital.  She will be rescheduling the surgery with Dr. Ninfa Linden in the future.   Other medical conditions are listed as follows:   History of macular degeneration  Vitamin D deficiency: Vitamin D 44 on 06/15/19.   History of sleep apnea  History of depression  Pedal edema: Unchanged    Orders: Orders Placed This Encounter  Procedures  . COMPLETE METABOLIC PANEL WITH GFR  . CBC with Differential/Platelet  . Urinalysis, Routine w reflex microscopic  . Anti-DNA antibody, double-stranded  . C3 and C4  . Sedimentation rate   Meds ordered this encounter  Medications  . hydroxychloroquine (PLAQUENIL) 200 MG tablet    Sig: Take 1 tablet (200 mg total) by mouth 2 (two) times daily.    Dispense:  180 tablet    Refill:  0     Follow-Up Instructions: Return in about 5 months (around 07/30/2020) for Autoimmune Disease, Osteoarthritis, DDD.   Ofilia Neas, PA-C  Note - This record has been created using Dragon software.  Chart creation errors have been sought, but may not always  have been located. Such creation errors do not reflect on  the standard of medical care.

## 2020-02-25 ENCOUNTER — Encounter (HOSPITAL_COMMUNITY): Admission: RE | Admit: 2020-02-25 | Payer: Medicare HMO | Source: Ambulatory Visit

## 2020-03-01 ENCOUNTER — Telehealth: Payer: Self-pay

## 2020-03-01 ENCOUNTER — Encounter: Payer: Self-pay | Admitting: Physician Assistant

## 2020-03-01 ENCOUNTER — Ambulatory Visit (INDEPENDENT_AMBULATORY_CARE_PROVIDER_SITE_OTHER): Payer: Medicare HMO | Admitting: Physician Assistant

## 2020-03-01 ENCOUNTER — Other Ambulatory Visit: Payer: Self-pay

## 2020-03-01 VITALS — BP 138/85 | HR 80 | Resp 17 | Ht 70.0 in | Wt 273.4 lb

## 2020-03-01 DIAGNOSIS — Z79899 Other long term (current) drug therapy: Secondary | ICD-10-CM

## 2020-03-01 DIAGNOSIS — M359 Systemic involvement of connective tissue, unspecified: Secondary | ICD-10-CM

## 2020-03-01 DIAGNOSIS — Z96642 Presence of left artificial hip joint: Secondary | ICD-10-CM | POA: Diagnosis not present

## 2020-03-01 DIAGNOSIS — Z8659 Personal history of other mental and behavioral disorders: Secondary | ICD-10-CM

## 2020-03-01 DIAGNOSIS — M542 Cervicalgia: Secondary | ICD-10-CM | POA: Diagnosis not present

## 2020-03-01 DIAGNOSIS — R600 Localized edema: Secondary | ICD-10-CM

## 2020-03-01 DIAGNOSIS — M5030 Other cervical disc degeneration, unspecified cervical region: Secondary | ICD-10-CM | POA: Diagnosis not present

## 2020-03-01 DIAGNOSIS — Z8669 Personal history of other diseases of the nervous system and sense organs: Secondary | ICD-10-CM | POA: Diagnosis not present

## 2020-03-01 DIAGNOSIS — M5136 Other intervertebral disc degeneration, lumbar region: Secondary | ICD-10-CM

## 2020-03-01 DIAGNOSIS — M25511 Pain in right shoulder: Secondary | ICD-10-CM | POA: Diagnosis not present

## 2020-03-01 DIAGNOSIS — M19041 Primary osteoarthritis, right hand: Secondary | ICD-10-CM | POA: Diagnosis not present

## 2020-03-01 DIAGNOSIS — M19042 Primary osteoarthritis, left hand: Secondary | ICD-10-CM

## 2020-03-01 DIAGNOSIS — Z96653 Presence of artificial knee joint, bilateral: Secondary | ICD-10-CM | POA: Diagnosis not present

## 2020-03-01 DIAGNOSIS — E559 Vitamin D deficiency, unspecified: Secondary | ICD-10-CM

## 2020-03-01 DIAGNOSIS — M1612 Unilateral primary osteoarthritis, left hip: Secondary | ICD-10-CM

## 2020-03-01 DIAGNOSIS — G8929 Other chronic pain: Secondary | ICD-10-CM

## 2020-03-01 MED ORDER — HYDROXYCHLOROQUINE SULFATE 200 MG PO TABS: 200.0000 mg | ORAL_TABLET | Freq: Two times a day (BID) | ORAL | 0 refills | Status: DC

## 2020-03-01 NOTE — Telephone Encounter (Signed)
Plaquenil is changing tiers within patient's prescription benefits. Taylor Mccall has provided patient with printed prescription and goodrx card at today's visit. Could you please complete a prior authorization for plaquenil, per Hazel Sams, PA-C. Thanks!

## 2020-03-01 NOTE — Telephone Encounter (Signed)
Submitted a Prior Authorization request to [employer] for PLQ via Cover My Meds. Will update once we receive a response.

## 2020-03-01 NOTE — Patient Instructions (Signed)
Biotene products for mouth dryness  

## 2020-03-02 LAB — CBC WITH DIFFERENTIAL/PLATELET
Absolute Monocytes: 339 cells/uL (ref 200–950)
Basophils Absolute: 62 cells/uL (ref 0–200)
Basophils Relative: 1.4 %
Eosinophils Absolute: 220 cells/uL (ref 15–500)
Eosinophils Relative: 5 %
HCT: 39.4 % (ref 35.0–45.0)
Hemoglobin: 13.1 g/dL (ref 11.7–15.5)
Lymphs Abs: 1272 cells/uL (ref 850–3900)
MCH: 29.2 pg (ref 27.0–33.0)
MCHC: 33.2 g/dL (ref 32.0–36.0)
MCV: 87.8 fL (ref 80.0–100.0)
MPV: 11.3 fL (ref 7.5–12.5)
Monocytes Relative: 7.7 %
Neutro Abs: 2508 cells/uL (ref 1500–7800)
Neutrophils Relative %: 57 %
Platelets: 268 10*3/uL (ref 140–400)
RBC: 4.49 10*6/uL (ref 3.80–5.10)
RDW: 11.8 % (ref 11.0–15.0)
Total Lymphocyte: 28.9 %
WBC: 4.4 10*3/uL (ref 3.8–10.8)

## 2020-03-02 LAB — URINALYSIS, ROUTINE W REFLEX MICROSCOPIC
Bilirubin Urine: NEGATIVE
Glucose, UA: NEGATIVE
Hgb urine dipstick: NEGATIVE
Ketones, ur: NEGATIVE
Leukocytes,Ua: NEGATIVE
Nitrite: NEGATIVE
Protein, ur: NEGATIVE
Specific Gravity, Urine: 1.022 (ref 1.001–1.03)
pH: <=5 (ref 5.0–8.0)

## 2020-03-02 LAB — COMPLETE METABOLIC PANEL WITH GFR
AG Ratio: 1.5 (calc) (ref 1.0–2.5)
ALT: 11 U/L (ref 6–29)
AST: 15 U/L (ref 10–35)
Albumin: 4.3 g/dL (ref 3.6–5.1)
Alkaline phosphatase (APISO): 96 U/L (ref 37–153)
BUN: 15 mg/dL (ref 7–25)
CO2: 26 mmol/L (ref 20–32)
Calcium: 10.3 mg/dL (ref 8.6–10.4)
Chloride: 107 mmol/L (ref 98–110)
Creat: 0.79 mg/dL (ref 0.60–0.93)
GFR, Est African American: 84 mL/min/{1.73_m2} (ref >=60)
GFR, Est Non African American: 73 mL/min/{1.73_m2} (ref >=60)
Globulin: 2.8 g/dL (calc) (ref 1.9–3.7)
Glucose, Bld: 87 mg/dL (ref 65–99)
Potassium: 4.5 mmol/L (ref 3.5–5.3)
Sodium: 143 mmol/L (ref 135–146)
Total Bilirubin: 0.8 mg/dL (ref 0.2–1.2)
Total Protein: 7.1 g/dL (ref 6.1–8.1)

## 2020-03-02 LAB — SEDIMENTATION RATE: Sed Rate: 2 mm/h (ref 0–30)

## 2020-03-02 LAB — C3 AND C4
C3 Complement: 131 mg/dL (ref 83–193)
C4 Complement: 34 mg/dL (ref 15–57)

## 2020-03-02 LAB — ANTI-DNA ANTIBODY, DOUBLE-STRANDED: ds DNA Ab: 1 IU/mL

## 2020-03-02 NOTE — Progress Notes (Signed)
CBC and CMP WNL.   UA normal.  ESR WNL.

## 2020-03-02 NOTE — Progress Notes (Signed)
dsDNA negative.  Labs are not consistent with a flare.  No change in therapy at this time.

## 2020-03-02 NOTE — Progress Notes (Signed)
Complements WNL

## 2020-03-04 ENCOUNTER — Inpatient Hospital Stay: Admit: 2020-03-04 | Payer: Medicare HMO | Admitting: Orthopaedic Surgery

## 2020-03-04 SURGERY — REVISION, TOTAL ARTHROPLASTY, HIP, ANTERIOR APPROACH
Anesthesia: Choice | Site: Hip | Laterality: Left

## 2020-03-04 NOTE — Telephone Encounter (Signed)
Received notification from [facility] regarding a prior authorization for PLQ. Authorization has been APPROVED from 02/06/2020 to 02/04/2021.   Phone # [phone]

## 2020-03-13 DIAGNOSIS — G4733 Obstructive sleep apnea (adult) (pediatric): Secondary | ICD-10-CM | POA: Diagnosis not present

## 2020-03-15 DIAGNOSIS — G4733 Obstructive sleep apnea (adult) (pediatric): Secondary | ICD-10-CM | POA: Diagnosis not present

## 2020-03-16 ENCOUNTER — Encounter: Payer: Self-pay | Admitting: Adult Health

## 2020-03-16 DIAGNOSIS — Z1231 Encounter for screening mammogram for malignant neoplasm of breast: Secondary | ICD-10-CM | POA: Diagnosis not present

## 2020-03-17 ENCOUNTER — Inpatient Hospital Stay: Payer: Medicare HMO | Admitting: Orthopaedic Surgery

## 2020-03-22 ENCOUNTER — Other Ambulatory Visit: Payer: Self-pay

## 2020-03-24 NOTE — Progress Notes (Signed)
Pt. Needs orders for upcoming surgery. PAT and labs on 03/28/20.Thanks.

## 2020-03-24 NOTE — Patient Instructions (Addendum)
DUE TO COVID-19 ONLY ONE VISITOR IS ALLOWED TO COME WITH YOU AND STAY IN THE WAITING ROOM ONLY DURING PRE OP AND PROCEDURE DAY OF SURGERY. THE 1 VISITOR  MAY VISIT WITH YOU AFTER SURGERY IN YOUR PRIVATE ROOM DURING VISITING HOURS ONLY!  YOU NEED TO HAVE A COVID 19 TEST ON: 03/29/20 @ 1:00 PM, THIS TEST MUST BE DONE BEFORE SURGERY,  COVID TESTING SITE [place] [id number], IT IS ON THE RIGHT GOING OUT [address] APPROXIMATELY  2 MINUTES PAST [employer] ON THE RIGHT. ONCE YOUR COVID TEST IS COMPLETED,  PLEASE BEGIN THE QUARANTINE INSTRUCTIONS AS OUTLINED IN YOUR HANDOUT.                Taylor Mccall    Your procedure is scheduled on:04/01/20    Report to [facility] Main  Entrance   Report to admitting at : 9:45 AM     Call this number if you have problems the morning of surgery [phone]    Remember:   NO SOLID FOOD AFTER MIDNIGHT THE NIGHT PRIOR TO SURGERY. NOTHING BY MOUTH EXCEPT CLEAR LIQUIDS UNTIL: 9:15 AM . PLEASE FINISH ENSURE DRINK PER SURGEON ORDER  WHICH NEEDS TO BE COMPLETED AT : 9:15 AM.  CLEAR LIQUID DIET  Foods Allowed                                                                     Foods Excluded  Coffee and tea, regular and decaf                             liquids that you cannot  Plain Jell-O any favor except red or purple                                           see through such as: Fruit ices (not with fruit pulp)                                     milk, soups, orange juice  Iced Popsicles                                    All solid food Carbonated beverages, regular and diet                                    Cranberry, grape and apple juices Sports drinks like Gatorade Lightly seasoned clear broth or consume(fat free) Sugar, honey syrup  Sample Menu Breakfast                                Lunch  Supper Cranberry juice                    Beef broth                             Chicken broth Jell-O                                     Grape juice                           Apple juice Coffee or tea                        Jell-O                                      Popsicle                                                Coffee or tea                        Coffee or tea  _____________________________________________________________________   BRUSH YOUR TEETH MORNING OF SURGERY AND RINSE YOUR MOUTH OUT, NO CHEWING GUM CANDY OR MINTS.   Take these medicines the morning of surgery with A SIP OF WATER: plaquenil,pantoprazole,sertraline.Use eye drops as usual.                     You may not have any metal on your body including hair pins and              piercings  Do not wear jewelry, make-up, lotions, powders or perfumes, deodorant             Do not wear nail polish on your fingernails.  Do not shave  48 hours prior to surgery.    Do not bring valuables to the hospital. [place].  Contacts, dentures or bridgework may not be worn into surgery.  Leave suitcase in the car. After surgery it may be brought to your room.     Patients discharged the day of surgery will not be allowed to drive home. IF YOU ARE HAVING SURGERY AND GOING HOME THE SAME DAY, YOU MUST HAVE AN ADULT TO DRIVE YOU HOME AND BE WITH YOU FOR 24 HOURS. YOU MAY GO HOME BY TAXI OR UBER OR ORTHERWISE, BUT AN ADULT MUST ACCOMPANY YOU HOME AND STAY WITH YOU FOR 24 HOURS.  Name and phone number of your driver:  Special Instructions: N/A              Please read over the following fact sheets you were given: _____________________________________________________________________    PLEASE BRING CPAP MASK [place]. DEVICE WILL BE PROVIDED!     [place] - Preparing for Surgery Before surgery, you can play an important role.  Because skin is not sterile, your skin needs to be as free of germs as  possible.  You can reduce the number of germs on  your skin by washing with CHG (chlorahexidine gluconate) soap before surgery.  CHG is an antiseptic cleaner which kills germs and bonds with the skin to continue killing germs even after washing. Please DO NOT use if you have an allergy to CHG or antibacterial soaps.  If your skin becomes reddened/irritated stop using the CHG and inform your nurse when you arrive at Short Stay. Do not shave (including legs and underarms) for at least 48 hours prior to the first CHG shower.  You may shave your face/neck. Please follow these instructions carefully:  1.  Shower with CHG Soap the night before surgery and the  morning of Surgery.  2.  If you choose to wash your hair, wash your hair first as usual with your  normal  shampoo.  3.  After you shampoo, rinse your hair and body thoroughly to remove the  shampoo.                           4.  Use CHG as you would any other liquid soap.  You can apply chg directly  to the skin and wash                       Gently with a scrungie or clean washcloth.  5.  Apply the CHG Soap to your body ONLY FROM THE NECK DOWN.   Do not use on face/ open                           Wound or open sores. Avoid contact with eyes, ears mouth and genitals (private parts).                       Wash face,  Genitals (private parts) with your normal soap.             6.  Wash thoroughly, paying special attention to the area where your surgery  will be performed.  7.  Thoroughly rinse your body with warm water from the neck down.  8.  DO NOT shower/wash with your normal soap after using and rinsing off  the CHG Soap.                9.  Pat yourself dry with a clean towel.            10.  Wear clean pajamas.            11.  Place clean sheets on your bed the night of your first shower and do not  sleep with pets. Day of Surgery : Do not apply any lotions/deodorants the morning of surgery.  Please wear clean clothes to the hospital/surgery center.  FAILURE TO FOLLOW THESE INSTRUCTIONS MAY  RESULT IN THE CANCELLATION OF YOUR SURGERY PATIENT SIGNATURE_________________________________  NURSE SIGNATURE__________________________________  ________________________________________________________________________

## 2020-03-25 ENCOUNTER — Other Ambulatory Visit: Payer: Self-pay | Admitting: Physician Assistant

## 2020-03-28 ENCOUNTER — Other Ambulatory Visit: Payer: Self-pay

## 2020-03-28 ENCOUNTER — Encounter (HOSPITAL_COMMUNITY)
Admission: RE | Admit: 2020-03-28 | Discharge: 2020-03-28 | Disposition: A | Discharge status: Home / Self Care | Payer: Medicare HMO | Source: Ambulatory Visit | Attending: Orthopaedic Surgery | Admitting: Orthopaedic Surgery

## 2020-03-28 ENCOUNTER — Encounter (HOSPITAL_COMMUNITY): Payer: Self-pay

## 2020-03-28 ENCOUNTER — Telehealth: Payer: Self-pay

## 2020-03-28 DIAGNOSIS — Z01818 Encounter for other preprocedural examination: Secondary | ICD-10-CM | POA: Insufficient documentation

## 2020-03-28 LAB — CBC
HCT: 37.3 % (ref 36.0–46.0)
Hemoglobin: 12.2 g/dL (ref 12.0–15.0)
MCH: 29.2 pg (ref 26.0–34.0)
MCHC: 32.7 g/dL (ref 30.0–36.0)
MCV: 89.2 fL (ref 80.0–100.0)
Platelets: 250 10*3/uL (ref 150–400)
RBC: 4.18 MIL/uL (ref 3.87–5.11)
RDW: 12.5 % (ref 11.5–15.5)
WBC: 6.7 10*3/uL (ref 4.0–10.5)
nRBC: 0 % (ref 0.0–0.2)

## 2020-03-28 LAB — BASIC METABOLIC PANEL
Anion gap: 11 (ref 5–15)
BUN: 12 mg/dL (ref 8–23)
CO2: 24 mmol/L (ref 22–32)
Calcium: 9.6 mg/dL (ref 8.9–10.3)
Chloride: 107 mmol/L (ref 98–111)
Creatinine, Ser: 0.83 mg/dL (ref 0.44–1.00)
GFR, Estimated: >60 mL/min (ref >60)
Glucose, Bld: 91 mg/dL (ref 70–99)
Potassium: 3.5 mmol/L (ref 3.5–5.1)
Sodium: 142 mmol/L (ref 135–145)

## 2020-03-28 LAB — SURGICAL PCR SCREEN
MRSA, PCR: NEGATIVE
Staphylococcus aureus: NEGATIVE

## 2020-03-28 NOTE — Telephone Encounter (Signed)
Please advise 

## 2020-03-28 NOTE — Telephone Encounter (Signed)
Ester from the blood bank at [facility] long called she stated that patient has a history of multiple antibodies she needs to know how many red cells to have on hand for patients surgery on Friday call [phone]

## 2020-03-28 NOTE — Progress Notes (Signed)
COVID Vaccine Completed: Yes Date COVID Vaccine completed: 10/06/19 [place] COVID vaccine manufacturer: [place]      PCP - Dorothyann Peng: NP Cardiologist -   Chest x-ray -  EKG -  Stress Test -  ECHO -  Cardiac Cath -  Pacemaker/ICD device last checked:  Sleep Study - Yes CPAP - Yes  Fasting Blood Sugar -  Checks Blood Sugar _____ times a day  Blood Thinner Instructions: Aspirin Instructions: Last Dose:  Anesthesia review: Hx: HTN,OSA(CPAP)  Patient denies shortness of breath, fever, cough and chest pain at PAT appointment   Patient verbalized understanding of instructions that were given to them at the PAT appointment. Patient was also instructed that they will need to review over the PAT instructions again at home before surgery.

## 2020-03-29 ENCOUNTER — Other Ambulatory Visit (HOSPITAL_COMMUNITY)
Admission: RE | Admit: 2020-03-29 | Discharge: 2020-03-29 | Disposition: A | Discharge status: Home / Self Care | Payer: Medicare HMO | Source: Ambulatory Visit | Attending: Orthopaedic Surgery | Admitting: Orthopaedic Surgery

## 2020-03-29 DIAGNOSIS — Z01812 Encounter for preprocedural laboratory examination: Secondary | ICD-10-CM | POA: Insufficient documentation

## 2020-03-29 DIAGNOSIS — Z20822 Contact with and (suspected) exposure to covid-19: Secondary | ICD-10-CM | POA: Insufficient documentation

## 2020-03-29 LAB — SARS CORONAVIRUS 2 (TAT 6-24 HRS): SARS Coronavirus 2: NEGATIVE

## 2020-03-29 NOTE — Telephone Encounter (Signed)
One unit will be enough

## 2020-03-31 DIAGNOSIS — Z96649 Presence of unspecified artificial hip joint: Secondary | ICD-10-CM

## 2020-03-31 DIAGNOSIS — T84068A Wear of articular bearing surface of other internal prosthetic joint, initial encounter: Secondary | ICD-10-CM

## 2020-03-31 NOTE — H&P (Signed)
TOTAL HIP REVISION ADMISSION H&P  Patient is admitted for left revision total hip arthroplasty.  Subjective:  Chief Complaint: left hip pain  HPI: Taylor Mccall, 75 y.o. female, has a history of pain and functional disability in the left hip due to poly liner wear and patient has failed non-surgical conservative treatments for greater than 12 weeks to include NSAID's and/or analgesics, flexibility and strengthening excercises, use of assistive devices, weight reduction as appropriate and activity modification. The indications for the revision total hip arthroplasty are bearing surface wear leading to  symptomatic synovitis.  Onset of symptoms was gradual starting 2 years ago with gradually worsening course since that time.  Prior procedures on the left hip include arthroplasty.  Patient currently rates pain in the left hip at 8 out of 10 with activity.  There is worsening of pain with activity and weight bearing and pain that interfers with activities of daily living. Patient has evidence of poly liner wear by imaging studies.  This condition presents safety issues increasing the risk of falls.  There is no current active infection.  Patient Active Problem List   Diagnosis Date Noted  . 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 Intermediate stage nonexudative age-related macular degeneration of right eye 12/23/2019  . Exudative age-related macular degeneration of left eye with inactive choroidal neovascularization ([place]) 12/23/2019  . Primary open angle glaucoma of both eyes, moderate stage 12/23/2019  . Exudative retinopathy of left eye 12/23/2019  . Routine general medical examination at a health care facility 12/11/2016  . Primary osteoarthritis of left hip 05/01/2016  . Autoimmune disease ([place]) 11/29/2015  . High risk medication use 11/29/2015  . Vitamin D deficiency 11/29/2015  . Macular degeneration  11/29/2015  . Bilateral lower extremity edema 10/26/2015  . H/O total knee replacement, bilateral 12/09/2013  . Breast mass, left 01/18/2011  . Obesity 12/08/2009  . Venous (peripheral) insufficiency 07/28/2009  . PAIN IN JOINT, ANKLE AND FOOT 01/12/2008  . Obstructive sleep apnea 08/29/2007  . GAIT DISTURBANCE 08/14/2007  . Depression 08/26/2006  . Essential hypertension 08/26/2006  . Primary osteoarthritis of both hands 08/26/2006  . Urinary incontinence 08/26/2006   Past Medical History:  Diagnosis Date  . Anxiety   . Blood transfusion without reported diagnosis   . Breast mass, left   . Cataract   . Depression   . Gait disturbance   . GERD (gastroesophageal reflux disease)   . Hypertension   . Lymphedema of both lower extremities    Seeing OT at [facility] to Left Lower leg  . Obesity   . Osteoarthritis   . Rheumatoid arthritis(714.0)   . Sleep apnea    cpap  . Urinary incontinence   . Venous insufficiency     Past Surgical History:  Procedure Laterality Date  . ABDOMINAL HYSTERECTOMY    . BLADDER SURGERY     sling  . BLADDER SUSPENSION  2009  . BREAST REDUCTION SURGERY    . COLONOSCOPY    . ESOPHAGOGASTRODUODENOSCOPY    . EYE SURGERY    . REPLACEMENT TOTAL KNEE Left   . TOE SURGERY Left    Left great toe  . toenail removal Right    all 5 toenails  . TOTAL HIP ARTHROPLASTY    . TOTAL KNEE ARTHROPLASTY Right 12/09/2013   Procedure: Right Total Knee Arthroplasty;  Surgeon: Newt Minion, MD;  Location: [facility];  Service: Orthopedics;  Laterality:  Right;  Marland Kitchen VESICOVAGINAL FISTULA CLOSURE W/ TAH      No current facility-administered medications for this encounter.   Current Outpatient Medications  Medication Sig Dispense Refill Last Dose  . ALEVE 220 MG CAPS Take 220-440 mg by mouth 2 (two) times daily as needed (pain.).     Marland Kitchen aspirin 81 MG tablet Take 81 mg by mouth daily.     . Biotin w/ Vitamins C & E (HAIR SKIN & NAILS GUMMIES PO)  Take 2 tablets by mouth daily.     . carboxymethylcellulose (REFRESH PLUS) 0.5 % SOLN Place 2 drops into the right eye 4 (four) times daily as needed (for itchy eyes).     . cholecalciferol (VITAMIN D) 25 MCG (1000 UNIT) tablet Take 1,000 Units by mouth daily.     . Cyanocobalamin (VITAMIN B-12 PO) Take 2 tablets by mouth daily. (Gummy)     . furosemide (LASIX) 20 MG tablet TAKE 1 TABLET (20 MG TOTAL) BY MOUTH 2 (TWO) TIMES DAILY. **DUE FOR YEARLY PHYSICAL** (Patient taking differently: Take 20 mg by mouth 2 (two) times daily. Morning & Midday) 180 tablet 0   . hydroxychloroquine (PLAQUENIL) 200 MG tablet Take 1 tablet (200 mg total) by mouth 2 (two) times daily. 180 tablet 0   . losartan (COZAAR) 25 MG tablet Take 1 tablet (25 mg total) by mouth daily. 90 tablet 3   . Multiple Vitamins-Minerals (ADULT GUMMY PO) Take 2 tablets by mouth daily. VitaFusion Multivitamins for Women (Gummy)     . Multiple Vitamins-Minerals (PRESERVISION AREDS 2 PO) Take 1 tablet by mouth in the morning and at bedtime.     . NON FORMULARY 1 each by Other route See admin instructions. Use CPAP nightly.     . Omega-3 Fatty Acids (FISH OIL ADULT GUMMIES PO) Take 2 tablets by mouth daily. (Gummy)     . pantoprazole (PROTONIX) 40 MG tablet TAKE ONE TABLET BY MOUTH ONCE DAILY BEFORE BREAKFAST (Patient taking differently: Take 40 mg by mouth daily before breakfast. TAKE ONE TABLET BY MOUTH ONCE DAILY BEFORE BREAKFAST) 90 tablet 3   . potassium chloride SA (KLOR-CON) 20 MEQ tablet Take 2 tablets (40 mEq total) by mouth every morning. (Patient taking differently: Take 40 mEq by mouth daily.) 180 tablet 3   . sertraline (ZOLOFT) 100 MG tablet Take 100 mg by mouth daily.     . Travoprost, BAK Free, (TRAVATAN) 0.004 % SOLN ophthalmic solution Place 1 drop into both eyes at bedtime.     . vitamin C (ASCORBIC ACID) 250 MG tablet Take 500 mg by mouth daily. (Gummy)     . vitamin E 180 MG (400 UNITS) capsule Take 400 Units by mouth daily.       Allergies  Allergen Reactions  . Penicillins Rash    Social History   Tobacco Use  . Smoking status: Former Smoker    Packs/day: 0.30    Years: 10.00    Pack years: 3.00    Types: Cigarettes    Quit date: 02/05/1978    Years since quitting: 42.1  . Smokeless tobacco: Never Used  . Tobacco comment: over 25 years ago...pt doesnt remember when she quit.   Substance Use Topics  . Alcohol use: No    Family History  Problem Relation Age of Onset  . Diabetes Other   . Stroke Other   . Asthma Brother   . Heart disease Brother   . Coronary artery disease Father   . Skin cancer Father   .  Heart disease Father   . Coronary artery disease Brother   . Colon cancer Neg Hx   . Esophageal cancer Neg Hx   . Rectal cancer Neg Hx   . Stomach cancer Neg Hx       Review of Systems  Objective:  Physical Exam Vitals reviewed.  Constitutional:      Appearance: Normal appearance.  HENT:     Head: Normocephalic and atraumatic.  Cardiovascular:     Rate and Rhythm: Normal rate.  Pulmonary:     Effort: Pulmonary effort is normal.  Abdominal:     Palpations: Abdomen is soft.  Musculoskeletal:     Cervical back: Normal range of motion.     Left hip: Tenderness and bony tenderness present. Decreased range of motion. Decreased strength.  Neurological:     Mental Status: She is alert and oriented to person, place, and time.  Psychiatric:        Behavior: Behavior normal.     Vital signs in last 24 hours:     Labs:   Estimated body mass index is 39.23 kg/m as calculated from the following:   Height as of 03/28/20: 5\' 10"  (1.778 m).   Weight as of 03/01/20: 124 kg.  Imaging Review:  Plain radiographs demonstrate polyethylene liner wear of the left hip with superior migration of the femoral head within the acetabular component.   Assessment/Plan: Left hip pain with a history of a total hip arthroplasty showing significant polyethylene liner wear.  The patient  understands that we are going to proceed to surgery for at a minimum a left hip polyliner wear exchange and hip ball exchange.  We will address any loosening of the acetabular component intraoperatively as indicated.  We had a long and thorough discussion about the risk and benefits of the surgery including the risks of dislocation, DVT, acute blood loss anemia, and infection.  She understands this case will be difficult due to her morbid obesity.

## 2020-04-01 ENCOUNTER — Inpatient Hospital Stay (HOSPITAL_COMMUNITY): Payer: Medicare HMO

## 2020-04-01 ENCOUNTER — Other Ambulatory Visit: Payer: Self-pay

## 2020-04-01 ENCOUNTER — Inpatient Hospital Stay (HOSPITAL_COMMUNITY)
Admission: RE | Admit: 2020-04-01 | Discharge: 2020-04-02 | DRG: 467 | Disposition: A | Discharge status: Home Health | Payer: Medicare HMO | Source: Ambulatory Visit | Attending: Orthopaedic Surgery | Admitting: Orthopaedic Surgery

## 2020-04-01 ENCOUNTER — Encounter (HOSPITAL_COMMUNITY): Payer: Self-pay | Admitting: Orthopaedic Surgery

## 2020-04-01 ENCOUNTER — Inpatient Hospital Stay (HOSPITAL_COMMUNITY): Payer: Medicare HMO | Admitting: Anesthesiology

## 2020-04-01 ENCOUNTER — Encounter (HOSPITAL_COMMUNITY)
Admission: RE | Disposition: A | Discharge status: Home Health | Payer: Self-pay | Source: Ambulatory Visit | Attending: Orthopaedic Surgery

## 2020-04-01 DIAGNOSIS — Z20822 Contact with and (suspected) exposure to covid-19: Secondary | ICD-10-CM | POA: Diagnosis present

## 2020-04-01 DIAGNOSIS — Z87891 Personal history of nicotine dependence: Secondary | ICD-10-CM | POA: Diagnosis not present

## 2020-04-01 DIAGNOSIS — Y792 Prosthetic and other implants, materials and accessory orthopedic devices associated with adverse incidents: Secondary | ICD-10-CM | POA: Diagnosis present

## 2020-04-01 DIAGNOSIS — M069 Rheumatoid arthritis, unspecified: Secondary | ICD-10-CM | POA: Diagnosis present

## 2020-04-01 DIAGNOSIS — T84061A Wear of articular bearing surface of internal prosthetic left hip joint, initial encounter: Secondary | ICD-10-CM | POA: Diagnosis not present

## 2020-04-01 DIAGNOSIS — Z96642 Presence of left artificial hip joint: Secondary | ICD-10-CM | POA: Diagnosis not present

## 2020-04-01 DIAGNOSIS — Z96653 Presence of artificial knee joint, bilateral: Secondary | ICD-10-CM | POA: Diagnosis present

## 2020-04-01 DIAGNOSIS — Z9989 Dependence on other enabling machines and devices: Secondary | ICD-10-CM | POA: Diagnosis not present

## 2020-04-01 DIAGNOSIS — D62 Acute posthemorrhagic anemia: Secondary | ICD-10-CM | POA: Diagnosis not present

## 2020-04-01 DIAGNOSIS — Z6841 Body Mass Index (BMI) 40.0 and over, adult: Secondary | ICD-10-CM

## 2020-04-01 DIAGNOSIS — Z79899 Other long term (current) drug therapy: Secondary | ICD-10-CM

## 2020-04-01 DIAGNOSIS — K219 Gastro-esophageal reflux disease without esophagitis: Secondary | ICD-10-CM | POA: Diagnosis present

## 2020-04-01 DIAGNOSIS — Z7982 Long term (current) use of aspirin: Secondary | ICD-10-CM | POA: Diagnosis not present

## 2020-04-01 DIAGNOSIS — Z90710 Acquired absence of both cervix and uterus: Secondary | ICD-10-CM | POA: Diagnosis not present

## 2020-04-01 DIAGNOSIS — T84068A Wear of articular bearing surface of other internal prosthetic joint, initial encounter: Secondary | ICD-10-CM

## 2020-04-01 DIAGNOSIS — S82122A Displaced fracture of lateral condyle of left tibia, initial encounter for closed fracture: Secondary | ICD-10-CM

## 2020-04-01 DIAGNOSIS — G4733 Obstructive sleep apnea (adult) (pediatric): Secondary | ICD-10-CM | POA: Diagnosis not present

## 2020-04-01 DIAGNOSIS — I10 Essential (primary) hypertension: Secondary | ICD-10-CM | POA: Diagnosis present

## 2020-04-01 DIAGNOSIS — Z471 Aftercare following joint replacement surgery: Secondary | ICD-10-CM | POA: Diagnosis not present

## 2020-04-01 DIAGNOSIS — T884XXA Failed or difficult intubation, initial encounter: Secondary | ICD-10-CM

## 2020-04-01 DIAGNOSIS — F32A Depression, unspecified: Secondary | ICD-10-CM | POA: Diagnosis present

## 2020-04-01 DIAGNOSIS — E559 Vitamin D deficiency, unspecified: Secondary | ICD-10-CM | POA: Diagnosis not present

## 2020-04-01 DIAGNOSIS — Z96649 Presence of unspecified artificial hip joint: Secondary | ICD-10-CM

## 2020-04-01 HISTORY — PX: ANTERIOR HIP REVISION: SHX6527

## 2020-04-01 HISTORY — DX: Failed or difficult intubation, initial encounter: T88.4XXA

## 2020-04-01 LAB — TYPE AND SCREEN
ABO/RH(D): B POS
Antibody Screen: POSITIVE
Unit division: 0
Unit division: 0

## 2020-04-01 LAB — BPAM RBC
Blood Product Expiration Date: 202203112359
Blood Product Expiration Date: 202203302359
Unit Type and Rh: 7300
Unit Type and Rh: 7300

## 2020-04-01 SURGERY — REVISION, TOTAL ARTHROPLASTY, HIP, ANTERIOR APPROACH
Anesthesia: General | Site: Hip | Laterality: Left

## 2020-04-01 MED ORDER — FENTANYL CITRATE (PF) 100 MCG/2ML IJ SOLN
INTRAMUSCULAR | Status: AC
  Filled 2020-04-01: qty 2

## 2020-04-01 MED ORDER — OXYCODONE HCL 5 MG PO TABS
10.0000 mg | ORAL_TABLET | ORAL | Status: DC | PRN
  Administered 2020-04-02: 10 mg via ORAL

## 2020-04-01 MED ORDER — LACTATED RINGERS IV SOLN: INTRAVENOUS | Status: DC

## 2020-04-01 MED ORDER — ASPIRIN 81 MG PO CHEW
81.0000 mg | CHEWABLE_TABLET | Freq: Two times a day (BID) | ORAL | Status: DC
  Administered 2020-04-01 – 2020-04-02 (×2): 81 mg via ORAL
  Filled 2020-04-01 (×2): qty 1

## 2020-04-01 MED ORDER — POLYETHYLENE GLYCOL 3350 17 G PO PACK: 17.0000 g | PACK | Freq: Every day | ORAL | Status: DC | PRN

## 2020-04-01 MED ORDER — ROCURONIUM 10MG/ML (10ML) SYRINGE FOR MEDFUSION PUMP - OPTIME
INTRAVENOUS | Status: DC | PRN
  Administered 2020-04-01: 50 mg via INTRAVENOUS

## 2020-04-01 MED ORDER — SODIUM CHLORIDE 0.9 % IR SOLN
Status: DC | PRN
  Administered 2020-04-01: 1000 mL

## 2020-04-01 MED ORDER — SUGAMMADEX SODIUM 500 MG/5ML IV SOLN
INTRAVENOUS | Status: AC
  Filled 2020-04-01: qty 5

## 2020-04-01 MED ORDER — PHENYLEPHRINE HCL (PRESSORS) 10 MG/ML IV SOLN
INTRAVENOUS | Status: AC
  Filled 2020-04-01: qty 1

## 2020-04-01 MED ORDER — POLYVINYL ALCOHOL 1.4 % OP SOLN
1.0000 [drp] | OPHTHALMIC | Status: DC | PRN
  Filled 2020-04-01 (×2): qty 15

## 2020-04-01 MED ORDER — SUCCINYLCHOLINE CHLORIDE 20 MG/ML IJ SOLN
INTRAMUSCULAR | Status: DC | PRN
  Administered 2020-04-01: 150 mg via INTRAVENOUS

## 2020-04-01 MED ORDER — STERILE WATER FOR IRRIGATION IR SOLN
Status: DC | PRN
  Administered 2020-04-01: 2000 mL

## 2020-04-01 MED ORDER — ACETAMINOPHEN 325 MG PO TABS
325.0000 mg | ORAL_TABLET | Freq: Four times a day (QID) | ORAL | Status: DC | PRN
  Administered 2020-04-02: 650 mg via ORAL
  Filled 2020-04-01: qty 2

## 2020-04-01 MED ORDER — ORAL CARE MOUTH RINSE: 15.0000 mL | Freq: Once | OROMUCOSAL | Status: AC

## 2020-04-01 MED ORDER — METOCLOPRAMIDE HCL 5 MG/ML IJ SOLN: 5.0000 mg | Freq: Three times a day (TID) | INTRAMUSCULAR | Status: DC | PRN

## 2020-04-01 MED ORDER — SUGAMMADEX SODIUM 500 MG/5ML IV SOLN
INTRAVENOUS | Status: DC | PRN
  Administered 2020-04-01: 300 mg via INTRAVENOUS

## 2020-04-01 MED ORDER — DOCUSATE SODIUM 100 MG PO CAPS
100.0000 mg | ORAL_CAPSULE | Freq: Two times a day (BID) | ORAL | Status: DC
  Administered 2020-04-01 – 2020-04-02 (×2): 100 mg via ORAL
  Filled 2020-04-01 (×2): qty 1

## 2020-04-01 MED ORDER — ALUM & MAG HYDROXIDE-SIMETH 200-200-20 MG/5ML PO SUSP: 30.0000 mL | ORAL | Status: DC | PRN

## 2020-04-01 MED ORDER — POTASSIUM CHLORIDE CRYS ER 20 MEQ PO TBCR: 40.0000 meq | EXTENDED_RELEASE_TABLET | Freq: Every day | ORAL | Status: DC

## 2020-04-01 MED ORDER — PROPOFOL 10 MG/ML IV BOLUS
INTRAVENOUS | Status: AC
  Filled 2020-04-01: qty 20

## 2020-04-01 MED ORDER — ONDANSETRON HCL 4 MG/2ML IJ SOLN
INTRAMUSCULAR | Status: AC
  Filled 2020-04-01: qty 2

## 2020-04-01 MED ORDER — CLINDAMYCIN PHOSPHATE 600 MG/50ML IV SOLN
600.0000 mg | Freq: Four times a day (QID) | INTRAVENOUS | Status: AC
  Administered 2020-04-01 – 2020-04-02 (×2): 600 mg via INTRAVENOUS
  Filled 2020-04-01 (×2): qty 50

## 2020-04-01 MED ORDER — VITAMIN E 45 MG (100 UNIT) PO CAPS
400.0000 [IU] | ORAL_CAPSULE | Freq: Every day | ORAL | Status: DC
  Administered 2020-04-02: 400 [IU] via ORAL
  Filled 2020-04-01: qty 4

## 2020-04-01 MED ORDER — HYDROMORPHONE HCL 2 MG/ML IJ SOLN
INTRAMUSCULAR | Status: AC
  Filled 2020-04-01: qty 1

## 2020-04-01 MED ORDER — MENTHOL 3 MG MT LOZG: 1.0000 | LOZENGE | OROMUCOSAL | Status: DC | PRN

## 2020-04-01 MED ORDER — SUCCINYLCHOLINE CHLORIDE 200 MG/10ML IV SOSY
PREFILLED_SYRINGE | INTRAVENOUS | Status: AC
  Filled 2020-04-01: qty 10

## 2020-04-01 MED ORDER — ONDANSETRON HCL 4 MG/2ML IJ SOLN: 4.0000 mg | Freq: Once | INTRAMUSCULAR | Status: DC | PRN

## 2020-04-01 MED ORDER — ROCURONIUM BROMIDE 10 MG/ML (PF) SYRINGE
PREFILLED_SYRINGE | INTRAVENOUS | Status: AC
  Filled 2020-04-01: qty 10

## 2020-04-01 MED ORDER — HYDROMORPHONE HCL 1 MG/ML IJ SOLN
0.5000 mg | INTRAMUSCULAR | Status: DC | PRN
  Administered 2020-04-01: 1 mg via INTRAVENOUS
  Filled 2020-04-01: qty 1

## 2020-04-01 MED ORDER — LACTATED RINGERS IV SOLN: INTRAVENOUS | Status: DC | PRN

## 2020-04-01 MED ORDER — FUROSEMIDE 20 MG PO TABS
20.0000 mg | ORAL_TABLET | Freq: Two times a day (BID) | ORAL | Status: DC
  Administered 2020-04-02: 20 mg via ORAL
  Filled 2020-04-01: qty 1

## 2020-04-01 MED ORDER — HYDROMORPHONE HCL 1 MG/ML IJ SOLN
INTRAMUSCULAR | Status: DC | PRN
  Administered 2020-04-01: .5 mg via INTRAVENOUS

## 2020-04-01 MED ORDER — PROPOFOL 10 MG/ML IV BOLUS
INTRAVENOUS | Status: DC | PRN
  Administered 2020-04-01: 20 mg via INTRAVENOUS
  Administered 2020-04-01: 140 mg via INTRAVENOUS
  Administered 2020-04-01 (×2): 20 mg via INTRAVENOUS

## 2020-04-01 MED ORDER — SERTRALINE HCL 100 MG PO TABS
100.0000 mg | ORAL_TABLET | Freq: Every day | ORAL | Status: DC
  Administered 2020-04-02: 100 mg via ORAL
  Filled 2020-04-01: qty 1

## 2020-04-01 MED ORDER — FENTANYL CITRATE (PF) 100 MCG/2ML IJ SOLN
INTRAMUSCULAR | Status: DC | PRN
  Administered 2020-04-01 (×2): 100 ug via INTRAVENOUS

## 2020-04-01 MED ORDER — DEXAMETHASONE SODIUM PHOSPHATE 10 MG/ML IJ SOLN
INTRAMUSCULAR | Status: DC | PRN
  Administered 2020-04-01: 10 mg via INTRAVENOUS

## 2020-04-01 MED ORDER — LATANOPROST 0.005 % OP SOLN
1.0000 [drp] | Freq: Every day | OPHTHALMIC | Status: DC
  Administered 2020-04-01: 1 [drp] via OPHTHALMIC
  Filled 2020-04-01: qty 2.5

## 2020-04-01 MED ORDER — METHOCARBAMOL 500 MG PO TABS
500.0000 mg | ORAL_TABLET | Freq: Four times a day (QID) | ORAL | Status: DC | PRN
  Administered 2020-04-02: 500 mg via ORAL
  Filled 2020-04-01: qty 1

## 2020-04-01 MED ORDER — 0.9 % SODIUM CHLORIDE (POUR BTL) OPTIME
TOPICAL | Status: DC | PRN
  Administered 2020-04-01: 1000 mL

## 2020-04-01 MED ORDER — SODIUM CHLORIDE 0.9 % IV SOLN: INTRAVENOUS | Status: DC

## 2020-04-01 MED ORDER — PANTOPRAZOLE SODIUM 40 MG PO TBEC
40.0000 mg | DELAYED_RELEASE_TABLET | Freq: Every day | ORAL | Status: DC
  Administered 2020-04-01 – 2020-04-02 (×2): 40 mg via ORAL
  Filled 2020-04-01: qty 1

## 2020-04-01 MED ORDER — CHLORHEXIDINE GLUCONATE 0.12 % MT SOLN
15.0000 mL | Freq: Once | OROMUCOSAL | Status: AC
  Administered 2020-04-01: 15 mL via OROMUCOSAL

## 2020-04-01 MED ORDER — LOSARTAN POTASSIUM 25 MG PO TABS
25.0000 mg | ORAL_TABLET | Freq: Every day | ORAL | Status: DC
  Administered 2020-04-02: 25 mg via ORAL
  Filled 2020-04-01: qty 1

## 2020-04-01 MED ORDER — CARBOXYMETHYLCELLULOSE SODIUM 0.5 % OP SOLN: 2.0000 [drp] | Freq: Four times a day (QID) | OPHTHALMIC | Status: DC | PRN

## 2020-04-01 MED ORDER — VITAMIN D3 25 MCG (1000 UNIT) PO TABS
1000.0000 [IU] | ORAL_TABLET | Freq: Every day | ORAL | Status: DC
  Administered 2020-04-02: 1000 [IU] via ORAL
  Filled 2020-04-01 (×2): qty 1

## 2020-04-01 MED ORDER — PHENOL 1.4 % MT LIQD: 1.0000 | OROMUCOSAL | Status: DC | PRN

## 2020-04-01 MED ORDER — ACETAMINOPHEN 10 MG/ML IV SOLN: 1000.0000 mg | Freq: Once | INTRAVENOUS | Status: DC | PRN

## 2020-04-01 MED ORDER — POVIDONE-IODINE 10 % EX SWAB
2.0000 "application " | Freq: Once | CUTANEOUS | Status: AC
  Administered 2020-04-01: 2 via TOPICAL

## 2020-04-01 MED ORDER — TRANEXAMIC ACID-NACL 1000-0.7 MG/100ML-% IV SOLN
1000.0000 mg | INTRAVENOUS | Status: AC
  Administered 2020-04-01: 1000 mg via INTRAVENOUS
  Filled 2020-04-01: qty 100

## 2020-04-01 MED ORDER — METOCLOPRAMIDE HCL 5 MG PO TABS
5.0000 mg | ORAL_TABLET | Freq: Three times a day (TID) | ORAL | Status: DC | PRN
Start: 2020-04-01 — End: 2020-04-02

## 2020-04-01 MED ORDER — PROPOFOL 500 MG/50ML IV EMUL
INTRAVENOUS | Status: AC
  Filled 2020-04-01: qty 50

## 2020-04-01 MED ORDER — OXYCODONE HCL 5 MG PO TABS
5.0000 mg | ORAL_TABLET | ORAL | Status: DC | PRN
  Administered 2020-04-01: 10 mg via ORAL
  Administered 2020-04-02: 5 mg via ORAL
  Filled 2020-04-01: qty 1
  Filled 2020-04-01 (×2): qty 2

## 2020-04-01 MED ORDER — ONDANSETRON HCL 4 MG PO TABS: 4.0000 mg | ORAL_TABLET | Freq: Four times a day (QID) | ORAL | Status: DC | PRN

## 2020-04-01 MED ORDER — ONDANSETRON HCL 4 MG/2ML IJ SOLN: 4.0000 mg | Freq: Four times a day (QID) | INTRAMUSCULAR | Status: DC | PRN

## 2020-04-01 MED ORDER — CLINDAMYCIN PHOSPHATE 900 MG/50ML IV SOLN
900.0000 mg | INTRAVENOUS | Status: AC
  Administered 2020-04-01: 900 mg via INTRAVENOUS
  Filled 2020-04-01: qty 50

## 2020-04-01 MED ORDER — METHOCARBAMOL 500 MG IVPB - SIMPLE MED
500.0000 mg | Freq: Four times a day (QID) | INTRAVENOUS | Status: DC | PRN
  Filled 2020-04-01: qty 50

## 2020-04-01 MED ORDER — ASCORBIC ACID 500 MG PO TABS
500.0000 mg | ORAL_TABLET | Freq: Every day | ORAL | Status: DC
  Administered 2020-04-02: 500 mg via ORAL
  Filled 2020-04-01: qty 1

## 2020-04-01 MED ORDER — HYDROMORPHONE HCL 1 MG/ML IJ SOLN: 0.2500 mg | INTRAMUSCULAR | Status: DC | PRN

## 2020-04-01 MED ORDER — ONDANSETRON HCL 4 MG/2ML IJ SOLN
INTRAMUSCULAR | Status: DC | PRN
  Administered 2020-04-01: 4 mg via INTRAVENOUS

## 2020-04-01 MED ORDER — DEXAMETHASONE SODIUM PHOSPHATE 10 MG/ML IJ SOLN
INTRAMUSCULAR | Status: AC
  Filled 2020-04-01: qty 1

## 2020-04-01 SURGICAL SUPPLY — 39 items
APL SKNCLS STERI-STRIP NONHPOA (GAUZE/BANDAGES/DRESSINGS) ×1
BAG SPEC THK2 15X12 ZIP CLS (MISCELLANEOUS)
BAG ZIPLOCK 12X15 (MISCELLANEOUS) IMPLANT
BENZOIN TINCTURE PRP APPL 2/3 (GAUZE/BANDAGES/DRESSINGS) ×2 IMPLANT
BLADE SAW SGTL 18X1.27X75 (BLADE) ×2 IMPLANT
CELLS DAT CNTRL 66122 CELL SVR (MISCELLANEOUS) IMPLANT
CLOTH BEACON ORANGE TIMEOUT ST (SAFETY) ×2 IMPLANT
COVER PERINEAL POST (MISCELLANEOUS) ×2 IMPLANT
COVER SURGICAL LIGHT HANDLE (MISCELLANEOUS) ×2 IMPLANT
COVER WAND RF STERILE (DRAPES) IMPLANT
DRAPE STERI IOBAN 125X83 (DRAPES) ×2 IMPLANT
DRAPE U-SHAPE 47X51 STRL (DRAPES) ×4 IMPLANT
DRSG AQUACEL AG ADV 3.5X10 (GAUZE/BANDAGES/DRESSINGS) ×2 IMPLANT
DURAPREP 26ML APPLICATOR (WOUND CARE) ×2 IMPLANT
ELECT REM PT RETURN 15FT ADLT (MISCELLANEOUS) ×2 IMPLANT
GAUZE XEROFORM 1X8 LF (GAUZE/BANDAGES/DRESSINGS) ×2 IMPLANT
GLOVE INDICATOR 8.0 STRL GRN (GLOVE) ×4 IMPLANT
GLOVE SURG ENC MOIS LTX SZ7.5 (GLOVE) ×4 IMPLANT
GLOVE SURG LTX SZ8 (GLOVE) ×2 IMPLANT
GOWN STRL REUS W/TWL LRG LVL3 (GOWN DISPOSABLE) ×4 IMPLANT
HANDPIECE INTERPULSE COAX TIP (DISPOSABLE) ×2
HEAD TAPER LRG 36 (Hips) ×2 IMPLANT
HOLDER FOLEY CATH W/STRAP (MISCELLANEOUS) ×2 IMPLANT
KIT TURNOVER KIT A (KITS) ×2 IMPLANT
LINER MARATHON 41OD 36X56 (Hips) ×2 IMPLANT
PACK ANTERIOR HIP CUSTOM (KITS) ×2 IMPLANT
PENCIL SMOKE EVACUATOR (MISCELLANEOUS) IMPLANT
RING LOCK ACET OD 56/68 (Hips) ×2 IMPLANT
RTRCTR WOUND ALEXIS 18CM MED (MISCELLANEOUS)
SET HNDPC FAN SPRY TIP SCT (DISPOSABLE) ×1 IMPLANT
STRIP CLOSURE SKIN 1/2X4 (GAUZE/BANDAGES/DRESSINGS) ×2 IMPLANT
SUT MNCRL AB 4-0 PS2 18 (SUTURE) ×2 IMPLANT
SUT VIC AB 0 CT1 36 (SUTURE) ×2 IMPLANT
SUT VIC AB 1 CT1 36 (SUTURE) ×6 IMPLANT
SUT VIC AB 2-0 CT1 27 (SUTURE) ×4
SUT VIC AB 2-0 CT1 TAPERPNT 27 (SUTURE) ×2 IMPLANT
TRAY FOLEY MTR SLVR 16FR STAT (SET/KITS/TRAYS/PACK) ×2 IMPLANT
WATER STERILE IRR 1000ML POUR (IV SOLUTION) ×2 IMPLANT
YANKAUER SUCT BULB TIP NO VENT (SUCTIONS) ×2 IMPLANT

## 2020-04-01 NOTE — Anesthesia Postprocedure Evaluation (Signed)
Anesthesia Post Note  Patient: Taylor Mccall  Procedure(s) Performed: ANTERIOR LEFT HIP REVISION OF ACETABULAR COMPONENT (Left Hip)     Patient location during evaluation: PACU Anesthesia Type: General Level of consciousness: awake and alert Pain management: pain level controlled Vital Signs Assessment: post-procedure vital signs reviewed and stable Respiratory status: spontaneous breathing, nonlabored ventilation, respiratory function stable and patient connected to nasal cannula oxygen Cardiovascular status: blood pressure returned to baseline and stable Postop Assessment: no apparent nausea or vomiting Anesthetic complications: no   No complications documented.  Last Vitals:  Vitals:   04/01/20 1500 04/01/20 1523  BP: 132/74 137/68  Pulse: 73 73  Resp: 16 (!) 21  Temp:    SpO2: 100% 100%    Last Pain:  Vitals:   04/01/20 1636  TempSrc:   PainSc: 6                  Gregory P Stoltzfus

## 2020-04-01 NOTE — Brief Op Note (Signed)
04/01/2020  1:52 PM  PATIENT:  Taylor Mccall  75 y.o. female  PRE-OPERATIVE DIAGNOSIS:  polyethylene liner wear left hip  POST-OPERATIVE DIAGNOSIS:  polyethylene liner wear left hip  PROCEDURE:  Procedure(s) with comments: ANTERIOR LEFT HIP REVISION OF ACETABULAR COMPONENT (Left) - 3E  SURGEON:  Surgeon(s) and Role:    * Mcarthur Rossetti, MD - Primary  PHYSICIAN ASSISTANT:  Benita Stabile, PA-C  ANESTHESIA:   spinal and general  EBL:  225 mL   COUNTS:  YES  DICTATION: .Other Dictation: Dictation Number [id number]  PLAN OF CARE: Admit to inpatient   PATIENT DISPOSITION:  PACU - hemodynamically stable.   Delay start of Pharmacological VTE agent (>24hrs) due to surgical blood loss or risk of bleeding: no

## 2020-04-01 NOTE — Anesthesia Procedure Notes (Signed)
Procedure Name: Intubation Date/Time: 04/01/2020 12:19 PM Performed by: Sharlette Dense, CRNA Patient Re-evaluated:Patient Re-evaluated prior to induction Oxygen Delivery Method: Circle system utilized Preoxygenation: Pre-oxygenation with 100% oxygen Induction Type: IV induction Laryngoscope Size: Glidescope and 3 Grade View: Grade I Tube type: Oral Tube size: 7.5 mm Number of attempts: 2 Airway Equipment and Method: Stylet and Video-laryngoscopy Placement Confirmation: ETT inserted through vocal cords under direct vision,  positive ETCO2 and breath sounds checked- equal and bilateral Secured at: 22 cm Tube secured with: Tape Dental Injury: Teeth and Oropharynx as per pre-operative assessment  Difficulty Due To: Difficulty was anticipated, Difficult Airway- due to dentition and Difficult Airway- due to anterior larynx Future Recommendations: Recommend- induction with short-acting agent, and alternative techniques readily available

## 2020-04-01 NOTE — Interval H&P Note (Signed)
History and Physical Interval Note: The patient understands fully that she is here today for revision of her left hip acetabular liner.  That hip was replaced about 30 years ago.  The polyethylene liner has worn out.  She understands that we will replace the liner and the femoral head but may need to revise the acetabular component as indicated intraoperatively.  There has been no acute interval change in her medical status.  See recent H&P.  The risks and benefits of surgery been discussed in detail and informed consent is obtained.  04/01/2020 9:51 AM  Taylor Mccall  has presented today for surgery, with the diagnosis of polyethylene liner wear left hip.  The various methods of treatment have been discussed with the patient and family. After consideration of risks, benefits and other options for treatment, the patient has consented to  Procedure(s) with comments: [place] (Left) - 3E as a surgical intervention.  The patient's history has been reviewed, patient examined, no change in status, stable for surgery.  I have reviewed the patient's chart and labs.  Questions were answered to the patient's satisfaction.     Mcarthur Rossetti

## 2020-04-01 NOTE — Anesthesia Preprocedure Evaluation (Addendum)
Anesthesia Evaluation  Patient identified by MRN, date of birth, ID band Patient awake    Reviewed: Allergy & Precautions, NPO status , Patient's Chart, lab work & pertinent test results  Airway Mallampati: II  TM Distance: >3 FB Neck ROM: Full    Dental  (+) Teeth Intact   Pulmonary sleep apnea and Continuous Positive Airway Pressure Ventilation , former smoker,    Pulmonary exam normal        Cardiovascular hypertension, Pt. on medications  Rhythm:Regular Rate:Normal     Neuro/Psych Depression negative neurological ROS     GI/Hepatic Neg liver ROS, GERD  Medicated,  Endo/Other  negative endocrine ROS  Renal/GU negative Renal ROS  negative genitourinary   Musculoskeletal  (+) Arthritis , Osteoarthritis,    Abdominal (+)  Abdomen: soft. Bowel sounds: normal.  Peds  Hematology negative hematology ROS (+)   Anesthesia Other Findings   Reproductive/Obstetrics                             Anesthesia Physical Anesthesia Plan  ASA: II  Anesthesia Plan: General   Post-op Pain Management:    Induction: Intravenous  PONV Risk Score and Plan: 2 and Ondansetron, Dexamethasone, Propofol infusion and Treatment may vary due to age or medical condition  Airway Management Planned: Mask and Oral ETT  Additional Equipment: None  Intra-op Plan:   Post-operative Plan: Extubation in OR  Informed Consent: I have reviewed the patients History and Physical, chart, labs and discussed the procedure including the risks, benefits and alternatives for the proposed anesthesia with the patient or authorized representative who has indicated his/her understanding and acceptance.     Dental advisory given  Plan Discussed with: CRNA  Anesthesia Plan Comments: (Lab Results      Component                Value               Date                      WBC                      6.7                 03/28/2020                 HGB                      12.2                03/28/2020                HCT                      37.3                03/28/2020                MCV                      89.2                03/28/2020                PLT  250                 03/28/2020           Lab Results      Component                Value               Date                      NA                       142                 03/28/2020                K                        3.5                 03/28/2020                CO2                      24                  03/28/2020                GLUCOSE                  91                  03/28/2020                BUN                      12                  03/28/2020                CREATININE               0.83                03/28/2020                CALCIUM                  9.6                 03/28/2020                GFRNONAA                 >60                 03/28/2020                GFRAA                    84                  03/01/2020          )       Anesthesia Quick Evaluation

## 2020-04-01 NOTE — Transfer of Care (Signed)
Immediate Anesthesia Transfer of Care Note  Patient: Taylor Mccall  Procedure(s) Performed: ANTERIOR LEFT HIP REVISION OF ACETABULAR COMPONENT (Left Hip)  Patient Location: PACU  Anesthesia Type:General  Level of Consciousness: awake and alert   Airway & Oxygen Therapy: Patient Spontanous Breathing and Patient connected to face mask oxygen  Post-op Assessment: Report given to RN and Post -op Vital signs reviewed and stable  Post vital signs: Reviewed and stable  Last Vitals:  Vitals Value Taken Time  BP 117/82 04/01/20 1415  Temp    Pulse 78 04/01/20 1417  Resp 16 04/01/20 1417  SpO2 100 % 04/01/20 1417  Vitals shown include unvalidated device data.  Last Pain:  Vitals:   04/01/20 0924  TempSrc: Oral  PainSc: 4       Patients Stated Pain Goal: 3 (98/72/15 8727)  Complications: No complications documented.

## 2020-04-01 NOTE — Evaluation (Addendum)
Physical Therapy Evaluation Patient Details Name: Taylor Mccall MRN: [id number] DOB: 23-Sep-1945 Today's Date: 04/01/2020   History of Present Illness  Patient is 75 y.o. female s/p Lt THR (poly liner exchange) on 04/01/20 with PMH signficant for OA, RA, obestiy, HTN, GERD, depression, anxiety, bil LE lymphedema, BIl TKA.  Clinical Impression  Pt is a 75yo female s/p Lt THR POD 0. Pt reports that she is modified independent with use of RW and SPC for mobility at baseline. Pt demonstrated appropriate responses and followed commands giggling occasionally, supspect medications. Pt required MOD assist +2 for safety and power up to stand. Pt perfomed lateral stepping with MOD assist for safety and RW stability. PT deferred further mobility this session to maintain pt safety 2/2 lethargy.  Pt lives with her daughter at home. Recommend SNF vs home with family support and HHPT pending pt progress. Pt will benefit from skilled PT to increase independence and safety with mobility. Acute therapy to follow up during stay.      Follow Up Recommendations Follow surgeon's recommendation for DC plan and follow-up therapies;SNF;Home health PT (SNF vs HHPT pending progress with therapy)    Equipment Recommendations  3in1 (PT)    Recommendations for Other Services       Precautions / Restrictions Precautions Precautions: Fall Restrictions Weight Bearing Restrictions: No Other Position/Activity Restrictions: WBAT      Mobility  Bed Mobility Overal bed mobility: Needs Assistance Bed Mobility: Supine to Sit;Sit to Supine     Supine to sit: Mod assist;HOB elevated Sit to supine: Mod assist;HOB elevated   General bed mobility comments: MOD assist for progression of Lt LE 2/2 increased pain with cues for use of bed rail and sequencing. Pt required MOD assist of B LEs into bed with sit to supine transfer.    Transfers Overall transfer level: Needs assistance Equipment used: Rolling walker (2  wheeled) Transfers: Sit to/from Stand Sit to Stand: Mod assist;+2 physical assistance;+2 safety/equipment         General transfer comment: MOD assist +2 for safety and power up to stand with cues for safe hand placement. Pt perfomed lateral stepping with MOD assist for RW stabilty and safety with cues for lateral stepping pattern toward HOB.  Ambulation/Gait                Stairs            Wheelchair Mobility    Modified Rankin (Stroke Patients Only)       Balance Overall balance assessment: Needs assistance Sitting-balance support: Feet supported Sitting balance-Leahy Scale: Fair     Standing balance support: During functional activity;Bilateral upper extremity supported Standing balance-Leahy Scale: Poor Standing balance comment: use of RW and assist from therpaist to maintain standing balance                             Pertinent Vitals/Pain Pain Assessment: Faces Faces Pain Scale: Hurts whole lot Pain Location: Lt hip Pain Descriptors / Indicators: Tender;Sore;Discomfort (pulling) Pain Intervention(s): Limited activity within patient's tolerance;Monitored during session;Repositioned;Premedicated before session    Home Living Family/patient expects to be discharged to:: Private residence Living Arrangements: Children Available Help at Discharge: Family Type of Home: House Home Access: Stairs to enter Entrance Stairs-Rails: None Entrance Stairs-Number of Steps: 3 Home Layout: Two level;1/2 bath on main level;Bed/bath upstairs Home Equipment: Walker - 4 wheels;Cane - single point;Shower seat;Bedside commode Additional Comments: pt lives with her daughter who works  at night. Pt has half bath on main level but no bedroom to sleep.    Prior Function Level of Independence: Independent with assistive device(s)         Comments: pt using RW and SPC for mobility     Hand Dominance   Dominant Hand: Right    Extremity/Trunk Assessment    Upper Extremity Assessment Upper Extremity Assessment: Overall WFL for tasks assessed (pt able to pull with B UEs on bedrails to assist with scooting to [facility] in supine)    Lower Extremity Assessment Lower Extremity Assessment: LLE deficits/detail LLE Deficits / Details: pt with good Lt quad set strength and B 4+/5 dorsi/plantar flexion strength. LLE Sensation: WNL LLE Coordination: WNL    Cervical / Trunk Assessment Cervical / Trunk Assessment: Normal  Communication   Communication: No difficulties  Cognition Arousal/Alertness: Suspect due to medications (pt easilty alerted, slightly lethargic when supine) Behavior During Therapy: WFL for tasks assessed/performed Overall Cognitive Status: Within Functional Limits for tasks assessed                                 General Comments: pt became more alert once sitting EOB      General Comments      Exercises     Assessment/Plan    PT Assessment Patient needs continued PT services  PT Problem List Decreased strength;Decreased range of motion;Decreased activity tolerance;Decreased balance;Decreased mobility;Decreased knowledge of use of DME;Pain       PT Treatment Interventions DME instruction;Gait training;Stair training;Functional mobility training;Therapeutic activities;Therapeutic exercise;Balance training;Patient/family education    PT Goals (Current goals can be found in the Care Plan section)  Acute Rehab PT Goals Patient Stated Goal: none stated PT Goal Formulation: With patient Time For Goal Achievement: 04/08/20 Potential to Achieve Goals: Good    Frequency 7X/week   Barriers to discharge        Co-evaluation               AM-PAC PT "6 Clicks" Mobility  Outcome Measure Help needed turning from your back to your side while in a flat bed without using bedrails?: A Little Help needed moving from lying on your back to sitting on the side of a flat bed without using bedrails?: A Lot Help  needed moving to and from a bed to a chair (including a wheelchair)?: A Lot Help needed standing up from a chair using your arms (e.g., wheelchair or bedside chair)?: A Lot Help needed to walk in hospital room?: A Lot Help needed climbing 3-5 steps with a railing? : Total 6 Click Score: 12    End of Session Equipment Utilized During Treatment: Gait belt Activity Tolerance: Patient tolerated treatment well Patient left: in bed;with bed alarm set;with call bell/phone within reach Nurse Communication: Mobility status PT Visit Diagnosis: Unsteadiness on feet (R26.81);Muscle weakness (generalized) (M62.81);Pain Pain - Right/Left: Left Pain - part of body: Hip    Time: 6213-0865 PT Time Calculation (min) (ACUTE ONLY): 24 min   Charges:   PT Evaluation $PT Eval Low Complexity: 1 Low PT Treatments $Therapeutic Activity: 8-22 mins        Lauren Youngblood, SPT  Acute rehab    Lauren Youngblood 04/01/2020, 7:02 PM

## 2020-04-01 NOTE — Anesthesia Procedure Notes (Signed)
Spinal  Patient location during procedure: OR Start time: 04/01/2020 11:49 AM End time: 04/01/2020 12:10 PM Staffing Performed: anesthesiologist  Anesthesiologist: Darral Dash, DO Preanesthetic Checklist Completed: patient identified, IV checked, site marked, risks and benefits discussed, surgical consent, monitors and equipment checked, pre-op evaluation and timeout performed Spinal Block Patient position: sitting Prep: DuraPrep Patient monitoring: heart rate, cardiac monitor, continuous pulse ox and blood pressure Approach: midline Location: L3-4 Injection technique: single-shot Needle Needle type: Pencan  Needle gauge: 24 G Needle length: 10 cm Additional Notes - Spinal unsuccessful despite multiple attempts, techniques, alternate needles and patient positioning given body habitus and prior back instrumentation with scar tissue. Decision made to proceed with GA.   Patient identified. Risks/Benefits/Options discussed with patient including but not limited to bleeding, infection, nerve damage, paralysis, failed block, incomplete pain control, headache, blood pressure changes, nausea, vomiting, reactions to medications, itching and postpartum back pain. Confirmed with bedside nurse the patient's most recent platelet count. Confirmed with patient that they are not currently taking any anticoagulation, have any bleeding history or any family history of bleeding disorders. Patient expressed understanding and wished to proceed. All questions were answered. Sterile technique was used throughout the entire procedure. Please see nursing notes for vital signs. Warning signs of high block given to the patient including shortness of breath, tingling/numbness in hands, complete motor block, or any concerning symptoms with instructions to call for help. Patient was given instructions on fall risk and not to get out of bed. All questions and concerns addressed with instructions to call with any  issues or inadequate analgesia.

## 2020-04-02 DIAGNOSIS — G4733 Obstructive sleep apnea (adult) (pediatric): Secondary | ICD-10-CM | POA: Diagnosis not present

## 2020-04-02 LAB — BASIC METABOLIC PANEL
Anion gap: 7 (ref 5–15)
BUN: 14 mg/dL (ref 8–23)
CO2: 26 mmol/L (ref 22–32)
Calcium: 8.5 mg/dL — ABNORMAL LOW (ref 8.9–10.3)
Chloride: 108 mmol/L (ref 98–111)
Creatinine, Ser: 0.72 mg/dL (ref 0.44–1.00)
GFR, Estimated: >60 mL/min (ref >60)
Glucose, Bld: 118 mg/dL — ABNORMAL HIGH (ref 70–99)
Potassium: 4 mmol/L (ref 3.5–5.1)
Sodium: 141 mmol/L (ref 135–145)

## 2020-04-02 LAB — CBC
HCT: 31.3 % — ABNORMAL LOW (ref 36.0–46.0)
Hemoglobin: 9.9 g/dL — ABNORMAL LOW (ref 12.0–15.0)
MCH: 29.1 pg (ref 26.0–34.0)
MCHC: 31.6 g/dL (ref 30.0–36.0)
MCV: 92.1 fL (ref 80.0–100.0)
Platelets: 225 10*3/uL (ref 150–400)
RBC: 3.4 MIL/uL — ABNORMAL LOW (ref 3.87–5.11)
RDW: 12.9 % (ref 11.5–15.5)
WBC: 9.1 10*3/uL (ref 4.0–10.5)
nRBC: 0 % (ref 0.0–0.2)

## 2020-04-02 MED ORDER — ASPIRIN 81 MG PO CHEW: 81.0000 mg | CHEWABLE_TABLET | Freq: Two times a day (BID) | ORAL | 0 refills | Status: DC

## 2020-04-02 MED ORDER — OXYCODONE HCL 5 MG PO TABS: 5.0000 mg | ORAL_TABLET | Freq: Four times a day (QID) | ORAL | 0 refills | Status: DC | PRN

## 2020-04-02 NOTE — Progress Notes (Signed)
Subjective: 1 Day Post-Op Procedure(s) (LRB): ANTERIOR LEFT HIP REVISION OF ACETABULAR COMPONENT (Left) Patient reports pain as mild.  Acute blood loss anemia from surgery, but tolerating well.  Slow mobility thus far with therapy.  Objective: Vital signs in last 24 hours: Temp:  [97.4 F (36.3 C)-98.6 F (37 C)] 97.9 F (36.6 C) (02/26 0559) Pulse Rate:  [73-81] 75 (02/26 0559) Resp:  [13-21] 16 (02/26 0559) BP: (110-141)/(65-97) 114/66 (02/26 0559) SpO2:  [99 %-100 %] 100 % (02/26 0559) Weight:  [121.6 kg] 121.6 kg (02/25 1600)  Intake/Output from previous day: 02/25 0701 - 02/26 0700 In: 3828.9 [P.O.:480; I.V.:2965; IV Piggyback:384] Out: 1150 [Urine:925; Blood:225] Intake/Output this shift: No intake/output data recorded.  Recent Labs    04/02/20 0250  HGB 9.9*   Recent Labs    04/02/20 0250  WBC 9.1  RBC 3.40*  HCT 31.3*  PLT 225   Recent Labs    04/02/20 0250  NA 141  K 4.0  CL 108  CO2 26  BUN 14  CREATININE 0.72  GLUCOSE 118*  CALCIUM 8.5*   No results for input(s): LABPT, INR in the last 72 hours.  Sensation intact distally Intact pulses distally Dorsiflexion/Plantar flexion intact Incision: dressing C/D/I   Assessment/Plan: 1 Day Post-Op Procedure(s) (LRB): ANTERIOR LEFT HIP REVISION OF ACETABULAR COMPONENT (Left) Up with therapy She would rather go home with HHPT than SNF.  Will see how she progresses over the weekend.     Mcarthur Rossetti 04/02/2020, 8:54 AM

## 2020-04-02 NOTE — Discharge Summary (Signed)
Patient ID: Taylor Mccall MRN: [id number] DOB/AGE: 03-27-1945 75 y.o.  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 Anxiety   . Blood transfusion without reported diagnosis   . Breast mass, left   . Cataract   . Depression   . Difficult intubation 04/01/2020   Known difficult airway from previous records  . Gait disturbance   . GERD (gastroesophageal reflux disease)   . Hypertension   . Lymphedema of both lower extremities    Seeing OT at [facility] to Left Lower leg  . Obesity   . Osteoarthritis   . Rheumatoid arthritis(714.0)   . Sleep apnea    cpap  . Urinary incontinence   . Venous insufficiency     Surgeries: Procedure(s): ANTERIOR LEFT HIP REVISION OF ACETABULAR COMPONENT on 04/01/2020   Consultants:   Discharged Condition: Improved  Hospital Course: Taylor Mccall is an 75 y.o. female who was admitted 04/01/2020 for operative treatment ofPolyethylene liner wear following total hip arthroplasty requiring isolated polyethylene liner exchange ([place]). Patient has severe unremitting pain that affects sleep, daily activities, and work/hobbies. After pre-op clearance the patient was taken to the operating room on 04/01/2020 and underwent  Procedure(s): ANTERIOR LEFT HIP REVISION OF ACETABULAR COMPONENT.    Patient was given perioperative antibiotics:  Anti-infectives (From admission, onward)   Start     Dose/Rate Route Frequency Ordered Stop   04/01/20 1800  clindamycin (CLEOCIN) IVPB 600 mg        600 mg 100 mL/hr over 30 Minutes Intravenous Every 6 hours 04/01/20 1614 04/02/20 0112   04/01/20 0915  clindamycin (CLEOCIN) IVPB 900 mg        900 mg 100  mL/hr over 30 Minutes Intravenous On call to O.R. 04/01/20 0905 04/01/20 1230       Patient was given sequential compression devices, early ambulation, and chemoprophylaxis to prevent DVT.  Patient benefited maximally from hospital stay and there were no complications.    Recent vital signs:  Patient Vitals for the past 24 hrs:  BP Temp Temp src Pulse Resp SpO2 Height Weight  04/02/20 0937 106/63 98.4 F (36.9 C) Oral 80 16 100 % -- --  04/02/20 0559 114/66 97.9 F (36.6 C) Oral 75 16 100 % -- --  04/02/20 0124 122/70 98 F (36.7 C) Oral 76 16 100 % -- --  04/01/20 2155 125/72 -- -- 74 16 100 % -- --  04/01/20 1947 113/70 97.6 F (36.4 C) Oral 80 18 100 % -- --  04/01/20 1821 111/65 97.6 F (36.4 C) Oral 81 18 100 % -- --  04/01/20 1700 110/66 98.3 F (36.8 C) Oral 75 20 99 % -- --  04/01/20 1600 133/66 98.6 F (37 C) -- 77 18 100 % 5\' 10"  (1.778 m) 121.6 kg  04/01/20 1523 137/68 -- -- 73 (!) 21 100 % -- --  04/01/20 1500 132/74 -- -- 73 16 100 % -- --  04/01/20 1445 132/81 -- -- 75 16 100 % -- --  04/01/20 1430 (!) 123/97 -- -- 73 13 100 % -- --  04/01/20 1415 117/82 (!) 97.4 F (36.3 C) -- 73 15 100 % -- --     Recent laboratory studies:  Recent Labs    04/02/20 0250  WBC 9.1  HGB 9.9*  HCT 31.3*  PLT 225  NA 141  K 4.0  CL 108  CO2 26  BUN 14  CREATININE 0.72  GLUCOSE 118*  CALCIUM 8.5*     Discharge Medications:   Allergies as of 04/02/2020      Reactions   Penicillins Rash      Medication List    STOP taking these medications   aspirin 81 MG tablet Replaced by: aspirin 81 MG chewable tablet     TAKE these medications   Aleve 220 MG Caps Generic drug: Naproxen Sodium Take 220-440 mg by mouth 2 (two) times daily as needed (pain.).   aspirin 81 MG chewable tablet Chew 1 tablet (81 mg total) by mouth 2 (two) times daily. Replaces: aspirin 81 MG tablet   carboxymethylcellulose 0.5 % Soln Commonly known as: REFRESH PLUS Place 2 drops into the  right eye 4 (four) times daily as needed (for itchy eyes).   cholecalciferol 25 MCG (1000 UNIT) tablet Commonly known as: VITAMIN D Take 1,000 Units by mouth daily.   FISH OIL ADULT GUMMIES PO Take 2 tablets by mouth daily. (Gummy)   furosemide 20 MG tablet Commonly known as: LASIX TAKE 1 TABLET (20 MG TOTAL) BY MOUTH 2 (TWO) TIMES DAILY. **DUE FOR YEARLY PHYSICAL** What changed: additional instructions   HAIR SKIN & NAILS GUMMIES PO Take 2 tablets by mouth daily.   hydroxychloroquine 200 MG tablet Commonly known as: PLAQUENIL Take 1 tablet (200 mg total) by mouth 2 (two) times daily.   losartan 25 MG tablet Commonly known as: COZAAR Take 1 tablet (25 mg total) by mouth daily.   NON FORMULARY 1 each by Other route See admin instructions. Use CPAP nightly.   oxyCODONE 5 MG immediate release tablet Commonly known as: Oxy IR/ROXICODONE Take 1-2 tablets (5-10 mg total) by mouth every 6 (six) hours as needed for moderate pain (pain score 4-6).   pantoprazole 40 MG tablet Commonly known as: PROTONIX TAKE ONE TABLET BY MOUTH ONCE DAILY BEFORE BREAKFAST What changed:   how much to take  how to take this  when to take this   potassium chloride SA 20 MEQ tablet Commonly known as: KLOR-CON Take 2 tablets (40 mEq total) by mouth every morning. What changed: when to take this   PRESERVISION AREDS 2 PO Take 1 tablet by mouth in the morning and at bedtime.   ADULT GUMMY PO Take 2 tablets by mouth daily. VitaFusion Multivitamins for Women (Gummy)   sertraline 100 MG tablet Commonly known as: ZOLOFT Take 100 mg by mouth daily.   Travoprost (BAK Free) 0.004 % Soln ophthalmic solution Commonly known as: TRAVATAN Place 1 drop into both eyes at bedtime.   VITAMIN B-12 PO Take 2 tablets by mouth daily. (Gummy)   vitamin C 250 MG tablet Commonly known as: ASCORBIC ACID Take 500 mg by mouth daily. (Gummy)   vitamin E 180 MG (400 UNITS) capsule Take 400 Units by mouth  daily.            Durable Medical Equipment  (From admission, onward)         Start     Ordered   04/02/20 1306  DME 3 n 1  Once       Comments: barriatric   04/02/20 1305   04/01/20 1615  DME Walker rolling  Once       Question Answer Comment  Walker: With 5  Inch Wheels   Patient needs a walker to treat with the following condition Status post revision of total hip      04/01/20 1614          Diagnostic Studies: DG Pelvis Portable  Result Date: 04/01/2020 CLINICAL DATA:  Status post total hip replacement EXAM: PORTABLE PELVIS 1-2 VIEWS COMPARISON:  Intraoperative left hip images obtained April 01, 2020 earlier in the day FINDINGS: Frontal view of mid to lower pelvis and hips obtained. There is a total hip replacement on the left with prosthetic components appearing well-seated on frontal view. There is postoperative fixation in the proximal right femur with alignment anatomic. No fracture or dislocation. IMPRESSION: Postoperative changes bilaterally. Total hip replacement noted on the left with prosthetic components well-seated on frontal view. No acute fracture or dislocation. Electronically Signed   By: Lowella Grip III M.D.   On: 04/01/2020 16:14   DG C-Arm 1-60 Min-No Report  Result Date: 04/01/2020 Fluoroscopy was utilized by the requesting physician.  No radiographic interpretation.   DG HIP OPERATIVE UNILAT W OR W/O PELVIS LEFT  Result Date: 04/01/2020 CLINICAL DATA:  Revision of left hip arthroplasty. EXAM: OPERATIVE left HIP (WITH PELVIS IF PERFORMED) 2 VIEWS TECHNIQUE: Fluoroscopic spot image(s) were submitted for interpretation post-operatively. Radiation exposure index: 1.3801 mGy. COMPARISON:  January 05, 2020. FINDINGS: The left femoral and acetabular components appear to be well situated. IMPRESSION: Status post revision of left hip arthroplasty. Electronically Signed   By: Marijo Conception M.D.   On: 04/01/2020 14:15    Disposition: Discharge  disposition: 01-Home or [place].   Specialty: [place] Why: agency to provide home health physical therapy Contact information: [address] [place] [address] [phone]        Mcarthur Rossetti, MD Follow up in 2 week(s).   Specialty: Orthopedic Surgery Contact information: [address] [place] [address] [phone]                Signed: Mcarthur Rossetti 04/02/2020, 1:48 PM

## 2020-04-02 NOTE — TOC Progression Note (Signed)
Transition of Care [facility]) - Progression Note    Patient Details  Name: ROSEALYN LITTLE MRN: [id number] Date of Birth: [birth year]  [facility]) CM/SW Contact  Joaquin Courts, RN Phone Number: 04/02/2020, 11:02 AM  Clinical Narrative:    [facility] for HHPT.  [employer] to deliver rolling walker to bedside, patient has 3in1 at home.   Expected Discharge Plan: [place] Barriers to Discharge: No Barriers Identified  Expected Discharge Plan and Services Expected Discharge Plan: [place]   Discharge Planning Services: CM Consult Post Acute Care Choice: [place] arrangements for the past 2 months: Single Family Home                 DME Arranged: Walker rolling DME Agency: [employer] Date DME Agency Contacted: 04/02/20 Time DME Agency Contacted: 69 Representative spoke with at DME Agency: [place]: PT [facility]: [facility] [employer] [facility] [employer]     Representative spoke with at [place]: Pre arranged in md office   Social Determinants of Health ([place]) Interventions    Readmission Risk Interventions No flowsheet data found.

## 2020-04-02 NOTE — Discharge Instructions (Signed)
You may put all of your weight on your left hip as comfort allows. Do not try to abduct your hip and no significant external rotation of the hip with mainly pointing her toes forward. You can get your current dressing on your left hip incision wet in the shower daily. Change your dressing in 6 days with a similar dressing once. Do expect swelling -ice periodically as needed.

## 2020-04-02 NOTE — Progress Notes (Addendum)
Physical Therapy Treatment Patient Details Name: Taylor Mccall MRN: [id number] DOB: 02-10-1945 Today's Date: 04/02/2020    History of Present Illness Patient is 75 y.o. female s/p Lt THR (poly liner exchange) on 04/01/20 with PMH signficant for OA, RA, obestiy, HTN, GERD, depression, anxiety, bil LE lymphedema, BIl TKA.    PT Comments    Pt is ready to DC home from PT standpoint. She ambulated 85' with RW, completed stair training, and demonstrates good understanding of HEP.    Follow Up Recommendations  Follow surgeon's recommendation for DC plan and follow-up therapies;Home health PT (SNF vs HHPT pending progress with therapy)     Equipment Recommendations  3in1 (PT) -bariatric due to body habitus   Recommendations for Other Services       Precautions / Restrictions Precautions Precautions: Fall Restrictions Weight Bearing Restrictions: No LLE Weight Bearing: Weight bearing as tolerated Other Position/Activity Restrictions: WBAT    Mobility  Bed Mobility Overal bed mobility: Needs Assistance Bed Mobility: Supine to Sit     Supine to sit: Min guard     General bed mobility comments: up in recliner    Transfers Overall transfer level: Needs assistance Equipment used: Rolling walker (2 wheeled) Transfers: Sit to/from Stand Sit to Stand: Supervision         General transfer comment: VCs hand placement  Ambulation/Gait Ambulation/Gait assistance: Supervision Gait Distance (Feet): 110 Feet Assistive device: Rolling walker (2 wheeled) Gait Pattern/deviations: Step-to pattern Gait velocity: decr   General Gait Details: steady, no LOB   Stairs Stairs: Yes Stairs assistance: Min guard Stair Management: Forwards;One rail Right;With cane;Step to pattern Number of Stairs: 3 General stair comments: VCs sequencing, son present   Wheelchair Mobility    Modified Rankin (Stroke Patients Only)       Balance Overall balance assessment: Needs  assistance Sitting-balance support: Feet supported Sitting balance-Leahy Scale: Fair     Standing balance support: During functional activity;Bilateral upper extremity supported Standing balance-Leahy Scale: Poor Standing balance comment: use of RW and assist from therpaist to maintain standing balance                            Cognition Arousal/Alertness: Awake/alert Behavior During Therapy: WFL for tasks assessed/performed Overall Cognitive Status: Within Functional Limits for tasks assessed                                        Exercises General Exercises - Lower Extremity Ankle Circles/Pumps: AROM;Both;10 reps;Supine Quad Sets: AAROM;Left;5 reps;Supine Long Arc Quad: AROM;Left;10 reps;Seated Heel Slides: AAROM;Left;10 reps;Supine Hip ABduction/ADduction: Left;10 reps;AAROM;Supine    General Comments        Pertinent Vitals/Pain Pain Assessment: 0-10 Pain Score: 6  Pain Location: Lt hip with walking Pain Descriptors / Indicators: Tender;Sore;Discomfort;Grimacing Pain Intervention(s): Limited activity within patient's tolerance;Monitored during session;Premedicated before session;Ice applied    Home Living                      Prior Function            PT Goals (current goals can now be found in the care plan section) Acute Rehab PT Goals Patient Stated Goal: none stated PT Goal Formulation: With patient Time For Goal Achievement: 04/08/20 Potential to Achieve Goals: Good Progress towards PT goals: Progressing toward goals    Frequency    7X/week  PT Plan Current plan remains appropriate    Co-evaluation              AM-PAC PT "6 Clicks" Mobility   Outcome Measure  Help needed turning from your back to your side while in a flat bed without using bedrails?: A Little Help needed moving from lying on your back to sitting on the side of a flat bed without using bedrails?: A Little Help needed moving to  and from a bed to a chair (including a wheelchair)?: A Little Help needed standing up from a chair using your arms (e.g., wheelchair or bedside chair)?: A Little Help needed to walk in hospital room?: A Little Help needed climbing 3-5 steps with a railing? : A Lot 6 Click Score: 17    End of Session Equipment Utilized During Treatment: Gait belt Activity Tolerance: Patient tolerated treatment well Patient left: with call bell/phone within reach;in chair;with chair alarm set Nurse Communication: Mobility status PT Visit Diagnosis: Unsteadiness on feet (R26.81);Muscle weakness (generalized) (M62.81);Pain Pain - Right/Left: Left Pain - part of body: Hip     Time: 1236-1300 PT Time Calculation (min) (ACUTE ONLY): 24 min  Charges:  $Gait Training: 8-22 mins $Therapeutic Exercise: 8-22 mins                     Blondell Reveal Kistler PT 04/02/2020  Acute Rehabilitation Services Pager [phone] Office [phone]

## 2020-04-02 NOTE — Progress Notes (Signed)
Physical Therapy Treatment Patient Details Name: Taylor Mccall MRN: [id number] DOB: 04/24/1945 Today's Date: 04/02/2020    History of Present Illness Patient is 75 y.o. female s/p Lt THR (poly liner exchange) on 04/01/20 with PMH signficant for OA, RA, obestiy, HTN, GERD, depression, anxiety, bil LE lymphedema, BIl TKA.    PT Comments    Pt is progressing well with mobility. She ambulated 33' with RW without loss of balance. Initiated HEP. Will plan a second session today for stair training, then expect pt will be ready to DC home later today.   Follow Up Recommendations  Follow surgeon's recommendation for DC plan and follow-up therapies;Home health PT (SNF vs HHPT pending progress with therapy)     Equipment Recommendations  3in1 (PT)    Recommendations for Other Services       Precautions / Restrictions Precautions Precautions: Fall Restrictions Weight Bearing Restrictions: No Other Position/Activity Restrictions: WBAT    Mobility  Bed Mobility Overal bed mobility: Needs Assistance Bed Mobility: Supine to Sit     Supine to sit: Min guard     General bed mobility comments: used gait belt as a leg lifter, min/guard to guard LLE with OOB    Transfers Overall transfer level: Needs assistance Equipment used: Rolling walker (2 wheeled) Transfers: Sit to/from Stand Sit to Stand: Min assist         General transfer comment: VCs hand placement, min A to power up  Ambulation/Gait Ambulation/Gait assistance: Min guard Gait Distance (Feet): 90 Feet Assistive device: Rolling walker (2 wheeled) Gait Pattern/deviations: Step-to pattern Gait velocity: decr   General Gait Details: VCs sequencing and to lift head, no loss of balance, distance limited by fatigue   Stairs             Wheelchair Mobility    Modified Rankin (Stroke Patients Only)       Balance Overall balance assessment: Needs assistance Sitting-balance support: Feet supported Sitting  balance-Leahy Scale: Fair     Standing balance support: During functional activity;Bilateral upper extremity supported Standing balance-Leahy Scale: Poor Standing balance comment: use of RW and assist from therpaist to maintain standing balance                            Cognition Arousal/Alertness: Awake/alert Behavior During Therapy: WFL for tasks assessed/performed Overall Cognitive Status: Within Functional Limits for tasks assessed                                        Exercises General Exercises - Lower Extremity Ankle Circles/Pumps: AROM;Both;10 reps;Supine Quad Sets: AAROM;Left;5 reps;Supine Heel Slides: AAROM;Left;10 reps;Supine    General Comments        Pertinent Vitals/Pain Pain Assessment: 0-10 Pain Score: 2  Pain Location: Lt hip Pain Descriptors / Indicators: Tender;Sore;Discomfort Pain Intervention(s): Limited activity within patient's tolerance;Monitored during session;Premedicated before session;Ice applied    Home Living                      Prior Function            PT Goals (current goals can now be found in the care plan section) Acute Rehab PT Goals Patient Stated Goal: none stated PT Goal Formulation: With patient Time For Goal Achievement: 04/08/20 Potential to Achieve Goals: Good Progress towards PT goals: Progressing toward goals    Frequency  7X/week      PT Plan Current plan remains appropriate    Co-evaluation              AM-PAC PT "6 Clicks" Mobility   Outcome Measure  Help needed turning from your back to your side while in a flat bed without using bedrails?: A Little Help needed moving from lying on your back to sitting on the side of a flat bed without using bedrails?: A Little Help needed moving to and from a bed to a chair (including a wheelchair)?: A Little Help needed standing up from a chair using your arms (e.g., wheelchair or bedside chair)?: A Little Help needed to  walk in hospital room?: A Little Help needed climbing 3-5 steps with a railing? : A Lot 6 Click Score: 17    End of Session Equipment Utilized During Treatment: Gait belt Activity Tolerance: Patient tolerated treatment well Patient left: with call bell/phone within reach;in chair;with chair alarm set Nurse Communication: Mobility status PT Visit Diagnosis: Unsteadiness on feet (R26.81);Muscle weakness (generalized) (M62.81);Pain Pain - Right/Left: Left Pain - part of body: Hip     Time: 6789-3810 PT Time Calculation (min) (ACUTE ONLY): 29 min  Charges:  $Gait Training: 8-22 mins $Therapeutic Exercise: 8-22 mins                     Blondell Reveal Kistler PT 04/02/2020  Acute Rehabilitation Services Pager [phone] Office [phone]

## 2020-04-02 NOTE — TOC Progression Note (Signed)
Transition of Care [facility]) - Progression Note    Patient Details  Name: Taylor Mccall MRN: [id number] Date of Birth: October 19, 1945  [facility]) CM/SW Contact  Joaquin Courts, RN Phone Number:  04/02/2020, 2:17 PM  Clinical Narrative:    [employer] to deliver bariatric 3in1.  Per patient, current 3in1 she has in the home is too narrow for her use.  Expected Discharge Plan: [place] Barriers to Discharge: No Barriers Identified  Expected Discharge Plan and Services Expected Discharge Plan: [place]   Discharge Planning Services: CM Consult Post Acute Care Choice: [place] arrangements for the past 2 months: Single Family Home Expected Discharge Date: 04/02/20               DME Arranged: 3-N-1 DME Agency: [employer] Date DME Agency Contacted: 04/02/20 Time DME Agency Contacted: 1100 Representative spoke with at DME Agency: [place]: PT [place]: [facility] [employer] [facility] [employer]     Representative spoke with at [place]: Pre arranged in md office   Social Determinants of Health ([place]) Interventions    Readmission Risk Interventions No flowsheet data found.

## 2020-04-02 NOTE — Op Note (Signed)
NAME: Taylor Mccall, Taylor A. MEDICAL RECORD NO: [id number] ACCOUNT NO: [id number] DATE OF BIRTH: [birth year] FACILITY: [facility]: [facility] PHYSICIAN: Lind Guest. Ninfa Linden, MD  Operative Report   DATE OF PROCEDURE: 04/01/2020  PREOPERATIVE DIAGNOSIS:  Left total hip arthroplasty with polyethylene liner wear.  POSTOPERATIVE DIAGNOSIS:  Left total hip arthroplasty with polyethylene liner wear.  PROCEDURE:  Left total hip revision arthroplasty with revision of polyethylene liner and hip ball.  FINDINGS:  Polyethylene liner wear of left hip acetabular component with dissociation from the acetabulum.  There was no loosening of the acetabular component at all or the femoral component.  IMPLANTS:   1.  Duraloc DePuy acetabular liner size 56 with a locking ring, size 36 x 56+4 with a 10-degree lip liner and a locking ring. 2.  A 14 x 16 large taper 36+8 metal hip ball.  SURGEON:  Lind Guest. Ninfa Linden, MD  ASSISTANT:  Erskine Emery, PA-C   ANESTHESIA:  1.  Attempted spinal. 2.  General.  BLOOD LOSS:  200 mL  ANTIBIOTICS:  3 grams IV Ancef.  COMPLICATIONS:  None.  INDICATIONS:  The patient is a 75 year old female who had a left total hip arthroplasty done about 30 years ago.  She had been having some dull aching pain in that left hip.  X-rays in the last few months showed a superior acetabular liner wear with the  hip ball in a more superior position.  The cup is slightly vertical and retroverted, but it does appear well ingrown into the bone as does the femoral component.  She has never had an incidence of instability.  She does walk a lot of times with a walker  or a cane.  She is morbidly obese as well with a BMI of over 40.  At this point, we have recommended through an anterior approach revision of the acetabular liner and hip ball and revising the acetabulum if it was deemed necessary intraoperatively.  DESCRIPTION OF PROCEDURE:  After informed consent was obtained, appropriate  left hip was marked and a long thorough discussion was had about this surgery with her before surgery and today, she was brought to the operating room and sat up on a stretcher.   Attempts at spinal anesthesia were unsuccessful.  She was laid back in the supine position and general anesthesia was obtained, a Foley catheter was placed and traction boots were placed on both her feet.  Next, she was placed supine on the Hana  fracture table, the perineal post in place and both legs in line skeletal traction device and no traction applied.  Her left operative hip was prepped and draped with DuraPrep and sterile drapes.  A timeout was called and she was identified correct  patient, correct left hip.  We then made an incision just inferior and posterior to the anterior superior iliac spine and carried this slightly obliquely down the leg. We dissected down through significant scar tissue down the tensor fascia and it was  divided longitudinally, even though there was significant scarring of that.  We were able to get down to the hip itself and opened up the hip capsule and found just a mild effusion.  We had to remove abundant scar tissue all around the hip capsule to  expose the hip itself.  We then dislocated the hip and removed the trial hip ball.  The femoral component was assessed and found to be solid and no evidence of loosening.  The acetabular component, which was a lipped liner, previously looked  like the  liner itself had become dissociated and the lip was more anterior.  We removed the liner and the locking ring without difficulty.  We then irrigated the hip area with a liter of normal saline solution using pulsatile lavage.  We then assessed the  acetabular component and it was bone ingrown and solid and did not move, we felt that it is better just to revise the polyethylene liner and hip ball today given her low level of activity with the idea that if she does dislocate, we would have to perform  a  more extensive surgery at another time.  We then placed the real 36 x 56+4 polyethylene liner with a 10-degree of lipped liner that we put in the more posterior position, given the verticality and the retroversion of her cup.  We then trialled a 36+8  trial hip ball with a 14 x 16 taper and reduced this in the acetabulum and it was very tight and stable and we decreased her leg lengths from what we had done preoperatively, which we felt it better for her.  We then removed that hip ball and placed the  real 36+8 metal hip ball and again reduced this in the acetabulum and it was stable.  We irrigated the soft tissue with normal saline solution.  There was no joint capsule remnants to close.  We closed the tensor fascia with interrupted #1 Vicryl suture  followed by 0 Vicryl in the deep tissue and 2-0 Vicryl in subcutaneous tissue.  The skin was reapproximated with staples.  An Aquacel dressing was applied.  She was taken off the Hana table, awakened, extubated, and taken to recovery room in stable  condition with all final counts being correct and no complications noted.   Of note, Benita Stabile, PA-C, assisted during the entire case and assistance with closure for facilitating all aspects of this case.  Of note, we are going to have her adhere to strict anterior hip precautions following surgery.   PAA D: 04/01/2020 1:50:23 pm T: 04/02/2020 12:36:00 am  JOB: [id number]/ [id number]

## 2020-04-03 DIAGNOSIS — G4733 Obstructive sleep apnea (adult) (pediatric): Secondary | ICD-10-CM | POA: Diagnosis not present

## 2020-04-03 DIAGNOSIS — H401132 Primary open-angle glaucoma, bilateral, moderate stage: Secondary | ICD-10-CM | POA: Diagnosis not present

## 2020-04-03 DIAGNOSIS — R69 Illness, unspecified: Secondary | ICD-10-CM | POA: Diagnosis not present

## 2020-04-03 DIAGNOSIS — M069 Rheumatoid arthritis, unspecified: Secondary | ICD-10-CM | POA: Diagnosis not present

## 2020-04-03 DIAGNOSIS — Z6839 Body mass index (BMI) 39.0-39.9, adult: Secondary | ICD-10-CM | POA: Diagnosis not present

## 2020-04-03 DIAGNOSIS — K219 Gastro-esophageal reflux disease without esophagitis: Secondary | ICD-10-CM | POA: Diagnosis not present

## 2020-04-03 DIAGNOSIS — I872 Venous insufficiency (chronic) (peripheral): Secondary | ICD-10-CM | POA: Diagnosis not present

## 2020-04-03 DIAGNOSIS — I10 Essential (primary) hypertension: Secondary | ICD-10-CM | POA: Diagnosis not present

## 2020-04-03 DIAGNOSIS — I890 Lymphedema, not elsewhere classified: Secondary | ICD-10-CM | POA: Diagnosis not present

## 2020-04-03 DIAGNOSIS — E559 Vitamin D deficiency, unspecified: Secondary | ICD-10-CM | POA: Diagnosis not present

## 2020-04-03 DIAGNOSIS — H353222 Exudative age-related macular degeneration, left eye, with inactive choroidal neovascularization: Secondary | ICD-10-CM | POA: Diagnosis not present

## 2020-04-03 DIAGNOSIS — E669 Obesity, unspecified: Secondary | ICD-10-CM | POA: Diagnosis not present

## 2020-04-03 DIAGNOSIS — Z471 Aftercare following joint replacement surgery: Secondary | ICD-10-CM | POA: Diagnosis not present

## 2020-04-03 DIAGNOSIS — D899 Disorder involving the immune mechanism, unspecified: Secondary | ICD-10-CM | POA: Diagnosis not present

## 2020-04-03 DIAGNOSIS — T84061D Wear of articular bearing surface of internal prosthetic left hip joint, subsequent encounter: Secondary | ICD-10-CM | POA: Diagnosis not present

## 2020-04-03 DIAGNOSIS — M19042 Primary osteoarthritis, left hand: Secondary | ICD-10-CM | POA: Diagnosis not present

## 2020-04-03 DIAGNOSIS — Z87891 Personal history of nicotine dependence: Secondary | ICD-10-CM | POA: Diagnosis not present

## 2020-04-03 DIAGNOSIS — M19041 Primary osteoarthritis, right hand: Secondary | ICD-10-CM | POA: Diagnosis not present

## 2020-04-03 DIAGNOSIS — H3530 Unspecified macular degeneration: Secondary | ICD-10-CM | POA: Diagnosis not present

## 2020-04-04 ENCOUNTER — Encounter (HOSPITAL_COMMUNITY): Payer: Self-pay | Admitting: Orthopaedic Surgery

## 2020-04-05 LAB — TYPE AND SCREEN
ABO/RH(D): B POS
Antibody Screen: POSITIVE
Unit division: 0
Unit division: 0

## 2020-04-05 LAB — BPAM RBC
Blood Product Expiration Date: 202203112359
Blood Product Expiration Date: 202203302359
Unit Type and Rh: 7300
Unit Type and Rh: 7300

## 2020-04-06 DIAGNOSIS — K219 Gastro-esophageal reflux disease without esophagitis: Secondary | ICD-10-CM | POA: Diagnosis not present

## 2020-04-06 DIAGNOSIS — I890 Lymphedema, not elsewhere classified: Secondary | ICD-10-CM | POA: Diagnosis not present

## 2020-04-06 DIAGNOSIS — T84061D Wear of articular bearing surface of internal prosthetic left hip joint, subsequent encounter: Secondary | ICD-10-CM | POA: Diagnosis not present

## 2020-04-06 DIAGNOSIS — Z87891 Personal history of nicotine dependence: Secondary | ICD-10-CM | POA: Diagnosis not present

## 2020-04-06 DIAGNOSIS — R69 Illness, unspecified: Secondary | ICD-10-CM | POA: Diagnosis not present

## 2020-04-06 DIAGNOSIS — H3530 Unspecified macular degeneration: Secondary | ICD-10-CM | POA: Diagnosis not present

## 2020-04-06 DIAGNOSIS — H353222 Exudative age-related macular degeneration, left eye, with inactive choroidal neovascularization: Secondary | ICD-10-CM | POA: Diagnosis not present

## 2020-04-06 DIAGNOSIS — H401132 Primary open-angle glaucoma, bilateral, moderate stage: Secondary | ICD-10-CM | POA: Diagnosis not present

## 2020-04-06 DIAGNOSIS — Z6839 Body mass index (BMI) 39.0-39.9, adult: Secondary | ICD-10-CM | POA: Diagnosis not present

## 2020-04-06 DIAGNOSIS — I10 Essential (primary) hypertension: Secondary | ICD-10-CM | POA: Diagnosis not present

## 2020-04-06 DIAGNOSIS — E669 Obesity, unspecified: Secondary | ICD-10-CM | POA: Diagnosis not present

## 2020-04-06 DIAGNOSIS — M19042 Primary osteoarthritis, left hand: Secondary | ICD-10-CM | POA: Diagnosis not present

## 2020-04-06 DIAGNOSIS — E559 Vitamin D deficiency, unspecified: Secondary | ICD-10-CM | POA: Diagnosis not present

## 2020-04-06 DIAGNOSIS — G4733 Obstructive sleep apnea (adult) (pediatric): Secondary | ICD-10-CM | POA: Diagnosis not present

## 2020-04-06 DIAGNOSIS — M069 Rheumatoid arthritis, unspecified: Secondary | ICD-10-CM | POA: Diagnosis not present

## 2020-04-06 DIAGNOSIS — D899 Disorder involving the immune mechanism, unspecified: Secondary | ICD-10-CM | POA: Diagnosis not present

## 2020-04-06 DIAGNOSIS — M19041 Primary osteoarthritis, right hand: Secondary | ICD-10-CM | POA: Diagnosis not present

## 2020-04-06 DIAGNOSIS — I872 Venous insufficiency (chronic) (peripheral): Secondary | ICD-10-CM | POA: Diagnosis not present

## 2020-04-08 DIAGNOSIS — H353222 Exudative age-related macular degeneration, left eye, with inactive choroidal neovascularization: Secondary | ICD-10-CM | POA: Diagnosis not present

## 2020-04-08 DIAGNOSIS — H3530 Unspecified macular degeneration: Secondary | ICD-10-CM | POA: Diagnosis not present

## 2020-04-08 DIAGNOSIS — M19041 Primary osteoarthritis, right hand: Secondary | ICD-10-CM | POA: Diagnosis not present

## 2020-04-08 DIAGNOSIS — M069 Rheumatoid arthritis, unspecified: Secondary | ICD-10-CM | POA: Diagnosis not present

## 2020-04-08 DIAGNOSIS — D899 Disorder involving the immune mechanism, unspecified: Secondary | ICD-10-CM | POA: Diagnosis not present

## 2020-04-08 DIAGNOSIS — R69 Illness, unspecified: Secondary | ICD-10-CM | POA: Diagnosis not present

## 2020-04-08 DIAGNOSIS — H401132 Primary open-angle glaucoma, bilateral, moderate stage: Secondary | ICD-10-CM | POA: Diagnosis not present

## 2020-04-08 DIAGNOSIS — Z6839 Body mass index (BMI) 39.0-39.9, adult: Secondary | ICD-10-CM | POA: Diagnosis not present

## 2020-04-08 DIAGNOSIS — E559 Vitamin D deficiency, unspecified: Secondary | ICD-10-CM | POA: Diagnosis not present

## 2020-04-08 DIAGNOSIS — G4733 Obstructive sleep apnea (adult) (pediatric): Secondary | ICD-10-CM | POA: Diagnosis not present

## 2020-04-08 DIAGNOSIS — K219 Gastro-esophageal reflux disease without esophagitis: Secondary | ICD-10-CM | POA: Diagnosis not present

## 2020-04-08 DIAGNOSIS — T84061D Wear of articular bearing surface of internal prosthetic left hip joint, subsequent encounter: Secondary | ICD-10-CM | POA: Diagnosis not present

## 2020-04-08 DIAGNOSIS — I872 Venous insufficiency (chronic) (peripheral): Secondary | ICD-10-CM | POA: Diagnosis not present

## 2020-04-08 DIAGNOSIS — Z87891 Personal history of nicotine dependence: Secondary | ICD-10-CM | POA: Diagnosis not present

## 2020-04-08 DIAGNOSIS — I890 Lymphedema, not elsewhere classified: Secondary | ICD-10-CM | POA: Diagnosis not present

## 2020-04-08 DIAGNOSIS — M19042 Primary osteoarthritis, left hand: Secondary | ICD-10-CM | POA: Diagnosis not present

## 2020-04-08 DIAGNOSIS — I10 Essential (primary) hypertension: Secondary | ICD-10-CM | POA: Diagnosis not present

## 2020-04-08 DIAGNOSIS — E669 Obesity, unspecified: Secondary | ICD-10-CM | POA: Diagnosis not present

## 2020-04-09 DIAGNOSIS — M19041 Primary osteoarthritis, right hand: Secondary | ICD-10-CM | POA: Diagnosis not present

## 2020-04-09 DIAGNOSIS — M19042 Primary osteoarthritis, left hand: Secondary | ICD-10-CM | POA: Diagnosis not present

## 2020-04-09 DIAGNOSIS — H353222 Exudative age-related macular degeneration, left eye, with inactive choroidal neovascularization: Secondary | ICD-10-CM | POA: Diagnosis not present

## 2020-04-09 DIAGNOSIS — I890 Lymphedema, not elsewhere classified: Secondary | ICD-10-CM | POA: Diagnosis not present

## 2020-04-09 DIAGNOSIS — Z87891 Personal history of nicotine dependence: Secondary | ICD-10-CM | POA: Diagnosis not present

## 2020-04-09 DIAGNOSIS — E559 Vitamin D deficiency, unspecified: Secondary | ICD-10-CM | POA: Diagnosis not present

## 2020-04-09 DIAGNOSIS — R69 Illness, unspecified: Secondary | ICD-10-CM | POA: Diagnosis not present

## 2020-04-09 DIAGNOSIS — D899 Disorder involving the immune mechanism, unspecified: Secondary | ICD-10-CM | POA: Diagnosis not present

## 2020-04-09 DIAGNOSIS — I10 Essential (primary) hypertension: Secondary | ICD-10-CM | POA: Diagnosis not present

## 2020-04-09 DIAGNOSIS — T84061D Wear of articular bearing surface of internal prosthetic left hip joint, subsequent encounter: Secondary | ICD-10-CM | POA: Diagnosis not present

## 2020-04-09 DIAGNOSIS — H401132 Primary open-angle glaucoma, bilateral, moderate stage: Secondary | ICD-10-CM | POA: Diagnosis not present

## 2020-04-09 DIAGNOSIS — Z6839 Body mass index (BMI) 39.0-39.9, adult: Secondary | ICD-10-CM | POA: Diagnosis not present

## 2020-04-09 DIAGNOSIS — I872 Venous insufficiency (chronic) (peripheral): Secondary | ICD-10-CM | POA: Diagnosis not present

## 2020-04-09 DIAGNOSIS — H3530 Unspecified macular degeneration: Secondary | ICD-10-CM | POA: Diagnosis not present

## 2020-04-09 DIAGNOSIS — M069 Rheumatoid arthritis, unspecified: Secondary | ICD-10-CM | POA: Diagnosis not present

## 2020-04-09 DIAGNOSIS — K219 Gastro-esophageal reflux disease without esophagitis: Secondary | ICD-10-CM | POA: Diagnosis not present

## 2020-04-09 DIAGNOSIS — G4733 Obstructive sleep apnea (adult) (pediatric): Secondary | ICD-10-CM | POA: Diagnosis not present

## 2020-04-09 DIAGNOSIS — E669 Obesity, unspecified: Secondary | ICD-10-CM | POA: Diagnosis not present

## 2020-04-11 DIAGNOSIS — G4733 Obstructive sleep apnea (adult) (pediatric): Secondary | ICD-10-CM | POA: Diagnosis not present

## 2020-04-11 DIAGNOSIS — M069 Rheumatoid arthritis, unspecified: Secondary | ICD-10-CM | POA: Diagnosis not present

## 2020-04-11 DIAGNOSIS — T84061D Wear of articular bearing surface of internal prosthetic left hip joint, subsequent encounter: Secondary | ICD-10-CM | POA: Diagnosis not present

## 2020-04-11 DIAGNOSIS — E669 Obesity, unspecified: Secondary | ICD-10-CM | POA: Diagnosis not present

## 2020-04-11 DIAGNOSIS — Z6839 Body mass index (BMI) 39.0-39.9, adult: Secondary | ICD-10-CM | POA: Diagnosis not present

## 2020-04-11 DIAGNOSIS — I890 Lymphedema, not elsewhere classified: Secondary | ICD-10-CM | POA: Diagnosis not present

## 2020-04-11 DIAGNOSIS — E559 Vitamin D deficiency, unspecified: Secondary | ICD-10-CM | POA: Diagnosis not present

## 2020-04-11 DIAGNOSIS — H3530 Unspecified macular degeneration: Secondary | ICD-10-CM | POA: Diagnosis not present

## 2020-04-11 DIAGNOSIS — Z87891 Personal history of nicotine dependence: Secondary | ICD-10-CM | POA: Diagnosis not present

## 2020-04-11 DIAGNOSIS — H353222 Exudative age-related macular degeneration, left eye, with inactive choroidal neovascularization: Secondary | ICD-10-CM | POA: Diagnosis not present

## 2020-04-11 DIAGNOSIS — D899 Disorder involving the immune mechanism, unspecified: Secondary | ICD-10-CM | POA: Diagnosis not present

## 2020-04-11 DIAGNOSIS — K219 Gastro-esophageal reflux disease without esophagitis: Secondary | ICD-10-CM | POA: Diagnosis not present

## 2020-04-11 DIAGNOSIS — R69 Illness, unspecified: Secondary | ICD-10-CM | POA: Diagnosis not present

## 2020-04-11 DIAGNOSIS — M19042 Primary osteoarthritis, left hand: Secondary | ICD-10-CM | POA: Diagnosis not present

## 2020-04-11 DIAGNOSIS — M19041 Primary osteoarthritis, right hand: Secondary | ICD-10-CM | POA: Diagnosis not present

## 2020-04-11 DIAGNOSIS — I872 Venous insufficiency (chronic) (peripheral): Secondary | ICD-10-CM | POA: Diagnosis not present

## 2020-04-11 DIAGNOSIS — H401132 Primary open-angle glaucoma, bilateral, moderate stage: Secondary | ICD-10-CM | POA: Diagnosis not present

## 2020-04-11 DIAGNOSIS — I10 Essential (primary) hypertension: Secondary | ICD-10-CM | POA: Diagnosis not present

## 2020-04-12 DIAGNOSIS — G4733 Obstructive sleep apnea (adult) (pediatric): Secondary | ICD-10-CM | POA: Diagnosis not present

## 2020-04-13 DIAGNOSIS — E559 Vitamin D deficiency, unspecified: Secondary | ICD-10-CM | POA: Diagnosis not present

## 2020-04-13 DIAGNOSIS — T84061D Wear of articular bearing surface of internal prosthetic left hip joint, subsequent encounter: Secondary | ICD-10-CM | POA: Diagnosis not present

## 2020-04-13 DIAGNOSIS — H401132 Primary open-angle glaucoma, bilateral, moderate stage: Secondary | ICD-10-CM | POA: Diagnosis not present

## 2020-04-13 DIAGNOSIS — M069 Rheumatoid arthritis, unspecified: Secondary | ICD-10-CM | POA: Diagnosis not present

## 2020-04-13 DIAGNOSIS — Z6839 Body mass index (BMI) 39.0-39.9, adult: Secondary | ICD-10-CM | POA: Diagnosis not present

## 2020-04-13 DIAGNOSIS — E669 Obesity, unspecified: Secondary | ICD-10-CM | POA: Diagnosis not present

## 2020-04-13 DIAGNOSIS — R69 Illness, unspecified: Secondary | ICD-10-CM | POA: Diagnosis not present

## 2020-04-13 DIAGNOSIS — H3530 Unspecified macular degeneration: Secondary | ICD-10-CM | POA: Diagnosis not present

## 2020-04-13 DIAGNOSIS — M19041 Primary osteoarthritis, right hand: Secondary | ICD-10-CM | POA: Diagnosis not present

## 2020-04-13 DIAGNOSIS — K219 Gastro-esophageal reflux disease without esophagitis: Secondary | ICD-10-CM | POA: Diagnosis not present

## 2020-04-13 DIAGNOSIS — M19042 Primary osteoarthritis, left hand: Secondary | ICD-10-CM | POA: Diagnosis not present

## 2020-04-13 DIAGNOSIS — I872 Venous insufficiency (chronic) (peripheral): Secondary | ICD-10-CM | POA: Diagnosis not present

## 2020-04-13 DIAGNOSIS — H353222 Exudative age-related macular degeneration, left eye, with inactive choroidal neovascularization: Secondary | ICD-10-CM | POA: Diagnosis not present

## 2020-04-13 DIAGNOSIS — Z87891 Personal history of nicotine dependence: Secondary | ICD-10-CM | POA: Diagnosis not present

## 2020-04-13 DIAGNOSIS — I890 Lymphedema, not elsewhere classified: Secondary | ICD-10-CM | POA: Diagnosis not present

## 2020-04-13 DIAGNOSIS — D899 Disorder involving the immune mechanism, unspecified: Secondary | ICD-10-CM | POA: Diagnosis not present

## 2020-04-13 DIAGNOSIS — G4733 Obstructive sleep apnea (adult) (pediatric): Secondary | ICD-10-CM | POA: Diagnosis not present

## 2020-04-13 DIAGNOSIS — I10 Essential (primary) hypertension: Secondary | ICD-10-CM | POA: Diagnosis not present

## 2020-04-14 ENCOUNTER — Ambulatory Visit (INDEPENDENT_AMBULATORY_CARE_PROVIDER_SITE_OTHER): Payer: Medicare HMO | Admitting: Orthopaedic Surgery

## 2020-04-14 ENCOUNTER — Encounter: Payer: Self-pay | Admitting: Orthopaedic Surgery

## 2020-04-14 DIAGNOSIS — Z96649 Presence of unspecified artificial hip joint: Secondary | ICD-10-CM

## 2020-04-14 DIAGNOSIS — Z96642 Presence of left artificial hip joint: Secondary | ICD-10-CM

## 2020-04-14 DIAGNOSIS — T84068D Wear of articular bearing surface of other internal prosthetic joint, subsequent encounter: Secondary | ICD-10-CM

## 2020-04-14 NOTE — Progress Notes (Signed)
The patient is 2 weeks tomorrow status post a polyliner exchange from her left hip and hip ball exchange.  This was a hip that has been in for 30 years.  The liner has worn down.  Fortunately the metal components of the acetabulum and femoral component were intact.  She has been on a baby aspirin twice a day.  She has no complaints.  The staples were removed and Steri-Strips applied from the left hip.  She does have a very large seroma.  She has a large lady as well.  I was able to aspirate about 160 cc of fluid of her hip area.  I did let her know this would probably reaccumulate some.  She should go back to just once a day aspirin and I would like to see her back in 2 weeks to see if we need to aspirate her again.  Overall though she is doing well and has no other issues she states.

## 2020-04-20 DIAGNOSIS — K219 Gastro-esophageal reflux disease without esophagitis: Secondary | ICD-10-CM | POA: Diagnosis not present

## 2020-04-20 DIAGNOSIS — H353222 Exudative age-related macular degeneration, left eye, with inactive choroidal neovascularization: Secondary | ICD-10-CM | POA: Diagnosis not present

## 2020-04-20 DIAGNOSIS — M19042 Primary osteoarthritis, left hand: Secondary | ICD-10-CM | POA: Diagnosis not present

## 2020-04-20 DIAGNOSIS — E669 Obesity, unspecified: Secondary | ICD-10-CM | POA: Diagnosis not present

## 2020-04-20 DIAGNOSIS — I10 Essential (primary) hypertension: Secondary | ICD-10-CM | POA: Diagnosis not present

## 2020-04-20 DIAGNOSIS — Z6839 Body mass index (BMI) 39.0-39.9, adult: Secondary | ICD-10-CM | POA: Diagnosis not present

## 2020-04-20 DIAGNOSIS — I890 Lymphedema, not elsewhere classified: Secondary | ICD-10-CM | POA: Diagnosis not present

## 2020-04-20 DIAGNOSIS — M069 Rheumatoid arthritis, unspecified: Secondary | ICD-10-CM | POA: Diagnosis not present

## 2020-04-20 DIAGNOSIS — T84061D Wear of articular bearing surface of internal prosthetic left hip joint, subsequent encounter: Secondary | ICD-10-CM | POA: Diagnosis not present

## 2020-04-20 DIAGNOSIS — E559 Vitamin D deficiency, unspecified: Secondary | ICD-10-CM | POA: Diagnosis not present

## 2020-04-20 DIAGNOSIS — H3530 Unspecified macular degeneration: Secondary | ICD-10-CM | POA: Diagnosis not present

## 2020-04-20 DIAGNOSIS — G4733 Obstructive sleep apnea (adult) (pediatric): Secondary | ICD-10-CM | POA: Diagnosis not present

## 2020-04-20 DIAGNOSIS — M19041 Primary osteoarthritis, right hand: Secondary | ICD-10-CM | POA: Diagnosis not present

## 2020-04-20 DIAGNOSIS — D899 Disorder involving the immune mechanism, unspecified: Secondary | ICD-10-CM | POA: Diagnosis not present

## 2020-04-20 DIAGNOSIS — Z87891 Personal history of nicotine dependence: Secondary | ICD-10-CM | POA: Diagnosis not present

## 2020-04-20 DIAGNOSIS — R69 Illness, unspecified: Secondary | ICD-10-CM | POA: Diagnosis not present

## 2020-04-20 DIAGNOSIS — I872 Venous insufficiency (chronic) (peripheral): Secondary | ICD-10-CM | POA: Diagnosis not present

## 2020-04-20 DIAGNOSIS — H401132 Primary open-angle glaucoma, bilateral, moderate stage: Secondary | ICD-10-CM | POA: Diagnosis not present

## 2020-04-22 DIAGNOSIS — E669 Obesity, unspecified: Secondary | ICD-10-CM | POA: Diagnosis not present

## 2020-04-22 DIAGNOSIS — M19042 Primary osteoarthritis, left hand: Secondary | ICD-10-CM | POA: Diagnosis not present

## 2020-04-22 DIAGNOSIS — Z6839 Body mass index (BMI) 39.0-39.9, adult: Secondary | ICD-10-CM | POA: Diagnosis not present

## 2020-04-22 DIAGNOSIS — H401132 Primary open-angle glaucoma, bilateral, moderate stage: Secondary | ICD-10-CM | POA: Diagnosis not present

## 2020-04-22 DIAGNOSIS — Z87891 Personal history of nicotine dependence: Secondary | ICD-10-CM | POA: Diagnosis not present

## 2020-04-22 DIAGNOSIS — R69 Illness, unspecified: Secondary | ICD-10-CM | POA: Diagnosis not present

## 2020-04-22 DIAGNOSIS — I872 Venous insufficiency (chronic) (peripheral): Secondary | ICD-10-CM | POA: Diagnosis not present

## 2020-04-22 DIAGNOSIS — M19041 Primary osteoarthritis, right hand: Secondary | ICD-10-CM | POA: Diagnosis not present

## 2020-04-22 DIAGNOSIS — M069 Rheumatoid arthritis, unspecified: Secondary | ICD-10-CM | POA: Diagnosis not present

## 2020-04-22 DIAGNOSIS — I10 Essential (primary) hypertension: Secondary | ICD-10-CM | POA: Diagnosis not present

## 2020-04-22 DIAGNOSIS — D899 Disorder involving the immune mechanism, unspecified: Secondary | ICD-10-CM | POA: Diagnosis not present

## 2020-04-22 DIAGNOSIS — I890 Lymphedema, not elsewhere classified: Secondary | ICD-10-CM | POA: Diagnosis not present

## 2020-04-22 DIAGNOSIS — T84061D Wear of articular bearing surface of internal prosthetic left hip joint, subsequent encounter: Secondary | ICD-10-CM | POA: Diagnosis not present

## 2020-04-22 DIAGNOSIS — E559 Vitamin D deficiency, unspecified: Secondary | ICD-10-CM | POA: Diagnosis not present

## 2020-04-22 DIAGNOSIS — G4733 Obstructive sleep apnea (adult) (pediatric): Secondary | ICD-10-CM | POA: Diagnosis not present

## 2020-04-22 DIAGNOSIS — H3530 Unspecified macular degeneration: Secondary | ICD-10-CM | POA: Diagnosis not present

## 2020-04-22 DIAGNOSIS — H353222 Exudative age-related macular degeneration, left eye, with inactive choroidal neovascularization: Secondary | ICD-10-CM | POA: Diagnosis not present

## 2020-04-22 DIAGNOSIS — K219 Gastro-esophageal reflux disease without esophagitis: Secondary | ICD-10-CM | POA: Diagnosis not present

## 2020-04-25 DIAGNOSIS — H401132 Primary open-angle glaucoma, bilateral, moderate stage: Secondary | ICD-10-CM | POA: Diagnosis not present

## 2020-04-25 DIAGNOSIS — E669 Obesity, unspecified: Secondary | ICD-10-CM | POA: Diagnosis not present

## 2020-04-25 DIAGNOSIS — I872 Venous insufficiency (chronic) (peripheral): Secondary | ICD-10-CM | POA: Diagnosis not present

## 2020-04-25 DIAGNOSIS — H3530 Unspecified macular degeneration: Secondary | ICD-10-CM | POA: Diagnosis not present

## 2020-04-25 DIAGNOSIS — Z87891 Personal history of nicotine dependence: Secondary | ICD-10-CM | POA: Diagnosis not present

## 2020-04-25 DIAGNOSIS — R69 Illness, unspecified: Secondary | ICD-10-CM | POA: Diagnosis not present

## 2020-04-25 DIAGNOSIS — D899 Disorder involving the immune mechanism, unspecified: Secondary | ICD-10-CM | POA: Diagnosis not present

## 2020-04-25 DIAGNOSIS — H353222 Exudative age-related macular degeneration, left eye, with inactive choroidal neovascularization: Secondary | ICD-10-CM | POA: Diagnosis not present

## 2020-04-25 DIAGNOSIS — T84061D Wear of articular bearing surface of internal prosthetic left hip joint, subsequent encounter: Secondary | ICD-10-CM | POA: Diagnosis not present

## 2020-04-25 DIAGNOSIS — I10 Essential (primary) hypertension: Secondary | ICD-10-CM | POA: Diagnosis not present

## 2020-04-25 DIAGNOSIS — M19041 Primary osteoarthritis, right hand: Secondary | ICD-10-CM | POA: Diagnosis not present

## 2020-04-25 DIAGNOSIS — G4733 Obstructive sleep apnea (adult) (pediatric): Secondary | ICD-10-CM | POA: Diagnosis not present

## 2020-04-25 DIAGNOSIS — M069 Rheumatoid arthritis, unspecified: Secondary | ICD-10-CM | POA: Diagnosis not present

## 2020-04-25 DIAGNOSIS — M19042 Primary osteoarthritis, left hand: Secondary | ICD-10-CM | POA: Diagnosis not present

## 2020-04-25 DIAGNOSIS — I890 Lymphedema, not elsewhere classified: Secondary | ICD-10-CM | POA: Diagnosis not present

## 2020-04-25 DIAGNOSIS — E559 Vitamin D deficiency, unspecified: Secondary | ICD-10-CM | POA: Diagnosis not present

## 2020-04-25 DIAGNOSIS — Z6839 Body mass index (BMI) 39.0-39.9, adult: Secondary | ICD-10-CM | POA: Diagnosis not present

## 2020-04-25 DIAGNOSIS — K219 Gastro-esophageal reflux disease without esophagitis: Secondary | ICD-10-CM | POA: Diagnosis not present

## 2020-04-27 ENCOUNTER — Ambulatory Visit: Payer: Medicare HMO | Admitting: Specialist

## 2020-04-27 DIAGNOSIS — I890 Lymphedema, not elsewhere classified: Secondary | ICD-10-CM | POA: Diagnosis not present

## 2020-04-27 DIAGNOSIS — G4733 Obstructive sleep apnea (adult) (pediatric): Secondary | ICD-10-CM | POA: Diagnosis not present

## 2020-04-27 DIAGNOSIS — H353222 Exudative age-related macular degeneration, left eye, with inactive choroidal neovascularization: Secondary | ICD-10-CM | POA: Diagnosis not present

## 2020-04-27 DIAGNOSIS — R69 Illness, unspecified: Secondary | ICD-10-CM | POA: Diagnosis not present

## 2020-04-27 DIAGNOSIS — H3530 Unspecified macular degeneration: Secondary | ICD-10-CM | POA: Diagnosis not present

## 2020-04-27 DIAGNOSIS — Z6839 Body mass index (BMI) 39.0-39.9, adult: Secondary | ICD-10-CM | POA: Diagnosis not present

## 2020-04-27 DIAGNOSIS — K219 Gastro-esophageal reflux disease without esophagitis: Secondary | ICD-10-CM | POA: Diagnosis not present

## 2020-04-27 DIAGNOSIS — M19041 Primary osteoarthritis, right hand: Secondary | ICD-10-CM | POA: Diagnosis not present

## 2020-04-27 DIAGNOSIS — T84061D Wear of articular bearing surface of internal prosthetic left hip joint, subsequent encounter: Secondary | ICD-10-CM | POA: Diagnosis not present

## 2020-04-27 DIAGNOSIS — E669 Obesity, unspecified: Secondary | ICD-10-CM | POA: Diagnosis not present

## 2020-04-27 DIAGNOSIS — H401132 Primary open-angle glaucoma, bilateral, moderate stage: Secondary | ICD-10-CM | POA: Diagnosis not present

## 2020-04-27 DIAGNOSIS — I872 Venous insufficiency (chronic) (peripheral): Secondary | ICD-10-CM | POA: Diagnosis not present

## 2020-04-27 DIAGNOSIS — E559 Vitamin D deficiency, unspecified: Secondary | ICD-10-CM | POA: Diagnosis not present

## 2020-04-27 DIAGNOSIS — Z87891 Personal history of nicotine dependence: Secondary | ICD-10-CM | POA: Diagnosis not present

## 2020-04-27 DIAGNOSIS — M069 Rheumatoid arthritis, unspecified: Secondary | ICD-10-CM | POA: Diagnosis not present

## 2020-04-27 DIAGNOSIS — I10 Essential (primary) hypertension: Secondary | ICD-10-CM | POA: Diagnosis not present

## 2020-04-27 DIAGNOSIS — M19042 Primary osteoarthritis, left hand: Secondary | ICD-10-CM | POA: Diagnosis not present

## 2020-04-27 DIAGNOSIS — D899 Disorder involving the immune mechanism, unspecified: Secondary | ICD-10-CM | POA: Diagnosis not present

## 2020-04-28 ENCOUNTER — Encounter: Payer: Self-pay | Admitting: Physician Assistant

## 2020-04-28 ENCOUNTER — Ambulatory Visit (INDEPENDENT_AMBULATORY_CARE_PROVIDER_SITE_OTHER): Payer: Medicare HMO | Admitting: Physician Assistant

## 2020-04-28 DIAGNOSIS — Z96649 Presence of unspecified artificial hip joint: Secondary | ICD-10-CM

## 2020-04-28 DIAGNOSIS — T84068D Wear of articular bearing surface of other internal prosthetic joint, subsequent encounter: Secondary | ICD-10-CM

## 2020-04-28 MED ORDER — CLINDAMYCIN HCL 150 MG PO CAPS: ORAL_CAPSULE | ORAL | 0 refills | Status: DC

## 2020-04-28 NOTE — Progress Notes (Addendum)
HPI: Taylor Mccall returns today status post left hip polyliner exchange.  She states she is doing well.  She has developed some fluid again.  She is taking only Aleve for pain she is in no system utilized to ambulate.  Physical exam: Left hip surgical incisions healing well no signs of infection.  Positive seroma.  Calf supple nontender.  Dorsiflexion plantarflexion left ankle intact. Left hip is aspirated today 45 cc of serosanguineous fluids aspirated patient tolerates well.  Impression: 4 weeks status post left hip polyliner exchange.  Plan: We will see her back in 2 weeks to see how she is doing overall and see if she needs possible seroma aspiration.  She can follow-up sooner if there is any questions concerns.  Scar tissue mobilization encouraged.  Have her wash the incision with an antibacterial soap and apply Vaseline over the incision site.

## 2020-05-05 DIAGNOSIS — H401132 Primary open-angle glaucoma, bilateral, moderate stage: Secondary | ICD-10-CM | POA: Diagnosis not present

## 2020-05-05 DIAGNOSIS — M069 Rheumatoid arthritis, unspecified: Secondary | ICD-10-CM | POA: Diagnosis not present

## 2020-05-05 DIAGNOSIS — Z79899 Other long term (current) drug therapy: Secondary | ICD-10-CM | POA: Diagnosis not present

## 2020-05-05 DIAGNOSIS — H353112 Nonexudative age-related macular degeneration, right eye, intermediate dry stage: Secondary | ICD-10-CM | POA: Diagnosis not present

## 2020-05-11 ENCOUNTER — Encounter: Payer: Self-pay | Admitting: Orthopaedic Surgery

## 2020-05-11 ENCOUNTER — Ambulatory Visit (INDEPENDENT_AMBULATORY_CARE_PROVIDER_SITE_OTHER): Payer: Medicare HMO | Admitting: Orthopaedic Surgery

## 2020-05-11 DIAGNOSIS — Z96649 Presence of unspecified artificial hip joint: Secondary | ICD-10-CM

## 2020-05-11 NOTE — Progress Notes (Signed)
HPI: Mrs. Calvo returns today 6 weeks status post left hip revision.  She states overall she is improving.  She uses a cane just for stability but sometimes ambulates without it.  She states that the seroma lateral aspect of the hip is possibly still present but not as large as it was.  No complaints otherwise.  Physical exam: Left hip good range of motion without pain.  Calf supple nontender.  Surgical incisions healing well no signs of infection.  No significant seroma.  No abnormal warmth erythema or ecchymosis about the hip.  Ambulates with the use of a cane right hand.  Is able to get on and off the exam table easily on her own.  Impression: Status post left hip polyliner exchange  Plan: Recommend she continue to work on scar tissue mobilization.  Not felt that she has a significant seroma today therefore is not aspirated.  She will follow-up with [place] in 3 months at that time would like an AP pelvis and lateral view of the left hip.  Questions were encouraged and answered at length today by Dr. Ninfa Linden and myself.

## 2020-05-17 DIAGNOSIS — G4733 Obstructive sleep apnea (adult) (pediatric): Secondary | ICD-10-CM | POA: Diagnosis not present

## 2020-05-28 DIAGNOSIS — H409 Unspecified glaucoma: Secondary | ICD-10-CM | POA: Diagnosis not present

## 2020-05-28 DIAGNOSIS — M069 Rheumatoid arthritis, unspecified: Secondary | ICD-10-CM | POA: Diagnosis not present

## 2020-05-28 DIAGNOSIS — J45909 Unspecified asthma, uncomplicated: Secondary | ICD-10-CM | POA: Diagnosis not present

## 2020-05-28 DIAGNOSIS — R69 Illness, unspecified: Secondary | ICD-10-CM | POA: Diagnosis not present

## 2020-05-28 DIAGNOSIS — Z008 Encounter for other general examination: Secondary | ICD-10-CM | POA: Diagnosis not present

## 2020-05-28 DIAGNOSIS — I10 Essential (primary) hypertension: Secondary | ICD-10-CM | POA: Diagnosis not present

## 2020-05-28 DIAGNOSIS — I739 Peripheral vascular disease, unspecified: Secondary | ICD-10-CM | POA: Diagnosis not present

## 2020-05-28 DIAGNOSIS — G4733 Obstructive sleep apnea (adult) (pediatric): Secondary | ICD-10-CM | POA: Diagnosis not present

## 2020-05-28 DIAGNOSIS — G8929 Other chronic pain: Secondary | ICD-10-CM | POA: Diagnosis not present

## 2020-06-16 DIAGNOSIS — G4733 Obstructive sleep apnea (adult) (pediatric): Secondary | ICD-10-CM | POA: Diagnosis not present

## 2020-07-01 ENCOUNTER — Other Ambulatory Visit: Payer: Self-pay | Admitting: Adult Health

## 2020-07-01 DIAGNOSIS — Z0000 Encounter for general adult medical examination without abnormal findings: Secondary | ICD-10-CM

## 2020-07-01 DIAGNOSIS — I10 Essential (primary) hypertension: Secondary | ICD-10-CM

## 2020-07-01 DIAGNOSIS — K219 Gastro-esophageal reflux disease without esophagitis: Secondary | ICD-10-CM

## 2020-07-05 DIAGNOSIS — H401132 Primary open-angle glaucoma, bilateral, moderate stage: Secondary | ICD-10-CM | POA: Diagnosis not present

## 2020-07-05 DIAGNOSIS — H04123 Dry eye syndrome of bilateral lacrimal glands: Secondary | ICD-10-CM | POA: Diagnosis not present

## 2020-07-12 NOTE — Progress Notes (Signed)
Office Visit Note  Patient: Taylor Mccall             Date of Birth: [birth year]           MRN: [id number]             PCP: Dorothyann Peng, NP Referring: Dorothyann Peng, NP Visit Date: 07/26/2020 Occupation: @GUAROCC @  Subjective:  Medical management.   History of Present Illness: Taylor Mccall is a 75 y.o. female with a history of autoimmune disease, osteoarthritis and degenerative disc disease.  She underwent left hip revision on April 01, 2020.  She states she is gradually recovering from that.  She has been using a cane.  She states for the last few months she has been having increased pain and discomfort in her left shoulder joint.  She has difficulty raising her left arm.  Right shoulder joint is better.  She continues to have a lot of edema in her bilateral lower extremities.  She states she has to prop up her legs.  She continues to have discomfort in her neck and lower back.  She has an appointment coming up with Dr. Louanne Skye this month.  She denies any history of oral ulcers, nasal ulcers, malar rash, photosensitivity, Raynaud's phenomenon or lymphadenopathy.  Activities of Daily Living:  Patient reports morning stiffness for several hours.   Patient Reports nocturnal pain.  Difficulty dressing/grooming: Denies Difficulty climbing stairs: Denies Difficulty getting out of chair: Reports Difficulty using hands for taps, buttons, cutlery, and/or writing: Reports  Review of Systems  Constitutional:  Positive for fatigue.  HENT:  Positive for mouth dryness. Negative for mouth sores and nose dryness.   Eyes:  Positive for itching and dryness. Negative for pain.  Respiratory:  Negative for shortness of breath and difficulty breathing.   Cardiovascular:  Negative for chest pain and palpitations.  Gastrointestinal:  Negative for blood in stool, constipation and diarrhea.  Endocrine: Positive for increased urination.  Genitourinary:  Negative for difficulty urinating and  painful urination.  Musculoskeletal:  Positive for joint pain, joint pain, joint swelling, myalgias, morning stiffness, muscle tenderness and myalgias.  Skin:  Negative for color change, rash and redness.  Allergic/Immunologic: Negative for susceptible to infections.  Neurological:  Positive for numbness and parasthesias. Negative for dizziness, headaches, memory loss and weakness.  Hematological:  Positive for bruising/bleeding tendency.  Psychiatric/Behavioral:  Negative for confusion.    PMFS History:  Patient Active Problem List   Diagnosis Date Noted   Status post revision of total hip 04/01/2020   Polyethylene liner wear following total hip arthroplasty requiring isolated polyethylene liner exchange ([place]) 03/31/2020   Intermediate stage nonexudative age-related macular degeneration of right eye 12/23/2019   Exudative age-related macular degeneration of left eye with inactive choroidal neovascularization ([place]) 12/23/2019   Primary open angle glaucoma of both eyes, moderate stage 12/23/2019   Exudative retinopathy of left eye 12/23/2019   Routine general medical examination at a health care facility 12/11/2016   Primary osteoarthritis of left hip 05/01/2016   Autoimmune disease ([place]) 11/29/2015   High risk medication use 11/29/2015   Vitamin D deficiency 11/29/2015   Macular degeneration 11/29/2015   Bilateral lower extremity edema 10/26/2015   H/O total knee replacement, bilateral 12/09/2013   Breast mass, left 01/18/2011   Obesity 12/08/2009   Venous (peripheral) insufficiency 07/28/2009   PAIN IN JOINT, ANKLE AND FOOT 01/12/2008   Obstructive sleep apnea 08/29/2007   GAIT DISTURBANCE 08/14/2007   Depression 08/26/2006   Essential hypertension  08/26/2006   Primary osteoarthritis of both hands 08/26/2006   Urinary incontinence 08/26/2006    Past Medical History:  Diagnosis Date   Anxiety    Blood transfusion without reported diagnosis    Breast mass, left    Cataract     Depression    Difficult intubation 04/01/2020   Known difficult airway from previous records   Gait disturbance    GERD (gastroesophageal reflux disease)    Hypertension    Lymphedema of both lower extremities    Seeing OT at [place] [facility] to Left Lower leg   Obesity    Osteoarthritis    Rheumatoid arthritis(714.0)    Sleep apnea    cpap   Urinary incontinence    Venous insufficiency     Family History  Problem Relation Age of Onset   Diabetes Other    Stroke Other    Asthma Brother    Heart disease Brother    Coronary artery disease Father    Skin cancer Father    Heart disease Father    Coronary artery disease Brother    Colon cancer Neg Hx    Esophageal cancer Neg Hx    Rectal cancer Neg Hx    Stomach cancer Neg Hx    Past Surgical History:  Procedure Laterality Date   ABDOMINAL HYSTERECTOMY     ANTERIOR HIP REVISION Left 04/01/2020   Procedure: ANTERIOR LEFT HIP REVISION OF ACETABULAR COMPONENT;  Surgeon: Mcarthur Rossetti, MD;  Location: [facility];  Service: Orthopedics;  Laterality: Left;  3E   BLADDER SURGERY     sling   BLADDER SUSPENSION  2009   BREAST REDUCTION SURGERY     COLONOSCOPY     ESOPHAGOGASTRODUODENOSCOPY     EYE SURGERY     REPLACEMENT TOTAL KNEE Left    TOE SURGERY Left    Left great toe   toenail removal Right    all 5 toenails   TOTAL HIP ARTHROPLASTY     TOTAL KNEE ARTHROPLASTY Right 12/09/2013   Procedure: Right Total Knee Arthroplasty;  Surgeon: Newt Minion, MD;  Location: [facility];  Service: Orthopedics;  Laterality: Right;   VESICOVAGINAL FISTULA CLOSURE W/ TAH     Social History   Social History Narrative   Not on file   Immunization History  Administered Date(s) Administered   Influenza Split 12/14/2010, 12/06/2012   Influenza Whole 02/06/2004, 12/08/2009   Influenza, High Dose Seasonal PF 10/26/2015, 12/11/2016, 01/06/2018, 10/07/2018   Influenza,inj,Quad PF,6+ Mos 10/15/2013, 11/19/2014    Influenza-Unspecified 10/07/2018   PFIZER(Purple Top)SARS-COV-2 Vaccination 03/01/2019, 03/21/2019, 10/06/2019   Pneumococcal Conjugate-13 03/18/2013   Pneumococcal Polysaccharide-23 06/30/2008, 10/26/2015   Td 02/05/2005   Tdap 12/14/2010     Objective: Vital Signs: BP 111/75 (BP Location: Left Arm, Patient Position: Sitting, Cuff Size: Normal)   Pulse 78   Resp 17   Ht 5\' 10"  (1.778 m)   Wt 258 lb 12.8 oz (117.4 kg)   BMI 37.13 kg/m    Physical Exam Vitals and nursing note reviewed.  Constitutional:      Appearance: She is well-developed.  HENT:     Head: Normocephalic and atraumatic.  Eyes:     Conjunctiva/sclera: Conjunctivae normal.  Cardiovascular:     Rate and Rhythm: Normal rate and regular rhythm.     Heart sounds: Normal heart sounds.  Pulmonary:     Effort: Pulmonary effort is normal.     Breath sounds: Normal breath sounds.  Abdominal:  General: Bowel sounds are normal.     Palpations: Abdomen is soft.  Musculoskeletal:     Cervical back: Normal range of motion.     Right lower leg: Edema present.     Left lower leg: Edema present.  Lymphadenopathy:     Cervical: No cervical adenopathy.  Skin:    General: Skin is warm and dry.     Capillary Refill: Capillary refill takes less than 2 seconds.  Neurological:     Mental Status: She is alert and oriented to person, place, and time.  Psychiatric:        Behavior: Behavior normal.     Musculoskeletal Exam: Patient had limited range of motion of her cervical spine.  She has some discomfort with lateral rotation.  She has painful limited range of motion of her lumbar spine.  She had painful limited range of motion of her left shoulder joint especially with abduction and internal rotation.  Right shoulder joint was in full range of motion.  Elbow joints and wrist joints with good range of motion.  She had PIP and DIP thickening with subluxation of some of the PIP and DIP joints in her right hand.  No synovitis  was noted.  Left hip joint was recently replaced and had good range of motion.  She continues to have some warmth in her knee joints.  Both knee joints are replaced.  She has significant pedal edema in her bilateral lower extremities.  No synovitis was noted.  CDAI Exam: CDAI Score: -- Patient Global: --; Provider Global: -- Swollen: --; Tender: -- Joint Exam 07/26/2020   No joint exam has been documented for this visit   There is currently no information documented on the homunculus. Go to the Rheumatology activity and complete the homunculus joint exam.  Investigation: No additional findings.  Imaging: No results found.  Recent Labs: Lab Results  Component Value Date   WBC 9.1 04/02/2020   HGB 9.9 (L) 04/02/2020   PLT 225 04/02/2020   NA 141 04/02/2020   K 4.0 04/02/2020   CL 108 04/02/2020   CO2 26 04/02/2020   GLUCOSE 118 (H) 04/02/2020   BUN 14 04/02/2020   CREATININE 0.72 04/02/2020   BILITOT 0.8 03/01/2020   ALKPHOS 96 07/25/2019   AST 15 03/01/2020   ALT 11 03/01/2020   PROT 7.1 03/01/2020   ALBUMIN 3.7 07/25/2019   CALCIUM 8.5 (L) 04/02/2020   GFRAA 84 03/01/2020    Speciality Comments: PLQ eye exam: 10/20/2019 WNL @ [facility]. Follow up in 6 months.  Procedures:  Large Joint Inj: L glenohumeral on 07/26/2020 10:06 AM Indications: pain Details: 27 G 1.5 in needle, posterior approach  Arthrogram: No  Medications: 2 mL lidocaine 1 %; 40 mg triamcinolone acetonide 40 MG/ML Aspirate: 0 mL Outcome: tolerated well, no immediate complications Procedure, treatment alternatives, risks and benefits explained, specific risks discussed. Consent was given by the patient. Immediately prior to procedure a time out was called to verify the correct patient, procedure, equipment, support staff and site/side marked as required. Patient was prepped and draped in the usual sterile fashion.    Allergies: Penicillins   Assessment / Plan:     Visit Diagnoses:  Autoimmune disease ([place]) - +ANA, +RNP:   -She had no synovitis on examination.  She has been told in Plaquenil well.  She denies any history of oral ulcers, nasal ulcers, malar rash, photosensitivity, Raynaud's phenomenon or lymphadenopathy.  She continues to have some sicca symptoms.  Plan:  Anti-DNA antibody, double-stranded, C3 and C4, Sedimentation rate, Urinalysis, Routine w reflex microscopic  High risk medication use - Plaquenil 200 mg 1 tablet by mouth twice daily. PLQ eye exam: 10/20/2019 - Plan: CBC with Differential/Platelet, COMPLETE METABOLIC PANEL WITH GFR  Chronic left shoulder pain - She had a right shoulder joint cortisone injection on 09/04/2018 which provided significant pain relief.  She had no recurrence of right shoulder joint pain.  She has been having increased left shoulder pain now.  She had discomfort with abduction and internal rotation.- Plan: XR Shoulder Left.  X-ray showed acromioclavicular arthritis.  X-ray findings were discussed with the patient.  She was having significant discomfort after informed consent was obtained left shoulder joint was injected with cortisone as described above.  She tolerated the procedure well.  A handout on shoulder joint exercises was given.  She declined physical therapy.  Primary osteoarthritis of both hands-she continues to have some discomfort in her hands.  No synovitis was noted.  Joint protection was discussed.  S/P total left hip arthroplasty - Revision 04/01/2020 by Dr. Ninfa Linden.  She did well after the hip replacement.  She also went for physical therapy which was helpful.  She had good range of motion.  H/O total knee replacement, bilateral-she continues to have some warmth and discomfort in her knee joints.  DDD (degenerative disc disease), cervical - X-rays of the C-spine were obtained on 09/29/2019 which revealed severe multilevel spondylosis and facet joint arthropathy.  She is followed by Dr. Louanne Skye.  She has limited range of  motion with lateral rotation.  DDD (degenerative disc disease), lumbar - x-rays of the lumbar spine 07/28/2019 which revealed levoscoliosis of the thoracolumbar spine, moderate disc degeneration and severe multilevel facet degenerative changes.  Other medical problems are listed as follows,  History of macular degeneration  Vitamin D deficiency  History of depression  History of sleep apnea  Pedal edema-she has significant bilateral pedal edema.  Orders: Orders Placed This Encounter  Procedures   XR Shoulder Left   CBC with Differential/Platelet   COMPLETE METABOLIC PANEL WITH GFR   Anti-DNA antibody, double-stranded   C3 and C4   Sedimentation rate   Urinalysis, Routine w reflex microscopic   No orders of the defined types were placed in this encounter.    Follow-Up Instructions: Return in about 5 months (around 12/26/2020) for Autoimmune disease.   Bo Merino, MD  Note - This record has been created using Editor, commissioning.  Chart creation errors have been sought, but may not always  have been located. Such creation errors do not reflect on  the standard of medical care.

## 2020-07-17 DIAGNOSIS — G4733 Obstructive sleep apnea (adult) (pediatric): Secondary | ICD-10-CM | POA: Diagnosis not present

## 2020-07-26 ENCOUNTER — Other Ambulatory Visit: Payer: Self-pay

## 2020-07-26 ENCOUNTER — Ambulatory Visit (INDEPENDENT_AMBULATORY_CARE_PROVIDER_SITE_OTHER): Payer: Medicare HMO | Admitting: Rheumatology

## 2020-07-26 ENCOUNTER — Ambulatory Visit: Payer: Self-pay

## 2020-07-26 ENCOUNTER — Encounter: Payer: Self-pay | Admitting: Rheumatology

## 2020-07-26 VITALS — BP 111/75 | HR 78 | Resp 17 | Ht 70.0 in | Wt 258.8 lb

## 2020-07-26 DIAGNOSIS — R600 Localized edema: Secondary | ICD-10-CM

## 2020-07-26 DIAGNOSIS — G8929 Other chronic pain: Secondary | ICD-10-CM

## 2020-07-26 DIAGNOSIS — M5030 Other cervical disc degeneration, unspecified cervical region: Secondary | ICD-10-CM

## 2020-07-26 DIAGNOSIS — Z96642 Presence of left artificial hip joint: Secondary | ICD-10-CM | POA: Diagnosis not present

## 2020-07-26 DIAGNOSIS — M25512 Pain in left shoulder: Secondary | ICD-10-CM

## 2020-07-26 DIAGNOSIS — M19042 Primary osteoarthritis, left hand: Secondary | ICD-10-CM

## 2020-07-26 DIAGNOSIS — Z8659 Personal history of other mental and behavioral disorders: Secondary | ICD-10-CM | POA: Diagnosis not present

## 2020-07-26 DIAGNOSIS — M5136 Other intervertebral disc degeneration, lumbar region: Secondary | ICD-10-CM | POA: Diagnosis not present

## 2020-07-26 DIAGNOSIS — M359 Systemic involvement of connective tissue, unspecified: Secondary | ICD-10-CM

## 2020-07-26 DIAGNOSIS — Z8669 Personal history of other diseases of the nervous system and sense organs: Secondary | ICD-10-CM | POA: Diagnosis not present

## 2020-07-26 DIAGNOSIS — Z79899 Other long term (current) drug therapy: Secondary | ICD-10-CM | POA: Diagnosis not present

## 2020-07-26 DIAGNOSIS — M19041 Primary osteoarthritis, right hand: Secondary | ICD-10-CM

## 2020-07-26 DIAGNOSIS — Z96653 Presence of artificial knee joint, bilateral: Secondary | ICD-10-CM | POA: Diagnosis not present

## 2020-07-26 DIAGNOSIS — E559 Vitamin D deficiency, unspecified: Secondary | ICD-10-CM | POA: Diagnosis not present

## 2020-07-26 MED ORDER — TRIAMCINOLONE ACETONIDE 40 MG/ML IJ SUSP
40.0000 mg | INTRAMUSCULAR | Status: AC | PRN
  Administered 2020-07-26: 40 mg via INTRA_ARTICULAR

## 2020-07-26 MED ORDER — LIDOCAINE HCL 1 % IJ SOLN
2.0000 mL | INTRAMUSCULAR | Status: AC | PRN
  Administered 2020-07-26: 2 mL

## 2020-07-26 MED ORDER — HYDROXYCHLOROQUINE SULFATE 200 MG PO TABS: 200.0000 mg | ORAL_TABLET | Freq: Two times a day (BID) | ORAL | 0 refills | Status: DC

## 2020-07-26 NOTE — Patient Instructions (Signed)
Shoulder Exercises Ask your health care provider which exercises are safe for you. Do exercises exactly as told by your health care provider and adjust them as directed. It is normal to feel mild stretching, pulling, tightness, or discomfort as you do these exercises. Stop right away if you feel sudden pain or your pain gets worse. Do not begin these exercises until told by your health care provider. Stretching exercises External rotation and abduction This exercise is sometimes called corner stretch. This exercise rotates your arm outward (external rotation) and moves your arm out from your body (abduction). Stand in a doorway with one of your feet slightly in front of the other. This is called a staggered stance. If you cannot reach your forearms to the door frame, stand facing a corner of a room. Choose one of the following positions as told by your health care provider: Place your hands and forearms on the door frame above your head. Place your hands and forearms on the door frame at the height of your head. Place your hands on the door frame at the height of your elbows. Slowly move your weight onto your front foot until you feel a stretch across your chest and in the front of your shoulders. Keep your head and chest upright and keep your abdominal muscles tight. Hold for __________ seconds. To release the stretch, shift your weight to your back foot. Repeat __________ times. Complete this exercise __________ times a day. Extension, standing Stand and hold a broomstick, a cane, or a similar object behind your back. Your hands should be a little wider than shoulder width apart. Your palms should face away from your back. Keeping your elbows straight and your shoulder muscles relaxed, move the stick away from your body until you feel a stretch in your shoulders (extension). Avoid shrugging your shoulders while you move the stick. Keep your shoulder blades tucked down toward the middle of your  back. Hold for __________ seconds. Slowly return to the starting position. Repeat __________ times. Complete this exercise __________ times a day. Range-of-motion exercises Pendulum  Stand near a wall or a surface that you can hold onto for balance. Bend at the waist and let your left / right arm hang straight down. Use your other arm to support you. Keep your back straight and do not lock your knees. Relax your left / right arm and shoulder muscles, and move your hips and your trunk so your left / right arm swings freely. Your arm should swing because of the motion of your body, not because you are using your arm or shoulder muscles. Keep moving your hips and trunk so your arm swings in the following directions, as told by your health care provider: Side to side. Forward and backward. In clockwise and counterclockwise circles. Continue each motion for __________ seconds, or for as long as told by your health care provider. Slowly return to the starting position. Repeat __________ times. Complete this exercise __________ times a day. Shoulder flexion, standing  Stand and hold a broomstick, a cane, or a similar object. Place your hands a little more than shoulder width apart on the object. Your left / right hand should be palm up, and your other hand should be palm down. Keep your elbow straight and your shoulder muscles relaxed. Push the stick up with your healthy arm to raise your left / right arm in front of your body, and then over your head until you feel a stretch in your shoulder (flexion). Avoid   shrugging your shoulder while you raise your arm. Keep your shoulder blade tucked down toward the middle of your back. Hold for __________ seconds. Slowly return to the starting position. Repeat __________ times. Complete this exercise __________ times a day. Shoulder abduction, standing Stand and hold a broomstick, a cane, or a similar object. Place your hands a little more than shoulder  width apart on the object. Your left / right hand should be palm up, and your other hand should be palm down. Keep your elbow straight and your shoulder muscles relaxed. Push the object across your body toward your left / right side. Raise your left / right arm to the side of your body (abduction) until you feel a stretch in your shoulder. Do not raise your arm above shoulder height unless your health care provider tells you to do that. If directed, raise your arm over your head. Avoid shrugging your shoulder while you raise your arm. Keep your shoulder blade tucked down toward the middle of your back. Hold for __________ seconds. Slowly return to the starting position. Repeat __________ times. Complete this exercise __________ times a day. Internal rotation  Place your left / right hand behind your back, palm up. Use your other hand to dangle an exercise band, a towel, or a similar object over your shoulder. Grasp the band with your left / right hand so you are holding on to both ends. Gently pull up on the band until you feel a stretch in the front of your left / right shoulder. The movement of your arm toward the center of your body is called internal rotation. Avoid shrugging your shoulder while you raise your arm. Keep your shoulder blade tucked down toward the middle of your back. Hold for __________ seconds. Release the stretch by letting go of the band and lowering your hands. Repeat __________ times. Complete this exercise __________ times a day. Strengthening exercises External rotation  Sit in a stable chair without armrests. Secure an exercise band to a stable object at elbow height on your left / right side. Place a soft object, such as a folded towel or a small pillow, between your left / right upper arm and your body to move your elbow about 4 inches (10 cm) away from your side. Hold the end of the exercise band so it is tight and there is no slack. Keeping your elbow pressed  against the soft object, slowly move your forearm out, away from your abdomen (external rotation). Keep your body steady so only your forearm moves. Hold for __________ seconds. Slowly return to the starting position. Repeat __________ times. Complete this exercise __________ times a day. Shoulder abduction  Sit in a stable chair without armrests, or stand up. Hold a __________ weight in your left / right hand, or hold an exercise band with both hands. Start with your arms straight down and your left / right palm facing in, toward your body. Slowly lift your left / right hand out to your side (abduction). Do not lift your hand above shoulder height unless your health care provider tells you that this is safe. Keep your arms straight. Avoid shrugging your shoulder while you do this movement. Keep your shoulder blade tucked down toward the middle of your back. Hold for __________ seconds. Slowly lower your arm, and return to the starting position. Repeat __________ times. Complete this exercise __________ times a day. Shoulder extension Sit in a stable chair without armrests, or stand up. Secure an exercise band   to a stable object in front of you so it is at shoulder height. Hold one end of the exercise band in each hand. Your palms should face each other. Straighten your elbows and lift your hands up to shoulder height. Step back, away from the secured end of the exercise band, until the band is tight and there is no slack. Squeeze your shoulder blades together as you pull your hands down to the sides of your thighs (extension). Stop when your hands are straight down by your sides. Do not let your hands go behind your body. Hold for __________ seconds. Slowly return to the starting position. Repeat __________ times. Complete this exercise __________ times a day. Shoulder row Sit in a stable chair without armrests, or stand up. Secure an exercise band to a stable object in front of you so it  is at waist height. Hold one end of the exercise band in each hand. Position your palms so that your thumbs are facing the ceiling (neutral position). Bend each of your elbows to a 90-degree angle (right angle) and keep your upper arms at your sides. Step back until the band is tight and there is no slack. Slowly pull your elbows back behind you. Hold for __________ seconds. Slowly return to the starting position. Repeat __________ times. Complete this exercise __________ times a day. Shoulder press-ups  Sit in a stable chair that has armrests. Sit upright, with your feet flat on the floor. Put your hands on the armrests so your elbows are bent and your fingers are pointing forward. Your hands should be about even with the sides of your body. Push down on the armrests and use your arms to lift yourself off the chair. Straighten your elbows and lift yourself up as much as you comfortably can. Move your shoulder blades down, and avoid letting your shoulders move up toward your ears. Keep your feet on the ground. As you get stronger, your feet should support less of your body weight as you lift yourself up. Hold for __________ seconds. Slowly lower yourself back into the chair. Repeat __________ times. Complete this exercise __________ times a day. Wall push-ups  Stand so you are facing a stable wall. Your feet should be about one arm-length away from the wall. Lean forward and place your palms on the wall at shoulder height. Keep your feet flat on the floor as you bend your elbows and lean forward toward the wall. Hold for __________ seconds. Straighten your elbows to push yourself back to the starting position. Repeat __________ times. Complete this exercise __________ times a day. This information is not intended to replace advice given to you by your health care provider. Make sure you discuss any questions you have with your healthcare provider. Document Revised: 05/16/2018 Document  Reviewed: 02/21/2018 [employer] Patient Education  2022 [employer]  

## 2020-07-26 NOTE — Telephone Encounter (Signed)
Last Visit: 07/26/2020  Next Visit: 12/26/2020  Labs: 04/02/2020 RBC 3.40, Hgb 9.9, Hct 31.3, Glucose 118, Calcium 8.5 (labs updated today.)  Eye exam:  10/20/2019 WNL   Current Dose per office note 07/26/2020: Plaquenil 200 mg 1 tablet by mouth twice daily DX: Autoimmune disease  Last Fill: 03/01/2020  Okay to refill Plaquenil?

## 2020-07-26 NOTE — Telephone Encounter (Signed)
Patient called stating she forgot to request a prescription refill of her Plaquenil at her appointment this morning.

## 2020-07-27 ENCOUNTER — Other Ambulatory Visit: Payer: Self-pay | Admitting: *Deleted

## 2020-07-27 DIAGNOSIS — M359 Systemic involvement of connective tissue, unspecified: Secondary | ICD-10-CM

## 2020-07-27 DIAGNOSIS — Z79899 Other long term (current) drug therapy: Secondary | ICD-10-CM

## 2020-07-27 LAB — CBC WITH DIFFERENTIAL/PLATELET
Absolute Monocytes: 413 cells/uL (ref 200–950)
Basophils Absolute: 19 cells/uL (ref 0–200)
Basophils Relative: 0.7 %
Eosinophils Absolute: 208 cells/uL (ref 15–500)
Eosinophils Relative: 7.7 %
HCT: 36.6 % (ref 35.0–45.0)
Hemoglobin: 12.1 g/dL (ref 11.7–15.5)
Lymphs Abs: 778 cells/uL — ABNORMAL LOW (ref 850–3900)
MCH: 28.4 pg (ref 27.0–33.0)
MCHC: 33.1 g/dL (ref 32.0–36.0)
MCV: 85.9 fL (ref 80.0–100.0)
MPV: 11.2 fL (ref 7.5–12.5)
Monocytes Relative: 15.3 %
Neutro Abs: 1283 cells/uL — ABNORMAL LOW (ref 1500–7800)
Neutrophils Relative %: 47.5 %
Platelets: 247 10*3/uL (ref 140–400)
RBC: 4.26 10*6/uL (ref 3.80–5.10)
RDW: 12.5 % (ref 11.0–15.0)
Total Lymphocyte: 28.8 %
WBC: 2.7 10*3/uL — ABNORMAL LOW (ref 3.8–10.8)

## 2020-07-27 LAB — URINALYSIS, ROUTINE W REFLEX MICROSCOPIC
Bacteria, UA: NONE SEEN /HPF
Bilirubin Urine: NEGATIVE
Glucose, UA: NEGATIVE
Hgb urine dipstick: NEGATIVE
Hyaline Cast: NONE SEEN /LPF
Ketones, ur: NEGATIVE
Leukocytes,Ua: NEGATIVE
Nitrite: NEGATIVE
Specific Gravity, Urine: 1.026 (ref 1.001–1.035)
WBC, UA: NONE SEEN /HPF (ref 0–5)
pH: <=5 (ref 5.0–8.0)

## 2020-07-27 LAB — COMPLETE METABOLIC PANEL WITH GFR
AG Ratio: 1.6 (calc) (ref 1.0–2.5)
ALT: 8 U/L (ref 6–29)
AST: 13 U/L (ref 10–35)
Albumin: 4.2 g/dL (ref 3.6–5.1)
Alkaline phosphatase (APISO): 81 U/L (ref 37–153)
BUN: 10 mg/dL (ref 7–25)
CO2: 28 mmol/L (ref 20–32)
Calcium: 9.4 mg/dL (ref 8.6–10.4)
Chloride: 105 mmol/L (ref 98–110)
Creat: 0.7 mg/dL (ref 0.60–0.93)
GFR, Est African American: 98 mL/min/{1.73_m2} (ref >=60)
GFR, Est Non African American: 84 mL/min/{1.73_m2} (ref >=60)
Globulin: 2.7 g/dL (calc) (ref 1.9–3.7)
Glucose, Bld: 82 mg/dL (ref 65–99)
Potassium: 3.8 mmol/L (ref 3.5–5.3)
Sodium: 142 mmol/L (ref 135–146)
Total Bilirubin: 0.6 mg/dL (ref 0.2–1.2)
Total Protein: 6.9 g/dL (ref 6.1–8.1)

## 2020-07-27 LAB — C3 AND C4
C3 Complement: 125 mg/dL (ref 83–193)
C4 Complement: 38 mg/dL (ref 15–57)

## 2020-07-27 LAB — ANTI-DNA ANTIBODY, DOUBLE-STRANDED: ds DNA Ab: <1 IU/mL

## 2020-07-27 LAB — MICROSCOPIC MESSAGE

## 2020-07-27 LAB — SEDIMENTATION RATE: Sed Rate: 9 mm/h (ref 0–30)

## 2020-07-27 NOTE — Progress Notes (Signed)
White cell count is low.  CMP is normal.  Double-stranded DNA is negative, complements are normal, sed rate is normal, urine showed 1+ protein and calcium oxalate crystals.  Repeat CBC in 1 month with urine protein creatinine ratio.  No change in treatment advised.

## 2020-08-04 ENCOUNTER — Encounter: Payer: Self-pay | Admitting: Specialist

## 2020-08-04 ENCOUNTER — Other Ambulatory Visit: Payer: Self-pay

## 2020-08-04 ENCOUNTER — Ambulatory Visit (INDEPENDENT_AMBULATORY_CARE_PROVIDER_SITE_OTHER): Payer: Medicare HMO | Admitting: Specialist

## 2020-08-04 VITALS — BP 147/84 | HR 71 | Ht 70.0 in | Wt 259.0 lb

## 2020-08-04 DIAGNOSIS — M4802 Spinal stenosis, cervical region: Secondary | ICD-10-CM

## 2020-08-04 DIAGNOSIS — T84068D Wear of articular bearing surface of other internal prosthetic joint, subsequent encounter: Secondary | ICD-10-CM | POA: Diagnosis not present

## 2020-08-04 DIAGNOSIS — M542 Cervicalgia: Secondary | ICD-10-CM | POA: Diagnosis not present

## 2020-08-04 DIAGNOSIS — Z96649 Presence of unspecified artificial hip joint: Secondary | ICD-10-CM

## 2020-08-04 DIAGNOSIS — R2689 Other abnormalities of gait and mobility: Secondary | ICD-10-CM | POA: Diagnosis not present

## 2020-08-04 NOTE — Patient Instructions (Signed)
Avoid overhead lifting and overhead use of the arms. Do not lift greater than 5 lbs. Adjust head rest in vehicle to prevent hyperextension if rear ended. Take extra precautions to avoid falling, including use of a cane if you feel weak. Fall Prevention and Home Safety Falls cause injuries and can affect all age groups. It is possible to use preventive measures to significantly decrease the likelihood of falls. There are many simple measures which can make your home safer and prevent falls. OUTDOORS Repair cracks and edges of walkways and driveways. Remove high doorway thresholds. Trim shrubbery on the main path into your home. Have good outside lighting. Clear walkways of tools, rocks, debris, and clutter. Check that handrails are not broken and are securely fastened. Both sides of steps should have handrails. Have leaves, snow, and ice cleared regularly. Use sand or salt on walkways during winter months. In the garage, clean up grease or oil spills. BATHROOM Install night lights. Install grab bars by the toilet and in the tub and shower. Use non-skid mats or decals in the tub or shower. Place a plastic non-slip stool in the shower to sit on, if needed. Keep floors dry and clean up all water on the floor immediately. Remove soap buildup in the tub or shower on a regular basis. Secure bath mats with non-slip, double-sided rug tape. Remove throw rugs and tripping hazards from the floors. BEDROOMS Install night lights. Make sure a bedside light is easy to reach. Do not use oversized bedding. Keep a telephone by your bedside. Have a firm chair with side arms to use for getting dressed. Remove throw rugs and tripping hazards from the floor. KITCHEN Keep handles on pots and pans turned toward the center of the stove. Use back burners when possible. Clean up spills quickly and allow time for drying. Avoid walking on wet floors. Avoid hot utensils and knives. Position shelves so they are  not too high or low. Place commonly used objects within easy reach. If necessary, use a sturdy step stool with a grab bar when reaching. Keep electrical cables out of the way. Do not use floor polish or wax that makes floors slippery. If you must use wax, use non-skid floor wax. Remove throw rugs and tripping hazards from the floor. STAIRWAYS Never leave objects on stairs. Place handrails on both sides of stairways and use them. Fix any loose handrails. Make sure handrails on both sides of the stairways are as long as the stairs. Check carpeting to make sure it is firmly attached along stairs. Make repairs to worn or loose carpet promptly. Avoid placing throw rugs at the top or bottom of stairways, or properly secure the rug with carpet tape to prevent slippage. Get rid of throw rugs, if possible. Have an electrician put in a light switch at the top and bottom of the stairs. OTHER FALL PREVENTION TIPS Wear low-heel or rubber-soled shoes that are supportive and fit well. Wear closed toe shoes. When using a stepladder, make sure it is fully opened and both spreaders are firmly locked. Do not climb a closed stepladder. Add color or contrast paint or tape to grab bars and handrails in your home. Place contrasting color strips on first and last steps. Learn and use mobility aids as needed. Install an electrical emergency response system. Turn on lights to avoid dark areas. Replace light bulbs that burn out immediately. Get light switches that glow. Arrange furniture to create clear pathways. Keep furniture in the same place. Firmly attach  carpet with non-skid or double-sided tape. Eliminate uneven floor surfaces. Select a carpet pattern that does not visually hide the edge of steps. Be aware of all pets. OTHER HOME SAFETY TIPS Set the water temperature for 120 F (48.8 C). Keep emergency numbers on or near the telephone. Keep smoke detectors on every level of the home and near sleeping  areas. Document Released: 01/12/2002 Document Revised: 07/24/2011 Document Reviewed: 04/13/2011 [facility] Patient Information 2014 [employer].

## 2020-08-04 NOTE — Progress Notes (Signed)
Office Visit Note   Patient: Taylor Mccall           Date of Birth: July 14, 1945           MRN: [id number] Visit Date: 08/04/2020              Requested by: Dorothyann Peng, NP [place],  [place] [address] PCP: Dorothyann Peng, NP   Assessment & Plan: Visit Diagnoses:  1. Status post revision of total hip   2. Polyethylene liner wear following total hip arthroplasty requiring isolated polyethylene liner exchange, subsequent encounter   3. Spinal stenosis of cervical region   4. Balance disorder   5. Cervicalgia     Plan: Avoid overhead lifting and overhead use of the arms. Do not lift greater than 5 lbs. Adjust head rest in vehicle to prevent hyperextension if rear ended. Take extra precautions to avoid falling, including use of a cane if you feel weak. Fall Prevention and Home Safety Falls cause injuries and can affect all age groups. It is possible to use preventive measures to significantly decrease the likelihood of falls. There are many simple measures which can make your home safer and prevent falls. OUTDOORS Repair cracks and edges of walkways and driveways. Remove high doorway thresholds. Trim shrubbery on the main path into your home. Have good outside lighting. Clear walkways of tools, rocks, debris, and clutter. Check that handrails are not broken and are securely fastened. Both sides of steps should have handrails. Have leaves, snow, and ice cleared regularly. Use sand or salt on walkways during winter months. In the garage, clean up grease or oil spills. BATHROOM Install night lights. Install grab bars by the toilet and in the tub and shower. Use non-skid mats or decals in the tub or shower. Place a plastic non-slip stool in the shower to sit on, if needed. Keep floors dry and clean up all water on the floor immediately. Remove soap buildup in the tub or shower on a regular basis. Secure bath mats with non-slip, double-sided rug  tape. Remove throw rugs and tripping hazards from the floors. BEDROOMS Install night lights. Make sure a bedside light is easy to reach. Do not use oversized bedding. Keep a telephone by your bedside. Have a firm chair with side arms to use for getting dressed. Remove throw rugs and tripping hazards from the floor. KITCHEN Keep handles on pots and pans turned toward the center of the stove. Use back burners when possible. Clean up spills quickly and allow time for drying. Avoid walking on wet floors. Avoid hot utensils and knives. Position shelves so they are not too high or low. Place commonly used objects within easy reach. If necessary, use a sturdy step stool with a grab bar when reaching. Keep electrical cables out of the way. Do not use floor polish or wax that makes floors slippery. If you must use wax, use non-skid floor wax. Remove throw rugs and tripping hazards from the floor. STAIRWAYS Never leave objects on stairs. Place handrails on both sides of stairways and use them. Fix any loose handrails. Make sure handrails on both sides of the stairways are as long as the stairs. Check carpeting to make sure it is firmly attached along stairs. Make repairs to worn or loose carpet promptly. Avoid placing throw rugs at the top or bottom of stairways, or properly secure the rug with carpet tape to prevent slippage. Get rid of throw rugs, if possible. Have an electrician put  in a light switch at the top and bottom of the stairs. OTHER FALL PREVENTION TIPS Wear low-heel or rubber-soled shoes that are supportive and fit well. Wear closed toe shoes. When using a stepladder, make sure it is fully opened and both spreaders are firmly locked. Do not climb a closed stepladder. Add color or contrast paint or tape to grab bars and handrails in your home. Place contrasting color strips on first and last steps. Learn and use mobility aids as needed. Install an electrical emergency response  system. Turn on lights to avoid dark areas. Replace light bulbs that burn out immediately. Get light switches that glow. Arrange furniture to create clear pathways. Keep furniture in the same place. Firmly attach carpet with non-skid or double-sided tape. Eliminate uneven floor surfaces. Select a carpet pattern that does not visually hide the edge of steps. Be aware of all pets. OTHER HOME SAFETY TIPS Set the water temperature for 120 F (48.8 C). Keep emergency numbers on or near the telephone. Keep smoke detectors on every level of the home and near sleeping areas. Document Released: 01/12/2002 Document Revised: 07/24/2011 Document Reviewed: 04/13/2011 [facility] Patient Information 2014 [place].  Follow-Up Instructions: Return in about 4 weeks (around 09/01/2020).   Orders:  No orders of the defined types were placed in this encounter.  No orders of the defined types were placed in this encounter.     Procedures: No procedures performed   Clinical Data: No additional findings.   Subjective: Chief Complaint  Patient presents with   Neck - Follow-up, Pain    75 year old female right handed female with chronic cervicalgia and intermittant hand numbness and tingling. She report decreased penmanship when writing tha and this helps. Some clumbsiness. Overall had left total hip revision with good relief of pain. Now with bilateral knee pain left greater than right. She is seeing Dr. Ruel Favors office, Sports Medicine and Orthopaedics and planns to be seen for evaluation of the left knee there. Previous right TKR by Dr. Sharol Given. And left hip replacement by Dr. Luan Pulling.   Review of Systems  Constitutional: Negative.   HENT: Negative.    Eyes: Negative.   Respiratory: Negative.    Cardiovascular: Negative.   Gastrointestinal: Negative.   Endocrine: Negative.   Genitourinary: Negative.   Musculoskeletal: Negative.   Skin: Negative.   Allergic/Immunologic: Negative.    Neurological: Negative.   Hematological: Negative.   Psychiatric/Behavioral: Negative.      Objective: Vital Signs: BP (!) 147/84 (BP Location: Left Arm, Patient Position: Sitting)   Pulse 71   Ht 5\' 10"  (1.778 m)   Wt 259 lb (117.5 kg)   BMI 37.16 kg/m   Physical Exam Constitutional:      Appearance: She is well-developed.  HENT:     Head: Normocephalic and atraumatic.  Eyes:     Pupils: Pupils are equal, round, and reactive to light.  Pulmonary:     Effort: Pulmonary effort is normal.     Breath sounds: Normal breath sounds.  Abdominal:     General: Bowel sounds are normal.     Palpations: Abdomen is soft.  Musculoskeletal:     Cervical back: Normal range of motion and neck supple.     Lumbar back: Negative right straight leg raise test.  Skin:    General: Skin is warm and dry.  Neurological:     Mental Status: She is alert and oriented to person, place, and time.  Psychiatric:  Behavior: Behavior normal.        Thought Content: Thought content normal.        Judgment: Judgment normal.   Back Exam   Tenderness  The patient is experiencing tenderness in the cervical.  Range of Motion  Extension:  abnormal  Flexion:  abnormal  Lateral bend right:  abnormal  Lateral bend left:  abnormal  Rotation right:  abnormal  Rotation left:  abnormal   Muscle Strength  Right Quadriceps:  5/5  Left Quadriceps:  5/5  Right Hamstrings:  5/5  Left Hamstrings:  5/5   Tests  Straight leg raise right: negative  Other  Toe walk: abnormal Heel walk: abnormal Erythema: no back redness Scars: absent    Specialty Comments:  No specialty comments available.  Imaging: No results found.   PMFS History: Patient Active Problem List   Diagnosis Date Noted   Status post revision of total hip 04/01/2020   Polyethylene liner wear following total hip arthroplasty requiring isolated polyethylene liner exchange ([place]) 03/31/2020   Intermediate stage nonexudative  age-related macular degeneration of right eye 12/23/2019   Exudative age-related macular degeneration of left eye with inactive choroidal neovascularization ([place]) 12/23/2019   Primary open angle glaucoma of both eyes, moderate stage 12/23/2019   Exudative retinopathy of left eye 12/23/2019   Routine general medical examination at a health care facility 12/11/2016   Primary osteoarthritis of left hip 05/01/2016   Autoimmune disease ([place]) 11/29/2015   High risk medication use 11/29/2015   Vitamin D deficiency 11/29/2015   Macular degeneration 11/29/2015   Bilateral lower extremity edema 10/26/2015   H/O total knee replacement, bilateral 12/09/2013   Breast mass, left 01/18/2011   Obesity 12/08/2009   Venous (peripheral) insufficiency 07/28/2009   PAIN IN JOINT, ANKLE AND FOOT 01/12/2008   Obstructive sleep apnea 08/29/2007   GAIT DISTURBANCE 08/14/2007   Depression 08/26/2006   Essential hypertension 08/26/2006   Primary osteoarthritis of both hands 08/26/2006   Urinary incontinence 08/26/2006   Past Medical History:  Diagnosis Date   Anxiety    Blood transfusion without reported diagnosis    Breast mass, left    Cataract    Depression    Difficult intubation 04/01/2020   Known difficult airway from previous records   Gait disturbance    GERD (gastroesophageal reflux disease)    Hypertension    Lymphedema of both lower extremities    Seeing OT at [facility] to Left Lower leg   Obesity    Osteoarthritis    Rheumatoid arthritis(714.0)    Sleep apnea    cpap   Urinary incontinence    Venous insufficiency     Family History  Problem Relation Age of Onset   Diabetes Other    Stroke Other    Asthma Brother    Heart disease Brother    Coronary artery disease Father    Skin cancer Father    Heart disease Father    Coronary artery disease Brother    Colon cancer Neg Hx    Esophageal cancer Neg Hx    Rectal cancer Neg Hx    Stomach cancer Neg  Hx     Past Surgical History:  Procedure Laterality Date   ABDOMINAL HYSTERECTOMY     ANTERIOR HIP REVISION Left 04/01/2020   Procedure: ANTERIOR LEFT HIP REVISION OF ACETABULAR COMPONENT;  Surgeon: Mcarthur Rossetti, MD;  Location: [facility];  Service: Orthopedics;  Laterality: Left;  3E   BLADDER SURGERY  sling   BLADDER SUSPENSION  2009   BREAST REDUCTION SURGERY     COLONOSCOPY     ESOPHAGOGASTRODUODENOSCOPY     EYE SURGERY     REPLACEMENT TOTAL KNEE Left    TOE SURGERY Left    Left great toe   toenail removal Right    all 5 toenails   TOTAL HIP ARTHROPLASTY     TOTAL KNEE ARTHROPLASTY Right 12/09/2013   Procedure: Right Total Knee Arthroplasty;  Surgeon: Newt Minion, MD;  Location: [facility];  Service: Orthopedics;  Laterality: Right;   VESICOVAGINAL FISTULA CLOSURE W/ TAH     Social History   Occupational History   Not on file  Tobacco Use   Smoking status: Former    Packs/day: 0.30    Years: 10.00    Pack years: 3.00    Types: Cigarettes    Quit date: 02/05/1978    Years since quitting: 42.5   Smokeless tobacco: Never   Tobacco comments:    over 25 years ago...pt doesnt remember when she quit.   Vaping Use   Vaping Use: Never used  Substance and Sexual Activity   Alcohol use: No   Drug use: No   Sexual activity: Not on file

## 2020-08-10 ENCOUNTER — Ambulatory Visit (INDEPENDENT_AMBULATORY_CARE_PROVIDER_SITE_OTHER): Payer: Medicare HMO | Admitting: Orthopaedic Surgery

## 2020-08-10 ENCOUNTER — Encounter: Payer: Self-pay | Admitting: Orthopaedic Surgery

## 2020-08-10 ENCOUNTER — Ambulatory Visit (INDEPENDENT_AMBULATORY_CARE_PROVIDER_SITE_OTHER): Payer: Medicare HMO

## 2020-08-10 DIAGNOSIS — Z96642 Presence of left artificial hip joint: Secondary | ICD-10-CM

## 2020-08-10 DIAGNOSIS — T84068D Wear of articular bearing surface of other internal prosthetic joint, subsequent encounter: Secondary | ICD-10-CM | POA: Diagnosis not present

## 2020-08-10 DIAGNOSIS — IMO0001 Reserved for inherently not codable concepts without codable children: Secondary | ICD-10-CM

## 2020-08-10 DIAGNOSIS — Z96649 Presence of unspecified artificial hip joint: Secondary | ICD-10-CM

## 2020-08-10 NOTE — Progress Notes (Signed)
The patient is now 5 months out from a left hip operation.  This was a polyliner exchange and a hip ball exchange.  Her original hip was done many years ago.  She had symptomatic synovitis and obvious polyliner wear on plain films.  We took her to the operating room in February and through an anterior approach was able to revise her hip ball to a larger ball and a new polyethylene liner.  The metal components were intact and in place.  She says the hip is doing well and has just a little bit of soreness.  She does have a history of both her knees being replaced.  She believes the left knee was around 2012 and the right knee was 2015.  The right knee was done by Dr. Sharol Given and the left knee was done by Dr. Telford Nab who is since retired.  She is feeling as if that left knee is becoming unstable.  We did not x-ray it today.  X-rays today of her left hip show well-seated implants and I was able to compare this to her preoperative films to show her that we change the hip on the liner and the alignment is anatomic.  Her left operative hip moves smoothly and fluidly.  The incision is healed nicely.  She has well-healed surgical incision over her left knee.  It is painful throughout the arc of motion and some slight warmth.  There is no redness.  There is slight instability on clinical exam.  Her right total knee appears more stable.  I want her to continue to strengthen her quad muscles.  She does ambulate with a cane.  I would like to see her back in 6 weeks.  At that visit I would actually like an AP and lateral of her left knee to assess her previous total knee arthroplasty of the left knee.  She may end up needing some type of revision but that depends what we see with those x-rays.  She agrees with this treatment plan.

## 2020-09-12 ENCOUNTER — Other Ambulatory Visit: Payer: Self-pay

## 2020-09-13 ENCOUNTER — Other Ambulatory Visit: Payer: Self-pay | Admitting: *Deleted

## 2020-09-13 ENCOUNTER — Ambulatory Visit (INDEPENDENT_AMBULATORY_CARE_PROVIDER_SITE_OTHER): Payer: Medicare HMO | Admitting: Adult Health

## 2020-09-13 ENCOUNTER — Encounter: Payer: Self-pay | Admitting: Adult Health

## 2020-09-13 VITALS — BP 120/74 | HR 73 | Temp 97.8°F | Ht 69.5 in | Wt 253.0 lb

## 2020-09-13 DIAGNOSIS — R69 Illness, unspecified: Secondary | ICD-10-CM | POA: Diagnosis not present

## 2020-09-13 DIAGNOSIS — Z79899 Other long term (current) drug therapy: Secondary | ICD-10-CM | POA: Diagnosis not present

## 2020-09-13 DIAGNOSIS — K219 Gastro-esophageal reflux disease without esophagitis: Secondary | ICD-10-CM

## 2020-09-13 DIAGNOSIS — M359 Systemic involvement of connective tissue, unspecified: Secondary | ICD-10-CM

## 2020-09-13 DIAGNOSIS — Z0000 Encounter for general adult medical examination without abnormal findings: Secondary | ICD-10-CM | POA: Diagnosis not present

## 2020-09-13 DIAGNOSIS — G4733 Obstructive sleep apnea (adult) (pediatric): Secondary | ICD-10-CM

## 2020-09-13 DIAGNOSIS — I10 Essential (primary) hypertension: Secondary | ICD-10-CM

## 2020-09-13 DIAGNOSIS — F325 Major depressive disorder, single episode, in full remission: Secondary | ICD-10-CM

## 2020-09-13 LAB — CBC WITH DIFFERENTIAL/PLATELET
Basophils Absolute: 0.1 10*3/uL (ref 0.0–0.1)
Basophils Relative: 1.5 % (ref 0.0–3.0)
Eosinophils Absolute: 0.2 10*3/uL (ref 0.0–0.7)
Eosinophils Relative: 5.8 % — ABNORMAL HIGH (ref 0.0–5.0)
HCT: 37 % (ref 36.0–46.0)
Hemoglobin: 12.4 g/dL (ref 12.0–15.0)
Lymphocytes Relative: 25.3 % (ref 12.0–46.0)
Lymphs Abs: 1 10*3/uL (ref 0.7–4.0)
MCHC: 33.5 g/dL (ref 30.0–36.0)
MCV: 87 fl (ref 78.0–100.0)
Monocytes Absolute: 0.3 10*3/uL (ref 0.1–1.0)
Monocytes Relative: 8.3 % (ref 3.0–12.0)
Neutro Abs: 2.4 10*3/uL (ref 1.4–7.7)
Neutrophils Relative %: 59.1 % (ref 43.0–77.0)
Platelets: 267 10*3/uL (ref 150.0–400.0)
RBC: 4.25 Mil/uL (ref 3.87–5.11)
RDW: 13.6 % (ref 11.5–15.5)
WBC: 4.1 10*3/uL (ref 4.0–10.5)

## 2020-09-13 LAB — LIPID PANEL
Cholesterol: 193 mg/dL (ref 0–200)
HDL: 68.3 mg/dL (ref >39.00)
LDL Cholesterol: 107 mg/dL — ABNORMAL HIGH (ref 0–99)
NonHDL: 124.91
Total CHOL/HDL Ratio: 3
Triglycerides: 88 mg/dL (ref 0.0–149.0)
VLDL: 17.6 mg/dL (ref 0.0–40.0)

## 2020-09-13 LAB — COMPREHENSIVE METABOLIC PANEL
ALT: 9 U/L (ref 0–35)
AST: 13 U/L (ref 0–37)
Albumin: 4.1 g/dL (ref 3.5–5.2)
Alkaline Phosphatase: 81 U/L (ref 39–117)
BUN: 12 mg/dL (ref 6–23)
CO2: 27 mEq/L (ref 19–32)
Calcium: 9.8 mg/dL (ref 8.4–10.5)
Chloride: 105 mEq/L (ref 96–112)
Creatinine, Ser: 0.82 mg/dL (ref 0.40–1.20)
GFR: 69.23 mL/min (ref >60.00)
Glucose, Bld: 82 mg/dL (ref 70–99)
Potassium: 3.8 mEq/L (ref 3.5–5.1)
Sodium: 141 mEq/L (ref 135–145)
Total Bilirubin: 0.7 mg/dL (ref 0.2–1.2)
Total Protein: 7 g/dL (ref 6.0–8.3)

## 2020-09-13 LAB — HEMOGLOBIN A1C: Hgb A1c MFr Bld: 5.2 % (ref 4.6–6.5)

## 2020-09-13 LAB — TSH: TSH: 3.13 u[IU]/mL (ref 0.35–5.50)

## 2020-09-13 MED ORDER — FUROSEMIDE 20 MG PO TABS
20.0000 mg | ORAL_TABLET | Freq: Two times a day (BID) | ORAL | 3 refills | Status: DC
Start: 2020-09-13 — End: 2022-02-06

## 2020-09-13 NOTE — Progress Notes (Signed)
Subjective:    Patient ID: Taylor Mccall, female    DOB: 13-Oct-1945, 75 y.o.   MRN: [id number]  HPI Patient presents for yearly preventative medicine examination. She is a pleasant 75 year old female who  has a past medical history of Anxiety, Blood transfusion without reported diagnosis, Breast mass, left, Cataract, Depression, Difficult intubation (04/01/2020), Gait disturbance, GERD (gastroesophageal reflux disease), Hypertension, Lymphedema of both lower extremities, Obesity, Osteoarthritis, Rheumatoid arthritis(714.0), Sleep apnea, Urinary incontinence, and Venous insufficiency.  Essential Hypertension -currently prescribed Lasix 20 mg BID and Cozaar 25 mg daily.  She denies dizziness, lightheadedness, chest pain, shortness of breath. BP Readings from Last 3 Encounters:  09/13/20 120/74  08/04/20 (!) 147/84  07/26/20 111/75   GERD-controlled with Protonix 40 mg daily.  Autoimmune disease- + ANA, + RNA.  She is followed by rheumatology.  Currently prescribed Plaquenil 200 mg BID,  no recent flares.  Does have chronic pain in bilateral hands and bilateral knees that are more consistent with osteoarthritis  Depression/sleep dysfunction-takes Zoloft 100 mg nightly.  Feels well controlled on this medication.  No depressive symptoms  OSA - uses CPAP irregularly   S/p Revision of left total hip - redone in June 2022 - reports that she has recovered well but now her left knee is bothering her. She has an appointment on August 17th  Lymphedema of bilateral legs - uses leg pump on a regular basis.     All immunizations and health maintenance protocols were reviewed with the patient and needed orders were placed.  Appropriate screening laboratory values were ordered for the patient including screening of hyperlipidemia, renal function and hepatic function.  Medication reconciliation,  past medical history, social history, problem list and allergies were reviewed in detail with the  patient  Goals were established with regard to weight loss, exercise, and  diet in compliance with medications Wt Readings from Last 3 Encounters:  09/13/20 253 lb (114.8 kg)  08/04/20 259 lb (117.5 kg)  07/26/20 258 lb 12.8 oz (117.4 kg)   She is up to date on routine breast cancer screening   Review of Systems  Constitutional: Negative.   HENT: Negative.    Eyes: Negative.   Respiratory: Negative.    Cardiovascular:  Positive for leg swelling.  Gastrointestinal: Negative.   Endocrine: Negative.   Genitourinary: Negative.   Musculoskeletal:  Positive for arthralgias, gait problem, neck pain and neck stiffness.  Skin: Negative.   Allergic/Immunologic: Negative.   Hematological: Negative.   Psychiatric/Behavioral: Negative.     Past Medical History:  Diagnosis Date   Anxiety    Blood transfusion without reported diagnosis    Breast mass, left    Cataract    Depression    Difficult intubation 04/01/2020   Known difficult airway from previous records   Gait disturbance    GERD (gastroesophageal reflux disease)    Hypertension    Lymphedema of both lower extremities    Seeing OT at [facility] to Left Lower leg   Obesity    Osteoarthritis    Rheumatoid arthritis(714.0)    Sleep apnea    cpap   Urinary incontinence    Venous insufficiency     Social History   Socioeconomic History   Marital status: Widowed    Spouse name: Not on file   Number of children: Not on file   Years of education: Not on file   Highest education level: Not on file  Occupational History   Not  on file  Tobacco Use   Smoking status: Former    Packs/day: 0.30    Years: 10.00    Pack years: 3.00    Types: Cigarettes    Quit date: 02/05/1978    Years since quitting: 42.6   Smokeless tobacco: Never   Tobacco comments:    over 25 years ago...pt doesnt remember when she quit.   Vaping Use   Vaping Use: Never used  Substance and Sexual Activity   Alcohol use:  No   Drug use: No   Sexual activity: Not on file  Other Topics Concern   Not on file  Social History Narrative   Not on file   Social Determinants of Health   Financial Resource Strain: Not on file  Food Insecurity: Not on file  Transportation Needs: Not on file  Physical Activity: Not on file  Stress: Not on file  Social Connections: Not on file  Intimate Partner Violence: Not on file    Past Surgical History:  Procedure Laterality Date   ABDOMINAL HYSTERECTOMY     ANTERIOR HIP REVISION Left 04/01/2020   Procedure: ANTERIOR LEFT HIP REVISION OF [place];  Surgeon: Mcarthur Rossetti, MD;  Location: [facility];  Service: Orthopedics;  Laterality: Left;  3E   BLADDER SURGERY     sling   BLADDER SUSPENSION  2009   BREAST REDUCTION SURGERY     COLONOSCOPY     ESOPHAGOGASTRODUODENOSCOPY     EYE SURGERY     REPLACEMENT TOTAL KNEE Left    TOE SURGERY Left    Left great toe   toenail removal Right    all 5 toenails   TOTAL HIP ARTHROPLASTY     TOTAL KNEE ARTHROPLASTY Right 12/09/2013   Procedure: Right Total Knee Arthroplasty;  Surgeon: Newt Minion, MD;  Location: [facility];  Service: Orthopedics;  Laterality: Right;   VESICOVAGINAL FISTULA CLOSURE W/ TAH      Family History  Problem Relation Age of Onset   Diabetes Other    Stroke Other    Asthma Brother    Heart disease Brother    Coronary artery disease Father    Skin cancer Father    Heart disease Father    Coronary artery disease Brother    Colon cancer Neg Hx    Esophageal cancer Neg Hx    Rectal cancer Neg Hx    Stomach cancer Neg Hx     Allergies  Allergen Reactions   Penicillins Rash    Current Outpatient Medications on File Prior to Visit  Medication Sig Dispense Refill   ALEVE 220 MG CAPS Take 220-440 mg by mouth 2 (two) times daily as needed (pain.).     Biotin w/ Vitamins C & E (HAIR SKIN & NAILS GUMMIES PO) Take 2 tablets by mouth daily.     brimonidine (ALPHAGAN) 0.2 % ophthalmic  solution 1 drop 2 (two) times daily.     carboxymethylcellulose (REFRESH PLUS) 0.5 % SOLN Place 2 drops into the right eye 4 (four) times daily as needed (for itchy eyes).     cholecalciferol (VITAMIN D) 25 MCG (1000 UNIT) tablet Take 1,000 Units by mouth daily.     Cyanocobalamin (VITAMIN B-12 PO) Take 2 tablets by mouth daily. (Gummy)     furosemide (LASIX) 20 MG tablet TAKE 1 TABLET (20 MG TOTAL) BY MOUTH 2 (TWO) TIMES DAILY. **DUE FOR YEARLY PHYSICAL** (Patient taking differently: Take 20 mg by mouth 2 (two) times daily. Morning & Midday) 180  tablet 0   hydroxychloroquine (PLAQUENIL) 200 MG tablet Take 1 tablet (200 mg total) by mouth 2 (two) times daily. 180 tablet 0   KLOR-CON M20 20 MEQ tablet TAKE 2 TABLETS (40 MEQ TOTAL) BY MOUTH EVERY MORNING. 180 tablet 3   latanoprost (XALATAN) 0.005 % ophthalmic solution SMARTSIG:1 Drop(s) In Eye(s) Every Evening     losartan (COZAAR) 25 MG tablet TAKE 1 TABLET BY MOUTH EVERY DAY 90 tablet 3   Multiple Vitamins-Minerals (ADULT GUMMY PO) Take 2 tablets by mouth daily. VitaFusion Multivitamins for Women (Gummy)     Omega-3 Fatty Acids (FISH OIL ADULT GUMMIES PO) Take 2 tablets by mouth daily. (Gummy)     pantoprazole (PROTONIX) 40 MG tablet TAKE ONE TABLET BY MOUTH ONCE DAILY BEFORE BREAKFAST 90 tablet 3   sertraline (ZOLOFT) 100 MG tablet TAKE 1 TABLET BY MOUTH EVERY DAY IN THE MORNING 90 tablet 3   vitamin C (ASCORBIC ACID) 250 MG tablet Take 500 mg by mouth daily. (Gummy)     vitamin E 180 MG (400 UNITS) capsule Take 400 Units by mouth daily.     No current facility-administered medications on file prior to visit.    BP 120/74   Pulse 73   Temp 97.8 F (36.6 C) (Oral)   Ht 5' 9.5" (1.765 m)   Wt 253 lb (114.8 kg)   SpO2 96%   BMI 36.83 kg/m         Objective:   Physical Exam Vitals and nursing note reviewed.  Constitutional:      General: She is not in acute distress.    Appearance: Normal appearance. She is well-developed. She is  obese. She is not ill-appearing.  HENT:     Head: Normocephalic and atraumatic.     Right Ear: Tympanic membrane, ear canal and external ear normal. There is no impacted cerumen.     Left Ear: Tympanic membrane, ear canal and external ear normal. There is no impacted cerumen.     Nose: Nose normal. No congestion or rhinorrhea.     Mouth/Throat:     Mouth: Mucous membranes are moist.     Pharynx: Oropharynx is clear. No oropharyngeal exudate or posterior oropharyngeal erythema.  Eyes:     General:        Right eye: No discharge.        Left eye: No discharge.     Extraocular Movements: Extraocular movements intact.     Conjunctiva/sclera: Conjunctivae normal.     Pupils: Pupils are equal, round, and reactive to light.  Neck:     Thyroid: No thyromegaly.     Vascular: No carotid bruit.     Trachea: No tracheal deviation.  Cardiovascular:     Rate and Rhythm: Normal rate and regular rhythm.     Pulses: Normal pulses.     Heart sounds: Normal heart sounds. No murmur heard.   No friction rub. No gallop.  Pulmonary:     Effort: Pulmonary effort is normal. No respiratory distress.     Breath sounds: Normal breath sounds. No stridor. No wheezing, rhonchi or rales.  Chest:     Chest wall: No tenderness.  Abdominal:     General: Abdomen is flat. Bowel sounds are normal. There is no distension.     Palpations: Abdomen is soft. There is no mass.     Tenderness: There is no abdominal tenderness. There is no right CVA tenderness, left CVA tenderness, guarding or rebound.     Hernia: No hernia  is present.  Musculoskeletal:        General: Tenderness present. No swelling, deformity or signs of injury. Normal range of motion.     Cervical back: Normal range of motion and neck supple.     Right lower leg: No edema.     Left lower leg: No edema.  Lymphadenopathy:     Cervical: No cervical adenopathy.  Skin:    General: Skin is warm and dry.     Coloration: Skin is not jaundiced or pale.      Findings: No bruising, erythema, lesion or rash.  Neurological:     General: No focal deficit present.     Mental Status: She is alert and oriented to person, place, and time.     Cranial Nerves: No cranial nerve deficit.     Sensory: No sensory deficit.     Motor: No weakness.     Coordination: Coordination normal.     Gait: Gait abnormal (walks with rolling walker).     Deep Tendon Reflexes: Reflexes normal.  Psychiatric:        Mood and Affect: Mood normal.        Behavior: Behavior normal.        Thought Content: Thought content normal.        Judgment: Judgment normal.      Assessment & Plan:  1. Routine general medical examination at a health care facility - Follow up in one year or sooner if needed  - Encouraged weight loss through lifestyle modifications  - CBC with Differential/Platelet; Future - Comprehensive metabolic panel; Future - Hemoglobin A1c; Future - Lipid panel; Future - TSH; Future - TSH - Lipid panel - Hemoglobin A1c - Comprehensive metabolic panel - CBC with Differential/Platelet  2. Essential hypertension - Well controlled. No change in medications  - CBC with Differential/Platelet; Future - Comprehensive metabolic panel; Future - Hemoglobin A1c; Future - Lipid panel; Future - TSH; Future - furosemide (LASIX) 20 MG tablet; Take 1 tablet (20 mg total) by mouth 2 (two) times daily. Morning & Midday  Dispense: 180 tablet; Refill: 3 - TSH - Lipid panel - Hemoglobin A1c - Comprehensive metabolic panel - CBC with Differential/Platelet  3. Obstructive sleep apnea - Wear CPAP nightly.  - CBC with Differential/Platelet; Future - Comprehensive metabolic panel; Future - Hemoglobin A1c; Future - Lipid panel; Future - TSH; Future - TSH - Lipid panel - Hemoglobin A1c - Comprehensive metabolic panel - CBC with Differential/Platelet  4. Autoimmune disease ([place]) - Follow up with Rheumatology as directed - CBC with Differential/Platelet; Future -  Comprehensive metabolic panel; Future - Hemoglobin A1c; Future - Lipid panel; Future - TSH; Future - TSH - Lipid panel - Hemoglobin A1c - Comprehensive metabolic panel - CBC with Differential/Platelet  5. Major depressive disorder in full remission, unspecified whether recurrent ([place]) - Continue with Zoloft 100 mg   6. Gastroesophageal reflux disease without esophagitis - Continue with Protonix    Dorothyann Peng, NP

## 2020-09-13 NOTE — Patient Instructions (Signed)
It was great seeing you today   We will follow up with you regarding your blood work   I will see you back in one year

## 2020-09-14 LAB — PROTEIN / CREATININE RATIO, URINE
Creatinine, Urine: 176 mg/dL (ref 20–275)
Protein/Creat Ratio: 63 mg/g creat (ref 21–161)
Protein/Creatinine Ratio: 0.063 mg/mg creat (ref 0.021–0.161)
Total Protein, Urine: 11 mg/dL (ref 5–24)

## 2020-09-14 MED ORDER — ROSUVASTATIN CALCIUM 5 MG PO TABS: 5.0000 mg | ORAL_TABLET | Freq: Every day | ORAL | 0 refills | Status: DC

## 2020-09-14 NOTE — Addendum Note (Signed)
Addended by: Nilda Riggs on: 09/14/2020 09:56 AM   Modules accepted: Orders

## 2020-09-21 ENCOUNTER — Other Ambulatory Visit: Payer: Self-pay

## 2020-09-21 ENCOUNTER — Encounter: Payer: Self-pay | Admitting: Orthopaedic Surgery

## 2020-09-21 ENCOUNTER — Ambulatory Visit (INDEPENDENT_AMBULATORY_CARE_PROVIDER_SITE_OTHER): Payer: Medicare HMO | Admitting: Orthopaedic Surgery

## 2020-09-21 ENCOUNTER — Ambulatory Visit (INDEPENDENT_AMBULATORY_CARE_PROVIDER_SITE_OTHER): Payer: Medicare HMO

## 2020-09-21 DIAGNOSIS — M25562 Pain in left knee: Secondary | ICD-10-CM | POA: Diagnosis not present

## 2020-09-21 DIAGNOSIS — T84093A Other mechanical complication of internal left knee prosthesis, initial encounter: Secondary | ICD-10-CM

## 2020-09-21 NOTE — Progress Notes (Signed)
Office Visit Note   Patient: Taylor Mccall           Date of Birth: 04-Sep-1945           MRN: [id number] Visit Date: 09/21/2020              Requested by: Taylor Peng, NP Taylor Mccall,  [facility] [id number] PCP: Taylor Peng, NP   Assessment & Plan: Visit Diagnoses:  1. Left knee pain, unspecified chronicity   2. Failed total knee, left, initial encounter [facility] Mccall)     Plan: Due to the patient's significant pain in the left knee and the fact that it is affecting quality of life recommend revision of the left knee.  This revision of both components.  Risk benefits discussed with the patient.  She understands postoperative protocol given the fact that she has had previous bilateral knee replacements.  Questions were encouraged and answered at length by Taylor Mccall and myself..  We will see her back 2 weeks postop.  Follow-Up Instructions: Return for post op.   Orders:  Orders Placed This Encounter  Procedures   XR Knee 1-2 Views Left   No orders of the defined types were placed in this encounter.     Procedures: No procedures performed   Clinical Data: No additional findings.   Subjective: Chief Complaint  Patient presents with   Left Hip - Pain   Left Knee - Pain    HPI HPI: Taylor Mccall returns today status post left hip revision 04/01/2020.  She states that hip is doing well.  She continues to have left knee pain.  Left total knee arthroplasty was performed some 10 years ago by Taylor Mccall.  She is having increasing pain in the knee.  States it grabs and causes her to scream out.  She feels as if the knee at times is "goes in the wrong direction".  She has had no Taylor injury.  She is ambulating with a walker.  She has had no fevers chills.  Review of Systems  Constitutional:  Negative for chills and fever.  Cardiovascular:  Negative for chest pain.    Objective: Vital Signs: There were no vitals taken for this visit.  Physical  Exam Constitutional:      Appearance: She is not ill-appearing or diaphoretic.  Pulmonary:     Effort: Pulmonary effort is normal.  Neurological:     Mental Status: She is alert and oriented to person, place, and time.  Psychiatric:        Mood and Affect: Mood normal.        Behavior: Behavior normal.    Ortho Exam Left hip excellent range of motion without pain. Left knee good range of motion patella tracks laterally.  No gross instability valgus varus stressing.  Tenderness along medial joint line left knee.  No abnormal warmth erythema.  Surgical incisions well-healed.  Left calf supple nontender. Specialty Comments:  No specialty comments available.  Imaging: XR Knee 1-2 Views Left  Result Date: 09/21/2020 Left knee 2 views: Patella tracks laterally.  Tibial component is subsided and there is.  No acute fractures.  Femoral component appears well-seated.  No bony abnormalities otherwise.    PMFS History: Patient Active Problem List   Diagnosis Date Noted   Status post revision of total hip 04/01/2020   Polyethylene liner wear following total hip arthroplasty requiring isolated polyethylene liner exchange ([facility]all) 03/31/2020   Intermediate stage nonexudative age-related macular degeneration of right eye 12/23/2019  Exudative age-related macular degeneration of left eye with inactive choroidal neovascularization (Taylor Mccall) 12/23/2019   Primary open angle glaucoma of both eyes, moderate stage 12/23/2019   Exudative retinopathy of left eye 12/23/2019   Routine general medical examination at a health care facility 12/11/2016   Primary osteoarthritis of left hip 05/01/2016   Autoimmune disease ([place]) 11/29/2015   High risk medication use 11/29/2015   Vitamin D deficiency 11/29/2015   Macular degeneration 11/29/2015   Bilateral lower extremity edema 10/26/2015   H/O total knee replacement, bilateral 12/09/2013   Breast mass, left 01/18/2011   Obesity 12/08/2009   Venous (peripheral)  insufficiency 07/28/2009   PAIN IN JOINT, ANKLE AND FOOT 01/12/2008   Obstructive sleep apnea 08/29/2007   GAIT DISTURBANCE 08/14/2007   Depression 08/26/2006   Essential hypertension 08/26/2006   Primary osteoarthritis of both hands 08/26/2006   Urinary incontinence 08/26/2006   Past Medical History:  Diagnosis Date   Anxiety    Blood transfusion without reported diagnosis    Breast mass, left    Cataract    Depression    Difficult intubation 04/01/2020   Known difficult airway from previous records   Gait disturbance    GERD (gastroesophageal reflux disease)    Hypertension    Lymphedema of both lower extremities    Seeing OT at [facility] to Left Lower leg   Obesity    Osteoarthritis    Rheumatoid arthritis(714.0)    Sleep apnea    cpap   Urinary incontinence    Venous insufficiency     Family History  Problem Relation Age of Onset   Diabetes Other    Stroke Other    Asthma Brother    Heart disease Brother    Coronary artery disease Father    Skin cancer Father    Heart disease Father    Coronary artery disease Brother    Colon cancer Neg Hx    Esophageal cancer Neg Hx    Rectal cancer Neg Hx    Stomach cancer Neg Hx     Past Surgical History:  Procedure Laterality Date   ABDOMINAL HYSTERECTOMY     ANTERIOR HIP REVISION Left 04/01/2020   Procedure: ANTERIOR LEFT HIP REVISION OF ACETAB[place] COMPONENT;  Surgeon: [facility] Rossetti, MD;  Location: [facility];  Service: Orthopedics;  Laterality: Left;  3E   BLADDER SURGERY     sling   BLADDER SUSPENSION  2009   BREAST REDUCTION SURGERY     COLONOSCOPY     ESOPHAGOGASTRODUODENOSCOPY     EYE SURGERY     REPLACEMENT TOTAL KNEE Left    TOE SURGERY Left    Left great toe   toenail removal Right    all 5 toenails   TOTAL HIP ARTHROPLASTY     TOTAL KNEE ARTHROPLASTY Right 12/09/2013   Procedure: Right Total Knee Arthroplasty;  Surgeon: Newt Minion, MD;  Location: [facility];  Service:  Orthopedics;  Laterality: Right;   VESICOVAGINAL FISTULA CLOSURE W/ TAH     Social History   Occupational History   Not on file  Tobacco Use   Smoking status: Former    Packs/day: 0.30    Years: 10.00    Pack years: 3.00    Types: Cigarettes    Quit date: 02/05/1978    Years since quitting: 42.6   Smokeless tobacco: Never   Tobacco comments:    over 25 years ago...pt doesnt remember when she quit.   Vaping Use  Vaping Use: Never used  Substance and Sexual Activity   Alcohol use: No   Drug use: No   Sexual activity: Not on file

## 2020-10-24 ENCOUNTER — Other Ambulatory Visit: Payer: Self-pay | Admitting: Physician Assistant

## 2020-10-24 DIAGNOSIS — M359 Systemic involvement of connective tissue, unspecified: Secondary | ICD-10-CM

## 2020-10-24 NOTE — Telephone Encounter (Signed)
Last Visit: 07/26/2020   Next Visit: 12/26/2020   Labs: 09/13/2020 Eosinophils Relative 5.8   Eye exam:  10/20/2019 WNL    Current Dose per office note 07/26/2020: Plaquenil 200 mg 1 tablet by mouth twice daily  DX: Autoimmune disease   Last Fill: 07/26/2020  Patient advised she is due to update PLQ eye exam. Patient states she has an appointment with the eye doctor this month.    [facility] to refill Plaquenil?

## 2020-11-10 ENCOUNTER — Other Ambulatory Visit: Payer: Self-pay

## 2020-11-10 ENCOUNTER — Encounter (INDEPENDENT_AMBULATORY_CARE_PROVIDER_SITE_OTHER): Payer: Self-pay

## 2020-11-10 DIAGNOSIS — H353112 Nonexudative age-related macular degeneration, right eye, intermediate dry stage: Secondary | ICD-10-CM | POA: Diagnosis not present

## 2020-11-10 DIAGNOSIS — H401132 Primary open-angle glaucoma, bilateral, moderate stage: Secondary | ICD-10-CM | POA: Diagnosis not present

## 2020-11-10 DIAGNOSIS — Z7960 Long term (current) use of unspecified immunomodulators and immunosuppressants: Secondary | ICD-10-CM | POA: Diagnosis not present

## 2020-11-10 DIAGNOSIS — H353222 Exudative age-related macular degeneration, left eye, with inactive choroidal neovascularization: Secondary | ICD-10-CM | POA: Diagnosis not present

## 2020-11-14 NOTE — Progress Notes (Signed)
Sent message, via epic in basket, requesting orders in epic from surgeon.  

## 2020-11-15 ENCOUNTER — Other Ambulatory Visit: Payer: Self-pay | Admitting: Physician Assistant

## 2020-11-15 DIAGNOSIS — T84018D Broken internal joint prosthesis, other site, subsequent encounter: Secondary | ICD-10-CM

## 2020-11-17 NOTE — Patient Instructions (Addendum)
DUE TO COVID-19 ONLY ONE VISITOR IS ALLOWED TO COME WITH YOU AND STAY IN THE WAITING ROOM ONLY DURING PRE OP AND PROCEDURE DAY OF SURGERY IF YOU ARE GOING HOME AFTER SURGERY. IF YOU ARE SPENDING THE NIGHT 2 PEOPLE MAY VISIT WITH YOU IN YOUR PRIVATE ROOM AFTER SURGERY UNTIL VISITING  HOURS ARE OVER AT 8:00 PM AND 1  VISITOR CAN SPEND THE NIGHT.   YOU NEED TO HAVE A COVID 19 TEST ON_10/21___THIS TEST MUST BE DONE BEFORE SURGERY,  COVID TESTING SITE  IS LOCATED AT [place], [place]. REMAIN IN YOUR CAR THIS IS A DRIVE UP TEST. AFTER YOUR COVID TEST PLEASE WEAR A MASK OUT IN PUBLIC AND SOCIAL DISTANCE AND [place] YOUR HANDS FREQUENTLY, ALSO ASK ALL YOUR CLOSE CONTACT PERSONS TO WEAR A MASK AND SOCIAL DISTANCE AND [place] THEIR HANDS FREQUENTLY ALSO.               Taylor Mccall     Your procedure is scheduled on: 11/25/20   Report to [facility] Main  Entrance   Report to admitting at   9:30 AM     Call this number if you have problems the morning of surgery [phone]    No food after midnight.    You may have clear liquid until 9:00 AM.    At 8:30 AM drink pre surgery drink.   Nothing by mouth after 9:00 AM.   CLEAR LIQUID DIET   Foods Allowed                                                                     Foods Excluded  Coffee and tea, regular and decaf                             liquids that you cannot  Plain Jell-O any favor except red or purple                                           see through such as: Fruit ices (not with fruit pulp)                                     milk, soups, orange juice  Iced Popsicles                                    All solid food Carbonated beverages, regular and diet                                    Cranberry, grape and apple juices Sports drinks like Gatorade Lightly seasoned clear broth or consume(fat free) Sugar    BRUSH YOUR TEETH MORNING OF SURGERY AND RINSE YOUR MOUTH OUT, NO CHEWING GUM CANDY OR  MINTS.     Take these medicines the morning of surgery with A SIP  OF WATER: Zoloft, Pantoprazole Bring your mask and tubing to the hospital   Stop taking ___ASA 81________on __10/13________as instructed by _____________.    Contact your Surgeon/Cardiologist for instructions on Anticoagulant Therapy prior to surgery.                                 You may not have any metal on your body including hair pins and              piercings  Do not wear jewelry, make-up, lotions, powders or perfumes, deodorant             Do not wear nail polish on your fingernails.  Do not shave  48 hours prior to surgery.                Do not bring valuables to the hospital. [place].  Contacts, dentures or bridgework may not be worn into surgery.                  Please read over the following fact sheets you were given: _____________________________________________________________________             [facility] - Preparing for Surgery Before surgery, you can play an important role.  Because skin is not sterile, your skin needs to be as free of germs as possible.  You can reduce the number of germs on your skin by washing with CHG (chlorahexidine gluconate) soap before surgery.  CHG is an antiseptic cleaner which kills germs and bonds with the skin to continue killing germs even after washing. Please DO NOT use if you have an allergy to CHG or antibacterial soaps.  If your skin becomes reddened/irritated stop using the CHG and inform your nurse when you arrive at Short Stay. Do not shave (including legs and underarms) for at least 48 hours prior to the first CHG shower. . Please follow these instructions carefully:  1.  Shower with CHG Soap the night before surgery and the  morning of Surgery.  2.  If you choose to wash your hair, wash your hair first as usual with your  normal  shampoo.  3.  After you shampoo, rinse your hair and body thoroughly to  remove the  shampoo.                            4.  Use CHG as you would any other liquid soap.  You can apply chg directly  to the skin and wash                       Gently with a scrungie or clean washcloth.  5.  Apply the CHG Soap to your body ONLY FROM THE NECK DOWN.   Do not use on face/ open                           Wound or open sores. Avoid contact with eyes, ears mouth and genitals (private parts).                       Wash face,  Genitals (private parts) with your normal soap.  6.  Wash thoroughly, paying special attention to the area where your surgery  will be performed.  7.  Thoroughly rinse your body with warm water from the neck down.  8.  DO NOT shower/wash with your normal soap after using and rinsing off  the CHG Soap.                9.  Pat yourself dry with a clean towel.            10.  Wear clean pajamas.            11.  Place clean sheets on your bed the night of your first shower and do not  sleep with pets. Day of Surgery : Do not apply any lotions/deodorants the morning of surgery.  Please wear clean clothes to the hospital/surgery center.  FAILURE TO FOLLOW THESE INSTRUCTIONS MAY RESULT IN THE CANCELLATION OF YOUR SURGERY PATIENT SIGNATURE_________________________________  NURSE SIGNATURE__________________________________  ________________________________________________________________________   Taylor Mccall  An incentive spirometer is a tool that can help keep your lungs clear and active. This tool measures how well you are filling your lungs with each breath. Taking long deep breaths may help reverse or decrease the chance of developing breathing (pulmonary) problems (especially infection) following: A long period of time when you are unable to move or be active. BEFORE THE PROCEDURE  If the spirometer includes an indicator to show your best effort, your nurse or respiratory therapist will set it to a desired goal. If possible, sit up  straight or lean slightly forward. Try not to slouch. Hold the incentive spirometer in an upright position. INSTRUCTIONS FOR USE  Sit on the edge of your bed if possible, or sit up as far as you can in bed or on a chair. Hold the incentive spirometer in an upright position. Breathe out normally. Place the mouthpiece in your mouth and seal your lips tightly around it. Breathe in slowly and as deeply as possible, raising the piston or the ball toward the top of the column. Hold your breath for 3-5 seconds or for as long as possible. Allow the piston or ball to fall to the bottom of the column. Remove the mouthpiece from your mouth and breathe out normally. Rest for a few seconds and repeat Steps 1 through 7 at least 10 times every 1-2 hours when you are awake. Take your time and take a few normal breaths between deep breaths. The spirometer may include an indicator to show your best effort. Use the indicator as a goal to work toward during each repetition. After each set of 10 deep breaths, practice coughing to be sure your lungs are clear. If you have an incision (the cut made at the time of surgery), support your incision when coughing by placing a pillow or rolled up towels firmly against it. Once you are able to get out of bed, walk around indoors and cough well. You may stop using the incentive spirometer when instructed by your caregiver.  RISKS AND COMPLICATIONS Take your time so you do not get dizzy or light-headed. If you are in pain, you may need to take or ask for pain medication before doing incentive spirometry. It is harder to take a deep breath if you are having pain. AFTER USE Rest and breathe slowly and easily. It can be helpful to keep track of a log of your progress. Your caregiver can provide you with a simple table to help with this. If you are using  the spirometer at home, follow these instructions: [place] IF:  You are having difficultly using the spirometer. You  have trouble using the spirometer as often as instructed. Your pain medication is not giving enough relief while using the spirometer. You develop fever of 100.5 F (38.1 C) or higher. SEEK IMMEDIATE MEDICAL CARE IF:  You cough up bloody sputum that had not been present before. You develop fever of 102 F (38.9 C) or greater. You develop worsening pain at or near the incision site. MAKE SURE YOU:  Understand these instructions. Will watch your condition. Will get help right away if you are not doing well or get worse. Document Released: 06/04/2006 Document Revised: 04/16/2011 Document Reviewed: 08/05/2006 [facility] Patient Information 2014 [place], [place].   ________________________________________________________________________

## 2020-11-18 ENCOUNTER — Encounter (HOSPITAL_COMMUNITY): Payer: Self-pay

## 2020-11-18 ENCOUNTER — Encounter (HOSPITAL_COMMUNITY)
Admission: RE | Admit: 2020-11-18 | Discharge: 2020-11-18 | Disposition: A | Discharge status: Home / Self Care | Payer: Medicare HMO | Source: Ambulatory Visit | Attending: Orthopaedic Surgery | Admitting: Orthopaedic Surgery

## 2020-11-18 ENCOUNTER — Other Ambulatory Visit: Payer: Self-pay

## 2020-11-18 DIAGNOSIS — Z96659 Presence of unspecified artificial knee joint: Secondary | ICD-10-CM

## 2020-11-18 DIAGNOSIS — Z01812 Encounter for preprocedural laboratory examination: Secondary | ICD-10-CM | POA: Diagnosis not present

## 2020-11-18 DIAGNOSIS — T84018D Broken internal joint prosthesis, other site, subsequent encounter: Secondary | ICD-10-CM

## 2020-11-18 LAB — CBC
HCT: 37 % (ref 36.0–46.0)
Hemoglobin: 11.8 g/dL — ABNORMAL LOW (ref 12.0–15.0)
MCH: 29 pg (ref 26.0–34.0)
MCHC: 31.9 g/dL (ref 30.0–36.0)
MCV: 90.9 fL (ref 80.0–100.0)
Platelets: 219 10*3/uL (ref 150–400)
RBC: 4.07 MIL/uL (ref 3.87–5.11)
RDW: 12.6 % (ref 11.5–15.5)
WBC: 3.8 10*3/uL — ABNORMAL LOW (ref 4.0–10.5)
nRBC: 0 % (ref 0.0–0.2)

## 2020-11-18 LAB — BASIC METABOLIC PANEL
Anion gap: 6 (ref 5–15)
BUN: 12 mg/dL (ref 8–23)
CO2: 27 mmol/L (ref 22–32)
Calcium: 9 mg/dL (ref 8.9–10.3)
Chloride: 105 mmol/L (ref 98–111)
Creatinine, Ser: 0.81 mg/dL (ref 0.44–1.00)
GFR, Estimated: >60 mL/min (ref >60)
Glucose, Bld: 88 mg/dL (ref 70–99)
Potassium: 4.1 mmol/L (ref 3.5–5.1)
Sodium: 138 mmol/L (ref 135–145)

## 2020-11-18 NOTE — Progress Notes (Addendum)
COVID test 11/23/20  PCP - Dr. Harless Litten NP [place] 09/13/20 Cardiologist - none  Chest x-ray - no EKG - 03/28/20-epic Stress Test - no ECHO - 2016 Cardiac Cath - na Pacemaker/ICD device last checked:na  Sleep Study - yes CPAP - yes  Fasting Blood Sugar - Na Checks Blood Sugar _____ times a day  Blood Thinner Instructions:ASA 81/ Dr. Tylene Fantasia Aspirin Instructions:none but Pt was told for the last hip surgery so she stopped on her own. She will call Dr. Trevor Mace office to verify Last Dose:11/17/20  Anesthesia review: yes  Patient denies shortness of breath, fever, cough and chest pain at PAT appointment Pt uses a walker and has lymphedema of legs. She reports no SOB doing housework or with ADLs. Pt's chart indicates difficult intubation. Pt is not aware of any complication with anestheria and she never got any letter indicating that she had a history of difficult airway.  Patient verbalized understanding of instructions that were given to them at the PAT appointment. Patient was also instructed that they will need to review over the PAT instructions again at home before surgery. yes

## 2020-11-21 DIAGNOSIS — H401132 Primary open-angle glaucoma, bilateral, moderate stage: Secondary | ICD-10-CM | POA: Diagnosis not present

## 2020-11-21 LAB — SURGICAL PCR SCREEN
MRSA, PCR: NEGATIVE
Staphylococcus aureus: NEGATIVE

## 2020-11-23 ENCOUNTER — Other Ambulatory Visit: Payer: Self-pay | Admitting: Orthopaedic Surgery

## 2020-11-23 LAB — SARS CORONAVIRUS 2 (TAT 6-24 HRS): SARS Coronavirus 2: NEGATIVE

## 2020-11-24 DIAGNOSIS — T84093D Other mechanical complication of internal left knee prosthesis, subsequent encounter: Secondary | ICD-10-CM

## 2020-11-24 NOTE — Anesthesia Preprocedure Evaluation (Addendum)
Anesthesia Evaluation  Patient identified by MRN, date of birth, ID band Patient awake    Reviewed: Allergy & Precautions, NPO status , Patient's Chart, lab work & pertinent test results  History of Anesthesia Complications (+) DIFFICULT AIRWAY and history of anesthetic complications  Airway Mallampati: III  TM Distance: >3 FB Neck ROM: Full    Dental no notable dental hx.    Pulmonary sleep apnea , former smoker,    Pulmonary exam normal        Cardiovascular hypertension, Pt. on medications Normal cardiovascular exam     Neuro/Psych Anxiety Depression negative neurological ROS     GI/Hepatic Neg liver ROS, GERD  Medicated and Controlled,  Endo/Other  negative endocrine ROS  Renal/GU negative Renal ROS  negative genitourinary   Musculoskeletal  (+) Arthritis ,   Abdominal   Peds  Hematology Hgb 11.8, plt 219k   Anesthesia Other Findings Day of surgery medications reviewed with patient.  Reproductive/Obstetrics negative OB ROS                            Anesthesia Physical Anesthesia Plan  ASA: 2  Anesthesia Plan: Spinal   Post-op Pain Management:  Regional for Post-op pain   Induction:   PONV Risk Score and Plan: 3 and Treatment may vary due to age or medical condition, Ondansetron, Propofol infusion and Dexamethasone  Airway Management Planned: Natural Airway and Simple Face Mask  Additional Equipment: None  Intra-op Plan:   Post-operative Plan:   Informed Consent: I have reviewed the patients History and Physical, chart, labs and discussed the procedure including the risks, benefits and alternatives for the proposed anesthesia with the patient or authorized representative who has indicated his/her understanding and acceptance.       Plan Discussed with: CRNA  Anesthesia Plan Comments:        Anesthesia Quick Evaluation

## 2020-11-24 NOTE — H&P (Signed)
TOTAL KNEE REVISION ADMISSION H&P  Patient is being admitted for left revision total knee arthroplasty.  Subjective:  Chief Complaint:left knee pain.  HPI: Taylor Mccall, 75 y.o. female, has a history of pain and functional disability in the left knee(s) due to failed previous arthroplasty and patient has failed non-surgical conservative treatments for greater than 12 weeks to include NSAID's and/or analgesics, flexibility and strengthening excercises, supervised PT with diminished ADL's post treatment, use of assistive devices, weight reduction as appropriate, and activity modification. The indications for the revision of the total knee arthroplasty are loosening of one or more components and bearing surface wear leading to symptomatic synovitis and implant or knee misalignment. Onset of symptoms was gradual starting 2 years ago with gradually worsening course since that time.  Prior procedures on the left knee(s) include arthroplasty.  Patient currently rates pain in the left knee(s) at 10 out of 10 with activity. There is night pain, worsening of pain with activity and weight bearing, pain that interferes with activities of daily living, pain with passive range of motion, and joint swelling.  Patient has evidence of prosthetic loosening by imaging studies. This condition presents safety issues increasing the risk of falls.  There is no current active infection.  Patient Active Problem List   Diagnosis Date Noted   Failed total knee, left, subsequent encounter 11/24/2020   Status post revision of total hip 04/01/2020   Polyethylene liner wear following total hip arthroplasty requiring isolated polyethylene liner exchange ([place]) 03/31/2020   Intermediate stage nonexudative age-related macular degeneration of right eye 12/23/2019   Exudative age-related macular degeneration of left eye with inactive choroidal neovascularization ([place]) 12/23/2019   Primary open angle glaucoma of both eyes,  moderate stage 12/23/2019   Exudative retinopathy of left eye 12/23/2019   Routine general medical examination at a health care facility 12/11/2016   Primary osteoarthritis of left hip 05/01/2016   Autoimmune disease ([place]) 11/29/2015   High risk medication use 11/29/2015   Vitamin D deficiency 11/29/2015   Macular degeneration 11/29/2015   Bilateral lower extremity edema 10/26/2015   H/O total knee replacement, bilateral 12/09/2013   Breast mass, left 01/18/2011   Obesity 12/08/2009   Venous (peripheral) insufficiency 07/28/2009   PAIN IN JOINT, ANKLE AND FOOT 01/12/2008   Obstructive sleep apnea 08/29/2007   GAIT DISTURBANCE 08/14/2007   Depression 08/26/2006   Essential hypertension 08/26/2006   Primary osteoarthritis of both hands 08/26/2006   Urinary incontinence 08/26/2006   Past Medical History:  Diagnosis Date   Anxiety    Blood transfusion without reported diagnosis    Breast mass, left    Cataract    Depression    Difficult intubation 04/01/2020   Known difficult airway from previous records   Gait disturbance    GERD (gastroesophageal reflux disease)    Hypertension    Lymphedema of both lower extremities    Seeing OT at [facility] to Left Lower leg   Obesity    Rheumatoid arthritis(714.0)    Sleep apnea    cpap   Urinary incontinence    Venous insufficiency     Past Surgical History:  Procedure Laterality Date   ABDOMINAL HYSTERECTOMY     with vesicovaginal fistula closure   ANTERIOR HIP REVISION Left 04/01/2020   Procedure: ANTERIOR LEFT HIP REVISION OF ACETABULAR COMPONENT;  Surgeon: Mcarthur Rossetti, MD;  Location: [facility];  Service: Orthopedics;  Laterality: Left;  3E   BLADDER SUSPENSION  2009  sling   BREAST REDUCTION SURGERY     COLONOSCOPY     ESOPHAGOGASTRODUODENOSCOPY     EYE SURGERY     REPLACEMENT TOTAL KNEE Left    TOE SURGERY Left    Left great toe   toenail removal Right    all 5 toenails   TOTAL  HIP ARTHROPLASTY     TOTAL KNEE ARTHROPLASTY Right 12/09/2013   Procedure: Right Total Knee Arthroplasty;  Surgeon: Newt Minion, MD;  Location: [facility];  Service: Orthopedics;  Laterality: Right;    No current facility-administered medications for this encounter.   Current Outpatient Medications  Medication Sig Dispense Refill Last Dose   ALEVE 220 MG CAPS Take 220-440 mg by mouth 2 (two) times daily as needed (pain.).      aspirin EC 81 MG tablet Take 81 mg by mouth daily. Swallow whole.      Biotin w/ Vitamins C & E (HAIR SKIN & NAILS GUMMIES PO) Take 2 tablets by mouth daily.      brimonidine (ALPHAGAN) 0.2 % ophthalmic solution Place 1 drop into both eyes 2 (two) times daily.      carboxymethylcellulose (REFRESH PLUS) 0.5 % SOLN Place 2 drops into the right eye 4 (four) times daily as needed (for itchy eyes).      cholecalciferol (VITAMIN D) 25 MCG (1000 UNIT) tablet Take 1,000 Units by mouth daily.      Cholecalciferol (VITAMIN D) 50 MCG (2000 UT) tablet Take 2,000 Units by mouth daily. Chew      furosemide (LASIX) 20 MG tablet Take 1 tablet (20 mg total) by mouth 2 (two) times daily. Morning & Midday 180 tablet 3    hydroxychloroquine (PLAQUENIL) 200 MG tablet TAKE 1 TABLET BY MOUTH TWICE A DAY 180 tablet 0    KLOR-CON M20 20 MEQ tablet TAKE 2 TABLETS (40 MEQ TOTAL) BY MOUTH EVERY MORNING. 180 tablet 3    latanoprost (XALATAN) 0.005 % ophthalmic solution Place 1 drop into both eyes at bedtime.      losartan (COZAAR) 25 MG tablet TAKE 1 TABLET BY MOUTH EVERY DAY 90 tablet 3    Multiple Vitamins-Minerals (MULTIVITAMIN WITH MINERALS) tablet Take 1 tablet by mouth daily.      Multiple Vitamins-Minerals (PRESERVISION AREDS 2+MULTI VIT PO) Take 1 capsule by mouth in the morning and at bedtime.      Omega-3 Fatty Acids (FISH OIL) 1200 MG CAPS Take 2,400 mg by mouth daily.      pantoprazole (PROTONIX) 40 MG tablet TAKE ONE TABLET BY MOUTH ONCE DAILY BEFORE BREAKFAST 90 tablet 3    rosuvastatin  (CRESTOR) 5 MG tablet Take 1 tablet (5 mg total) by mouth at bedtime. 90 tablet 0    sertraline (ZOLOFT) 100 MG tablet TAKE 1 TABLET BY MOUTH EVERY DAY IN THE MORNING 90 tablet 3    vitamin B-12 (CYANOCOBALAMIN) 500 MCG tablet Take 1,000 mcg by mouth daily. Gummy      vitamin C (ASCORBIC ACID) 250 MG tablet Take 500 mg by mouth daily. (Gummy)      vitamin E 180 MG (400 UNITS) capsule Take 400 Units by mouth daily.      Allergies  Allergen Reactions   Penicillins Rash    Social History   Tobacco Use   Smoking status: Former    Packs/day: 0.30    Years: 10.00    Pack years: 3.00    Types: Cigarettes    Quit date: 02/05/1978    Years since quitting: 50.8  Smokeless tobacco: Never   Tobacco comments:    over 25 years ago...pt doesnt remember when she quit.   Substance Use Topics   Alcohol use: No    Family History  Problem Relation Age of Onset   Diabetes Other    Stroke Other    Asthma Brother    Heart disease Brother    Coronary artery disease Father    Skin cancer Father    Heart disease Father    Coronary artery disease Brother    Colon cancer Neg Hx    Esophageal cancer Neg Hx    Rectal cancer Neg Hx    Stomach cancer Neg Hx       Review of Systems  Musculoskeletal:  Positive for gait problem and joint swelling.  All other systems reviewed and are negative.   Objective:  Physical Exam Vitals reviewed.  Constitutional:      Appearance: Normal appearance.  HENT:     Head: Normocephalic and atraumatic.  Eyes:     Extraocular Movements: Extraocular movements intact.     Pupils: Pupils are equal, round, and reactive to light.  Cardiovascular:     Rate and Rhythm: Normal rate and regular rhythm.     Pulses: Normal pulses.  Pulmonary:     Effort: Pulmonary effort is normal.     Breath sounds: Normal breath sounds.  Abdominal:     Palpations: Abdomen is soft.  Musculoskeletal:     Cervical back: Normal range of motion and neck supple.     Left knee: Effusion  present. Tenderness present over the medial joint line. MCL laxity present. Abnormal alignment.  Neurological:     Mental Status: She is alert and oriented to person, place, and time.  Psychiatric:        Behavior: Behavior normal.    Vital signs in last 24 hours:    Labs:  Estimated body mass index is 35.3 kg/m as calculated from the following:   Height as of 11/18/20: 5\' 10"  (1.778 m).   Weight as of 11/18/20: 111.6 kg.  Imaging Review Plain radiographs demonstrate evidence of loosening of the tibial components. The bone quality appears to be good for age and reported activity level.    Assessment/Plan:  left knee(s) with failed previous arthroplasty.   The patient history, physical examination, clinical judgment of the provider and imaging studies are consistent with end stage degenerative joint disease of the left knee(s), previous total knee arthroplasty. Revision total knee arthroplasty is deemed medically necessary. The treatment options including medical management, injection therapy, arthroscopy and revision arthroplasty were discussed at length. The risks and benefits of revision total knee arthroplasty were presented and reviewed. The risks due to aseptic loosening, infection, stiffness, patella tracking problems, thromboembolic complications and other imponderables were discussed. The patient acknowledged the explanation, agreed to proceed with the plan and consent was signed. Patient is being admitted for inpatient treatment for surgery, pain control, PT, OT, prophylactic antibiotics, VTE prophylaxis, progressive ambulation and ADL's and discharge planning.The patient is planning to be discharged home with home health services

## 2020-11-25 ENCOUNTER — Inpatient Hospital Stay (HOSPITAL_COMMUNITY)
Admission: RE | Admit: 2020-11-25 | Discharge: 2020-11-29 | DRG: 468 | Disposition: A | Discharge status: Home Health | Payer: Medicare HMO | Attending: Orthopaedic Surgery | Admitting: Orthopaedic Surgery

## 2020-11-25 ENCOUNTER — Inpatient Hospital Stay (HOSPITAL_COMMUNITY): Payer: Medicare HMO

## 2020-11-25 ENCOUNTER — Inpatient Hospital Stay (HOSPITAL_COMMUNITY): Payer: Medicare HMO | Admitting: Anesthesiology

## 2020-11-25 ENCOUNTER — Encounter (HOSPITAL_COMMUNITY): Payer: Self-pay | Admitting: Orthopaedic Surgery

## 2020-11-25 ENCOUNTER — Other Ambulatory Visit: Payer: Self-pay

## 2020-11-25 ENCOUNTER — Encounter (HOSPITAL_COMMUNITY)
Admission: RE | Disposition: A | Discharge status: Home Health | Payer: Self-pay | Source: Home / Self Care | Attending: Orthopaedic Surgery

## 2020-11-25 DIAGNOSIS — Z79899 Other long term (current) drug therapy: Secondary | ICD-10-CM | POA: Diagnosis not present

## 2020-11-25 DIAGNOSIS — M069 Rheumatoid arthritis, unspecified: Secondary | ICD-10-CM | POA: Diagnosis present

## 2020-11-25 DIAGNOSIS — T84093D Other mechanical complication of internal left knee prosthesis, subsequent encounter: Secondary | ICD-10-CM | POA: Diagnosis not present

## 2020-11-25 DIAGNOSIS — Z880 Allergy status to penicillin: Secondary | ICD-10-CM | POA: Diagnosis not present

## 2020-11-25 DIAGNOSIS — Z471 Aftercare following joint replacement surgery: Secondary | ICD-10-CM | POA: Diagnosis not present

## 2020-11-25 DIAGNOSIS — Z7982 Long term (current) use of aspirin: Secondary | ICD-10-CM

## 2020-11-25 DIAGNOSIS — Z8249 Family history of ischemic heart disease and other diseases of the circulatory system: Secondary | ICD-10-CM

## 2020-11-25 DIAGNOSIS — R69 Illness, unspecified: Secondary | ICD-10-CM | POA: Diagnosis not present

## 2020-11-25 DIAGNOSIS — Z90710 Acquired absence of both cervix and uterus: Secondary | ICD-10-CM

## 2020-11-25 DIAGNOSIS — Z808 Family history of malignant neoplasm of other organs or systems: Secondary | ICD-10-CM

## 2020-11-25 DIAGNOSIS — F32A Depression, unspecified: Secondary | ICD-10-CM | POA: Diagnosis present

## 2020-11-25 DIAGNOSIS — R600 Localized edema: Secondary | ICD-10-CM | POA: Diagnosis not present

## 2020-11-25 DIAGNOSIS — Z20822 Contact with and (suspected) exposure to covid-19: Secondary | ICD-10-CM | POA: Diagnosis not present

## 2020-11-25 DIAGNOSIS — Z96642 Presence of left artificial hip joint: Secondary | ICD-10-CM | POA: Diagnosis present

## 2020-11-25 DIAGNOSIS — Z9889 Other specified postprocedural states: Secondary | ICD-10-CM | POA: Diagnosis not present

## 2020-11-25 DIAGNOSIS — H401132 Primary open-angle glaucoma, bilateral, moderate stage: Secondary | ICD-10-CM | POA: Diagnosis present

## 2020-11-25 DIAGNOSIS — K219 Gastro-esophageal reflux disease without esophagitis: Secondary | ICD-10-CM | POA: Diagnosis present

## 2020-11-25 DIAGNOSIS — Z833 Family history of diabetes mellitus: Secondary | ICD-10-CM | POA: Diagnosis not present

## 2020-11-25 DIAGNOSIS — Y792 Prosthetic and other implants, materials and accessory orthopedic devices associated with adverse incidents: Secondary | ICD-10-CM | POA: Diagnosis not present

## 2020-11-25 DIAGNOSIS — E559 Vitamin D deficiency, unspecified: Secondary | ICD-10-CM | POA: Diagnosis present

## 2020-11-25 DIAGNOSIS — G8918 Other acute postprocedural pain: Secondary | ICD-10-CM | POA: Diagnosis not present

## 2020-11-25 DIAGNOSIS — I10 Essential (primary) hypertension: Secondary | ICD-10-CM | POA: Diagnosis not present

## 2020-11-25 DIAGNOSIS — Z87891 Personal history of nicotine dependence: Secondary | ICD-10-CM | POA: Diagnosis not present

## 2020-11-25 DIAGNOSIS — Z96652 Presence of left artificial knee joint: Secondary | ICD-10-CM

## 2020-11-25 DIAGNOSIS — T84033A Mechanical loosening of internal left knee prosthetic joint, initial encounter: Secondary | ICD-10-CM | POA: Diagnosis not present

## 2020-11-25 HISTORY — PX: TOTAL KNEE REVISION: SHX996

## 2020-11-25 LAB — BPAM RBC
Blood Product Expiration Date: 202211212359
Blood Product Expiration Date: 202211222359
Unit Type and Rh: 5100
Unit Type and Rh: 7300

## 2020-11-25 LAB — TYPE AND SCREEN
ABO/RH(D): B POS
Antibody Screen: POSITIVE
Unit division: 0
Unit division: 0

## 2020-11-25 SURGERY — TOTAL KNEE REVISION
Anesthesia: Spinal | Site: Knee | Laterality: Left

## 2020-11-25 MED ORDER — DIPHENHYDRAMINE HCL 12.5 MG/5ML PO ELIX: 12.5000 mg | ORAL_SOLUTION | ORAL | Status: DC | PRN

## 2020-11-25 MED ORDER — SODIUM CHLORIDE 0.9 % IV SOLN: INTRAVENOUS | Status: DC

## 2020-11-25 MED ORDER — PANTOPRAZOLE SODIUM 40 MG PO TBEC
40.0000 mg | DELAYED_RELEASE_TABLET | Freq: Every day | ORAL | Status: DC
  Administered 2020-11-26 – 2020-11-29 (×4): 40 mg via ORAL
  Filled 2020-11-25 (×4): qty 1

## 2020-11-25 MED ORDER — BRIMONIDINE TARTRATE 0.2 % OP SOLN
1.0000 [drp] | Freq: Two times a day (BID) | OPHTHALMIC | Status: DC
  Administered 2020-11-26 – 2020-11-29 (×6): 1 [drp] via OPHTHALMIC
  Filled 2020-11-25: qty 5

## 2020-11-25 MED ORDER — PROPOFOL 500 MG/50ML IV EMUL
INTRAVENOUS | Status: AC
  Filled 2020-11-25: qty 50

## 2020-11-25 MED ORDER — ONDANSETRON HCL 4 MG PO TABS
4.0000 mg | ORAL_TABLET | Freq: Four times a day (QID) | ORAL | Status: DC | PRN
  Administered 2020-11-26: 4 mg via ORAL
  Filled 2020-11-25: qty 1

## 2020-11-25 MED ORDER — DEXAMETHASONE SODIUM PHOSPHATE 10 MG/ML IJ SOLN
INTRAMUSCULAR | Status: DC | PRN
  Administered 2020-11-25: 10 mg via INTRAVENOUS

## 2020-11-25 MED ORDER — ALUM & MAG HYDROXIDE-SIMETH 200-200-20 MG/5ML PO SUSP: 30.0000 mL | ORAL | Status: DC | PRN

## 2020-11-25 MED ORDER — PHENYLEPHRINE HCL-NACL 20-0.9 MG/250ML-% IV SOLN
INTRAVENOUS | Status: DC | PRN
Start: 2020-11-25 — End: 2020-11-25
  Administered 2020-11-25: 50 ug/min via INTRAVENOUS

## 2020-11-25 MED ORDER — HYDROMORPHONE HCL 1 MG/ML IJ SOLN: 0.5000 mg | INTRAMUSCULAR | Status: DC | PRN

## 2020-11-25 MED ORDER — STERILE WATER FOR IRRIGATION IR SOLN
Status: DC | PRN
  Administered 2020-11-25: 2000 mL

## 2020-11-25 MED ORDER — BUPIVACAINE-EPINEPHRINE (PF) 0.5% -1:200000 IJ SOLN
INTRAMUSCULAR | Status: DC | PRN
  Administered 2020-11-25: 15 mL via PERINEURAL

## 2020-11-25 MED ORDER — OXYCODONE HCL 5 MG PO TABS
10.0000 mg | ORAL_TABLET | ORAL | Status: DC | PRN
  Administered 2020-11-25: 10 mg via ORAL
  Administered 2020-11-25: 15 mg via ORAL
  Administered 2020-11-26: 10 mg via ORAL
  Administered 2020-11-26: 15 mg via ORAL
  Filled 2020-11-25 (×2): qty 2
  Filled 2020-11-25 (×2): qty 3

## 2020-11-25 MED ORDER — BUPIVACAINE IN DEXTROSE 0.75-8.25 % IT SOLN
INTRATHECAL | Status: DC | PRN
  Administered 2020-11-25: 2 mL via INTRATHECAL

## 2020-11-25 MED ORDER — POVIDONE-IODINE 10 % EX SWAB
2.0000 | Freq: Once | CUTANEOUS | Status: AC
Start: 2020-11-25 — End: 2020-11-25
  Administered 2020-11-25: 2 via TOPICAL

## 2020-11-25 MED ORDER — PROPOFOL 500 MG/50ML IV EMUL
INTRAVENOUS | Status: DC | PRN
  Administered 2020-11-25: 75 ug/kg/min via INTRAVENOUS

## 2020-11-25 MED ORDER — VITAMIN D 25 MCG (1000 UNIT) PO TABS
1000.0000 [IU] | ORAL_TABLET | Freq: Every day | ORAL | Status: DC
  Administered 2020-11-26 – 2020-11-29 (×4): 1000 [IU] via ORAL
  Filled 2020-11-25 (×4): qty 1

## 2020-11-25 MED ORDER — METOCLOPRAMIDE HCL 5 MG PO TABS
5.0000 mg | ORAL_TABLET | Freq: Three times a day (TID) | ORAL | Status: DC | PRN
Start: 2020-11-25 — End: 2020-11-29

## 2020-11-25 MED ORDER — DEXAMETHASONE SODIUM PHOSPHATE 10 MG/ML IJ SOLN
INTRAMUSCULAR | Status: AC
  Filled 2020-11-25: qty 1

## 2020-11-25 MED ORDER — PHENOL 1.4 % MT LIQD: 1.0000 | OROMUCOSAL | Status: DC | PRN

## 2020-11-25 MED ORDER — POTASSIUM CHLORIDE CRYS ER 20 MEQ PO TBCR
40.0000 meq | EXTENDED_RELEASE_TABLET | Freq: Every day | ORAL | Status: DC
  Administered 2020-11-26 – 2020-11-29 (×4): 40 meq via ORAL
  Filled 2020-11-25 (×4): qty 2

## 2020-11-25 MED ORDER — CLONIDINE HCL (ANALGESIA) 100 MCG/ML EP SOLN
EPIDURAL | Status: DC | PRN
  Administered 2020-11-25: 100 ug

## 2020-11-25 MED ORDER — ONDANSETRON HCL 4 MG/2ML IJ SOLN
INTRAMUSCULAR | Status: DC | PRN
Start: 2020-11-25 — End: 2020-11-25
  Administered 2020-11-25: 4 mg via INTRAVENOUS

## 2020-11-25 MED ORDER — LACTATED RINGERS IV SOLN: INTRAVENOUS | Status: DC

## 2020-11-25 MED ORDER — PROPOFOL 10 MG/ML IV BOLUS
INTRAVENOUS | Status: AC
  Filled 2020-11-25: qty 20

## 2020-11-25 MED ORDER — FENTANYL CITRATE PF 50 MCG/ML IJ SOSY
50.0000 ug | PREFILLED_SYRINGE | Freq: Once | INTRAMUSCULAR | Status: AC
  Administered 2020-11-25: 50 ug via INTRAVENOUS
  Filled 2020-11-25: qty 2

## 2020-11-25 MED ORDER — ORAL CARE MOUTH RINSE: 15.0000 mL | Freq: Once | OROMUCOSAL | Status: AC

## 2020-11-25 MED ORDER — MENTHOL 3 MG MT LOZG: 1.0000 | LOZENGE | OROMUCOSAL | Status: DC | PRN

## 2020-11-25 MED ORDER — SERTRALINE HCL 100 MG PO TABS
100.0000 mg | ORAL_TABLET | Freq: Every day | ORAL | Status: DC
  Administered 2020-11-26 – 2020-11-29 (×4): 100 mg via ORAL
  Filled 2020-11-25 (×4): qty 1

## 2020-11-25 MED ORDER — VITAMIN D 50 MCG (2000 UT) PO TABS: 2000.0000 [IU] | ORAL_TABLET | Freq: Every day | ORAL | Status: DC

## 2020-11-25 MED ORDER — FENTANYL CITRATE (PF) 100 MCG/2ML IJ SOLN
INTRAMUSCULAR | Status: AC
  Filled 2020-11-25: qty 2

## 2020-11-25 MED ORDER — METOCLOPRAMIDE HCL 5 MG/ML IJ SOLN
5.0000 mg | Freq: Three times a day (TID) | INTRAMUSCULAR | Status: DC | PRN
Start: 2020-11-25 — End: 2020-11-29

## 2020-11-25 MED ORDER — POLYVINYL ALCOHOL 1.4 % OP SOLN
2.0000 [drp] | Freq: Four times a day (QID) | OPHTHALMIC | Status: DC | PRN
  Filled 2020-11-25: qty 15

## 2020-11-25 MED ORDER — OXYCODONE HCL 5 MG PO TABS: 5.0000 mg | ORAL_TABLET | Freq: Once | ORAL | Status: DC | PRN

## 2020-11-25 MED ORDER — ADULT MULTIVITAMIN W/MINERALS CH
1.0000 | ORAL_TABLET | Freq: Every day | ORAL | Status: DC
  Administered 2020-11-26 – 2020-11-29 (×4): 1 via ORAL
  Filled 2020-11-25 (×4): qty 1

## 2020-11-25 MED ORDER — VITAMIN E 45 MG (100 UNIT) PO CAPS
400.0000 [IU] | ORAL_CAPSULE | Freq: Every day | ORAL | Status: DC
  Administered 2020-11-26 – 2020-11-29 (×4): 400 [IU] via ORAL
  Filled 2020-11-25 (×4): qty 4

## 2020-11-25 MED ORDER — ONDANSETRON HCL 4 MG/2ML IJ SOLN
INTRAMUSCULAR | Status: AC
  Filled 2020-11-25: qty 2

## 2020-11-25 MED ORDER — MIDAZOLAM HCL 2 MG/2ML IJ SOLN
1.0000 mg | Freq: Once | INTRAMUSCULAR | Status: DC
  Filled 2020-11-25: qty 2

## 2020-11-25 MED ORDER — LATANOPROST 0.005 % OP SOLN
1.0000 [drp] | Freq: Every day | OPHTHALMIC | Status: DC
  Administered 2020-11-25 – 2020-11-28 (×4): 1 [drp] via OPHTHALMIC
  Filled 2020-11-25: qty 2.5

## 2020-11-25 MED ORDER — LIDOCAINE HCL (PF) 2 % IJ SOLN
INTRAMUSCULAR | Status: AC
  Filled 2020-11-25: qty 5

## 2020-11-25 MED ORDER — CLINDAMYCIN PHOSPHATE 600 MG/50ML IV SOLN
600.0000 mg | Freq: Four times a day (QID) | INTRAVENOUS | Status: AC
Start: 2020-11-25 — End: 2020-11-26
  Administered 2020-11-25 (×2): 600 mg via INTRAVENOUS
  Filled 2020-11-25 (×2): qty 50

## 2020-11-25 MED ORDER — METHOCARBAMOL 500 MG PO TABS
500.0000 mg | ORAL_TABLET | Freq: Four times a day (QID) | ORAL | Status: DC | PRN
  Administered 2020-11-25 – 2020-11-28 (×5): 500 mg via ORAL
  Filled 2020-11-25 (×5): qty 1

## 2020-11-25 MED ORDER — FENTANYL CITRATE PF 50 MCG/ML IJ SOSY: 25.0000 ug | PREFILLED_SYRINGE | INTRAMUSCULAR | Status: DC | PRN

## 2020-11-25 MED ORDER — ONDANSETRON HCL 4 MG/2ML IJ SOLN
4.0000 mg | Freq: Four times a day (QID) | INTRAMUSCULAR | Status: DC | PRN
  Administered 2020-11-26: 4 mg via INTRAVENOUS
  Filled 2020-11-25: qty 2

## 2020-11-25 MED ORDER — SODIUM CHLORIDE 0.9 % IR SOLN
Status: DC | PRN
  Administered 2020-11-25 (×2): 1000 mL

## 2020-11-25 MED ORDER — ASCORBIC ACID 500 MG PO TABS
500.0000 mg | ORAL_TABLET | Freq: Every day | ORAL | Status: DC
  Administered 2020-11-26 – 2020-11-29 (×4): 500 mg via ORAL
  Filled 2020-11-25 (×4): qty 1

## 2020-11-25 MED ORDER — ACETAMINOPHEN 325 MG PO TABS
325.0000 mg | ORAL_TABLET | Freq: Four times a day (QID) | ORAL | Status: DC | PRN
  Administered 2020-11-26 – 2020-11-29 (×3): 650 mg via ORAL
  Filled 2020-11-25 (×3): qty 2

## 2020-11-25 MED ORDER — ACETAMINOPHEN 500 MG PO TABS
1000.0000 mg | ORAL_TABLET | Freq: Once | ORAL | Status: AC
  Administered 2020-11-25: 1000 mg via ORAL
  Filled 2020-11-25: qty 2

## 2020-11-25 MED ORDER — CLINDAMYCIN PHOSPHATE 900 MG/50ML IV SOLN
900.0000 mg | INTRAVENOUS | Status: AC
  Administered 2020-11-25: 900 mg via INTRAVENOUS
  Filled 2020-11-25: qty 50

## 2020-11-25 MED ORDER — CHLORHEXIDINE GLUCONATE 0.12 % MT SOLN
15.0000 mL | Freq: Once | OROMUCOSAL | Status: AC
  Administered 2020-11-25: 15 mL via OROMUCOSAL

## 2020-11-25 MED ORDER — FUROSEMIDE 20 MG PO TABS
20.0000 mg | ORAL_TABLET | Freq: Two times a day (BID) | ORAL | Status: DC
  Administered 2020-11-26 – 2020-11-29 (×8): 20 mg via ORAL
  Filled 2020-11-25 (×8): qty 1

## 2020-11-25 MED ORDER — METHOCARBAMOL 500 MG IVPB - SIMPLE MED
500.0000 mg | Freq: Four times a day (QID) | INTRAVENOUS | Status: DC | PRN
  Filled 2020-11-25: qty 50

## 2020-11-25 MED ORDER — OXYCODONE HCL 5 MG PO TABS
5.0000 mg | ORAL_TABLET | ORAL | Status: DC | PRN
  Administered 2020-11-26 – 2020-11-27 (×2): 10 mg via ORAL
  Administered 2020-11-27 – 2020-11-29 (×3): 5 mg via ORAL
  Filled 2020-11-25: qty 1
  Filled 2020-11-25: qty 2
  Filled 2020-11-25: qty 1
  Filled 2020-11-25: qty 2
  Filled 2020-11-25: qty 1
  Filled 2020-11-25 (×2): qty 2

## 2020-11-25 MED ORDER — LOSARTAN POTASSIUM 25 MG PO TABS
25.0000 mg | ORAL_TABLET | Freq: Every day | ORAL | Status: DC
  Administered 2020-11-25 – 2020-11-29 (×5): 25 mg via ORAL
  Filled 2020-11-25 (×5): qty 1

## 2020-11-25 MED ORDER — ROSUVASTATIN CALCIUM 5 MG PO TABS
5.0000 mg | ORAL_TABLET | Freq: Every day | ORAL | Status: DC
  Administered 2020-11-25 – 2020-11-28 (×4): 5 mg via ORAL
  Filled 2020-11-25 (×4): qty 1

## 2020-11-25 MED ORDER — FENTANYL CITRATE (PF) 100 MCG/2ML IJ SOLN
INTRAMUSCULAR | Status: DC | PRN
  Administered 2020-11-25 (×2): 50 ug via INTRAVENOUS
  Administered 2020-11-25: 25 ug via INTRAVENOUS
  Administered 2020-11-25: 50 ug via INTRAVENOUS
  Administered 2020-11-25: 25 ug via INTRAVENOUS

## 2020-11-25 MED ORDER — DOCUSATE SODIUM 100 MG PO CAPS
100.0000 mg | ORAL_CAPSULE | Freq: Two times a day (BID) | ORAL | Status: DC
  Administered 2020-11-25 – 2020-11-29 (×8): 100 mg via ORAL
  Filled 2020-11-25 (×8): qty 1

## 2020-11-25 MED ORDER — PHENYLEPHRINE HCL (PRESSORS) 10 MG/ML IV SOLN
INTRAVENOUS | Status: AC
  Filled 2020-11-25: qty 2

## 2020-11-25 MED ORDER — VITAMIN B-12 1000 MCG PO TABS
1000.0000 ug | ORAL_TABLET | Freq: Every day | ORAL | Status: DC
  Administered 2020-11-26 – 2020-11-29 (×4): 1000 ug via ORAL
  Filled 2020-11-25: qty 2
  Filled 2020-11-25: qty 1
  Filled 2020-11-25: qty 2
  Filled 2020-11-25: qty 1
  Filled 2020-11-25: qty 2

## 2020-11-25 MED ORDER — APIXABAN 2.5 MG PO TABS
2.5000 mg | ORAL_TABLET | Freq: Two times a day (BID) | ORAL | Status: DC
  Administered 2020-11-26 – 2020-11-29 (×7): 2.5 mg via ORAL
  Filled 2020-11-25 (×7): qty 1

## 2020-11-25 MED ORDER — TRANEXAMIC ACID-NACL 1000-0.7 MG/100ML-% IV SOLN
1000.0000 mg | INTRAVENOUS | Status: AC
  Administered 2020-11-25: 1000 mg via INTRAVENOUS
  Filled 2020-11-25: qty 100

## 2020-11-25 MED ORDER — OXYCODONE HCL 5 MG/5ML PO SOLN: 5.0000 mg | Freq: Once | ORAL | Status: DC | PRN

## 2020-11-25 SURGICAL SUPPLY — 70 items
APL SKNCLS STERI-STRIP NONHPOA (GAUZE/BANDAGES/DRESSINGS)
ATUNE CEMENTED STEM 16X80 (Stem) ×2 IMPLANT
AUG TIB 5/6 UNV 5 REV CMNT (Joint) ×2 IMPLANT
AUG TIB ATTUNE UNV SZ5 6X5 (Joint) ×4 IMPLANT
AUGMENT TIB ATTUNE UNV SZ5 6X5 (Joint) ×2 IMPLANT
BAG COUNTER SPONGE SURGICOUNT (BAG) IMPLANT
BAG SPEC THK2 15X12 ZIP CLS (MISCELLANEOUS)
BAG SPNG CNTER NS LX DISP (BAG)
BAG ZIPLOCK 12X15 (MISCELLANEOUS) IMPLANT
BENZOIN TINCTURE PRP APPL 2/3 (GAUZE/BANDAGES/DRESSINGS) IMPLANT
BLADE SAG 18X100X1.27 (BLADE) ×2 IMPLANT
BLADE SAW SGTL 13.0X1.19X90.0M (BLADE) ×2 IMPLANT
BLADE SURG SZ10 CARB STEEL (BLADE) ×4 IMPLANT
BNDG ELASTIC 6X5.8 VLCR STR LF (GAUZE/BANDAGES/DRESSINGS) ×4 IMPLANT
BSPLAT TIB 5 CMNT REV ROT PLAT (Orthopedic Implant) ×1 IMPLANT
CEMENT HV SMART SET (Cement) ×6 IMPLANT
CLOTH BEACON ORANGE TIMEOUT ST (SAFETY) ×2 IMPLANT
COMP FEM ATTUNE CRS SZ6 LT (Femur) ×2 IMPLANT
COMPONENT FEM ATN CRS SZ6 LT (Femur) ×1 IMPLANT
COOLER ICEMAN CLASSIC (MISCELLANEOUS) ×2 IMPLANT
COVER SURGICAL LIGHT HANDLE (MISCELLANEOUS) ×2 IMPLANT
CUFF TOURN SGL QUICK 34 (TOURNIQUET CUFF) ×2
CUFF TRNQT CYL 34X4.125X (TOURNIQUET CUFF) ×1 IMPLANT
DECANTER SPIKE VIAL GLASS SM (MISCELLANEOUS) IMPLANT
DRAPE INCISE IOBAN 66X45 STRL (DRAPES) ×6 IMPLANT
DRAPE U-SHAPE 47X51 STRL (DRAPES) ×2 IMPLANT
DRSG PAD ABDOMINAL 8X10 ST (GAUZE/BANDAGES/DRESSINGS) ×4 IMPLANT
DURAPREP 26ML APPLICATOR (WOUND CARE) ×2 IMPLANT
ELECT REM PT RETURN 15FT ADLT (MISCELLANEOUS) ×2 IMPLANT
EVACUATOR 1/8 PVC DRAIN (DRAIN) IMPLANT
GAUZE SPONGE 4X4 12PLY STRL (GAUZE/BANDAGES/DRESSINGS) ×2 IMPLANT
GAUZE XEROFORM 5X9 LF (GAUZE/BANDAGES/DRESSINGS) ×2 IMPLANT
GLOVE SRG 8 PF TXTR STRL LF DI (GLOVE) ×2 IMPLANT
GLOVE SURG ENC MOIS LTX SZ7.5 (GLOVE) ×2 IMPLANT
GLOVE SURG NEOPR MICRO LF SZ8 (GLOVE) ×2 IMPLANT
GLOVE SURG UNDER POLY LF SZ8 (GLOVE) ×4
GOWN STRL REUS W/TWL XL LVL3 (GOWN DISPOSABLE) ×4 IMPLANT
HANDPIECE INTERPULSE COAX TIP (DISPOSABLE) ×2
HOLDER FOLEY CATH W/STRAP (MISCELLANEOUS) IMPLANT
IMMOBILIZER KNEE 20 (SOFTGOODS) ×2
IMMOBILIZER KNEE 20 THIGH 36 (SOFTGOODS) ×1 IMPLANT
INSERT LCS 3 PEG STD (Knees) ×2 IMPLANT
INSERT TIB CRS ATTUNE SZ6 16 (Insert) ×2 IMPLANT
NDL SAFETY ECLIPSE 18X1.5 (NEEDLE) IMPLANT
NEEDLE HYPO 18GX1.5 SHARP (NEEDLE)
PACK TOTAL KNEE CUSTOM (KITS) ×2 IMPLANT
PAD COLD SHLDR WRAP-ON (PAD) ×2 IMPLANT
PADDING CAST COTTON 6X4 STRL (CAST SUPPLIES) ×4 IMPLANT
PLATE REV TIB BAS ROT SZ5 KNEE (Orthopedic Implant) ×1 IMPLANT
PROTECTOR NERVE ULNAR (MISCELLANEOUS) ×2 IMPLANT
REV TIB BASE ROT PLAT SZ5 KNEE (Orthopedic Implant) ×2 IMPLANT
SET HNDPC FAN SPRY TIP SCT (DISPOSABLE) ×1 IMPLANT
SET PAD KNEE POSITIONER (MISCELLANEOUS) ×2 IMPLANT
SPONGE T-LAP 18X18 ~~LOC~~+RFID (SPONGE) IMPLANT
STAPLER VISISTAT 35W (STAPLE) ×2 IMPLANT
STEM ATTUNE CEMENTED 16X80 (Stem) ×1 IMPLANT
STEM REV CEMENTED 14X50MM (Stem) ×2 IMPLANT
STRIP CLOSURE SKIN 1/2X4 (GAUZE/BANDAGES/DRESSINGS) IMPLANT
SUT MNCRL AB 4-0 PS2 18 (SUTURE) IMPLANT
SUT VIC AB 0 CT1 36 (SUTURE) ×4 IMPLANT
SUT VIC AB 1 CT1 36 (SUTURE) ×6 IMPLANT
SUT VIC AB 2-0 CT1 27 (SUTURE) ×6
SUT VIC AB 2-0 CT1 TAPERPNT 27 (SUTURE) ×3 IMPLANT
SWAB COLLECTION DEVICE MRSA (MISCELLANEOUS) IMPLANT
SWAB CULTURE ESWAB REG 1ML (MISCELLANEOUS) IMPLANT
SYR 3ML LL SCALE MARK (SYRINGE) IMPLANT
TOWER CARTRIDGE SMART MIX (DISPOSABLE) IMPLANT
TRAY FOLEY MTR SLVR 16FR STAT (SET/KITS/TRAYS/PACK) ×2 IMPLANT
TUBE KAMVAC SUCTION (TUBING) IMPLANT
WATER STERILE IRR 1000ML POUR (IV SOLUTION) ×2 IMPLANT

## 2020-11-25 NOTE — Brief Op Note (Signed)
11/25/2020  2:26 PM  PATIENT:  Taylor Mccall  75 y.o. female  PRE-OPERATIVE DIAGNOSIS:  failed left total knee arthroplasty  POST-OPERATIVE DIAGNOSIS:  failed left total knee arthroplasty  PROCEDURE:  Procedure(s): LEFT TOTAL KNEE REVISION (Left)  SURGEON:  Surgeon(s) and Role:    Mcarthur Rossetti, MD - Primary  PHYSICIAN ASSISTANT: Benita Stabile, PA-C  ANESTHESIA:   regional and spinal  EBL:  100 mL   COUNTS:  YES  TOURNIQUET:   Total Tourniquet Time Documented: Thigh (Left) - 104 minutes Total: Thigh (Left) - 104 minutes   DICTATION: .Other Dictation: Dictation Number [id number]  PLAN OF CARE: Admit to inpatient   PATIENT DISPOSITION:  PACU - hemodynamically stable.   Delay start of Pharmacological VTE agent (>24hrs) due to surgical blood loss or risk of bleeding: no

## 2020-11-25 NOTE — Anesthesia Procedure Notes (Signed)
Spinal  Patient location during procedure: OR Start time: 11/25/2020 11:45 AM End time: 11/25/2020 11:48 AM Reason for block: surgical anesthesia Staffing Performed: anesthesiologist  Anesthesiologist: Brennan Bailey, MD Preanesthetic Checklist Completed: patient identified, IV checked, risks and benefits discussed, surgical consent, monitors and equipment checked, pre-op evaluation and timeout performed Spinal Block Patient position: sitting Prep: DuraPrep and site prepped and draped Patient monitoring: continuous pulse ox, blood pressure and heart rate Approach: midline Location: L3-4 Injection technique: single-shot Needle Needle type: [place]  Needle gauge: 22 G Needle length: 9 cm Assessment Events: CSF return Additional Notes Risks, benefits, and alternative discussed. Patient gave consent to procedure. Prepped and draped in sitting position. Patient sedated but responsive to voice. Clear CSF obtained on first needle pass (9cm needle hubbed at skin). Positive terminal aspiration. No pain or paraesthesias with injection. Patient tolerated procedure well. Vital signs stable. Tawny Asal, MD

## 2020-11-25 NOTE — Plan of Care (Signed)
  Problem: Pain Management: Goal: Pain level will decrease with appropriate interventions Outcome: Progressing   

## 2020-11-25 NOTE — Discharge Instructions (Addendum)

## 2020-11-25 NOTE — Transfer of Care (Signed)
Immediate Anesthesia Transfer of Care Note  Patient: Taylor Mccall  Procedure(s) Performed: LEFT TOTAL KNEE REVISION (Left: Knee)  Patient Location: PACU  Anesthesia Type:Spinal  Level of Consciousness: awake  Airway & Oxygen Therapy: Patient Spontanous Breathing and Patient connected to face mask oxygen  Post-op Assessment: Report given to RN and Post -op Vital signs reviewed and stable  Post vital signs: Reviewed and stable  Last Vitals:  Vitals Value Taken Time  BP 117/75 11/25/20 1457  Temp    Pulse 66 11/25/20 1459  Resp 14 11/25/20 1459  SpO2 100 % 11/25/20 1459  Vitals shown include unvalidated device data.  Last Pain:  Vitals:   11/25/20 0858  TempSrc:   PainSc: 6          Complications: No notable events documented.

## 2020-11-25 NOTE — Progress Notes (Signed)
Nerve block done by anesthesia MD  and Beverlee Nims CRNA

## 2020-11-25 NOTE — Interval H&P Note (Signed)
History and Physical Interval Note: The patient understands that she is here today for a left total knee revision arthroplasty due to a failed total knee replacement.  She had this left knee replaced years ago.  He has since failed with subsidence of the tibial tray and malalignment.  There has been no acute interval change in her medical status.  Please see recent H&P.  The risks and benefits of surgery have been discussed in detail and informed consent is obtained.  The left operative knee has been marked.  11/25/2020 10:29 AM  Taylor Mccall  has presented today for surgery, with the diagnosis of failed left total knee arthroplasty.  The various methods of treatment have been discussed with the patient and family. After consideration of risks, benefits and other options for treatment, the patient has consented to  Procedure(s): LEFT TOTAL KNEE REVISION (Left) as a surgical intervention.  The patient's history has been reviewed, patient examined, no change in status, stable for surgery.  I have reviewed the patient's chart and labs.  Questions were answered to the patient's satisfaction.     Mcarthur Rossetti

## 2020-11-25 NOTE — Anesthesia Procedure Notes (Signed)
Procedure Name: MAC Date/Time: 11/25/2020 11:44 AM Performed by: Niel Hummer, CRNA Pre-anesthesia Checklist: Patient identified, Emergency Drugs available, Suction available and Patient being monitored Oxygen Delivery Method: Simple face mask

## 2020-11-25 NOTE — Anesthesia Procedure Notes (Signed)
Anesthesia Regional Block: Adductor canal block   Pre-Anesthetic Checklist: , timeout performed,  Correct Patient, Correct Site, Correct Laterality,  Correct Procedure, Correct Position, site marked,  Risks and benefits discussed,  Pre-op evaluation,  At surgeon's request and post-op pain management  Laterality: Left  Prep: Maximum Sterile Barrier Precautions used, chloraprep       Needles:  Injection technique: Single-shot  Needle Type: Echogenic Stimulator Needle     Needle Length: 9cm  Needle Gauge: 22     Additional Needles:   Procedures:,,,, ultrasound used (permanent image in chart),,    Narrative:  Start time: 11/25/2020 11:10 AM End time: 11/25/2020 11:12 AM Injection made incrementally with aspirations every 5 mL.  Performed by: Personally  Anesthesiologist: Brennan Bailey, MD  Additional Notes: Risks, benefits, and alternative discussed. Patient gave consent for procedure. Patient prepped and draped in sterile fashion. Sedation administered, patient remains easily responsive to voice. Relevant anatomy identified with ultrasound guidance. Local anesthetic given in 5cc increments with no signs or symptoms of intravascular injection. No pain or paraesthesias with injection. Patient monitored throughout procedure with signs of LAST or immediate complications. Tolerated well. Ultrasound image placed in chart.  Tawny Asal, MD

## 2020-11-25 NOTE — Plan of Care (Signed)

## 2020-11-26 LAB — BASIC METABOLIC PANEL
Anion gap: 6 (ref 5–15)
BUN: 16 mg/dL (ref 8–23)
CO2: 23 mmol/L (ref 22–32)
Calcium: 8.6 mg/dL — ABNORMAL LOW (ref 8.9–10.3)
Chloride: 109 mmol/L (ref 98–111)
Creatinine, Ser: 0.59 mg/dL (ref 0.44–1.00)
GFR, Estimated: >60 mL/min (ref >60)
Glucose, Bld: 148 mg/dL — ABNORMAL HIGH (ref 70–99)
Potassium: 4 mmol/L (ref 3.5–5.1)
Sodium: 138 mmol/L (ref 135–145)

## 2020-11-26 LAB — CBC
HCT: 34.2 % — ABNORMAL LOW (ref 36.0–46.0)
Hemoglobin: 10.8 g/dL — ABNORMAL LOW (ref 12.0–15.0)
MCH: 29.4 pg (ref 26.0–34.0)
MCHC: 31.6 g/dL (ref 30.0–36.0)
MCV: 93.2 fL (ref 80.0–100.0)
Platelets: 232 10*3/uL (ref 150–400)
RBC: 3.67 MIL/uL — ABNORMAL LOW (ref 3.87–5.11)
RDW: 12.7 % (ref 11.5–15.5)
WBC: 10.7 10*3/uL — ABNORMAL HIGH (ref 4.0–10.5)
nRBC: 0 % (ref 0.0–0.2)

## 2020-11-26 NOTE — Plan of Care (Signed)

## 2020-11-26 NOTE — Plan of Care (Signed)
  Problem: Pain Management: Goal: Pain level will decrease with appropriate interventions Outcome: Progressing   

## 2020-11-26 NOTE — Op Note (Signed)
NAME: Taylor Mccall, Taylor A. MEDICAL RECORD NO: [id number] ACCOUNT NO: [id number] DATE OF BIRTH: 26-Jul-1945 FACILITY: [facility]: [id number] PHYSICIAN: Lind Guest. 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  PROCEDURE:  Revision left total knee arthroplasty with revision of both femoral and tibial components.  IMPLANTS:  DePuy Attune CRS 6 left cemented femur with a 16 x 80 cemented stem, a size 16 mm CRS RP polyethylene insert, size 5 RP DePuy Attune revision tibial tray with 5 mm tibial augments medial and lateral and a 14 x 50 cemented stem.  SURGEON:  Lind Guest. Ninfa Linden, MD  ASSISTANT:  Erskine Emery, PA-C  ANESTHESIA: 1.  Left lower extremity adductor canal block. 2.  Spinal.  ANTIBIOTICS: 900 mg IV clindamycin.  TOURNIQUET TIME:  104 minutes.  BLOOD LOSS:  409 mL  COMPLICATIONS:  None.  INDICATIONS:  The patient is a 75 year old female who had a left primary total knee arthroplasty performed in 2012.  Over time that knee has failed.  It has become unstable for her and she does have pain in the knee.  There has been no trauma and no  evidence of infection.  X-ray showed subsidence of the tibial tray with varus malalignment at this point.  Even on exam, her knee hyperextends and is unstable.  I told her in length and detail about the recommendation of revision arthroplasty, revising  all components to give her more stable knee and hopefully improve her mobility and her quality of life.  She does agree to proceed with this given the instability of her knee and the signs and symptoms combined with the radiographic findings.  We talked  in length and detail about the risk of acute blood loss anemia, nerve or vessel injury, fracture, infection, DVT, and implant failure.  We talked about  our goals again being decrease pain, improve mobility and overall improve quality of life with a  stable knee.  DESCRIPTION OF PROCEDURE:  After informed consent was obtained, appropriate left knee was marked and adductor canal block was obtained in the holding room.  She was then brought to the operating room and sat up on the operating table.  Spinal anesthesia  was obtained. A Foley catheter was placed and a nonsterile tourniquet was placed around the upper left thigh.  Her left thigh, knee, leg, ankle and foot were prepped and draped with DuraPrep and sterile drapes including a sterile stockinette.  A timeout  was called and she was identified as correct patient, correct left knee.  We did use an Esmarch to wrap her leg and tourniquet was inflated to 300 mm of pressure.  We then excised her old scar from her previous knee incision over the patella and carried  this proximally and distally.  We dissected the knee joint, carried out a medial parapatellar arthrotomy.  We found an effusion and significant metalosis in the knee from poly liner wear and metalosis.  We performed a synovectomy of all three  compartments.  With the knee in a flexed position, we removed the polyethylene liner.  It was significantly worn.  We then removed the tibial tray easily and it was definitely loose.  We had to chisel away to remove the femoral component, but it was able  to easily move.  We did not lose any significant bone stock.  We then removed all  cement debris around the tibia and the femur.  We then made a freshening tibial cut using the DePuy Attune revision knee system.  Once we had this tibia cut, we then  prepared our proximal tibia for reaming with a size 5 tibial tray we used and then a 14 x 50 stem.  We did place 5 mm augments to raise the joint line back up and this was medial and lateral on the tibial tray.  We then prepared the femur and reamed the  distal femur shaft up to a 16 mm stem.  We then made a  freshening distal femoral cut.  We chose a size 6 femur based on our assessment and measurements.  We put a 4-in-1 cutting block for a size 6 femur, made our anterior and posterior cuts, followed by  our chamfer cuts.  We then made our femoral box cut.  We then trialled our size 5 RP tibial tray with a 14 x 50 stem and 5 mm wedge augments.  We trialled our 6 femur for left knee with a 16 x 80 stem with no augments.  Based off of that, we placed a 16  mm RP polyethylene insert.  This improved the stability of her knee.  She did not hyperextend like she did prior to the surgery and the knee was stable to varus and valgus stressing with good range of motion.  We did move our previous patellar button in  terms of the poly liner patella, but the metal backed patella had nice and secure finding, so we changed out that poly button.  We then removed all trial instrumentation from the knee and irrigated the knee with normal saline solution using pulsatile  lavage.  We dried the knee real well and then mixed our cement.  We then cemented our size 5 RP revision tray with a 14 x 50 mm cemented stem and 5 mm medial and lateral augments.  We then cemented our size 6 left femoral component with a 16 x 80 stem.   Once we got the cement in place with both the femoral and tibial components, we placed our real 16 mm fixed bearing polyethylene insert.  We then held the knee fully extended and compressed until the cement hardened.  Once it hardened, we put the knee  through range of motion.  We were pleased with range of motion and stability.  We then let the tourniquet down and hemostasis was obtained with electrocautery.  We closed the arthrotomy with interrupted #1 Vicryl suture followed by 0 Vicryl for the deep  tissue and 2-0 Vicryl to close the subcutaneous tissue.  The skin was closed with staples.  Well-padded sterile dressing was applied.  She was taken to recovery room in stable condition with all final counts being  correct and no complications noted.  Of  note, Erskine Emery, PA-C did assist in entire case and assistance was crucial for facilitating all aspects of this case.   VAI D: 11/25/2020 2:24:34 pm T: 11/26/2020 12:30:00 am  JOB: [id number]/ [id number]

## 2020-11-26 NOTE — Evaluation (Signed)
Physical Therapy Evaluation Patient Details Name: Taylor Mccall MRN: [id number] DOB: 06/13/1945 Today's Date: 11/26/2020  History of Present Illness  75 yo female s/p L TK rev 11/25/20. Hx of L THA rev 03/2020, R TKA 2015, L TKA 2012, RA, lymphedema  Clinical Impression  On eval, pt required Min A +2 for safety/equipment. Pt walked ~12 feet with a RW. Ambulation distance limited by nausea and some dizziness. Moderate pain with activity. Will plan to follow and progress activity as tolerated.        Recommendations for follow up therapy are one component of a multi-disciplinary discharge planning process, led by the attending physician.  Recommendations may be updated based on patient status, additional functional criteria and insurance authorization.  Follow Up Recommendations Follow surgeon's recommendation for DC plan and follow-up therapies    Equipment Recommendations  None recommended by PT    Recommendations for Other Services       Precautions / Restrictions Precautions Precautions: Fall;Knee Required Braces or Orthoses: Knee Immobilizer - Left Knee Immobilizer - Left: Discontinue once straight leg raise with < 10 degree lag Restrictions Weight Bearing Restrictions: No LLE Weight Bearing: Weight bearing as tolerated      Mobility  Bed Mobility Overal bed mobility: Needs Assistance Bed Mobility: Supine to Sit     Supine to sit: Min assist;HOB elevated     General bed mobility comments: Assist for L LE. Increased time    Transfers Overall transfer level: Needs assistance Equipment used: Rolling walker (2 wheeled) Transfers: Sit to/from [facility] Sit to Stand: Min assist;From elevated surface Stand pivot transfers: Min assist       General transfer comment: Assist to power up, steady, control descent. Cues for safety, technique, hand/LE placement. Increased time.  Ambulation/Gait Ambulation/Gait assistance: Min assist;+2  safety/equipment Gait Distance (Feet): 12 Feet Assistive device: Rolling walker (2 wheeled) Gait Pattern/deviations: Step-to pattern;Trunk flexed     General Gait Details: Cues for safety, technique, sequence. Assist to stabilize pt throughout distance. Ambulation distance limited by nausea and some dizziness.  Stairs            Wheelchair Mobility    Modified Rankin (Stroke Patients Only)       Balance Overall balance assessment: Needs assistance         Standing balance support: Bilateral upper extremity supported Standing balance-Leahy Scale: Fair                               Pertinent Vitals/Pain Pain Assessment: 0-10 Pain Score: 8  Pain Location: L knee Pain Descriptors / Indicators: Discomfort;Sore;Aching Pain Intervention(s): Limited activity within patient's tolerance;Monitored during session;Repositioned    Home Living Family/patient expects to be discharged to:: Private residence Living Arrangements: Children Available Help at Discharge: Family Type of Home: House Home Access: Stairs to enter   Technical brewer of Steps: 2-back; 4-front with 2 rails but far apart Home Layout: Two level;1/2 bath on main level;Bed/bath upstairs Home Equipment: Walker - 4 wheels;Cane - single point;Shower seat;Bedside commode;Walker - 2 wheels Additional Comments: pt lives with her daughter who works at night. Pt has half bath on main level but no bedroom to sleep.    Prior Function Level of Independence: Independent with assistive device(s)         Comments: using RW     Hand Dominance   Dominant Hand: Right    Extremity/Trunk Assessment   Upper Extremity Assessment Upper Extremity Assessment:  Overall WFL for tasks assessed    Lower Extremity Assessment Lower Extremity Assessment: Generalized weakness (L LE swelling-pt reports hx of lymphedema)    Cervical / Trunk Assessment Cervical / Trunk Assessment: Normal  Communication    Communication: No difficulties  Cognition Arousal/Alertness: Awake/alert Behavior During Therapy: WFL for tasks assessed/performed Overall Cognitive Status: Within Functional Limits for tasks assessed                                        General Comments      Exercises     Assessment/Plan    PT Assessment Patient needs continued PT services  PT Problem List Decreased strength;Decreased mobility;Decreased range of motion;Decreased activity tolerance;Decreased knowledge of use of DME;Pain;Decreased balance       PT Treatment Interventions DME instruction;Therapeutic activities;Therapeutic exercise;Gait training;Patient/family education;Functional mobility training;Stair training;Balance training    PT Goals (Current goals can be found in the Care Plan section)  Acute Rehab PT Goals Patient Stated Goal: less pain. PT Goal Formulation: With patient Time For Goal Achievement: 12/10/20 Potential to Achieve Goals: Good    Frequency 7X/week   Barriers to discharge        Co-evaluation               AM-PAC PT "6 Clicks" Mobility  Outcome Measure Help needed turning from your back to your side while in a flat bed without using bedrails?: A Little Help needed moving from lying on your back to sitting on the side of a flat bed without using bedrails?: A Little Help needed moving to and from a bed to a chair (including a wheelchair)?: A Little Help needed standing up from a chair using your arms (e.g., wheelchair or bedside chair)?: A Little Help needed to walk in hospital room?: A Lot Help needed climbing 3-5 steps with a railing? : A Lot 6 Click Score: 16    End of Session Equipment Utilized During Treatment: Gait belt;Left knee immobilizer Activity Tolerance: Patient limited by pain (limited by nausea, dizziness) Patient left: in chair;with call bell/phone within reach   PT Visit Diagnosis: Other abnormalities of gait and mobility (R26.89);Pain Pain  - Right/Left: Left Pain - part of body: Knee    Time: 1156-1229 PT Time Calculation (min) (ACUTE ONLY): 33 min   Charges:   PT Evaluation $PT Eval Low Complexity: 1 Low PT Treatments $Gait Training: 8-22 mins          Doreatha Massed, PT Acute Rehabilitation  Office: [phone] Pager: [phone]

## 2020-11-26 NOTE — Progress Notes (Signed)
Subjective: 1 Day Post-Op Procedure(s) (LRB): LEFT TOTAL KNEE REVISION (Left) Patient reports pain as moderate.    Objective: Vital signs in last 24 hours: Temp:  [97.1 F (36.2 C)-98 F (36.7 C)] 98 F (36.7 C) (10/22 0849) Pulse Rate:  [58-83] 70 (10/22 0849) Resp:  [8-23] 16 (10/22 0849) BP: (108-137)/(58-79) 119/74 (10/22 0849) SpO2:  [97 %-100 %] 98 % (10/22 0849)  Intake/Output from previous day: 10/21 0701 - 10/22 0700 In: 3504.1 [P.O.:460; I.V.:2794.1; IV Piggyback:250] Out: 1275 [Urine:1175; Blood:100] Intake/Output this shift: Total I/O In: 301.3 [I.V.:301.3] Out: -   Recent Labs    11/26/20 0313  HGB 10.8*   Recent Labs    11/26/20 0313  WBC 10.7*  RBC 3.67*  HCT 34.2*  PLT 232   Recent Labs    11/26/20 0313  NA 138  K 4.0  CL 109  CO2 23  BUN 16  CREATININE 0.59  GLUCOSE 148*  CALCIUM 8.6*   No results for input(s): LABPT, INR in the last 72 hours.  Sensation intact distally Intact pulses distally Dorsiflexion/Plantar flexion intact Incision: scant drainage No cellulitis present Compartment soft   Assessment/Plan: 1 Day Post-Op Procedure(s) (LRB): LEFT TOTAL KNEE REVISION (Left) Up with therapy      Taylor Mccall 11/26/2020, 10:41 AM

## 2020-11-26 NOTE — TOC Transition Note (Signed)
Transition of Care [facility]) - CM/SW Discharge Note   Patient Details  Name: Taylor Mccall MRN: [id number] Date of Birth: Jul 16, 1945  [facility] of [facility]) CM/SW Contact:  Lennart Pall, LCSW Phone Number: 11/26/2020, 10:01 AM   Clinical Narrative:    Met with pt and confirming she has all needed DME at home.  Plan for HHPT via [place].  No further TOC needs.   Final next level of care: Home w Home Health Services Barriers to Discharge: No Barriers Identified   Patient Goals and CMS Choice Patient states their goals for this hospitalization and ongoing recovery are:: return home      Discharge Placement                       Discharge Plan and Services                DME Arranged: N/A DME Agency: NA       HH Arranged: PT HH Agency: [facility] Date [place]:  (prearranged via MD office)      Social Determinants of Health (SDOH) Interventions     Readmission Risk Interventions No flowsheet data found.

## 2020-11-26 NOTE — Progress Notes (Signed)
Physical Therapy Treatment Patient Details Name: Taylor Mccall MRN: [id number] DOB: 09-Oct-1945 Today's Date: 11/26/2020   History of Present Illness 75 yo female s/p L TK rev 11/25/20. Hx of L THA rev 03/2020, R TKA 2015, L TKA 2012, RA, lymphedema    PT Comments    Progressing slowly with mobility. +2 for safety this session. Pt walked ~60 feet with a RW. Moderate pain with activity. Will continue to progress activity as safely able.    Recommendations for follow up therapy are one component of a multi-disciplinary discharge planning process, led by the attending physician.  Recommendations may be updated based on patient status, additional functional criteria and insurance authorization.  Follow Up Recommendations  Follow surgeon's recommendation for DC plan and follow-up therapies     Equipment Recommendations  None recommended by PT    Recommendations for Other Services       Precautions / Restrictions Precautions Precautions: Fall;Knee Required Braces or Orthoses: Knee Immobilizer - Left Knee Immobilizer - Left: Discontinue once straight leg raise with < 10 degree lag Restrictions Weight Bearing Restrictions: No LLE Weight Bearing: Weight bearing as tolerated     Mobility  Bed Mobility Overal bed mobility: Needs Assistance Bed Mobility: Sit to Supine     Supine to sit: Min assist;HOB elevated Sit to supine: Min assist   General bed mobility comments: Assist for L LE. Increased time    Transfers Overall transfer level: Needs assistance Equipment used: Rolling walker (2 wheeled) Transfers: Sit to/from Stand Sit to Stand: Min assist;+2 physical assistance;+2 safety/equipment Stand pivot transfers: Min assist       General transfer comment: +2 Assist to power up from low recliner, steady, control descent. Cues for safety, technique, hand/LE placement. Increased time.  Ambulation/Gait Ambulation/Gait assistance: Min assist;+2 safety/equipment Gait  Distance (Feet): 60 Feet Assistive device: Rolling walker (2 wheeled) Gait Pattern/deviations: Step-through pattern;Decreased stride length;Trunk flexed     General Gait Details: Cues for safety, technique, sequence. Assist to stabilize pt throughout distance. Followed with recliner but did not need to use it this session   Stairs             Wheelchair Mobility    Modified Rankin (Stroke Patients Only)       Balance Overall balance assessment: Needs assistance         Standing balance support: Bilateral upper extremity supported Standing balance-Leahy Scale: Poor                              Cognition Arousal/Alertness: Awake/alert Behavior During Therapy: WFL for tasks assessed/performed Overall Cognitive Status: Within Functional Limits for tasks assessed                                        Exercises      General Comments        Pertinent Vitals/Pain Pain Assessment: 0-10 Pain Score: 7  Pain Location: L knee Pain Descriptors / Indicators: Discomfort;Sore;Aching Pain Intervention(s): Monitored during session;Ice applied;Repositioned    Home Living Family/patient expects to be discharged to:: Private residence Living Arrangements: Children Available Help at Discharge: Family Type of Home: House Home Access: Stairs to enter   [place]: Two level;1/2 bath on main level;Bed/bath upstairs Home Equipment: [place] - 4 wheels;Cane - single point;Shower seat;Bedside commode;Walker - 2 wheels Additional Comments: pt lives with her daughter  who works at night. Pt has half bath on main level but no bedroom to sleep.    Prior Function Level of Independence: Independent with assistive device(s)      Comments: using RW   PT Goals (current goals can now be found in the care plan section) Acute Rehab PT Goals Patient Stated Goal: less pain. PT Goal Formulation: With patient Time For Goal Achievement: 12/10/20 Potential to  Achieve Goals: Good Progress towards PT goals: Progressing toward goals    Frequency    7X/week      PT Plan Current plan remains appropriate    Co-evaluation              AM-PAC PT "6 Clicks" Mobility   Outcome Measure  Help needed turning from your back to your side while in a flat bed without using bedrails?: A Little Help needed moving from lying on your back to sitting on the side of a flat bed without using bedrails?: A Little Help needed moving to and from a bed to a chair (including a wheelchair)?: A Little Help needed standing up from a chair using your arms (e.g., wheelchair or bedside chair)?: A Little Help needed to walk in hospital room?: A Lot Help needed climbing 3-5 steps with a railing? : A Lot 6 Click Score: 16    End of Session Equipment Utilized During Treatment: Gait belt;Left knee immobilizer Activity Tolerance: Patient limited by fatigue Patient left: in bed;with call bell/phone within reach;with family/visitor present;with bed alarm set   PT Visit Diagnosis: Other abnormalities of gait and mobility (R26.89);Pain Pain - Right/Left: Left Pain - part of body: Knee     Time: 1407-1430 PT Time Calculation (min) (ACUTE ONLY): 23 min  Charges:  $Gait Training: 23-37 mins                         Doreatha Massed, PT Acute Rehabilitation  Office: [phone] Pager: [phone]

## 2020-11-27 NOTE — Progress Notes (Signed)
Physical Therapy Treatment Patient Details Name: Taylor Mccall MRN: [id number] DOB: 16-Feb-1945 Today's Date: 11/27/2020   History of Present Illness 75 yo female s/p L TK rev 11/25/20. Hx of L THA rev 03/2020, R TKA 2015, L TKA 2012, RA, lymphedema    PT Comments    Progressing slowly. Pain controlled. Will continue to progress activity as tolerated.    Recommendations for follow up therapy are one component of a multi-disciplinary discharge planning process, led by the attending physician.  Recommendations may be updated based on patient status, additional functional criteria and insurance authorization.  Follow Up Recommendations  Follow surgeon's recommendation for DC plan and follow-up therapies;Supervision/Assistance - 24 hour     Equipment Recommendations  None recommended by PT    Recommendations for Other Services       Precautions / Restrictions Precautions Precautions: Fall;Knee Required Braces or Orthoses: Knee Immobilizer - Left Knee Immobilizer - Left: Discontinue once straight leg raise with < 10 degree lag Restrictions Weight Bearing Restrictions: No LLE Weight Bearing: Weight bearing as tolerated     Mobility  Bed Mobility Overal bed mobility: Needs Assistance Bed Mobility: Supine to Sit;Sit to Supine     Supine to sit: Min assist Sit to supine: Min assist   General bed mobility comments: Assist for L LE. Increased time    Transfers Overall transfer level: Needs assistance Equipment used: Rolling walker (2 wheeled) Transfers: Sit to/from Stand Sit to Stand: Min assist;+2 physical assistance;+2 safety/equipment;From elevated surface Stand pivot transfers: Min assist       General transfer comment: x2. +2 Assist to power up from low recliner/BSC, steady, control descent. Cues for safety, technique, hand/LE placement. Increased time.  Ambulation/Gait Ambulation/Gait assistance: Min assist Gait Distance (Feet): 65 Feet Assistive device:  Rolling walker (2 wheeled) Gait Pattern/deviations: Step-through pattern;Decreased stride length;Trunk flexed     General Gait Details: Cues for safety, technique, sequence. Assist to stabilize pt throughout distance. Slow gait speed. Pt denied dizziness.   Stairs             Wheelchair Mobility    Modified Rankin (Stroke Patients Only)       Balance Overall balance assessment: Needs assistance         Standing balance support: Bilateral upper extremity supported Standing balance-Leahy Scale: Poor                              Cognition Arousal/Alertness: Awake/alert Behavior During Therapy: WFL for tasks assessed/performed Overall Cognitive Status: Within Functional Limits for tasks assessed                                        Exercises Total Joint Exercises Ankle Circles/Pumps: AROM;Both;10 reps Quad Sets: AROM;Both;10 reps Heel Slides: AAROM;Left;10 reps Hip ABduction/ADduction: AAROM;Left;10 reps Straight Leg Raises: AAROM;Left;10 reps Goniometric ROM: ~10-65 degrees    General Comments        Pertinent Vitals/Pain Pain Assessment: 0-10 Pain Score: 7  Pain Location: L knee Pain Descriptors / Indicators: Discomfort;Sore;Aching Pain Intervention(s): Limited activity within patient's tolerance;Monitored during session;Ice applied;Repositioned    Home Living                      Prior Function            PT Goals (current goals can now be found in the  care plan section) Progress towards PT goals: Progressing toward goals    Frequency    7X/week      PT Plan Current plan remains appropriate    Co-evaluation              AM-PAC PT "6 Clicks" Mobility   Outcome Measure  Help needed turning from your back to your side while in a flat bed without using bedrails?: A Little Help needed moving from lying on your back to sitting on the side of a flat bed without using bedrails?: A Little Help  needed moving to and from a bed to a chair (including a wheelchair)?: A Little Help needed standing up from a chair using your arms (e.g., wheelchair or bedside chair)?: A Lot Help needed to walk in hospital room?: A Little Help needed climbing 3-5 steps with a railing? : A Lot 6 Click Score: 16    End of Session Equipment Utilized During Treatment: Gait belt;Left knee immobilizer Activity Tolerance: Patient tolerated treatment well Patient left: in bed;with call bell/phone within reach;with bed alarm set   PT Visit Diagnosis: Other abnormalities of gait and mobility (R26.89);Pain Pain - Right/Left: Left Pain - part of body: Knee     Time: 1027-1101 PT Time Calculation (min) (ACUTE ONLY): 34 min  Charges:  $Gait Training: 8-22 mins $Therapeutic Exercise: 8-22 mins                         Doreatha Massed, PT Acute Rehabilitation  Office: [phone] Pager: [phone]

## 2020-11-27 NOTE — Progress Notes (Signed)
Physical Therapy Treatment Patient Details Name: Taylor Mccall MRN: [id number] DOB: Mar 30, 1945 Today's Date: 11/27/2020   History of Present Illness 75 yo female s/p L TK rev 11/25/20. Hx of L THA rev 03/2020, R TKA 2015, L TKA 2012, RA, lymphedema    PT Comments    Progressing slowly. Has the most difficulty with sit to stand (pt has a lift chair at home). She walked ~65 feet again this afternoon. Pain is controlled. Will continue to follow.    Recommendations for follow up therapy are one component of a multi-disciplinary discharge planning process, led by the attending physician.  Recommendations may be updated based on patient status, additional functional criteria and insurance authorization.  Follow Up Recommendations  Follow surgeon's recommendation for DC plan and follow-up therapies;Supervision/Assistance - 24 hour     Equipment Recommendations  None recommended by PT    Recommendations for Other Services       Precautions / Restrictions Precautions Precautions: Fall;Knee Required Braces or Orthoses: Knee Immobilizer - Left Knee Immobilizer - Left: Discontinue once straight leg raise with < 10 degree lag Restrictions Weight Bearing Restrictions: No LLE Weight Bearing: Weight bearing as tolerated     Mobility  Bed Mobility Overal bed mobility: Needs Assistance Bed Mobility: Supine to Sit;Sit to Supine     Supine to sit: Min assist;HOB elevated Sit to supine: Min assist;HOB elevated   General bed mobility comments: Assist for L LE. Increased time    Transfers Overall transfer level: Needs assistance Equipment used: Rolling walker (2 wheeled) Transfers: Sit to/from Stand Sit to Stand: Mod assist;From elevated surface         General transfer comment: Assist to power up, stabilize, control descent. Cues for safety, technique, hand/LE placement. Increased time.  Ambulation/Gait Ambulation/Gait assistance: Min guard Gait Distance (Feet): 65  Feet Assistive device: Rolling walker (2 wheeled) Gait Pattern/deviations: Step-through pattern;Decreased stride length;Trunk flexed     General Gait Details: Cues for safety, technique, sequence. Assist to stabilize pt throughout distance. Slow gait speed. Pt denied dizziness.   Stairs             Wheelchair Mobility    Modified Rankin (Stroke Patients Only)       Balance Overall balance assessment: Needs assistance         Standing balance support: Bilateral upper extremity supported Standing balance-Leahy Scale: Poor                              Cognition Arousal/Alertness: Awake/alert Behavior During Therapy: WFL for tasks assessed/performed Overall Cognitive Status: Within Functional Limits for tasks assessed                                        Exercises      General Comments        Pertinent Vitals/Pain Pain Assessment: 0-10 Pain Score: 6  Pain Location: L knee Pain Descriptors / Indicators: Discomfort;Sore;Aching Pain Intervention(s): Limited activity within patient's tolerance;Monitored during session;Ice applied    Home Living                      Prior Function            PT Goals (current goals can now be found in the care plan section) Progress towards PT goals: Progressing toward goals    Frequency  7X/week      PT Plan Current plan remains appropriate    Co-evaluation              AM-PAC PT "6 Clicks" Mobility   Outcome Measure  Help needed turning from your back to your side while in a flat bed without using bedrails?: A Little Help needed moving from lying on your back to sitting on the side of a flat bed without using bedrails?: A Little Help needed moving to and from a bed to a chair (including a wheelchair)?: A Little Help needed standing up from a chair using your arms (e.g., wheelchair or bedside chair)?: A Lot Help needed to walk in hospital room?: A Little Help  needed climbing 3-5 steps with a railing? : A Lot 6 Click Score: 16    End of Session Equipment Utilized During Treatment: Gait belt;Left knee immobilizer Activity Tolerance: Patient tolerated treatment well Patient left: in bed;with call bell/phone within reach;with bed alarm set   PT Visit Diagnosis: Other abnormalities of gait and mobility (R26.89);Pain Pain - Right/Left: Left Pain - part of body: Knee     Time: 4496-7591 PT Time Calculation (min) (ACUTE ONLY): 34 min  Charges:  $Gait Training: 23-37 mins                         Doreatha Massed, PT Acute Rehabilitation  Office: [phone] Pager: [phone]

## 2020-11-27 NOTE — Plan of Care (Signed)
  Problem: Pain Management: Goal: Pain level will decrease with appropriate interventions Outcome: Progressing   

## 2020-11-27 NOTE — Progress Notes (Signed)
Subjective: 2 Days Post-Op Procedure(s) (LRB): LEFT TOTAL KNEE REVISION (Left) Patient reports pain as moderate.  Slow progress with therapy yesterday, but expected.  Vitals stable.  Objective: Vital signs in last 24 hours: Temp:  [98 F (36.7 C)-99.7 F (37.6 C)] 99.7 F (37.6 C) (10/23 0612) Pulse Rate:  [70-98] 98 (10/23 0612) Resp:  [16-18] 16 (10/23 0612) BP: (119-144)/(68-74) 142/72 (10/23 0612) SpO2:  [96 %-99 %] 96 % (10/23 0612)  Intake/Output from previous day: 10/22 0701 - 10/23 0700 In: 1359.6 [P.O.:360; I.V.:999.6] Out: 900 [Urine:900] Intake/Output this shift: No intake/output data recorded.  Recent Labs    11/26/20 0313  HGB 10.8*   Recent Labs    11/26/20 0313  WBC 10.7*  RBC 3.67*  HCT 34.2*  PLT 232   Recent Labs    11/26/20 0313  NA 138  K 4.0  CL 109  CO2 23  BUN 16  CREATININE 0.59  GLUCOSE 148*  CALCIUM 8.6*   No results for input(s): LABPT, INR in the last 72 hours.  Sensation intact distally Intact pulses distally Dorsiflexion/Plantar flexion intact Incision: scant drainage Compartment soft   Assessment/Plan: 2 Days Post-Op Procedure(s) (LRB): LEFT TOTAL KNEE REVISION (Left) Up with therapy Will continue inpatient PT for the next day or two with the hopes of having her discharge to home with HHPT and family support early this week.     Mcarthur Rossetti 11/27/2020, 8:36 AM

## 2020-11-27 NOTE — Plan of Care (Signed)
  Problem: Education: Goal: Knowledge of the prescribed therapeutic regimen will improve Outcome: Progressing   Problem: Activity: Goal: Ability to avoid complications of mobility impairment will improve Outcome: Progressing   Problem: Pain Management: Goal: Pain level will decrease with appropriate interventions Outcome: Progressing   

## 2020-11-28 NOTE — Anesthesia Postprocedure Evaluation (Signed)
Anesthesia Post Note  Patient: Taylor Mccall  Procedure(s) Performed: LEFT TOTAL KNEE REVISION (Left: Knee)     Patient location during evaluation: PACU Anesthesia Type: Spinal Level of consciousness: awake and alert and oriented Pain management: pain level controlled Vital Signs Assessment: post-procedure vital signs reviewed and stable Respiratory status: spontaneous breathing, nonlabored ventilation and respiratory function stable Cardiovascular status: blood pressure returned to baseline Postop Assessment: no apparent nausea or vomiting, spinal receding, no headache and no backache Anesthetic complications: no   No notable events documented.           Marthenia Rolling

## 2020-11-28 NOTE — Plan of Care (Signed)

## 2020-11-28 NOTE — Progress Notes (Signed)
Physical Therapy Treatment Patient Details Name: Taylor Mccall MRN: [id number] DOB: 1945-12-05 Today's Date: 11/28/2020   History of Present Illness 75 yo female s/p L TK rev 11/25/20. Hx of L THA rev 03/2020, R TKA 2015, L TKA 2012, RA, lymphedema    PT Comments    Progressing with mobility. Will attempt stair negotiation this afternoon. Hopeful for d/c home after PT session tomorrow morning. Pain rated 6/10.    Recommendations for follow up therapy are one component of a multi-disciplinary discharge planning process, led by the attending physician.  Recommendations may be updated based on patient status, additional functional criteria and insurance authorization.  Follow Up Recommendations  Follow physician's recommendations for discharge plan and follow up therapies     Assistance Recommended at Discharge Frequent or constant Supervision/Assistance  Equipment Recommendations  None recommended by PT    Recommendations for Other Services       Precautions / Restrictions Precautions Precautions: Fall;Knee Required Braces or Orthoses: Knee Immobilizer - Left Knee Immobilizer - Left: Discontinue once straight leg raise with < 10 degree lag Restrictions Weight Bearing Restrictions: No LLE Weight Bearing: Weight bearing as tolerated     Mobility  Bed Mobility Overal bed mobility: Needs Assistance Bed Mobility: Supine to Sit;Sit to Supine     Supine to sit: Min assist;HOB elevated Sit to supine: Min assist;HOB elevated   General bed mobility comments: Assist for L LE. Increased time    Transfers Overall transfer level: Needs assistance Equipment used: Rolling walker (2 wheels) Transfers: Sit to/from Stand Sit to Stand: Mod assist;From elevated surface           General transfer comment: Assist to power up, stabilize, control descent. Cues for safety, technique, hand/LE placement. Increased time. Requires highly elevated bed     Ambulation/Gait Ambulation/Gait assistance: Min guard Gait Distance (Feet): 80 Feet Assistive device: Rolling walker (2 wheels) Gait Pattern/deviations: Step-to pattern     General Gait Details: Cues for safety, technique, sequence. Assist to stabilize pt throughout distance. Slow gait speed. Pt denied dizziness.   Stairs             Wheelchair Mobility    Modified Rankin (Stroke Patients Only)       Balance Overall balance assessment: Needs assistance         Standing balance support: Bilateral upper extremity supported Standing balance-Leahy Scale: Poor                              Cognition Arousal/Alertness: Awake/alert Behavior During Therapy: WFL for tasks assessed/performed Overall Cognitive Status: Within Functional Limits for tasks assessed                                          Exercises Total Joint Exercises Ankle Circles/Pumps: AROM;Both;10 reps Quad Sets: AROM;Both;10 reps Heel Slides: AAROM;Left;10 reps Hip ABduction/ADduction: AAROM;Left;10 reps Straight Leg Raises: AAROM;Left;10 reps Goniometric ROM: ~10-65 degrees    General Comments        Pertinent Vitals/Pain Pain Assessment: 0-10 Pain Score: 6  Pain Location: L knee Pain Descriptors / Indicators: Discomfort;Sore;Aching Pain Intervention(s): Limited activity within patient's tolerance;Monitored during session;Ice applied    Home Living                          Prior Function  PT Goals (current goals can now be found in the care plan section) Progress towards PT goals: Progressing toward goals    Frequency    7X/week      PT Plan Current plan remains appropriate    Co-evaluation              AM-PAC PT "6 Clicks" Mobility   Outcome Measure  Help needed turning from your back to your side while in a flat bed without using bedrails?: A Little Help needed moving from lying on your back to sitting on the  side of a flat bed without using bedrails?: A Little Help needed moving to and from a bed to a chair (including a wheelchair)?: A Little Help needed standing up from a chair using your arms (e.g., wheelchair or bedside chair)?: A Lot Help needed to walk in hospital room?: A Little Help needed climbing 3-5 steps with a railing? : A Lot 6 Click Score: 16    End of Session Equipment Utilized During Treatment: Gait belt;Left knee immobilizer Activity Tolerance: Patient tolerated treatment well Patient left: in bed;with call bell/phone within reach;with bed alarm set   PT Visit Diagnosis: Other abnormalities of gait and mobility (R26.89);Pain Pain - Right/Left: Left Pain - part of body: Knee     Time: 4627-0350 PT Time Calculation (min) (ACUTE ONLY): 38 min  Charges:  $Gait Training: 23-37 mins $Therapeutic Exercise: 8-22 mins                         Doreatha Massed, PT Acute Rehabilitation  Office: [phone] Pager: [phone]

## 2020-11-28 NOTE — Progress Notes (Signed)
Physical Therapy Treatment Patient Details Name: Taylor Mccall MRN: [id number] DOB: September 24, 1945 Today's Date: 11/28/2020   History of Present Illness 75 yo female s/p L TK rev 11/25/20. Hx of L THA rev 03/2020, R TKA 2015, L TKA 2012, RA, lymphedema    PT Comments    Progressing with mobility. Practiced stair negotiation x 2 this afternoon. Will plan to practice again on tomorrow morning with son present to observe/assist. Plan is for d/c home on tomorrow if pt continues to do well.     Recommendations for follow up therapy are one component of a multi-disciplinary discharge planning process, led by the attending physician.  Recommendations may be updated based on patient status, additional functional criteria and insurance authorization.  Follow Up Recommendations  Follow physician's recommendations for discharge plan and follow up therapies     Assistance Recommended at Discharge Frequent or constant Supervision/Assistance  Equipment Recommendations  None recommended by PT    Recommendations for Other Services       Precautions / Restrictions Precautions Precautions: Fall;Knee Required Braces or Orthoses: Knee Immobilizer - Left Knee Immobilizer - Left: Discontinue once straight leg raise with < 10 degree lag Restrictions Weight Bearing Restrictions: No LLE Weight Bearing: Weight bearing as tolerated     Mobility  Bed Mobility Overal bed mobility: Needs Assistance Bed Mobility: Supine to Sit;Sit to Supine     Supine to sit: Min assist;HOB elevated Sit to supine: Min assist;HOB elevated   General bed mobility comments: Pt used gait belt as leg lifter-still required some assist for L LE on/off bed. Increased time. Cues provided    Transfers Overall transfer level: Needs assistance Equipment used: Rolling walker (2 wheels) Transfers: Sit to/from Stand Sit to Stand: Mod assist;From elevated surface           General transfer comment: Assist to power up,  stabilize, control descent. Cues for safety, technique, hand/LE placement. Increased time. Requires highly elevated bed    Ambulation/Gait Ambulation/Gait assistance: Min guard Gait Distance (Feet): 50 Feet Assistive device: Rolling walker (2 wheels) Gait Pattern/deviations: Step-to pattern     General Gait Details: Min guard for safety. Slow gait speed. Pt denied dizziness.   Stairs Stairs: Yes Stairs assistance: Min assist;+2 safety/equipment Stair Management: Step to pattern;Two rails;One rail Left Number of Stairs: 5 General stair comments: up and over portable stairs x 2-once with 2 rails, once with 2 hands on 1 rail. Cues for safety, technique, sequence. Son reports pt's rail is on R side at home so will likely need to practice with 1 crutch, 1 rail. Plan is to practice with son present on tomorrow morning   Wheelchair Mobility    Modified Rankin (Stroke Patients Only)       Balance Overall balance assessment: Needs assistance         Standing balance support: Bilateral upper extremity supported Standing balance-Leahy Scale: Poor                              Cognition Arousal/Alertness: Awake/alert Behavior During Therapy: WFL for tasks assessed/performed Overall Cognitive Status: Within Functional Limits for tasks assessed                                          Exercises Total Joint Exercises Ankle Circles/Pumps: AROM;Both;10 reps Quad Sets: AROM;Both;10 reps Heel Slides: AAROM;Left;10 reps  Hip ABduction/ADduction: AAROM;Left;10 reps Straight Leg Raises: AAROM;Left;10 reps Goniometric ROM: ~10-65 degrees    General Comments        Pertinent Vitals/Pain Pain Assessment: 0-10 Pain Score: 5  Pain Location: L knee Pain Descriptors / Indicators: Discomfort;Sore;Aching Pain Intervention(s): Limited activity within patient's tolerance;Monitored during session;Repositioned;Ice applied    Home Living                           Prior Function            PT Goals (current goals can now be found in the care plan section) Progress towards PT goals: Progressing toward goals    Frequency    7X/week      PT Plan Current plan remains appropriate    Co-evaluation              AM-PAC PT "6 Clicks" Mobility   Outcome Measure  Help needed turning from your back to your side while in a flat bed without using bedrails?: A Little Help needed moving from lying on your back to sitting on the side of a flat bed without using bedrails?: A Little Help needed moving to and from a bed to a chair (including a wheelchair)?: A Little Help needed standing up from a chair using your arms (e.g., wheelchair or bedside chair)?: A Lot Help needed to walk in hospital room?: A Little Help needed climbing 3-5 steps with a railing? : A Lot 6 Click Score: 16    End of Session Equipment Utilized During Treatment: Gait belt;Left knee immobilizer Activity Tolerance: Patient tolerated treatment well Patient left: in bed;with call bell/phone within reach;with bed alarm set   PT Visit Diagnosis: Other abnormalities of gait and mobility (R26.89);Pain Pain - Right/Left: Left Pain - part of body: Knee     Time: 9449-6759 PT Time Calculation (min) (ACUTE ONLY): 22 min  Charges:  $Gait Training: 8-22 mins $Therapeutic Exercise: 8-22 mins                        Doreatha Massed, PT Acute Rehabilitation  Office: [phone] Pager: [phone]

## 2020-11-28 NOTE — Progress Notes (Signed)
Subjective: 3 Days Post-Op Procedure(s) (LRB): LEFT TOTAL KNEE REVISION (Left) Patient reports pain as moderate.  Working slowly with therapy.  PT plans to try stairs later today.  Objective: Vital signs in last 24 hours: Temp:  [98.4 F (36.9 C)-99.6 F (37.6 C)] 98.4 F (36.9 C) (10/24 0917) Pulse Rate:  [95-104] 95 (10/24 0917) Resp:  [16] 16 (10/24 0917) BP: (135-140)/(62-63) 135/62 (10/24 0917) SpO2:  [93 %-98 %] 98 % (10/24 0917)  Intake/Output from previous day: 10/23 0701 - 10/24 0700 In: 600 [P.O.:600] Out: 1001 [Urine:1001] Intake/Output this shift: No intake/output data recorded.  Recent Labs    11/26/20 0313  HGB 10.8*   Recent Labs    11/26/20 0313  WBC 10.7*  RBC 3.67*  HCT 34.2*  PLT 232   Recent Labs    11/26/20 0313  NA 138  K 4.0  CL 109  CO2 23  BUN 16  CREATININE 0.59  GLUCOSE 148*  CALCIUM 8.6*   No results for input(s): LABPT, INR in the last 72 hours.  Sensation intact distally Intact pulses distally Dorsiflexion/Plantar flexion intact Incision: scant drainage No cellulitis present Compartment soft   Assessment/Plan: 3 Days Post-Op Procedure(s) (LRB): LEFT TOTAL KNEE REVISION (Left) Up with therapy Plan for discharge tomorrow Discharge home with home health      Mcarthur Rossetti 11/28/2020, 11:44 AM

## 2020-11-29 LAB — TYPE AND SCREEN
ABO/RH(D): B POS
Antibody Screen: POSITIVE
Unit division: 0

## 2020-11-29 LAB — BPAM RBC
Blood Product Expiration Date: 202211222359
Unit Type and Rh: 7300

## 2020-11-29 MED ORDER — ASPIRIN EC 81 MG PO TBEC: 81.0000 mg | DELAYED_RELEASE_TABLET | Freq: Two times a day (BID) | ORAL | 11 refills | Status: AC

## 2020-11-29 MED ORDER — METHOCARBAMOL 500 MG PO TABS: 500.0000 mg | ORAL_TABLET | Freq: Four times a day (QID) | ORAL | 1 refills | Status: DC | PRN

## 2020-11-29 MED ORDER — OXYCODONE HCL 5 MG PO TABS: 5.0000 mg | ORAL_TABLET | ORAL | 0 refills | Status: DC | PRN

## 2020-11-29 NOTE — Progress Notes (Signed)
Patient ID: Taylor Mccall, female   DOB: [birth year], 75 y.o.   MRN: [id number] No acute changes.  Left knee stable.  Has been slowly progressing with PT and her mobility.  Can be discharged to home today.

## 2020-11-29 NOTE — Progress Notes (Signed)
Orthopedic Tech Progress Note Patient Details:  Taylor Mccall 12/26/1945 [id number]  Ortho Devices Type of Ortho Device: Crutches Ortho Device/Splint Interventions: Ordered     Dropped off crutches for therapy.  Vernona Rieger 11/29/2020, 12:06 PM

## 2020-11-29 NOTE — Discharge Summary (Signed)
Patient ID: Taylor Mccall MRN: [id number] DOB/AGE: 12-Aug-1945 75 y.o.  Admit date: 11/25/2020 Discharge date: 11/29/2020  Admission Diagnoses:  Principal Problem:   Failed total knee, left, subsequent encounter Active Problems:   Status post revision of total knee replacement, left   Discharge Diagnoses:  Same  Past Medical History:  Diagnosis Date   Anxiety    Blood transfusion without reported diagnosis    Breast mass, left    Cataract    Depression    Difficult intubation 04/01/2020   Known difficult airway from previous records   Gait disturbance    GERD (gastroesophageal reflux disease)    Hypertension    Lymphedema of both lower extremities    Seeing OT at [facility] to Left Lower leg   Obesity    Rheumatoid arthritis(714.0)    Sleep apnea    cpap   Urinary incontinence    Venous insufficiency     Surgeries: Procedure(s): LEFT TOTAL KNEE REVISION on 11/25/2020   Consultants:   Discharged Condition: Improved  Hospital Course: Taylor Mccall is an 75 y.o. female who was admitted 11/25/2020 for operative treatment ofFailed total knee, left, subsequent encounter. Patient has severe unremitting pain that affects sleep, daily activities, and work/hobbies. After pre-op clearance the patient was taken to the operating room on 11/25/2020 and underwent  Procedure(s): LEFT TOTAL KNEE REVISION.    Patient was given perioperative antibiotics:  Anti-infectives (From admission, onward)    Start     Dose/Rate Route Frequency Ordered Stop   11/25/20 1800  clindamycin (CLEOCIN) IVPB 600 mg        600 mg 100 mL/hr over 30 Minutes Intravenous Every 6 hours 11/25/20 1613 11/26/20 0019   11/25/20 0845  clindamycin (CLEOCIN) IVPB 900 mg        900 mg 100 mL/hr over 30 Minutes Intravenous On call to O.R. 11/25/20 4098 11/25/20 1220        Patient was given sequential compression devices, early ambulation, and chemoprophylaxis to  prevent DVT.  Patient benefited maximally from hospital stay and there were no complications.    Recent vital signs: Patient Vitals for the past 24 hrs:  BP Temp Temp src Pulse Resp SpO2  11/29/20 0507 (!) 120/57 97.9 F (36.6 C) Oral 85 17 97 %  11/28/20 2209 118/63 99.3 F (37.4 C) Oral 98 18 99 %  11/28/20 1351 130/64 99.1 F (37.3 C) -- 92 16 98 %  11/28/20 0917 135/62 98.4 F (36.9 C) Oral 95 16 98 %     Recent laboratory studies: No results for input(s): WBC, HGB, HCT, PLT, NA, K, CL, CO2, BUN, CREATININE, GLUCOSE, INR, CALCIUM in the last 72 hours.  Invalid input(s): PT, 2   Discharge Medications:   Allergies as of 11/29/2020       Reactions   Penicillins Rash        Medication List     TAKE these medications    Aleve 220 MG Caps Generic drug: Naproxen Sodium Take 220-440 mg by mouth 2 (two) times daily as needed (pain.).   aspirin EC 81 MG tablet Take 1 tablet (81 mg total) by mouth 2 (two) times daily. Swallow whole. What changed: when to take this   brimonidine 0.2 % ophthalmic solution Commonly known as: ALPHAGAN Place 1 drop into both eyes 2 (two) times daily.   carboxymethylcellulose 0.5 % Soln Commonly known as: REFRESH PLUS Place 2 drops into the right eye 4 (four) times daily as  needed (for itchy eyes).   cholecalciferol 25 MCG (1000 UNIT) tablet Commonly known as: VITAMIN D Take 1,000 Units by mouth daily.   Vitamin D 50 MCG (2000 UT) tablet Take 2,000 Units by mouth daily. Chew   Fish Oil 1200 MG Caps Take 2,400 mg by mouth daily.   furosemide 20 MG tablet Commonly known as: LASIX Take 1 tablet (20 mg total) by mouth 2 (two) times daily. Morning & Midday   HAIR SKIN & NAILS GUMMIES PO Take 2 tablets by mouth daily.   hydroxychloroquine 200 MG tablet Commonly known as: PLAQUENIL TAKE 1 TABLET BY MOUTH TWICE A DAY   Klor-Con M20 20 MEQ tablet Generic drug: potassium chloride SA TAKE 2 TABLETS (40 MEQ TOTAL) BY MOUTH EVERY  MORNING.   latanoprost 0.005 % ophthalmic solution Commonly known as: XALATAN Place 1 drop into both eyes at bedtime.   losartan 25 MG tablet Commonly known as: COZAAR TAKE 1 TABLET BY MOUTH EVERY DAY   methocarbamol 500 MG tablet Commonly known as: ROBAXIN Take 1 tablet (500 mg total) by mouth every 6 (six) hours as needed for muscle spasms.   multivitamin with minerals tablet Take 1 tablet by mouth daily.   PRESERVISION AREDS 2+MULTI VIT PO Take 1 capsule by mouth in the morning and at bedtime.   oxyCODONE 5 MG immediate release tablet Commonly known as: Oxy IR/ROXICODONE Take 1-2 tablets (5-10 mg total) by mouth every 4 (four) hours as needed for moderate pain (pain score 4-6).   pantoprazole 40 MG tablet Commonly known as: PROTONIX TAKE ONE TABLET BY MOUTH ONCE DAILY BEFORE BREAKFAST   rosuvastatin 5 MG tablet Commonly known as: Crestor Take 1 tablet (5 mg total) by mouth at bedtime.   sertraline 100 MG tablet Commonly known as: ZOLOFT TAKE 1 TABLET BY MOUTH EVERY DAY IN THE MORNING   vitamin B-12 500 MCG tablet Commonly known as: CYANOCOBALAMIN Take 1,000 mcg by mouth daily. Gummy   vitamin C 250 MG tablet Commonly known as: ASCORBIC ACID Take 500 mg by mouth daily. (Gummy)   vitamin E 180 MG (400 UNITS) capsule Take 400 Units by mouth daily.               Durable Medical Equipment  (From admission, onward)           Start     Ordered   11/25/20 1614  DME 3 n 1  Once        11/25/20 1613   11/25/20 1614  DME Walker rolling  Once       Question Answer Comment  Walker: With 5 Inch Wheels   Patient needs a walker to treat with the following condition Status post revision of total knee replacement, left      11/25/20 1613            Diagnostic Studies: DG Knee Left Port  Result Date: 11/25/2020 CLINICAL DATA:  Status post revision of total knee replacement. EXAM: PORTABLE LEFT KNEE - 1-2 VIEW COMPARISON:  Radiograph 09/21/2020  FINDINGS: Revision left knee arthroplasty in expected alignment. No periprosthetic lucency or fracture. Patellofemoral arthroplasty. Recent postsurgical change includes air and edema in the soft tissues and joint space. Anterior skin staples in place. IMPRESSION: Revision left knee arthroplasty without immediate postoperative complication. Electronically Signed   By: Taylor Mccall M.D.   On: 11/25/2020 16:00    Disposition: Discharge disposition: 01-Home or Self Care          Follow-up Information  [facility] Follow up.   Specialty: [place] Why: to provide home health physical therapy Contact information: [address] [place] [address] [phone]         Mcarthur Rossetti, MD Follow up in 2 week(s).   Specialty: Orthopedic Surgery Contact information: [address] [place] [address] [phone]                  Signed: Mcarthur Rossetti 11/29/2020, 7:40 AM

## 2020-11-29 NOTE — Plan of Care (Signed)
  Problem: Education: Goal: Knowledge of the prescribed therapeutic regimen will improve Outcome: Adequate for Discharge Goal: Individualized Educational Video(s) Outcome: Adequate for Discharge   Problem: Activity: Goal: Ability to avoid complications of mobility impairment will improve Outcome: Adequate for Discharge Goal: Range of joint motion will improve Outcome: Adequate for Discharge   Problem: Clinical Measurements: Goal: Postoperative complications will be avoided or minimized Outcome: Adequate for Discharge   Problem: Pain Management: Goal: Pain level will decrease with appropriate interventions Outcome: Adequate for Discharge   Problem: Skin Integrity: Goal: Will show signs of wound healing Outcome: Adequate for Discharge   Problem: Acute Rehab PT Goals(only PT should resolve) Goal: Pt Will Go Supine/Side To Sit Outcome: Adequate for Discharge Goal: Pt Will Go Sit To Supine/Side Outcome: Adequate for Discharge Goal: Patient Will Transfer Sit To/From Stand Outcome: Adequate for Discharge Goal: Pt Will Ambulate Outcome: Adequate for Discharge   Problem: Education: Goal: Knowledge of General Education information will improve Description: Including pain rating scale, medication(s)/side effects and non-pharmacologic comfort measures Outcome: Adequate for Discharge   Problem: Health Behavior/Discharge Planning: Goal: Ability to manage health-related needs will improve Outcome: Adequate for Discharge   Problem: Clinical Measurements: Goal: Ability to maintain clinical measurements within normal limits will improve Outcome: Adequate for Discharge Goal: Will remain free from infection Outcome: Adequate for Discharge Goal: Diagnostic test results will improve Outcome: Adequate for Discharge Goal: Respiratory complications will improve Outcome: Adequate for Discharge Goal: Cardiovascular complication will be avoided Outcome: Adequate for Discharge   Problem:  Activity: Goal: Risk for activity intolerance will decrease Outcome: Adequate for Discharge   Problem: Nutrition: Goal: Adequate nutrition will be maintained Outcome: Adequate for Discharge   Problem: Coping: Goal: Level of anxiety will decrease Outcome: Adequate for Discharge   Problem: Elimination: Goal: Will not experience complications related to bowel motility Outcome: Adequate for Discharge Goal: Will not experience complications related to urinary retention Outcome: Adequate for Discharge   Problem: Pain Managment: Goal: General experience of comfort will improve Outcome: Adequate for Discharge   Problem: Safety: Goal: Ability to remain free from injury will improve Outcome: Adequate for Discharge   Problem: Skin Integrity: Goal: Risk for impaired skin integrity will decrease Outcome: Adequate for Discharge

## 2020-11-29 NOTE — Progress Notes (Signed)
Physical Therapy Treatment Patient Details Name: Taylor Mccall MRN: [id number] DOB: 1946-01-03 Today's Date: 11/29/2020   History of Present Illness 75 yo female s/p L TK rev 11/25/20. Hx of L THA rev 03/2020, R TKA 2015, L TKA 2012, RA, lymphedema    PT Comments    Progressing with mobility. Reviewed exercises, gait training, and stair training. Instructed pt to wear KI for safety until HHPT comes out to assess/work with her. All education completed. Okay to d/c from PT standpoint.    Recommendations for follow up therapy are one component of a multi-disciplinary discharge planning process, led by the attending physician.  Recommendations may be updated based on patient status, additional functional criteria and insurance authorization.  Follow Up Recommendations  Follow physician's recommendations for discharge plan and follow up therapies     Assistance Recommended at Discharge    Equipment Recommendations  Crutches (called ortho tech to bring up a set)    Recommendations for Other Services       Precautions / Restrictions Precautions Precautions: Fall;Knee Required Braces or Orthoses: Knee Immobilizer - Left Knee Immobilizer - Left: Discontinue once straight leg raise with < 10 degree lag Restrictions Weight Bearing Restrictions: No LLE Weight Bearing: Weight bearing as tolerated     Mobility  Bed Mobility Overal bed mobility: Needs Assistance Bed Mobility: Supine to Sit;Sit to Supine     Supine to sit: Min assist;HOB elevated Sit to supine: Min assist;HOB elevated   General bed mobility comments: Pt used gait belt as leg lifter-still required some assist for L LE on/off bed. Increased time. Cues provided    Transfers Overall transfer level: Needs assistance Equipment used: Rolling walker (2 wheels) Transfers: Sit to/from Stand Sit to Stand: Mod assist;From elevated surface           General transfer comment: Assist to power up, stabilize, control  descent. Cues for safety, technique, hand/LE placement. Increased time. Requires highly elevated bed    Ambulation/Gait Ambulation/Gait assistance: Min guard Gait Distance (Feet): 50 Feet Assistive device: Rolling walker (2 wheels) Gait Pattern/deviations: Step-to pattern     General Gait Details: Min guard for safety. Slow gait speed.   Stairs Stairs: Yes Min Assist    Number of Stairs: 2 General stair comments: up and over stairs x 1 with 1 rail, 1 crutch. Cues for safety, technique, sequence. Son present to observe/assist.   Engineer, building services [place] (Stroke Patients Only)       Balance Overall balance assessment: Needs assistance         Standing balance support: Bilateral upper extremity supported Standing balance-Leahy Scale: Poor                              Cognition Arousal/Alertness: Awake/alert Behavior During Therapy: WFL for tasks assessed/performed Overall Cognitive Status: Within Functional Limits for tasks assessed                                          Exercises Total Joint Exercises Ankle Circles/Pumps: AROM;Both;10 reps Quad Sets: AROM;Both;10 reps Heel Slides: AAROM;Left;10 reps Hip ABduction/ADduction: AAROM;Left;10 reps Straight Leg Raises: AAROM;Left;10 reps Goniometric ROM: ~10-65 degrees    General Comments        Pertinent Vitals/Pain Pain Assessment: 0-10 Pain Score: 6  Pain Location: L knee Pain Descriptors / Indicators: Discomfort;Sore;Aching Pain  Intervention(s): Limited activity within patient's tolerance;Monitored during session;Repositioned    Home Living                          Prior Function            PT Goals (current goals can now be found in the care plan section) Progress towards PT goals: Progressing toward goals    Frequency    7X/week      PT Plan Current plan remains appropriate    Co-evaluation              AM-PAC PT "6  Clicks" Mobility   Outcome Measure  Help needed turning from your back to your side while in a flat bed without using bedrails?: A Little Help needed moving from lying on your back to sitting on the side of a flat bed without using bedrails?: A Little Help needed moving to and from a bed to a chair (including a wheelchair)?: A Little Help needed standing up from a chair using your arms (e.g., wheelchair or bedside chair)?: A Little Help needed to walk in hospital room?: A Little Help needed climbing 3-5 steps with a railing? : A Little 6 Click Score: 18    End of Session Equipment Utilized During Treatment: Gait belt;Left knee immobilizer Activity Tolerance: Patient tolerated treatment well Patient left: in bed;with call bell/phone within reach;with bed alarm set   PT Visit Diagnosis: Other abnormalities of gait and mobility (R26.89);Pain Pain - Right/Left: Left Pain - part of body: Knee     Time: 8676-7209 PT Time Calculation (min) (ACUTE ONLY): 34 min  Charges:  $Gait Training: 8-22 mins $Therapeutic Exercise: 8-22 mins                         Doreatha Massed, PT Acute Rehabilitation  Office: [phone] Pager: [phone]

## 2020-11-30 ENCOUNTER — Encounter (HOSPITAL_COMMUNITY): Payer: Self-pay | Admitting: Orthopaedic Surgery

## 2020-11-30 DIAGNOSIS — R69 Illness, unspecified: Secondary | ICD-10-CM | POA: Diagnosis not present

## 2020-11-30 DIAGNOSIS — T84093D Other mechanical complication of internal left knee prosthesis, subsequent encounter: Secondary | ICD-10-CM | POA: Diagnosis not present

## 2020-11-30 DIAGNOSIS — M19042 Primary osteoarthritis, left hand: Secondary | ICD-10-CM | POA: Diagnosis not present

## 2020-11-30 DIAGNOSIS — H353222 Exudative age-related macular degeneration, left eye, with inactive choroidal neovascularization: Secondary | ICD-10-CM | POA: Diagnosis not present

## 2020-11-30 DIAGNOSIS — G4733 Obstructive sleep apnea (adult) (pediatric): Secondary | ICD-10-CM | POA: Diagnosis not present

## 2020-11-30 DIAGNOSIS — M19041 Primary osteoarthritis, right hand: Secondary | ICD-10-CM | POA: Diagnosis not present

## 2020-11-30 DIAGNOSIS — I10 Essential (primary) hypertension: Secondary | ICD-10-CM | POA: Diagnosis not present

## 2020-11-30 DIAGNOSIS — Z471 Aftercare following joint replacement surgery: Secondary | ICD-10-CM | POA: Diagnosis not present

## 2020-11-30 DIAGNOSIS — I872 Venous insufficiency (chronic) (peripheral): Secondary | ICD-10-CM | POA: Diagnosis not present

## 2020-11-30 DIAGNOSIS — E669 Obesity, unspecified: Secondary | ICD-10-CM | POA: Diagnosis not present

## 2020-11-30 DIAGNOSIS — Z6838 Body mass index (BMI) 38.0-38.9, adult: Secondary | ICD-10-CM | POA: Diagnosis not present

## 2020-12-01 DIAGNOSIS — I872 Venous insufficiency (chronic) (peripheral): Secondary | ICD-10-CM | POA: Diagnosis not present

## 2020-12-01 DIAGNOSIS — G4733 Obstructive sleep apnea (adult) (pediatric): Secondary | ICD-10-CM | POA: Diagnosis not present

## 2020-12-01 DIAGNOSIS — Z6838 Body mass index (BMI) 38.0-38.9, adult: Secondary | ICD-10-CM | POA: Diagnosis not present

## 2020-12-01 DIAGNOSIS — R69 Illness, unspecified: Secondary | ICD-10-CM | POA: Diagnosis not present

## 2020-12-01 DIAGNOSIS — M19042 Primary osteoarthritis, left hand: Secondary | ICD-10-CM | POA: Diagnosis not present

## 2020-12-01 DIAGNOSIS — E669 Obesity, unspecified: Secondary | ICD-10-CM | POA: Diagnosis not present

## 2020-12-01 DIAGNOSIS — M19041 Primary osteoarthritis, right hand: Secondary | ICD-10-CM | POA: Diagnosis not present

## 2020-12-01 DIAGNOSIS — I10 Essential (primary) hypertension: Secondary | ICD-10-CM | POA: Diagnosis not present

## 2020-12-01 DIAGNOSIS — H353222 Exudative age-related macular degeneration, left eye, with inactive choroidal neovascularization: Secondary | ICD-10-CM | POA: Diagnosis not present

## 2020-12-01 DIAGNOSIS — T84093D Other mechanical complication of internal left knee prosthesis, subsequent encounter: Secondary | ICD-10-CM | POA: Diagnosis not present

## 2020-12-02 DIAGNOSIS — G4733 Obstructive sleep apnea (adult) (pediatric): Secondary | ICD-10-CM | POA: Diagnosis not present

## 2020-12-02 DIAGNOSIS — E669 Obesity, unspecified: Secondary | ICD-10-CM | POA: Diagnosis not present

## 2020-12-02 DIAGNOSIS — R69 Illness, unspecified: Secondary | ICD-10-CM | POA: Diagnosis not present

## 2020-12-02 DIAGNOSIS — H353222 Exudative age-related macular degeneration, left eye, with inactive choroidal neovascularization: Secondary | ICD-10-CM | POA: Diagnosis not present

## 2020-12-02 DIAGNOSIS — I10 Essential (primary) hypertension: Secondary | ICD-10-CM | POA: Diagnosis not present

## 2020-12-02 DIAGNOSIS — Z6838 Body mass index (BMI) 38.0-38.9, adult: Secondary | ICD-10-CM | POA: Diagnosis not present

## 2020-12-02 DIAGNOSIS — I872 Venous insufficiency (chronic) (peripheral): Secondary | ICD-10-CM | POA: Diagnosis not present

## 2020-12-02 DIAGNOSIS — T84093D Other mechanical complication of internal left knee prosthesis, subsequent encounter: Secondary | ICD-10-CM | POA: Diagnosis not present

## 2020-12-02 DIAGNOSIS — M19041 Primary osteoarthritis, right hand: Secondary | ICD-10-CM | POA: Diagnosis not present

## 2020-12-02 DIAGNOSIS — M19042 Primary osteoarthritis, left hand: Secondary | ICD-10-CM | POA: Diagnosis not present

## 2020-12-05 DIAGNOSIS — G4733 Obstructive sleep apnea (adult) (pediatric): Secondary | ICD-10-CM | POA: Diagnosis not present

## 2020-12-05 DIAGNOSIS — M19041 Primary osteoarthritis, right hand: Secondary | ICD-10-CM | POA: Diagnosis not present

## 2020-12-05 DIAGNOSIS — M19042 Primary osteoarthritis, left hand: Secondary | ICD-10-CM | POA: Diagnosis not present

## 2020-12-05 DIAGNOSIS — I872 Venous insufficiency (chronic) (peripheral): Secondary | ICD-10-CM | POA: Diagnosis not present

## 2020-12-05 DIAGNOSIS — T84093D Other mechanical complication of internal left knee prosthesis, subsequent encounter: Secondary | ICD-10-CM | POA: Diagnosis not present

## 2020-12-05 DIAGNOSIS — Z6838 Body mass index (BMI) 38.0-38.9, adult: Secondary | ICD-10-CM | POA: Diagnosis not present

## 2020-12-05 DIAGNOSIS — H353222 Exudative age-related macular degeneration, left eye, with inactive choroidal neovascularization: Secondary | ICD-10-CM | POA: Diagnosis not present

## 2020-12-05 DIAGNOSIS — R69 Illness, unspecified: Secondary | ICD-10-CM | POA: Diagnosis not present

## 2020-12-05 DIAGNOSIS — E669 Obesity, unspecified: Secondary | ICD-10-CM | POA: Diagnosis not present

## 2020-12-05 DIAGNOSIS — I10 Essential (primary) hypertension: Secondary | ICD-10-CM | POA: Diagnosis not present

## 2020-12-07 ENCOUNTER — Telehealth: Payer: Self-pay

## 2020-12-07 DIAGNOSIS — Z6838 Body mass index (BMI) 38.0-38.9, adult: Secondary | ICD-10-CM | POA: Diagnosis not present

## 2020-12-07 DIAGNOSIS — M19041 Primary osteoarthritis, right hand: Secondary | ICD-10-CM | POA: Diagnosis not present

## 2020-12-07 DIAGNOSIS — I872 Venous insufficiency (chronic) (peripheral): Secondary | ICD-10-CM | POA: Diagnosis not present

## 2020-12-07 DIAGNOSIS — T84093D Other mechanical complication of internal left knee prosthesis, subsequent encounter: Secondary | ICD-10-CM | POA: Diagnosis not present

## 2020-12-07 DIAGNOSIS — R69 Illness, unspecified: Secondary | ICD-10-CM | POA: Diagnosis not present

## 2020-12-07 DIAGNOSIS — G4733 Obstructive sleep apnea (adult) (pediatric): Secondary | ICD-10-CM | POA: Diagnosis not present

## 2020-12-07 DIAGNOSIS — M19042 Primary osteoarthritis, left hand: Secondary | ICD-10-CM | POA: Diagnosis not present

## 2020-12-07 DIAGNOSIS — H353222 Exudative age-related macular degeneration, left eye, with inactive choroidal neovascularization: Secondary | ICD-10-CM | POA: Diagnosis not present

## 2020-12-07 DIAGNOSIS — E669 Obesity, unspecified: Secondary | ICD-10-CM | POA: Diagnosis not present

## 2020-12-07 DIAGNOSIS — I10 Essential (primary) hypertension: Secondary | ICD-10-CM | POA: Diagnosis not present

## 2020-12-07 NOTE — Telephone Encounter (Signed)
Miranda, nurse case coordinator, with [facility] left a voicemail stating she is working with patient and wanted to leave her name and number in case the office has any needs.   Phone [phone]

## 2020-12-08 ENCOUNTER — Encounter: Payer: Self-pay | Admitting: Orthopaedic Surgery

## 2020-12-08 ENCOUNTER — Ambulatory Visit (INDEPENDENT_AMBULATORY_CARE_PROVIDER_SITE_OTHER): Payer: Medicare HMO | Admitting: Orthopaedic Surgery

## 2020-12-08 DIAGNOSIS — Z96659 Presence of unspecified artificial knee joint: Secondary | ICD-10-CM

## 2020-12-08 DIAGNOSIS — T84018D Broken internal joint prosthesis, other site, subsequent encounter: Secondary | ICD-10-CM

## 2020-12-08 DIAGNOSIS — Z96652 Presence of left artificial knee joint: Secondary | ICD-10-CM

## 2020-12-08 MED ORDER — OXYCODONE HCL 5 MG PO TABS: 5.0000 mg | ORAL_TABLET | Freq: Four times a day (QID) | ORAL | 0 refills | Status: DC | PRN

## 2020-12-08 NOTE — Progress Notes (Signed)
The patient is 2 weeks tomorrow status post revision arthroplasty of the left failed total knee arthroplasty.  She is having home therapy.  She needs that extended because she has no transportation at all.  She did state that they have been able to flex her knee to past 80 degrees.  She feels like she is doing well overall.  She is walking with her walker but having quite a bit of swelling.  On exam her calf is soft.  She has been on a baby aspirin twice a day.  She can flex and extend her foot.  The knee is swollen to be expected.  I am fine with her wearing compressive sleeve around the knee.  I did give her a note to get back to her home therapy to extend this for 4-6 more weeks as there are transportation issues.  I will refill her oxycodone as well.  All questions and concerns were answered and addressed.  I like to see her back in 4 weeks for repeat exam but no x-rays are needed.

## 2020-12-09 DIAGNOSIS — M19041 Primary osteoarthritis, right hand: Secondary | ICD-10-CM | POA: Diagnosis not present

## 2020-12-09 DIAGNOSIS — Z6838 Body mass index (BMI) 38.0-38.9, adult: Secondary | ICD-10-CM | POA: Diagnosis not present

## 2020-12-09 DIAGNOSIS — I10 Essential (primary) hypertension: Secondary | ICD-10-CM | POA: Diagnosis not present

## 2020-12-09 DIAGNOSIS — T84093D Other mechanical complication of internal left knee prosthesis, subsequent encounter: Secondary | ICD-10-CM | POA: Diagnosis not present

## 2020-12-09 DIAGNOSIS — G4733 Obstructive sleep apnea (adult) (pediatric): Secondary | ICD-10-CM | POA: Diagnosis not present

## 2020-12-09 DIAGNOSIS — M19042 Primary osteoarthritis, left hand: Secondary | ICD-10-CM | POA: Diagnosis not present

## 2020-12-09 DIAGNOSIS — I872 Venous insufficiency (chronic) (peripheral): Secondary | ICD-10-CM | POA: Diagnosis not present

## 2020-12-09 DIAGNOSIS — R69 Illness, unspecified: Secondary | ICD-10-CM | POA: Diagnosis not present

## 2020-12-09 DIAGNOSIS — H353222 Exudative age-related macular degeneration, left eye, with inactive choroidal neovascularization: Secondary | ICD-10-CM | POA: Diagnosis not present

## 2020-12-09 DIAGNOSIS — E669 Obesity, unspecified: Secondary | ICD-10-CM | POA: Diagnosis not present

## 2020-12-12 DIAGNOSIS — Z6838 Body mass index (BMI) 38.0-38.9, adult: Secondary | ICD-10-CM | POA: Diagnosis not present

## 2020-12-12 DIAGNOSIS — E669 Obesity, unspecified: Secondary | ICD-10-CM | POA: Diagnosis not present

## 2020-12-12 DIAGNOSIS — T84093D Other mechanical complication of internal left knee prosthesis, subsequent encounter: Secondary | ICD-10-CM | POA: Diagnosis not present

## 2020-12-12 DIAGNOSIS — R69 Illness, unspecified: Secondary | ICD-10-CM | POA: Diagnosis not present

## 2020-12-12 DIAGNOSIS — M19041 Primary osteoarthritis, right hand: Secondary | ICD-10-CM | POA: Diagnosis not present

## 2020-12-12 DIAGNOSIS — G4733 Obstructive sleep apnea (adult) (pediatric): Secondary | ICD-10-CM | POA: Diagnosis not present

## 2020-12-12 DIAGNOSIS — M19042 Primary osteoarthritis, left hand: Secondary | ICD-10-CM | POA: Diagnosis not present

## 2020-12-12 DIAGNOSIS — I10 Essential (primary) hypertension: Secondary | ICD-10-CM | POA: Diagnosis not present

## 2020-12-12 DIAGNOSIS — I872 Venous insufficiency (chronic) (peripheral): Secondary | ICD-10-CM | POA: Diagnosis not present

## 2020-12-12 DIAGNOSIS — H353222 Exudative age-related macular degeneration, left eye, with inactive choroidal neovascularization: Secondary | ICD-10-CM | POA: Diagnosis not present

## 2020-12-12 NOTE — Progress Notes (Deleted)
Office Visit Note  Patient: Taylor Mccall             Date of Birth: [birth year]           MRN: [id number]             PCP: Dorothyann Peng, NP Referring: Dorothyann Peng, NP Visit Date: 12/26/2020 Occupation: @GUAROCC @  Subjective:    History of Present Illness: Taylor Mccall is a 75 y.o. female with history of autoimmune disease, osteoarthritis, and DDD.   Activities of Daily Living:  Patient reports morning stiffness for *** {minute/hour:19697}.   Patient {ACTIONS;DENIES/REPORTS:21021675::"Denies"} nocturnal pain.  Difficulty dressing/grooming: {ACTIONS;DENIES/REPORTS:21021675::"Denies"} Difficulty climbing stairs: {ACTIONS;DENIES/REPORTS:21021675::"Denies"} Difficulty getting out of chair: {ACTIONS;DENIES/REPORTS:21021675::"Denies"} Difficulty using hands for taps, buttons, cutlery, and/or writing: {ACTIONS;DENIES/REPORTS:21021675::"Denies"}  No Rheumatology ROS completed.   PMFS History:  Patient Active Problem List   Diagnosis Date Noted  . Status post revision of total knee replacement, left 11/25/2020  . Failed total knee, left, subsequent encounter 11/24/2020  . Status post revision of total hip 04/01/2020  . Polyethylene liner wear following total hip arthroplasty requiring isolated polyethylene liner exchange ([place]) 03/31/2020  . Intermediate stage nonexudative age-related macular degeneration of right eye 12/23/2019  . Exudative age-related macular degeneration of left eye with inactive choroidal neovascularization ([place]) 12/23/2019  . Primary open angle glaucoma of both eyes, moderate stage 12/23/2019  . Exudative retinopathy of left eye 12/23/2019  . Routine general medical examination at a health care facility 12/11/2016  . Primary osteoarthritis of left hip 05/01/2016  . Autoimmune disease ([place]) 11/29/2015  . High risk medication use 11/29/2015  . Vitamin D deficiency 11/29/2015  . Macular degeneration 11/29/2015  . Bilateral lower extremity edema  10/26/2015  . H/O total knee replacement, bilateral 12/09/2013  . Breast mass, left 01/18/2011  . Obesity 12/08/2009  . Venous (peripheral) insufficiency 07/28/2009  . PAIN IN JOINT, ANKLE AND FOOT 01/12/2008  . Obstructive sleep apnea 08/29/2007  . GAIT DISTURBANCE 08/14/2007  . Depression 08/26/2006  . Essential hypertension 08/26/2006  . Primary osteoarthritis of both hands 08/26/2006  . Urinary incontinence 08/26/2006    Past Medical History:  Diagnosis Date  . Anxiety   . Blood transfusion without reported diagnosis   . Breast mass, left   . Cataract   . Depression   . Difficult intubation 04/01/2020   Known difficult airway from previous records  . Gait disturbance   . GERD (gastroesophageal reflux disease)   . Hypertension   . Lymphedema of both lower extremities    Seeing OT at [facility] to Left Lower leg  . Obesity   . Rheumatoid arthritis(714.0)   . Sleep apnea    cpap  . Urinary incontinence   . Venous insufficiency     Family History  Problem Relation Age of Onset  . Diabetes Other   . Stroke Other   . Asthma Brother   . Heart disease Brother   . Coronary artery disease Father   . Skin cancer Father   . Heart disease Father   . Coronary artery disease Brother   . Colon cancer Neg Hx   . Esophageal cancer Neg Hx   . Rectal cancer Neg Hx   . Stomach cancer Neg Hx    Past Surgical History:  Procedure Laterality Date  . ABDOMINAL HYSTERECTOMY     with vesicovaginal fistula closure  . ANTERIOR HIP REVISION Left 04/01/2020   Procedure: ANTERIOR LEFT HIP REVISION OF ACETABULAR COMPONENT;  Surgeon: Ninfa Linden,  Lind Guest, MD;  Location: [facility];  Service: Orthopedics;  Laterality: Left;  3E  . BLADDER SUSPENSION  2009   sling  . BREAST REDUCTION SURGERY    . COLONOSCOPY    . ESOPHAGOGASTRODUODENOSCOPY    . EYE SURGERY    . REPLACEMENT TOTAL KNEE Left   . TOE SURGERY Left    Left great toe  . toenail removal Right    all  5 toenails  . TOTAL HIP ARTHROPLASTY    . TOTAL KNEE ARTHROPLASTY Right 12/09/2013   Procedure: Right Total Knee Arthroplasty;  Surgeon: Newt Minion, MD;  Location: [facility];  Service: Orthopedics;  Laterality: Right;  . TOTAL KNEE REVISION Left 11/25/2020   Procedure: LEFT TOTAL KNEE REVISION;  Surgeon: Mcarthur Rossetti, MD;  Location: [facility];  Service: Orthopedics;  Laterality: Left;   Social History   Social History Narrative  . Not on file   Immunization History  Administered Date(s) Administered  . Influenza Split 12/14/2010, 12/06/2012  . Influenza Whole 02/06/2004, 12/08/2009  . Influenza, High Dose Seasonal PF 10/26/2015, 12/11/2016, 01/06/2018, 10/07/2018, 11/15/2020  . Influenza,inj,Quad PF,6+ Mos 10/15/2013, 11/19/2014  . Influenza-Unspecified 10/07/2018  . PFIZER(Purple Top)SARS-COV-2 Vaccination 03/01/2019, 03/21/2019, 10/06/2019  . Pension scheme manager 64yrs & up 11/15/2020  . Pneumococcal Conjugate-13 03/18/2013  . Pneumococcal Polysaccharide-23 06/30/2008, 10/26/2015  . Td 02/05/2005  . Tdap 12/14/2010     Objective: Vital Signs: There were no vitals taken for this visit.   Physical Exam Vitals and nursing note reviewed.  Constitutional:      Appearance: She is well-developed.  HENT:     Head: Normocephalic and atraumatic.  Eyes:     Conjunctiva/sclera: Conjunctivae normal.  Pulmonary:     Effort: Pulmonary effort is normal.  Abdominal:     Palpations: Abdomen is soft.  Musculoskeletal:     Cervical back: Normal range of motion.  Skin:    General: Skin is warm and dry.     Capillary Refill: Capillary refill takes less than 2 seconds.  Neurological:     Mental Status: She is alert and oriented to person, place, and time.  Psychiatric:        Behavior: Behavior normal.     Musculoskeletal Exam: ***  CDAI Exam: CDAI Score: -- Patient Global: --; Provider Global: -- Swollen: --; Tender: -- Joint Exam 12/26/2020   No  joint exam has been documented for this visit   There is currently no information documented on the homunculus. Go to the Rheumatology activity and complete the homunculus joint exam.  Investigation: No additional findings.  Imaging: DG Knee Left Port  Result Date: 11/25/2020 CLINICAL DATA:  Status post revision of total knee replacement. EXAM: PORTABLE LEFT KNEE - 1-2 VIEW COMPARISON:  Radiograph 09/21/2020 FINDINGS: Revision left knee arthroplasty in expected alignment. No periprosthetic lucency or fracture. Patellofemoral arthroplasty. Recent postsurgical change includes air and edema in the soft tissues and joint space. Anterior skin staples in place. IMPRESSION: Revision left knee arthroplasty without immediate postoperative complication. Electronically Signed   By: Keith Rake M.D.   On: 11/25/2020 16:00    Recent Labs: Lab Results  Component Value Date   WBC 10.7 (H) 11/26/2020   HGB 10.8 (L) 11/26/2020   PLT 232 11/26/2020   NA 138 11/26/2020   K 4.0 11/26/2020   CL 109 11/26/2020   CO2 23 11/26/2020   GLUCOSE 148 (H) 11/26/2020   BUN 16 11/26/2020   CREATININE 0.59 11/26/2020   BILITOT  0.7 09/13/2020   ALKPHOS 81 09/13/2020   AST 13 09/13/2020   ALT 9 09/13/2020   PROT 7.0 09/13/2020   ALBUMIN 4.1 09/13/2020   CALCIUM 8.6 (L) 11/26/2020   GFRAA 98 07/26/2020    Speciality Comments: PLQ eye exam: 11/10/2020 WNL @ [facility]. Follow up in 6 months.  Procedures:  No procedures performed Allergies: Penicillins   Assessment / Plan:     Visit Diagnoses: Autoimmune disease ([place])  High risk medication use  Chronic left shoulder pain  Primary osteoarthritis of both hands  S/P total left hip arthroplasty  H/O total knee replacement, bilateral  DDD (degenerative disc disease), cervical  DDD (degenerative disc disease), lumbar  History of macular degeneration  Vitamin D deficiency  History of depression  History of sleep apnea  Pedal  edema  Orders: No orders of the defined types were placed in this encounter.  No orders of the defined types were placed in this encounter.   Face-to-face time spent with patient was *** minutes. Greater than 50% of time was spent in counseling and coordination of care.  Follow-Up Instructions: No follow-ups on file.   Ofilia Neas, PA-C  Note - This record has been created using Dragon software.  Chart creation errors have been sought, but may not always  have been located. Such creation errors do not reflect on  the standard of medical care.

## 2020-12-13 DIAGNOSIS — G4733 Obstructive sleep apnea (adult) (pediatric): Secondary | ICD-10-CM | POA: Diagnosis not present

## 2020-12-14 DIAGNOSIS — Z6838 Body mass index (BMI) 38.0-38.9, adult: Secondary | ICD-10-CM | POA: Diagnosis not present

## 2020-12-14 DIAGNOSIS — M19042 Primary osteoarthritis, left hand: Secondary | ICD-10-CM | POA: Diagnosis not present

## 2020-12-14 DIAGNOSIS — G4733 Obstructive sleep apnea (adult) (pediatric): Secondary | ICD-10-CM | POA: Diagnosis not present

## 2020-12-14 DIAGNOSIS — I872 Venous insufficiency (chronic) (peripheral): Secondary | ICD-10-CM | POA: Diagnosis not present

## 2020-12-14 DIAGNOSIS — T84093D Other mechanical complication of internal left knee prosthesis, subsequent encounter: Secondary | ICD-10-CM | POA: Diagnosis not present

## 2020-12-14 DIAGNOSIS — R69 Illness, unspecified: Secondary | ICD-10-CM | POA: Diagnosis not present

## 2020-12-14 DIAGNOSIS — H353222 Exudative age-related macular degeneration, left eye, with inactive choroidal neovascularization: Secondary | ICD-10-CM | POA: Diagnosis not present

## 2020-12-14 DIAGNOSIS — I10 Essential (primary) hypertension: Secondary | ICD-10-CM | POA: Diagnosis not present

## 2020-12-14 DIAGNOSIS — M19041 Primary osteoarthritis, right hand: Secondary | ICD-10-CM | POA: Diagnosis not present

## 2020-12-14 DIAGNOSIS — E669 Obesity, unspecified: Secondary | ICD-10-CM | POA: Diagnosis not present

## 2020-12-16 DIAGNOSIS — G4733 Obstructive sleep apnea (adult) (pediatric): Secondary | ICD-10-CM | POA: Diagnosis not present

## 2020-12-16 DIAGNOSIS — Z6838 Body mass index (BMI) 38.0-38.9, adult: Secondary | ICD-10-CM | POA: Diagnosis not present

## 2020-12-16 DIAGNOSIS — M19041 Primary osteoarthritis, right hand: Secondary | ICD-10-CM | POA: Diagnosis not present

## 2020-12-16 DIAGNOSIS — H353222 Exudative age-related macular degeneration, left eye, with inactive choroidal neovascularization: Secondary | ICD-10-CM | POA: Diagnosis not present

## 2020-12-16 DIAGNOSIS — T84093D Other mechanical complication of internal left knee prosthesis, subsequent encounter: Secondary | ICD-10-CM | POA: Diagnosis not present

## 2020-12-16 DIAGNOSIS — I10 Essential (primary) hypertension: Secondary | ICD-10-CM | POA: Diagnosis not present

## 2020-12-16 DIAGNOSIS — I872 Venous insufficiency (chronic) (peripheral): Secondary | ICD-10-CM | POA: Diagnosis not present

## 2020-12-16 DIAGNOSIS — E669 Obesity, unspecified: Secondary | ICD-10-CM | POA: Diagnosis not present

## 2020-12-16 DIAGNOSIS — M19042 Primary osteoarthritis, left hand: Secondary | ICD-10-CM | POA: Diagnosis not present

## 2020-12-16 DIAGNOSIS — R69 Illness, unspecified: Secondary | ICD-10-CM | POA: Diagnosis not present

## 2020-12-18 ENCOUNTER — Other Ambulatory Visit: Payer: Self-pay | Admitting: Adult Health

## 2020-12-19 DIAGNOSIS — I10 Essential (primary) hypertension: Secondary | ICD-10-CM | POA: Diagnosis not present

## 2020-12-19 DIAGNOSIS — T84093D Other mechanical complication of internal left knee prosthesis, subsequent encounter: Secondary | ICD-10-CM | POA: Diagnosis not present

## 2020-12-19 DIAGNOSIS — M19042 Primary osteoarthritis, left hand: Secondary | ICD-10-CM | POA: Diagnosis not present

## 2020-12-19 DIAGNOSIS — G4733 Obstructive sleep apnea (adult) (pediatric): Secondary | ICD-10-CM | POA: Diagnosis not present

## 2020-12-19 DIAGNOSIS — R69 Illness, unspecified: Secondary | ICD-10-CM | POA: Diagnosis not present

## 2020-12-19 DIAGNOSIS — I872 Venous insufficiency (chronic) (peripheral): Secondary | ICD-10-CM | POA: Diagnosis not present

## 2020-12-19 DIAGNOSIS — Z6838 Body mass index (BMI) 38.0-38.9, adult: Secondary | ICD-10-CM | POA: Diagnosis not present

## 2020-12-19 DIAGNOSIS — E669 Obesity, unspecified: Secondary | ICD-10-CM | POA: Diagnosis not present

## 2020-12-19 DIAGNOSIS — H353222 Exudative age-related macular degeneration, left eye, with inactive choroidal neovascularization: Secondary | ICD-10-CM | POA: Diagnosis not present

## 2020-12-19 DIAGNOSIS — M19041 Primary osteoarthritis, right hand: Secondary | ICD-10-CM | POA: Diagnosis not present

## 2020-12-21 ENCOUNTER — Encounter (INDEPENDENT_AMBULATORY_CARE_PROVIDER_SITE_OTHER): Payer: Medicare HMO | Admitting: Ophthalmology

## 2020-12-21 DIAGNOSIS — G4733 Obstructive sleep apnea (adult) (pediatric): Secondary | ICD-10-CM | POA: Diagnosis not present

## 2020-12-21 DIAGNOSIS — I872 Venous insufficiency (chronic) (peripheral): Secondary | ICD-10-CM | POA: Diagnosis not present

## 2020-12-21 DIAGNOSIS — E669 Obesity, unspecified: Secondary | ICD-10-CM | POA: Diagnosis not present

## 2020-12-21 DIAGNOSIS — M19042 Primary osteoarthritis, left hand: Secondary | ICD-10-CM | POA: Diagnosis not present

## 2020-12-21 DIAGNOSIS — R69 Illness, unspecified: Secondary | ICD-10-CM | POA: Diagnosis not present

## 2020-12-21 DIAGNOSIS — H353222 Exudative age-related macular degeneration, left eye, with inactive choroidal neovascularization: Secondary | ICD-10-CM | POA: Diagnosis not present

## 2020-12-21 DIAGNOSIS — T84093D Other mechanical complication of internal left knee prosthesis, subsequent encounter: Secondary | ICD-10-CM | POA: Diagnosis not present

## 2020-12-21 DIAGNOSIS — M19041 Primary osteoarthritis, right hand: Secondary | ICD-10-CM | POA: Diagnosis not present

## 2020-12-21 DIAGNOSIS — Z6838 Body mass index (BMI) 38.0-38.9, adult: Secondary | ICD-10-CM | POA: Diagnosis not present

## 2020-12-21 DIAGNOSIS — I10 Essential (primary) hypertension: Secondary | ICD-10-CM | POA: Diagnosis not present

## 2020-12-23 DIAGNOSIS — T84093D Other mechanical complication of internal left knee prosthesis, subsequent encounter: Secondary | ICD-10-CM | POA: Diagnosis not present

## 2020-12-23 DIAGNOSIS — I10 Essential (primary) hypertension: Secondary | ICD-10-CM | POA: Diagnosis not present

## 2020-12-23 DIAGNOSIS — Z6838 Body mass index (BMI) 38.0-38.9, adult: Secondary | ICD-10-CM | POA: Diagnosis not present

## 2020-12-23 DIAGNOSIS — M19042 Primary osteoarthritis, left hand: Secondary | ICD-10-CM | POA: Diagnosis not present

## 2020-12-23 DIAGNOSIS — E669 Obesity, unspecified: Secondary | ICD-10-CM | POA: Diagnosis not present

## 2020-12-23 DIAGNOSIS — G4733 Obstructive sleep apnea (adult) (pediatric): Secondary | ICD-10-CM | POA: Diagnosis not present

## 2020-12-23 DIAGNOSIS — H353222 Exudative age-related macular degeneration, left eye, with inactive choroidal neovascularization: Secondary | ICD-10-CM | POA: Diagnosis not present

## 2020-12-23 DIAGNOSIS — I872 Venous insufficiency (chronic) (peripheral): Secondary | ICD-10-CM | POA: Diagnosis not present

## 2020-12-23 DIAGNOSIS — M19041 Primary osteoarthritis, right hand: Secondary | ICD-10-CM | POA: Diagnosis not present

## 2020-12-23 DIAGNOSIS — R69 Illness, unspecified: Secondary | ICD-10-CM | POA: Diagnosis not present

## 2020-12-26 ENCOUNTER — Ambulatory Visit: Payer: Medicare HMO | Admitting: Physician Assistant

## 2020-12-26 DIAGNOSIS — R69 Illness, unspecified: Secondary | ICD-10-CM | POA: Diagnosis not present

## 2020-12-26 DIAGNOSIS — I872 Venous insufficiency (chronic) (peripheral): Secondary | ICD-10-CM | POA: Diagnosis not present

## 2020-12-26 DIAGNOSIS — Z8659 Personal history of other mental and behavioral disorders: Secondary | ICD-10-CM

## 2020-12-26 DIAGNOSIS — R600 Localized edema: Secondary | ICD-10-CM

## 2020-12-26 DIAGNOSIS — E669 Obesity, unspecified: Secondary | ICD-10-CM | POA: Diagnosis not present

## 2020-12-26 DIAGNOSIS — E559 Vitamin D deficiency, unspecified: Secondary | ICD-10-CM

## 2020-12-26 DIAGNOSIS — Z96653 Presence of artificial knee joint, bilateral: Secondary | ICD-10-CM

## 2020-12-26 DIAGNOSIS — Z8669 Personal history of other diseases of the nervous system and sense organs: Secondary | ICD-10-CM

## 2020-12-26 DIAGNOSIS — M5136 Other intervertebral disc degeneration, lumbar region: Secondary | ICD-10-CM

## 2020-12-26 DIAGNOSIS — M19041 Primary osteoarthritis, right hand: Secondary | ICD-10-CM | POA: Diagnosis not present

## 2020-12-26 DIAGNOSIS — M359 Systemic involvement of connective tissue, unspecified: Secondary | ICD-10-CM

## 2020-12-26 DIAGNOSIS — H353222 Exudative age-related macular degeneration, left eye, with inactive choroidal neovascularization: Secondary | ICD-10-CM | POA: Diagnosis not present

## 2020-12-26 DIAGNOSIS — T84093D Other mechanical complication of internal left knee prosthesis, subsequent encounter: Secondary | ICD-10-CM | POA: Diagnosis not present

## 2020-12-26 DIAGNOSIS — M5030 Other cervical disc degeneration, unspecified cervical region: Secondary | ICD-10-CM

## 2020-12-26 DIAGNOSIS — Z79899 Other long term (current) drug therapy: Secondary | ICD-10-CM

## 2020-12-26 DIAGNOSIS — G4733 Obstructive sleep apnea (adult) (pediatric): Secondary | ICD-10-CM | POA: Diagnosis not present

## 2020-12-26 DIAGNOSIS — Z96642 Presence of left artificial hip joint: Secondary | ICD-10-CM

## 2020-12-26 DIAGNOSIS — M19042 Primary osteoarthritis, left hand: Secondary | ICD-10-CM | POA: Diagnosis not present

## 2020-12-26 DIAGNOSIS — Z6838 Body mass index (BMI) 38.0-38.9, adult: Secondary | ICD-10-CM | POA: Diagnosis not present

## 2020-12-26 DIAGNOSIS — M19012 Primary osteoarthritis, left shoulder: Secondary | ICD-10-CM

## 2020-12-26 DIAGNOSIS — I10 Essential (primary) hypertension: Secondary | ICD-10-CM | POA: Diagnosis not present

## 2020-12-28 DIAGNOSIS — G4733 Obstructive sleep apnea (adult) (pediatric): Secondary | ICD-10-CM | POA: Diagnosis not present

## 2020-12-28 DIAGNOSIS — M19042 Primary osteoarthritis, left hand: Secondary | ICD-10-CM | POA: Diagnosis not present

## 2020-12-28 DIAGNOSIS — R69 Illness, unspecified: Secondary | ICD-10-CM | POA: Diagnosis not present

## 2020-12-28 DIAGNOSIS — Z6838 Body mass index (BMI) 38.0-38.9, adult: Secondary | ICD-10-CM | POA: Diagnosis not present

## 2020-12-28 DIAGNOSIS — I10 Essential (primary) hypertension: Secondary | ICD-10-CM | POA: Diagnosis not present

## 2020-12-28 DIAGNOSIS — E669 Obesity, unspecified: Secondary | ICD-10-CM | POA: Diagnosis not present

## 2020-12-28 DIAGNOSIS — H353222 Exudative age-related macular degeneration, left eye, with inactive choroidal neovascularization: Secondary | ICD-10-CM | POA: Diagnosis not present

## 2020-12-28 DIAGNOSIS — T84093D Other mechanical complication of internal left knee prosthesis, subsequent encounter: Secondary | ICD-10-CM | POA: Diagnosis not present

## 2020-12-28 DIAGNOSIS — I872 Venous insufficiency (chronic) (peripheral): Secondary | ICD-10-CM | POA: Diagnosis not present

## 2020-12-28 DIAGNOSIS — M19041 Primary osteoarthritis, right hand: Secondary | ICD-10-CM | POA: Diagnosis not present

## 2021-01-02 DIAGNOSIS — E669 Obesity, unspecified: Secondary | ICD-10-CM | POA: Diagnosis not present

## 2021-01-02 DIAGNOSIS — Z6838 Body mass index (BMI) 38.0-38.9, adult: Secondary | ICD-10-CM | POA: Diagnosis not present

## 2021-01-02 DIAGNOSIS — I10 Essential (primary) hypertension: Secondary | ICD-10-CM | POA: Diagnosis not present

## 2021-01-02 DIAGNOSIS — M19041 Primary osteoarthritis, right hand: Secondary | ICD-10-CM | POA: Diagnosis not present

## 2021-01-02 DIAGNOSIS — H353222 Exudative age-related macular degeneration, left eye, with inactive choroidal neovascularization: Secondary | ICD-10-CM | POA: Diagnosis not present

## 2021-01-02 DIAGNOSIS — T84093D Other mechanical complication of internal left knee prosthesis, subsequent encounter: Secondary | ICD-10-CM | POA: Diagnosis not present

## 2021-01-02 DIAGNOSIS — I872 Venous insufficiency (chronic) (peripheral): Secondary | ICD-10-CM | POA: Diagnosis not present

## 2021-01-02 DIAGNOSIS — R69 Illness, unspecified: Secondary | ICD-10-CM | POA: Diagnosis not present

## 2021-01-02 DIAGNOSIS — M19042 Primary osteoarthritis, left hand: Secondary | ICD-10-CM | POA: Diagnosis not present

## 2021-01-02 DIAGNOSIS — G4733 Obstructive sleep apnea (adult) (pediatric): Secondary | ICD-10-CM | POA: Diagnosis not present

## 2021-01-05 ENCOUNTER — Ambulatory Visit (INDEPENDENT_AMBULATORY_CARE_PROVIDER_SITE_OTHER): Payer: Medicare HMO | Admitting: Orthopaedic Surgery

## 2021-01-05 ENCOUNTER — Encounter: Payer: Self-pay | Admitting: Orthopaedic Surgery

## 2021-01-05 DIAGNOSIS — I872 Venous insufficiency (chronic) (peripheral): Secondary | ICD-10-CM | POA: Diagnosis not present

## 2021-01-05 DIAGNOSIS — M19042 Primary osteoarthritis, left hand: Secondary | ICD-10-CM | POA: Diagnosis not present

## 2021-01-05 DIAGNOSIS — Z6838 Body mass index (BMI) 38.0-38.9, adult: Secondary | ICD-10-CM | POA: Diagnosis not present

## 2021-01-05 DIAGNOSIS — E669 Obesity, unspecified: Secondary | ICD-10-CM | POA: Diagnosis not present

## 2021-01-05 DIAGNOSIS — G4733 Obstructive sleep apnea (adult) (pediatric): Secondary | ICD-10-CM | POA: Diagnosis not present

## 2021-01-05 DIAGNOSIS — H353222 Exudative age-related macular degeneration, left eye, with inactive choroidal neovascularization: Secondary | ICD-10-CM | POA: Diagnosis not present

## 2021-01-05 DIAGNOSIS — R69 Illness, unspecified: Secondary | ICD-10-CM | POA: Diagnosis not present

## 2021-01-05 DIAGNOSIS — M19041 Primary osteoarthritis, right hand: Secondary | ICD-10-CM | POA: Diagnosis not present

## 2021-01-05 DIAGNOSIS — I10 Essential (primary) hypertension: Secondary | ICD-10-CM | POA: Diagnosis not present

## 2021-01-05 DIAGNOSIS — T84093D Other mechanical complication of internal left knee prosthesis, subsequent encounter: Secondary | ICD-10-CM | POA: Diagnosis not present

## 2021-01-05 DIAGNOSIS — Z96652 Presence of left artificial knee joint: Secondary | ICD-10-CM

## 2021-01-05 NOTE — Progress Notes (Signed)
The patient is now 6 weeks status post a left knee revision arthroplasty after a failed total knee arthroplasty that had been in for a long period of time.  She is now off of pain medication.  She is ambulate with a cane.  She has been to home therapy who have now released her.  She is not taking anything for pain medication and reports that she is doing very well overall.  She reports increased strength of the left knee and increased motion.  Examination of her left knee does show some swelling to be expected but her range of motion is almost entirely full for her negative motion.  The knee feels stable overall.  From my standpoint I will see her back in 6 weeks.  At that visit I would like an AP and lateral of her left operative knee.  She can drop from my standpoint as well.

## 2021-01-12 DIAGNOSIS — G4733 Obstructive sleep apnea (adult) (pediatric): Secondary | ICD-10-CM | POA: Diagnosis not present

## 2021-01-20 NOTE — Progress Notes (Signed)
Office Visit Note  Patient: Taylor Mccall             Date of Birth: 07/14/1945           MRN: [id number]             PCP: Dorothyann Peng, NP Referring: Dorothyann Peng, NP Visit Date: 02/03/2021 Occupation: @GUAROCC @  Subjective:  Pain in multiple joints   History of Present Illness: Taylor Mccall is a 75 y.o. female with history of autoimmune disease, osteoarthritis, and DDD.  Patient is currently taking Plaquenil 200 mg 1 tablet by mouth twice daily.  She is tolerating Plaquenil without any side effects.  She denies any signs or symptoms of an autoimmune disease flare recently.  She has not had any recent rashes, Raynaud's phenomenon, oral or nasal ulcerations, cervical lymphadenopathy, fevers, shortness of breath, pleuritic chest pain, or palpitations.  She continues to have chronic dry mouth but has not had any increased eye dryness.  She continues to have pain involving multiple joints.  Her pain has been a severe in her neck, left shoulder, and left knee replacement.  She had a left knee revision performed by Dr. Ninfa Linden on 11/25/2020 she continues to have significant lymphedema both lower extremities, left greater than right.  She has been having difficulty wearing a shoe on the left foot due to the severity of edema.  She continues to use a cane to assist with ambulation.  She has not had any recent falls.  Patient was previously evaluated by Dr. Louanne Skye for chronic neck pain.  She went to physical therapy and there was discussion of proceeding with surgery but she declined at that time.  She has been experiencing increased numbness in the right hand.  She had a nerve conduction study in November 2021 which was consistent with carpal tunnel syndrome.  She is not having any increased joint swelling.      Activities of Daily Living:  Patient reports morning stiffness for all day. Patient Reports nocturnal pain.  Difficulty dressing/grooming: Reports Difficulty climbing stairs:  Denies Difficulty getting out of chair: Denies Difficulty using hands for taps, buttons, cutlery, and/or writing: Reports  Review of Systems  Constitutional:  Positive for fatigue.  HENT:  Positive for mouth dryness. Negative for mouth sores and nose dryness.   Eyes:  Positive for pain, itching and dryness.  Respiratory:  Negative for shortness of breath and difficulty breathing.   Cardiovascular:  Negative for chest pain and palpitations.  Gastrointestinal:  Negative for blood in stool, constipation and diarrhea.  Endocrine: Positive for increased urination.  Genitourinary:  Negative for difficulty urinating.  Musculoskeletal:  Positive for joint pain, joint pain, joint swelling, myalgias, morning stiffness, muscle tenderness and myalgias.  Skin:  Negative for color change, rash and redness.  Allergic/Immunologic: Negative for susceptible to infections.  Neurological:  Positive for numbness, headaches and memory loss. Negative for dizziness and weakness.  Hematological:  Positive for bruising/bleeding tendency.  Psychiatric/Behavioral:  Negative for confusion.    PMFS History:  Patient Active Problem List   Diagnosis Date Noted   Status post revision of total knee replacement, left 11/25/2020   Failed total knee, left, subsequent encounter 11/24/2020   Status post revision of total hip 04/01/2020   Polyethylene liner wear following total hip arthroplasty requiring isolated polyethylene liner exchange ([place]) 03/31/2020   Intermediate stage nonexudative age-related macular degeneration of right eye 12/23/2019   Exudative age-related macular degeneration of left eye with inactive choroidal neovascularization ([place])  12/23/2019   Primary open angle glaucoma of both eyes, moderate stage 12/23/2019   Exudative retinopathy of left eye 12/23/2019   Routine general medical examination at a health care facility 12/11/2016   Primary osteoarthritis of left hip 05/01/2016   Autoimmune disease (HCC)  11/29/2015   High risk medication use 11/29/2015   Vitamin D deficiency 11/29/2015   Macular degeneration 11/29/2015   Bilateral lower extremity edema 10/26/2015   H/O total knee replacement, bilateral 12/09/2013   Breast mass, left 01/18/2011   Obesity 12/08/2009   Venous (peripheral) insufficiency 07/28/2009   PAIN IN JOINT, ANKLE AND FOOT 01/12/2008   Obstructive sleep apnea 08/29/2007   GAIT DISTURBANCE 08/14/2007   Depression 08/26/2006   Essential hypertension 08/26/2006   Primary osteoarthritis of both hands 08/26/2006   Urinary incontinence 08/26/2006    Past Medical History:  Diagnosis Date   Anxiety    Blood transfusion without reported diagnosis    Breast mass, left    Cataract    Depression    Difficult intubation 04/01/2020   Known difficult airway from previous records   Gait disturbance    GERD (gastroesophageal reflux disease)    Hypertension    Lymphedema of both lower extremities    Seeing OT at [facility] - dreeeing to Left Lower leg   Obesity    Rheumatoid arthritis(714.0)    Sleep apnea    cpap   Urinary incontinence    Venous insufficiency     Family History  Problem Relation Age of Onset   Diabetes Other    Stroke Other    Asthma Brother    Heart disease Brother    Coronary artery disease Father    Skin cancer Father    Heart disease Father    Coronary artery disease Brother    Colon cancer Neg Hx    Esophageal cancer Neg Hx    Rectal cancer Neg Hx    Stomach cancer Neg Hx    Past Surgical History:  Procedure Laterality Date   ABDOMINAL HYSTERECTOMY     with vesicovaginal fistula closure   ANTERIOR HIP REVISION Left 04/01/2020   Procedure: ANTERIOR LEFT HIP REVISION OF ACETABULAR COMPONENT;  Surgeon: Kathryne Hitch, MD;  Location: [facility];  Service: Orthopedics;  Laterality: Left;  3E   BLADDER SUSPENSION  2009   sling   BREAST REDUCTION SURGERY     COLONOSCOPY     ESOPHAGOGASTRODUODENOSCOPY     EYE SURGERY      REPLACEMENT TOTAL KNEE Left    TOE SURGERY Left    Left great toe   toenail removal Right    all 5 toenails   TOTAL HIP ARTHROPLASTY     TOTAL KNEE ARTHROPLASTY Right 12/09/2013   Procedure: Right Total Knee Arthroplasty;  Surgeon: Nadara Mustard, MD;  Location: [facility];  Service: Orthopedics;  Laterality: Right;   TOTAL KNEE REVISION Left 11/25/2020   Procedure: LEFT TOTAL KNEE REVISION;  Surgeon: Kathryne Hitch, MD;  Location: [facility];  Service: Orthopedics;  Laterality: Left;   Social History   Social History Narrative   Not on file   Immunization History  Administered Date(s) Administered   Influenza Split 12/14/2010, 12/06/2012   Influenza Whole 02/06/2004, 12/08/2009   Influenza, High Dose Seasonal PF 10/26/2015, 12/11/2016, 01/06/2018, 10/07/2018, 11/15/2020   Influenza,inj,Quad PF,6+ Mos 10/15/2013, 11/19/2014   Influenza-Unspecified 10/07/2018   PFIZER(Purple Top)SARS-COV-2 Vaccination 03/01/2019, 03/21/2019, 10/06/2019   Pfizer Covid-19 Vaccine Bivalent Booster 59yrs & up 11/15/2020  Pneumococcal Conjugate-13 03/18/2013   Pneumococcal Polysaccharide-23 06/30/2008, 10/26/2015   Td 02/05/2005   Tdap 12/14/2010     Objective: Vital Signs: BP 130/78 (BP Location: Left Arm, Patient Position: Sitting, Cuff Size: Large)    Pulse 78    Ht $R'5\' 10"'CG$  (1.778 m)    Wt 250 lb (113.4 kg)    BMI 35.87 kg/m    Physical Exam Vitals and nursing note reviewed.  Constitutional:      Appearance: She is well-developed.  HENT:     Head: Normocephalic and atraumatic.  Eyes:     Conjunctiva/sclera: Conjunctivae normal.  Cardiovascular:     Rate and Rhythm: Normal rate and regular rhythm.     Heart sounds: Normal heart sounds.  Pulmonary:     Effort: Pulmonary effort is normal.     Breath sounds: Normal breath sounds.  Abdominal:     General: Bowel sounds are normal.     Palpations: Abdomen is soft.  Musculoskeletal:     Cervical back: Normal range of motion.  Skin:     General: Skin is warm and dry.     Capillary Refill: Capillary refill takes less than 2 seconds.  Neurological:     Mental Status: She is alert and oriented to person, place, and time.  Psychiatric:        Behavior: Behavior normal.     Musculoskeletal Exam: C-spine, thoracic spine, lumbar spine have good range of motion.  Shoulder joints, elbow joints, wrist joints, MCPs, PIPs, DIPs have good range of motion with no synovitis.  Complete fist formation bilaterally.  PIP and DIP thickening consistent with osteoarthritis of both hands.  Left hip replacement has good range of motion.  Right hip has good range of motion.  Both knee replacements have good range of motion.  Warmth of the left knee replacement noted.  Lymphedema noted in bilateral lower extremities, left > right.  No calf tenderness noted.  CDAI Exam: CDAI Score: -- Patient Global: --; Provider Global: -- Swollen: --; Tender: -- Joint Exam 02/03/2021   No joint exam has been documented for this visit   There is currently no information documented on the homunculus. Go to the Rheumatology activity and complete the homunculus joint exam.  Investigation: No additional findings.  Imaging: No results found.  Recent Labs: Lab Results  Component Value Date   WBC 10.7 (H) 11/26/2020   HGB 10.8 (L) 11/26/2020   PLT 232 11/26/2020   NA 138 11/26/2020   K 4.0 11/26/2020   CL 109 11/26/2020   CO2 23 11/26/2020   GLUCOSE 148 (H) 11/26/2020   BUN 16 11/26/2020   CREATININE 0.59 11/26/2020   BILITOT 0.7 09/13/2020   ALKPHOS 81 09/13/2020   AST 13 09/13/2020   ALT 9 09/13/2020   PROT 7.0 09/13/2020   ALBUMIN 4.1 09/13/2020   CALCIUM 8.6 (L) 11/26/2020   GFRAA 98 07/26/2020    Speciality Comments: PLQ eye exam: 11/10/2020 WNL @ [facility]. Follow up in 6 months.  Procedures:  No procedures performed Allergies: Penicillins     Assessment / Plan:     Visit Diagnoses: Autoimmune disease ([place]) -  +ANA, +RNP:  She has not had any signs or symptoms of active disease.  She has clinically been doing well taking Plaquenil 200 mg 1 tablet by mouth twice daily.  She continues to tolerate Plaquenil without any side effects and has not missed any doses recently.  She has been experiencing increased arthralgias and joint stiffness but had  no synovitis on examination today.  ESR and hydroxychloroquine blood level will be updated today.  Her joint pain seems to be due to underlying osteoarthritis and previous joint damage.   She has not had any recent rashes, increased photosensitivity, oral or nasal ulcerations, dry eyes, cervical lymphadenopathy, shortness of breath, palpitations, pleuritic chest pain, or recent fevers.  She has some chronic dry mouth but has not had any other new or worsening symptoms.  Lab work from 07/26/2020 was reviewed today in the office: Double-stranded DNA negative, complements within normal limits, and ESR within normal limits.  Lab work was reviewed today in detail with the patient and all questions were addressed.  The following lab work will be updated today. She will remain on Plaquenil as prescribed.  She does not require further immunosuppression at this time.  She was advised to notify us if she develops signs or symptoms of a flare.  She will follow-up in the office in 5 months. - Plan: Protein / creatinine ratio, urine, COMPLETE METABOLIC PANEL WITH GFR, CBC with Differential/Platelet, ANA, C3 and C4, Anti-DNA antibody, double-stranded, Sedimentation rate, RNP Antibody, VITAMIN D 25 Hydroxy (Vit-D Deficiency, Fractures), Hydroxychloroquine, Blood  High risk medication use - Plaquenil 200 mg 1 tablet by mouth twice daily.  PLQ eye exam: 11/10/2020 WNL @ [facility]. Follow up in 6 months.  CBC and BMP drawn on 11/26/2020.  CBC and CMP will be updated today.  - Plan: COMPLETE METABOLIC PANEL WITH GFR, CBC with Differential/Platelet, Hydroxychloroquine, Blood  Chronic left shoulder  pain: Unchanged.  She has painful and limited active abduction and forward flexion of the left shoulder.discussed the importance of performing range of motion and strengthening exercises.  Primary osteoarthritis of both hands: She has PIP and DIP thickening consistent with osteoarthritis of both hands.  Subluxation of several PIP and DIP joints noted.  She was able to make a complete fist bilaterally.  Discussed the importance of joint protection and muscle strengthening.  She was given a handout of hand exercises to perform.  She was also given a jar gripper for assistance.   S/P total left hip arthroplasty - Revision 04/01/2020 by Dr. Ninfa Linden.  She experiences occasional discomfort in the left groin.  H/O total knee replacement, bilateral: S/p revision of left knee replacement on 11/25/20.  She uses a cane to assist with ambulation.  DDD (degenerative disc disease), cervical - Dr. Nitka-Chronic pain.  She has painful and limited range of motion of the C-spine especially with lateral rotation.  She has noticed increased crepitus.  She was evaluated by Dr. Louanne Skye in the past and had an MRI of the C-spine.  She completed physical therapy which improved her symptoms slightly.  There was discussion of proceeding with surgery but she declined at that time.  DDD (degenerative disc disease), lumbar: Chronic pain.  Some midline spinal tenderness noted.  No symptoms of radiculopathy at this time.   Carpal tunnel syndrome, right upper limb -She has been experiencing increased paresthesias and pain in the right hand.  She has moderate right median nerve entrapment at wrist revealed on NCV with EMG on 01/05/2020.  She was given a prescription for a right carpal tunnel night splint.  She was advised to schedule appointment in Ortho care to discuss surgical release if her symptoms persist or worsen.  Vitamin D deficiency - She is taking vitamin D 2000 units daily.  Vitamin D level will be rechecked today.  Plan:  VITAMIN D 25 Hydroxy (Vit-D Deficiency, Fractures)  Lymphedema: She was given a new prescription for custom compression stockings.  Other medical conditions are listed as follows:  History of macular degeneration  History of depression  History of sleep apnea  Pedal edema    Orders: Orders Placed This Encounter  Procedures   Protein / creatinine ratio, urine   COMPLETE METABOLIC PANEL WITH GFR   CBC with Differential/Platelet   ANA   C3 and C4   Anti-DNA antibody, double-stranded   Sedimentation rate   RNP Antibody   VITAMIN D 25 Hydroxy (Vit-D Deficiency, Fractures)   Hydroxychloroquine, Blood   No orders of the defined types were placed in this encounter.    Follow-Up Instructions: Return in about 5 months (around 07/04/2021) for Autoimmune Disease, Osteoarthritis, DDD.   Ofilia Neas, PA-C  Note - This record has been created using Dragon software.  Chart creation errors have been sought, but may not always  have been located. Such creation errors do not reflect on  the standard of medical care.

## 2021-01-24 ENCOUNTER — Other Ambulatory Visit: Payer: Self-pay

## 2021-01-24 ENCOUNTER — Telehealth: Payer: Self-pay | Admitting: Adult Health

## 2021-01-24 DIAGNOSIS — M359 Systemic involvement of connective tissue, unspecified: Secondary | ICD-10-CM

## 2021-01-24 MED ORDER — HYDROXYCHLOROQUINE SULFATE 200 MG PO TABS: 200.0000 mg | ORAL_TABLET | Freq: Two times a day (BID) | ORAL | 0 refills | Status: DC

## 2021-01-24 NOTE — Telephone Encounter (Signed)
Patient called requesting prescription refill of Plaquenil to be sent to [facility] at [address]

## 2021-01-24 NOTE — Telephone Encounter (Signed)
Left message for patient to call back and schedule Medicare Annual Wellness Visit (AWV) either virtually or in office. Left  my Herbie Drape number [phone]   Last AWV ;03/18/12  please schedule at anytime with [facility] Nurse Health Advisor 1 or 2   This should be a 45 minute visit.

## 2021-01-24 NOTE — Telephone Encounter (Signed)
Next Visit: 02/03/2021  Last Visit: 07/26/2020  Labs: 11/26/2020 WBC 10.7, RBC 3.67, Hgb 10.8, Hct. 34.2,  Glucose 148, Calcium 8.6  Eye exam: 11/10/2020 WNL   Current Dose per office note 07/26/2020: Plaquenil 200 mg 1 tablet by mouth twice daily.  BU:[id number] disease   Last Fill: 10/24/2020  Okay to refill Plaquenil?

## 2021-02-02 ENCOUNTER — Telehealth: Payer: Self-pay

## 2021-02-02 NOTE — Telephone Encounter (Signed)
Unsuccessful attempt to reach patient for scheduled AWV on preferred number listed in notes. Left message on voicemail okay to reschedule.

## 2021-02-03 ENCOUNTER — Other Ambulatory Visit: Payer: Self-pay

## 2021-02-03 ENCOUNTER — Ambulatory Visit (INDEPENDENT_AMBULATORY_CARE_PROVIDER_SITE_OTHER): Payer: Medicare HMO | Admitting: Physician Assistant

## 2021-02-03 ENCOUNTER — Encounter: Payer: Self-pay | Admitting: Physician Assistant

## 2021-02-03 VITALS — BP 130/78 | HR 78 | Ht 70.0 in | Wt 250.0 lb

## 2021-02-03 DIAGNOSIS — Z96653 Presence of artificial knee joint, bilateral: Secondary | ICD-10-CM

## 2021-02-03 DIAGNOSIS — R600 Localized edema: Secondary | ICD-10-CM

## 2021-02-03 DIAGNOSIS — M19041 Primary osteoarthritis, right hand: Secondary | ICD-10-CM | POA: Diagnosis not present

## 2021-02-03 DIAGNOSIS — E559 Vitamin D deficiency, unspecified: Secondary | ICD-10-CM

## 2021-02-03 DIAGNOSIS — M19042 Primary osteoarthritis, left hand: Secondary | ICD-10-CM

## 2021-02-03 DIAGNOSIS — M359 Systemic involvement of connective tissue, unspecified: Secondary | ICD-10-CM | POA: Diagnosis not present

## 2021-02-03 DIAGNOSIS — M5136 Other intervertebral disc degeneration, lumbar region: Secondary | ICD-10-CM | POA: Diagnosis not present

## 2021-02-03 DIAGNOSIS — Z8669 Personal history of other diseases of the nervous system and sense organs: Secondary | ICD-10-CM

## 2021-02-03 DIAGNOSIS — Z96642 Presence of left artificial hip joint: Secondary | ICD-10-CM | POA: Diagnosis not present

## 2021-02-03 DIAGNOSIS — M5030 Other cervical disc degeneration, unspecified cervical region: Secondary | ICD-10-CM | POA: Diagnosis not present

## 2021-02-03 DIAGNOSIS — M25512 Pain in left shoulder: Secondary | ICD-10-CM | POA: Diagnosis not present

## 2021-02-03 DIAGNOSIS — G5601 Carpal tunnel syndrome, right upper limb: Secondary | ICD-10-CM

## 2021-02-03 DIAGNOSIS — Z79899 Other long term (current) drug therapy: Secondary | ICD-10-CM | POA: Diagnosis not present

## 2021-02-03 DIAGNOSIS — Z8659 Personal history of other mental and behavioral disorders: Secondary | ICD-10-CM

## 2021-02-03 DIAGNOSIS — I890 Lymphedema, not elsewhere classified: Secondary | ICD-10-CM

## 2021-02-03 DIAGNOSIS — G8929 Other chronic pain: Secondary | ICD-10-CM

## 2021-02-03 NOTE — Patient Instructions (Signed)
Hand Exercises Hand exercises can be helpful for almost anyone. These exercises can strengthen the hands, improve flexibility and movement, and increase blood flow to the hands. These results can make work and daily tasks easier. Hand exercises can be especially helpful for people who have joint pain from arthritis or have nerve damage from overuse (carpal tunnel syndrome). These exercises can also help people who have injured a hand. Exercises Most of these hand exercises are gentle stretching and motion exercises. It is usually safe to do them often throughout the day. Warming up your hands before exercise may help to reduce stiffness. You can do this with gentle massage or by placing your hands in warm water for 10-15 minutes. It is normal to feel some stretching, pulling, tightness, or mild discomfort as you begin new exercises. This will gradually improve. Stop an exercise right away if you feel sudden, severe pain or your pain gets worse. Ask your health care provider which exercises are best for you. Knuckle bend or "claw" fist  Stand or sit with your arm, hand, and all five fingers pointed straight up. Make sure to keep your wrist straight during the exercise. Gently bend your fingers down toward your palm until the tips of your fingers are touching the top of your palm. Keep your big knuckle straight and just bend the small knuckles in your fingers. Hold this position for __________ seconds. Straighten (extend) your fingers back to the starting position. Repeat this exercise 5-10 times with each hand. Full finger fist  Stand or sit with your arm, hand, and all five fingers pointed straight up. Make sure to keep your wrist straight during the exercise. Gently bend your fingers into your palm until the tips of your fingers are touching the middle of your palm. Hold this position for __________ seconds. Extend your fingers back to the starting position, stretching every joint fully. Repeat  this exercise 5-10 times with each hand. Straight fist Stand or sit with your arm, hand, and all five fingers pointed straight up. Make sure to keep your wrist straight during the exercise. Gently bend your fingers at the big knuckle, where your fingers meet your hand, and the middle knuckle. Keep the knuckle at the tips of your fingers straight and try to touch the bottom of your palm. Hold this position for __________ seconds. Extend your fingers back to the starting position, stretching every joint fully. Repeat this exercise 5-10 times with each hand. Tabletop  Stand or sit with your arm, hand, and all five fingers pointed straight up. Make sure to keep your wrist straight during the exercise. Gently bend your fingers at the big knuckle, where your fingers meet your hand, as far down as you can while keeping the small knuckles in your fingers straight. Think of forming a tabletop with your fingers. Hold this position for __________ seconds. Extend your fingers back to the starting position, stretching every joint fully. Repeat this exercise 5-10 times with each hand. Finger spread  Place your hand flat on a table with your palm facing down. Make sure your wrist stays straight as you do this exercise. Spread your fingers and thumb apart from each other as far as you can until you feel a gentle stretch. Hold this position for __________ seconds. Bring your fingers and thumb tight together again. Hold this position for __________ seconds. Repeat this exercise 5-10 times with each hand. Making circles  Stand or sit with your arm, hand, and all five fingers pointed   straight up. Make sure to keep your wrist straight during the exercise. Make a circle by touching the tip of your thumb to the tip of your index finger. Hold for __________ seconds. Then open your hand wide. Repeat this motion with your thumb and each finger on your hand. Repeat this exercise 5-10 times with each hand. Thumb  motion  Sit with your forearm resting on a table and your wrist straight. Your thumb should be facing up toward the ceiling. Keep your fingers relaxed as you move your thumb. Lift your thumb up as high as you can toward the ceiling. Hold for __________ seconds. Bend your thumb across your palm as far as you can, reaching the tip of your thumb for the small finger (pinkie) side of your palm. Hold for __________ seconds. Repeat this exercise 5-10 times with each hand. Grip strengthening  Hold a stress ball or other soft ball in the middle of your hand. Slowly increase the pressure, squeezing the ball as much as you can without causing pain. Think of bringing the tips of your fingers into the middle of your palm. All of your finger joints should bend when doing this exercise. Hold your squeeze for __________ seconds, then relax. Repeat this exercise 5-10 times with each hand. Contact a health care provider if: Your hand pain or discomfort gets much worse when you do an exercise. Your hand pain or discomfort does not improve within 2 hours after you exercise. If you have any of these problems, stop doing these exercises right away. Do not do them again unless your health care provider says that you can. Get help right away if: You develop sudden, severe hand pain or swelling. If this happens, stop doing these exercises right away. Do not do them again unless your health care provider says that you can. This information is not intended to replace advice given to you by your health care provider. Make sure you discuss any questions you have with your health care provider. Document Revised: 05/12/2020 Document Reviewed: 05/12/2020 Elsevier Patient Education  2022 [employer]  

## 2021-02-07 ENCOUNTER — Other Ambulatory Visit: Payer: Self-pay

## 2021-02-07 ENCOUNTER — Telehealth: Payer: Self-pay | Admitting: Orthopaedic Surgery

## 2021-02-07 DIAGNOSIS — Z96652 Presence of left artificial knee joint: Secondary | ICD-10-CM

## 2021-02-07 NOTE — Telephone Encounter (Signed)
Patient called advised her left leg and foot is swelling and she is concerned about how much it is swelling. Patient wanted Dr. Ninfa Linden to know in case she needed to be seen sooner. The number to contact patient is [phone]

## 2021-02-07 NOTE — Progress Notes (Signed)
CBC and CMP WNL.  ESR WNL.  Complements WNL.  Protein creatinine ratio is WNL.  ANA remains positive but low titer.  Vitamin D is WNL-32.  Please advise the patient to continue taking a maintenance dose of vitamin D.

## 2021-02-08 ENCOUNTER — Ambulatory Visit (HOSPITAL_COMMUNITY)
Admission: RE | Admit: 2021-02-08 | Discharge: 2021-02-08 | Disposition: A | Discharge status: Home / Self Care | Payer: Medicare HMO | Source: Ambulatory Visit | Attending: Orthopaedic Surgery | Admitting: Orthopaedic Surgery

## 2021-02-08 ENCOUNTER — Other Ambulatory Visit: Payer: Self-pay

## 2021-02-08 DIAGNOSIS — Z96652 Presence of left artificial knee joint: Secondary | ICD-10-CM | POA: Insufficient documentation

## 2021-02-08 NOTE — Progress Notes (Signed)
Lower extremity venous has been completed.   Preliminary results in CV Proc.   Taylor Mccall 02/08/2021 2:31 PM

## 2021-02-12 DIAGNOSIS — G4733 Obstructive sleep apnea (adult) (pediatric): Secondary | ICD-10-CM | POA: Diagnosis not present

## 2021-02-15 ENCOUNTER — Ambulatory Visit: Payer: Medicare HMO | Admitting: Orthopaedic Surgery

## 2021-02-15 ENCOUNTER — Ambulatory Visit (INDEPENDENT_AMBULATORY_CARE_PROVIDER_SITE_OTHER): Payer: Medicare HMO | Admitting: Orthopaedic Surgery

## 2021-02-15 ENCOUNTER — Encounter: Payer: Self-pay | Admitting: Orthopaedic Surgery

## 2021-02-15 ENCOUNTER — Ambulatory Visit (INDEPENDENT_AMBULATORY_CARE_PROVIDER_SITE_OTHER): Payer: Medicare HMO

## 2021-02-15 DIAGNOSIS — Z96652 Presence of left artificial knee joint: Secondary | ICD-10-CM

## 2021-02-15 LAB — C3 AND C4
C3 Complement: 123 mg/dL (ref 83–193)
C4 Complement: 30 mg/dL (ref 15–57)

## 2021-02-15 LAB — CBC WITH DIFFERENTIAL/PLATELET
Absolute Monocytes: 335 cells/uL (ref 200–950)
Basophils Absolute: 70 cells/uL (ref 0–200)
Basophils Relative: 1.8 %
Eosinophils Absolute: 273 cells/uL (ref 15–500)
Eosinophils Relative: 7 %
HCT: 38 % (ref 35.0–45.0)
Hemoglobin: 12 g/dL (ref 11.7–15.5)
Lymphs Abs: 1154 cells/uL (ref 850–3900)
MCH: 28.2 pg (ref 27.0–33.0)
MCHC: 31.6 g/dL — ABNORMAL LOW (ref 32.0–36.0)
MCV: 89.4 fL (ref 80.0–100.0)
MPV: 11 fL (ref 7.5–12.5)
Monocytes Relative: 8.6 %
Neutro Abs: 2067 cells/uL (ref 1500–7800)
Neutrophils Relative %: 53 %
Platelets: 301 10*3/uL (ref 140–400)
RBC: 4.25 10*6/uL (ref 3.80–5.10)
RDW: 12.2 % (ref 11.0–15.0)
Total Lymphocyte: 29.6 %
WBC: 3.9 10*3/uL (ref 3.8–10.8)

## 2021-02-15 LAB — COMPLETE METABOLIC PANEL WITH GFR
AG Ratio: 1.4 (calc) (ref 1.0–2.5)
ALT: 7 U/L (ref 6–29)
AST: 13 U/L (ref 10–35)
Albumin: 4 g/dL (ref 3.6–5.1)
Alkaline phosphatase (APISO): 129 U/L (ref 37–153)
BUN: 14 mg/dL (ref 7–25)
CO2: 28 mmol/L (ref 20–32)
Calcium: 9.8 mg/dL (ref 8.6–10.4)
Chloride: 106 mmol/L (ref 98–110)
Creat: 0.75 mg/dL (ref 0.60–1.00)
Globulin: 2.8 g/dL (calc) (ref 1.9–3.7)
Glucose, Bld: 81 mg/dL (ref 65–99)
Potassium: 4.1 mmol/L (ref 3.5–5.3)
Sodium: 142 mmol/L (ref 135–146)
Total Bilirubin: 0.6 mg/dL (ref 0.2–1.2)
Total Protein: 6.8 g/dL (ref 6.1–8.1)
eGFR: 82 mL/min/{1.73_m2} (ref >=60)

## 2021-02-15 LAB — RNP ANTIBODY: Ribonucleic Protein(ENA) Antibody, IgG: >8 AI — AB

## 2021-02-15 LAB — PROTEIN / CREATININE RATIO, URINE
Creatinine, Urine: 189 mg/dL (ref 20–275)
Protein/Creat Ratio: 74 mg/g creat (ref 24–184)
Protein/Creatinine Ratio: 0.074 mg/mg creat (ref 0.024–0.184)
Total Protein, Urine: 14 mg/dL (ref 5–24)

## 2021-02-15 LAB — HYDROXYCHLOROQUINE,BLOOD: HYDROXYCHLOROQUINE, (B): 700 ng/mL — ABNORMAL HIGH

## 2021-02-15 LAB — ANTI-NUCLEAR AB-TITER (ANA TITER): ANA Titer 1: 1:80 {titer} — ABNORMAL HIGH

## 2021-02-15 LAB — VITAMIN D 25 HYDROXY (VIT D DEFICIENCY, FRACTURES): Vit D, 25-Hydroxy: 32 ng/mL (ref 30–100)

## 2021-02-15 LAB — SEDIMENTATION RATE: Sed Rate: 2 mm/h (ref 0–30)

## 2021-02-15 LAB — ANA: Anti Nuclear Antibody (ANA): POSITIVE — AB

## 2021-02-15 LAB — ANTI-DNA ANTIBODY, DOUBLE-STRANDED: ds DNA Ab: 1 IU/mL

## 2021-02-15 NOTE — Progress Notes (Signed)
Double-stranded DNA is negative.  RNP and ANA remain positive.  Hydroxychloroquine level slightly low: 700.  Please make sure the patient is remaining compliant taking Plaquenil as prescribed.

## 2021-02-15 NOTE — Progress Notes (Signed)
HPI: Mrs. Bertholf returns today follow-up of her left total knee revision which was performed on 11/25/2020.  Patient's had some swelling in her left leg another provider sent her for Doppler on 02/08/2021 due to the swelling.  This is read as negative for DVT.  She does have a history of lymphedema she does wear long thigh-high compression hose.  She notes overall that her left knee is doing well.  She does have some discomfort in the right knee that is developed since undergoing revision of the left knee.  No known injury to either knee since surgery.  Review of systems: See HPI otherwise negative or noncontributory.  Physical exam: General: Well-developed well-nourished female no acute distress Bilateral knees: Excellent range of motion both knees no instability valgus varus stressing.  No abnormal warmth erythema or effusion of either knee.  Normal postoperative edema left knee.  Calf supple nontender.  Radiographs: AP lateral view left knee: Knee is well located.  No bony abnormalities status post left total knee revision with well-seated components.  No acute fractures noted.  Right knee is seen on the AP views and shows well-seated right total knee arthroplasty without any failure components.  Impression: Status post left total knee arthroplasty 11/25/2020  Plan: Recommend she continue to work on range of motion strengthening both knees.  Continue with compression hose to help with swelling.  Scar tissue mobilization encouraged.  Follow-up with [place] in 6 months sooner if there is any questions concerns.

## 2021-02-18 ENCOUNTER — Encounter: Payer: Self-pay | Admitting: Adult Health

## 2021-02-21 NOTE — Telephone Encounter (Signed)
Noted! Will route to the Wellness nurse.

## 2021-03-22 DIAGNOSIS — Z1231 Encounter for screening mammogram for malignant neoplasm of breast: Secondary | ICD-10-CM | POA: Diagnosis not present

## 2021-03-22 DIAGNOSIS — Z780 Asymptomatic menopausal state: Secondary | ICD-10-CM | POA: Diagnosis not present

## 2021-03-22 LAB — HM DEXA SCAN: HM Dexa Scan: NORMAL

## 2021-03-23 LAB — HM MAMMOGRAPHY

## 2021-03-24 ENCOUNTER — Encounter: Payer: Self-pay | Admitting: Adult Health

## 2021-04-13 ENCOUNTER — Telehealth: Payer: Self-pay | Admitting: Adult Health

## 2021-04-13 NOTE — Telephone Encounter (Signed)
Left message for patient to call back and schedule Medicare Annual Wellness Visit (AWV) either virtually or in office. Left  my Herbie Drape number [phone] ? ? ?Last AWV ;03/18/12 ? please schedule at anytime with [facility] Nurse Health Advisor 1 or 2 ? ? ?This should be a 45 minute visit.  ?

## 2021-04-24 ENCOUNTER — Ambulatory Visit (INDEPENDENT_AMBULATORY_CARE_PROVIDER_SITE_OTHER): Payer: Medicare HMO

## 2021-04-24 ENCOUNTER — Telehealth: Payer: Self-pay | Admitting: *Deleted

## 2021-04-24 VITALS — Ht 69.5 in | Wt 250.0 lb

## 2021-04-24 DIAGNOSIS — Z0000 Encounter for general adult medical examination without abnormal findings: Secondary | ICD-10-CM | POA: Diagnosis not present

## 2021-04-24 NOTE — Patient Instructions (Signed)
Taylor Mccall , ?Thank you for taking time to come for your Medicare Wellness Visit. I appreciate your ongoing commitment to your health goals. Please review the following plan we discussed and let me know if I can assist you in the future.  ? ?Screening recommendations/referrals: ?Colonoscopy: not required ?Mammogram: completed 03/23/2021, due 03/24/2022 ?Bone Density: completed 03/22/2021 ?Recommended yearly ophthalmology/optometry visit for glaucoma screening and checkup ?Recommended yearly dental visit for hygiene and checkup ? ?Vaccinations: ?Influenza vaccine: completed 11/15/2020, due next flu season ?Pneumococcal vaccine: completed 10/26/2015 ?Tdap vaccine: due ?Shingles vaccine: discussed   ?Covid-19: 11/15/2020, 10/05/2019, 03/21/2019, 03/01/2019 ? ?Advanced directives: Please bring a copy of your POA (Power of Attorney) and/or Living Will to your next appointment.  ? ?Conditions/risks identified: none ? ?Next appointment: Follow up in one year for your annual wellness visit  ? ? ?Preventive Care 50 Years and Older, Female ?Preventive care refers to lifestyle choices and visits with your health care provider that can promote health and wellness. ?What does preventive care include? ?A yearly physical exam. This is also called an annual well check. ?Dental exams once or twice a year. ?Routine eye exams. Ask your health care provider how often you should have your eyes checked. ?Personal lifestyle choices, including: ?Daily care of your teeth and gums. ?Regular physical activity. ?Eating a healthy diet. ?Avoiding tobacco and drug use. ?Limiting alcohol use. ?Practicing safe sex. ?Taking low-dose aspirin every day. ?Taking vitamin and mineral supplements as recommended by your health care provider. ?What happens during an annual well check? ?The services and screenings done by your health care provider during your annual well check will depend on your age, overall health, lifestyle risk factors, and family history of  disease. ?Counseling  ?Your health care provider may ask you questions about your: ?Alcohol use. ?Tobacco use. ?Drug use. ?Emotional well-being. ?Home and relationship well-being. ?Sexual activity. ?Eating habits. ?History of falls. ?Memory and ability to understand (cognition). ?Work and work Statistician. ?Reproductive health. ?Screening  ?You may have the following tests or measurements: ?Height, weight, and BMI. ?Blood pressure. ?Lipid and cholesterol levels. These may be checked every 5 years, or more frequently if you are over 23 years old. ?Skin check. ?Lung cancer screening. You may have this screening every year starting at age 23 if you have a 30-pack-year history of smoking and currently smoke or have quit within the past 15 years. ?Fecal occult blood test (FOBT) of the stool. You may have this test every year starting at age 68. ?Flexible sigmoidoscopy or colonoscopy. You may have a sigmoidoscopy every 5 years or a colonoscopy every 10 years starting at age 49. ?Hepatitis C blood test. ?Hepatitis B blood test. ?Sexually transmitted disease (STD) testing. ?Diabetes screening. This is done by checking your blood sugar (glucose) after you have not eaten for a while (fasting). You may have this done every 1-3 years. ?Bone density scan. This is done to screen for osteoporosis. You may have this done starting at age 21. ?Mammogram. This may be done every 1-2 years. Talk to your health care provider about how often you should have regular mammograms. ?Talk with your health care provider about your test results, treatment options, and if necessary, the need for more tests. ?Vaccines  ?Your health care provider may recommend certain vaccines, such as: ?Influenza vaccine. This is recommended every year. ?Tetanus, diphtheria, and acellular pertussis (Tdap, Td) vaccine. You may need a Td booster every 10 years. ?Zoster vaccine. You may need this after age 30. ?Pneumococcal 13-valent conjugate (  PCV13) vaccine. One  dose is recommended after age 67. ?Pneumococcal polysaccharide (PPSV23) vaccine. One dose is recommended after age 104. ?Talk to your health care provider about which screenings and vaccines you need and how often you need them. ?This information is not intended to replace advice given to you by your health care provider. Make sure you discuss any questions you have with your health care provider. ?Document Released: 02/18/2015 Document Revised: 10/12/2015 Document Reviewed: 11/23/2014 ?Elsevier Interactive Patient Education ? 2017 [place]. ? ?Fall Prevention in the Home ?Falls can cause injuries. They can happen to people of all ages. There are many things you can do to make your home safe and to help prevent falls. ?What can I do on the outside of my home? ?Regularly fix the edges of walkways and driveways and fix any cracks. ?Remove anything that might make you trip as you walk through a door, such as a raised step or threshold. ?Trim any bushes or trees on the path to your home. ?Use bright outdoor lighting. ?Clear any walking paths of anything that might make someone trip, such as rocks or tools. ?Regularly check to see if handrails are loose or broken. Make sure that both sides of any steps have handrails. ?Any raised decks and porches should have guardrails on the edges. ?Have any leaves, snow, or ice cleared regularly. ?Use sand or salt on walking paths during winter. ?Clean up any spills in your garage right away. This includes oil or grease spills. ?What can I do in the bathroom? ?Use night lights. ?Install grab bars by the toilet and in the tub and shower. Do not use towel bars as grab bars. ?Use non-skid mats or decals in the tub or shower. ?If you need to sit down in the shower, use a plastic, non-slip stool. ?Keep the floor dry. Clean up any water that spills on the floor as soon as it happens. ?Remove soap buildup in the tub or shower regularly. ?Attach bath mats securely with double-sided  non-slip rug tape. ?Do not have throw rugs and other things on the floor that can make you trip. ?What can I do in the bedroom? ?Use night lights. ?Make sure that you have a light by your bed that is easy to reach. ?Do not use any sheets or blankets that are too big for your bed. They should not hang down onto the floor. ?Have a firm chair that has side arms. You can use this for support while you get dressed. ?Do not have throw rugs and other things on the floor that can make you trip. ?What can I do in the kitchen? ?Clean up any spills right away. ?Avoid walking on wet floors. ?Keep items that you use a lot in easy-to-reach places. ?If you need to reach something above you, use a strong step stool that has a grab bar. ?Keep electrical cords out of the way. ?Do not use floor polish or wax that makes floors slippery. If you must use wax, use non-skid floor wax. ?Do not have throw rugs and other things on the floor that can make you trip. ?What can I do with my stairs? ?Do not leave any items on the stairs. ?Make sure that there are handrails on both sides of the stairs and use them. Fix handrails that are broken or loose. Make sure that handrails are as long as the stairways. ?Check any carpeting to make sure that it is firmly attached to the stairs. Fix any carpet that is  loose or worn. ?Avoid having throw rugs at the top or bottom of the stairs. If you do have throw rugs, attach them to the floor with carpet tape. ?Make sure that you have a light switch at the top of the stairs and the bottom of the stairs. If you do not have them, ask someone to add them for you. ?What else can I do to help prevent falls? ?Wear shoes that: ?Do not have high heels. ?Have rubber bottoms. ?Are comfortable and fit you well. ?Are closed at the toe. Do not wear sandals. ?If you use a stepladder: ?Make sure that it is fully opened. Do not climb a closed stepladder. ?Make sure that both sides of the stepladder are locked into place. ?Ask  someone to hold it for you, if possible. ?Clearly mark and make sure that you can see: ?Any grab bars or handrails. ?First and last steps. ?Where the edge of each step is. ?Use tools that help you move around

## 2021-04-24 NOTE — Telephone Encounter (Signed)
? ?  Telephone encounter was:  Successful.  ?04/24/2021 ?Name: Taylor Mccall MRN: [id number] DOB: 1945/02/20 ? ?Taylor Mccall is a 76 y.o. year old female who is a primary care patient of Dorothyann Peng, NP . The community resource team was consulted for assistance with  Home PCP ? ?Care guide performed the following interventions: Patient provided with information about care guide support team and interviewed to confirm resource needs ?Follow up call placed to community resources to determine status of patients referral. ? ?Wants me to send information about Doctors who will make house calls sent via email and patient received while on the phone . ? ?Follow Up Plan:  No further follow up planned at this time. The patient has been provided with needed resources. ?Lovett Sox -Selinda Eon ?Care Guide , Embedded Care Coordination ?[place], Care Management  ?[phone] ?[address] , [place] [address] ?Email : Ashby [email or web address] ?  ? ?

## 2021-04-24 NOTE — Progress Notes (Signed)
?I connected with Taylor Mccall today by telephone and verified that I am speaking with the correct person using two identifiers. ?Location patient: home ?Location provider: work ?Persons participating in the virtual visit: Taylor, Gonia LPN. ?  ?I discussed the limitations, risks, security and privacy concerns of performing an evaluation and management service by telephone and the availability of in person appointments. I also discussed with the patient that there may be a patient responsible charge related to this service. The patient expressed understanding and verbally consented to this telephonic visit.  ?  ?Interactive audio and video telecommunications were attempted between this provider and patient, however failed, due to patient having technical difficulties OR patient did not have access to video capability.  We continued and completed visit with audio only. ? ?  ? ?Vital signs may be patient reported or missing. ? ?Subjective:  ? Taylor Mccall is a 76 y.o. female who presents for Medicare Annual (Subsequent) preventive examination. ? ?Review of Systems    ? ?Cardiac Risk Factors include: advanced age (>18mn, >>19women);hypertension;obesity (BMI >30kg/m2) ? ?   ?Objective:  ?  ?Today's Vitals  ? 04/24/21 0813 04/24/21 0814  ?Weight: 250 lb (113.4 kg)   ?Height: 5' 9.5" (1.765 m)   ?PainSc:  10-Worst pain ever  ? ?Body mass index is 36.39 kg/m?. ? ?Advanced Directives 04/24/2021 11/25/2020 11/18/2020 04/01/2020 03/28/2020 12/02/2019 07/25/2019  ?Does Patient Have a Medical Advance Directive? Yes Yes Yes No Yes Yes No  ?Type of AParamedicof [place] Pl[place]Living will [place] Chup[place]Living will [place]Living will - [place] will [place]Living will -  ?Does patient want to make changes to medical advance directive? - No - Patient declined - - - No - Patient declined -  ?Copy of  [place] Chart? No - copy requested No - copy requested - - - - -  ?Would patient like information on creating a medical advance directive? - - - No - Patient declined - - No - Patient declined  ? ? ?Current Medications (verified) ?Outpatient Encounter Medications as of 04/24/2021  ?Medication Sig  ? ALEVE 220 MG CAPS Take 220-440 mg by mouth 2 (two) times daily as needed (pain.).  ? aspirin EC 81 MG tablet Take 1 tablet (81 mg total) by mouth 2 (two) times daily. Swallow whole. (Patient taking differently: Take 81 mg by mouth daily. Swallow whole.)  ? Biotin w/ Vitamins C & E (HAIR SKIN & NAILS GUMMIES PO) Take 2 tablets by mouth daily.  ? brimonidine (ALPHAGAN) 0.2 % ophthalmic solution Place 1 drop into both eyes 2 (two) times daily.  ? carboxymethylcellulose (REFRESH PLUS) 0.5 % SOLN Place 2 drops into the right eye 4 (four) times daily as needed (for itchy eyes).  ? cholecalciferol (VITAMIN D) 25 MCG (1000 UNIT) tablet Take 1,000 Units by mouth daily.  ? hydroxychloroquine (PLAQUENIL) 200 MG tablet Take 1 tablet (200 mg total) by mouth 2 (two) times daily.  ? KLOR-CON M20 20 MEQ tablet TAKE 2 TABLETS (40 MEQ TOTAL) BY MOUTH EVERY MORNING.  ? latanoprost (XALATAN) 0.005 % ophthalmic solution Place 1 drop into both eyes at bedtime.  ? losartan (COZAAR) 25 MG tablet TAKE 1 TABLET BY MOUTH EVERY DAY  ? Omega-3 Fatty Acids (FISH OIL) 1200 MG CAPS Take 2,400 mg by mouth daily.  ? pantoprazole (PROTONIX) 40 MG tablet TAKE ONE TABLET BY MOUTH ONCE DAILY BEFORE BREAKFAST  ?  sertraline (ZOLOFT) 100 MG tablet TAKE 1 TABLET BY MOUTH EVERY DAY IN THE MORNING  ? vitamin B-12 (CYANOCOBALAMIN) 500 MCG tablet Take 1,000 mcg by mouth daily. Gummy  ? vitamin C (ASCORBIC ACID) 250 MG tablet Take 500 mg by mouth daily. (Gummy)  ? vitamin E 180 MG (400 UNITS) capsule Take 400 Units by mouth daily.  ? furosemide (LASIX) 20 MG tablet Take 1 tablet (20 mg total) by mouth 2 (two) times daily. Morning & Midday  ?  Multiple Vitamins-Minerals (PRESERVISION AREDS 2+MULTI VIT PO) Take 1 capsule by mouth in the morning and at bedtime.  ? oxyCODONE (OXY IR/ROXICODONE) 5 MG immediate release tablet Take 1-2 tablets (5-10 mg total) by mouth every 6 (six) hours as needed for moderate pain (pain score 4-6).  ? rosuvastatin (CRESTOR) 5 MG tablet TAKE 1 TABLET BY MOUTH EVERYDAY AT BEDTIME (Patient not taking: Reported on 04/24/2021)  ? ?No facility-administered encounter medications on file as of 04/24/2021.  ? ? ?Allergies (verified) ?Penicillins  ? ?History: ?Past Medical History:  ?Diagnosis Date  ? Anxiety   ? Blood transfusion without reported diagnosis   ? Breast mass, left   ? Cataract   ? Depression   ? Difficult intubation 04/01/2020  ? Known difficult airway from previous records  ? Gait disturbance   ? GERD (gastroesophageal reflux disease)   ? Hypertension   ? Lymphedema of both lower extremities   ? Seeing OT at [place] to Left Lower leg  ? Obesity   ? Rheumatoid arthritis(714.0)   ? Sleep apnea   ? cpap  ? Urinary incontinence   ? Venous insufficiency   ? ?Past Surgical History:  ?Procedure Laterality Date  ? ABDOMINAL HYSTERECTOMY    ? with vesicovaginal fistula closure  ? ANTERIOR HIP REVISION Left 04/01/2020  ? Procedure: ANTERIOR LEFT HIP REVISION OF ACETABULAR COMPONENT;  Surgeon: Mcarthur Rossetti, MD;  Location: [facility];  Service: Orthopedics;  Laterality: Left;  3E  ? BLADDER SUSPENSION  2009  ? sling  ? BREAST REDUCTION SURGERY    ? COLONOSCOPY    ? ESOPHAGOGASTRODUODENOSCOPY    ? EYE SURGERY    ? REPLACEMENT TOTAL KNEE Left   ? TOE SURGERY Left   ? Left great toe  ? toenail removal Right   ? all 5 toenails  ? TOTAL HIP ARTHROPLASTY    ? TOTAL KNEE ARTHROPLASTY Right 12/09/2013  ? Procedure: Right Total Knee Arthroplasty;  Surgeon: Newt Minion, MD;  Location: [facility];  Service: Orthopedics;  Laterality: Right;  ? TOTAL KNEE REVISION Left 11/25/2020  ? Procedure: LEFT TOTAL KNEE  REVISION;  Surgeon: Mcarthur Rossetti, MD;  Location: [facility];  Service: Orthopedics;  Laterality: Left;  ? ?Family History  ?Problem Relation Age of Onset  ? Diabetes Other   ? Stroke Other   ? Asthma Brother   ? Heart disease Brother   ? Coronary artery disease Father   ? Skin cancer Father   ? Heart disease Father   ? Coronary artery disease Brother   ? Colon cancer Neg Hx   ? Esophageal cancer Neg Hx   ? Rectal cancer Neg Hx   ? Stomach cancer Neg Hx   ? ?Social History  ? ?Socioeconomic History  ? Marital status: Widowed  ?  Spouse name: Not on file  ? Number of children: Not on file  ? Years of education: Not on file  ? Highest education level: Not on file  ?  Occupational History  ? Not on file  ?Tobacco Use  ? Smoking status: Former  ?  Packs/day: 0.30  ?  Years: 10.00  ?  Pack years: 3.00  ?  Types: Cigarettes  ?  Quit date: 02/05/1978  ?  Years since quitting: 43.2  ? Smokeless tobacco: Never  ? Tobacco comments:  ?  over 25 years ago...pt doesnt remember when she quit.   ?Vaping Use  ? Vaping Use: Never used  ?Substance and Sexual Activity  ? Alcohol use: No  ? Drug use: No  ? Sexual activity: Not on file  ?Other Topics Concern  ? Not on file  ?Social History Narrative  ? Not on file  ? ?Social Determinants of Health  ? ?Financial Resource Strain: Low Risk   ? Difficulty of Paying Living Expenses: Not hard at all  ?Food Insecurity: No Food Insecurity  ? Worried About Charity fundraiser in the Last Year: Never true  ? Ran Out of Food in the Last Year: Never true  ?Transportation Needs: No Transportation Needs  ? Lack of Transportation (Medical): No  ? Lack of Transportation (Non-Medical): No  ?Physical Activity: Insufficiently Active  ? Days of Exercise per Week: 3 days  ? Minutes of Exercise per Session: 30 min  ?Stress: No Stress Concern Present  ? Feeling of Stress : Only a little  ?Social Connections: Not on file  ? ? ?Tobacco Counseling ?Counseling given: Not Answered ?Tobacco comments: over 25  years ago...pt doesnt remember when she quit.  ? ? ?Clinical Intake: ? ?Pre-visit preparation completed: Yes ? ?Pain : 0-10 ?Pain Score: 10-Worst pain ever ?Pain Type: Chronic pain ?Pain Location: Generalized ?Pain

## 2021-05-01 ENCOUNTER — Telehealth: Payer: Self-pay | Admitting: *Deleted

## 2021-05-01 NOTE — Telephone Encounter (Signed)
? ?  Telephone encounter was:  Successful.  ?05/01/2021 ?Name: BRYNLEI KLAUSNER MRN: [id number] DOB: 03-07-1945 ? ?CARNISHA FELTZ is a 76 y.o. year old female who is a primary care patient of Dorothyann Peng, NP . The community resource team was consulted for assistance with PCP home visit information provided and received  ? ?Care guide performed the following interventions: Patient provided with information about care guide support team and interviewed to confirm resource needs. ? ?Follow Up Plan:  No further follow up planned at this time. The patient has been provided with needed resources. ? ?Lovett Sox -Selinda Eon ?Care Guide , Embedded Care Coordination ?[place], Care Management  ?[phone] ?[address] , [place] [address] ?Email : [email or web address] [email or web address] ?  ?

## 2021-05-22 ENCOUNTER — Encounter (INDEPENDENT_AMBULATORY_CARE_PROVIDER_SITE_OTHER): Payer: Self-pay

## 2021-05-22 DIAGNOSIS — H401132 Primary open-angle glaucoma, bilateral, moderate stage: Secondary | ICD-10-CM | POA: Diagnosis not present

## 2021-06-16 ENCOUNTER — Other Ambulatory Visit: Payer: Self-pay | Admitting: Adult Health

## 2021-06-16 DIAGNOSIS — I10 Essential (primary) hypertension: Secondary | ICD-10-CM

## 2021-06-16 DIAGNOSIS — Z0000 Encounter for general adult medical examination without abnormal findings: Secondary | ICD-10-CM

## 2021-06-16 DIAGNOSIS — K219 Gastro-esophageal reflux disease without esophagitis: Secondary | ICD-10-CM

## 2021-06-21 NOTE — Progress Notes (Signed)
Office Visit Note  Patient: Taylor Mccall             Date of Birth: Dec 29, 1945           MRN: [id number]             PCP: Dorothyann Peng, NP Referring: Dorothyann Peng, NP Visit Date: 07/05/2021 Occupation: '@GUAROCC'$ @  Subjective:  Medication management  History of Present Illness: Taylor Mccall is a 76 y.o. female with history of autoimmune disease.  She states she continues to have some joint pain and stiffness.  She notices intermittent swelling in her joints.  She denies any history of shortness of breath or palpitations.  She denies any history of oral ulcers or nasal ulcers.  She complains of dry mouth and dry eyes which she relates to medication use.  There is no history of raynaud's phenominon, photosensitivity or lymphadenopathy.  She has been tolerating hydroxychloroquine without any side effects.  Activities of Daily Living:  Patient reports morning stiffness for 30 minutes.   Patient Reports nocturnal pain.  Difficulty dressing/grooming: Reports Difficulty climbing stairs: Reports Difficulty getting out of chair: Reports Difficulty using hands for taps, buttons, cutlery, and/or writing: Reports  Review of Systems  Constitutional:  Positive for fatigue.  HENT:  Positive for mouth dryness. Negative for mouth sores and nose dryness.   Eyes:  Positive for pain, itching and dryness.  Respiratory:  Negative for shortness of breath and difficulty breathing.   Cardiovascular:  Negative for chest pain and palpitations.  Gastrointestinal:  Negative for blood in stool, constipation and diarrhea.  Endocrine: Positive for increased urination.  Genitourinary:  Negative for difficulty urinating.  Musculoskeletal:  Positive for joint pain, joint pain, joint swelling, myalgias, morning stiffness, muscle tenderness and myalgias.  Skin:  Negative for color change, rash, redness and sensitivity to sunlight.  Allergic/Immunologic: Negative for susceptible to infections.   Neurological:  Negative for dizziness, numbness, headaches, memory loss and weakness.  Hematological:  Positive for bruising/bleeding tendency. Negative for swollen glands.  Psychiatric/Behavioral:  Positive for sleep disturbance. Negative for depressed mood and confusion. The patient is not nervous/anxious.    PMFS History:  Patient Active Problem List   Diagnosis Date Noted   Status post revision of total knee replacement, left 11/25/2020   Failed total knee, left, subsequent encounter 11/24/2020   Status post revision of total hip 04/01/2020   Polyethylene liner wear following total hip arthroplasty requiring isolated polyethylene liner exchange ([place]) 03/31/2020   Intermediate stage nonexudative age-related macular degeneration of right eye 12/23/2019   Exudative age-related macular degeneration of left eye with inactive choroidal neovascularization ([place]) 12/23/2019   Primary open angle glaucoma of both eyes, moderate stage 12/23/2019   Exudative retinopathy of left eye 12/23/2019   Routine general medical examination at a health care facility 12/11/2016   Primary osteoarthritis of left hip 05/01/2016   Autoimmune disease ([place]) 11/29/2015   High risk medication use 11/29/2015   Vitamin D deficiency 11/29/2015   Macular degeneration 11/29/2015   Bilateral lower extremity edema 10/26/2015   H/O total knee replacement, bilateral 12/09/2013   Breast mass, left 01/18/2011   Obesity 12/08/2009   Venous (peripheral) insufficiency 07/28/2009   PAIN IN JOINT, ANKLE AND FOOT 01/12/2008   Obstructive sleep apnea 08/29/2007   GAIT DISTURBANCE 08/14/2007   Depression 08/26/2006   Essential hypertension 08/26/2006   Primary osteoarthritis of both hands 08/26/2006   Urinary incontinence 08/26/2006    Past Medical History:  Diagnosis Date  Anxiety    Blood transfusion without reported diagnosis    Breast mass, left    Cataract    Depression    Difficult intubation 04/01/2020   Known  difficult airway from previous records   Gait disturbance    GERD (gastroesophageal reflux disease)    Hypertension    Lymphedema of both lower extremities    Seeing OT at [facility] to Left Lower leg   Obesity    Rheumatoid arthritis(714.0)    Sleep apnea    cpap   Urinary incontinence    Venous insufficiency     Family History  Problem Relation Age of Onset   Diabetes Other    Stroke Other    Asthma Brother    Heart disease Brother    Coronary artery disease Father    Skin cancer Father    Heart disease Father    Coronary artery disease Brother    Colon cancer Neg Hx    Esophageal cancer Neg Hx    Rectal cancer Neg Hx    Stomach cancer Neg Hx    Past Surgical History:  Procedure Laterality Date   ABDOMINAL HYSTERECTOMY     with vesicovaginal fistula closure   ANTERIOR HIP REVISION Left 04/01/2020   Procedure: ANTERIOR LEFT HIP REVISION OF ACETABULAR COMPONENT;  Surgeon: Mcarthur Rossetti, MD;  Location: [facility];  Service: Orthopedics;  Laterality: Left;  3E   BLADDER SUSPENSION  2009   sling   BREAST REDUCTION SURGERY     COLONOSCOPY     ESOPHAGOGASTRODUODENOSCOPY     EYE SURGERY     REFRACTIVE SURGERY Right 06/26/2021   REPLACEMENT TOTAL KNEE Left    TOE SURGERY Left    Left great toe   toenail removal Right    all 5 toenails   TOTAL HIP ARTHROPLASTY     TOTAL KNEE ARTHROPLASTY Right 12/09/2013   Procedure: Right Total Knee Arthroplasty;  Surgeon: Newt Minion, MD;  Location: [place];  Service: Orthopedics;  Laterality: Right;   TOTAL KNEE REVISION Left 11/25/2020   Procedure: LEFT TOTAL KNEE REVISION;  Surgeon: Mcarthur Rossetti, MD;  Location: [facility];  Service: Orthopedics;  Laterality: Left;   Social History   Social History Narrative   Not on file   Immunization History  Administered Date(s) Administered   Influenza Split 12/14/2010, 12/06/2012   Influenza Whole 02/06/2004, 12/08/2009   Influenza, High Dose  Seasonal PF 10/26/2015, 12/11/2016, 01/06/2018, 10/07/2018, 11/15/2020   Influenza,inj,Quad PF,6+ Mos 10/15/2013, 11/19/2014   Influenza-Unspecified 10/07/2018   PFIZER(Purple Top)SARS-COV-2 Vaccination 03/01/2019, 03/21/2019, 10/06/2019   Pfizer Covid-19 Vaccine Bivalent Booster 53yr & up 11/15/2020   Pneumococcal Conjugate-13 03/18/2013   Pneumococcal Polysaccharide-23 06/30/2008, 10/26/2015   Td 02/05/2005   Tdap 12/14/2010     Objective: Vital Signs: BP (!) 174/72 (BP Location: Left Wrist, Patient Position: Sitting, Cuff Size: Normal)   Pulse 65   Ht '5\' 9"'$  (1.753 m)   Wt 253 lb 9.6 oz (115 kg)   BMI 37.45 kg/m    Physical Exam Vitals and nursing note reviewed.  Constitutional:      Appearance: She is well-developed.  HENT:     Head: Normocephalic and atraumatic.  Eyes:     Conjunctiva/sclera: Conjunctivae normal.  Cardiovascular:     Rate and Rhythm: Normal rate and regular rhythm.     Heart sounds: Normal heart sounds.  Pulmonary:     Effort: Pulmonary effort is normal.     Breath sounds:  Normal breath sounds.  Abdominal:     General: Bowel sounds are normal.     Palpations: Abdomen is soft.  Musculoskeletal:     Cervical back: Normal range of motion.     Right lower leg: Edema present.     Left lower leg: Edema present.  Lymphadenopathy:     Cervical: No cervical adenopathy.  Skin:    General: Skin is warm and dry.     Capillary Refill: Capillary refill takes less than 2 seconds.  Neurological:     Mental Status: She is alert and oriented to person, place, and time.  Psychiatric:        Behavior: Behavior normal.     Musculoskeletal Exam: C-spine was in good range of motion.  Right shoulder joint was in good range of motion.  She had limited range of motion of her left shoulder joint with some discomfort.  Elbow joints, wrist joints, MCPs PIPs and DIPs with good range of motion with no synovitis.  She had bilateral PIP and DIP thickening and subluxation of  some of the DIP joints.  Hip joints were difficult to assess in the sitting position.  She had good range of motion of her knee joints and ankle joints.  No swelling was noted.  CDAI Exam: CDAI Score: -- Patient Global: --; Provider Global: -- Swollen: --; Tender: -- Joint Exam 07/05/2021   No joint exam has been documented for this visit   There is currently no information documented on the homunculus. Go to the Rheumatology activity and complete the homunculus joint exam.  Investigation: No additional findings.  Imaging: No results found.  Recent Labs: Lab Results  Component Value Date   WBC 3.9 02/03/2021   HGB 12.0 02/03/2021   PLT 301 02/03/2021   NA 142 02/03/2021   K 4.1 02/03/2021   CL 106 02/03/2021   CO2 28 02/03/2021   GLUCOSE 81 02/03/2021   BUN 14 02/03/2021   CREATININE 0.75 02/03/2021   BILITOT 0.6 02/03/2021   ALKPHOS 81 09/13/2020   AST 13 02/03/2021   ALT 7 02/03/2021   PROT 6.8 02/03/2021   ALBUMIN 4.1 09/13/2020   CALCIUM 9.8 02/03/2021   GFRAA 98 07/26/2020    Speciality Comments: PLQ eye exam: 05/22/2021 WNL @ [facility]. Follow up in 6 months.  Procedures:  No procedures performed Allergies: Penicillins   Assessment / Plan:     Visit Diagnoses: Autoimmune disease ([place]) - +ANA, +RNP: Patient had no synovitis on examination.  She denies any shortness of breath or palpitations.  She denies any history of oral ulcers, nasal ulcers, raynaud's phenominon, lymphadenopathy.  She continues to have sicca symptoms.  High risk medication use - Plaquenil 200 mg 1 tablet by mouth twice daily. PLQ eye exam: 05/22/2021.  Labs obtained on February 03, 2021 were reviewed.  She had positive ANA and positive RNP.  Complements are normal.  Double-stranded ENA was negative.  Chronic left shoulder pain-she has limited range of motion of her left shoulder joint and chronic discomfort.  Primary osteoarthritis of both hands-she had bilateral PIP and DIP  thickening with DIP subluxation.  S/P total left hip arthroplasty - Revision 04/01/2020 by Dr. Ninfa Linden.  She denies any discomfort.  H/O total knee replacement, bilateral - S/p revision of left knee replacement on 11/25/20.  She continues to have chronic knee joint discomfort.  DDD (degenerative disc disease), cervical - Dr. Louanne Skye.  She has chronic discomfort in her cervical region.  DDD (degenerative disc disease),  lumbar-chronic pain.  Carpal tunnel syndrome, right upper limb - She has moderate right median nerve entrapment at wrist revealed on NCV with EMG on 01/05/2020.  She will still has intermittent tingling.  She does not want to have surgery.  Other medical problems are listed as follows:  History of macular degeneration  History of glaucoma-patient had recent surgery on her right eye.  Vitamin D deficiency  History of sleep apnea  History of depression  Pedal edema  Lymphedema  Orders: Orders Placed This Encounter  Procedures   Protein / creatinine ratio, urine   CBC with Differential/Platelet   COMPLETE METABOLIC PANEL WITH GFR   Anti-DNA antibody, double-stranded   C3 and C4   Sedimentation rate   No orders of the defined types were placed in this encounter.   Follow-Up Instructions: Return in about 5 months (around 12/05/2021) for Autoimmune disease.   Bo Merino, MD  Note - This record has been created using Editor, commissioning.  Chart creation errors have been sought, but may not always  have been located. Such creation errors do not reflect on  the standard of medical care.

## 2021-06-26 HISTORY — PX: REFRACTIVE SURGERY: SHX103

## 2021-06-27 DIAGNOSIS — H401112 Primary open-angle glaucoma, right eye, moderate stage: Secondary | ICD-10-CM | POA: Diagnosis not present

## 2021-07-05 ENCOUNTER — Encounter: Payer: Self-pay | Admitting: Rheumatology

## 2021-07-05 ENCOUNTER — Ambulatory Visit (INDEPENDENT_AMBULATORY_CARE_PROVIDER_SITE_OTHER): Payer: Medicare HMO | Admitting: Rheumatology

## 2021-07-05 VITALS — BP 174/72 | HR 65 | Ht 69.0 in | Wt 253.6 lb

## 2021-07-05 DIAGNOSIS — M359 Systemic involvement of connective tissue, unspecified: Secondary | ICD-10-CM

## 2021-07-05 DIAGNOSIS — G5601 Carpal tunnel syndrome, right upper limb: Secondary | ICD-10-CM | POA: Diagnosis not present

## 2021-07-05 DIAGNOSIS — E559 Vitamin D deficiency, unspecified: Secondary | ICD-10-CM

## 2021-07-05 DIAGNOSIS — M25512 Pain in left shoulder: Secondary | ICD-10-CM | POA: Diagnosis not present

## 2021-07-05 DIAGNOSIS — Z79899 Other long term (current) drug therapy: Secondary | ICD-10-CM | POA: Diagnosis not present

## 2021-07-05 DIAGNOSIS — M5030 Other cervical disc degeneration, unspecified cervical region: Secondary | ICD-10-CM | POA: Diagnosis not present

## 2021-07-05 DIAGNOSIS — Z96653 Presence of artificial knee joint, bilateral: Secondary | ICD-10-CM

## 2021-07-05 DIAGNOSIS — M19041 Primary osteoarthritis, right hand: Secondary | ICD-10-CM | POA: Diagnosis not present

## 2021-07-05 DIAGNOSIS — M5136 Other intervertebral disc degeneration, lumbar region: Secondary | ICD-10-CM | POA: Diagnosis not present

## 2021-07-05 DIAGNOSIS — Z96642 Presence of left artificial hip joint: Secondary | ICD-10-CM | POA: Diagnosis not present

## 2021-07-05 DIAGNOSIS — R600 Localized edema: Secondary | ICD-10-CM

## 2021-07-05 DIAGNOSIS — M19042 Primary osteoarthritis, left hand: Secondary | ICD-10-CM

## 2021-07-05 DIAGNOSIS — Z8659 Personal history of other mental and behavioral disorders: Secondary | ICD-10-CM | POA: Diagnosis not present

## 2021-07-05 DIAGNOSIS — Z8669 Personal history of other diseases of the nervous system and sense organs: Secondary | ICD-10-CM | POA: Diagnosis not present

## 2021-07-05 DIAGNOSIS — G8929 Other chronic pain: Secondary | ICD-10-CM

## 2021-07-05 DIAGNOSIS — I890 Lymphedema, not elsewhere classified: Secondary | ICD-10-CM

## 2021-07-06 LAB — CBC WITH DIFFERENTIAL/PLATELET
Absolute Monocytes: 359 cells/uL (ref 200–950)
Basophils Absolute: 51 cells/uL (ref 0–200)
Basophils Relative: 1.1 %
Eosinophils Absolute: 359 cells/uL (ref 15–500)
Eosinophils Relative: 7.8 %
HCT: 37.8 % (ref 35.0–45.0)
Hemoglobin: 12.5 g/dL (ref 11.7–15.5)
Lymphs Abs: 1431 cells/uL (ref 850–3900)
MCH: 29.3 pg (ref 27.0–33.0)
MCHC: 33.1 g/dL (ref 32.0–36.0)
MCV: 88.7 fL (ref 80.0–100.0)
MPV: 11.6 fL (ref 7.5–12.5)
Monocytes Relative: 7.8 %
Neutro Abs: 2401 cells/uL (ref 1500–7800)
Neutrophils Relative %: 52.2 %
Platelets: 262 10*3/uL (ref 140–400)
RBC: 4.26 10*6/uL (ref 3.80–5.10)
RDW: 12.3 % (ref 11.0–15.0)
Total Lymphocyte: 31.1 %
WBC: 4.6 10*3/uL (ref 3.8–10.8)

## 2021-07-06 LAB — COMPLETE METABOLIC PANEL WITH GFR
AG Ratio: 1.4 (calc) (ref 1.0–2.5)
ALT: 8 U/L (ref 6–29)
AST: 14 U/L (ref 10–35)
Albumin: 4.2 g/dL (ref 3.6–5.1)
Alkaline phosphatase (APISO): 118 U/L (ref 37–153)
BUN: 16 mg/dL (ref 7–25)
CO2: 28 mmol/L (ref 20–32)
Calcium: 10.1 mg/dL (ref 8.6–10.4)
Chloride: 105 mmol/L (ref 98–110)
Creat: 0.85 mg/dL (ref 0.60–1.00)
Globulin: 2.9 g/dL (calc) (ref 1.9–3.7)
Glucose, Bld: 82 mg/dL (ref 65–99)
Potassium: 4.7 mmol/L (ref 3.5–5.3)
Sodium: 141 mmol/L (ref 135–146)
Total Bilirubin: 0.8 mg/dL (ref 0.2–1.2)
Total Protein: 7.1 g/dL (ref 6.1–8.1)
eGFR: 71 mL/min/{1.73_m2} (ref >=60)

## 2021-07-06 LAB — C3 AND C4
C3 Complement: 132 mg/dL (ref 83–193)
C4 Complement: 33 mg/dL (ref 15–57)

## 2021-07-06 LAB — SEDIMENTATION RATE: Sed Rate: 9 mm/h (ref 0–30)

## 2021-07-06 LAB — PROTEIN / CREATININE RATIO, URINE
Creatinine, Urine: 76 mg/dL (ref 20–275)
Protein/Creat Ratio: 66 mg/g creat (ref 24–184)
Protein/Creatinine Ratio: 0.066 mg/mg creat (ref 0.024–0.184)
Total Protein, Urine: 5 mg/dL (ref 5–24)

## 2021-07-06 LAB — ANTI-DNA ANTIBODY, DOUBLE-STRANDED: ds DNA Ab: 1 IU/mL

## 2021-07-24 ENCOUNTER — Encounter: Payer: Self-pay | Admitting: Adult Health

## 2021-07-24 ENCOUNTER — Other Ambulatory Visit: Payer: Self-pay | Admitting: Physician Assistant

## 2021-07-24 DIAGNOSIS — M359 Systemic involvement of connective tissue, unspecified: Secondary | ICD-10-CM

## 2021-07-24 NOTE — Telephone Encounter (Signed)
Next Visit: 12/04/2021  Last Visit: 07/05/2021  Labs: 07/05/2021, CBC and CMP WNL.  ESR WNL.  Protein creatinine ratio WNL.  Complements WNL. dsDNA negative.   Eye exam: 05/22/2021   Current Dose per office note 07/05/2021: Plaquenil 200 mg 1 tablet by mouth twice daily.  DX:  Autoimmune disease   Last Fill: 01/24/2021  Okay to refill Plaquenil?

## 2021-07-25 ENCOUNTER — Encounter: Payer: Self-pay | Admitting: Adult Health

## 2021-07-25 ENCOUNTER — Ambulatory Visit (INDEPENDENT_AMBULATORY_CARE_PROVIDER_SITE_OTHER): Payer: Medicare HMO | Admitting: Adult Health

## 2021-07-25 VITALS — Ht 69.0 in | Wt 250.0 lb

## 2021-07-25 DIAGNOSIS — F419 Anxiety disorder, unspecified: Secondary | ICD-10-CM | POA: Diagnosis not present

## 2021-07-25 DIAGNOSIS — R69 Illness, unspecified: Secondary | ICD-10-CM | POA: Diagnosis not present

## 2021-07-25 MED ORDER — ALPRAZOLAM 0.25 MG PO TABS: 0.2500 mg | ORAL_TABLET | Freq: Two times a day (BID) | ORAL | 0 refills | Status: DC | PRN

## 2021-07-25 NOTE — Progress Notes (Signed)
Virtual Visit via Video Note  I connected with Taylor Mccall on 07/25/21 at 11:30 AM EDT by a video enabled telemedicine application and verified that I am speaking with the correct person using two identifiers.  Location patient: home Location provider:work or home office Persons participating in the virtual visit: patient, provider  I discussed the limitations of evaluation and management by telemedicine and the availability of in person appointments. The patient expressed understanding and agreed to proceed.   HPI: She is being evaluated today for situational anxiety.  She reports that recently when she is riding with friends or family members that she becomes more anxious while they are driving.  She contributes this to vision loss and not being able to judge the distance.  She reports "I become a backseat driver and I think my family wants to gag me when I am writing with them".  She is going on a long car trip up to [place] with her family and is wondering if there is anything that I can prescribe that can help with her anxiety while driving.  She will not be doing any of the driving herself    ROS: See pertinent positives and negatives per HPI.  Past Medical History:  Diagnosis Date   Anxiety    Blood transfusion without reported diagnosis    Breast mass, left    Cataract    Depression    Difficult intubation 04/01/2020   Known difficult airway from previous records   Gait disturbance    GERD (gastroesophageal reflux disease)    Hypertension    Lymphedema of both lower extremities    Seeing OT at [facility] to Left Lower leg   Obesity    Rheumatoid arthritis(714.0)    Sleep apnea    cpap   Urinary incontinence    Venous insufficiency     Past Surgical History:  Procedure Laterality Date   ABDOMINAL HYSTERECTOMY     with vesicovaginal fistula closure   ANTERIOR HIP REVISION Left 04/01/2020   Procedure: ANTERIOR LEFT HIP REVISION OF  ACETABULAR COMPONENT;  Surgeon: Mcarthur Rossetti, MD;  [facility];  Service: Orthopedics;  Laterality: Left;  3E   BLADDER SUSPENSION  2009   sling   BREAST REDUCTION SURGERY     COLONOSCOPY     ESOPHAGOGASTRODUODENOSCOPY     EYE SURGERY     REFRACTIVE SURGERY Right 06/26/2021   REPLACEMENT TOTAL KNEE Left    TOE SURGERY Left    Left great toe   toenail removal Right    all 5 toenails   TOTAL HIP ARTHROPLASTY     TOTAL KNEE ARTHROPLASTY Right 12/09/2013   Procedure: Right Total Knee Arthroplasty;  Surgeon: Newt Minion, MD;  Location: [place];  Service: Orthopedics;  Laterality: Right;   TOTAL KNEE REVISION Left 11/25/2020   Procedure: LEFT TOTAL KNEE REVISION;  Surgeon: Mcarthur Rossetti, MD;  [facility];  Service: Orthopedics;  Laterality: Left;    Family History  Problem Relation Age of Onset   Diabetes Other    Stroke Other    Asthma Brother    Heart disease Brother    Coronary artery disease Father    Skin cancer Father    Heart disease Father    Coronary artery disease Brother    Colon cancer Neg Hx    Esophageal cancer Neg Hx    Rectal cancer Neg Hx    Stomach cancer Neg Hx  Current Outpatient Medications:    ALPRAZolam (XANAX) 0.25 MG tablet, Take 1 tablet (0.25 mg total) by mouth 2 (two) times daily as needed for anxiety., Disp: 20 tablet, Rfl: 0   aspirin EC 81 MG tablet, Take 1 tablet (81 mg total) by mouth 2 (two) times daily. Swallow whole. (Patient taking differently: Take 81 mg by mouth daily. Swallow whole.), Disp: 30 tablet, Rfl: 11   Biotin w/ Vitamins C & E (HAIR SKIN & NAILS GUMMIES PO), Take 2 tablets by mouth daily., Disp: , Rfl:    brimonidine (ALPHAGAN) 0.2 % ophthalmic solution, Place 1 drop into both eyes 2 (two) times daily., Disp: , Rfl:    cholecalciferol (VITAMIN D) 25 MCG (1000 UNIT) tablet, Take 1,000 Units by mouth daily., Disp: , Rfl:    hydroxychloroquine (PLAQUENIL) 200 MG tablet, TAKE 1 TABLET BY MOUTH  TWICE A DAY, Disp: 180 tablet, Rfl: 0   latanoprost (XALATAN) 0.005 % ophthalmic solution, Place 1 drop into both eyes at bedtime., Disp: , Rfl:    losartan (COZAAR) 25 MG tablet, TAKE 1 TABLET BY MOUTH EVERY DAY, Disp: 90 tablet, Rfl: 3   Multiple Vitamins-Minerals (PRESERVISION AREDS 2+MULTI VIT PO), Take 1 capsule by mouth in the morning and at bedtime., Disp: , Rfl:    Omega-3 Fatty Acids (FISH OIL) 1200 MG CAPS, Take 2,400 mg by mouth daily., Disp: , Rfl:    pantoprazole (PROTONIX) 40 MG tablet, TAKE ONE TABLET BY MOUTH ONCE DAILY BEFORE BREAKFAST, Disp: 90 tablet, Rfl: 1   potassium chloride SA (KLOR-CON M20) 20 MEQ tablet, TAKE 2 TABLETS (40 MEQ TOTAL) BY MOUTH EVERY MORNING., Disp: 180 tablet, Rfl: 0   rosuvastatin (CRESTOR) 5 MG tablet, TAKE 1 TABLET BY MOUTH EVERYDAY AT BEDTIME, Disp: 90 tablet, Rfl: 0   sertraline (ZOLOFT) 100 MG tablet, TAKE 1 TABLET BY MOUTH EVERY DAY IN THE MORNING, Disp: 90 tablet, Rfl: 3   timolol (TIMOPTIC) 0.5 % ophthalmic solution, SMARTSIG:In Eye(s), Disp: , Rfl:    vitamin B-12 (CYANOCOBALAMIN) 500 MCG tablet, Take 1,000 mcg by mouth daily. Gummy, Disp: , Rfl:    vitamin C (ASCORBIC ACID) 250 MG tablet, Take 500 mg by mouth daily. (Gummy), Disp: , Rfl:    vitamin E 180 MG (400 UNITS) capsule, Take 400 Units by mouth daily., Disp: , Rfl:    ALEVE 220 MG CAPS, Take 220-440 mg by mouth 2 (two) times daily as needed (pain.). (Patient not taking: Reported on 07/25/2021), Disp: , Rfl:    furosemide (LASIX) 20 MG tablet, Take 1 tablet (20 mg total) by mouth 2 (two) times daily. Morning & Midday, Disp: 180 tablet, Rfl: 3  EXAM:  VITALS per patient if applicable:  GENERAL: alert, oriented, appears well and in no acute distress  HEENT: atraumatic, conjunttiva clear, no obvious abnormalities on inspection of external nose and ears  NECK: normal movements of the head and neck  LUNGS: on inspection no signs of respiratory distress, breathing rate appears normal, no  obvious gross SOB, gasping or wheezing  CV: no obvious cyanosis  MS: moves all visible extremities without noticeable abnormality  PSYCH/NEURO: pleasant and cooperative, no obvious depression or anxiety, speech and thought processing grossly intact  ASSESSMENT AND PLAN:  Discussed the following assessment and plan:  Anxiety - Plan: ALPRAZolam (XANAX) 0.25 MG tablet  - Advised that xanax may be sedating. Only take as needed   I discussed the assessment and treatment plan with the patient. The patient was provided an opportunity to ask  questions and all were answered. The patient agreed with the plan and demonstrated an understanding of the instructions.   The patient was advised to call back or seek an in-person evaluation if the symptoms worsen or if the condition fails to improve as anticipated.   Dorothyann Peng, NP

## 2021-07-25 NOTE — Telephone Encounter (Signed)
Pt has been scheduled.  °

## 2021-08-02 DIAGNOSIS — H401132 Primary open-angle glaucoma, bilateral, moderate stage: Secondary | ICD-10-CM | POA: Diagnosis not present

## 2021-08-13 ENCOUNTER — Other Ambulatory Visit: Payer: Self-pay | Admitting: Adult Health

## 2021-08-13 DIAGNOSIS — Z0000 Encounter for general adult medical examination without abnormal findings: Secondary | ICD-10-CM

## 2021-08-13 DIAGNOSIS — I10 Essential (primary) hypertension: Secondary | ICD-10-CM

## 2021-08-16 ENCOUNTER — Ambulatory Visit (INDEPENDENT_AMBULATORY_CARE_PROVIDER_SITE_OTHER): Payer: Medicare HMO | Admitting: Orthopaedic Surgery

## 2021-08-16 ENCOUNTER — Encounter: Payer: Self-pay | Admitting: Orthopaedic Surgery

## 2021-08-16 DIAGNOSIS — Z96652 Presence of left artificial knee joint: Secondary | ICD-10-CM

## 2021-08-16 DIAGNOSIS — Z96649 Presence of unspecified artificial hip joint: Secondary | ICD-10-CM

## 2021-08-16 NOTE — Progress Notes (Signed)
The patient comes today for follow-up after having a left knee revision arthroplasty for failed total knee arthroplasty.  This was done in October of last year.  She has a history of a left hip revision of a worn-out acetabular liner.  The original hip was done many decades ago.  She also has a history of a right knee replacement.  She does have retained hardware in her right hip from a child from having a slipped capital femoral epiphysis.  She has had some falls recently.  She is able with a cane now.  She is a 76 year old who is tall and obese as well.  Her daughter is with her today.  Both her knees and her hips move smoothly and fluidly.  There is really no significant abnormalities that I can see on clinical exam today at all.  She does have lymphedema in both of her legs and does wear compressive stockings.  From my standpoint follow-up is as needed.  I would hold off on any further surgery on her right hip until he gets bad enough for her because that will be significantly complicated surgery.  If she does have issues with any of her arthroplasties, she knows to come back and see [place].  All questions and concerns were answered and addressed.

## 2021-09-13 ENCOUNTER — Ambulatory Visit: Payer: Self-pay

## 2021-09-13 ENCOUNTER — Telehealth: Payer: Self-pay | Admitting: *Deleted

## 2021-09-13 DIAGNOSIS — T84018D Broken internal joint prosthesis, other site, subsequent encounter: Secondary | ICD-10-CM

## 2021-09-13 NOTE — Patient Instructions (Signed)
Visit Information  Thank you for taking time to visit with me today. Please don't hesitate to contact me if I can be of assistance to you.   Following are the goals we discussed today:   Goals Addressed             This Visit's Progress    COMPLETED: Care Coordination Activities       Care Coordination Interventions: SDoH screening completed - discussed patient needs assistance with transportation to provider appointments. Patient plans to drive self to [facility] scheduled 09/13/21.  Performed chart review to note during last PCP visit on 07/25/21 provider noted vision changes and patient is no longer driving Discussed plan for SW to place a referral to the community resource care guide team to determine if patient is eligible for transportation assistance Fall risk assessment performed to note patient is a moderate fall risk; falls prevention education discussed Noted patient does not have concerns with medication costs or adherence Determined the patient uses a walker in the home and has a life alert which she wears consistently Performed chart review to note patient participated in [place] on 04/24/21 Introduced care coordination team to the patient; no follow up needs identified at this time Instructed the patient to speak with her primary care provider as needed regarding health care needs        Please call the care guide team at [phone] if you need to schedule an appointment with me  If you are experiencing a Mental Health or [place] or need someone to talk to, please call 1-800-273-TALK (toll free, 24 hour hotline)  Patient verbalizes understanding of instructions and care plan provided today and agrees to view in [place]. Active MyChart status and patient understanding of how to access instructions and care plan via MyChart confirmed with patient.     No further follow up required: A community resource care guide team member will contact you to discuss transportation  options.  Daneen Schick, BSW, CDP Social Worker, Certified Dementia Practitioner Care Coordination [phone]

## 2021-09-13 NOTE — Patient Outreach (Signed)
  Care Coordination   Initial Visit Note   09/13/2021 Name: Taylor Mccall MRN: [id number] DOB: 1945/11/29  Taylor Mccall is a 76 y.o. year old female who sees Nafziger, Tommi Rumps, NP for primary care. I spoke with  Hessie Knows by phone today  What matters to the patients health and wellness today?  Transportation to appointment on 8/10    Goals Addressed             This Visit's Progress    COMPLETED: Care Coordination Activities       Care Coordination Interventions: SDoH screening completed - discussed patient needs assistance with transportation to provider appointments. Patient plans to drive self to [facility] scheduled 09/13/21.  Performed chart review to note during last PCP visit on 07/25/21 provider noted vision changes and patient is no longer driving Discussed plan for SW to place a referral to the community resource care guide team to determine if patient is eligible for transportation assistance Fall risk assessment performed to note patient is a moderate fall risk; falls prevention education discussed Noted patient does not have concerns with medication costs or adherence Determined the patient uses a walker in the home and has a life alert which she wears consistently Performed chart review to note patient participated in [place] on 04/24/21 Introduced care coordination team to the patient; no follow up needs identified at this time Instructed the patient to speak with her primary care provider as needed regarding health care needs        SDOH assessments and interventions completed:  Yes  SDOH Interventions Today    Flowsheet Row Most Recent Value  SDOH Interventions   Food Insecurity Interventions Intervention Not Indicated  Housing Interventions Intervention Not Indicated  Transportation Interventions Other (Comment)  [Referral to care guide team]        Care Coordination Interventions Activated:  Yes  Care Coordination Interventions:  Yes, provided    Follow up plan: Referral made to community resource care guide team    Encounter Outcome:  Pt. Visit Completed   Daneen Schick, BSW, CDP Social Worker, Certified Dementia Practitioner Care Coordination [phone]

## 2021-09-13 NOTE — Telephone Encounter (Signed)
Telephone encounter was:  Successful.  09/13/2021 Name: Taylor Mccall MRN: [id number] DOB: [birth year]  Taylor Mccall is a 76 y.o. year old female who is a primary care patient of Dorothyann Peng, NP . The community resource team was consulted for assistance with Transportation Needs  patient needs to sign waiver   Care guide performed the following interventions: Follow up call placed to the patient to discuss status of referral.  Follow Up Plan:  No further follow up planned at this time. The patient has been provided with needed resources.  [place] [phone] [address] , [place] [address] Email : [email or web address]   09/13/2021  Taylor Mccall DOB: 1945/03/22 MRN: [id number]   RIDER WAIVER AND RELEASE OF LIABILITY  For purposes of improving physical access to our facilities, [facility] is pleased to partner with third parties to provide [facility] patients or other authorized individuals the option of convenient, on-demand ground transportation services (the [place]") through use of the technology service that enables users to request on-demand ground transportation from independent third-party providers.  By opting to use and accept these [employer], I, the undersigned, hereby agree on behalf of myself, and on behalf of any minor child using the Government social research officer for whom I am the parent or legal guardian, as follows:  Government social research officer provided to me are provided by independent third-party transportation providers who are not [employer] or employees and who are unaffiliated with [employer]. [facility] is neither a transportation carrier nor a common or public carrier. [facility] has no control over the quality or safety of the transportation that occurs as a result of the [employer]. [facility] cannot guarantee that any third-party  transportation provider will complete any arranged transportation service. [facility] makes no representation, warranty, or guarantee regarding the reliability, timeliness, quality, safety, suitability, or availability of any of the Transport Services or that they will be error free. I fully understand that traveling by vehicle involves risks and dangers of serious bodily injury, including permanent disability, paralysis, and death. I agree, on behalf of myself and on behalf of any minor child using the Transport Services for whom I am the parent or legal guardian, that the entire risk arising out of my use of the [employer] remains solely with me, to the maximum extent permitted under applicable law. The [employer] are provided "as is" and "as available." [facility] disclaims all representations and warranties, express, implied or statutory, not expressly set out in these terms, including the implied warranties of merchantability and fitness for a particular purpose. I hereby waive and release [facility], its agents, employees, officers, directors, representatives, insurers, attorneys, assigns, successors, subsidiaries, and affiliates from any and all past, present, or future claims, demands, liabilities, actions, causes of action, or suits of any kind directly or indirectly arising from acceptance and use of the [employer]. I further waive and release  and its affiliates from all present and future liability and responsibility for any injury or death to persons or damages to property caused by or related to the use of the [employer]. I have read this Waiver and Release of Liability, and I understand the terms used in it and their legal significance. This Waiver is freely and voluntarily given with the understanding that my right (as well as the right of any minor child for whom I am the parent or legal  guardian using the [employer]) to legal recourse  against [facility] in connection with the [employer] is knowingly surrendered in return for use of these services.

## 2021-09-13 NOTE — Telephone Encounter (Signed)
   Telephone encounter was:  Successful.  09/13/2021 Name: CLARENE CURRAN MRN: [id number] DOB: 16-Dec-1945  NEKO MCGEEHAN is a 76 y.o. year old female who is a primary care patient of Dorothyann Peng, NP . The community resource team was consulted for assistance with Transportation Needs  Would like to leave by 8 uses a walker recent hip and knee replacemenet , By phone [phone], will send TAms application and patient does not want to sign waiver without talking with Daughter , so will sign in office  Care guide performed the following interventions: Patient provided with information about care guide support team and interviewed to confirm resource needs.    Follow Up Plan:    [facility] [phone] [address] , [place] [address] Email : [email or web address]

## 2021-09-14 ENCOUNTER — Ambulatory Visit (INDEPENDENT_AMBULATORY_CARE_PROVIDER_SITE_OTHER): Payer: Medicare HMO | Admitting: Adult Health

## 2021-09-14 ENCOUNTER — Other Ambulatory Visit: Payer: Self-pay | Admitting: Adult Health

## 2021-09-14 ENCOUNTER — Encounter: Payer: Self-pay | Admitting: Adult Health

## 2021-09-14 VITALS — BP 122/64 | HR 59 | Temp 98.5°F | Ht 69.5 in | Wt 251.0 lb

## 2021-09-14 DIAGNOSIS — Z1159 Encounter for screening for other viral diseases: Secondary | ICD-10-CM | POA: Diagnosis not present

## 2021-09-14 DIAGNOSIS — G4733 Obstructive sleep apnea (adult) (pediatric): Secondary | ICD-10-CM

## 2021-09-14 DIAGNOSIS — K219 Gastro-esophageal reflux disease without esophagitis: Secondary | ICD-10-CM | POA: Diagnosis not present

## 2021-09-14 DIAGNOSIS — F325 Major depressive disorder, single episode, in full remission: Secondary | ICD-10-CM

## 2021-09-14 DIAGNOSIS — M359 Systemic involvement of connective tissue, unspecified: Secondary | ICD-10-CM | POA: Diagnosis not present

## 2021-09-14 DIAGNOSIS — I10 Essential (primary) hypertension: Secondary | ICD-10-CM

## 2021-09-14 DIAGNOSIS — Z0000 Encounter for general adult medical examination without abnormal findings: Secondary | ICD-10-CM

## 2021-09-14 DIAGNOSIS — M1612 Unilateral primary osteoarthritis, left hip: Secondary | ICD-10-CM | POA: Diagnosis not present

## 2021-09-14 DIAGNOSIS — E785 Hyperlipidemia, unspecified: Secondary | ICD-10-CM | POA: Diagnosis not present

## 2021-09-14 DIAGNOSIS — R69 Illness, unspecified: Secondary | ICD-10-CM | POA: Diagnosis not present

## 2021-09-14 LAB — CBC WITH DIFFERENTIAL/PLATELET
Basophils Absolute: 0.1 10*3/uL (ref 0.0–0.1)
Basophils Relative: 1.9 % (ref 0.0–3.0)
Eosinophils Absolute: 0.4 10*3/uL (ref 0.0–0.7)
Eosinophils Relative: 8.7 % — ABNORMAL HIGH (ref 0.0–5.0)
HCT: 37.3 % (ref 36.0–46.0)
Hemoglobin: 12.5 g/dL (ref 12.0–15.0)
Lymphocytes Relative: 28.4 % (ref 12.0–46.0)
Lymphs Abs: 1.3 10*3/uL (ref 0.7–4.0)
MCHC: 33.4 g/dL (ref 30.0–36.0)
MCV: 88.4 fl (ref 78.0–100.0)
Monocytes Absolute: 0.4 10*3/uL (ref 0.1–1.0)
Monocytes Relative: 9.3 % (ref 3.0–12.0)
Neutro Abs: 2.3 10*3/uL (ref 1.4–7.7)
Neutrophils Relative %: 51.7 % (ref 43.0–77.0)
Platelets: 216 10*3/uL (ref 150.0–400.0)
RBC: 4.23 Mil/uL (ref 3.87–5.11)
RDW: 13 % (ref 11.5–15.5)
WBC: 4.4 10*3/uL (ref 4.0–10.5)

## 2021-09-14 LAB — COMPREHENSIVE METABOLIC PANEL
ALT: 7 U/L (ref 0–35)
AST: 14 U/L (ref 0–37)
Albumin: 4.2 g/dL (ref 3.5–5.2)
Alkaline Phosphatase: 110 U/L (ref 39–117)
BUN: 19 mg/dL (ref 6–23)
CO2: 28 mEq/L (ref 19–32)
Calcium: 9.6 mg/dL (ref 8.4–10.5)
Chloride: 104 mEq/L (ref 96–112)
Creatinine, Ser: 0.84 mg/dL (ref 0.40–1.20)
GFR: 66.78 mL/min (ref >60.00)
Glucose, Bld: 83 mg/dL (ref 70–99)
Potassium: 3.9 mEq/L (ref 3.5–5.1)
Sodium: 142 mEq/L (ref 135–145)
Total Bilirubin: 0.7 mg/dL (ref 0.2–1.2)
Total Protein: 7.1 g/dL (ref 6.0–8.3)

## 2021-09-14 LAB — LIPID PANEL
Cholesterol: 189 mg/dL (ref 0–200)
HDL: 65.8 mg/dL (ref >39.00)
LDL Cholesterol: 110 mg/dL — ABNORMAL HIGH (ref 0–99)
NonHDL: 122.95
Total CHOL/HDL Ratio: 3
Triglycerides: 64 mg/dL (ref 0.0–149.0)
VLDL: 12.8 mg/dL (ref 0.0–40.0)

## 2021-09-14 LAB — TSH: TSH: 2.5 u[IU]/mL (ref 0.35–5.50)

## 2021-09-14 NOTE — Progress Notes (Signed)
Subjective:    Patient ID: Taylor Mccall, female    DOB: [birth year], 76 y.o.   MRN: [id number]  HPI  76 year old female who  has a past medical history of Anxiety, Blood transfusion without reported diagnosis, Breast mass, left, Cataract, Depression, Difficult intubation (04/01/2020), Gait disturbance, GERD (gastroesophageal reflux disease), Hypertension, Lymphedema of both lower extremities, Obesity, Rheumatoid arthritis(714.0), Sleep apnea, Urinary incontinence, and Venous insufficiency.  Essential Hypertension -managed with Lasix 20 mg twice daily and Cozaar 25 mg daily.  She denies dizziness, lightheadedness, chest pain, or shortness of breath BP Readings from Last 3 Encounters:  09/14/21 122/64  07/05/21 (!) 174/72  02/03/21 130/78   GERD -controlled with Protonix 40 mg daily  Autoimmune disease-positive ANA, positive RA.  She is followed by rheumatology.  Currently managed with Plaquenil 200 mg twice daily.  She has not had any recent flares.  She does have chronic pain in bilateral hands and bilateral knees that is more consistent with osteoarthritis  OSA- does not use her cpap any longer. Is looking at an oral appliance.   Depression-takes Zoloft 100 mg nightly.  She does feel well-controlled on this medication  Lymphedema of bilateral legs -uses leg pumps on a regular basis to help with edema.  Hyperlipidemia - managed with crestor 5 mg daily, she denies myalgia or fatigue. She has been out of the medication for the last few months  Lab Results  Component Value Date   CHOL 193 09/13/2020   HDL 68.30 09/13/2020   LDLCALC 107 (H) 09/13/2020   LDLDIRECT 129.7 04/11/2006   TRIG 88.0 09/13/2020   CHOLHDL 3 09/13/2020    Review of Systems  Constitutional: Negative.   HENT: Negative.    Eyes: Negative.   Respiratory: Negative.    Cardiovascular:  Positive for leg swelling.  Gastrointestinal: Negative.   Endocrine: Negative.   Genitourinary: Negative.    Musculoskeletal:  Positive for arthralgias and back pain.  Skin: Negative.   Allergic/Immunologic: Negative.   Neurological: Negative.   Hematological: Negative.   Psychiatric/Behavioral: Negative.     Past Medical History:  Diagnosis Date   Anxiety    Blood transfusion without reported diagnosis    Breast mass, left    Cataract    Depression    Difficult intubation 04/01/2020   Known difficult airway from previous records   Gait disturbance    GERD (gastroesophageal reflux disease)    Hypertension    Lymphedema of both lower extremities    Seeing OT at [facility] to Left Lower leg   Obesity    Rheumatoid arthritis(714.0)    Sleep apnea    cpap   Urinary incontinence    Venous insufficiency     Social History   Socioeconomic History   Marital status: Widowed    Spouse name: Not on file   Number of children: Not on file   Years of education: Not on file   Highest education level: Not on file  Occupational History   Not on file  Tobacco Use   Smoking status: Former    Packs/day: 0.30    Years: 10.00    Total pack years: 3.00    Types: Cigarettes    Quit date: 02/05/1978    Years since quitting: 43.6    Passive exposure: Never   Smokeless tobacco: Never   Tobacco comments:    over 25 years ago...pt doesnt remember when she quit.   Vaping Use   Vaping Use: Never  used  Substance and Sexual Activity   Alcohol use: No   Drug use: No   Sexual activity: Not on file  Other Topics Concern   Not on file  Social History Narrative   Not on file   Social Determinants of Health   Financial Resource Strain: Low Risk  (04/24/2021)   Overall Financial Resource Strain (CARDIA)    Difficulty of Paying Living Expenses: Not hard at all  Food Insecurity: No Food Insecurity (09/13/2021)   Hunger Vital Sign    Worried About Running Out of Food in the Last Year: Never true    Ran Out of Food in the Last Year: Never true  Transportation Needs: No  Transportation Needs (09/13/2021)   PRAPARE - Hydrologist (Medical): No    Lack of Transportation (Non-Medical): No  Physical Activity: Insufficiently Active (04/24/2021)   Exercise Vital Sign    Days of Exercise per Week: 3 days    Minutes of Exercise per Session: 30 min  Stress: No Stress Concern Present (04/24/2021)   [place]    Feeling of Stress : Only a little  Social Connections: Not on file  Intimate Partner Violence: Not on file    Past Surgical History:  Procedure Laterality Date   ABDOMINAL HYSTERECTOMY     with vesicovaginal fistula closure   ANTERIOR HIP REVISION Left 04/01/2020   Procedure: ANTERIOR LEFT HIP REVISION OF ACETABULAR COMPONENT;  Surgeon: Mcarthur Rossetti, MD;  Location: [facility];  Service: Orthopedics;  Laterality: Left;  3E   BLADDER SUSPENSION  2009   sling   BREAST REDUCTION SURGERY     COLONOSCOPY     ESOPHAGOGASTRODUODENOSCOPY     EYE SURGERY     REFRACTIVE SURGERY Right 06/26/2021   REPLACEMENT TOTAL KNEE Left    TOE SURGERY Left    Left great toe   toenail removal Right    all 5 toenails   TOTAL HIP ARTHROPLASTY     TOTAL KNEE ARTHROPLASTY Right 12/09/2013   Procedure: Right Total Knee Arthroplasty;  Surgeon: Newt Minion, MD;  Location: [facility];  Service: Orthopedics;  Laterality: Right;   TOTAL KNEE REVISION Left 11/25/2020   Procedure: LEFT TOTAL KNEE REVISION;  Surgeon: Mcarthur Rossetti, MD;  Location: [facility];  Service: Orthopedics;  Laterality: Left;    Family History  Problem Relation Age of Onset   Diabetes Other    Stroke Other    Asthma Brother    Heart disease Brother    Coronary artery disease Father    Skin cancer Father    Heart disease Father    Coronary artery disease Brother    Colon cancer Neg Hx    Esophageal cancer Neg Hx    Rectal cancer Neg Hx    Stomach cancer Neg Hx     Allergies  Allergen Reactions    Penicillins Rash    Current Outpatient Medications on File Prior to Visit  Medication Sig Dispense Refill   ALEVE 220 MG CAPS Take 220-440 mg by mouth 2 (two) times daily as needed (pain.). (Patient not taking: Reported on 07/25/2021)     ALPRAZolam (XANAX) 0.25 MG tablet Take 1 tablet (0.25 mg total) by mouth 2 (two) times daily as needed for anxiety. 20 tablet 0   aspirin EC 81 MG tablet Take 1 tablet (81 mg total) by mouth 2 (two) times daily. Swallow whole. (Patient taking differently: Take 81 mg  by mouth daily. Swallow whole.) 30 tablet 11   Biotin w/ Vitamins C & E (HAIR SKIN & NAILS GUMMIES PO) Take 2 tablets by mouth daily.     brimonidine (ALPHAGAN) 0.2 % ophthalmic solution Place 1 drop into both eyes 2 (two) times daily.     cholecalciferol (VITAMIN D) 25 MCG (1000 UNIT) tablet Take 1,000 Units by mouth daily.     furosemide (LASIX) 20 MG tablet Take 1 tablet (20 mg total) by mouth 2 (two) times daily. Morning & Midday 180 tablet 3   hydroxychloroquine (PLAQUENIL) 200 MG tablet TAKE 1 TABLET BY MOUTH TWICE A DAY 180 tablet 0   latanoprost (XALATAN) 0.005 % ophthalmic solution Place 1 drop into both eyes at bedtime.     losartan (COZAAR) 25 MG tablet Take 1 tablet (25 mg total) by mouth daily. KEEP APPT FOR REFILLS 90 tablet 0   Multiple Vitamins-Minerals (PRESERVISION AREDS 2+MULTI VIT PO) Take 1 capsule by mouth in the morning and at bedtime.     Omega-3 Fatty Acids (FISH OIL) 1200 MG CAPS Take 2,400 mg by mouth daily.     pantoprazole (PROTONIX) 40 MG tablet TAKE ONE TABLET BY MOUTH ONCE DAILY BEFORE BREAKFAST 90 tablet 1   potassium chloride SA (KLOR-CON M20) 20 MEQ tablet TAKE 2 TABLETS (40 MEQ TOTAL) BY MOUTH EVERY MORNING. 180 tablet 0   prednisoLONE acetate (PRED FORTE) 1 % ophthalmic suspension Place into the right eye.     rosuvastatin (CRESTOR) 5 MG tablet TAKE 1 TABLET BY MOUTH EVERYDAY AT BEDTIME (Patient not taking: Reported on 09/14/2021) 90 tablet 0   sertraline  (ZOLOFT) 100 MG tablet TAKE 1 TABLET BY MOUTH EVERY DAY IN THE MORNING 90 tablet 3   timolol (TIMOPTIC) 0.5 % ophthalmic solution SMARTSIG:In Eye(s)     vitamin B-12 (CYANOCOBALAMIN) 500 MCG tablet Take 1,000 mcg by mouth daily. Gummy     vitamin C (ASCORBIC ACID) 250 MG tablet Take 500 mg by mouth daily. (Gummy)     vitamin E 180 MG (400 UNITS) capsule Take 400 Units by mouth daily.     No current facility-administered medications on file prior to visit.    BP 122/64   Pulse (!) 59   Temp 98.5 F (36.9 C) (Oral)   Ht 5' 9.5" (1.765 m)   Wt 251 lb (113.9 kg)   SpO2 97%   BMI 36.53 kg/m       Objective:   Physical Exam Vitals and nursing note reviewed.  Constitutional:      General: She is not in acute distress.    Appearance: Normal appearance. She is well-developed. She is not ill-appearing.  HENT:     Head: Normocephalic and atraumatic.     Right Ear: Tympanic membrane, ear canal and external ear normal. There is no impacted cerumen.     Left Ear: Tympanic membrane, ear canal and external ear normal. There is no impacted cerumen.     Nose: Nose normal. No congestion or rhinorrhea.     Mouth/Throat:     Mouth: Mucous membranes are moist.     Pharynx: Oropharynx is clear. No oropharyngeal exudate or posterior oropharyngeal erythema.  Eyes:     General:        Right eye: No discharge.        Left eye: No discharge.     Extraocular Movements: Extraocular movements intact.     Conjunctiva/sclera: Conjunctivae normal.     Pupils: Pupils are equal, round, and reactive to  light.  Neck:     Thyroid: No thyromegaly.     Vascular: No carotid bruit.     Trachea: No tracheal deviation.  Cardiovascular:     Rate and Rhythm: Normal rate and regular rhythm.     Pulses: Normal pulses.     Heart sounds: Normal heart sounds. No murmur heard.    No friction rub. No gallop.  Pulmonary:     Effort: Pulmonary effort is normal. No respiratory distress.     Breath sounds: Normal breath  sounds. No stridor. No wheezing, rhonchi or rales.  Chest:     Chest wall: No tenderness.  Abdominal:     General: Abdomen is flat. Bowel sounds are normal. There is no distension.     Palpations: Abdomen is soft. There is no mass.     Tenderness: There is no abdominal tenderness. There is no right CVA tenderness, left CVA tenderness, guarding or rebound.     Hernia: No hernia is present.  Musculoskeletal:        General: No swelling, tenderness, deformity or signs of injury. Normal range of motion.     Cervical back: Normal range of motion and neck supple.     Right lower leg: No edema.     Left lower leg: No edema.  Lymphadenopathy:     Cervical: No cervical adenopathy.  Skin:    General: Skin is warm and dry.     Coloration: Skin is not jaundiced or pale.     Findings: No bruising, erythema, lesion or rash.  Neurological:     General: No focal deficit present.     Mental Status: She is alert and oriented to person, place, and time.     Cranial Nerves: No cranial nerve deficit.     Sensory: No sensory deficit.     Motor: No weakness.     Coordination: Coordination normal.     Gait: Gait normal.     Deep Tendon Reflexes: Reflexes normal.  Psychiatric:        Mood and Affect: Mood normal.        Behavior: Behavior normal.        Thought Content: Thought content normal.        Judgment: Judgment normal.       Assessment & Plan:  1. Routine general medical examination at a health care facility - Follow up in one year or sooner if needed - CBC with Differential/Platelet; Future - Comprehensive metabolic panel; Future - Lipid panel; Future - TSH; Future  2. Essential hypertension - Well controlled.  - No change in medication  - CBC with Differential/Platelet; Future - Comprehensive metabolic panel; Future - Lipid panel; Future - TSH; Future  3. Obstructive sleep apnea - Encouraged to follow up with dentist if she is interested in oral appliance  - CBC with  Differential/Platelet; Future - Comprehensive metabolic panel; Future - Lipid panel; Future - TSH; Future  4. Autoimmune disease ([place]) - Per rheumatology   5. Gastroesophageal reflux disease without esophagitis - Continue PPI  - CBC with Differential/Platelet; Future - Comprehensive metabolic panel; Future - Lipid panel; Future - TSH; Future  6. Primary osteoarthritis of left hip   7. Need for hepatitis C screening test  - Hep C Antibody; Future  8. Major depressive disorder in full remission, unspecified whether recurrent ([place]) - Continue Zoloft   9. Hyperlipidemia, unspecified hyperlipidemia type - Consider increase in statin  - CBC with Differential/Platelet; Future - Comprehensive metabolic panel; Future -  Lipid panel; Future - TSH; Future   Dorothyann Peng, NP

## 2021-09-14 NOTE — Patient Instructions (Addendum)
It was great seeing you today   We will follow up with you regarding your lab work   Please let me know if you need anything   

## 2021-09-15 LAB — HEPATITIS C ANTIBODY: Hepatitis C Ab: NONREACTIVE

## 2021-09-18 ENCOUNTER — Other Ambulatory Visit: Payer: Self-pay | Admitting: *Deleted

## 2021-09-18 MED ORDER — ROSUVASTATIN CALCIUM 5 MG PO TABS: ORAL_TABLET | ORAL | 1 refills | Status: DC

## 2021-10-21 ENCOUNTER — Other Ambulatory Visit: Payer: Self-pay | Admitting: Adult Health

## 2021-10-21 DIAGNOSIS — I10 Essential (primary) hypertension: Secondary | ICD-10-CM

## 2021-10-21 DIAGNOSIS — Z0000 Encounter for general adult medical examination without abnormal findings: Secondary | ICD-10-CM

## 2021-10-22 ENCOUNTER — Other Ambulatory Visit: Payer: Self-pay | Admitting: Physician Assistant

## 2021-10-22 DIAGNOSIS — M359 Systemic involvement of connective tissue, unspecified: Secondary | ICD-10-CM

## 2021-10-23 NOTE — Telephone Encounter (Signed)
Next Visit: 12/04/2021   Last Visit: 07/05/2021   Labs: 09/14/2021 CMP WNL, Eosinophils Relative 8.7   Eye exam: 05/22/2021 WNL   Current Dose per office note 07/05/2021: Plaquenil 200 mg 1 tablet by mouth twice daily.   DX:  Autoimmune disease    Last Fill: 06/23/2021   Okay to refill Plaquenil?

## 2021-11-10 ENCOUNTER — Other Ambulatory Visit: Payer: Self-pay | Admitting: Adult Health

## 2021-11-10 DIAGNOSIS — Z0000 Encounter for general adult medical examination without abnormal findings: Secondary | ICD-10-CM

## 2021-11-10 DIAGNOSIS — I10 Essential (primary) hypertension: Secondary | ICD-10-CM

## 2021-11-20 NOTE — Progress Notes (Unsigned)
Office Visit Note  Patient: Taylor Mccall             Date of Birth: [birth year]           MRN: [id number]             PCP: Dorothyann Peng, NP Referring: Dorothyann Peng, NP Visit Date: 12/04/2021 Occupation: @GUAROCC @  Subjective:  Medication monitoring   History of Present Illness: Taylor Mccall is a 76 y.o. female with history of autoimmune disease, osteoarthritis, DDD.  Patient is taking Plaquenil 200 mg 1 tablet by mouth twice daily.  She continues to tolerate Plaquenil without any side effects.  She denies any signs or symptoms of an autoimmune disease flare since her last office visit.  She states that she has had more frequent falls which she attributes to instability in her legs.  She states that both knee replacements into her left hip replacement doing well overall.  She uses a rollator walker to assist with ambulation.  She denies any recent fractures.  She denies any joint swelling but continues to have significant lymphedema in bilateral lower extremities.  She denies any recent rashes or symptoms of Raynaud's phenomenon.  She continues to have chronic sicca symptoms.  She uses eyedrops for symptomatic relief.  She has occasional nose sores but denies any oral ulcers.  She denies any swollen lymph nodes. She denies any new medical conditions.  She received the annual flu shot and will be proceeding with the COVID booster soon.     Activities of Daily Living:  Patient reports morning stiffness for most of the day.   Patient Reports nocturnal pain.  Difficulty dressing/grooming: Reports Difficulty climbing stairs: Denies Difficulty getting out of chair: Reports Difficulty using hands for taps, buttons, cutlery, and/or writing: Reports  Review of Systems  Constitutional:  Positive for fatigue.  HENT:  Positive for mouth dryness. Negative for mouth sores.   Eyes:  Negative for dryness.  Respiratory:  Positive for shortness of breath.   Cardiovascular:  Negative for  chest pain and palpitations.  Gastrointestinal:  Negative for blood in stool, constipation and diarrhea.  Endocrine: Positive for increased urination.  Genitourinary:  Negative for involuntary urination.  Musculoskeletal:  Positive for joint pain, joint pain, myalgias, morning stiffness and myalgias. Negative for gait problem, joint swelling, muscle weakness and muscle tenderness.  Skin:  Negative for color change, rash, hair loss and sensitivity to sunlight.  Allergic/Immunologic: Negative for susceptible to infections.  Neurological:  Positive for numbness. Negative for dizziness and headaches.  Hematological:  Positive for bruising/bleeding tendency. Negative for swollen glands.  Psychiatric/Behavioral:  Positive for sleep disturbance. Negative for depressed mood. The patient is not nervous/anxious.     PMFS History:  Patient Active Problem List   Diagnosis Date Noted   Status post revision of total knee replacement, left 11/25/2020   Failed total knee, left, subsequent encounter 11/24/2020   Status post revision of total hip 04/01/2020   Polyethylene liner wear following total hip arthroplasty requiring isolated polyethylene liner exchange ([place]) 03/31/2020   Intermediate stage nonexudative age-related macular degeneration of right eye 12/23/2019   Exudative age-related macular degeneration of left eye with inactive choroidal neovascularization ([place]) 12/23/2019   Primary open angle glaucoma of both eyes, moderate stage 12/23/2019   Exudative retinopathy of left eye 12/23/2019   Routine general medical examination at a health care facility 12/11/2016   Primary osteoarthritis of left hip 05/01/2016   Autoimmune disease ([place]) 11/29/2015   High risk  medication use 11/29/2015   Vitamin D deficiency 11/29/2015   Macular degeneration 11/29/2015   Bilateral lower extremity edema 10/26/2015   H/O total knee replacement, bilateral 12/09/2013   Breast mass, left 01/18/2011   Obesity  12/08/2009   Venous (peripheral) insufficiency 07/28/2009   PAIN IN JOINT, ANKLE AND FOOT 01/12/2008   Obstructive sleep apnea 08/29/2007   GAIT DISTURBANCE 08/14/2007   Depression 08/26/2006   Essential hypertension 08/26/2006   Primary osteoarthritis of both hands 08/26/2006   Urinary incontinence 08/26/2006    Past Medical History:  Diagnosis Date   Anxiety    Blood transfusion without reported diagnosis    Breast mass, left    Cataract    Depression    Difficult intubation 04/01/2020   Known difficult airway from previous records   Gait disturbance    GERD (gastroesophageal reflux disease)    Hypertension    Lymphedema of both lower extremities    Seeing OT at [facility] to Left Lower leg   Obesity    Rheumatoid arthritis(714.0)    Sleep apnea    cpap   Urinary incontinence    Venous insufficiency     Family History  Problem Relation Age of Onset   Diabetes Other    Stroke Other    Asthma Brother    Heart disease Brother    Coronary artery disease Father    Skin cancer Father    Heart disease Father    Coronary artery disease Brother    Colon cancer Neg Hx    Esophageal cancer Neg Hx    Rectal cancer Neg Hx    Stomach cancer Neg Hx    Past Surgical History:  Procedure Laterality Date   ABDOMINAL HYSTERECTOMY     with vesicovaginal fistula closure   ANTERIOR HIP REVISION Left 04/01/2020   Procedure: ANTERIOR LEFT HIP REVISION OF ACETABULAR COMPONENT;  Surgeon: Mcarthur Rossetti, MD;  Location: [facility];  Service: Orthopedics;  Laterality: Left;  3E   BLADDER SUSPENSION  2009   sling   BREAST REDUCTION SURGERY     COLONOSCOPY     ESOPHAGOGASTRODUODENOSCOPY     EYE SURGERY     REFRACTIVE SURGERY Right 06/26/2021   REPLACEMENT TOTAL KNEE Left    TOE SURGERY Left    Left great toe   toenail removal Right    all 5 toenails   TOTAL HIP ARTHROPLASTY     TOTAL KNEE ARTHROPLASTY Right 12/09/2013   Procedure: Right Total Knee  Arthroplasty;  Surgeon: Newt Minion, MD;  Location: [facility];  Service: Orthopedics;  Laterality: Right;   TOTAL KNEE REVISION Left 11/25/2020   Procedure: LEFT TOTAL KNEE REVISION;  Surgeon: Mcarthur Rossetti, MD;  Location: [facility];  Service: Orthopedics;  Laterality: Left;   Social History   Social History Narrative   Not on file   Immunization History  Administered Date(s) Administered   Fluad Quad(high Dose 65+) 11/03/2021   Influenza Split 12/14/2010, 12/06/2012   Influenza Whole 02/06/2004, 12/08/2009   Influenza, High Dose Seasonal PF 10/26/2015, 12/11/2016, 01/06/2018, 10/07/2018, 11/15/2020   Influenza,inj,Quad PF,6+ Mos 10/15/2013, 11/19/2014   Influenza-Unspecified 10/07/2018   PFIZER(Purple Top)SARS-COV-2 Vaccination 03/01/2019, 03/21/2019, 10/06/2019   Pfizer Covid-19 Vaccine Bivalent Booster 53yrs & up 11/15/2020   Pneumococcal Conjugate-13 03/18/2013   Pneumococcal Polysaccharide-23 06/30/2008, 10/26/2015   Td 02/05/2005   Tdap 12/14/2010, 11/03/2021     Objective: Vital Signs: BP 111/73 (BP Location: Left Arm, Patient Position: Sitting, Cuff Size: Large)  Pulse 84   Resp 18   Ht 5\' 10"  (1.778 m)   Wt 254 lb 3.2 oz (115.3 kg)   BMI 36.47 kg/m    Physical Exam Vitals and nursing note reviewed.  Constitutional:      Appearance: She is well-developed.  HENT:     Head: Normocephalic and atraumatic.  Eyes:     Conjunctiva/sclera: Conjunctivae normal.  Cardiovascular:     Rate and Rhythm: Normal rate and regular rhythm.     Heart sounds: Normal heart sounds.  Pulmonary:     Effort: Pulmonary effort is normal.     Breath sounds: Normal breath sounds.  Abdominal:     General: Bowel sounds are normal.     Palpations: Abdomen is soft.  Musculoskeletal:     Cervical back: Normal range of motion.  Skin:    General: Skin is warm and dry.     Capillary Refill: Capillary refill takes less than 2 seconds.  Neurological:     Mental Status: She is alert and  oriented to person, place, and time.  Psychiatric:        Behavior: Behavior normal.      Musculoskeletal Exam: C-spine limited and painful ROM of the C-spine.  Painful ROM of left shoulder. Elbow joints, wrist joints, MCPs, PIPs, and DIPs good ROM with no synovitis.  PIP and DIP thickening consistent with osteoarthritis.  Subluxation of several PIP and DIP joints.  Thickening of MCPs especially right 2nd, 3rd, and 4th.  Left hip replacement has good ROM.  Right hip joint has good ROM with no groin pain.  Bilateral knee replacements have good ROM.  Ankle joints have good ROM with no tenderness or joint swelling.   CDAI Exam: CDAI Score: -- Patient Global: --; Provider Global: -- Swollen: --; Tender: -- Joint Exam 12/04/2021   No joint exam has been documented for this visit   There is currently no information documented on the homunculus. Go to the Rheumatology activity and complete the homunculus joint exam.  Investigation: No additional findings.  Imaging: No results found.  Recent Labs: Lab Results  Component Value Date   WBC 4.4 09/14/2021   HGB 12.5 09/14/2021   PLT 216.0 09/14/2021   NA 142 09/14/2021   K 3.9 09/14/2021   CL 104 09/14/2021   CO2 28 09/14/2021   GLUCOSE 83 09/14/2021   BUN 19 09/14/2021   CREATININE 0.84 09/14/2021   BILITOT 0.7 09/14/2021   ALKPHOS 110 09/14/2021   AST 14 09/14/2021   ALT 7 09/14/2021   PROT 7.1 09/14/2021   ALBUMIN 4.2 09/14/2021   CALCIUM 9.6 09/14/2021   GFRAA 98 07/26/2020    Speciality Comments: PLQ eye exam: 05/22/2021 WNL @ 05/24/2021 Ophthalmology. Follow up in 6 months.  Procedures:  No procedures performed Allergies: Penicillins     Assessment / Plan:     Visit Diagnoses: Autoimmune disease (HCC) - +ANA, +RNP:  Lab work from 07/05/2021 were reviewed today in the office: She has not had any signs or symptoms of an autoimmune disease flare.  She has clinically been doing well taking Plaquenil 200 mg 1 tablet by mouth  twice daily.  She is tolerating Plaquenil without any side effects and has not missed any doses recently.  She has no synovitis on examination today.  She has occasional nasal ulcers and has chronic sicca symptoms which have been unchanged.  No cervical lymphadenopathy.  No symptoms of Raynaud's phenomenon.  No recent rashes or increased hair loss.  No shortness of breath or pleuritic chest pain.  Her lungs were clear to auscultation today.   Protein creatinine ratio within normal limits, double-stranded DNA negative, complements within normal limits, ESR within normal limits.  The following lab work will be updated today.  She will remain on Plaquenil as prescribed.  She was advised to notify us if she develops signs or symptoms of a flare.  She will follow-up in the office in 5 months or sooner if needed.  - Plan: CBC with Differential/Platelet, COMPLETE METABOLIC PANEL WITH GFR, Protein / creatinine ratio, urine, ANA, Anti-DNA antibody, double-stranded, C3 and C4, Sedimentation rate, hydroxychloroquine (PLAQUENIL) 200 MG tablet  High risk medication use - Plaquenil 200 mg 1 tablet by mouth twice daily.  CBC and CMP were drawn on 09/14/2021. CBC and CMP updated today.  PLQ eye exam: 05/22/2021 WNL @ [facility].   - Plan: CBC with Differential/Platelet, COMPLETE METABOLIC PANEL WITH GFR Patient received the annual flu shot and is planning on getting the COVID-19 booster.  Primary osteoarthritis of both hands: She has PIP and DIP thickening consistent with osteoarthritis of both hands.  Subluxation of several PIP and DIP joints.  No tenderness or inflammation.  Gust the importance of joint protection and muscle strengthening.  Chronic left shoulder pain: Chronic pain and stiffness.  Limited abduction and internal rotation with discomfort.  Tenderness of the left shoulder on exam.   Carpal tunnel syndrome, right upper limb - She has moderate right median nerve entrapment at wrist revealed on NCV  with EMG on 01/05/2020. She has a carpal tunnel night splint which she wears if she is more symptomatic.   S/P total left hip arthroplasty - Revision 04/01/2020 by Dr. Ninfa Linden. Doing well overall.  Good ROM with no groin pain currently.   H/O total knee replacement, bilateral - S/p revision of left knee replacement on 11/25/20.  Good ROM of both knee replacements. Using rollator walker to assist with ambulation.  Discussed the importance of lower extremity muscle strengthening and fall prevention.  Offered a referral to physical therapy for lower extremity muscle strengthening but she would like to try home exercises first.  Her goal is to try to ride her stationary bike twice daily for 15 minutes at a time.  DDD (degenerative disc disease), cervical - Dr. Louanne Skye: Chronic pain and stiffness. Limited rotation with lateral rotation.  No symptoms of radiculopathy.   DDD (degenerative disc disease), lumbar: No midline spinal tenderness at this time.  Using a rollator walker to assist with ambulation.  Other medical conditions are listed as follows:   History of macular degeneration  Vitamin D deficiency: She is taking vitamin D 1000 units daily.   History of sleep apnea  History of depression  Lymphedema: Wearing compression stockings.   History of glaucoma  Orders: Orders Placed This Encounter  Procedures   CBC with Differential/Platelet   COMPLETE METABOLIC PANEL WITH GFR   Protein / creatinine ratio, urine   ANA   Anti-DNA antibody, double-stranded   C3 and C4   Sedimentation rate   Meds ordered this encounter  Medications   hydroxychloroquine (PLAQUENIL) 200 MG tablet    Sig: Take 1 tablet (200 mg total) by mouth 2 (two) times daily.    Dispense:  180 tablet    Refill:  0    Follow-Up Instructions: Return in about 5 months (around 05/05/2022) for Autoimmune Disease, Osteoarthritis, DDD.   Ofilia Neas, PA-C  Note - This record has been created using  Editor, commissioning.   Chart creation errors have been sought, but may not always  have been located. Such creation errors do not reflect on  the standard of medical care.

## 2021-12-04 ENCOUNTER — Ambulatory Visit: Payer: Medicare HMO | Attending: Physician Assistant | Admitting: Physician Assistant

## 2021-12-04 ENCOUNTER — Encounter: Payer: Self-pay | Admitting: Physician Assistant

## 2021-12-04 VITALS — BP 111/73 | HR 84 | Resp 18 | Ht 70.0 in | Wt 254.2 lb

## 2021-12-04 DIAGNOSIS — Z8669 Personal history of other diseases of the nervous system and sense organs: Secondary | ICD-10-CM

## 2021-12-04 DIAGNOSIS — E559 Vitamin D deficiency, unspecified: Secondary | ICD-10-CM | POA: Diagnosis not present

## 2021-12-04 DIAGNOSIS — Z96653 Presence of artificial knee joint, bilateral: Secondary | ICD-10-CM

## 2021-12-04 DIAGNOSIS — Z8659 Personal history of other mental and behavioral disorders: Secondary | ICD-10-CM

## 2021-12-04 DIAGNOSIS — Z96642 Presence of left artificial hip joint: Secondary | ICD-10-CM | POA: Diagnosis not present

## 2021-12-04 DIAGNOSIS — M359 Systemic involvement of connective tissue, unspecified: Secondary | ICD-10-CM | POA: Diagnosis not present

## 2021-12-04 DIAGNOSIS — Z79899 Other long term (current) drug therapy: Secondary | ICD-10-CM

## 2021-12-04 DIAGNOSIS — M25512 Pain in left shoulder: Secondary | ICD-10-CM | POA: Diagnosis not present

## 2021-12-04 DIAGNOSIS — M19042 Primary osteoarthritis, left hand: Secondary | ICD-10-CM

## 2021-12-04 DIAGNOSIS — I890 Lymphedema, not elsewhere classified: Secondary | ICD-10-CM

## 2021-12-04 DIAGNOSIS — M5030 Other cervical disc degeneration, unspecified cervical region: Secondary | ICD-10-CM

## 2021-12-04 DIAGNOSIS — G8929 Other chronic pain: Secondary | ICD-10-CM

## 2021-12-04 DIAGNOSIS — M19041 Primary osteoarthritis, right hand: Secondary | ICD-10-CM | POA: Diagnosis not present

## 2021-12-04 DIAGNOSIS — M5136 Other intervertebral disc degeneration, lumbar region: Secondary | ICD-10-CM | POA: Diagnosis not present

## 2021-12-04 DIAGNOSIS — R600 Localized edema: Secondary | ICD-10-CM

## 2021-12-04 DIAGNOSIS — G5601 Carpal tunnel syndrome, right upper limb: Secondary | ICD-10-CM | POA: Diagnosis not present

## 2021-12-04 MED ORDER — HYDROXYCHLOROQUINE SULFATE 200 MG PO TABS: 200.0000 mg | ORAL_TABLET | Freq: Two times a day (BID) | ORAL | 0 refills | Status: DC

## 2021-12-05 NOTE — Progress Notes (Signed)
dsDNA negative.

## 2021-12-05 NOTE — Progress Notes (Signed)
CBC and CMP WNL.   ESR WNL. Protein creatinine ratio WNL.  Complements WNL.

## 2021-12-06 LAB — CBC WITH DIFFERENTIAL/PLATELET
Absolute Monocytes: 339 cells/uL (ref 200–950)
Basophils Absolute: 51 cells/uL (ref 0–200)
Basophils Relative: 1.3 %
Eosinophils Absolute: 363 cells/uL (ref 15–500)
Eosinophils Relative: 9.3 %
HCT: 35.7 % (ref 35.0–45.0)
Hemoglobin: 11.9 g/dL (ref 11.7–15.5)
Lymphs Abs: 1022 cells/uL (ref 850–3900)
MCH: 29.5 pg (ref 27.0–33.0)
MCHC: 33.3 g/dL (ref 32.0–36.0)
MCV: 88.4 fL (ref 80.0–100.0)
MPV: 11.4 fL (ref 7.5–12.5)
Monocytes Relative: 8.7 %
Neutro Abs: 2126 cells/uL (ref 1500–7800)
Neutrophils Relative %: 54.5 %
Platelets: 226 10*3/uL (ref 140–400)
RBC: 4.04 10*6/uL (ref 3.80–5.10)
RDW: 11.8 % (ref 11.0–15.0)
Total Lymphocyte: 26.2 %
WBC: 3.9 10*3/uL (ref 3.8–10.8)

## 2021-12-06 LAB — COMPLETE METABOLIC PANEL WITH GFR
AG Ratio: 1.5 (calc) (ref 1.0–2.5)
ALT: 11 U/L (ref 6–29)
AST: 14 U/L (ref 10–35)
Albumin: 4.1 g/dL (ref 3.6–5.1)
Alkaline phosphatase (APISO): 102 U/L (ref 37–153)
BUN: 13 mg/dL (ref 7–25)
CO2: 28 mmol/L (ref 20–32)
Calcium: 10 mg/dL (ref 8.6–10.4)
Chloride: 106 mmol/L (ref 98–110)
Creat: 0.87 mg/dL (ref 0.60–1.00)
Globulin: 2.7 g/dL (calc) (ref 1.9–3.7)
Glucose, Bld: 90 mg/dL (ref 65–99)
Potassium: 4.1 mmol/L (ref 3.5–5.3)
Sodium: 143 mmol/L (ref 135–146)
Total Bilirubin: 0.9 mg/dL (ref 0.2–1.2)
Total Protein: 6.8 g/dL (ref 6.1–8.1)
eGFR: 68 mL/min/{1.73_m2} (ref >=60)

## 2021-12-06 LAB — ANTI-NUCLEAR AB-TITER (ANA TITER)
ANA TITER: 1:80 {titer} — ABNORMAL HIGH
ANA Titer 1: 1:40 {titer} — ABNORMAL HIGH

## 2021-12-06 LAB — PROTEIN / CREATININE RATIO, URINE
Creatinine, Urine: 185 mg/dL (ref 20–275)
Protein/Creat Ratio: 76 mg/g creat (ref 24–184)
Protein/Creatinine Ratio: 0.076 mg/mg creat (ref 0.024–0.184)
Total Protein, Urine: 14 mg/dL (ref 5–24)

## 2021-12-06 LAB — C3 AND C4
C3 Complement: 113 mg/dL (ref 83–193)
C4 Complement: 30 mg/dL (ref 15–57)

## 2021-12-06 LAB — SEDIMENTATION RATE: Sed Rate: 6 mm/h (ref 0–30)

## 2021-12-06 LAB — ANTI-DNA ANTIBODY, DOUBLE-STRANDED: ds DNA Ab: 1 IU/mL

## 2021-12-06 LAB — ANA: Anti Nuclear Antibody (ANA): POSITIVE — AB

## 2021-12-07 NOTE — Progress Notes (Signed)
ANA remains positive.

## 2021-12-11 DIAGNOSIS — Z79899 Other long term (current) drug therapy: Secondary | ICD-10-CM | POA: Diagnosis not present

## 2021-12-11 DIAGNOSIS — M069 Rheumatoid arthritis, unspecified: Secondary | ICD-10-CM | POA: Diagnosis not present

## 2021-12-11 DIAGNOSIS — H401132 Primary open-angle glaucoma, bilateral, moderate stage: Secondary | ICD-10-CM | POA: Diagnosis not present

## 2021-12-11 DIAGNOSIS — H353112 Nonexudative age-related macular degeneration, right eye, intermediate dry stage: Secondary | ICD-10-CM | POA: Diagnosis not present

## 2021-12-11 DIAGNOSIS — H353222 Exudative age-related macular degeneration, left eye, with inactive choroidal neovascularization: Secondary | ICD-10-CM | POA: Diagnosis not present

## 2021-12-11 DIAGNOSIS — H524 Presbyopia: Secondary | ICD-10-CM | POA: Diagnosis not present

## 2021-12-13 DIAGNOSIS — F325 Major depressive disorder, single episode, in full remission: Secondary | ICD-10-CM | POA: Diagnosis not present

## 2021-12-13 DIAGNOSIS — D8481 Immunodeficiency due to conditions classified elsewhere: Secondary | ICD-10-CM | POA: Diagnosis not present

## 2021-12-13 DIAGNOSIS — R69 Illness, unspecified: Secondary | ICD-10-CM | POA: Diagnosis not present

## 2021-12-13 DIAGNOSIS — F411 Generalized anxiety disorder: Secondary | ICD-10-CM | POA: Diagnosis not present

## 2021-12-13 DIAGNOSIS — D84821 Immunodeficiency due to drugs: Secondary | ICD-10-CM | POA: Diagnosis not present

## 2021-12-13 DIAGNOSIS — E785 Hyperlipidemia, unspecified: Secondary | ICD-10-CM | POA: Diagnosis not present

## 2021-12-13 DIAGNOSIS — G1220 Motor neuron disease, unspecified: Secondary | ICD-10-CM | POA: Diagnosis not present

## 2021-12-13 DIAGNOSIS — I10 Essential (primary) hypertension: Secondary | ICD-10-CM | POA: Diagnosis not present

## 2021-12-13 DIAGNOSIS — M0550 Rheumatoid polyneuropathy with rheumatoid arthritis of unspecified site: Secondary | ICD-10-CM | POA: Diagnosis not present

## 2021-12-13 DIAGNOSIS — H409 Unspecified glaucoma: Secondary | ICD-10-CM | POA: Diagnosis not present

## 2021-12-13 DIAGNOSIS — M3500 Sicca syndrome, unspecified: Secondary | ICD-10-CM | POA: Diagnosis not present

## 2021-12-13 DIAGNOSIS — I739 Peripheral vascular disease, unspecified: Secondary | ICD-10-CM | POA: Diagnosis not present

## 2021-12-22 DIAGNOSIS — H524 Presbyopia: Secondary | ICD-10-CM | POA: Diagnosis not present

## 2021-12-22 DIAGNOSIS — H52223 Regular astigmatism, bilateral: Secondary | ICD-10-CM | POA: Diagnosis not present

## 2022-01-17 ENCOUNTER — Telehealth: Payer: Self-pay | Admitting: *Deleted

## 2022-01-17 NOTE — Patient Outreach (Signed)
  Care Coordination   01/17/2022 Name: EIZA CANNIFF MRN: [id number] DOB: [birth year]   Care Coordination Outreach Attempts:  An unsuccessful telephone outreach was attempted today to offer the patient information about available care coordination services as a benefit of their health plan.   Follow Up Plan:  Additional outreach attempts will be made to offer the patient care coordination information and services.   Encounter Outcome:  No Answer   Care Coordination Interventions:  No, not indicated    Raina Mina, RN Care Management Coordinator [place] Office [phone]

## 2022-02-04 ENCOUNTER — Other Ambulatory Visit: Payer: Self-pay | Admitting: Adult Health

## 2022-02-04 DIAGNOSIS — I10 Essential (primary) hypertension: Secondary | ICD-10-CM

## 2022-03-20 ENCOUNTER — Ambulatory Visit: Payer: Medicare Other | Admitting: Adult Health

## 2022-03-21 ENCOUNTER — Ambulatory Visit: Payer: Medicare Other | Admitting: Adult Health

## 2022-03-27 ENCOUNTER — Ambulatory Visit (INDEPENDENT_AMBULATORY_CARE_PROVIDER_SITE_OTHER): Payer: Medicare Other | Admitting: Adult Health

## 2022-03-27 ENCOUNTER — Encounter: Payer: Self-pay | Admitting: Adult Health

## 2022-03-27 VITALS — BP 110/70 | HR 70 | Temp 97.9°F | Ht 70.0 in | Wt 257.0 lb

## 2022-03-27 DIAGNOSIS — Z9109 Other allergy status, other than to drugs and biological substances: Secondary | ICD-10-CM

## 2022-03-27 DIAGNOSIS — G4733 Obstructive sleep apnea (adult) (pediatric): Secondary | ICD-10-CM | POA: Diagnosis not present

## 2022-03-27 MED ORDER — FLUTICASONE PROPIONATE 50 MCG/ACT NA SUSP: 2.0000 | Freq: Every day | NASAL | 6 refills | Status: DC

## 2022-03-27 NOTE — Progress Notes (Unsigned)
Subjective:    Patient ID: Taylor Mccall, female    DOB: 06/25/1945, 77 y.o.   MRN: [id number]  HPI 77 year old female who  has a past medical history of Anxiety, Blood transfusion without reported diagnosis, Breast mass, left, Cataract, Depression, Difficult intubation (04/01/2020), Gait disturbance, GERD (gastroesophageal reflux disease), Hypertension, Lymphedema of both lower extremities, Obesity, Rheumatoid arthritis(714.0), Sleep apnea, Urinary incontinence, and Venous insufficiency.  She presents with her daughter today for multiple issues.   OSA/sleep disturbance -she reports that she has been unable to stay asleep and likely this is due to her OSA.  She has not been wearing her CPAP due to reading something on the Internet or talked about CPAP machines killing people.  She has not been seen by pulmonary since 2020.  Her CPAP is roughly 79 years old.  Similarly she reports that over the last months she has had cold-like symptoms of sneezing, cough that is semiproductive and rhinorrhea with clear drainage.  She has not really been using anything at home to help with her symptoms.  She denies fevers, chills, sinus pain or pressure.  She does not feel acutely ill  Review of Systems See HPI   Past Medical History:  Diagnosis Date   Anxiety    Blood transfusion without reported diagnosis    Breast mass, left    Cataract    Depression    Difficult intubation 04/01/2020   Known difficult airway from previous records   Gait disturbance    GERD (gastroesophageal reflux disease)    Hypertension    Lymphedema of both lower extremities    Seeing OT at [facility] to Left Lower leg   Obesity    Rheumatoid arthritis(714.0)    Sleep apnea    cpap   Urinary incontinence    Venous insufficiency     Social History   Socioeconomic History   Marital status: Widowed    Spouse name: Not on file   Number of children: Not on file   Years of education: Not on  file   Highest education level: 12th grade  Occupational History   Not on file  Tobacco Use   Smoking status: Former    Packs/day: 0.30    Years: 10.00    Total pack years: 3.00    Types: Cigarettes    Quit date: 02/05/1978    Years since quitting: 44.1    Passive exposure: Never   Smokeless tobacco: Never   Tobacco comments:    over 25 years ago...pt doesnt remember when she quit.   Vaping Use   Vaping Use: Never used  Substance and Sexual Activity   Alcohol use: No   Drug use: No   Sexual activity: Not on file  Other Topics Concern   Not on file  Social History Narrative   Not on file   Social Determinants of Health   Financial Resource Strain: Low Risk  (04/24/2021)   Overall Financial Resource Strain (CARDIA)    Difficulty of Paying Living Expenses: Not hard at all  Food Insecurity: No Food Insecurity (03/19/2022)   Hunger Vital Sign    Worried About Running Out of Food in the Last Year: Never true    [place] in the Last Year: Never true  Transportation Needs: Unknown (03/19/2022)   PRAPARE - Transportation    Lack of Transportation (Medical): Patient refused    Lack of Transportation (Non-Medical): Patient refused  Physical Activity: Inactive (03/19/2022)  Exercise Vital Sign    Days of Exercise per Week: 0 days    Minutes of Exercise per Session: 30 min  Stress: Stress Concern Present (03/19/2022)   Micro    Feeling of Stress : Rather much  Social Connections: Unknown (03/19/2022)   Social Connection and Isolation Panel [NHANES]    Frequency of Communication with Friends and Family: More than three times a week    Frequency of Social Gatherings with Friends and Family: Once a week    Attends Religious Services: Never    Marine scientist or Organizations: Not on file    Attends Archivist Meetings: Not on file    Marital Status: Widowed  Intimate Partner Violence: Not on  file    Past Surgical History:  Procedure Laterality Date   ABDOMINAL HYSTERECTOMY     with vesicovaginal fistula closure   ANTERIOR HIP REVISION Left 04/01/2020   Procedure: ANTERIOR LEFT HIP REVISION OF ACETABULAR COMPONENT;  Surgeon: Mcarthur Rossetti, MD;  Location: [facility];  Service: Orthopedics;  Laterality: Left;  3E   BLADDER SUSPENSION  2009   sling   BREAST REDUCTION SURGERY     COLONOSCOPY     ESOPHAGOGASTRODUODENOSCOPY     EYE SURGERY     REFRACTIVE SURGERY Right 06/26/2021   REPLACEMENT TOTAL KNEE Left    TOE SURGERY Left    Left great toe   toenail removal Right    all 5 toenails   TOTAL HIP ARTHROPLASTY     TOTAL KNEE ARTHROPLASTY Right 12/09/2013   Procedure: Right Total Knee Arthroplasty;  Surgeon: Newt Minion, MD;  Location: [facility];  Service: Orthopedics;  Laterality: Right;   TOTAL KNEE REVISION Left 11/25/2020   Procedure: LEFT TOTAL KNEE REVISION;  Surgeon: Mcarthur Rossetti, MD;  Location: [facility];  Service: Orthopedics;  Laterality: Left;    Family History  Problem Relation Age of Onset   Diabetes Other    Stroke Other    Asthma Brother    Heart disease Brother    Coronary artery disease Father    Skin cancer Father    Heart disease Father    Coronary artery disease Brother    Colon cancer Neg Hx    Esophageal cancer Neg Hx    Rectal cancer Neg Hx    Stomach cancer Neg Hx     Allergies  Allergen Reactions   Penicillins Rash    Current Outpatient Medications on File Prior to Visit  Medication Sig Dispense Refill   ALEVE 220 MG CAPS Take 220-440 mg by mouth 2 (two) times daily as needed (pain.).     ALPRAZolam (XANAX) 0.25 MG tablet Take 1 tablet (0.25 mg total) by mouth 2 (two) times daily as needed for anxiety. 20 tablet 0   aspirin EC 81 MG tablet Take 1 tablet (81 mg total) by mouth 2 (two) times daily. Swallow whole. (Patient taking differently: Take 81 mg by mouth daily. Swallow whole.) 30 tablet 11   brimonidine (ALPHAGAN)  0.2 % ophthalmic solution Place 1 drop into both eyes 2 (two) times daily.     cholecalciferol (VITAMIN D) 25 MCG (1000 UNIT) tablet Take 1,000 Units by mouth daily.     furosemide (LASIX) 20 MG tablet TAKE 1 TABLET BY MOUTH 2 TIMES DAILY. MORNING AND MIDDAY 180 tablet 3   hydroxychloroquine (PLAQUENIL) 200 MG tablet Take 1 tablet (200 mg total) by mouth 2 (two) times daily.  180 tablet 0   KLOR-CON M20 20 MEQ tablet TAKE 2 TABLETS (40 MEQ TOTAL) BY MOUTH EVERY MORNING. 180 tablet 0   latanoprost (XALATAN) 0.005 % ophthalmic solution Place 1 drop into both eyes at bedtime.     losartan (COZAAR) 25 MG tablet TAKE 1 TABLET (25 MG TOTAL) BY MOUTH DAILY. KEEP APPT FOR REFILLS 90 tablet 0   Multiple Vitamins-Minerals (PRESERVISION AREDS 2+MULTI VIT PO) Take 1 capsule by mouth in the morning and at bedtime.     Omega-3 Fatty Acids (FISH OIL) 1200 MG CAPS Take 2,400 mg by mouth daily.     pantoprazole (PROTONIX) 40 MG tablet TAKE ONE TABLET BY MOUTH ONCE DAILY BEFORE BREAKFAST 90 tablet 1   prednisoLONE acetate (PRED FORTE) 1 % ophthalmic suspension Place into both eyes.     rosuvastatin (CRESTOR) 5 MG tablet TAKE 1 TABLET BY MOUTH EVERYDAY AT BEDTIME 90 tablet 1   sertraline (ZOLOFT) 100 MG tablet TAKE 1 TABLET BY MOUTH EVERY DAY IN THE MORNING 90 tablet 3   timolol (TIMOPTIC) 0.5 % ophthalmic solution SMARTSIG:In Eye(s)     vitamin B-12 (CYANOCOBALAMIN) 500 MCG tablet Take 1,000 mcg by mouth daily. Gummy     vitamin C (ASCORBIC ACID) 250 MG tablet Take 500 mg by mouth daily. (Gummy)     vitamin E 180 MG (400 UNITS) capsule Take 400 Units by mouth daily.     azithromycin (ZITHROMAX) 500 MG tablet Take by mouth. (Patient not taking: Reported on 03/27/2022)     No current facility-administered medications on file prior to visit.    BP 110/70   Pulse 70   Temp 97.9 F (36.6 C) (Oral)   Ht 5' 10"$  (1.778 m)   Wt 257 lb (116.6 kg)   SpO2 99%   BMI 36.88 kg/m       Objective:   Physical  Exam Vitals and nursing note reviewed.  Constitutional:      Appearance: Normal appearance.  HENT:     Nose: Rhinorrhea present. No congestion. Rhinorrhea is clear.     Right Turbinates: Not enlarged or swollen.     Left Turbinates: Not swollen.     Right Sinus: No maxillary sinus tenderness or frontal sinus tenderness.     Left Sinus: No maxillary sinus tenderness or frontal sinus tenderness.     Mouth/Throat:     Mouth: Mucous membranes are moist.     Pharynx: Oropharynx is clear. No oropharyngeal exudate or posterior oropharyngeal erythema.  Cardiovascular:     Rate and Rhythm: Normal rate and regular rhythm.     Pulses: Normal pulses.     Heart sounds: Normal heart sounds.  Pulmonary:     Effort: Pulmonary effort is normal.     Breath sounds: Normal breath sounds.  Musculoskeletal:        General: Normal range of motion.  Skin:    General: Skin is warm and dry.     Capillary Refill: Capillary refill takes less than 2 seconds.  Neurological:     General: No focal deficit present.     Mental Status: She is alert and oriented to person, place, and time.  Psychiatric:        Mood and Affect: Mood normal.        Behavior: Behavior normal.        Thought Content: Thought content normal.        Judgment: Judgment normal.       Assessment & Plan:  1. Environmental allergies -  No signs of sinusitis. Will send in Flonase for her to use for two weeks and then stop - Follow up if not resolved  - fluticasone (FLONASE) 50 MCG/ACT nasal spray; Place 2 sprays into both nostrils daily.  Dispense: 16 g; Refill: 6  2. Obstructive sleep apnea  - Ambulatory referral to Pulmonology

## 2022-03-27 NOTE — Patient Instructions (Signed)
I am going to refer you to Pulmonary for sleep apnea   I am also going to send in Flonase for your nasal congestion - I think this is more allergies then anything

## 2022-03-28 LAB — HM MAMMOGRAPHY

## 2022-04-02 ENCOUNTER — Other Ambulatory Visit: Payer: Self-pay | Admitting: Adult Health

## 2022-04-02 DIAGNOSIS — I10 Essential (primary) hypertension: Secondary | ICD-10-CM

## 2022-04-02 DIAGNOSIS — Z0000 Encounter for general adult medical examination without abnormal findings: Secondary | ICD-10-CM

## 2022-04-03 ENCOUNTER — Encounter: Payer: Self-pay | Admitting: Orthopaedic Surgery

## 2022-04-03 ENCOUNTER — Ambulatory Visit (INDEPENDENT_AMBULATORY_CARE_PROVIDER_SITE_OTHER): Payer: Medicare Other | Admitting: Orthopaedic Surgery

## 2022-04-03 ENCOUNTER — Ambulatory Visit (INDEPENDENT_AMBULATORY_CARE_PROVIDER_SITE_OTHER): Payer: Medicare Other

## 2022-04-03 VITALS — BP 120/76 | HR 86 | Ht 70.0 in | Wt 257.0 lb

## 2022-04-03 DIAGNOSIS — M542 Cervicalgia: Secondary | ICD-10-CM | POA: Diagnosis not present

## 2022-04-03 IMAGING — DX DG LUMBAR SPINE COMPLETE 4+V
5 series · 5 of 5 positions shown · non-contrast
Comparison: None.

CLINICAL DATA: Right-sided low back pain, chronic, worsening for
the last 2 weeks, no known injury

EXAM:
LUMBAR SPINE - COMPLETE 4+ VIEW

[l-spine ap]
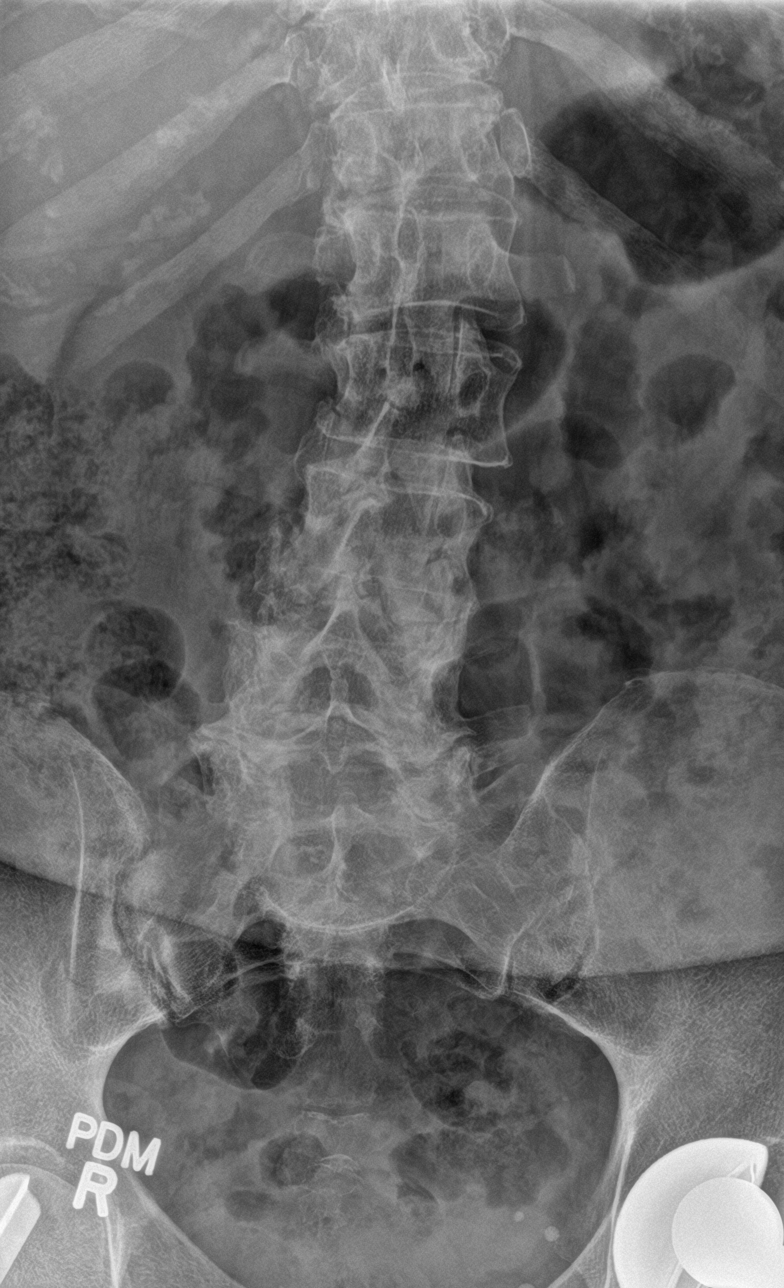

[l-spine obl (1 of 2)]
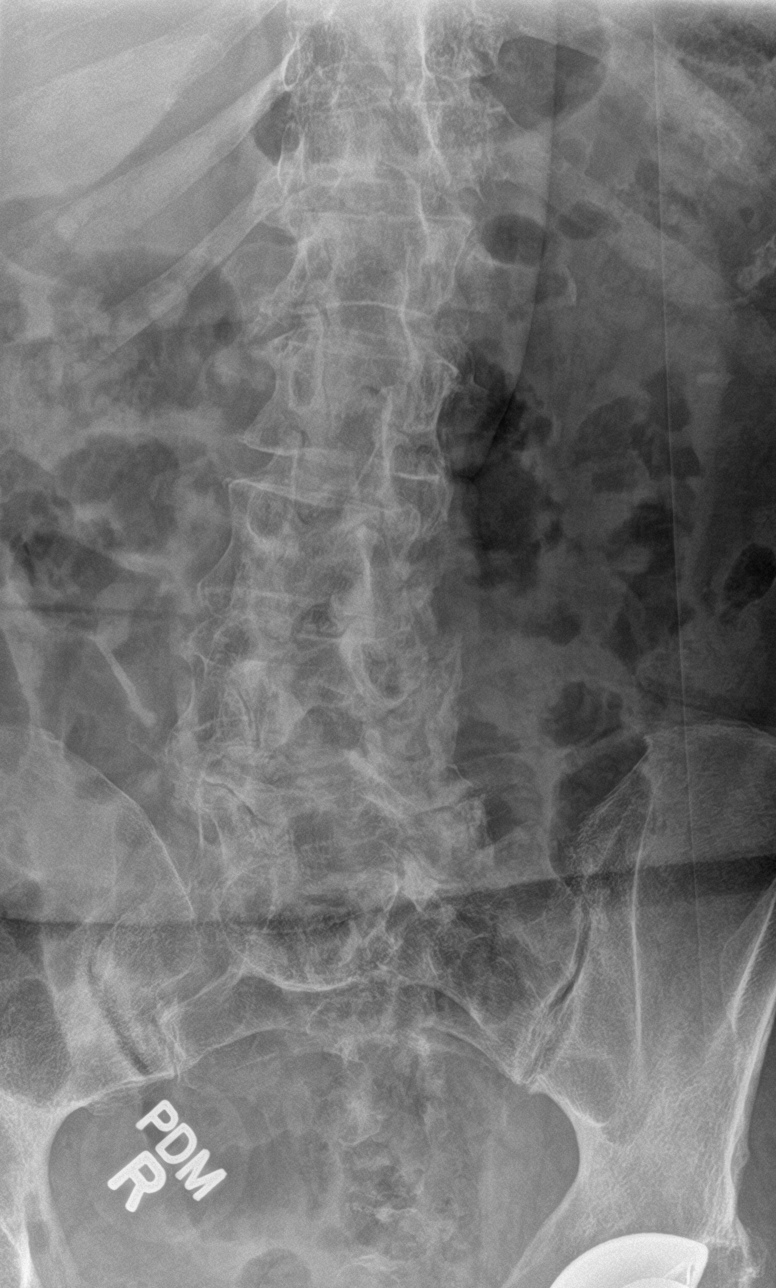

[l-spine obl (2 of 2)]
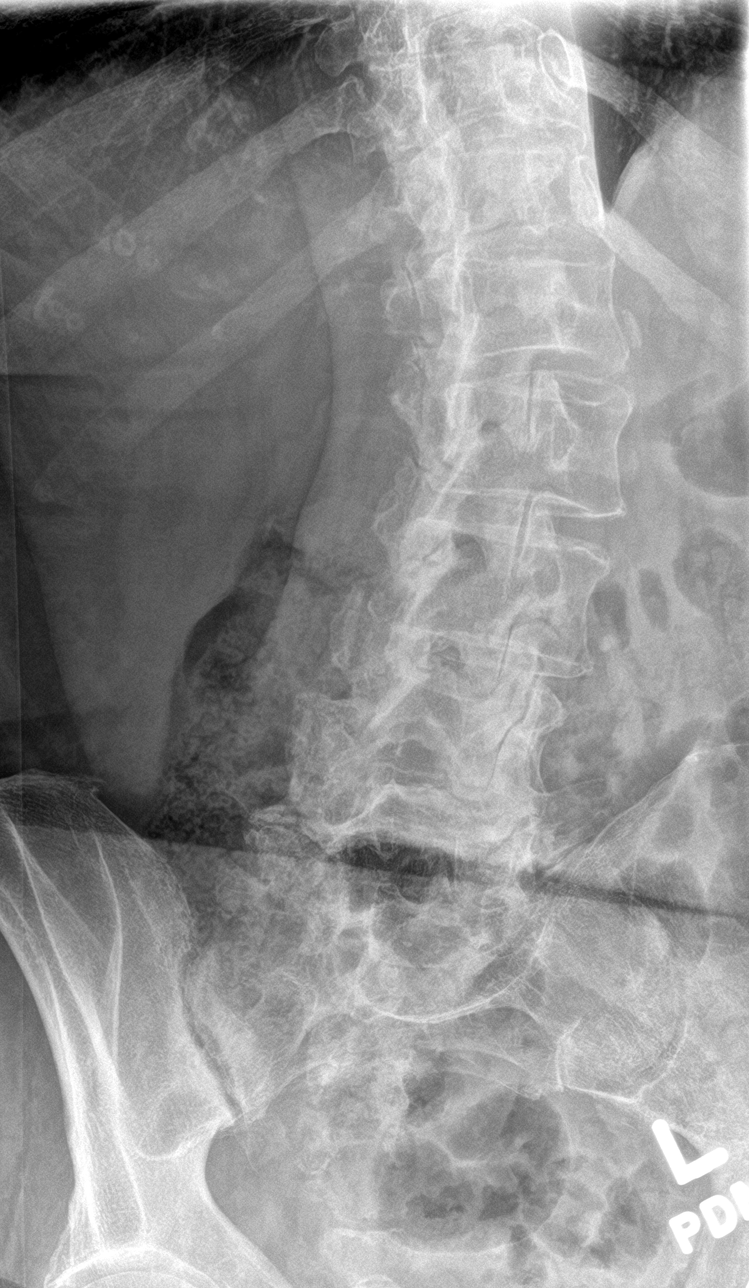

[l-spine lat]
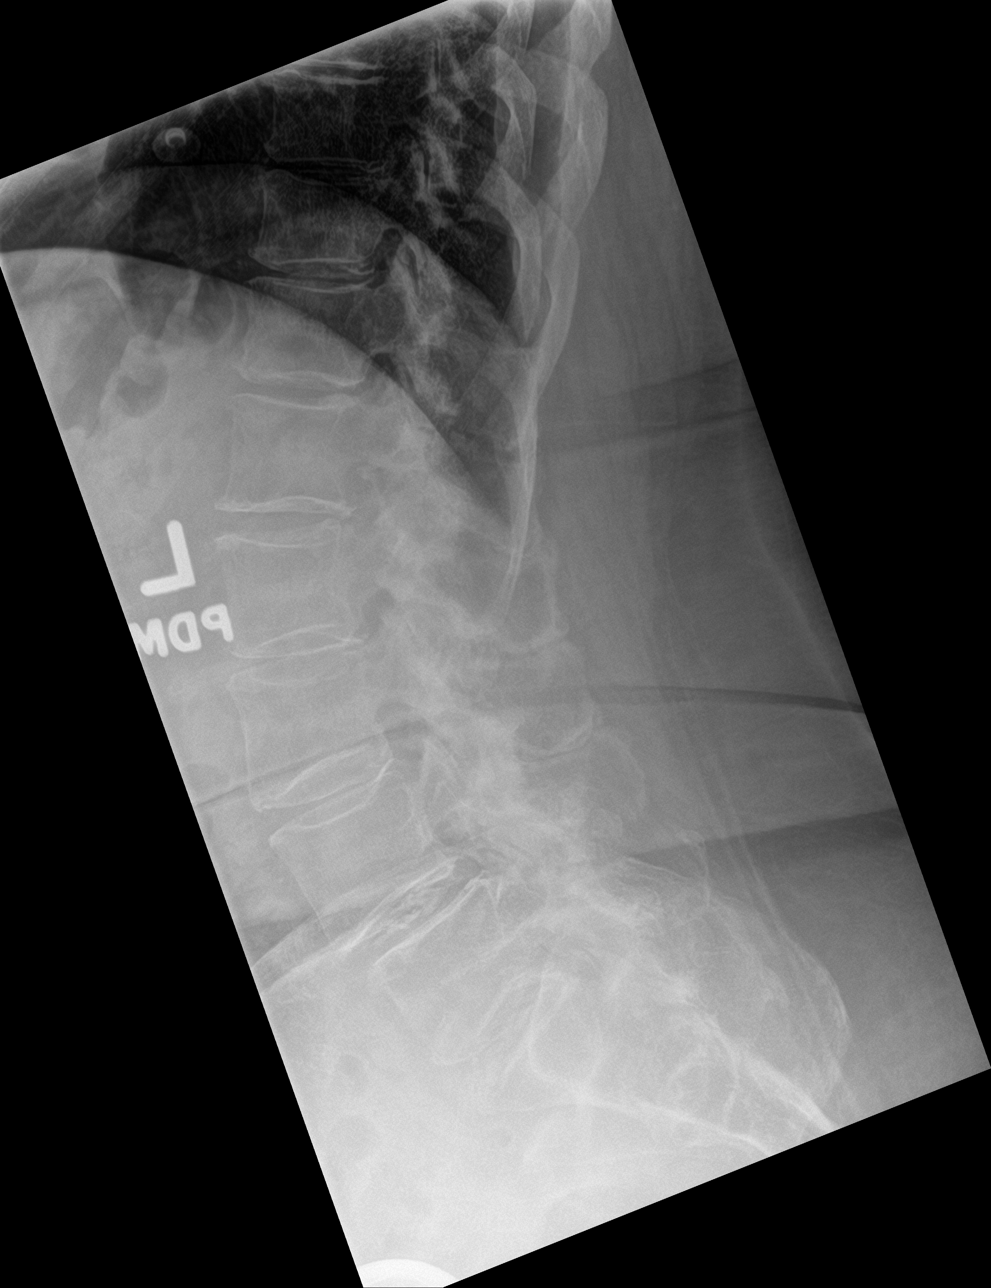

[l-spine spot]
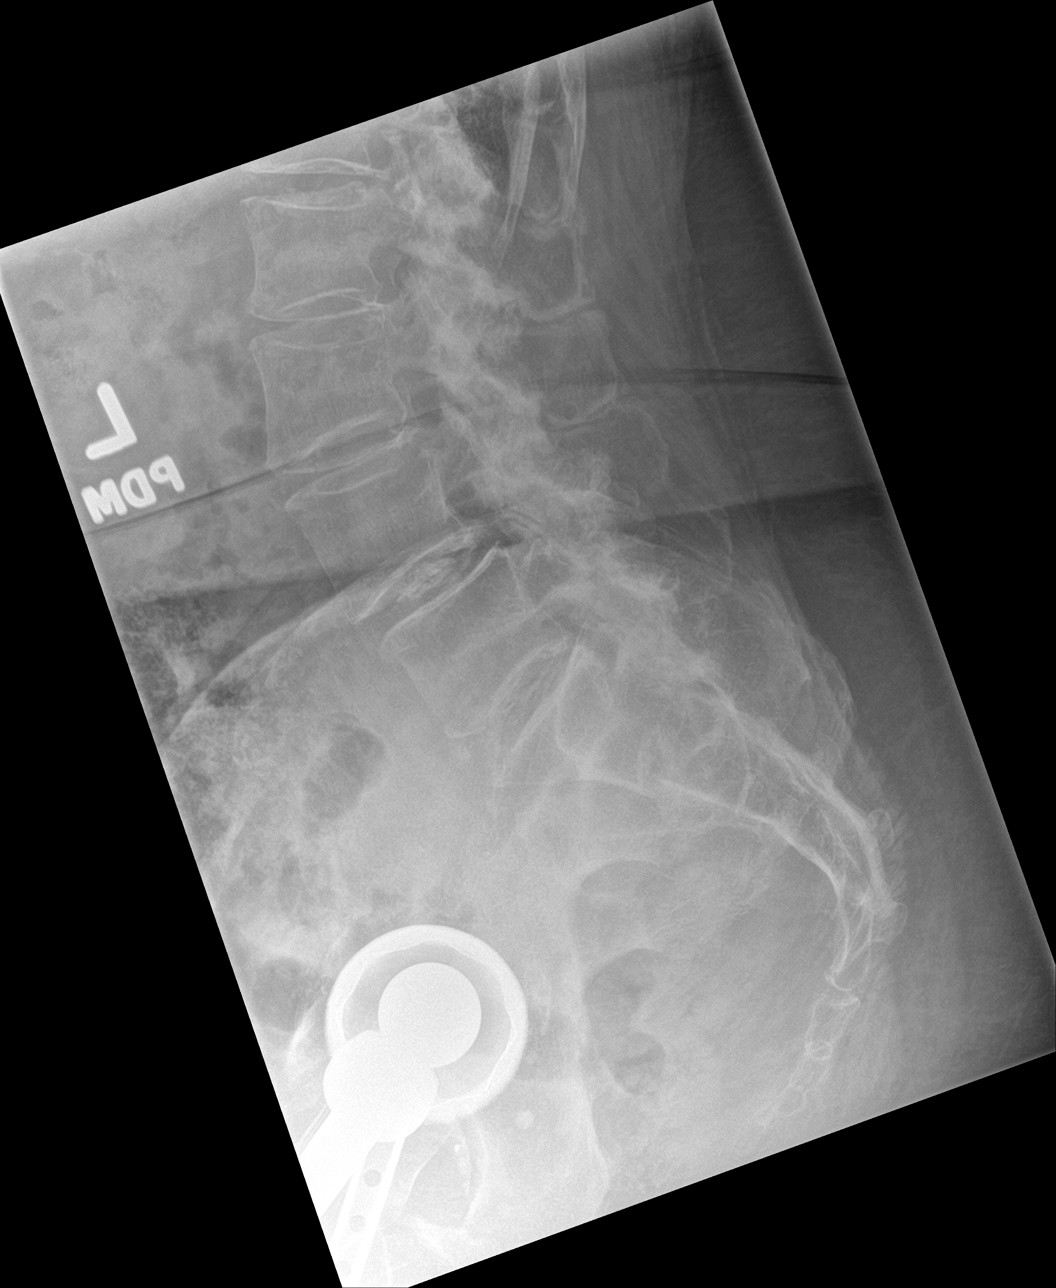

[5 of 5 positions shown; findings below may reference images not displayed]

FINDINGS: No fracture or dislocation of the lumbar spine. There is a
levoscoliosis of the thoracolumbar spine, apex L1, with moderate
associated disc space height loss and osteophytosis and severe
multilevel facet degenerative change. Nonobstructive pattern of
overlying bowel gas.
IMPRESSION: 1.  No fracture or dislocation of the lumbar spine.

2. There is a levoscoliosis of the thoracolumbar spine, apex L1,
with moderate associated disc degenerative disease and severe
multilevel facet degenerative change. Lumbar disc and neural
foraminal pathology may be further evaluated by MRI if indicated by
neurologically localizing signs and symptoms.

## 2022-04-03 NOTE — Progress Notes (Addendum)
Office Visit Note   Patient: Taylor Mccall           Date of Birth: 1945/08/23           MRN: [id number] Visit Date: 04/03/2022              Requested by: Dorothyann Peng, NP [place],  [place] [address] PCP: Dorothyann Peng, NP   Assessment & Plan: Visit Diagnoses:  1. Neck pain   2. Cervicalgia     Plan: With patient's increased problems with neck pain shoulder pain and gait disturbance and history of known cervical stenosis would recommend proceeding with a repeat MRI scan make sure she does not have early cord changes.  Previous MRI scan and radiographs as well as today's radiographs were reviewed and discussed with patient as well as her daughter.  Follow-Up Instructions: No follow-ups on file.   Orders:  Orders Placed This Encounter  Procedures   XR Cervical Spine 2 or 3 views   MR Cervical Spine w/o contrast   No orders of the defined types were placed in this encounter.     Procedures: No procedures performed   Clinical Data: No additional findings.   Subjective: Chief Complaint  Patient presents with   Neck - Pain    HPI 77 year old female with rheumatoid arthritis seen by Dr. Estanislado Pandy on [place] with problems with gait balance problems.  She uses a cane.  She had extreme right-sided neck pain has been holding her head with her hand pain behind her ear.  Some pain radiates into her shoulders she has problems with her left shoulder with arthritis.  Her neck pops a lot.  Previous MRI 2021 showed at least moderate level stenosis cervical spine C3-4 and C5-6.  No areas of severe stenosis at other levels.  She thinks her symptoms have progressed she is having more trouble walking.  She has had previous total hip arthroplasty with later polyliner exchange.  She has had left total knee arthroplasty with knee revision and has a right primary knee.  Review of Systems all systems are noncontributory to HPI.   Objective: Vital Signs: BP  120/76   Pulse 86   Ht '5\' 10"'$  (1.778 m)   Wt 257 lb (116.6 kg)   BMI 36.88 kg/m   Physical Exam Constitutional:      Appearance: She is well-developed.  HENT:     Head: Normocephalic.     Right Ear: External ear normal.     Left Ear: External ear normal. There is no impacted cerumen.  Eyes:     Pupils: Pupils are equal, round, and reactive to light.  Neck:     Thyroid: No thyromegaly.     Trachea: No tracheal deviation.  Cardiovascular:     Rate and Rhythm: Normal rate.  Pulmonary:     Effort: Pulmonary effort is normal.  Abdominal:     Palpations: Abdomen is soft.  Musculoskeletal:     Cervical back: No rigidity.  Skin:    General: Skin is warm and dry.  Neurological:     Mental Status: She is alert and oriented to person, place, and time.  Psychiatric:        Behavior: Behavior normal.     Ortho Exam pain patient ambulates slightly wide gait she has some quad weakness with ambulation and right greater than left foot pronation.  Significant brachial plexus tenderness both right and left and only 30 degrees rotation of her neck with sharp  severe pain. Patient ambulating with a cane.  Decreased balance.  She does not have clonus lower extremities.  3+ knee and ankle jerk.  Upper extremity reflexes are 2+. Specialty Comments:  No specialty comments available.  Imaging: No results found.   PMFS History: Patient Active Problem List   Diagnosis Date Noted   Status post revision of total knee replacement, left 11/25/2020   Failed total knee, left, subsequent encounter 11/24/2020   Status post revision of total hip 04/01/2020   Polyethylene liner wear following total hip arthroplasty requiring isolated polyethylene liner exchange ([facility]) 03/31/2020   Intermediate stage nonexudative age-related macular degeneration of right eye 12/23/2019   Exudative age-related macular degeneration of left eye with inactive choroidal neovascularization ([facility]) 12/23/2019   Primary open  angle glaucoma of both eyes, moderate stage 12/23/2019   Exudative retinopathy of left eye 12/23/2019   Routine general medical examination at a health care facility 12/11/2016   Primary osteoarthritis of left hip 05/01/2016   Autoimmune disease ([facility]) 11/29/2015   High risk medication use 11/29/2015   Vitamin D deficiency 11/29/2015   Macular degeneration 11/29/2015   Bilateral lower extremity edema 10/26/2015   H/O total knee replacement, bilateral 12/09/2013   Breast mass, left 01/18/2011   Obesity 12/08/2009   Venous (peripheral) insufficiency 07/28/2009   PAIN IN JOINT, ANKLE AND FOOT 01/12/2008   Obstructive sleep apnea 08/29/2007   GAIT DISTURBANCE 08/14/2007   Depression 08/26/2006   Essential hypertension 08/26/2006   Primary osteoarthritis of both hands 08/26/2006   Urinary incontinence 08/26/2006   Past Medical History:  Diagnosis Date   Anxiety    Blood transfusion without reported diagnosis    Breast mass, left    Cataract    Depression    Difficult intubation 04/01/2020   Known difficult airway from previous records   Gait disturbance    GERD (gastroesophageal reflux disease)    Hypertension    Lymphedema of both lower extremities    Seeing OT at [facility] to Left Lower leg   Obesity    Rheumatoid arthritis(714.0)    Sleep apnea    cpap   Urinary incontinence    Venous insufficiency     Family History  Problem Relation Age of Onset   Diabetes Other    Stroke Other    Asthma Brother    Heart disease Brother    Coronary artery disease Father    Skin cancer Father    Heart disease Father    Coronary artery disease Brother    Colon cancer Neg Hx    Esophageal cancer Neg Hx    Rectal cancer Neg Hx    Stomach cancer Neg Hx     Past Surgical History:  Procedure Laterality Date   ABDOMINAL HYSTERECTOMY     with vesicovaginal fistula closure   ANTERIOR HIP REVISION Left 04/01/2020   Procedure: ANTERIOR LEFT HIP REVISION OF  ACETABULAR COMPONENT;  Surgeon: Mcarthur Rossetti, MD;  Location: [facility];  Service: Orthopedics;  Laterality: Left;  3E   BLADDER SUSPENSION  2009   sling   BREAST REDUCTION SURGERY     COLONOSCOPY     ESOPHAGOGASTRODUODENOSCOPY     EYE SURGERY     REFRACTIVE SURGERY Right 06/26/2021   REPLACEMENT TOTAL KNEE Left    TOE SURGERY Left    Left great toe   toenail removal Right    all 5 toenails   TOTAL HIP ARTHROPLASTY     TOTAL KNEE ARTHROPLASTY  Right 12/09/2013   Procedure: Right Total Knee Arthroplasty;  Surgeon: Newt Minion, MD;  Location: [facility];  Service: Orthopedics;  Laterality: Right;   TOTAL KNEE REVISION Left 11/25/2020   Procedure: LEFT TOTAL KNEE REVISION;  Surgeon: Mcarthur Rossetti, MD;  Location: [facility];  Service: Orthopedics;  Laterality: Left;   Social History   Occupational History   Not on file  Tobacco Use   Smoking status: Former    Packs/day: 0.30    Years: 10.00    Total pack years: 3.00    Types: Cigarettes    Quit date: 02/05/1978    Years since quitting: 44.1    Passive exposure: Never   Smokeless tobacco: Never   Tobacco comments:    over 25 years ago...pt doesnt remember when she quit.   Vaping Use   Vaping Use: Never used  Substance and Sexual Activity   Alcohol use: No   Drug use: No   Sexual activity: Not on file

## 2022-04-19 ENCOUNTER — Telehealth: Payer: Self-pay | Admitting: Adult Health

## 2022-04-19 NOTE — Telephone Encounter (Signed)
Contacted Hessie Knows to schedule their annual wellness visit. Appointment made for 04/26/22.  [facility] direct phone # [phone]

## 2022-04-25 ENCOUNTER — Ambulatory Visit
Admission: RE | Admit: 2022-04-25 | Discharge: 2022-04-25 | Disposition: A | Discharge status: Home / Self Care | Payer: Medicare HMO | Source: Ambulatory Visit | Attending: Orthopaedic Surgery | Admitting: Orthopaedic Surgery

## 2022-04-25 DIAGNOSIS — M542 Cervicalgia: Secondary | ICD-10-CM

## 2022-04-26 ENCOUNTER — Telehealth (INDEPENDENT_AMBULATORY_CARE_PROVIDER_SITE_OTHER): Payer: Medicare HMO | Admitting: Family Medicine

## 2022-04-26 DIAGNOSIS — Z0000 Encounter for general adult medical examination without abnormal findings: Secondary | ICD-10-CM

## 2022-04-26 NOTE — Patient Instructions (Signed)
I really enjoyed getting to talk with you today! I am available on Tuesdays and Thursdays for virtual visits if you have any questions or concerns, or if I can be of any further assistance.   CHECKLIST FROM ANNUAL WELLNESS VISIT:  -Follow up (please call to schedule if not scheduled after visit):  - -yearly for annual wellness visit with primary care office  Here is a list of your preventive care/health maintenance measures and the plan for each if any are due:  Health Maintenance  Topic Date Due   Zoster Vaccines- Shingrix (1 of 2) Never done, if you wish to get - can do at the pharmacy.   COVID-19 Vaccine (5 - 2023-24 season) 10/06/2021, please obtain date from you pharmacy so that we can update this.    Medicare Annual Wellness ([facility])  04/26/2023   DTaP/Tdap/Td (4 - Td or Tdap) 11/04/2031   Pneumonia Vaccine 12+ Years old  Completed   INFLUENZA VACCINE  Completed   DEXA SCAN  Completed   Hepatitis C Screening  Completed   HPV VACCINES  Aged Out   COLONOSCOPY (Pts 45-64yrs Insurance coverage will need to be confirmed)  Discontinued    -See a dentist at least yearly  -Get your eyes checked and then per your eye specialist's recommendations  -Other issues addressed today: -  -I have included below further information regarding a healthy whole foods based diet, physical activity guidelines for adults, stress management and opportunities for social connections. I hope you find this information useful.   -----------------------------------------------------------------------------------------------------------------------------------------------------------------------------------------------------------------------------------------------------------  NUTRITION: -eat real food: lots of colorful vegetables (half the plate) and fruits -5-7 servings of vegetables and fruits per day (fresh or steamed is best), exp. 2 servings of vegetables with lunch and dinner and 2 servings of fruit  per day. Berries and greens such as kale and collards are great choices.  -consume on a regular basis: whole grains (make sure first ingredient on label contains the word "whole"), fresh fruits, fish, nuts, seeds, healthy oils (such as olive oil, avocado oil, grape seed oil) -may eat small amounts of dairy and lean meat on occasion, but avoid processed meats such as ham, bacon, lunch meat, etc. -drink water -try to avoid fast food and pre-packaged foods, processed meat -most experts advise limiting sodium to < 2300mg  per day, should limit further is any chronic conditions such as high blood pressure, heart disease, diabetes, etc. The American Heart Association advised that < 1500mg  is is ideal -try to avoid foods that contain any ingredients with names you do not recognize  -try to avoid sugar/sweets (except for the natural sugar that occurs in fresh fruit) -try to avoid sweet drinks -try to avoid white rice, white bread, pasta (unless whole grain), white or yellow potatoes  EXERCISE GUIDELINES FOR ADULTS: -if you wish to increase your physical activity, do so gradually and with the approval of your doctor -STOP and seek medical care immediately if you have any chest pain, chest discomfort or trouble breathing when starting or increasing exercise  -move and stretch your body, legs, feet and arms when sitting for long periods -Physical activity guidelines for optimal health in adults: -least 150 minutes per week of aerobic exercise (can talk, but not sing) once approved by your doctor, 20-30 minutes of sustained activity or two 10 minute episodes of sustained activity every day.  -resistance training at least 2 days per week if approved by your doctor -balance exercises 3+ days per week:   Stand somewhere where you  have something sturdy to hold onto if you lose balance.    1) lift up on toes, start with 5x per day and work up to 20x   2) stand and lift on leg straight out to the side so that foot  is a few inches of the floor, start with 5x each side and work up to 20x each side   3) stand on one foot, start with 5 seconds each side and work up to 20 seconds on each side  If you need ideas or help with getting more active:  -Silver sneakers https://tools.silversneakers.com  -Walk with a Doc: http://stephens-thompson.biz/  -try to include resistance (weight lifting/strength building) and balance exercises twice per week: or the following link for ideas: ChessContest.fr  UpdateClothing.com.cy  STRESS MANAGEMENT: -can try meditating, or just sitting quietly with deep breathing while intentionally relaxing all parts of your body for 5 minutes daily -if you need further help with stress, anxiety or depression please follow up with your primary doctor or contact the wonderful folks at [place]: [place]: -options in [place] if you wish to engage in more social and exercise related activities:  -Silver sneakers https://tools.silversneakers.com  -Walk with a Doc: http://stephens-thompson.biz/  -Check out the [place] 50+ section on the [place] [employer] (hiking clubs, book clubs, cards and games, chess, exercise classes, aquatic classes and much more) - see the website for details: https://www.[place].gov/departments/parks-recreation/active-adults50  -YouTube has lots of exercise videos for different ages and abilities as well  -[facility] (a variety of indoor and outdoor inperson activities for adults). (680)505-8409. [address]  -Virtual Online Classes (a variety of topics): see seniorplanet.org or call 940-593-3394  -consider volunteering at a school, hospice center, church, senior center or elsewhere

## 2022-04-26 NOTE — Progress Notes (Signed)
PATIENT CHECK-IN and HEALTH RISK ASSESSMENT QUESTIONNAIRE:  -completed by phone/video for upcoming Medicare Preventive Visit  Pre-Visit Check-in: 1)Vitals (height, wt, BP, etc) - record in vitals section for visit on day of visit 2)Review and Update Medications, Allergies PMH, Surgeries, Social history in Epic 3)Hospitalizations in the last year with date/reason? No  4)Review and Update Care Team (patient's specialists) in Epic 5) Complete PHQ9 in Epic  6) Complete Fall Screening in Epic 7)Review all Health Maintenance Due and order under PCP if not done.  8)Medicare Wellness Questionnaire: Answer theses question about your habits: Do you drink alcohol? No  If yes, how many drinks do you have a day?No Have you ever smoked?Yes     Quit date if applicable? Quit 48 years ago  How many packs a day do/did you smoke? Smoked only a few cigs per day Do you use smokeless tobacco?No  Do you use an illicit drugs?No  Do you exercises? Yes    IF so, what type and how many days/minutes per week?Sometime with stationary bike Are you sexually active? No   Number of partners?No Typical breakfast: Smoothie, ensure Typical lunch:don't eat lunch Typical dinner: lean cuisine meal, or salad or leftover   Typical snacks: chocolate candy, potato chips  Beverages: Water with lime, coke, gatorade  Answer theses question about you: Can you perform most household chores?Yes Do you find it hard to follow a conversation in a noisy room?Yes, patient has hearing aids Do you often ask people to speak up or repeat themselves?No  Do you feel that you have a problem with memory?Yes  - feels like is mild and does not feel is an issue Do you balance your checkbook and or bank acounts?Yes  Do you feel safe at home?Yes  Last dentist visit?August 2023 Do you need assistance with any of the following: Please note if so   Driving?-Yes  Feeding yourself?-No   Getting from bed to chair?-No  Getting to the  toilet?-No  Bathing or showering?-No  Dressing yourself?-No   Managing money?-No   Climbing a flight of stairs- Patient has stair lift  Preparing meals?-No   Do you have Advanced Directives in place (Living Will, Healthcare Power or Attorney)? Yes   Last eye Exam and location?June 2023.   Dr[facility] at [facility]   Do you currently use prescribed or non-prescribed narcotic or opioid pain medications?No  Do you have a history or close family history of breast, ovarian, tubal or peritoneal cancer or a family member with BRCA (breast cancer susceptibility 1 and 2) gene mutations?  Nurse/Assistant Credentials/time stamp:St   ----------------------------------------------------------------------------------------------------------------------------------------------------------------------------------------------------------------------   MEDICARE ANNUAL PREVENTIVE VISIT WITH PROVIDER: (Welcome to [employer], initial annual wellness or annual wellness exam)  Virtual Visit via Phone Note  I connected with Taylor Mccall on 04/26/22 by phone and verified that I am speaking with the correct person using two identifiers.  Location patient: home Location provider:work or home office Persons participating in the virtual visit: patient, provider  Concerns and/or follow up today: Reports stable. Has chronic neck pain and just had MRI yesterday and will see her specialist to review this in a few days.    See HM section in Epic for other details of completed HM.    ROS: negative for report of fevers, unintentional weight loss, vision changes, vision loss, hearing loss or change, chest pain, sob, hemoptysis, melena, hematochezia, hematuria, falls recently - now using cane, bleeding or bruising, loc, thoughts of suicide or self harm, memory loss  Patient-completed  extensive health risk assessment - reviewed and discussed with the patient: See Health Risk Assessment completed with  patient prior to the visit either above or in recent phone note. This was reviewed in detailed with the patient today and appropriate recommendations, orders and referrals were placed as needed per Summary below and patient instructions.   Review of Medical History: -PMH, PSH, Family History and current specialty and care providers reviewed and updated and listed below   Patient Care Team: Dorothyann Peng, NP as PCP - General (Family Medicine) Newt Minion, MD as Consulting Physician (Orthopedic Surgery) [facility] (Ophthalmology)   Past Medical History:  Diagnosis Date   Anxiety    Blood transfusion without reported diagnosis    Breast mass, left    Cataract    Depression    Difficult intubation 04/01/2020   Known difficult airway from previous records   Gait disturbance    GERD (gastroesophageal reflux disease)    Hypertension    Lymphedema of both lower extremities    Seeing OT at [place] to Left Lower leg   Obesity    Rheumatoid arthritis(714.0)    Sleep apnea    cpap   Urinary incontinence    Venous insufficiency     Past Surgical History:  Procedure Laterality Date   ABDOMINAL HYSTERECTOMY     with vesicovaginal fistula closure   ANTERIOR HIP REVISION Left 04/01/2020   Procedure: ANTERIOR LEFT HIP REVISION OF ACETABULAR COMPONENT;  Surgeon: Mcarthur Rossetti, MD;  Location: [facility];  Service: Orthopedics;  Laterality: Left;  3E   BLADDER SUSPENSION  2009   sling   BREAST REDUCTION SURGERY     COLONOSCOPY     ESOPHAGOGASTRODUODENOSCOPY     EYE SURGERY     REFRACTIVE SURGERY Right 06/26/2021   REPLACEMENT TOTAL KNEE Left    TOE SURGERY Left    Left great toe   toenail removal Right    all 5 toenails   TOTAL HIP ARTHROPLASTY     TOTAL KNEE ARTHROPLASTY Right 12/09/2013   Procedure: Right Total Knee Arthroplasty;  Surgeon: Newt Minion, MD;  Location: [place];  Service: Orthopedics;  Laterality: Right;   TOTAL KNEE  REVISION Left 11/25/2020   Procedure: LEFT TOTAL KNEE REVISION;  Surgeon: Mcarthur Rossetti, MD;  Location: [facility];  Service: Orthopedics;  Laterality: Left;    Social History   Socioeconomic History   Marital status: Widowed    Spouse name: Not on file   Number of children: Not on file   Years of education: Not on file   Highest education level: 12th grade  Occupational History   Not on file  Tobacco Use   Smoking status: Former    Packs/day: 0.30    Years: 10.00    Additional pack years: 0.00    Total pack years: 3.00    Types: Cigarettes    Quit date: 02/05/1978    Years since quitting: 44.2    Passive exposure: Never   Smokeless tobacco: Never   Tobacco comments:    over 25 years ago...pt doesnt remember when she quit.   Vaping Use   Vaping Use: Never used  Substance and Sexual Activity   Alcohol use: No   Drug use: No   Sexual activity: Not on file  Other Topics Concern   Not on file  Social History Narrative   Not on file   Social Determinants of Health   Financial Resource Strain: Low Risk  (  04/24/2021)   Overall Financial Resource Strain (CARDIA)    Difficulty of Paying Living Expenses: Not hard at all  Food Insecurity: No Food Insecurity (03/19/2022)   Hunger Vital Sign    Worried About Running Out of Food in the Last Year: Never true    Ran Out of Food in the Last Year: Never true  Transportation Needs: Patient Declined (03/19/2022)   PRAPARE - Transportation    Lack of Transportation (Medical): Patient declined    Lack of Transportation (Non-Medical): Patient declined  Physical Activity: Unknown (03/19/2022)   Exercise Vital Sign    Days of Exercise per Week: 0 days    Minutes of Exercise per Session: Not on file  Recent Concern: Physical Activity - Inactive (03/19/2022)   Exercise Vital Sign    Days of Exercise per Week: 0 days    Minutes of Exercise per Session: 30 min  Stress: Stress Concern Present (03/19/2022)   [place]    Feeling of Stress : Rather much  Social Connections: Unknown (03/19/2022)   Social Connection and Isolation Panel [NHANES]    Frequency of Communication with Friends and Family: More than three times a week    Frequency of Social Gatherings with Friends and Family: Once a week    Attends Religious Services: Never    Marine scientist or Organizations: Not on file    Attends Archivist Meetings: Not on file    Marital Status: Widowed  Human resources officer Violence: Not on file    Family History  Problem Relation Age of Onset   Diabetes Other    Stroke Other    Asthma Brother    Heart disease Brother    Coronary artery disease Father    Skin cancer Father    Heart disease Father    Coronary artery disease Brother    Colon cancer Neg Hx    Esophageal cancer Neg Hx    Rectal cancer Neg Hx    Stomach cancer Neg Hx     Current Outpatient Medications on File Prior to Visit  Medication Sig Dispense Refill   ALEVE 220 MG CAPS Take 220-440 mg by mouth 2 (two) times daily as needed (pain.).     ALPRAZolam (XANAX) 0.25 MG tablet Take 1 tablet (0.25 mg total) by mouth 2 (two) times daily as needed for anxiety. 20 tablet 0   aspirin EC 81 MG tablet Take 1 tablet (81 mg total) by mouth 2 (two) times daily. Swallow whole. (Patient taking differently: Take 81 mg by mouth daily. Swallow whole.) 30 tablet 11   azithromycin (ZITHROMAX) 500 MG tablet Take by mouth. (Patient not taking: Reported on 03/27/2022)     brimonidine (ALPHAGAN) 0.2 % ophthalmic solution Place 1 drop into both eyes 2 (two) times daily.     cholecalciferol (VITAMIN D) 25 MCG (1000 UNIT) tablet Take 1,000 Units by mouth daily.     fluticasone (FLONASE) 50 MCG/ACT nasal spray Place 2 sprays into both nostrils daily. 16 g 6   furosemide (LASIX) 20 MG tablet TAKE 1 TABLET BY MOUTH 2 TIMES DAILY. MORNING AND MIDDAY 180 tablet 3   hydroxychloroquine (PLAQUENIL) 200  MG tablet Take 1 tablet (200 mg total) by mouth 2 (two) times daily. 180 tablet 0   KLOR-CON M20 20 MEQ tablet TAKE 2 TABLETS (40 MEQ TOTAL) BY MOUTH EVERY MORNING. 180 tablet 0   latanoprost (XALATAN) 0.005 % ophthalmic solution Place 1 drop into both  eyes at bedtime.     losartan (COZAAR) 25 MG tablet TAKE 1 TABLET (25 MG TOTAL) BY MOUTH DAILY. KEEP APPT FOR REFILLS 90 tablet 0   Multiple Vitamins-Minerals (PRESERVISION AREDS 2+MULTI VIT PO) Take 1 capsule by mouth in the morning and at bedtime.     Omega-3 Fatty Acids (FISH OIL) 1200 MG CAPS Take 2,400 mg by mouth daily.     pantoprazole (PROTONIX) 40 MG tablet TAKE ONE TABLET BY MOUTH ONCE DAILY BEFORE BREAKFAST 90 tablet 1   prednisoLONE acetate (PRED FORTE) 1 % ophthalmic suspension Place into both eyes.     rosuvastatin (CRESTOR) 5 MG tablet TAKE 1 TABLET BY MOUTH EVERYDAY AT BEDTIME 90 tablet 1   sertraline (ZOLOFT) 100 MG tablet TAKE 1 TABLET BY MOUTH EVERY DAY IN THE MORNING 90 tablet 3   timolol (TIMOPTIC) 0.5 % ophthalmic solution SMARTSIG:In Eye(s)     vitamin B-12 (CYANOCOBALAMIN) 500 MCG tablet Take 1,000 mcg by mouth daily. Gummy     vitamin C (ASCORBIC ACID) 250 MG tablet Take 500 mg by mouth daily. (Gummy)     vitamin E 180 MG (400 UNITS) capsule Take 400 Units by mouth daily.     No current facility-administered medications on file prior to visit.    Allergies  Allergen Reactions   Penicillins Rash       Physical Exam There were no vitals filed for this visit. Estimated body mass index is 36.88 kg/m as calculated from the following:   Height as of 04/03/22: 5\' 10"  (1.778 m).   Weight as of 04/03/22: 257 lb (116.6 kg).  EKG (optional): deferred due to virtual visit  GENERAL: alert, oriented, no acute distress detected, full vision exam deferred due to pandemic and/or virtual encounter  Flowsheet Row Video Visit from 04/26/2022 in [place] at [facility]  PHQ-9 Total Score 13            04/26/2022   11:08 AM 09/14/2021    9:12 AM 07/25/2021    7:44 AM 04/24/2021    8:27 AM 09/13/2020    9:25 AM  Depression screen PHQ 2/9  Decreased Interest 3 0 2 0 0  Down, Depressed, Hopeless 0 0 0 0 0  PHQ - 2 Score 3 0 2 0 0  Altered sleeping 3  3  2   Tired, decreased energy 3  3  3   Change in appetite 1  3  0  Feeling bad or failure about yourself  0  0  0  Trouble concentrating 0  0  0  Moving slowly or fidgety/restless 3  0  0  Suicidal thoughts 0  0  0  PHQ-9 Score 13  11  5   Difficult doing work/chores Somewhat difficult  Not difficult at all  Not difficult at all  Mainly due to poor energy - she feels would like to lose some weight and is becoming more active. Currently is dealing with neck pain and is seeing specialist for this so is hopeful this will improve.      04/24/2021    8:26 AM 09/13/2021   10:13 AM 09/14/2021    8:53 AM 03/19/2022    9:09 PM 04/26/2022   11:08 AM  Fall Risk  Falls in the past year? 0 1 1 1 1   Was there an injury with Fall?  1 0 0 1  Was there an injury with Fall? - Comments     bruises  Fall Risk Category Calculator  2 1  2   2   2 3   Fall Risk Category (Retired)  Moderate Low    (RETIRED) Patient Fall Risk Level High fall risk Moderate fall risk Low fall risk    Patient at Risk for Falls Due to Impaired balance/gait;Impaired mobility;Medication side effect Impaired balance/gait History of fall(s)    Fall risk Follow up Falls evaluation completed;Education provided;Falls prevention discussed Education provided Falls evaluation completed       SUMMARY AND PLAN:  Encounter for Medicare annual wellness exam    Discussed applicable health maintenance/preventive health measures and advised and referred or ordered per patient preferences:  She plans to get th shingles vaccine and reports had the latest covid booster in the fall but did not have the date - plans to get the date so that we can update.  Health Maintenance  Topic Date Due   Zoster  Vaccines- Shingrix (1 of 2) Never done   COVID-19 Vaccine (5 - 2023-24 season) 10/06/2021   Medicare Annual Wellness (AWV)  04/26/2023   DTaP/Tdap/Td (4 - Td or Tdap) 11/04/2031   Pneumonia Vaccine 82+ Years old  Completed   INFLUENZA VACCINE  Completed   DEXA SCAN  Completed   Hepatitis C Screening  Completed   HPV VACCINES  Aged Out   COLONOSCOPY (Pts 45-11yrs Insurance coverage will need to be confirmed)  Discontinued    Education and counseling on the following was provided based on the above review of health and a plan/checklist for the patient, along with additional information discussed, was provided for the patient in the patient instructions :  -uses hearing aides - only feels she needs if a lot of background noise -Provided counseling and plan for increased risk of falling if applicable per above screening. She is seeing specialist for her neck and has done PT in the past for legs. Discussed safe balance exercises and how to do safely that she could do at home to improve balance and reduce the chance of falls.  -Advised and counseled on  healthy lifestyle - including the importance of a healthy diet, regular physical activity, social connections and stress management. Discussed dietary changes to improve weight and health as she was interested in this. Discussed a whole foods based healthy diet. Encouraged to try to cut back on processed and sugary foods and discussed alternatives. A summary of a healthy diet was provided in the Patient Instructions. -Recommended regular exercise and discussed options within the community and at home. She has started at the direction of her doctors using a stationary bike. Advised when increasing time or intensity to do so slowly overtime to avoid strain and to listen to her body and stop and consult doctor if any discomfort.  -Advise yearly dental visits at minimum and regular eye exams -Counseling info provided - see patient instruction. No severe  symptoms or thoughts of self harm.  Follow up: see patient instructions     Patient Instructions  I really enjoyed getting to talk with you today! I am available on Tuesdays and Thursdays for virtual visits if you have any questions or concerns, or if I can be of any further assistance.   CHECKLIST FROM ANNUAL WELLNESS VISIT:  -Follow up (please call to schedule if not scheduled after visit):  - -yearly for annual wellness visit with primary care office  Here is a list of your preventive care/health maintenance measures and the plan for each if any are due:  Health Maintenance  Topic Date Due   Zoster  Vaccines- Shingrix (1 of 2) Never done, if you wish to get - can do at the pharmacy.   COVID-19 Vaccine (5 - 2023-24 season) 10/06/2021, please obtain date from you pharmacy so that we can update this.    Medicare Annual Wellness (AWV)  04/26/2023   DTaP/Tdap/Td (4 - Td or Tdap) 11/04/2031   Pneumonia Vaccine 53+ Years old  Completed   INFLUENZA VACCINE  Completed   DEXA SCAN  Completed   Hepatitis C Screening  Completed   HPV VACCINES  Aged Out   COLONOSCOPY (Pts 45-36yrs Insurance coverage will need to be confirmed)  Discontinued    -See a dentist at least yearly  -Get your eyes checked and then per your eye specialist's recommendations  -Other issues addressed today: -  -I have included below further information regarding a healthy whole foods based diet, physical activity guidelines for adults, stress management and opportunities for social connections. I hope you find this information useful.   -----------------------------------------------------------------------------------------------------------------------------------------------------------------------------------------------------------------------------------------------------------  NUTRITION: -eat real food: lots of colorful vegetables (half the plate) and fruits -5-7 servings of vegetables and fruits per day  (fresh or steamed is best), exp. 2 servings of vegetables with lunch and dinner and 2 servings of fruit per day. Berries and greens such as kale and collards are great choices.  -consume on a regular basis: whole grains (make sure first ingredient on label contains the word "whole"), fresh fruits, fish, nuts, seeds, healthy oils (such as olive oil, avocado oil, grape seed oil) -may eat small amounts of dairy and lean meat on occasion, but avoid processed meats such as ham, bacon, lunch meat, etc. -drink water -try to avoid fast food and pre-packaged foods, processed meat -most experts advise limiting sodium to < 2300mg  per day, should limit further is any chronic conditions such as high blood pressure, heart disease, diabetes, etc. The American Heart Association advised that < 1500mg  is is ideal -try to avoid foods that contain any ingredients with names you do not recognize  -try to avoid sugar/sweets (except for the natural sugar that occurs in fresh fruit) -try to avoid sweet drinks -try to avoid white rice, white bread, pasta (unless whole grain), white or yellow potatoes  EXERCISE GUIDELINES FOR ADULTS: -if you wish to increase your physical activity, do so gradually and with the approval of your doctor -STOP and seek medical care immediately if you have any chest pain, chest discomfort or trouble breathing when starting or increasing exercise  -move and stretch your body, legs, feet and arms when sitting for long periods -Physical activity guidelines for optimal health in adults: -least 150 minutes per week of aerobic exercise (can talk, but not sing) once approved by your doctor, 20-30 minutes of sustained activity or two 10 minute episodes of sustained activity every day.  -resistance training at least 2 days per week if approved by your doctor -balance exercises 3+ days per week:   Stand somewhere where you have something sturdy to hold onto if you lose balance.    1) lift up on toes,  start with 5x per day and work up to 20x   2) stand and lift on leg straight out to the side so that foot is a few inches of the floor, start with 5x each side and work up to 20x each side   3) stand on one foot, start with 5 seconds each side and work up to 20 seconds on each side  If you need ideas or help with getting  more active:  -Silver sneakers https://tools.silversneakers.com  -Walk with a Doc: http://stephens-thompson.biz/  -try to include resistance (weight lifting/strength building) and balance exercises twice per week: or the following link for ideas: ChessContest.fr  UpdateClothing.com.cy  STRESS MANAGEMENT: -can try meditating, or just sitting quietly with deep breathing while intentionally relaxing all parts of your body for 5 minutes daily -if you need further help with stress, anxiety or depression please follow up with your primary doctor or contact the wonderful folks at [place]: [place]: -options in [place] if you wish to engage in more social and exercise related activities:  -Silver sneakers https://tools.silversneakers.com  -Walk with a Doc: http://stephens-thompson.biz/  -Check out the [place] 50+ section on the [employer] (hiking clubs, book clubs, cards and games, chess, exercise classes, aquatic classes and much more) - see the website for details: https://www.[place].gov/departments/parks-recreation/active-adults50  -YouTube has lots of exercise videos for different ages and abilities as well  -[place] (a variety of indoor and outdoor inperson activities for adults). 319-319-7204. [address]  -Virtual Online Classes (a variety of topics): see seniorplanet.org or call 636-109-6407  -consider volunteering at a school, hospice center, church, senior center or  elsewhere           Lucretia Kern, DO

## 2022-05-01 ENCOUNTER — Encounter: Payer: Self-pay | Admitting: Orthopaedic Surgery

## 2022-05-01 ENCOUNTER — Ambulatory Visit (INDEPENDENT_AMBULATORY_CARE_PROVIDER_SITE_OTHER): Payer: Medicare HMO | Admitting: Orthopaedic Surgery

## 2022-05-01 VITALS — BP 135/82 | Ht 70.0 in | Wt 256.0 lb

## 2022-05-01 DIAGNOSIS — M4802 Spinal stenosis, cervical region: Secondary | ICD-10-CM | POA: Diagnosis not present

## 2022-05-02 DIAGNOSIS — M4802 Spinal stenosis, cervical region: Secondary | ICD-10-CM | POA: Insufficient documentation

## 2022-05-02 NOTE — Progress Notes (Signed)
Office Visit Note   Patient: Taylor Mccall           Date of Birth: Jan 15, 1946           MRN: [id number] Visit Date: 05/01/2022              Requested by: Dorothyann Peng, NP [place],  [place] [id number] PCP: Dorothyann Peng, NP   Assessment & Plan: Visit Diagnoses:  1. Spinal stenosis of cervical region     Plan: Patient with progressive severe stenosis C3-4 C5-6 without cord changes.  Persistent problems with neck pain upper extremity numbness gait disturbance.  We discussed surgery for her stenosis.  Plan would be a two-level cervical fusion at C3-4 and C5-6 leaving the level in between at C4-5 alone since it appears to be autofused in.  She has significant bone disc osteophytes causing compression on the cord at C3-4 and also C5-6.  Plan overnight stay soft collar x 6 weeks.  Risk surgery discussed.  Daughter was present for the discussion she understands and agrees to proceed.  Follow-Up Instructions: No follow-ups on file.   Orders:  No orders of the defined types were placed in this encounter.  No orders of the defined types were placed in this encounter.     Procedures: No procedures performed   Clinical Data: No additional findings.   Subjective: Chief Complaint  Patient presents with   Neck - Pain, Follow-up    MRI review    HPI 77 year old female with autoimmune disease followed by Dr. Estanislado Pandy on Plaquenil seen with chronic neck pain, cervical spinal stenosis and gait disturbance.  Complex history with a slipped capital femoral process as a young girl on the right hip.  Left hip total of arthroplasty Dr. Maryjean Ka later revision by Dr. Ninfa Linden.  She has had right total knee arthroplasty Dr. Sharol Given 2015 left total knee revision by Dr. Ninfa Linden 2022.  She has had neck pain numbness and tingling in her hands pain on the right side of her neck that radiates into her shoulders.  Balance problems gait disturbance has been using a cane for  ambulation.  Previous MRI scan showed moderate to severe stenosis at C3-4 and C4-5 without cord changes.  Due to increased gait problems and balance problems new MRI was obtained 04/25/2022 which shows some progression of stenosis with narrowing at C3-4 down to 5 mm AP diameter with severe left foraminal stenosis and narrowing at C5-6 down to 4 to 5 mm AP diameter which had progressed without cord changes.  She appeared to autofused at C4-5 with bone bridging across the disc space and has some mild stenosis at C3 4-5.  Neck pain hand numbness and gait disturbance have been her principal problems.  Review of Systems history of multiple hip and knee procedures.  Autoimmune disease on Plaquenil.  Negative for bowel bladder disturbance no chills or fever.  All systems noncontributory to HPI.   Objective: Vital Signs: BP 135/82   Ht 5\' 10"  (1.778 m)   Wt 256 lb (116.1 kg)   BMI 36.73 kg/m   Physical Exam Constitutional:      Appearance: She is well-developed.  HENT:     Head: Normocephalic.     Right Ear: External ear normal.     Left Ear: External ear normal. There is no impacted cerumen.  Eyes:     Pupils: Pupils are equal, round, and reactive to light.  Neck:     Thyroid: No thyromegaly.  Trachea: No tracheal deviation.  Cardiovascular:     Rate and Rhythm: Normal rate.  Pulmonary:     Effort: Pulmonary effort is normal.  Abdominal:     Palpations: Abdomen is soft.  Musculoskeletal:     Cervical back: No rigidity.  Skin:    General: Skin is warm and dry.  Neurological:     Mental Status: She is alert and oriented to person, place, and time.  Psychiatric:        Behavior: Behavior normal.     Ortho Exam patient with bilateral brachial plexus tenderness.  Negative Lhermitte.  Upper extremity reflexes are 2+ and symmetrical lower extremity 3+ but no clonus.  She has wide-based gait balance problems ambulating with a cane.  Specialty Comments:  No specialty comments  available.  Imaging: No results found.   PMFS History: Patient Active Problem List   Diagnosis Date Noted   Spinal stenosis of cervical region 05/02/2022   Status post revision of total knee replacement, left 11/25/2020   Failed total knee, left, subsequent encounter 11/24/2020   Status post revision of total hip 04/01/2020   Polyethylene liner wear following total hip arthroplasty requiring isolated polyethylene liner exchange ([place]) 03/31/2020   Intermediate stage nonexudative age-related macular degeneration of right eye 12/23/2019   Exudative age-related macular degeneration of left eye with inactive choroidal neovascularization ([place]) 12/23/2019   Primary open angle glaucoma of both eyes, moderate stage 12/23/2019   Exudative retinopathy of left eye 12/23/2019   Routine general medical examination at a health care facility 12/11/2016   Primary osteoarthritis of left hip 05/01/2016   Autoimmune disease ([place]) 11/29/2015   High risk medication use 11/29/2015   Vitamin D deficiency 11/29/2015   Macular degeneration 11/29/2015   Bilateral lower extremity edema 10/26/2015   H/O total knee replacement, bilateral 12/09/2013   Breast mass, left 01/18/2011   Obesity 12/08/2009   Venous (peripheral) insufficiency 07/28/2009   PAIN IN JOINT, ANKLE AND FOOT 01/12/2008   Obstructive sleep apnea 08/29/2007   GAIT DISTURBANCE 08/14/2007   Depression 08/26/2006   Essential hypertension 08/26/2006   Primary osteoarthritis of both hands 08/26/2006   Urinary incontinence 08/26/2006   Past Medical History:  Diagnosis Date   Anxiety    Blood transfusion without reported diagnosis    Breast mass, left    Cataract    Depression    Difficult intubation 04/01/2020   Known difficult airway from previous records   Gait disturbance    GERD (gastroesophageal reflux disease)    Hypertension    Lymphedema of both lower extremities    Seeing OT at [facility] to Left  Lower leg   Obesity    Rheumatoid arthritis(714.0)    Sleep apnea    cpap   Urinary incontinence    Venous insufficiency     Family History  Problem Relation Age of Onset   Diabetes Other    Stroke Other    Asthma Brother    Heart disease Brother    Coronary artery disease Father    Skin cancer Father    Heart disease Father    Coronary artery disease Brother    Colon cancer Neg Hx    Esophageal cancer Neg Hx    Rectal cancer Neg Hx    Stomach cancer Neg Hx     Past Surgical History:  Procedure Laterality Date   ABDOMINAL HYSTERECTOMY     with vesicovaginal fistula closure   ANTERIOR HIP REVISION Left 04/01/2020  Procedure: ANTERIOR LEFT HIP REVISION OF ACETABULAR COMPONENT;  Surgeon: Mcarthur Rossetti, MD;  Location: [facility];  Service: Orthopedics;  Laterality: Left;  3E   BLADDER SUSPENSION  2009   sling   BREAST REDUCTION SURGERY     COLONOSCOPY     ESOPHAGOGASTRODUODENOSCOPY     EYE SURGERY     REFRACTIVE SURGERY Right 06/26/2021   REPLACEMENT TOTAL KNEE Left    TOE SURGERY Left    Left great toe   toenail removal Right    all 5 toenails   TOTAL HIP ARTHROPLASTY     TOTAL KNEE ARTHROPLASTY Right 12/09/2013   Procedure: Right Total Knee Arthroplasty;  Surgeon: Newt Minion, MD;  Location: [facility];  Service: Orthopedics;  Laterality: Right;   TOTAL KNEE REVISION Left 11/25/2020   Procedure: LEFT TOTAL KNEE REVISION;  Surgeon: Mcarthur Rossetti, MD;  Location: [facility];  Service: Orthopedics;  Laterality: Left;   Social History   Occupational History   Not on file  Tobacco Use   Smoking status: Former    Packs/day: 0.30    Years: 10.00    Additional pack years: 0.00    Total pack years: 3.00    Types: Cigarettes    Quit date: 02/05/1978    Years since quitting: 44.2    Passive exposure: Never   Smokeless tobacco: Never   Tobacco comments:    over 25 years ago...pt doesnt remember when she quit.   Vaping Use   Vaping Use: Never used  Substance  and Sexual Activity   Alcohol use: No   Drug use: No   Sexual activity: Not on file

## 2022-05-02 NOTE — Progress Notes (Signed)
Office Visit Note  Patient: Taylor Mccall             Date of Birth: [birth year]           MRN: [id number]             PCP: Dorothyann Peng, NP Referring: Dorothyann Peng, NP Visit Date: 05/08/2022 Occupation: @GUAROCC @  Subjective:  Neck pain  History of Present Illness: Taylor Mccall is a 77 y.o. female with history of autoimmune disease, osteoarthritis and degenerative disc disease.  She states she continues to have pain and discomfort in her cervical spine.  She saw Dr. Lorin Mercy for her neck pain.  He recommended cervical spine fusion.  She continues to have some discomfort in her left hip and both knee joints which are replaced.  Shoulder pain is better but she has limited range of motion.  She has not seen any joint swelling but she continues to have swelling in her legs.  Patient states that she was recently evaluated by Dr. Sherryle Lis at [facility] ophthalmology who recommended her to come off hydroxychloroquine due to history of macular degeneration.  Patient states that she might not undergo cervical spine fusion in the next few weeks.  She would like to continue hydroxychloroquine until her follow-up visit and will consider starting on a new medication at the follow-up visit.    Activities of Daily Living:  Patient reports morning stiffness for 30 minutes.   Patient Reports nocturnal pain.  Difficulty dressing/grooming: Reports Difficulty climbing stairs: Reports Difficulty getting out of chair: Reports Difficulty using hands for taps, buttons, cutlery, and/or writing: Reports  Review of Systems  Constitutional:  Positive for fatigue.  HENT:  Positive for mouth dryness. Negative for mouth sores.   Eyes:  Positive for dryness.  Respiratory:  Positive for shortness of breath.   Cardiovascular:  Negative for chest pain and palpitations.  Gastrointestinal:  Negative for blood in stool, constipation and diarrhea.  Endocrine: Positive for increased urination.  Genitourinary:   Positive for involuntary urination.  Musculoskeletal:  Positive for joint pain, gait problem, joint pain, joint swelling and morning stiffness. Negative for myalgias, muscle weakness, muscle tenderness and myalgias.  Skin:  Negative for color change, rash, hair loss and sensitivity to sunlight.  Allergic/Immunologic: Negative for susceptible to infections.  Neurological:  Positive for headaches. Negative for dizziness.  Hematological:  Positive for swollen glands.  Psychiatric/Behavioral:  Positive for depressed mood and sleep disturbance. The patient is nervous/anxious.     PMFS History:  Patient Active Problem List   Diagnosis Date Noted   Spinal stenosis of cervical region 05/02/2022   Status post revision of total knee replacement, left 11/25/2020   Failed total knee, left, subsequent encounter 11/24/2020   Status post revision of total hip 04/01/2020   Polyethylene liner wear following total hip arthroplasty requiring isolated polyethylene liner exchange 03/31/2020   Intermediate stage nonexudative age-related macular degeneration of right eye 12/23/2019   Exudative age-related macular degeneration of left eye with inactive choroidal neovascularization 12/23/2019   Primary open angle glaucoma of both eyes, moderate stage 12/23/2019   Exudative retinopathy of left eye 12/23/2019   Routine general medical examination at a health care facility 12/11/2016   Primary osteoarthritis of left hip 05/01/2016   Autoimmune disease 11/29/2015   High risk medication use 11/29/2015   Vitamin D deficiency 11/29/2015   Macular degeneration 11/29/2015   Bilateral lower extremity edema 10/26/2015   H/O total knee replacement, bilateral 12/09/2013   Breast mass,  left 01/18/2011   Obesity 12/08/2009   Venous (peripheral) insufficiency 07/28/2009   PAIN IN JOINT, ANKLE AND FOOT 01/12/2008   Obstructive sleep apnea 08/29/2007   GAIT DISTURBANCE 08/14/2007   Depression 08/26/2006   Essential  hypertension 08/26/2006   Primary osteoarthritis of both hands 08/26/2006   Urinary incontinence 08/26/2006    Past Medical History:  Diagnosis Date   Anxiety    Blood transfusion without reported diagnosis    Breast mass, left    Cataract    Depression    Difficult intubation 04/01/2020   Known difficult airway from previous records   Gait disturbance    GERD (gastroesophageal reflux disease)    Hypertension    Lymphedema of both lower extremities    Seeing OT at [place] to Left Lower leg   Obesity    Rheumatoid arthritis(714.0)    Sleep apnea    cpap   Urinary incontinence    Venous insufficiency     Family History  Problem Relation Age of Onset   Coronary artery disease Father    Skin cancer Father    Heart disease Father    Asthma Brother    Heart disease Brother    Coronary artery disease Brother    Diabetes Other    Stroke Other    Colon cancer Neg Hx    Esophageal cancer Neg Hx    Rectal cancer Neg Hx    Stomach cancer Neg Hx    Past Surgical History:  Procedure Laterality Date   ABDOMINAL HYSTERECTOMY     with vesicovaginal fistula closure   ANTERIOR HIP REVISION Left 04/01/2020   Procedure: ANTERIOR LEFT HIP REVISION OF ACETABULAR COMPONENT;  Surgeon: Mcarthur Rossetti, MD;  Location: [facility];  Service: Orthopedics;  Laterality: Left;  3E   BLADDER SUSPENSION  2009   sling   BREAST REDUCTION SURGERY     COLONOSCOPY     ESOPHAGOGASTRODUODENOSCOPY     EYE SURGERY     REFRACTIVE SURGERY Right 06/26/2021   REPLACEMENT TOTAL KNEE Left    TOE SURGERY Left    Left great toe   toenail removal Right    all 5 toenails   TOTAL HIP ARTHROPLASTY     TOTAL KNEE ARTHROPLASTY Right 12/09/2013   Procedure: Right Total Knee Arthroplasty;  Surgeon: Newt Minion, MD;  Location: [place];  Service: Orthopedics;  Laterality: Right;   TOTAL KNEE REVISION Left 11/25/2020   Procedure: LEFT TOTAL KNEE REVISION;  Surgeon: Mcarthur Rossetti, MD;  Location: [facility];  Service: Orthopedics;  Laterality: Left;   Social History   Social History Narrative   Not on file   Immunization History  Administered Date(s) Administered   Fluad Quad(high Dose 65+) 11/03/2021   Influenza Split 12/14/2010, 12/06/2012   Influenza Whole 02/06/2004, 12/08/2009   Influenza, High Dose Seasonal PF 10/26/2015, 12/11/2016, 01/06/2018, 10/07/2018, 11/15/2020   Influenza,inj,Quad PF,6+ Mos 10/15/2013, 11/19/2014   Influenza-Unspecified 10/07/2018   PFIZER(Purple Top)SARS-COV-2 Vaccination 03/01/2019, 03/21/2019, 10/06/2019   Pfizer Covid-19 Vaccine Bivalent Booster 78yrs & up 11/15/2020   Pneumococcal Conjugate-13 03/18/2013   Pneumococcal Polysaccharide-23 06/30/2008, 10/26/2015   Td 02/05/2005   Tdap 12/14/2010, 11/03/2021     Objective: Vital Signs: BP 111/75 (BP Location: Left Arm, Patient Position: Sitting, Cuff Size: Normal)   Pulse 73   Resp 12   Ht 5' 9.5" (1.765 m)   Wt 256 lb (116.1 kg)   BMI 37.26 kg/m    Physical Exam Vitals  and nursing note reviewed.  Constitutional:      Appearance: She is well-developed.  HENT:     Head: Normocephalic and atraumatic.  Eyes:     Conjunctiva/sclera: Conjunctivae normal.  Cardiovascular:     Rate and Rhythm: Normal rate and regular rhythm.     Heart sounds: Normal heart sounds.  Pulmonary:     Effort: Pulmonary effort is normal.     Breath sounds: Normal breath sounds.  Abdominal:     General: Bowel sounds are normal.     Palpations: Abdomen is soft.  Musculoskeletal:     Cervical back: Normal range of motion.  Lymphadenopathy:     Cervical: No cervical adenopathy.  Skin:    General: Skin is warm and dry.     Capillary Refill: Capillary refill takes less than 2 seconds.  Neurological:     Mental Status: She is alert and oriented to person, place, and time.  Psychiatric:        Behavior: Behavior normal.      Musculoskeletal Exam: Very limited lateral rotation  of the cervical spine.  Limited flexion and extension.  Left shoulder abduction was limited to 90 degrees which was painful.  Right shoulder joint abduction was about 140 degrees.  Elbow joints, wrist joints, MCPs PIPs and DIPs were in good range of motion with no synovitis.  Mild PIP and DIP thickening was noted.  Hip joints were difficult to assess in the sitting position.  Knee joints were in good range of motion.  She had bilateral lower extremity edema.  There was no tenderness over ankles or MTPs.  CDAI Exam: CDAI Score: -- Patient Global: --; Provider Global: -- Swollen: --; Tender: -- Joint Exam 05/08/2022   No joint exam has been documented for this visit   There is currently no information documented on the homunculus. Go to the Rheumatology activity and complete the homunculus joint exam.  Investigation: No additional findings.  Imaging: MR Cervical Spine w/o contrast  Result Date: 04/29/2022 CLINICAL DATA:  Initial evaluation for chronic and progressive neck pain with extension into the shoulders. EXAM: MRI CERVICAL SPINE WITHOUT CONTRAST TECHNIQUE: Multiplanar, multisequence MR imaging of the cervical spine was performed. No intravenous contrast was administered. COMPARISON:  Prior MRI from 11/12/2019. FINDINGS: Alignment: Examination degraded by motion artifact. Straightening with reversal of the normal upper cervical lordosis. Trace anterolisthesis of C2 on C3. Underlying dextroscoliosis. Appearance is similar to prior. Vertebrae: Vertebral body height maintained without acute or chronic fracture. Bone marrow signal intensity within normal limits. No discrete or worrisome osseous lesions. Reactive marrow edema present about the right C1-2 articulation due to osteo arthritis (series 4, image 4). No other abnormal marrow edema. Cord: Normal signal and morphology. Posterior Fossa, vertebral arteries, paraspinal tissues: Visualized brain and posterior fossa within normal limits.  Craniocervical junction normal. Paraspinous soft tissues within normal limits. Normal flow voids seen within the vertebral arteries bilaterally. Disc levels: C2-C3: Central disc protrusion indents the ventral thecal sac, contacting and mildly flattening the ventral cord. No cord signal changes. Superimposed mild facet degeneration. Resultant mild spinal stenosis. Foramina remain patent. Appearance is relatively stable. C3-C4: Advanced degenerative intervertebral disc space narrowing with diffuse disc osteophyte complex. Broad posterior component flattens and indents the ventral thecal sac. Secondary cord flattening without cord signal changes. Severe spinal stenosis, with the thecal sac measuring 5 mm in AP diameter, similar to prior. Severe bilateral C4 foraminal stenosis. Changes at this level have mildly progressed. C4-C5: Degenerative intervertebral disc space narrowing.  Left paracentral disc osteophyte complex indents the ventral thecal sac, contacting and flattening the left hemi cord (series 6, image 12). No cord signal changes. Mild spinal stenosis. Foramina remain patent. Appearance is similar. C5-C6: Degenerative intervertebral disc space narrowing. Right paracentral disc osteophyte complex contacts and flattens the right ventral cord (series 5, image 16). No cord signal changes. Resultant severe spinal stenosis with the thecal sac measuring 4-5 mm in AP diameter, slightly progressed and worsened. Foramina remain adequately patent. C6-C7: Degenerative intervertebral disc space narrowing. Central disc osteophyte complex contacts and flattens the ventral cord. No cord signal changes. Superimposed left-sided facet degeneration. Moderate spinal stenosis. Foramina remain patent. C7-T1: Degenerative intervertebral disc space narrowing with mild disc bulge. Superimposed tiny right paracentral disc protrusion with annular fissure (series 5, image 23). Moderate left worse than right facet arthrosis. Mild ligament  flavum hypertrophy. Resultant mild spinal stenosis. Moderate left with mild right C8 foraminal narrowing. IMPRESSION: 1. Multilevel cervical spondylosis with resultant diffuse spinal stenosis at C2-3 through C7-T1, severe at the C3-4 and C5-6 levels. Overall, appearance is mildly progressed as compared to previous MRI from 11/12/2019. 2. Multifactorial degenerative changes with resultant multilevel foraminal narrowing as above. Notable findings include severe bilateral C4 foraminal stenosis, with moderate left and mild right C8 foraminal narrowing. 3. Reactive marrow edema about the right C1-2 articulation due to osteoarthritis. Finding could serve as a source for neck pain. Electronically Signed   By: Jeannine Boga M.D.   On: 04/29/2022 04:27    Recent Labs: Lab Results  Component Value Date   WBC 3.9 12/04/2021   HGB 11.9 12/04/2021   PLT 226 12/04/2021   NA 143 12/04/2021   K 4.1 12/04/2021   CL 106 12/04/2021   CO2 28 12/04/2021   GLUCOSE 90 12/04/2021   BUN 13 12/04/2021   CREATININE 0.87 12/04/2021   BILITOT 0.9 12/04/2021   ALKPHOS 110 09/14/2021   AST 14 12/04/2021   ALT 11 12/04/2021   PROT 6.8 12/04/2021   ALBUMIN 4.2 09/14/2021   CALCIUM 10.0 12/04/2021   GFRAA 98 07/26/2020    Speciality Comments: PLQ eye exam: 12/11/2021 WNL @ [facility]. MTX in the past-dcd due to OU, hair loss  Procedures:  No procedures performed Allergies: Penicillins   Assessment / Plan:     Visit Diagnoses: Autoimmune disease - +ANA, +RNP, history of inflammatory arthritis.  She is on hydroxychloroquine 200 mg p.o. twice daily.  Patient states that she was recently evaluated by DrWow at [facility].  She was advised to come off hydroxychloroquine as she has macular degeneration.  Patient states if she stopped hydroxychloroquine she starts having more joint pain and inflammation.  No synovitis was noted on the examination.  Her labs have been quiet for a while.  I had a  detailed discussion with patient regarding different treatment options.  I discussed the possible use of leflunomide at a low dose of 10 mg p.o. daily.  Indications side effects contraindications were discussed.  A handout was given.  Patient wants to hold off switching medication until her cervical spine surgery.  She states she will be scheduled for cervical spine surgery within the next month. -Labs obtained on December 04, 2021 ANA was positive, double-stranded DNA negative, complements normal, sed rate normal.  Plan: Protein / creatinine ratio, urine, Anti-DNA antibody, double-stranded, RNP antibody, C3 and C4, Sedimentation rate  High risk medication use - Hydroxychloroquine 200 mg p.o. twice daily.  Last eye examination at [facility] ophthalmology was November 2023. -  CBC and CMP were normal on December 04, 2021.  Plan: CBC with Differential/Platelet, COMPLETE METABOLIC PANEL WITH GFR  Primary osteoarthritis of both hands - Subluxation of several PIPs and DIPs.  No synovitis was noted.  Patient states she has had a lot of inflammation in the past and she gets inflammation in her joints when she gets off Plaquenil.  Chronic left shoulder pain -she continues to have an discomfort in her left shoulder.  Limited abduction and internal rotation.  Carpal tunnel syndrome, right upper limb  S/P total left hip arthroplasty - Revision February 2022 by Dr. Ninfa Linden.  She has chronic discomfort.  H/O total knee replacement, bilateral -she complains of discomfort in her knee joints.  She had revision October 2022.  She ambulates with the help of a walker.  She was referred to PT for lower extremity muscle strengthening.  DDD (degenerative disc disease), cervical -severe multilevel spinal stenosis.  Followed by Dr. Lorin Mercy.  She will be scheduled for surgery in the next few weeks.  DDD (degenerative disc disease), lumbar-she continues to have chronic pain and discomfort.  Vitamin D deficiency-vitamin D was 32 on  February 03, 2021.  Lymphedema-she has significant bump D-mine pedal edema on her lower extremities.  Pedal edema  History of macular degeneration - left eye.  Followed by Dr. Sherryle Lis at [facility] ophthalmology.  We plan to switch her to leflunomide at the follow-up visit after the cervical spine surgery.  History of glaucoma  History of depression  History of sleep apnea  Orders: Orders Placed This Encounter  Procedures   CBC with Differential/Platelet   COMPLETE METABOLIC PANEL WITH GFR   Protein / creatinine ratio, urine   Anti-DNA antibody, double-stranded   C3 and C4   Sedimentation rate   RNP Antibody   No orders of the defined types were placed in this encounter.    Follow-Up Instructions: Return in about 3 months (around 08/07/2022) for Autoimmune disease, Osteoarthritis.   Bo Merino, MD  Note - This record has been created using Editor, commissioning.  Chart creation errors have been sought, but may not always  have been located. Such creation errors do not reflect on  the standard of medical care.

## 2022-05-08 ENCOUNTER — Encounter: Payer: Self-pay | Admitting: Rheumatology

## 2022-05-08 ENCOUNTER — Ambulatory Visit: Payer: Medicare HMO | Attending: Rheumatology | Admitting: Rheumatology

## 2022-05-08 VITALS — BP 111/75 | HR 73 | Resp 12 | Ht 69.5 in | Wt 256.0 lb

## 2022-05-08 DIAGNOSIS — Z8669 Personal history of other diseases of the nervous system and sense organs: Secondary | ICD-10-CM

## 2022-05-08 DIAGNOSIS — M19041 Primary osteoarthritis, right hand: Secondary | ICD-10-CM

## 2022-05-08 DIAGNOSIS — M359 Systemic involvement of connective tissue, unspecified: Secondary | ICD-10-CM | POA: Diagnosis not present

## 2022-05-08 DIAGNOSIS — R600 Localized edema: Secondary | ICD-10-CM

## 2022-05-08 DIAGNOSIS — M5030 Other cervical disc degeneration, unspecified cervical region: Secondary | ICD-10-CM

## 2022-05-08 DIAGNOSIS — Z79899 Other long term (current) drug therapy: Secondary | ICD-10-CM

## 2022-05-08 DIAGNOSIS — I890 Lymphedema, not elsewhere classified: Secondary | ICD-10-CM

## 2022-05-08 DIAGNOSIS — M25512 Pain in left shoulder: Secondary | ICD-10-CM | POA: Diagnosis not present

## 2022-05-08 DIAGNOSIS — M5136 Other intervertebral disc degeneration, lumbar region: Secondary | ICD-10-CM

## 2022-05-08 DIAGNOSIS — E559 Vitamin D deficiency, unspecified: Secondary | ICD-10-CM

## 2022-05-08 DIAGNOSIS — Z8659 Personal history of other mental and behavioral disorders: Secondary | ICD-10-CM

## 2022-05-08 DIAGNOSIS — Z96642 Presence of left artificial hip joint: Secondary | ICD-10-CM

## 2022-05-08 DIAGNOSIS — G8929 Other chronic pain: Secondary | ICD-10-CM

## 2022-05-08 DIAGNOSIS — M19042 Primary osteoarthritis, left hand: Secondary | ICD-10-CM

## 2022-05-08 DIAGNOSIS — Z96653 Presence of artificial knee joint, bilateral: Secondary | ICD-10-CM

## 2022-05-08 DIAGNOSIS — G5601 Carpal tunnel syndrome, right upper limb: Secondary | ICD-10-CM

## 2022-05-08 NOTE — Patient Instructions (Addendum)
Leflunomide Tablets What is this medication? LEFLUNOMIDE (le FLOO na mide) treats the symptoms of rheumatoid arthritis. It works by slowing down an overactive immune system. This decreases inflammation. It belongs to a group of medications called DMARDs. This medicine may be used for other purposes; ask your health care provider or pharmacist if you have questions. COMMON BRAND NAME(S): Arava What should I tell my care team before I take this medication? They need to know if you have any of these conditions: Cancer Diabetes High blood pressure Immune system problems Infection Kidney disease Liver disease Low blood cell levels (white cells, red cells, and platelets) Lung or breathing disease, such as asthma or COPD Recent or upcoming vaccine Skin conditions Tingling of the fingers or toes, or other nerve disorder An unusual or allergic reaction to leflunomide, other medications, food, dyes, or preservatives Pregnant or trying to get pregnant Breastfeeding How should I use this medication? Take this medication by mouth with a full glass of water. Take it as directed on the prescription label at the same time every day. Keep taking it unless your care team tells you to stop. Talk to your care team about the use of this medication in children. Special care may be needed. Overdosage: If you think you have taken too much of this medicine contact a poison control center or emergency room at once. NOTE: This medicine is only for you. Do not share this medicine with others. What if I miss a dose? If you miss a dose, take it as soon as you can. If it is almost time for your next dose, take only that dose. Do not take double or extra doses. What may interact with this medication? Do not take this medication with any of the following: Teriflunomide This medication may also interact with the following: Alosetron Caffeine Cefaclor Certain medications for diabetes, such as nateglinide,  repaglinide, rosiglitazone, pioglitazone Certain medications for high cholesterol, such as atorvastatin, pravastatin, rosuvastatin, simvastatin Charcoal Cholestyramine Ciprofloxacin Duloxetine Estrogen and progestin hormones Furosemide Ketoprofen Live virus vaccines Medications that increase your risk for infection Methotrexate Mitoxantrone Paclitaxel Penicillin Theophylline Tizanidine Warfarin This list may not describe all possible interactions. Give your health care provider a list of all the medicines, herbs, non-prescription drugs, or dietary supplements you use. Also tell them if you smoke, drink alcohol, or use illegal drugs. Some items may interact with your medicine. What should I watch for while using this medication? Visit your care team for regular checks on your progress. Tell your care team if your symptoms do not start to get better or if they get worse. You may need blood work done while you are taking this medication. This medication may cause serious skin reactions. They can happen weeks to months after starting the medication. Contact your care team right away if you notice fevers or flu-like symptoms with a rash. The rash may be red or purple and then turn into blisters or peeling of the skin. You may also notice a red rash with swelling of the face, lips, or lymph nodes in your neck or under your arms. You should not receive certain vaccines during your treatment and for a certain time after your treatment with this medication ends. Talk to your care team for more information. This medication may stay in your body for up to 2 years after your last dose. Tell your care team about any unusual side effects or symptoms. A medication can be given to help lower your blood levels of  this medication more quickly. Talk to your care team if you may be pregnant. This medication can cause serious birth defects if taken during pregnancy and for a while after the last dose. You will  need a negative pregnancy test before starting this medication. Contraception is recommended while taking this medication and for a while after the last dose. Your care team can help you find the option that works for you. Do not breastfeed while taking this medication. What side effects may I notice from receiving this medication? Side effects that you should report to your care team as soon as possible: Allergic reactions--skin rash, itching, hives, swelling of the face, lips, tongue, or throat Dry cough, shortness of breath or trouble breathing Increase in blood pressure Infection--fever, chills, cough, sore throat, wounds that don't heal, pain or trouble when passing urine, general feeling of discomfort or being unwell Redness, blistering, peeling, or loosening of the skin, including inside the mouth Liver injury--right upper belly pain, loss of appetite, nausea, light-colored stool, dark yellow or brown urine, yellowing skin or eyes, unusual weakness or fatigue Pain, tingling, or numbness in the hands or feet Unusual bruising or bleeding Side effects that usually do not require medical attention (report to your care team if they continue or are bothersome): Back pain Diarrhea Hair loss Headache Nausea This list may not describe all possible side effects. Call your doctor for medical advice about side effects. You may report side effects to FDA at 1-800-FDA-1088. Where should I keep my medication? Keep out of the reach of children and pets. Store at room temperature between 20 and 25 degrees C (68 and 77 degrees F). Protect from moisture and light. Keep the container tightly closed. Get rid of any unused medication after the expiration date. To get rid of medications that are no longer needed or have expired: Take the medication to a medication take-back program. Check with your pharmacy or law enforcement to find a location. If you cannot return the medication, ask your pharmacist or  care team how to get rid of this medication safely. NOTE: This sheet is a summary. It may not cover all possible information. If you have questions about this medicine, talk to your doctor, pharmacist, or health care provider.  2023 Elsevier/Gold Standard (2021-06-20 00:00:00)  Standing Labs We placed an order today for your standing lab work.   Please have your standing labs drawn in 50month and then every 3 months  Please have your labs drawn 2 weeks prior to your appointment so that the provider can discuss your lab results at your appointment, if possible.  Please note that you may see your imaging and lab results in [place] before we have reviewed them. We will contact you once all results are reviewed. Please allow our office up to 72 hours to thoroughly review all of the results before contacting the office for clarification of your results.  WALK-IN LAB HOURS  Monday through Thursday from 8:00 am -12:30 pm and 1:00 pm-5:00 pm and Friday from 8:00 am-12:00 pm.  Patients with office visits requiring labs will be seen before walk-in labs.  You may encounter longer than normal wait times. Please allow additional time. Wait times may be shorter on  Monday and Thursday afternoons.  We do not book appointments for walk-in labs. We appreciate your patience and understanding with our staff.   Labs are drawn by [facility]. Please bring your co-pay at the time of your lab draw.  You may receive a bill  from [facility] for your lab work.  Please note if you are on Hydroxychloroquine and and an order has been placed for a Hydroxychloroquine level,  you will need to have it drawn 4 hours or more after your last dose.  If you wish to have your labs drawn at another location, please call the office 24 hours in advance so we can fax the orders.  The office is located at [address] [place], [place], [place] [address]

## 2022-05-09 LAB — SEDIMENTATION RATE: Sed Rate: 17 mm/h (ref 0–30)

## 2022-05-09 LAB — COMPLETE METABOLIC PANEL WITH GFR
AG Ratio: 1.3 (calc) (ref 1.0–2.5)
ALT: 8 U/L (ref 6–29)
AST: 13 U/L (ref 10–35)
Albumin: 4 g/dL (ref 3.6–5.1)
Alkaline phosphatase (APISO): 102 U/L (ref 37–153)
BUN: 16 mg/dL (ref 7–25)
CO2: 27 mmol/L (ref 20–32)
Calcium: 9.6 mg/dL (ref 8.6–10.4)
Chloride: 106 mmol/L (ref 98–110)
Creat: 0.73 mg/dL (ref 0.60–1.00)
Globulin: 3 g/dL (calc) (ref 1.9–3.7)
Glucose, Bld: 89 mg/dL (ref 65–99)
Potassium: 4.4 mmol/L (ref 3.5–5.3)
Sodium: 141 mmol/L (ref 135–146)
Total Bilirubin: 0.6 mg/dL (ref 0.2–1.2)
Total Protein: 7 g/dL (ref 6.1–8.1)
eGFR: 84 mL/min/{1.73_m2} (ref >=60)

## 2022-05-09 LAB — CBC WITH DIFFERENTIAL/PLATELET
Absolute Monocytes: 488 cells/uL (ref 200–950)
Basophils Absolute: 80 cells/uL (ref 0–200)
Basophils Relative: 1.5 %
Eosinophils Absolute: 360 cells/uL (ref 15–500)
Eosinophils Relative: 6.8 %
HCT: 35.8 % (ref 35.0–45.0)
Hemoglobin: 11.7 g/dL (ref 11.7–15.5)
Lymphs Abs: 1197.8 cells/uL (ref 850–3900)
MCH: 28.7 pg (ref 27.0–33.0)
MCHC: 32.7 g/dL (ref 32.0–36.0)
MCV: 88 fL (ref 80.0–100.0)
MPV: 10.7 fL (ref 7.5–12.5)
Monocytes Relative: 9.2 %
Neutro Abs: 3175 cells/uL (ref 1500–7800)
Neutrophils Relative %: 59.9 %
Platelets: 314 10*3/uL (ref 140–400)
RBC: 4.07 10*6/uL (ref 3.80–5.10)
RDW: 11.8 % (ref 11.0–15.0)
Total Lymphocyte: 22.6 %
WBC: 5.3 10*3/uL (ref 3.8–10.8)

## 2022-05-09 LAB — PROTEIN / CREATININE RATIO, URINE
Creatinine, Urine: 91 mg/dL (ref 20–275)
Protein/Creat Ratio: 77 mg/g creat (ref 24–184)
Protein/Creatinine Ratio: 0.077 mg/mg creat (ref 0.024–0.184)
Total Protein, Urine: 7 mg/dL (ref 5–24)

## 2022-05-09 LAB — ANTI-DNA ANTIBODY, DOUBLE-STRANDED: ds DNA Ab: 1 IU/mL

## 2022-05-09 LAB — C3 AND C4
C3 Complement: 132 mg/dL (ref 83–193)
C4 Complement: 32 mg/dL (ref 15–57)

## 2022-05-09 LAB — RNP ANTIBODY: Ribonucleic Protein(ENA) Antibody, IgG: >8 AI — AB

## 2022-05-22 ENCOUNTER — Other Ambulatory Visit: Payer: Self-pay | Admitting: Physician Assistant

## 2022-05-29 ENCOUNTER — Other Ambulatory Visit: Payer: Self-pay | Admitting: Adult Health

## 2022-05-29 DIAGNOSIS — K219 Gastro-esophageal reflux disease without esophagitis: Secondary | ICD-10-CM

## 2022-05-29 DIAGNOSIS — I10 Essential (primary) hypertension: Secondary | ICD-10-CM

## 2022-05-29 DIAGNOSIS — Z0000 Encounter for general adult medical examination without abnormal findings: Secondary | ICD-10-CM

## 2022-05-31 NOTE — Pre-Procedure Instructions (Signed)
Surgical Instructions    Your procedure is scheduled on Jun 11, 2022.  Report to [facility] Main Entrance "A" at 5:30 A.M., then check in with the Admitting office.  Call this number if you have problems the morning of surgery:  [phone]  If you have any questions prior to your surgery date call [phone]: Open Monday-Friday 8am-4pm If you experience any cold or flu symptoms such as cough, fever, chills, shortness of breath, etc. between now and your scheduled surgery, please notify us at the above number.     Remember:  Do not eat after midnight the night before your surgery  You may drink clear liquids until 4:30 AM the morning of your surgery.   Clear liquids allowed are: Water, Non-Citrus Juices (without pulp), Carbonated Beverages, Clear Tea, Black Coffee Only (NO MILK, CREAM OR POWDERED CREAMER of any kind), and Gatorade.  Patient Instructions  The night before surgery:  No food after midnight. ONLY clear liquids after midnight  The day of surgery (if you do NOT have diabetes):  Drink ONE (1) Pre-Surgery Clear Ensure by 4:30 AM the morning of surgery. Drink in one sitting. Do not sip.  This drink was given to you during your hospital  pre-op appointment visit.  Nothing else to drink after completing the  Pre-Surgery Clear Ensure.         If you have questions, please contact your surgeon's office.     Take these medicines the morning of surgery with A SIP OF WATER:  azithromycin (ZITHROMAX)   brimonidine (ALPHAGAN) 0.2 % ophthalmic solution   fluticasone (FLONASE) nasal spray   hydroxychloroquine (PLAQUENIL)   pantoprazole (PROTONIX)   prednisoLONE acetate (PRED FORTE) ophthalmic suspension   sertraline (ZOLOFT)   timolol (TIMOPTIC) ophthalmic solution    May take these medicines IF NEEDED:  ALPRAZolam Prudy Feeler)     Follow your surgeon's instructions on when to stop Aspirin.  If no instructions were given by your surgeon then you will need to call the office  to get those instructions.    As of today, STOP taking any Aleve, Naproxen, Ibuprofen, Motrin, Advil, Goody's, BC's, all herbal medications, fish oil, and all vitamins.                     Do NOT Smoke (Tobacco/Vaping) for 24 hours prior to your procedure.  If you use a CPAP at night, you may bring your mask/headgear for your overnight stay.   Contacts, glasses, piercing's, hearing aid's, dentures or partials may not be worn into surgery, please bring cases for these belongings.    For patients admitted to the hospital, discharge time will be determined by your treatment team.   Patients discharged the day of surgery will not be allowed to drive home, and someone needs to stay with them for 24 hours.  SURGICAL WAITING ROOM VISITATION Patients having surgery or a procedure may have no more than 2 support people in the waiting area - these visitors may rotate.   Children under the age of 21 must have an adult with them who is not the patient. If the patient needs to stay at the hospital during part of their recovery, the visitor guidelines for inpatient rooms apply. Pre-op nurse will coordinate an appropriate time for 1 support person to accompany patient in pre-op.  This support person may not rotate.   Please refer to the [facility] website for the visitor guidelines for Inpatients (after your surgery is over and you are in  a regular room).    Special instructions:   [place]- Preparing For Surgery  Before surgery, you can play an important role. Because skin is not sterile, your skin needs to be as free of germs as possible. You can reduce the number of germs on your skin by washing with CHG (chlorahexidine gluconate) Soap before surgery.  CHG is an antiseptic cleaner which kills germs and bonds with the skin to continue killing germs even after washing.    Please follow the instructions on the handout you received at your pre-admission appointment about preparing for your upcoming  surgery using the CHG surgical soap. If you have any questions or concerns, please call one of the numbers listed on the first page of this paperwork.   Day of Surgery: Take a shower with CHG soap. Do not wear jewelry or makeup Do not wear lotions, powders, perfumes/colognes, or deodorant. Do not shave 48 hours prior to surgery.  Men may shave face and neck. Do not bring valuables to the hospital.  [facility] is not responsible for any belongings or valuables. Do not wear nail polish, gel polish, artificial nails, or any other type of covering on natural nails (fingers and toes) If you have artificial nails or gel coating that need to be removed by a nail salon, please have this removed prior to surgery. Artificial nails or gel coating may interfere with anesthesia's ability to adequately monitor your vital signs. Wear Clean/Comfortable clothing the morning of surgery Remember to brush your teeth WITH YOUR REGULAR TOOTHPASTE.   Please read over the following fact sheets that you were given.    If you received a COVID test during your pre-op visit  it is requested that you wear a mask when out in public, stay away from anyone that may not be feeling well and notify your surgeon if you develop symptoms. If you have been in contact with anyone that has tested positive in the last 10 days please notify you surgeon.

## 2022-06-01 ENCOUNTER — Other Ambulatory Visit: Payer: Self-pay

## 2022-06-01 ENCOUNTER — Encounter (HOSPITAL_COMMUNITY): Payer: Self-pay

## 2022-06-01 ENCOUNTER — Encounter (HOSPITAL_COMMUNITY)
Admission: RE | Admit: 2022-06-01 | Discharge: 2022-06-01 | Disposition: A | Discharge status: Home / Self Care | Payer: Medicare HMO | Source: Ambulatory Visit | Attending: Orthopaedic Surgery | Admitting: Orthopaedic Surgery

## 2022-06-01 VITALS — BP 133/72 | HR 74 | Temp 97.9°F | Resp 18 | Ht 69.0 in | Wt 260.6 lb

## 2022-06-01 DIAGNOSIS — I872 Venous insufficiency (chronic) (peripheral): Secondary | ICD-10-CM | POA: Insufficient documentation

## 2022-06-01 DIAGNOSIS — K219 Gastro-esophageal reflux disease without esophagitis: Secondary | ICD-10-CM | POA: Diagnosis not present

## 2022-06-01 DIAGNOSIS — Z87891 Personal history of nicotine dependence: Secondary | ICD-10-CM | POA: Insufficient documentation

## 2022-06-01 DIAGNOSIS — M4802 Spinal stenosis, cervical region: Secondary | ICD-10-CM | POA: Insufficient documentation

## 2022-06-01 DIAGNOSIS — Z01818 Encounter for other preprocedural examination: Secondary | ICD-10-CM | POA: Diagnosis present

## 2022-06-01 DIAGNOSIS — I10 Essential (primary) hypertension: Secondary | ICD-10-CM | POA: Diagnosis not present

## 2022-06-01 DIAGNOSIS — M069 Rheumatoid arthritis, unspecified: Secondary | ICD-10-CM | POA: Insufficient documentation

## 2022-06-01 LAB — BASIC METABOLIC PANEL
Anion gap: 9 (ref 5–15)
BUN: 10 mg/dL (ref 8–23)
CO2: 25 mmol/L (ref 22–32)
Calcium: 9.2 mg/dL (ref 8.9–10.3)
Chloride: 106 mmol/L (ref 98–111)
Creatinine, Ser: 0.81 mg/dL (ref 0.44–1.00)
GFR, Estimated: >60 mL/min (ref >60)
Glucose, Bld: 85 mg/dL (ref 70–99)
Potassium: 4 mmol/L (ref 3.5–5.1)
Sodium: 140 mmol/L (ref 135–145)

## 2022-06-01 LAB — CBC
HCT: 36.8 % (ref 36.0–46.0)
Hemoglobin: 11.7 g/dL — ABNORMAL LOW (ref 12.0–15.0)
MCH: 29.2 pg (ref 26.0–34.0)
MCHC: 31.8 g/dL (ref 30.0–36.0)
MCV: 91.8 fL (ref 80.0–100.0)
Platelets: 243 10*3/uL (ref 150–400)
RBC: 4.01 MIL/uL (ref 3.87–5.11)
RDW: 12.7 % (ref 11.5–15.5)
WBC: 4.7 10*3/uL (ref 4.0–10.5)
nRBC: 0 % (ref 0.0–0.2)

## 2022-06-01 LAB — SURGICAL PCR SCREEN
MRSA, PCR: NEGATIVE
Staphylococcus aureus: NEGATIVE

## 2022-06-01 NOTE — Progress Notes (Addendum)
PCP - Shirline Frees, NP Cardiologist - Denies  PPM/ICD - Denies  Chest x-ray - n/a  EKG - DOS, 06/01/2022 Stress Test - 04/06/2000 ECHO - 10/27/2014 Cardiac Cath -   Sleep Study - diagnosed with sleep apnea CPAP - Does not wear her CPAP  Non-Diabetic Last dose of GLP1 agonist-  na GLP1 instructions: n/a  Blood Thinner Instructions: Denies Aspirin Instructions:Denies  ERAS Protcol -Yes PRE-SURGERY Ensure  COVID TEST- n/a  Anesthesia review: Yes. HTN, difficult intubation, obesity.   Patient denies shortness of breath, fever, cough and chest pain at PAT appointment   All instructions explained to the patient, with a verbal understanding of the material. Patient agrees to go over the instructions while at home for a better understanding. Patient also instructed to self quarantine after being tested for COVID-19. The opportunity to ask questions was provided.

## 2022-06-04 ENCOUNTER — Encounter: Payer: Self-pay | Admitting: Physician Assistant

## 2022-06-04 ENCOUNTER — Ambulatory Visit (INDEPENDENT_AMBULATORY_CARE_PROVIDER_SITE_OTHER): Payer: Medicare HMO | Admitting: Physician Assistant

## 2022-06-04 VITALS — BP 174/76 | HR 70

## 2022-06-04 DIAGNOSIS — M4802 Spinal stenosis, cervical region: Secondary | ICD-10-CM

## 2022-06-04 NOTE — H&P (Signed)
Taylor Mccall is an 77 y.o. female.   Chief Complaint: Cervical spinal stenosis HPI:  77 year old female with autoimmune disease followed by Dr. Corliss Skains on Plaquenil seen with chronic neck pain, cervical spinal stenosis and gait disturbance.  Complex history with a slipped capital femoral process as a young girl on the right hip.  Left hip total of arthroplasty Dr. Ollen Bowl later revision by Dr. Magnus Ivan.  She has had right total knee arthroplasty Dr. Lajoyce Corners 2015 left total knee revision by Dr. Magnus Ivan 2022.  She has had neck pain numbness and tingling in her hands pain on the right side of her neck that radiates into her shoulders.  Balance problems gait disturbance has been using a cane for ambulation.  Previous MRI scan showed moderate to severe stenosis at C3-4 and C4-5 without cord changes.  Due to increased gait problems and balance problems new MRI was obtained 04/25/2022 which shows some progression of stenosis with narrowing at C3-4 down to 5 mm AP diameter with severe left foraminal stenosis and narrowing at C5-6 down to 4 to 5 mm AP diameter which had progressed without cord changes.  She appeared to autofused at C4-5 with bone bridging across the disc space and has some mild stenosis at C3 4-5.  Neck pain hand numbness and gait disturbance have been her principal problems.  Past Medical History:  Diagnosis Date   Anxiety    Blood transfusion without reported diagnosis    Breast mass, left    Cataract    Depression    Difficult intubation 04/01/2020   Known difficult airway from previous records   Gait disturbance    GERD (gastroesophageal reflux disease)    Hypertension    Lymphedema of both lower extremities    Seeing OT at [facility] - dreeeing to Left Lower leg   Obesity    Rheumatoid arthritis(714.0)    Sleep apnea    cpap   Urinary incontinence    Venous insufficiency     Past Surgical History:  Procedure Laterality Date   ABDOMINAL HYSTERECTOMY      with vesicovaginal fistula closure   ANTERIOR HIP REVISION Left 04/01/2020   Procedure: ANTERIOR LEFT HIP REVISION OF ACETABULAR COMPONENT;  Surgeon: Kathryne Hitch, MD;  Location: [facility];  Service: Orthopedics;  Laterality: Left;  3E   BLADDER SUSPENSION  2009   sling   BREAST REDUCTION SURGERY     COLONOSCOPY     ESOPHAGOGASTRODUODENOSCOPY     EYE SURGERY     REFRACTIVE SURGERY Right 06/26/2021   REPLACEMENT TOTAL KNEE Left    TOE SURGERY Left    Left great toe   toenail removal Right    all 5 toenails   TOTAL HIP ARTHROPLASTY     TOTAL KNEE ARTHROPLASTY Right 12/09/2013   Procedure: Right Total Knee Arthroplasty;  Surgeon: Nadara Mustard, MD;  Location: [facility] OR;  Service: Orthopedics;  Laterality: Right;   TOTAL KNEE REVISION Left 11/25/2020   Procedure: LEFT TOTAL KNEE REVISION;  Surgeon: Kathryne Hitch, MD;  Location: [facility];  Service: Orthopedics;  Laterality: Left;    Family History  Problem Relation Age of Onset   Coronary artery disease Father    Skin cancer Father    Heart disease Father    Asthma Brother    Heart disease Brother    Coronary artery disease Brother    Diabetes Other    Stroke Other    Colon cancer Neg Hx    Esophageal  cancer Neg Hx    Rectal cancer Neg Hx    Stomach cancer Neg Hx    Social History:  reports that she quit smoking about 44 years ago. Her smoking use included cigarettes. She has a 3.00 pack-year smoking history. She has never been exposed to tobacco smoke. She has never used smokeless tobacco. She reports that she does not drink alcohol and does not use drugs.  Allergies:  Allergies  Allergen Reactions   Penicillins Rash    (Not in a hospital admission)   No results found for this or any previous visit (from the past 48 hour(s)). No results found.  Review of Systems  All other systems reviewed and are negative.   Blood pressure (!) 174/76, pulse 70. Physical Exam   Objective: Vital Signs: BP 135/82   Ht  5\' 10"  (1.778 m)   Wt 256 lb (116.1 kg)   BMI 36.73 kg/m    Physical Exam Constitutional:      Appearance: She is well-developed.  HENT:     Head: Normocephalic.     Right Ear: External ear normal.     Left Ear: External ear normal. There is no impacted cerumen.  Eyes:     Pupils: Pupils are equal, round, and reactive to light.  Neck:     Thyroid: No thyromegaly.     Trachea: No tracheal deviation.  Cardiovascular:     Rate and Rhythm: Normal rate.  Pulmonary:     Effort: Pulmonary effort is normal.  Abdominal:     Palpations: Abdomen is soft.  Musculoskeletal:     Cervical back: No rigidity.  Skin:    General: Skin is warm and dry.  Neurological:     Mental Status: She is alert and oriented to person, place, and time.  Psychiatric:        Behavior: Behavior normal.   Ortho Exam patient with bilateral brachial plexus tenderness.  Negative Lhermitte.  Upper extremity reflexes are 2+ and symmetrical lower extremity 3+ but no clonus.  She has wide-based gait balance problems ambulating with a cane.  Assessment/Plan Patient with progressive severe stenosis C3-4 C5-6 without cord changes.  Persistent problems with neck pain upper extremity numbness gait disturbance.  We discussed surgery for her stenosis.  Plan would be a two-level cervical fusion at C3-4 and C5-6 leaving the level in between at C4-5 alone since it appears to be autofused in.  She has significant bone disc osteophytes causing compression on the cord at C3-4 and also C5-6.  Plan overnight stay soft collar x 6 weeks.  Risk surgery discussed.  Daughter was present for the discussion she understands and agrees to proceed.  06/04/2022, 3:50 PM

## 2022-06-04 NOTE — Progress Notes (Signed)
Anesthesia Chart Review:  Case: [id number] Date/Time: 06/11/22 0715   Procedure: ANTERIOR CERVICAL DISCECTOMY FUSION C3-4, C5-6 WITH ALLOGRAFT, PLATE   Anesthesia type: General   Pre-op diagnosis: cervical stenosis C3-4, C5-6   [facility] ROOM 05 / [facility]   Surgeons: Eldred Manges, MD       DISCUSSION: Patient is a 77 year old female scheduled for the above procedure.  History includes former smoker (quit 02/05/78), HTN, OSA (does not wear CPAP), RA, venous insufficiency/BLE lymphedema, GERD, osteoarthritis (right TKA 12/09/13 with revision 11/25/20; left THA 04/01/20), DIFFICULT INTUBATION.  [facility] Intubation Record Summary: - 12/09/13: "DLx1 with MAC3 - grade 4 view, DLx1 with MAC 3 and bougie - ETT passed easily over bougie. " Mask ventilation without difficulty.   - 04/01/20: IV induction, Glidescope and 3, Stylet and Video-laryngoscopy, Grade 1 view, 7.5 mm ETT, 2 attempts, Difficulty was anticipated due to dentition and Difficult Airway- due to anterior larynx; Recommend- induction with short-acting agent, and alternative techniques readily available  She denied SOB and chest pain. PAT EKG and labs results appear acceptable for OR. Anesthesia team to evaluate on the day of surgery.    VS: BP 133/72   Pulse 74   Temp 36.6 C (Oral)   Resp 18   Ht 5\' 9"  (1.753 m)   Wt 118.2 kg   SpO2 99%   BMI 38.48 kg/m    PROVIDERS: Shirline Frees, NP is PCP  Pollyann Savoy, MD is rheumatologist She is not followed routinely by cardiology, but saw Donato Schultz, MD on 10/15/14 for LE edema felt secondary to venous insufficiency. Echo ordered, otherwise as needed follow-up. She later saw vascular surgeon Gretta Began, MD in 2019 who felt edema was due to lymphedema and advised compression and consideration to lymphedema therapist for therapeutic massage.     LABS: Labs reviewed: Acceptable for surgery. LFTs normal 05/08/22. (all labs ordered are listed, but only abnormal results are  displayed)  Labs Reviewed  CBC - Abnormal; Notable for the following components:      Result Value   Hemoglobin 11.7 (*)    All other components within normal limits  SURGICAL PCR SCREEN  BASIC METABOLIC PANEL     IMAGES: MRI C-spine 04/25/22: IMPRESSION: 1. Multilevel cervical spondylosis with resultant diffuse spinal stenosis at C2-3 through C7-T1, severe at the C3-4 and C5-6 levels. Overall, appearance is mildly progressed as compared to previous MRI from 11/12/2019. 2. Multifactorial degenerative changes with resultant multilevel foraminal narrowing as above. Notable findings include severe bilateral C4 foraminal stenosis, with moderate left and mild right C8 foraminal narrowing. 3. Reactive marrow edema about the right C1-2 articulation due to osteoarthritis. Finding could serve as a source for neck pain.   EKG: 06/01/22: NSR   CV: Echo 10/27/14: Study Conclusions   - Left ventricle: The cavity size was normal. Wall thickness was    normal. Systolic function was normal. The estimated ejection    fraction was in the range of 55% to 60%. Wall motion was normal;    there were no regional wall motion abnormalities. Doppler    parameters are consistent with abnormal left ventricular    relaxation (grade 1 diastolic dysfunction). The E./e&' ratio is    between 8-15, suggesting indeterminate LV filling pressure.  - Left atrium: The atrium was normal in size.  - Tricuspid valve: There was trivial regurgitation.  - Pulmonary arteries: PA peak pressure: 25 mm Hg (S).  - Inferior vena cava: The vessel was  normal in size. The    respirophasic diameter changes were in the normal range (>= 50%),    consistent with normal central venous pressure.   Impressions:  - LVEF 55-60%, normal wall thickness, normal wall motion, diastolic    dysfunction, indeterminate LV filling pressure. Normal LA size,    RVSP 25 mmHg.    Past Medical History:  Diagnosis Date   Anxiety    Blood  transfusion without reported diagnosis    Breast mass, left    Cataract    Depression    Difficult intubation 04/01/2020   Known difficult airway from previous records   Gait disturbance    GERD (gastroesophageal reflux disease)    Hypertension    Lymphedema of both lower extremities    Seeing OT at [facility] - dreeeing to Left Lower leg   Obesity    Rheumatoid arthritis(714.0)    Sleep apnea    cpap   Urinary incontinence    Venous insufficiency     Past Surgical History:  Procedure Laterality Date   ABDOMINAL HYSTERECTOMY     with vesicovaginal fistula closure   ANTERIOR HIP REVISION Left 04/01/2020   Procedure: ANTERIOR LEFT HIP REVISION OF ACETABULAR COMPONENT;  Surgeon: Kathryne Hitch, MD;  Location: [facility];  Service: Orthopedics;  Laterality: Left;  3E   BLADDER SUSPENSION  2009   sling   BREAST REDUCTION SURGERY     COLONOSCOPY     ESOPHAGOGASTRODUODENOSCOPY     EYE SURGERY     REFRACTIVE SURGERY Right 06/26/2021   REPLACEMENT TOTAL KNEE Left    TOE SURGERY Left    Left great toe   toenail removal Right    all 5 toenails   TOTAL HIP ARTHROPLASTY     TOTAL KNEE ARTHROPLASTY Right 12/09/2013   Procedure: Right Total Knee Arthroplasty;  Surgeon: Nadara Mustard, MD;  Location: [facility] OR;  Service: Orthopedics;  Laterality: Right;   TOTAL KNEE REVISION Left 11/25/2020   Procedure: LEFT TOTAL KNEE REVISION;  Surgeon: Kathryne Hitch, MD;  Location: [facility];  Service: Orthopedics;  Laterality: Left;    MEDICATIONS:  ALPRAZolam (XANAX) 0.25 MG tablet   aspirin EC 81 MG tablet   azithromycin (ZITHROMAX) 500 MG tablet   brimonidine (ALPHAGAN) 0.2 % ophthalmic solution   Cholecalciferol (VITAMIN D3) 125 MCG (5000 UT) CHEW   Cyanocobalamin (VITAMIN B-12 PO)   fluticasone (FLONASE) 50 MCG/ACT nasal spray   furosemide (LASIX) 20 MG tablet   hydroxychloroquine (PLAQUENIL) 200 MG tablet   KLOR-CON M20 20 MEQ tablet   latanoprost (XALATAN)  0.005 % ophthalmic solution   losartan (COZAAR) 25 MG tablet   Multiple Vitamins-Minerals (MULTIVITAMIN GUMMIES WOMENS) CHEW   Multiple Vitamins-Minerals (PRESERVISION AREDS 2) CAPS   naproxen sodium (ALEVE) 220 MG tablet   Omega-3 Fatty Acids (FISH OIL) 1200 MG CAPS   pantoprazole (PROTONIX) 40 MG tablet   Polyethyl Glycol-Propyl Glycol (SYSTANE OP)   rosuvastatin (CRESTOR) 5 MG tablet   sertraline (ZOLOFT) 100 MG tablet   timolol (TIMOPTIC) 0.5 % ophthalmic solution   vitamin E 180 MG (400 UNITS) capsule   No current facility-administered medications for this encounter.  Per PAT RN note, she denied current ASA use. Azithromycin is PRN dental procedure.    Shonna Chock, PA-C Surgical Short Stay/Anesthesiology [facility] Phone [phone] [facility] Phone [phone] 06/04/2022 1:31 PM

## 2022-06-04 NOTE — Anesthesia Preprocedure Evaluation (Addendum)
Anesthesia Evaluation  Patient identified by MRN, date of birth, ID band Patient awake    Reviewed: Allergy & Precautions, H&P , NPO status , Patient's Chart, lab work & pertinent test results  Airway Mallampati: II  TM Distance: >3 FB Neck ROM: Full    Dental no notable dental hx.    Pulmonary sleep apnea , former smoker   Pulmonary exam normal breath sounds clear to auscultation       Cardiovascular hypertension, Pt. on medications Normal cardiovascular exam Rhythm:Regular Rate:Normal     Neuro/Psych   Anxiety Depression    negative neurological ROS  negative psych ROS   GI/Hepatic Neg liver ROS,GERD  ,,  Endo/Other  negative endocrine ROS    Renal/GU negative Renal ROS  negative genitourinary   Musculoskeletal  (+) Arthritis , Osteoarthritis,    Abdominal  (+) + obese  Peds negative pediatric ROS (+)  Hematology negative hematology ROS (+)   Anesthesia Other Findings   Reproductive/Obstetrics negative OB ROS                             Anesthesia Physical Anesthesia Plan  ASA: 3  Anesthesia Plan: General   Post-op Pain Management: Dilaudid IV   Induction: Intravenous  PONV Risk Score and Plan: 3 and Ondansetron, Dexamethasone, Midazolam and Treatment may vary due to age or medical condition  Airway Management Planned: Oral ETT  Additional Equipment:   Intra-op Plan:   Post-operative Plan: Extubation in OR  Informed Consent: I have reviewed the patients History and Physical, chart, labs and discussed the procedure including the risks, benefits and alternatives for the proposed anesthesia with the patient or authorized representative who has indicated his/her understanding and acceptance.     Dental advisory given  Plan Discussed with: CRNA  Anesthesia Plan Comments: (PAT note written 06/04/2022 by Shonna Chock, PA-C.  Difficult intubation history:  - 12/09/13:  "DLx1 with MAC3 - grade 4 view, DLx1 with MAC 3 and bougie - ETT passed easily over bougie. " Mask ventilation without difficulty.  - 04/01/20: IV induction, Glidescope and 3, Stylet and Video-laryngoscopy, Grade 1 view, 7.5 mm ETT, 2 attempts, Difficulty was anticipated due to dentition and Difficult Airway- due to anterior larynx; Recommend- induction with short-acting agent, and alternative techniques readily available )       Anesthesia Quick Evaluation

## 2022-06-04 NOTE — Progress Notes (Signed)
Office Visit Note   Patient: Taylor Mccall           Date of Birth: [birth year]           MRN: [id number] Visit Date: 06/04/2022              Requested by: Shirline Frees, NP [address] [place],  [place] [address] PCP: Shirline Frees, NP  Chief Complaint  Patient presents with   Neck - Follow-up    H&P      HPI: Meshell comes in today for her preoperative history and physical for her upcoming cervical fusion of C3-4 and C5-6.  She has autofused the level between.  She has failed conservative treatment of this problem.  She has had several surgeries in the past denies any history of blood clots  Assessment & Plan: Plan: Cervical radiculopathy and arthritis.  Plan for fusion of C3-4 C5-6 risk of procedure were discussed she understands she needs to wear a soft collar for 6 weeks.  She did go to preop but was not told if she needed to stop her aspirin.  She takes this only for preventative purposes she has never had a history of blood clot.  So I have asked her to discontinue it till after surgery.  Full history and physical is dictated in the hospital system  Follow-Up Instructions: Return 1 week postop with Dr. Ophelia Charter.   Ortho Exam  Patient is alert, oriented, no adenopathy, well-dressed, normal affect, normal respiratory effort Patient is alert pleasant to exam she does have stiffness with turning her head side-to-side and up and down which creates discomfort.  She has limited range of motion Imaging: No results found. No images are attached to the encounter.  Labs: Lab Results  Component Value Date   HGBA1C 5.2 09/13/2020   HGBA1C 5.0 08/15/2016   ESRSEDRATE 17 05/08/2022   ESRSEDRATE 6 12/04/2021   ESRSEDRATE 9 07/05/2021   LABURIC 4.2 01/19/2008   REPTSTATUS 07/26/2019 FINAL 07/25/2019   CULT (A) 07/25/2019    <10,000 COLONIES/mL INSIGNIFICANT GROWTH Performed at [facility], [address], [place], [place] [address]    [facility] ESCHERICHIA COLI  07/28/2014     Lab Results  Component Value Date   ALBUMIN 4.2 09/14/2021   ALBUMIN 4.1 09/13/2020   ALBUMIN 3.7 07/25/2019    No results found for: "MG" Lab Results  Component Value Date   VD25OH 32 02/03/2021   VD25OH 44 06/15/2019   VD25OH 25 (L) 03/18/2019    No results found for: "PREALBUMIN"    Latest Ref Rng & Units 06/01/2022    3:05 PM 05/08/2022    1:48 PM 12/04/2021    9:41 AM  CBC EXTENDED  WBC 4.0 - 10.5 K/uL 4.7  5.3  3.9   RBC 3.87 - 5.11 MIL/uL 4.01  4.07  4.04   Hemoglobin 12.0 - 15.0 g/dL 86.5  78.4  69.6   HCT 36.0 - 46.0 % 36.8  35.8  35.7   Platelets 150 - 400 K/uL 243  314  226   NEUT# 1,500 - 7,800 cells/uL  3,175  2,126   Lymph# 850 - 3,900 cells/uL  1,197.8  1,022      There is no height or weight on file to calculate BMI.  Orders:  No orders of the defined types were placed in this encounter.  No orders of the defined types were placed in this encounter.    Procedures: No procedures performed  Clinical Data:  No additional findings.  ROS:  All other systems negative, except as noted in the HPI. Review of Systems  Objective: Vital Signs: BP (!) 174/76   Pulse 70   Specialty Comments:  No specialty comments available.  PMFS History: Patient Active Problem List   Diagnosis Date Noted   Spinal stenosis of cervical region 05/02/2022   Status post revision of total knee replacement, left 11/25/2020   Failed total knee, left, subsequent encounter 11/24/2020   Status post revision of total hip 04/01/2020   Polyethylene liner wear following total hip arthroplasty requiring isolated polyethylene liner exchange (HCC) 03/31/2020   Intermediate stage nonexudative age-related macular degeneration of right eye 12/23/2019   Exudative age-related macular degeneration of left eye with inactive choroidal neovascularization (HCC) 12/23/2019   Primary open angle glaucoma of both eyes, moderate stage 12/23/2019   Exudative retinopathy of left  eye 12/23/2019   Routine general medical examination at a health care facility 12/11/2016   Primary osteoarthritis of left hip 05/01/2016   Autoimmune disease (HCC) 11/29/2015   High risk medication use 11/29/2015   Vitamin D deficiency 11/29/2015   Macular degeneration 11/29/2015   Bilateral lower extremity edema 10/26/2015   H/O total knee replacement, bilateral 12/09/2013   Breast mass, left 01/18/2011   Obesity 12/08/2009   Venous (peripheral) insufficiency 07/28/2009   PAIN IN JOINT, ANKLE AND FOOT 01/12/2008   Obstructive sleep apnea 08/29/2007   GAIT DISTURBANCE 08/14/2007   Depression 08/26/2006   Essential hypertension 08/26/2006   Primary osteoarthritis of both hands 08/26/2006   Urinary incontinence 08/26/2006   Past Medical History:  Diagnosis Date   Anxiety    Blood transfusion without reported diagnosis    Breast mass, left    Cataract    Depression    Difficult intubation 04/01/2020   Known difficult airway from previous records   Gait disturbance    GERD (gastroesophageal reflux disease)    Hypertension    Lymphedema of both lower extremities    Seeing OT at [facility] - dreeeing to Left Lower leg   Obesity    Rheumatoid arthritis(714.0)    Sleep apnea    cpap   Urinary incontinence    Venous insufficiency     Family History  Problem Relation Age of Onset   Coronary artery disease Father    Skin cancer Father    Heart disease Father    Asthma Brother    Heart disease Brother    Coronary artery disease Brother    Diabetes Other    Stroke Other    Colon cancer Neg Hx    Esophageal cancer Neg Hx    Rectal cancer Neg Hx    Stomach cancer Neg Hx     Past Surgical History:  Procedure Laterality Date   ABDOMINAL HYSTERECTOMY     with vesicovaginal fistula closure   ANTERIOR HIP REVISION Left 04/01/2020   Procedure: ANTERIOR LEFT HIP REVISION OF ACETABULAR COMPONENT;  Surgeon: Kathryne Hitch, MD;  Location: [facility];   Service: Orthopedics;  Laterality: Left;  3E   BLADDER SUSPENSION  2009   sling   BREAST REDUCTION SURGERY     COLONOSCOPY     ESOPHAGOGASTRODUODENOSCOPY     EYE SURGERY     REFRACTIVE SURGERY Right 06/26/2021   REPLACEMENT TOTAL KNEE Left    TOE SURGERY Left    Left great toe   toenail removal Right    all 5 toenails   TOTAL HIP ARTHROPLASTY  TOTAL KNEE ARTHROPLASTY Right 12/09/2013   Procedure: Right Total Knee Arthroplasty;  Surgeon: Nadara Mustard, MD;  Location: [facility] OR;  Service: Orthopedics;  Laterality: Right;   TOTAL KNEE REVISION Left 11/25/2020   Procedure: LEFT TOTAL KNEE REVISION;  Surgeon: Kathryne Hitch, MD;  Location: [facility];  Service: Orthopedics;  Laterality: Left;   Social History   Occupational History   Not on file  Tobacco Use   Smoking status: Former    Packs/day: 0.30    Years: 10.00    Additional pack years: 0.00    Total pack years: 3.00    Types: Cigarettes    Quit date: 02/05/1978    Years since quitting: 44.3    Passive exposure: Never   Smokeless tobacco: Never   Tobacco comments:    over 25 years ago...pt doesnt remember when she quit.   Vaping Use   Vaping Use: Never used  Substance and Sexual Activity   Alcohol use: No   Drug use: No   Sexual activity: Not on file

## 2022-06-04 NOTE — H&P (View-Only) (Signed)
Taylor Mccall is an 76 y.o. female.   Chief Complaint: Cervical spinal stenosis HPI:  76-year-old female with autoimmune disease followed by Dr. Deveshwar on Plaquenil seen with chronic neck pain, cervical spinal stenosis and gait disturbance.  Complex history with a slipped capital femoral process as a young girl on the right hip.  Left hip total of arthroplasty Dr. Harkins later revision by Dr. Blackman.  She has had right total knee arthroplasty Dr. Duda 2015 left total knee revision by Dr. Blackman 2022.  She has had neck pain numbness and tingling in her hands pain on the right side of her neck that radiates into her shoulders.  Balance problems gait disturbance has been using a cane for ambulation.  Previous MRI scan showed moderate to severe stenosis at C3-4 and C4-5 without cord changes.  Due to increased gait problems and balance problems new MRI was obtained 04/25/2022 which shows some progression of stenosis with narrowing at C3-4 down to 5 mm AP diameter with severe left foraminal stenosis and narrowing at C5-6 down to 4 to 5 mm AP diameter which had progressed without cord changes.  She appeared to autofused at C4-5 with bone bridging across the disc space and has some mild stenosis at C3 4-5.  Neck pain hand numbness and gait disturbance have been her principal problems.  Past Medical History:  Diagnosis Date   Anxiety    Blood transfusion without reported diagnosis    Breast mass, left    Cataract    Depression    Difficult intubation 04/01/2020   Known difficult airway from previous records   Gait disturbance    GERD (gastroesophageal reflux disease)    Hypertension    Lymphedema of both lower extremities    Seeing OT at [facility] - dreeeing to Left Lower leg   Obesity    Rheumatoid arthritis(714.0)    Sleep apnea    cpap   Urinary incontinence    Venous insufficiency     Past Surgical History:  Procedure Laterality Date   ABDOMINAL HYSTERECTOMY      with vesicovaginal fistula closure   ANTERIOR HIP REVISION Left 04/01/2020   Procedure: ANTERIOR LEFT HIP REVISION OF ACETABULAR COMPONENT;  Surgeon: Blackman, Christopher Y, MD;  [facility];  Service: Orthopedics;  Laterality: Left;  3E   BLADDER SUSPENSION  2009   sling   BREAST REDUCTION SURGERY     COLONOSCOPY     ESOPHAGOGASTRODUODENOSCOPY     EYE SURGERY     REFRACTIVE SURGERY Right 06/26/2021   REPLACEMENT TOTAL KNEE Left    TOE SURGERY Left    Left great toe   toenail removal Right    all 5 toenails   TOTAL HIP ARTHROPLASTY     TOTAL KNEE ARTHROPLASTY Right 12/09/2013   Procedure: Right Total Knee Arthroplasty;  Surgeon: Marcus Duda V, MD;  [facility];  Service: Orthopedics;  Laterality: Right;   TOTAL KNEE REVISION Left 11/25/2020   Procedure: LEFT TOTAL KNEE REVISION;  Surgeon: Blackman, Christopher Y, MD;  Location: [facility];  Service: Orthopedics;  Laterality: Left;    Family History  Problem Relation Age of Onset   Coronary artery disease Father    Skin cancer Father    Heart disease Father    Asthma Brother    Heart disease Brother    Coronary artery disease Brother    Diabetes Other    Stroke Other    Colon cancer Neg Hx    Esophageal   cancer Neg Hx    Rectal cancer Neg Hx    Stomach cancer Neg Hx    Social History:  reports that she quit smoking about 44 years ago. Her smoking use included cigarettes. She has a 3.00 pack-year smoking history. She has never been exposed to tobacco smoke. She has never used smokeless tobacco. She reports that she does not drink alcohol and does not use drugs.  Allergies:  Allergies  Allergen Reactions   Penicillins Rash    (Not in a hospital admission)   No results found for this or any previous visit (from the past 48 hour(s)). No results found.  Review of Systems  All other systems reviewed and are negative.   Blood pressure (!) 174/76, pulse 70. Physical Exam   Objective: Vital Signs: BP 135/82   Ht  5' 10" (1.778 m)   Wt 256 lb (116.1 kg)   BMI 36.73 kg/m    Physical Exam Constitutional:      Appearance: She is well-developed.  HENT:     Head: Normocephalic.     Right Ear: External ear normal.     Left Ear: External ear normal. There is no impacted cerumen.  Eyes:     Pupils: Pupils are equal, round, and reactive to light.  Neck:     Thyroid: No thyromegaly.     Trachea: No tracheal deviation.  Cardiovascular:     Rate and Rhythm: Normal rate.  Pulmonary:     Effort: Pulmonary effort is normal.  Abdominal:     Palpations: Abdomen is soft.  Musculoskeletal:     Cervical back: No rigidity.  Skin:    General: Skin is warm and dry.  Neurological:     Mental Status: She is alert and oriented to person, place, and time.  Psychiatric:        Behavior: Behavior normal.   Ortho Exam patient with bilateral brachial plexus tenderness.  Negative Lhermitte.  Upper extremity reflexes are 2+ and symmetrical lower extremity 3+ but no clonus.  She has wide-based gait balance problems ambulating with a cane.  Assessment/Plan Patient with progressive severe stenosis C3-4 C5-6 without cord changes.  Persistent problems with neck pain upper extremity numbness gait disturbance.  We discussed surgery for her stenosis.  Plan would be a two-level cervical fusion at C3-4 and C5-6 leaving the level in between at C4-5 alone since it appears to be autofused in.  She has significant bone disc osteophytes causing compression on the cord at C3-4 and also C5-6.  Plan overnight stay soft collar x 6 weeks.  Risk surgery discussed.  Daughter was present for the discussion she understands and agrees to proceed.  06/04/2022, 3:50 PM    

## 2022-06-11 ENCOUNTER — Ambulatory Visit (HOSPITAL_COMMUNITY): Payer: Medicare HMO | Admitting: Vascular Surgery

## 2022-06-11 ENCOUNTER — Ambulatory Visit (HOSPITAL_COMMUNITY): Payer: Medicare HMO

## 2022-06-11 ENCOUNTER — Encounter (HOSPITAL_COMMUNITY): Payer: Self-pay | Admitting: Orthopaedic Surgery

## 2022-06-11 ENCOUNTER — Ambulatory Visit (HOSPITAL_BASED_OUTPATIENT_CLINIC_OR_DEPARTMENT_OTHER): Payer: Medicare HMO | Admitting: Anesthesiology

## 2022-06-11 ENCOUNTER — Encounter (HOSPITAL_COMMUNITY)
Admission: RE | Disposition: A | Discharge status: Home / Self Care | Payer: Self-pay | Source: Home / Self Care | Attending: Orthopaedic Surgery

## 2022-06-11 ENCOUNTER — Other Ambulatory Visit: Payer: Self-pay

## 2022-06-11 ENCOUNTER — Observation Stay (HOSPITAL_COMMUNITY)
Admission: RE | Admit: 2022-06-11 | Discharge: 2022-06-12 | Disposition: A | Discharge status: Home / Self Care | Payer: Medicare HMO | Attending: Orthopaedic Surgery | Admitting: Orthopaedic Surgery

## 2022-06-11 DIAGNOSIS — M4302 Spondylolysis, cervical region: Secondary | ICD-10-CM | POA: Diagnosis not present

## 2022-06-11 DIAGNOSIS — Z981 Arthrodesis status: Secondary | ICD-10-CM

## 2022-06-11 DIAGNOSIS — Z96653 Presence of artificial knee joint, bilateral: Secondary | ICD-10-CM | POA: Diagnosis not present

## 2022-06-11 DIAGNOSIS — M4802 Spinal stenosis, cervical region: Secondary | ICD-10-CM | POA: Diagnosis present

## 2022-06-11 DIAGNOSIS — I10 Essential (primary) hypertension: Secondary | ICD-10-CM | POA: Insufficient documentation

## 2022-06-11 DIAGNOSIS — M47892 Other spondylosis, cervical region: Principal | ICD-10-CM | POA: Insufficient documentation

## 2022-06-11 DIAGNOSIS — Z87891 Personal history of nicotine dependence: Secondary | ICD-10-CM

## 2022-06-11 DIAGNOSIS — Z96642 Presence of left artificial hip joint: Secondary | ICD-10-CM | POA: Insufficient documentation

## 2022-06-11 HISTORY — PX: ANTERIOR CERVICAL DECOMP/DISCECTOMY FUSION: SHX1161

## 2022-06-11 SURGERY — ANTERIOR CERVICAL DECOMPRESSION/DISCECTOMY FUSION 2 LEVELS
Anesthesia: General

## 2022-06-11 MED ORDER — VITAMIN E 45 MG (100 UNIT) PO CAPS
400.0000 [IU] | ORAL_CAPSULE | Freq: Every day | ORAL | Status: DC
  Administered 2022-06-11 – 2022-06-12 (×2): 400 [IU] via ORAL
  Filled 2022-06-11 (×2): qty 4

## 2022-06-11 MED ORDER — ONDANSETRON HCL 4 MG/2ML IJ SOLN
INTRAMUSCULAR | Status: AC
  Filled 2022-06-11: qty 2

## 2022-06-11 MED ORDER — METHOCARBAMOL 500 MG PO TABS
500.0000 mg | ORAL_TABLET | Freq: Four times a day (QID) | ORAL | Status: DC | PRN
  Administered 2022-06-11 (×2): 500 mg via ORAL
  Filled 2022-06-11 (×2): qty 1

## 2022-06-11 MED ORDER — FENTANYL CITRATE (PF) 250 MCG/5ML IJ SOLN
INTRAMUSCULAR | Status: AC
  Filled 2022-06-11: qty 5

## 2022-06-11 MED ORDER — ROCURONIUM BROMIDE 10 MG/ML (PF) SYRINGE
PREFILLED_SYRINGE | INTRAVENOUS | Status: DC | PRN
  Administered 2022-06-11: 10 mg via INTRAVENOUS
  Administered 2022-06-11: 20 mg via INTRAVENOUS
  Administered 2022-06-11: 40 mg via INTRAVENOUS

## 2022-06-11 MED ORDER — ONDANSETRON HCL 4 MG/2ML IJ SOLN
INTRAMUSCULAR | Status: DC | PRN
  Administered 2022-06-11: 4 mg via INTRAVENOUS

## 2022-06-11 MED ORDER — OXYCODONE HCL 5 MG/5ML PO SOLN: 5.0000 mg | Freq: Once | ORAL | Status: DC | PRN

## 2022-06-11 MED ORDER — LIDOCAINE 2% (20 MG/ML) 5 ML SYRINGE
INTRAMUSCULAR | Status: AC
  Filled 2022-06-11: qty 5

## 2022-06-11 MED ORDER — LIDOCAINE 2% (20 MG/ML) 5 ML SYRINGE
INTRAMUSCULAR | Status: DC | PRN
  Administered 2022-06-11: 100 mg via INTRAVENOUS

## 2022-06-11 MED ORDER — PHENYLEPHRINE HCL-NACL 20-0.9 MG/250ML-% IV SOLN
INTRAVENOUS | Status: DC | PRN
  Administered 2022-06-11: 30 ug/min via INTRAVENOUS

## 2022-06-11 MED ORDER — DEXAMETHASONE SODIUM PHOSPHATE 10 MG/ML IJ SOLN
INTRAMUSCULAR | Status: AC
  Filled 2022-06-11: qty 2

## 2022-06-11 MED ORDER — PANTOPRAZOLE SODIUM 40 MG PO TBEC
40.0000 mg | DELAYED_RELEASE_TABLET | Freq: Every day | ORAL | Status: DC
  Administered 2022-06-12: 40 mg via ORAL
  Filled 2022-06-11: qty 1

## 2022-06-11 MED ORDER — MULTIVITAMIN GUMMIES WOMENS PO CHEW: 2.0000 | CHEWABLE_TABLET | Freq: Every day | ORAL | Status: DC

## 2022-06-11 MED ORDER — ROCURONIUM BROMIDE 10 MG/ML (PF) SYRINGE
PREFILLED_SYRINGE | INTRAVENOUS | Status: AC
  Filled 2022-06-11: qty 10

## 2022-06-11 MED ORDER — DEXAMETHASONE SODIUM PHOSPHATE 10 MG/ML IJ SOLN
INTRAMUSCULAR | Status: DC | PRN
  Administered 2022-06-11: 10 mg via INTRAVENOUS

## 2022-06-11 MED ORDER — SUCCINYLCHOLINE CHLORIDE 200 MG/10ML IV SOSY
PREFILLED_SYRINGE | INTRAVENOUS | Status: AC
  Filled 2022-06-11: qty 10

## 2022-06-11 MED ORDER — ACETAMINOPHEN 650 MG RE SUPP: 650.0000 mg | RECTAL | Status: DC | PRN

## 2022-06-11 MED ORDER — BRIMONIDINE TARTRATE 0.2 % OP SOLN
1.0000 [drp] | Freq: Two times a day (BID) | OPHTHALMIC | Status: DC
  Administered 2022-06-11 – 2022-06-12 (×2): 1 [drp] via OPHTHALMIC
  Filled 2022-06-11: qty 5

## 2022-06-11 MED ORDER — SUCCINYLCHOLINE CHLORIDE 200 MG/10ML IV SOSY
PREFILLED_SYRINGE | INTRAVENOUS | Status: DC | PRN
  Administered 2022-06-11: 120 mg via INTRAVENOUS

## 2022-06-11 MED ORDER — POTASSIUM CHLORIDE IN NACL 20-0.45 MEQ/L-% IV SOLN
INTRAVENOUS | Status: DC
  Filled 2022-06-11: qty 1000

## 2022-06-11 MED ORDER — ACETAMINOPHEN 325 MG PO TABS
650.0000 mg | ORAL_TABLET | ORAL | Status: DC | PRN
  Administered 2022-06-11: 650 mg via ORAL
  Filled 2022-06-11: qty 2

## 2022-06-11 MED ORDER — POTASSIUM CHLORIDE CRYS ER 20 MEQ PO TBCR
40.0000 meq | EXTENDED_RELEASE_TABLET | Freq: Every day | ORAL | Status: DC
  Administered 2022-06-11 – 2022-06-12 (×2): 40 meq via ORAL
  Filled 2022-06-11 (×2): qty 2

## 2022-06-11 MED ORDER — POLYETHYL GLYCOL-PROPYL GLYCOL 0.4-0.3 % OP GEL: Freq: Four times a day (QID) | OPHTHALMIC | Status: DC | PRN

## 2022-06-11 MED ORDER — VITAMIN D 25 MCG (1000 UNIT) PO TABS
10000.0000 [IU] | ORAL_TABLET | Freq: Every day | ORAL | Status: DC
  Administered 2022-06-12: 10000 [IU] via ORAL
  Filled 2022-06-11 (×3): qty 10

## 2022-06-11 MED ORDER — ALPRAZOLAM 0.25 MG PO TABS: 0.2500 mg | ORAL_TABLET | Freq: Two times a day (BID) | ORAL | Status: DC | PRN

## 2022-06-11 MED ORDER — HYDROMORPHONE HCL 1 MG/ML IJ SOLN
0.2500 mg | INTRAMUSCULAR | Status: DC | PRN
  Administered 2022-06-11: 0.5 mg via INTRAVENOUS

## 2022-06-11 MED ORDER — MIDAZOLAM HCL 2 MG/2ML IJ SOLN
INTRAMUSCULAR | Status: DC | PRN
  Administered 2022-06-11: 1 mg via INTRAVENOUS

## 2022-06-11 MED ORDER — PROMETHAZINE HCL 25 MG/ML IJ SOLN: 6.2500 mg | INTRAMUSCULAR | Status: DC | PRN

## 2022-06-11 MED ORDER — VITAMIN B-12 1000 MCG PO TABS
6000.0000 ug | ORAL_TABLET | Freq: Every day | ORAL | Status: DC
  Administered 2022-06-12: 6000 ug via ORAL
  Filled 2022-06-11: qty 6

## 2022-06-11 MED ORDER — SODIUM CHLORIDE 0.9% FLUSH: 3.0000 mL | INTRAVENOUS | Status: DC | PRN

## 2022-06-11 MED ORDER — PROSIGHT PO TABS
1.0000 | ORAL_TABLET | Freq: Two times a day (BID) | ORAL | Status: DC
  Administered 2022-06-11 – 2022-06-12 (×2): 1 via ORAL
  Filled 2022-06-11 (×2): qty 1

## 2022-06-11 MED ORDER — 0.9 % SODIUM CHLORIDE (POUR BTL) OPTIME
TOPICAL | Status: DC | PRN
  Administered 2022-06-11: 1000 mL

## 2022-06-11 MED ORDER — SODIUM CHLORIDE 0.45 % IV SOLN: INTRAVENOUS | Status: DC

## 2022-06-11 MED ORDER — ARTIFICIAL TEARS OPHTHALMIC OINT: TOPICAL_OINTMENT | OPHTHALMIC | Status: DC | PRN

## 2022-06-11 MED ORDER — PROPOFOL 10 MG/ML IV BOLUS
INTRAVENOUS | Status: AC
  Filled 2022-06-11: qty 20

## 2022-06-11 MED ORDER — ROSUVASTATIN CALCIUM 5 MG PO TABS
5.0000 mg | ORAL_TABLET | Freq: Every day | ORAL | Status: DC
  Administered 2022-06-11: 5 mg via ORAL
  Filled 2022-06-11: qty 1

## 2022-06-11 MED ORDER — AMISULPRIDE (ANTIEMETIC) 5 MG/2ML IV SOLN: 10.0000 mg | Freq: Once | INTRAVENOUS | Status: DC | PRN

## 2022-06-11 MED ORDER — MIDAZOLAM HCL 2 MG/2ML IJ SOLN
INTRAMUSCULAR | Status: AC
  Filled 2022-06-11: qty 2

## 2022-06-11 MED ORDER — CHILDRENS CHEW MULTIVITAMIN PO CHEW
1.0000 | CHEWABLE_TABLET | Freq: Every day | ORAL | Status: DC
  Administered 2022-06-11: 1 via ORAL
  Filled 2022-06-11 (×2): qty 1

## 2022-06-11 MED ORDER — CHLORHEXIDINE GLUCONATE 0.12 % MT SOLN
15.0000 mL | Freq: Once | OROMUCOSAL | Status: AC
  Administered 2022-06-11: 15 mL via OROMUCOSAL
  Filled 2022-06-11: qty 15

## 2022-06-11 MED ORDER — OXYCODONE HCL 5 MG PO TABS: 5.0000 mg | ORAL_TABLET | Freq: Once | ORAL | Status: DC | PRN

## 2022-06-11 MED ORDER — AZITHROMYCIN 500 MG PO TABS: 500.0000 mg | ORAL_TABLET | ORAL | Status: DC

## 2022-06-11 MED ORDER — MORPHINE SULFATE (PF) 2 MG/ML IV SOLN: 2.0000 mg | INTRAVENOUS | Status: DC | PRN

## 2022-06-11 MED ORDER — LACTATED RINGERS IV SOLN: INTRAVENOUS | Status: DC

## 2022-06-11 MED ORDER — TIMOLOL MALEATE 0.5 % OP SOLN
1.0000 [drp] | Freq: Two times a day (BID) | OPHTHALMIC | Status: DC
  Administered 2022-06-11 – 2022-06-12 (×2): 1 [drp] via OPHTHALMIC
  Filled 2022-06-11: qty 5

## 2022-06-11 MED ORDER — OMEGA-3-ACID ETHYL ESTERS 1 G PO CAPS
1.0000 g | ORAL_CAPSULE | Freq: Every day | ORAL | Status: DC
  Administered 2022-06-11 – 2022-06-12 (×2): 1 g via ORAL
  Filled 2022-06-11 (×2): qty 1

## 2022-06-11 MED ORDER — PHENYLEPHRINE 80 MCG/ML (10ML) SYRINGE FOR IV PUSH (FOR BLOOD PRESSURE SUPPORT)
PREFILLED_SYRINGE | INTRAVENOUS | Status: DC | PRN
  Administered 2022-06-11 (×3): 80 ug via INTRAVENOUS
  Administered 2022-06-11: 160 ug via INTRAVENOUS

## 2022-06-11 MED ORDER — SODIUM CHLORIDE 0.9% FLUSH
3.0000 mL | Freq: Two times a day (BID) | INTRAVENOUS | Status: DC
  Administered 2022-06-11: 3 mL via INTRAVENOUS

## 2022-06-11 MED ORDER — LOSARTAN POTASSIUM 50 MG PO TABS
25.0000 mg | ORAL_TABLET | Freq: Every day | ORAL | Status: DC
  Administered 2022-06-11 – 2022-06-12 (×2): 25 mg via ORAL
  Filled 2022-06-11 (×2): qty 1

## 2022-06-11 MED ORDER — OXYCODONE HCL 5 MG PO TABS: 10.0000 mg | ORAL_TABLET | ORAL | Status: DC | PRN

## 2022-06-11 MED ORDER — HYDROMORPHONE HCL 1 MG/ML IJ SOLN
INTRAMUSCULAR | Status: AC
  Filled 2022-06-11: qty 1

## 2022-06-11 MED ORDER — METHOCARBAMOL 1000 MG/10ML IJ SOLN: 500.0000 mg | Freq: Four times a day (QID) | INTRAVENOUS | Status: DC | PRN

## 2022-06-11 MED ORDER — LATANOPROST 0.005 % OP SOLN
1.0000 [drp] | Freq: Every day | OPHTHALMIC | Status: DC
  Administered 2022-06-11: 1 [drp] via OPHTHALMIC
  Filled 2022-06-11: qty 2.5

## 2022-06-11 MED ORDER — FENTANYL CITRATE (PF) 250 MCG/5ML IJ SOLN
INTRAMUSCULAR | Status: DC | PRN
  Administered 2022-06-11: 50 ug via INTRAVENOUS
  Administered 2022-06-11: 100 ug via INTRAVENOUS
  Administered 2022-06-11: 50 ug via INTRAVENOUS

## 2022-06-11 MED ORDER — PHENOL 1.4 % MT LIQD
1.0000 | OROMUCOSAL | Status: DC | PRN
  Filled 2022-06-11: qty 177

## 2022-06-11 MED ORDER — BUPIVACAINE HCL 0.25 % IJ SOLN
INTRAMUSCULAR | Status: DC | PRN
  Administered 2022-06-11: 6 mL

## 2022-06-11 MED ORDER — FUROSEMIDE 20 MG PO TABS
20.0000 mg | ORAL_TABLET | Freq: Two times a day (BID) | ORAL | Status: DC
  Administered 2022-06-11 – 2022-06-12 (×2): 20 mg via ORAL
  Filled 2022-06-11 (×2): qty 1

## 2022-06-11 MED ORDER — BUPIVACAINE HCL (PF) 0.25 % IJ SOLN
INTRAMUSCULAR | Status: AC
  Filled 2022-06-11: qty 30

## 2022-06-11 MED ORDER — PHENYLEPHRINE 80 MCG/ML (10ML) SYRINGE FOR IV PUSH (FOR BLOOD PRESSURE SUPPORT)
PREFILLED_SYRINGE | INTRAVENOUS | Status: AC
  Filled 2022-06-11: qty 10

## 2022-06-11 MED ORDER — HYDROXYCHLOROQUINE SULFATE 200 MG PO TABS
200.0000 mg | ORAL_TABLET | Freq: Two times a day (BID) | ORAL | Status: DC
  Administered 2022-06-11 – 2022-06-12 (×2): 200 mg via ORAL
  Filled 2022-06-11 (×2): qty 1

## 2022-06-11 MED ORDER — SERTRALINE HCL 100 MG PO TABS
100.0000 mg | ORAL_TABLET | Freq: Every day | ORAL | Status: DC
  Administered 2022-06-12: 100 mg via ORAL
  Filled 2022-06-11: qty 1

## 2022-06-11 MED ORDER — PROPOFOL 10 MG/ML IV BOLUS
INTRAVENOUS | Status: DC | PRN
  Administered 2022-06-11: 150 mg via INTRAVENOUS

## 2022-06-11 MED ORDER — CEFAZOLIN SODIUM-DEXTROSE 2-4 GM/100ML-% IV SOLN
2.0000 g | INTRAVENOUS | Status: AC
  Administered 2022-06-11: 2 g via INTRAVENOUS
  Filled 2022-06-11: qty 100

## 2022-06-11 MED ORDER — ORAL CARE MOUTH RINSE: 15.0000 mL | Freq: Once | OROMUCOSAL | Status: AC

## 2022-06-11 MED ORDER — OXYCODONE HCL 5 MG PO TABS
5.0000 mg | ORAL_TABLET | ORAL | Status: DC | PRN
  Administered 2022-06-11 – 2022-06-12 (×4): 5 mg via ORAL
  Filled 2022-06-11 (×4): qty 1

## 2022-06-11 MED ORDER — MENTHOL 3 MG MT LOZG
1.0000 | LOZENGE | OROMUCOSAL | Status: DC | PRN
  Administered 2022-06-11: 3 mg via ORAL
  Filled 2022-06-11 (×2): qty 9

## 2022-06-11 MED ORDER — ONDANSETRON HCL 4 MG PO TABS: 4.0000 mg | ORAL_TABLET | Freq: Four times a day (QID) | ORAL | Status: DC | PRN

## 2022-06-11 MED ORDER — FLUTICASONE PROPIONATE 50 MCG/ACT NA SUSP
2.0000 | Freq: Every day | NASAL | Status: DC
  Administered 2022-06-11: 2 via NASAL
  Filled 2022-06-11: qty 16

## 2022-06-11 MED ORDER — SUGAMMADEX SODIUM 200 MG/2ML IV SOLN
INTRAVENOUS | Status: DC | PRN
  Administered 2022-06-11: 400 mg via INTRAVENOUS

## 2022-06-11 MED ORDER — EPHEDRINE 5 MG/ML INJ
INTRAVENOUS | Status: AC
  Filled 2022-06-11: qty 5

## 2022-06-11 MED ORDER — ONDANSETRON HCL 4 MG/2ML IJ SOLN: 4.0000 mg | Freq: Four times a day (QID) | INTRAMUSCULAR | Status: DC | PRN

## 2022-06-11 MED ORDER — SODIUM CHLORIDE 0.9 % IV SOLN
250.0000 mL | INTRAVENOUS | Status: DC
  Administered 2022-06-11: 250 mL via INTRAVENOUS

## 2022-06-11 MED ORDER — CELECOXIB 200 MG PO CAPS
200.0000 mg | ORAL_CAPSULE | Freq: Two times a day (BID) | ORAL | Status: DC
  Administered 2022-06-11 – 2022-06-12 (×3): 200 mg via ORAL
  Filled 2022-06-11 (×3): qty 1

## 2022-06-11 SURGICAL SUPPLY — 55 items
AGENT HMST KT MTR STRL THRMB (HEMOSTASIS)
APL SKNCLS STERI-STRIP NONHPOA (GAUZE/BANDAGES/DRESSINGS) ×1
BAG COUNTER SPONGE SURGICOUNT (BAG) ×1 IMPLANT
BAG SPNG CNTER NS LX DISP (BAG) ×1
BENZOIN TINCTURE PRP APPL 2/3 (GAUZE/BANDAGES/DRESSINGS) ×1 IMPLANT
BIT DRILL SM SPINE QC 12 (BIT) IMPLANT
BLADE CLIPPER SURG (BLADE) IMPLANT
BONE CC-ACS 11X14 X8 6D (Bone Implant) ×2 IMPLANT
BUR ROUND FLUTED 4 SOFT TCH (BURR) ×1 IMPLANT
CHIPS BONE CANC-ACS 11X14X8 6D (Bone Implant) IMPLANT
CLSR STERI-STRIP ANTIMIC 1/2X4 (GAUZE/BANDAGES/DRESSINGS) IMPLANT
COLLAR CERV LO CONTOUR FIRM DE (SOFTGOODS) IMPLANT
CORD BIPOLAR FORCEPS 12FT (ELECTRODE) ×1 IMPLANT
COVER SURGICAL LIGHT HANDLE (MISCELLANEOUS) ×1 IMPLANT
DRAPE C-ARM 42X72 X-RAY (DRAPES) ×1 IMPLANT
DRAPE HALF SHEET 40X57 (DRAPES) ×1 IMPLANT
DRAPE MICROSCOPE SLANT 54X150 (MISCELLANEOUS) ×1 IMPLANT
DURAPREP 6ML APPLICATOR 50/CS (WOUND CARE) ×1 IMPLANT
ELECT COATED BLADE 2.86 ST (ELECTRODE) ×1 IMPLANT
ELECT REM PT RETURN 9FT ADLT (ELECTROSURGICAL) ×1
ELECTRODE REM PT RTRN 9FT ADLT (ELECTROSURGICAL) ×1 IMPLANT
EVACUATOR 1/8 PVC DRAIN (DRAIN) ×1 IMPLANT
GAUZE SPONGE 4X4 12PLY STRL (GAUZE/BANDAGES/DRESSINGS) ×1 IMPLANT
GLOVE BIOGEL PI IND STRL 8 (GLOVE) ×2 IMPLANT
GLOVE ORTHO TXT STRL SZ7.5 (GLOVE) ×2 IMPLANT
GOWN STRL REUS W/ TWL LRG LVL3 (GOWN DISPOSABLE) ×1 IMPLANT
GOWN STRL REUS W/ TWL XL LVL3 (GOWN DISPOSABLE) ×1 IMPLANT
GOWN STRL REUS W/TWL 2XL LVL3 (GOWN DISPOSABLE) ×1 IMPLANT
GOWN STRL REUS W/TWL LRG LVL3 (GOWN DISPOSABLE) ×1
GOWN STRL REUS W/TWL XL LVL3 (GOWN DISPOSABLE) ×1
HALTER HD/CHIN CERV TRACTION D (MISCELLANEOUS) ×1 IMPLANT
HEMOSTAT SURGICEL 2X14 (HEMOSTASIS) IMPLANT
KIT BASIN OR (CUSTOM PROCEDURE TRAY) ×1 IMPLANT
KIT TURNOVER KIT B (KITS) ×1 IMPLANT
NDL 25GX 5/8IN NON SAFETY (NEEDLE) ×1 IMPLANT
NEEDLE 25GX 5/8IN NON SAFETY (NEEDLE) ×1 IMPLANT
NS IRRIG 1000ML POUR BTL (IV SOLUTION) ×1 IMPLANT
PACK ORTHO CERVICAL (CUSTOM PROCEDURE TRAY) ×1 IMPLANT
PAD ARMBOARD 7.5X6 YLW CONV (MISCELLANEOUS) ×2 IMPLANT
PATTIES SURGICAL .5 X.5 (GAUZE/BANDAGES/DRESSINGS) IMPLANT
PIN TEMP FIXATION KIRSCHNER (EXFIX) IMPLANT
PLATE ANT CERV XTEND 3 LV 48 (Plate) IMPLANT
POSITIONER HEAD DONUT 9IN (MISCELLANEOUS) ×1 IMPLANT
RESTRAINT LIMB HOLDER UNIV (RESTRAINTS) IMPLANT
SCREW XTD VAR 4.2 SELF TAP 12 (Screw) IMPLANT
STRIP CLOSURE SKIN 1/2X4 (GAUZE/BANDAGES/DRESSINGS) ×1 IMPLANT
SURGIFLO W/THROMBIN 8M KIT (HEMOSTASIS) IMPLANT
SUT BONE WAX W31G (SUTURE) ×1 IMPLANT
SUT VIC AB 3-0 X1 27 (SUTURE) ×1 IMPLANT
SUT VICRYL 4-0 PS2 18IN ABS (SUTURE) ×2 IMPLANT
TAPE CLOTH SURG 4X10 WHT LF (GAUZE/BANDAGES/DRESSINGS) IMPLANT
TOWEL GREEN STERILE (TOWEL DISPOSABLE) ×1 IMPLANT
TOWEL GREEN STERILE FF (TOWEL DISPOSABLE) ×1 IMPLANT
TRAY FOLEY W/BAG SLVR 16FR (SET/KITS/TRAYS/PACK)
TRAY FOLEY W/BAG SLVR 16FR ST (SET/KITS/TRAYS/PACK) IMPLANT

## 2022-06-11 NOTE — Progress Notes (Signed)
Orthopedic Tech Progress Note Patient Details:  Taylor Mccall 04-10-1945 [id number]  Extra collar PER MD  Ortho Devices Type of Ortho Device: Soft collar Ortho Device/Splint Location: NECK Ortho Device/Splint Interventions: Ordered   Post Interventions Patient Tolerated: Well Instructions Provided: Care of device  Donald Pore 06/11/2022, 1:36 PM

## 2022-06-11 NOTE — Anesthesia Procedure Notes (Signed)
Procedure Name: Intubation Date/Time: 06/11/2022 7:48 AM  Performed by: Orlin Hilding, CRNAPre-anesthesia Checklist: Patient identified, Emergency Drugs available, Suction available, Patient being monitored and Timeout performed Patient Re-evaluated:Patient Re-evaluated prior to induction Oxygen Delivery Method: Circle system utilized Preoxygenation: Pre-oxygenation with 100% oxygen Induction Type: IV induction Ventilation: Mask ventilation without difficulty and Oral airway inserted - appropriate to patient size Laryngoscope Size: Glidescope and 3 Grade View: Grade I Tube type: Oral Tube size: 7.0 mm Number of attempts: 1 Placement Confirmation: ETT inserted through vocal cords under direct vision, positive ETCO2 and breath sounds checked- equal and bilateral Secured at: 22 cm Tube secured with: Tape Dental Injury: Teeth and Oropharynx as per pre-operative assessment

## 2022-06-11 NOTE — Plan of Care (Signed)

## 2022-06-11 NOTE — Transfer of Care (Signed)
Immediate Anesthesia Transfer of Care Note  Patient: Taylor Mccall  Procedure(s) Performed: ANTERIOR CERVICAL DISCECTOMY FUSION C3-4, C5-6 WITH ALLOGRAFT, PLATE  Patient Location: PACU  Anesthesia Type:General  Level of Consciousness: oriented, drowsy, and patient cooperative  Airway & Oxygen Therapy: Patient Spontanous Breathing and Patient connected to face mask oxygen  Post-op Assessment: Report given to RN and Post -op Vital signs reviewed and stable  Post vital signs: Reviewed and stable  Last Vitals:  Vitals Value Taken Time  BP 130/66 06/11/22 1115  Temp    Pulse 74 06/11/22 1116  Resp 21 06/11/22 1116  SpO2 99 % 06/11/22 1116  Vitals shown include unvalidated device data.  Last Pain:  Vitals:   06/11/22 0630  PainSc: 6          Complications: No notable events documented.

## 2022-06-11 NOTE — Op Note (Signed)
Pre and postop diagnosis: Cervical spondylosis with stenosis C3-4, C5-6.  Procedure: Two-level cervical fusion C3-4, C5-6 with allograft and plate.  Surgeon: Annell Greening, MD  Assistant: Willia Craze, MD  Anesthesia: GOT +6 cc Marcaine skin local.  Drains 1 Hemovac neck  Implants:plants  BONE CC-ACS 11X14 X8 6D - [id number]  Inventory Item: BONE CC-ACS 11X14 X8 6D Serial no.: [id number] Model/Cat no.: [id number]  Implant name: BONE CC-ACS 11X14 X8 6D - [id number] Laterality: N/A Area: Spine Cervical  Manufacturer: [employer] CERVICAL BONE SPACERS Date of Manufacture:   Action: Implanted Number Used: 1   Device Identifier: Device Identifier Type:   BONE CC-ACS 11X14 X8 6D - [id number]  Inventory Item: BONE CC-ACS 11X14 X8 6D Serial no.: [id number] Model/Cat no.: [id number]  Implant name: BONE CC-ACS 11X14 X8 6D - [id number] Laterality: N/A Area: Spine Cervical  Manufacturer: [employer] CERVICAL BONE SPACERS Date of Manufacture:   Action: Implanted Number Used: 1   Device Identifier: Device Identifier Type:   SCREW XTD VAR 4.2 SELF TAP 12 - [id number]  Inventory Item: SCREW XTD VAR 4.2 SELF TAP 12 Serial no.: Model/Cat no.: [id number]  Implant name: SCREW XTD VAR 4.2 SELF TAP 12 - [id number] Laterality: N/A Area: Spine Cervical  Manufacturer: [employer] Date of Manufacture:   Action: Implanted Number Used: 6   Device Identifier: Device Identifier Type:   PLATE ANT CERV XTEND 3 LV 48 - [id number]  Inventory Item: PLATE ANT CERV XTEND 3 LV 48 Serial no.: Model/Cat no.: [id number]  Implant name: PLATE ANT CERV XTEND 3 LV 48 - [id number] Laterality: N/A Area: Spine Cervical  Manufacturer: [employer] Date of Manufacture:   Action: Implanted Number Used: 1   Device Identifier: Device Identifier Type:    Trays  Tray Name: [id number] - NEURO DEPUY SKYLINE CERVICAL IMPLANTS - 1   Preop and Intraop Administered Meds from 06/11/2022 0537 to 06/11/2022 1108   Date/Time Order  Dose Route Action Action by Comments   06/11/2022 0830 EDT 0.9 % irrigation (POUR BTL) 1,000 mL Irrigation Given Eldred Manges, MD used prn exp 2027   06/11/2022 1053 EDT bupivacaine (MARCAINE) 0.25 % (with pres) injection 6 mL Infiltration Given Eldred Manges, MD --   06/11/2022 0749 EDT ceFAZolin (ANCEF) IVPB 2g/100 mL premix 2 g Intravenous Given    Globus instrumentation.  Procedure: After induction general anesthesia input intubation with the glide scope arms tucked at the sides with careful padding to lay on nerve wrist restraints a gel bag underneath the shoulder blades had ultra traction applied but no weight next prepped with DuraPrep there is report of foul sterile skin marker Betadine Steri-Drape and usual sheets and draped with sterile Mayo stand at the head.  After timeout procedure preoperative Ancef prophylaxis incision was made midline extending to the left.  Platysma was divided in line with the fibers after thicker layer of adipose tissue was present.  Dr. Willia Craze was assisting since patient had autofusion at C4-5 and was refusing 9 adjacent levels in the thick neck with severe stenosis narrowing of the cord less than 5 mm.  Midline was identified and short 25 needle was placed in the cervical 3-4 level crosstable C arm sterilely draped photo was taken confirming appropriate level.  Cloward self-treatment tainting tractors were placed these plates right and left smooth bed cephalad caudad operative microscope was draped and brought in spurs removed anteriorly and there was some overhanging spurs there was still motion at the disc base and we progressed back  to the posterior longitudinal ligament with overhanging spurs and it took considerable time microsuction to thin down the spurs so we could finally remove them with a 1 and 2 mm Kerrisons.  Complete decompression of the dura right and left.  Surgiflo had been used intermittently for bone bleeding.  Trial sizers showed 8 mm graft gave  nice tight fit.  Good decompression uncovertebral joints were stripped with had ultra traction applied by CRNA pulling the graft was tapped down flush with the anterior cortex.  Soft and clear retractors were then moved down to the C5-6 level second image was taken confirming localization.  The space was wider but there still was only 1 mm space between the spurs posteriorly and once they were decompressed taken down the nerve decompressed the trial sizers showed again an 8 mm graft fit nicely at this level.  Surgiflo was used intermittently with patties.  Operative field was dry.  Prominent 1 cm spurs that were off to the side had to be removed off of the anterior aspect cervical spine so that the plate would sit down flat.  On the top of C6 anteriorly we had to remove some bone to leave enough room so that the plate with screws could fit solidly in the vertebral body and not be interfered with with spurs off the C6-7 level.  Once plate was placed to problems replaced and single screw adjustments were made and then sick screws were placed.  Screws were placed above and below the C5-6 level since it was a lower level and higher risk for pseudoarthrosis.  Screws were placed in the C3 vertebral body.  There was no motion at C4-5 it was directly visualized and was already autofused.  Final spot pictures were taken confirmation tiny screwdriver was used to lock all screws down.  Repeat irrigation Hemovac drain used with an In-N-Out technique on the left side using the trocar.  Platysma closed with 3-0 Vicryl 4-0 Vicryl subcuticular closure tincture benzoin Steri-Strips 6 cc Marcaine infiltration 4 x 4's tape and soft cervical collar.  Instrument count needle count was correct patient was sent tolerated the procedure well transferred recovery in stable condition.

## 2022-06-11 NOTE — Interval H&P Note (Signed)
History and Physical Interval Note:  06/11/2022 7:24 AM  Taylor Mccall  has presented today for surgery, with the diagnosis of cervical stenosis C3-4, C5-6.  The various methods of treatment have been discussed with the patient and family. After consideration of risks, benefits and other options for treatment, the patient has consented to  Procedure(s): ANTERIOR CERVICAL DISCECTOMY FUSION C3-4, C5-6 WITH ALLOGRAFT, PLATE (N/A) as a surgical intervention.  The patient's history has been reviewed, patient examined, no change in status, stable for surgery.  I have reviewed the patient's chart and labs.  Questions were answered to the patient's satisfaction.     Eldred Manges

## 2022-06-11 NOTE — Anesthesia Postprocedure Evaluation (Signed)
Anesthesia Post Note  Patient: Taylor Mccall  Procedure(s) Performed: ANTERIOR CERVICAL DISCECTOMY FUSION C3-4, C5-6 WITH ALLOGRAFT, PLATE     Patient location during evaluation: PACU Anesthesia Type: General Level of consciousness: awake and alert Pain management: pain level controlled Vital Signs Assessment: post-procedure vital signs reviewed and stable Respiratory status: spontaneous breathing, nonlabored ventilation and respiratory function stable Cardiovascular status: blood pressure returned to baseline and stable Postop Assessment: no apparent nausea or vomiting Anesthetic complications: no   No notable events documented.  Last Vitals:  Vitals:   06/11/22 1200 06/11/22 1215  BP: (!) 106/90 123/67  Pulse: 80 73  Resp: 17 19  Temp:    SpO2: 92% 91%    Last Pain:  Vitals:   06/11/22 1215  PainSc: Asleep                 Lowella Curb

## 2022-06-12 ENCOUNTER — Encounter (HOSPITAL_COMMUNITY): Payer: Self-pay | Admitting: Orthopaedic Surgery

## 2022-06-12 DIAGNOSIS — M47892 Other spondylosis, cervical region: Secondary | ICD-10-CM | POA: Diagnosis not present

## 2022-06-12 MED ORDER — OXYCODONE-ACETAMINOPHEN 5-325 MG PO TABS: 1.0000 | ORAL_TABLET | Freq: Four times a day (QID) | ORAL | 0 refills | Status: AC | PRN

## 2022-06-12 MED ORDER — ADULT MULTIVITAMIN W/MINERALS CH
1.0000 | ORAL_TABLET | Freq: Every day | ORAL | Status: DC
  Administered 2022-06-12: 1 via ORAL
  Filled 2022-06-12: qty 1

## 2022-06-12 NOTE — Evaluation (Signed)
Physical Therapy Evaluation  Patient Details Name: Taylor Mccall MRN: [id number] DOB: 18-Jul-1945 Today's Date: 06/12/2022  History of Present Illness  Pt is a 77 y/o F s/p ACDF.  PMH includes: B TKR, L THA, RA, lymphedema  Clinical Impression  Pt admitted with above diagnosis. At the time of PT eval, pt was able to demonstrate transfers with modified independence and ambulation with gross min guard assist and RW for support. Pt was educated on precautions, brace application/wearing schedule, appropriate activity progression, and car transfer. Pt currently with functional limitations due to the deficits listed below (see PT Problem List). Pt will benefit from skilled PT to increase their independence and safety with mobility to allow discharge to the venue listed below.         Recommendations for follow up therapy are one component of a multi-disciplinary discharge planning process, led by the attending physician.  Recommendations may be updated based on patient status, additional functional criteria and insurance authorization.  Follow Up Recommendations       Assistance Recommended at Discharge PRN  Patient can return home with the following  A little help with walking and/or transfers;A little help with bathing/dressing/bathroom;Assistance with cooking/housework;Help with stairs or ramp for entrance;Assist for transportation    Equipment Recommendations Rolling walker (2 wheels)  Recommendations for Other Services       Functional Status Assessment Patient has had a recent decline in their functional status and demonstrates the ability to make significant improvements in function in a reasonable and predictable amount of time.     Precautions / Restrictions Precautions Precautions: Cervical Precaution Booklet Issued: Yes (comment) Precaution Comments: Reviewed handout and pt was cued for precautions during functional mobility. Required Braces or Orthoses: Cervical  Brace Cervical Brace: Soft collar;At all times Restrictions Weight Bearing Restrictions: No      Mobility  Bed Mobility Overal bed mobility: Modified Independent             General bed mobility comments: Pt typically sleeps in a recliner and bed was set with HOB elevated. Pt was able to swing legs around to sit EOB without difficulty.    Transfers Overall transfer level: Modified independent Equipment used: Rolling walker (2 wheels)               General transfer comment: Pt demonstrated good hand placement on seated surface for safety.    Ambulation/Gait Ambulation/Gait assistance: Min guard Gait Distance (Feet): 200 Feet Assistive device: Rolling walker (2 wheels) Gait Pattern/deviations: Step-through pattern, Decreased stride length, Trunk flexed Gait velocity: Decreased Gait velocity interpretation: 1.31 - 2.62 ft/sec, indicative of limited community ambulator   General Gait Details: VC's for improved posture, closer walker proximity, and forward gaze. No assist required but close guard provided throughout for safety.  Stairs Stairs: Yes Stairs assistance: Min guard Stair Management: Two rails, Alternating pattern, Forwards Number of Stairs: 3 General stair comments: VC's for sequencing and general safety.  Wheelchair Mobility    Modified Rankin (Stroke Patients Only)       Balance Overall balance assessment: Mild deficits observed, not formally tested                                           Pertinent Vitals/Pain Pain Assessment Pain Assessment: Faces Faces Pain Scale: Hurts a little bit Pain Location: Neck Pain Descriptors / Indicators: Operative site guarding, Sore Pain Intervention(s): Limited  activity within patient's tolerance, Monitored during session, Repositioned    Home Living Family/patient expects to be discharged to:: Private residence Living Arrangements: Children Available Help at Discharge:  Family;Available PRN/intermittently Type of Home: House Home Access: Stairs to enter Entrance Stairs-Rails: Right Entrance Stairs-Number of Steps: 2 Alternate Level Stairs-Number of Steps: 13 with a stair lift Home Layout: Two level;Bed/bath upstairs Home Equipment: Rollator (4 wheels);Cane - single point;Adaptive equipment;Hand held shower head;Grab bars - tub/shower;Shower seat Additional Comments: Lift chair and stair lift    Prior Function Prior Level of Function : Independent/Modified Independent                     Hand Dominance   Dominant Hand: Right    Extremity/Trunk Assessment   Upper Extremity Assessment Upper Extremity Assessment: Defer to OT evaluation    Lower Extremity Assessment Lower Extremity Assessment: Generalized weakness    Cervical / Trunk Assessment Cervical / Trunk Assessment: Neck Surgery  Communication   Communication: No difficulties  Cognition Arousal/Alertness: Awake/alert Behavior During Therapy: WFL for tasks assessed/performed Overall Cognitive Status: Within Functional Limits for tasks assessed                                          General Comments      Exercises     Assessment/Plan    PT Assessment Patient needs continued PT services  PT Problem List Decreased strength;Decreased activity tolerance;Decreased balance;Decreased mobility;Decreased knowledge of use of DME;Decreased safety awareness;Decreased knowledge of precautions;Pain       PT Treatment Interventions DME instruction;Gait training;Stair training;Functional mobility training;Therapeutic activities;Therapeutic exercise;Balance training;Patient/family education    PT Goals (Current goals can be found in the Care Plan section)  Acute Rehab PT Goals Patient Stated Goal: Home today PT Goal Formulation: With patient Time For Goal Achievement: 06/19/22 Potential to Achieve Goals: Good    Frequency Min 5X/week     Co-evaluation                AM-PAC PT "6 Clicks" Mobility  Outcome Measure Help needed turning from your back to your side while in a flat bed without using bedrails?: None Help needed moving from lying on your back to sitting on the side of a flat bed without using bedrails?: None Help needed moving to and from a bed to a chair (including a wheelchair)?: A Little Help needed standing up from a chair using your arms (e.g., wheelchair or bedside chair)?: None Help needed to walk in hospital room?: A Little Help needed climbing 3-5 steps with a railing? : A Little 6 Click Score: 21    End of Session Equipment Utilized During Treatment: Gait belt;Cervical collar Activity Tolerance: Patient tolerated treatment well Patient left: in bed;with call bell/phone within reach Nurse Communication: Mobility status PT Visit Diagnosis: Unsteadiness on feet (R26.81);Pain Pain - part of body:  (neck)    Time: 5409-8119 PT Time Calculation (min) (ACUTE ONLY): 14 min   Charges:   PT Evaluation $PT Eval Low Complexity: 1 Low          Conni Slipper, PT, DPT Acute Rehabilitation Services Secure Chat Preferred Office: [phone]   Marylynn Pearson 06/12/2022, 9:57 AM

## 2022-06-12 NOTE — Evaluation (Signed)
Occupational Therapy Evaluation Patient Details Name: Taylor Mccall MRN: [id number] DOB: 09-17-1945 Today's Date: 06/12/2022   History of Present Illness 77 yo F s/p ACDF.  PMH includes: B TKR, L THA, RA, lymphedema   Clinical Impression   Patient admitted for the diagnosis above.  PTA she lives at home with her daughter, who works during the day, but is available for PRN assist.  Patient has a 4WRW at home, but would benefit from a 2WRW and a 3n1 for home use.  She is very close to her baseline status for ADL completion and in room mobility/toileting.  Precautions reviewed, ADL completed and mobility performed.  No further OT needs in the acute setting.  Recommend follow up as prescribed by MD.         Recommendations for follow up therapy are one component of a multi-disciplinary discharge planning process, led by the attending physician.  Recommendations may be updated based on patient status, additional functional criteria and insurance authorization.   Assistance Recommended at Discharge Intermittent Supervision/Assistance  Patient can return home with the following Assist for transportation;Assistance with cooking/housework;A little help with bathing/dressing/bathroom    Functional Status Assessment  Patient has not had a recent decline in their functional status  Equipment Recommendations  BSC/3in1    Recommendations for Other Services       Precautions / Restrictions Precautions Precautions: Cervical Precaution Booklet Issued: Yes (comment) Required Braces or Orthoses: Cervical Brace Cervical Brace: Soft collar;At all times Restrictions Weight Bearing Restrictions: No      Mobility Bed Mobility Overal bed mobility: Modified Independent                  Transfers Overall transfer level: Modified independent Equipment used: Rolling walker (2 wheels)               General transfer comment: supervision for sit to stand initially      Balance  Overall balance assessment: Mild deficits observed, not formally tested                                         ADL either performed or assessed with clinical judgement   ADL Overall ADL's : At baseline                                             Vision Patient Visual Report: No change from baseline       Perception     Praxis      Pertinent Vitals/Pain Pain Assessment Pain Assessment: No/denies pain     Hand Dominance Right   Extremity/Trunk Assessment Upper Extremity Assessment Upper Extremity Assessment: Overall WFL for tasks assessed   Lower Extremity Assessment Lower Extremity Assessment: Defer to PT evaluation   Cervical / Trunk Assessment Cervical / Trunk Assessment: Neck Surgery   Communication Communication Communication: No difficulties   Cognition Arousal/Alertness: Awake/alert Behavior During Therapy: WFL for tasks assessed/performed Overall Cognitive Status: Within Functional Limits for tasks assessed                                       General Comments       Exercises     Shoulder Instructions  Home Living Family/patient expects to be discharged to:: Private residence Living Arrangements: Children Available Help at Discharge: Family;Available PRN/intermittently Type of Home: House Home Access: Stairs to enter [employer] of Steps: 2 Entrance Stairs-Rails: Right Home Layout: Two level;Bed/bath upstairs     Bathroom Shower/Tub: Producer, television/film/video: Standard Bathroom Accessibility: Yes How Accessible: Accessible via walker Home Equipment: Rollator (4 wheels);Cane - single point;Adaptive equipment;Hand held shower head;Grab bars - tub/shower;Shower Psychologist, counselling Comments: Lift chair and stair lift      Prior Functioning/Environment Prior Level of Function : Independent/Modified Independent                         OT Problem List: Decreased strength      OT Treatment/Interventions:      OT Goals(Current goals can be found in the care plan section) Acute Rehab OT Goals Patient Stated Goal: Return home OT Goal Formulation: With patient Time For Goal Achievement: 06/15/22 Potential to Achieve Goals: Good  OT Frequency:      Co-evaluation              AM-PAC OT "6 Clicks" Daily Activity     Outcome Measure Help from another person eating meals?: None Help from another person taking care of personal grooming?: None Help from another person toileting, which includes using toliet, bedpan, or urinal?: None Help from another person bathing (including washing, rinsing, drying)?: A Little Help from another person to put on and taking off regular upper body clothing?: None Help from another person to put on and taking off regular lower body clothing?: A Little 6 Click Score: 22   End of Session Equipment Utilized During Treatment: Rolling walker (2 wheels) Nurse Communication: Mobility status  Activity Tolerance: Patient tolerated treatment well Patient left: in bed;with call bell/phone within reach  OT Visit Diagnosis: Unsteadiness on feet (R26.81)                Time: 2956-2130 OT Time Calculation (min): 21 min Charges:  OT General Charges $OT Visit: 1 Visit OT Evaluation $OT Eval Moderate Complexity: 1 Mod  06/12/2022  RP, OTR/L  Acute Rehabilitation Services  Office:  [phone]   Suzanna Obey 06/12/2022, 8:49 AM

## 2022-06-12 NOTE — Discharge Summary (Signed)
Physician Discharge Summary  Patient ID: Taylor Mccall MRN: [id number] DOB/AGE: [birth year] 77 y.o.  Admit date: 06/11/2022 Discharge date: 06/12/2022  Admission Diagnoses: C3-4, C5-6 cervical stenosis.  Previous autofusion C4-5  Discharge Diagnoses: Same Principal Problem:   S/P cervical spinal fusion Active Problems:   Spinal stenosis of cervical region   Discharged Condition: good  Hospital Course: Patient had autofusion at C4-5 with symptomatic cervical stenosis at C3-4 and C5-6.  Failed conservative treatment patient was admitted underwent two-level cervical fusion with single plate and allograft placed at both the C3-4 and C5-6 level 8 mm height lordotic grafts.  Patient tolerated the procedure well postop drain was removed postop day 1.  Extra collar given that she will wrapped in Saran wrap to take shower.  Office follow-up 1 week.  Patient noted improvement in her leg strength and improve walking less tingling in her legs after the procedure and was discharged after ambulating safely.  Consults: Occupational Therapy, physical therapy.  Significant Diagnostic Studies: Fluoroscopic images demonstrated satisfactory two-level cervical fusion C3-4 and C5-6.  Treatments: Surgery as listed above  Discharge Exam: Blood pressure (!) 109/58, pulse 67, temperature 98 F (36.7 C), temperature source Oral, resp. rate 18, height 5\' 10"  (1.778 m), weight 118.8 kg, SpO2 99 %. Patient safely ambulatory and discharged in soft collar.  Good leg strength.  Disposition: Discharge disposition: 01-Home or Self Care       Discharge Instructions     Incentive spirometry RT   Complete by: As directed       Allergies as of 06/12/2022       Reactions   Penicillins Rash        Medication List     TAKE these medications    ALPRAZolam 0.25 MG tablet Commonly known as: XANAX Take 1 tablet (0.25 mg total) by mouth 2 (two) times daily as needed for anxiety.   aspirin EC 81 MG  tablet Take 1 tablet (81 mg total) by mouth 2 (two) times daily. Swallow whole. What changed:  when to take this additional instructions   azithromycin 500 MG tablet Commonly known as: ZITHROMAX Take 500-1,000 mg by mouth See admin instructions. 1000 mg 1 hour prior to dental appointment and 500 mg 6 hours after dental appointment.   brimonidine 0.2 % ophthalmic solution Commonly known as: ALPHAGAN Place 1 drop into both eyes 2 (two) times daily.   Fish Oil 1200 MG Caps Take 2,400 mg by mouth daily.   fluticasone 50 MCG/ACT nasal spray Commonly known as: FLONASE Place 2 sprays into both nostrils daily.   furosemide 20 MG tablet Commonly known as: LASIX TAKE 1 TABLET BY MOUTH 2 TIMES DAILY. MORNING AND MIDDAY What changed: See the new instructions.   hydroxychloroquine 200 MG tablet Commonly known as: PLAQUENIL Take 1 tablet (200 mg total) by mouth 2 (two) times daily.   Klor-Con M20 20 MEQ tablet Generic drug: potassium chloride SA TAKE 2 TABLETS (40 MEQ TOTAL) BY MOUTH EVERY MORNING. What changed: See the new instructions.   latanoprost 0.005 % ophthalmic solution Commonly known as: XALATAN Place 1 drop into both eyes at bedtime.   losartan 25 MG tablet Commonly known as: COZAAR TAKE 1 TABLET (25 MG TOTAL) BY MOUTH DAILY. KEEP APPT FOR REFILLS What changed: additional instructions   naproxen sodium 220 MG tablet Commonly known as: ALEVE Take 440 mg by mouth daily as needed (pain).   oxyCODONE-acetaminophen 5-325 MG tablet Commonly known as: Percocet Take 1-2 tablets by mouth every 6 (  six) hours as needed for severe pain.   pantoprazole 40 MG tablet Commonly known as: PROTONIX TAKE ONE TABLET BY MOUTH ONCE DAILY BEFORE BREAKFAST What changed: See the new instructions.   PreserVision AREDS 2 Caps Take 1 capsule by mouth 2 (two) times daily.   Multivitamin Gummies Womens [employer] 2 each by mouth daily.   rosuvastatin 5 MG tablet Commonly known as:  CRESTOR TAKE 1 TABLET BY MOUTH EVERYDAY AT BEDTIME What changed:  how much to take how to take this when to take this additional instructions   sertraline 100 MG tablet Commonly known as: ZOLOFT TAKE 1 TABLET BY MOUTH EVERY DAY IN THE MORNING What changed: See the new instructions.   SYSTANE OP Place 1 drop into both eyes every 6 (six) hours as needed (dry eyes).   timolol 0.5 % ophthalmic solution Commonly known as: TIMOPTIC Place 1 drop into both eyes 2 (two) times daily.   VITAMIN B-12 PO Take 6,000 mcg by mouth daily. Vitamin B-12 3000 mcg chew   Vitamin D3 125 MCG (5000 UT) Chew Chew 10,000 Units by mouth daily.   vitamin E 180 MG (400 UNITS) capsule Take 400 Units by mouth daily.         Signed: Eldred Manges 06/12/2022, 12:56 PM

## 2022-06-12 NOTE — Plan of Care (Signed)
Pt doing well. Pt and daughter given D/C instructions with verbal understanding. Rx's were sent to the pharmacy by MD. Pt's incision is clean and dry with no sign of infection. Pt's IV and Hemovac were removed prior to D/C. Pt received RW and 3-n-1 from [employer] per MD order. Pt D/C'd home via wheelchair per MD order. Pt is stable @ D/C and has no other needs at this time. Rema Fendt, RN

## 2022-06-12 NOTE — Progress Notes (Signed)
Patient ID: Taylor Mccall, female   DOB: 08-08-1945, 77 y.o.   MRN: [id number]   Subjective: 1 Day Post-Op Procedure(s) (LRB): ANTERIOR CERVICAL DISCECTOMY FUSION C3-4, C5-6 WITH ALLOGRAFT, PLATE (N/A) Patient reports pain as mild. " My legs feel stronger"  Objective: Vital signs in last 24 hours: Temp:  [97.5 F (36.4 C)-98.4 F (36.9 C)] 98.4 F (36.9 C) (05/07 0442) Pulse Rate:  [70-80] 77 (05/07 0442) Resp:  [16-20] 20 (05/07 0442) BP: (106-150)/(60-90) 126/67 (05/07 0442) SpO2:  [91 %-100 %] 100 % (05/07 0442)  Intake/Output from previous day: 05/06 0701 - 05/07 0700 In: 1395.3 [P.O.:480; I.V.:915.3] Out: 286 [Urine:1; Drains:85; Blood:200] Intake/Output this shift: No intake/output data recorded.  No results for input(s): "HGB" in the last 72 hours. No results for input(s): "WBC", "RBC", "HCT", "PLT" in the last 72 hours. No results for input(s): "NA", "K", "CL", "CO2", "BUN", "CREATININE", "GLUCOSE", "CALCIUM" in the last 72 hours. No results for input(s): "LABPT", "INR" in the last 72 hours.  Neurologically intact DG Cervical Spine 2 or 3 views  Result Date: 06/11/2022 CLINICAL DATA:  [id number] Elective surgery [id number] EXAM: CERVICAL SPINE - 2-3 VIEW COMPARISON:  Radiograph 04/03/2022 FINDINGS: Intraoperative images during C3-C6 ACDF.  Hardware is intact IMPRESSION: Intraoperative images during C3-C6 ACDF.  Hardware is intact. Electronically Signed   By: Caprice Renshaw M.D.   On: 06/11/2022 14:20   DG C-Arm 1-60 Min-No Report  Result Date: 06/11/2022 Fluoroscopy was utilized by the requesting physician.  No radiographic interpretation.   DG C-Arm 1-60 Min-No Report  Result Date: 06/11/2022 Fluoroscopy was utilized by the requesting physician.  No radiographic interpretation.   DG C-Arm 1-60 Min-No Report  Result Date: 06/11/2022 Fluoroscopy was utilized by the requesting physician.  No radiographic interpretation.    Assessment/Plan: 1 Day Post-Op Procedure(s)  (LRB): ANTERIOR CERVICAL DISCECTOMY FUSION C3-4, C5-6 WITH ALLOGRAFT, PLATE (N/A) Up with therapy, discharge today.   Eldred Manges 06/12/2022, 7:50 AM

## 2022-06-13 ENCOUNTER — Telehealth: Payer: Self-pay

## 2022-06-13 NOTE — Transitions of Care (Post Inpatient/ED Visit) (Signed)
   06/13/2022  Name: Taylor Mccall MRN: [id number] DOB: 1945/03/19  Today's TOC FU Call Status: Today's TOC FU Call Status:: Unsuccessul Call (1st Attempt) Unsuccessful Call (1st Attempt) Date: 06/13/22  Attempted to reach the patient regarding the most recent Inpatient/ED visit.  Follow Up Plan: Additional outreach attempts will be made to reach the patient to complete the Transitions of Care (Post Inpatient/ED visit) call.   Signature  Abby Nilton Lave, CMA  [facility] AWV Team

## 2022-06-18 ENCOUNTER — Other Ambulatory Visit: Payer: Self-pay | Admitting: Adult Health

## 2022-06-19 ENCOUNTER — Encounter: Payer: Self-pay | Admitting: Orthopaedic Surgery

## 2022-06-19 ENCOUNTER — Ambulatory Visit (INDEPENDENT_AMBULATORY_CARE_PROVIDER_SITE_OTHER): Payer: Medicare HMO | Admitting: Orthopaedic Surgery

## 2022-06-19 ENCOUNTER — Other Ambulatory Visit (INDEPENDENT_AMBULATORY_CARE_PROVIDER_SITE_OTHER): Payer: Medicare HMO

## 2022-06-19 DIAGNOSIS — Z981 Arthrodesis status: Secondary | ICD-10-CM

## 2022-06-19 NOTE — Progress Notes (Signed)
Post-Op Visit Note   Patient: Taylor Mccall           Date of Birth: August 24, 1945           MRN: [id number] Visit Date: 06/19/2022 PCP: Shirline Frees, NP   Assessment & Plan: Follow-up two-level cervical fusion C3-4 and C5-6.  She had autofused C4-5 level.  Good relief of preop symptoms.  Patient is using rolling walker but states at home she walks in her house without using the walker and has noticed improvement in her leg strength and balance.  Chief Complaint:  Chief Complaint  Patient presents with   Neck - Routine Post Op    06/11/2022 C3-4, C5-6 ACDF   Visit Diagnoses:  1. Status post cervical spinal fusion     Plan: Return 4 weeks lateral flexion-extension C-spine x-ray on return.  Follow-Up Instructions: No follow-ups on file.   Orders:  Orders Placed This Encounter  Procedures   XR Cervical Spine 2 or 3 views   No orders of the defined types were placed in this encounter.   Imaging: No results found.  PMFS History: Patient Active Problem List   Diagnosis Date Noted   S/P cervical spinal fusion 06/11/2022   Spinal stenosis of cervical region 05/02/2022   Status post revision of total knee replacement, left 11/25/2020   Failed total knee, left, subsequent encounter 11/24/2020   Status post revision of total hip 04/01/2020   Polyethylene liner wear following total hip arthroplasty requiring isolated polyethylene liner exchange (HCC) 03/31/2020   Intermediate stage nonexudative age-related macular degeneration of right eye 12/23/2019   Exudative age-related macular degeneration of left eye with inactive choroidal neovascularization (HCC) 12/23/2019   Primary open angle glaucoma of both eyes, moderate stage 12/23/2019   Exudative retinopathy of left eye 12/23/2019   Routine general medical examination at a health care facility 12/11/2016   Primary osteoarthritis of left hip 05/01/2016   Autoimmune disease (HCC) 11/29/2015   High risk medication use  11/29/2015   Vitamin D deficiency 11/29/2015   Macular degeneration 11/29/2015   Bilateral lower extremity edema 10/26/2015   H/O total knee replacement, bilateral 12/09/2013   Breast mass, left 01/18/2011   Obesity 12/08/2009   Venous (peripheral) insufficiency 07/28/2009   PAIN IN JOINT, ANKLE AND FOOT 01/12/2008   Obstructive sleep apnea 08/29/2007   GAIT DISTURBANCE 08/14/2007   Depression 08/26/2006   Essential hypertension 08/26/2006   Primary osteoarthritis of both hands 08/26/2006   Urinary incontinence 08/26/2006   Past Medical History:  Diagnosis Date   Anxiety    Blood transfusion without reported diagnosis    Breast mass, left    Cataract    Depression    Difficult intubation 04/01/2020   Known difficult airway from previous records   Gait disturbance    GERD (gastroesophageal reflux disease)    Hypertension    Lymphedema of both lower extremities    Seeing OT at [facility] - dreeeing to Left Lower leg   Obesity    Rheumatoid arthritis(714.0)    Sleep apnea    cpap   Urinary incontinence    Venous insufficiency     Family History  Problem Relation Age of Onset   Coronary artery disease Father    Skin cancer Father    Heart disease Father    Asthma Brother    Heart disease Brother    Coronary artery disease Brother    Diabetes Other    Stroke Other    Colon  cancer Neg Hx    Esophageal cancer Neg Hx    Rectal cancer Neg Hx    Stomach cancer Neg Hx     Past Surgical History:  Procedure Laterality Date   ABDOMINAL HYSTERECTOMY     with vesicovaginal fistula closure   ANTERIOR CERVICAL DECOMP/DISCECTOMY FUSION N/A 06/11/2022   Procedure: ANTERIOR CERVICAL DISCECTOMY FUSION C3-4, C5-6 WITH ALLOGRAFT, PLATE;  Surgeon: Eldred Manges, MD;  Location: [facility] OR;  Service: Orthopedics;  Laterality: N/A;   ANTERIOR HIP REVISION Left 04/01/2020   Procedure: ANTERIOR LEFT HIP REVISION OF ACETABULAR COMPONENT;  Surgeon: Kathryne Hitch, MD;   Location: [facility];  Service: Orthopedics;  Laterality: Left;  3E   BLADDER SUSPENSION  2009   sling   BREAST REDUCTION SURGERY     COLONOSCOPY     ESOPHAGOGASTRODUODENOSCOPY     EYE SURGERY     REFRACTIVE SURGERY Right 06/26/2021   REPLACEMENT TOTAL KNEE Left    TOE SURGERY Left    Left great toe   toenail removal Right    all 5 toenails   TOTAL HIP ARTHROPLASTY     TOTAL KNEE ARTHROPLASTY Right 12/09/2013   Procedure: Right Total Knee Arthroplasty;  Surgeon: Nadara Mustard, MD;  Location: [facility] OR;  Service: Orthopedics;  Laterality: Right;   TOTAL KNEE REVISION Left 11/25/2020   Procedure: LEFT TOTAL KNEE REVISION;  Surgeon: Kathryne Hitch, MD;  Location: [facility];  Service: Orthopedics;  Laterality: Left;   Social History   Occupational History   Not on file  Tobacco Use   Smoking status: Former    Packs/day: 0.30    Years: 10.00    Additional pack years: 0.00    Total pack years: 3.00    Types: Cigarettes    Quit date: 02/05/1978    Years since quitting: 44.3    Passive exposure: Never   Smokeless tobacco: Never   Tobacco comments:    over 25 years ago...pt doesnt remember when she quit.   Vaping Use   Vaping Use: Never used  Substance and Sexual Activity   Alcohol use: No   Drug use: No   Sexual activity: Not on file

## 2022-06-22 ENCOUNTER — Other Ambulatory Visit: Payer: Self-pay | Admitting: Physician Assistant

## 2022-06-22 DIAGNOSIS — M359 Systemic involvement of connective tissue, unspecified: Secondary | ICD-10-CM

## 2022-06-22 NOTE — Telephone Encounter (Signed)
Last Fill: 12/04/2021  Eye exam: 12/11/2021 WNL    Labs: 05/08/2022 CBC and CMP WNL  Next Visit: 08/15/2022  Last Visit: 05/08/2022  DX: Autoimmune disease   Current Dose per office note 05/08/2022: Hydroxychloroquine 200 mg p.o. twice daily.   Okay to refill Plaquenil?

## 2022-06-27 ENCOUNTER — Telehealth: Payer: Self-pay | Admitting: Adult Health

## 2022-06-27 ENCOUNTER — Other Ambulatory Visit: Payer: Self-pay | Admitting: Adult Health

## 2022-06-27 MED ORDER — POTASSIUM CHLORIDE 20 MEQ/15ML (10%) PO SOLN: 20.0000 meq | Freq: Two times a day (BID) | ORAL | 1 refills | Status: DC

## 2022-06-27 NOTE — Telephone Encounter (Signed)
Noted  

## 2022-06-27 NOTE — Telephone Encounter (Signed)
Please advise 

## 2022-06-27 NOTE — Telephone Encounter (Signed)
Derwood Kaplan, RN with [employer] (Working with insurance) -   *Call if more info is needed [phone] Ext [phone] *Leaving at 1 pm, back after 6 pm   On 06/11/22, Pt had neck surgery   Pt has been unable to swallow her Potassium pills because they are so large. Is NP able to prescribe this in liquid form ?? Also, Pt has not been taking her Lasix either   Please call Patient to follow up.

## 2022-06-28 NOTE — Telephone Encounter (Signed)
Pt notified of update 

## 2022-06-28 NOTE — Telephone Encounter (Signed)
Left message to return phone call to inform of update.

## 2022-06-28 NOTE — Telephone Encounter (Signed)
Taylor Mccall notified of message below

## 2022-06-28 NOTE — Telephone Encounter (Signed)
Pt notified to take this BID and verbalized understanding

## 2022-07-24 ENCOUNTER — Other Ambulatory Visit (INDEPENDENT_AMBULATORY_CARE_PROVIDER_SITE_OTHER): Payer: Medicare HMO

## 2022-07-24 ENCOUNTER — Encounter: Payer: Self-pay | Admitting: Orthopaedic Surgery

## 2022-07-24 ENCOUNTER — Ambulatory Visit (INDEPENDENT_AMBULATORY_CARE_PROVIDER_SITE_OTHER): Payer: Medicare HMO | Admitting: Orthopaedic Surgery

## 2022-07-24 VITALS — BP 131/75 | HR 72 | Ht 70.0 in | Wt 262.0 lb

## 2022-07-24 DIAGNOSIS — Z981 Arthrodesis status: Secondary | ICD-10-CM

## 2022-07-24 NOTE — Progress Notes (Signed)
Post-Op Visit Note   Patient: Taylor Mccall           Date of Birth: Aug 18, 1945           MRN: [id number] Visit Date: 07/24/2022 PCP: Shirline Frees, NP   Assessment & Plan: Follow-up two-level cervical fusion C3-4 and C5-6 since she had autofused C4-5 in between.  She has a 3 level plate applied but only had a two-level fusion.  Flexion-extension shows no motion.  She can discontinue the collar.  She still walking with a rollator but and now she has been walking without it.  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  I will check her in a month.  Discontinue collar.  Chief Complaint:  Chief Complaint  Patient presents with   Neck - Routine Post Op, Follow-up    06/11/2022 C3-4, C5-6 ACDF   Visit Diagnoses:  1. Status post cervical spinal fusion     Plan: Progress with strengthening and increasing ambulation distance.  Return 1 month.  No x-ray needed on return.  Follow-Up Instructions: No follow-ups on file.   Orders:  Orders Placed This Encounter  Procedures   XR Cervical Spine 2 or 3 views   No orders of the defined types were placed in this encounter.   Imaging: No results found.  PMFS History: Patient Active Problem List   Diagnosis Date Noted   S/P cervical spinal fusion 06/11/2022   Spinal stenosis of cervical region 05/02/2022   Status post revision of total knee replacement, left 11/25/2020   Failed total knee, left, subsequent encounter 11/24/2020   Status post revision of total hip 04/01/2020   Polyethylene liner wear following total hip arthroplasty requiring isolated polyethylene liner exchange (HCC) 03/31/2020   Intermediate stage nonexudative age-related macular degeneration of right eye 12/23/2019   Exudative age-related macular degeneration of left eye with inactive  choroidal neovascularization (HCC) 12/23/2019   Primary open angle glaucoma of both eyes, moderate stage 12/23/2019   Exudative retinopathy of left eye 12/23/2019   Routine general medical examination at a health care facility 12/11/2016   Primary osteoarthritis of left hip 05/01/2016   Autoimmune disease (HCC) 11/29/2015   High risk medication use 11/29/2015   Vitamin D deficiency 11/29/2015   Macular degeneration 11/29/2015   Bilateral lower extremity edema 10/26/2015   H/O total knee replacement, bilateral 12/09/2013   Breast mass, left 01/18/2011   Obesity 12/08/2009   Venous (peripheral) insufficiency 07/28/2009   PAIN IN JOINT, ANKLE AND FOOT 01/12/2008   Obstructive sleep apnea 08/29/2007   GAIT DISTURBANCE 08/14/2007   Depression 08/26/2006   Essential hypertension 08/26/2006   Primary osteoarthritis of both hands 08/26/2006   Urinary incontinence 08/26/2006   Past Medical History:  Diagnosis Date   Anxiety    Blood transfusion without reported diagnosis    Breast mass, left    Cataract    Depression    Difficult intubation 04/01/2020   Known difficult airway from previous records   Gait disturbance    GERD (gastroesophageal reflux disease)    Hypertension    Lymphedema of both lower extremities    Seeing OT at [facility] - dreeeing to Left Lower leg   Obesity    Rheumatoid arthritis(714.0)    Sleep apnea    cpap  Urinary incontinence    Venous insufficiency     Family History  Problem Relation Age of Onset   Coronary artery disease Father    Skin cancer Father    Heart disease Father    Asthma Brother    Heart disease Brother    Coronary artery disease Brother    Diabetes Other    Stroke Other    Colon cancer Neg Hx    Esophageal cancer Neg Hx    Rectal cancer Neg Hx    Stomach cancer Neg Hx     Past Surgical History:  Procedure Laterality Date   ABDOMINAL HYSTERECTOMY     with vesicovaginal fistula closure   ANTERIOR CERVICAL  DECOMP/DISCECTOMY FUSION N/A 06/11/2022   Procedure: ANTERIOR CERVICAL DISCECTOMY FUSION C3-4, C5-6 WITH ALLOGRAFT, PLATE;  Surgeon: Eldred Manges, MD;  Location: [facility] OR;  Service: Orthopedics;  Laterality: N/A;   ANTERIOR HIP REVISION Left 04/01/2020   Procedure: ANTERIOR LEFT HIP REVISION OF ACETABULAR COMPONENT;  Surgeon: Kathryne Hitch, MD;  Location: [facility];  Service: Orthopedics;  Laterality: Left;  3E   BLADDER SUSPENSION  2009   sling   BREAST REDUCTION SURGERY     COLONOSCOPY     ESOPHAGOGASTRODUODENOSCOPY     EYE SURGERY     REFRACTIVE SURGERY Right 06/26/2021   REPLACEMENT TOTAL KNEE Left    TOE SURGERY Left    Left great toe   toenail removal Right    all 5 toenails   TOTAL HIP ARTHROPLASTY     TOTAL KNEE ARTHROPLASTY Right 12/09/2013   Procedure: Right Total Knee Arthroplasty;  Surgeon: Nadara Mustard, MD;  Location: [facility] OR;  Service: Orthopedics;  Laterality: Right;   TOTAL KNEE REVISION Left 11/25/2020   Procedure: LEFT TOTAL KNEE REVISION;  Surgeon: Kathryne Hitch, MD;  Location: [facility];  Service: Orthopedics;  Laterality: Left;   Social History   Occupational History   Not on file  Tobacco Use   Smoking status: Former    Packs/day: 0.30    Years: 10.00    Additional pack years: 0.00    Total pack years: 3.00    Types: Cigarettes    Quit date: 02/05/1978    Years since quitting: 44.4    Passive exposure: Never   Smokeless tobacco: Never   Tobacco comments:    over 25 years ago...pt doesnt remember when she quit.   Vaping Use   Vaping Use: Never used  Substance and Sexual Activity   Alcohol use: No   Drug use: No   Sexual activity: Not on file

## 2022-08-02 NOTE — Progress Notes (Unsigned)
Office Visit Note  Patient: Taylor Mccall             Date of Birth: Aug 05, 1945           MRN: [id number]             PCP: Shirline Frees, NP Referring: Shirline Frees, NP Visit Date: 08/15/2022 Occupation: @GUAROCC @  Subjective:  Medication monitoring   History of Present Illness: Taylor Mccall is a 77 y.o. female with history of autoimmune disease.  Patient is taking plaquenil 200 mg 1 tablet by mouth twice daily. She is tolerating plaquenil without any side effects.  Patient was last seen in the office on 05/08/2022 at which time there was discussion of switching from Plaquenil to low-dose leflunomide.  According to the patient her ophthalmologist has been concerned about her underlying macular degeneration making it difficult to interpret future field of vision testing for Plaquenil toxicity screening.  Patient has been apprehensive to discontinue Plaquenil since it has been beneficial at managing her symptoms.  She was apprehensive to initiate Arava due to possible side effects.  She held off on any medication changes at her last office visit due to undergoing a cervical spine fusion on 06/11/2022 performed by Dr. Ophelia Charter.  According to the patient she has had some delayed healing and has been encouraged to wear her C-spine collar for another 1 month.  She states that her neck pain has improved significantly since undergoing surgery.  Patient states that she remains apprehensive to initiate leflunomide.  She would like to remain on Plaquenil as prescribed with close monitoring.   She denies any signs or symptoms of a flare recently.   She denies any recent or recurrent infections.  She denies any new medical conditions.     Activities of Daily Living:  Patient reports morning stiffness for several hours.   Patient Reports nocturnal pain.  Difficulty dressing/grooming: Denies Difficulty climbing stairs: Denies Difficulty getting out of chair: Reports Difficulty using hands for  taps, buttons, cutlery, and/or writing: Reports  Review of Systems  Constitutional:  Positive for fatigue.  HENT:  Positive for mouth dryness. Negative for mouth sores.   Eyes:  Positive for dryness.  Respiratory:  Negative for shortness of breath.   Cardiovascular:  Negative for chest pain and palpitations.  Gastrointestinal:  Negative for blood in stool, constipation and diarrhea.  Endocrine: Negative for increased urination.  Genitourinary:  Negative for involuntary urination.  Musculoskeletal:  Positive for joint pain, gait problem, joint pain, joint swelling, myalgias, morning stiffness, muscle tenderness and myalgias. Negative for muscle weakness.  Skin:  Positive for hair loss. Negative for color change, rash and sensitivity to sunlight.  Allergic/Immunologic: Negative for susceptible to infections.  Neurological:  Negative for dizziness and headaches.  Hematological:  Negative for swollen glands.  Psychiatric/Behavioral:  Positive for sleep disturbance. Negative for depressed mood. The patient is not nervous/anxious.     PMFS History:  Patient Active Problem List   Diagnosis Date Noted   S/P cervical spinal fusion 06/11/2022   Spinal stenosis of cervical region 05/02/2022   Status post revision of total knee replacement, left 11/25/2020   Failed total knee, left, subsequent encounter 11/24/2020   Status post revision of total hip 04/01/2020   Polyethylene liner wear following total hip arthroplasty requiring isolated polyethylene liner exchange (HCC) 03/31/2020   Intermediate stage nonexudative age-related macular degeneration of right eye 12/23/2019   Exudative age-related macular degeneration of left eye with inactive choroidal neovascularization (HCC) 12/23/2019  Primary open angle glaucoma of both eyes, moderate stage 12/23/2019   Exudative retinopathy of left eye 12/23/2019   Routine general medical examination at a health care facility 12/11/2016   Primary  osteoarthritis of left hip 05/01/2016   Autoimmune disease (HCC) 11/29/2015   High risk medication use 11/29/2015   Vitamin D deficiency 11/29/2015   Macular degeneration 11/29/2015   Bilateral lower extremity edema 10/26/2015   H/O total knee replacement, bilateral 12/09/2013   Breast mass, left 01/18/2011   Obesity 12/08/2009   Venous (peripheral) insufficiency 07/28/2009   PAIN IN JOINT, ANKLE AND FOOT 01/12/2008   Obstructive sleep apnea 08/29/2007   GAIT DISTURBANCE 08/14/2007   Depression 08/26/2006   Essential hypertension 08/26/2006   Primary osteoarthritis of both hands 08/26/2006   Urinary incontinence 08/26/2006    Past Medical History:  Diagnosis Date   Anxiety    Blood transfusion without reported diagnosis    Breast mass, left    Cataract    Depression    Difficult intubation 04/01/2020   Known difficult airway from previous records   Gait disturbance    GERD (gastroesophageal reflux disease)    Hypertension    Lymphedema of both lower extremities    Seeing OT at [facility] - dreeeing to Left Lower leg   Obesity    Rheumatoid arthritis(714.0)    Sleep apnea    cpap   Urinary incontinence    Venous insufficiency     Family History  Problem Relation Age of Onset   Coronary artery disease Father    Skin cancer Father    Heart disease Father    Asthma Brother    Heart disease Brother    Coronary artery disease Brother    Diabetes Other    Stroke Other    Colon cancer Neg Hx    Esophageal cancer Neg Hx    Rectal cancer Neg Hx    Stomach cancer Neg Hx    Past Surgical History:  Procedure Laterality Date   ABDOMINAL HYSTERECTOMY     with vesicovaginal fistula closure   ANTERIOR CERVICAL DECOMP/DISCECTOMY FUSION N/A 06/11/2022   Procedure: ANTERIOR CERVICAL DISCECTOMY FUSION C3-4, C5-6 WITH ALLOGRAFT, PLATE;  Surgeon: Eldred Manges, MD;  Location: [facility] OR;  Service: Orthopedics;  Laterality: N/A;   ANTERIOR HIP REVISION Left 04/01/2020    Procedure: ANTERIOR LEFT HIP REVISION OF ACETABULAR COMPONENT;  Surgeon: Kathryne Hitch, MD;  Location: [facility] ORS;  Service: Orthopedics;  Laterality: Left;  3E   BLADDER SUSPENSION  2009   sling   BREAST REDUCTION SURGERY     COLONOSCOPY     ESOPHAGOGASTRODUODENOSCOPY     EYE SURGERY     REFRACTIVE SURGERY Right 06/26/2021   REPLACEMENT TOTAL KNEE Left    TOE SURGERY Left    Left great toe   toenail removal Right    all 5 toenails   TOTAL HIP ARTHROPLASTY     TOTAL KNEE ARTHROPLASTY Right 12/09/2013   Procedure: Right Total Knee Arthroplasty;  Surgeon: Nadara Mustard, MD;  Location: [facility] OR;  Service: Orthopedics;  Laterality: Right;   TOTAL KNEE REVISION Left 11/25/2020   Procedure: LEFT TOTAL KNEE REVISION;  Surgeon: Kathryne Hitch, MD;  Location: [facility] ORS;  Service: Orthopedics;  Laterality: Left;   Social History   Social History Narrative   Not on file   Immunization History  Administered Date(s) Administered   Fluad Quad(high Dose 65+) 11/03/2021   Influenza Split 12/14/2010, 12/06/2012   Influenza  Whole 02/06/2004, 12/08/2009   Influenza, High Dose Seasonal PF 10/26/2015, 12/11/2016, 01/06/2018, 10/07/2018, 11/15/2020   Influenza,inj,Quad PF,6+ Mos 10/15/2013, 11/19/2014   Influenza-Unspecified 10/07/2018   PFIZER(Purple Top)SARS-COV-2 Vaccination 03/01/2019, 03/21/2019, 10/06/2019   Pfizer Covid-19 Vaccine Bivalent Booster 84yrs & up 11/15/2020   Pneumococcal Conjugate-13 03/18/2013   Pneumococcal Polysaccharide-23 06/30/2008, 10/26/2015   Td 02/05/2005   Tdap 12/14/2010, 11/03/2021     Objective: Vital Signs: BP 119/78 (BP Location: Left Arm, Patient Position: Sitting, Cuff Size: Large)   Pulse 62   Resp 14   Ht 5\' 10"  (1.778 m)   Wt 261 lb 9.6 oz (118.7 kg)   BMI 37.54 kg/m    Physical Exam Vitals and nursing note reviewed.  Constitutional:      Appearance: She is well-developed.  HENT:     Head: Normocephalic and atraumatic.  Eyes:      Conjunctiva/sclera: Conjunctivae normal.  Cardiovascular:     Rate and Rhythm: Normal rate and regular rhythm.     Heart sounds: Normal heart sounds.  Pulmonary:     Effort: Pulmonary effort is normal.     Breath sounds: Normal breath sounds.  Abdominal:     General: Bowel sounds are normal.     Palpations: Abdomen is soft.  Musculoskeletal:     Cervical back: Normal range of motion.  Lymphadenopathy:     Cervical: No cervical adenopathy.  Skin:    General: Skin is warm and dry.     Capillary Refill: Capillary refill takes less than 2 seconds.  Neurological:     Mental Status: She is alert and oriented to person, place, and time.  Psychiatric:        Behavior: Behavior normal.      Musculoskeletal Exam: Patient remained seated in her rollator walker during the examination today.  C-spine has good range of motion.  Painful limited abduction of both shoulders especially of the left shoulder to 90 degrees.  Right shoulder abduction to about 140 degrees.  No tenderness or inflammation along the elbow joint line.  PIP and DIP thickening noted in both hands with mild.  Hip joints difficult to assess in seated position.  Bilateral knee replacements have good range of motion.  Significant pedal edema noted in bilateral lower extremities.  CDAI Exam: CDAI Score: -- Patient Global: --; Provider Global: -- Swollen: --; Tender: -- Joint Exam 08/15/2022   No joint exam has been documented for this visit   There is currently no information documented on the homunculus. Go to the Rheumatology activity and complete the homunculus joint exam.  Investigation: No additional findings.  Imaging: XR Cervical Spine 2 or 3 views  Result Date: 07/24/2022 AP and lateral cervical spine flexion-extension x-rays obtained and reviewed this shows two-level cervical fusion C3-4 and C5-6 with previous autofusion solid at C4-5.  No motion is noted.  No loosening of screws.  Satisfactory position of graft and  plate. Impression: Post C3-4 and C5-6 fusion with plate and allograft satisfactory position alignment.   Recent Labs: Lab Results  Component Value Date   WBC 4.7 06/01/2022   HGB 11.7 (L) 06/01/2022   PLT 243 06/01/2022   NA 140 06/01/2022   K 4.0 06/01/2022   CL 106 06/01/2022   CO2 25 06/01/2022   GLUCOSE 85 06/01/2022   BUN 10 06/01/2022   CREATININE 0.81 06/01/2022   BILITOT 0.6 05/08/2022   ALKPHOS 110 09/14/2021   AST 13 05/08/2022   ALT 8 05/08/2022   PROT 7.0 05/08/2022  ALBUMIN 4.2 09/14/2021   CALCIUM 9.2 06/01/2022   GFRAA 98 07/26/2020    Speciality Comments: PLQ eye exam: 12/11/2021 WNL @ [facility]. MTX in the past-dcd due to OU, hair loss  Procedures:  No procedures performed Allergies: Penicillins    Assessment / Plan:     Visit Diagnoses: Autoimmune disease (HCC) - +ANA, +RNP, history of inflammatory arthritis: Patient has not had any signs or symptoms of an autoimmune disease flare.  She remains on Plaquenil 200 mg 1 tablet by mouth twice daily.  She is tolerating Plaquenil without any side effects.  Patient was last seen in the office on 05/08/2022 at which time there was discussion of switching from Plaquenil to low-dose Arava due to history of macular degeneration.  The patient was concerned about possible side effects of Arava and declined making any medication changes.  She continues to find Plaquenil to be effective at managing her symptoms and is apprehensive to discontinue.  Patient will continue to follow-up with her ophthalmologist closely. She has no synovitis on examination today.  She has not had any oral or nasal ulcerations.  No Raynaud's phenomenon.  She continues to have chronic sicca symptoms which have been unchanged.  She has not had any signs of alopecia or recent rashes.  Discussed the importance of wearing SPF greater than 50 on a daily basis and avoiding direct sun exposure. Lab work from 05/08/22: RNP >8, ESR WNL, complements WNL,  dsDNA is negative, protein creatinine ratio WNL. The following lab work will be updated today.  She will remain on plaquenil as prescribed.  She was advised to notify us if she develops any new or worsening symptoms.  She will follow up in 5 months or sooner if needed.  - Plan: CBC with Differential/Platelet, COMPLETE METABOLIC PANEL WITH GFR, Protein / creatinine ratio, urine, Anti-DNA antibody, double-stranded, C3 and C4, Sedimentation rate  High risk medication use - Plaquenil 200 mg 1 tablet by mouth twice daily.  CBC and CMP WNL on 05/08/22. CBC and CMP released today.   PLQ eye exam: 12/11/2021 WNL @ [facility].  Concern for debility field of vision testing in the future given history of macular degeneration.  Patient has declined making any medication changes at this time.  She does not want to switch to low-dose leflunomide currently.  Patient plans on continuing to follow-up with her ophthalmologist closely.  - Plan: CBC with Differential/Platelet, COMPLETE METABOLIC PANEL WITH GFR  Primary osteoarthritis of both hands: PIP and DIP thickening-mild.  No synovitis noted today.   Chronic left shoulder pain: Limited abduction to about 90 degrees.   Carpal tunnel syndrome, right upper limb: Not currently symptomatic.   S/P total left hip arthroplasty - Revision February 2022 by Dr. Magnus Ivan. Doing well.  Using rollator walker to assist with ambulation.   H/O total knee replacement, bilateral: Doing well.  Good ROM with no discomfort at this time.  Using rollator walker to assist with ambulation.   DDD (degenerative disc disease), cervical - Severe multilevel spinal stenosis>>s/p anterior cervical fusion-performed by Dr. Ophelia Charter on 06/11/22. Patient remains in C-spine collar x1 more month.   DDD (degenerative disc disease), lumbar: Chronic pain and limited mobility.   Other medical conditions are listed as follows:   Vitamin D deficiency  Lymphedema  History of macular  degeneration  History of glaucoma  History of depression  History of sleep apnea  Orders: Orders Placed This Encounter  Procedures   CBC with Differential/Platelet   COMPLETE METABOLIC  PANEL WITH GFR   Protein / creatinine ratio, urine   Anti-DNA antibody, double-stranded   C3 and C4   Sedimentation rate   No orders of the defined types were placed in this encounter.    Follow-Up Instructions: Return in about 5 months (around 01/15/2023) for Autoimmune Disease.   Gearldine Bienenstock, PA-C  Note - This record has been created using Dragon software.  Chart creation errors have been sought, but may not always  have been located. Such creation errors do not reflect on  the standard of medical care.

## 2022-08-14 ENCOUNTER — Ambulatory Visit (INDEPENDENT_AMBULATORY_CARE_PROVIDER_SITE_OTHER): Payer: Medicare HMO | Admitting: Orthopaedic Surgery

## 2022-08-14 ENCOUNTER — Other Ambulatory Visit: Payer: Self-pay

## 2022-08-14 DIAGNOSIS — Z981 Arthrodesis status: Secondary | ICD-10-CM

## 2022-08-14 NOTE — Progress Notes (Signed)
Post-Op Visit Note   Patient: Taylor Mccall           Date of Birth: 10/21/1945           MRN: [id number] Visit Date: 08/14/2022 PCP: Shirline Frees, NP   Assessment & Plan:  Chief Complaint:  Chief Complaint  Patient presents with   Neck - Follow-up    06/11/22 ACDF C3-4 C5-6 with allograft, plate   Visit Diagnoses:  1. Status post cervical spinal fusion     Plan: ***  Follow-Up Instructions: Return in about 1 month (around 09/14/2022).   Orders:  Orders Placed This Encounter  Procedures   XR Cervical Spine 2 or 3 views   No orders of the defined types were placed in this encounter.   Imaging: No results found.  PMFS History: Patient Active Problem List   Diagnosis Date Noted   S/P cervical spinal fusion 06/11/2022   Spinal stenosis of cervical region 05/02/2022   Status post revision of total knee replacement, left 11/25/2020   Failed total knee, left, subsequent encounter 11/24/2020   Status post revision of total hip 04/01/2020   Polyethylene liner wear following total hip arthroplasty requiring isolated polyethylene liner exchange (HCC) 03/31/2020   Intermediate stage nonexudative age-related macular degeneration of right eye 12/23/2019   Exudative age-related macular degeneration of left eye with inactive choroidal neovascularization (HCC) 12/23/2019   Primary open angle glaucoma of both eyes, moderate stage 12/23/2019   Exudative retinopathy of left eye 12/23/2019   Routine general medical examination at a health care facility 12/11/2016   Primary osteoarthritis of left hip 05/01/2016   Autoimmune disease (HCC) 11/29/2015   High risk medication use 11/29/2015   Vitamin D deficiency 11/29/2015   Macular degeneration 11/29/2015   Bilateral lower extremity edema 10/26/2015   H/O total knee replacement, bilateral 12/09/2013   Breast mass, left 01/18/2011   Obesity 12/08/2009   Venous (peripheral) insufficiency 07/28/2009   PAIN IN JOINT, ANKLE AND  FOOT 01/12/2008   Obstructive sleep apnea 08/29/2007   GAIT DISTURBANCE 08/14/2007   Depression 08/26/2006   Essential hypertension 08/26/2006   Primary osteoarthritis of both hands 08/26/2006   Urinary incontinence 08/26/2006   Past Medical History:  Diagnosis Date   Anxiety    Blood transfusion without reported diagnosis    Breast mass, left    Cataract    Depression    Difficult intubation 04/01/2020   Known difficult airway from previous records   Gait disturbance    GERD (gastroesophageal reflux disease)    Hypertension    Lymphedema of both lower extremities    Seeing OT at [facility] - dreeeing to Left Lower leg   Obesity    Rheumatoid arthritis(714.0)    Sleep apnea    cpap   Urinary incontinence    Venous insufficiency     Family History  Problem Relation Age of Onset   Coronary artery disease Father    Skin cancer Father    Heart disease Father    Asthma Brother    Heart disease Brother    Coronary artery disease Brother    Diabetes Other    Stroke Other    Colon cancer Neg Hx    Esophageal cancer Neg Hx    Rectal cancer Neg Hx    Stomach cancer Neg Hx     Past Surgical History:  Procedure Laterality Date   ABDOMINAL HYSTERECTOMY     with vesicovaginal fistula closure   ANTERIOR CERVICAL DECOMP/DISCECTOMY  FUSION N/A 06/11/2022   Procedure: ANTERIOR CERVICAL DISCECTOMY FUSION C3-4, C5-6 WITH ALLOGRAFT, PLATE;  Surgeon: Eldred Manges, MD;  Location: [facility] OR;  Service: Orthopedics;  Laterality: N/A;   ANTERIOR HIP REVISION Left 04/01/2020   Procedure: ANTERIOR LEFT HIP REVISION OF ACETABULAR COMPONENT;  Surgeon: Kathryne Hitch, MD;  Location: [facility];  Service: Orthopedics;  Laterality: Left;  3E   BLADDER SUSPENSION  2009   sling   BREAST REDUCTION SURGERY     COLONOSCOPY     ESOPHAGOGASTRODUODENOSCOPY     EYE SURGERY     REFRACTIVE SURGERY Right 06/26/2021   REPLACEMENT TOTAL KNEE Left    TOE SURGERY Left    Left great toe    toenail removal Right    all 5 toenails   TOTAL HIP ARTHROPLASTY     TOTAL KNEE ARTHROPLASTY Right 12/09/2013   Procedure: Right Total Knee Arthroplasty;  Surgeon: Nadara Mustard, MD;  Location: [facility];  Service: Orthopedics;  Laterality: Right;   TOTAL KNEE REVISION Left 11/25/2020   Procedure: LEFT TOTAL KNEE REVISION;  Surgeon: Kathryne Hitch, MD;  Location: [facility];  Service: Orthopedics;  Laterality: Left;   Social History   Occupational History   Not on file  Tobacco Use   Smoking status: Former    Packs/day: 0.30    Years: 10.00    Additional pack years: 0.00    Total pack years: 3.00    Types: Cigarettes    Quit date: 02/05/1978    Years since quitting: 44.5    Passive exposure: Never   Smokeless tobacco: Never   Tobacco comments:    over 25 years ago...pt doesnt remember when she quit.   Vaping Use   Vaping Use: Never used  Substance and Sexual Activity   Alcohol use: No   Drug use: No   Sexual activity: Not on file

## 2022-08-15 ENCOUNTER — Ambulatory Visit: Payer: Medicare HMO | Attending: Physician Assistant | Admitting: Physician Assistant

## 2022-08-15 ENCOUNTER — Encounter: Payer: Self-pay | Admitting: Physician Assistant

## 2022-08-15 VITALS — BP 119/78 | HR 62 | Resp 14 | Ht 70.0 in | Wt 261.6 lb

## 2022-08-15 DIAGNOSIS — M5136 Other intervertebral disc degeneration, lumbar region: Secondary | ICD-10-CM

## 2022-08-15 DIAGNOSIS — I890 Lymphedema, not elsewhere classified: Secondary | ICD-10-CM

## 2022-08-15 DIAGNOSIS — M25512 Pain in left shoulder: Secondary | ICD-10-CM

## 2022-08-15 DIAGNOSIS — M19041 Primary osteoarthritis, right hand: Secondary | ICD-10-CM | POA: Diagnosis not present

## 2022-08-15 DIAGNOSIS — M5030 Other cervical disc degeneration, unspecified cervical region: Secondary | ICD-10-CM

## 2022-08-15 DIAGNOSIS — Z8659 Personal history of other mental and behavioral disorders: Secondary | ICD-10-CM

## 2022-08-15 DIAGNOSIS — Z79899 Other long term (current) drug therapy: Secondary | ICD-10-CM

## 2022-08-15 DIAGNOSIS — M359 Systemic involvement of connective tissue, unspecified: Secondary | ICD-10-CM | POA: Diagnosis not present

## 2022-08-15 DIAGNOSIS — M19042 Primary osteoarthritis, left hand: Secondary | ICD-10-CM

## 2022-08-15 DIAGNOSIS — Z8669 Personal history of other diseases of the nervous system and sense organs: Secondary | ICD-10-CM

## 2022-08-15 DIAGNOSIS — Z96653 Presence of artificial knee joint, bilateral: Secondary | ICD-10-CM

## 2022-08-15 DIAGNOSIS — G5601 Carpal tunnel syndrome, right upper limb: Secondary | ICD-10-CM

## 2022-08-15 DIAGNOSIS — E559 Vitamin D deficiency, unspecified: Secondary | ICD-10-CM

## 2022-08-15 DIAGNOSIS — G8929 Other chronic pain: Secondary | ICD-10-CM

## 2022-08-15 DIAGNOSIS — Z96642 Presence of left artificial hip joint: Secondary | ICD-10-CM

## 2022-08-16 LAB — CBC WITH DIFFERENTIAL/PLATELET
Absolute Monocytes: 343 cells/uL (ref 200–950)
Basophils Absolute: 39 cells/uL (ref 0–200)
Basophils Relative: 1 %
Eosinophils Absolute: 261 cells/uL (ref 15–500)
Eosinophils Relative: 6.7 %
HCT: 33.6 % — ABNORMAL LOW (ref 35.0–45.0)
Hemoglobin: 11 g/dL — ABNORMAL LOW (ref 11.7–15.5)
Lymphs Abs: 1260 cells/uL (ref 850–3900)
MCH: 28.6 pg (ref 27.0–33.0)
MCHC: 32.7 g/dL (ref 32.0–36.0)
MCV: 87.5 fL (ref 80.0–100.0)
MPV: 11.2 fL (ref 7.5–12.5)
Monocytes Relative: 8.8 %
Neutro Abs: 1997 cells/uL (ref 1500–7800)
Neutrophils Relative %: 51.2 %
Platelets: 221 10*3/uL (ref 140–400)
RBC: 3.84 10*6/uL (ref 3.80–5.10)
RDW: 11.7 % (ref 11.0–15.0)
Total Lymphocyte: 32.3 %
WBC: 3.9 10*3/uL (ref 3.8–10.8)

## 2022-08-16 LAB — ANTI-DNA ANTIBODY, DOUBLE-STRANDED: ds DNA Ab: <1 IU/mL

## 2022-08-16 LAB — PROTEIN / CREATININE RATIO, URINE
Creatinine, Urine: 188 mg/dL (ref 20–275)
Protein/Creat Ratio: 69 mg/g creat (ref 24–184)
Protein/Creatinine Ratio: 0.069 mg/mg creat (ref 0.024–0.184)
Total Protein, Urine: 13 mg/dL (ref 5–24)

## 2022-08-16 LAB — COMPLETE METABOLIC PANEL WITH GFR
AG Ratio: 1.5 (calc) (ref 1.0–2.5)
ALT: 7 U/L (ref 6–29)
AST: 12 U/L (ref 10–35)
Albumin: 4 g/dL (ref 3.6–5.1)
Alkaline phosphatase (APISO): 89 U/L (ref 37–153)
BUN: 11 mg/dL (ref 7–25)
CO2: 29 mmol/L (ref 20–32)
Calcium: 9.9 mg/dL (ref 8.6–10.4)
Chloride: 106 mmol/L (ref 98–110)
Creat: 0.79 mg/dL (ref 0.60–1.00)
Globulin: 2.7 g/dL (calc) (ref 1.9–3.7)
Glucose, Bld: 79 mg/dL (ref 65–99)
Potassium: 3.7 mmol/L (ref 3.5–5.3)
Sodium: 142 mmol/L (ref 135–146)
Total Bilirubin: 0.8 mg/dL (ref 0.2–1.2)
Total Protein: 6.7 g/dL (ref 6.1–8.1)
eGFR: 77 mL/min/{1.73_m2} (ref >=60)

## 2022-08-16 LAB — C3 AND C4
C3 Complement: 115 mg/dL (ref 83–193)
C4 Complement: 25 mg/dL (ref 15–57)

## 2022-08-16 LAB — SEDIMENTATION RATE: Sed Rate: 2 mm/h (ref 0–30)

## 2022-08-17 NOTE — Progress Notes (Signed)
Hemoglobin and hematocrit remain slightly low. Rest of CBC WNL.  Please clarify if she has noticed any blood in her stool or any other source of bleeding?  CMP WNL Protein creatinine ratio WNL  ESR WNL  Complements WNL dsDNA negative.  No change in therapy recommended at this time--labs are not consistent with a flare.

## 2022-08-21 ENCOUNTER — Encounter: Payer: Medicare HMO | Admitting: Orthopaedic Surgery

## 2022-08-21 ENCOUNTER — Other Ambulatory Visit: Payer: Self-pay | Admitting: Adult Health

## 2022-08-21 DIAGNOSIS — I10 Essential (primary) hypertension: Secondary | ICD-10-CM

## 2022-08-21 DIAGNOSIS — Z0000 Encounter for general adult medical examination without abnormal findings: Secondary | ICD-10-CM

## 2022-08-21 NOTE — Telephone Encounter (Signed)
KLOR-CON M20 20 MEQ tablet    The original prescription was discontinued on 06/27/2022 by Shirline Frees, NP. Renewing this prescription may not be appropriate.   Sig: TAKE 2 TABLETS (40 MEQ TOTAL) BY MOUTH EVERY MORNING.   Disp: 180 tablet    Refills: 0 (Pharmacy requested: Not specified)

## 2022-09-01 ENCOUNTER — Other Ambulatory Visit: Payer: Self-pay | Admitting: Adult Health

## 2022-09-01 DIAGNOSIS — Z0000 Encounter for general adult medical examination without abnormal findings: Secondary | ICD-10-CM

## 2022-09-01 DIAGNOSIS — K219 Gastro-esophageal reflux disease without esophagitis: Secondary | ICD-10-CM

## 2022-09-07 ENCOUNTER — Other Ambulatory Visit: Payer: Self-pay | Admitting: Adult Health

## 2022-09-07 DIAGNOSIS — Z0000 Encounter for general adult medical examination without abnormal findings: Secondary | ICD-10-CM

## 2022-09-07 DIAGNOSIS — I10 Essential (primary) hypertension: Secondary | ICD-10-CM

## 2022-09-17 ENCOUNTER — Other Ambulatory Visit: Payer: Self-pay | Admitting: Adult Health

## 2022-09-17 DIAGNOSIS — Z0000 Encounter for general adult medical examination without abnormal findings: Secondary | ICD-10-CM

## 2022-09-18 ENCOUNTER — Other Ambulatory Visit: Payer: Self-pay | Admitting: Physician Assistant

## 2022-09-18 DIAGNOSIS — M359 Systemic involvement of connective tissue, unspecified: Secondary | ICD-10-CM

## 2022-09-18 NOTE — Telephone Encounter (Signed)
Last Fill: 06/22/2022  Eye exam: 12/11/2021 WNL    Labs: 08/15/2022 Hemoglobin and hematocrit remain slightly low. Rest of CBC WNL CMP WNL   Next Visit: 01/15/2023  Last Visit: 08/15/2022  DX: Autoimmune disease   Current Dose per office note 08/15/2022: Plaquenil 200 mg 1 tablet by mouth twice daily   Okay to refill Plaquenil?

## 2022-09-26 ENCOUNTER — Ambulatory Visit: Payer: Medicare HMO | Admitting: Physician Assistant

## 2022-09-26 ENCOUNTER — Encounter: Payer: Self-pay | Admitting: Physician Assistant

## 2022-09-26 DIAGNOSIS — M542 Cervicalgia: Secondary | ICD-10-CM

## 2022-09-26 NOTE — Progress Notes (Signed)
Office Visit Note   Patient: Taylor Mccall           Date of Birth: [birth year]           MRN: [id number] Visit Date: 09/26/2022              Requested by: Shirline Frees, NP [address] [place],  [place] [address] PCP: Shirline Frees, NP  No chief complaint on file.     HPI: Pleasant 77 year old woman who is 3 and half months status post cervical anterior discectomy and fusion with Dr. Ophelia Charter at C3-4 and C5-6.  Has been having difficulty with healing and pain more related to the C5-6 level.  Has been states her pain is moderate and has not improved since her last visit with Dr. Ophelia Charter  Assessment & Plan: Visit Diagnoses: Cervical neck pain  Plan: Patient is a pleasant 77 year old woman who is a patient of Dr. Ophelia Charter.  She is status post anterior cervical discectomy fusion 3-1/2 months ago.  C3-4 and C5-6.  She last saw Dr. Ophelia Charter last month.  At that time there were concerns whether there was delayed healing at the C5-6 fusion.  She was placed in a soft collar which she has been wearing consistently and wears today.  She feels like her symptoms have not gotten any better.  Has some paresthesias in her fingers and hands but the pain in her neck is more focal.  Reviewed Dr. Ophelia Charter notes he had recommended an MRI if she did not improve.  Will order this today she understands to follow-up with Dr. Ophelia Charter and she already has an appointment for him for September.  In the meantime should remain immobilized in cervical collar  Follow-Up Instructions: With Dr. Ophelia Charter September 3  Ortho Exam  Patient is alert, oriented, no adenopathy, well-dressed, normal affect, normal respiratory effort. Well-healed surgical incision she is wearing her soft collar.  No redness no fluctuance no erythema.  She has good grip grip strength good biceps and tricep strength.  She is focally tender over the C-spine just to the right of midline  Imaging: No results found. No images are attached to the  encounter.  Labs: Lab Results  Component Value Date   HGBA1C 5.2 09/13/2020   HGBA1C 5.0 08/15/2016   ESRSEDRATE 2 08/15/2022   ESRSEDRATE 17 05/08/2022   ESRSEDRATE 6 12/04/2021   LABURIC 4.2 01/19/2008   REPTSTATUS 07/26/2019 FINAL 07/25/2019   CULT (A) 07/25/2019    <10,000 COLONIES/mL INSIGNIFICANT GROWTH Performed at [facility], [address], [place], [place] [address]    [facility] ESCHERICHIA COLI 07/28/2014     Lab Results  Component Value Date   ALBUMIN 4.2 09/14/2021   ALBUMIN 4.1 09/13/2020   ALBUMIN 3.7 07/25/2019    No results found for: "MG" Lab Results  Component Value Date   VD25OH 32 02/03/2021   VD25OH 44 06/15/2019   VD25OH 25 (L) 03/18/2019    No results found for: "PREALBUMIN"    Latest Ref Rng & Units 08/15/2022    2:07 PM 06/01/2022    3:05 PM 05/08/2022    1:48 PM  CBC EXTENDED  WBC 3.8 - 10.8 Thousand/uL 3.9  4.7  5.3   RBC 3.80 - 5.10 Million/uL 3.84  4.01  4.07   Hemoglobin 11.7 - 15.5 g/dL 52.8  41.3  24.4   HCT 35.0 - 45.0 % 33.6  36.8  35.8   Platelets 140 - 400 Thousand/uL 221  243  314  NEUT# 1,500 - 7,800 cells/uL 1,997   3,175   Lymph# 850 - 3,900 cells/uL 1,260   1,197.8      There is no height or weight on file to calculate BMI.  Orders:  No orders of the defined types were placed in this encounter.  No orders of the defined types were placed in this encounter.    Procedures: No procedures performed  Clinical Data: No additional findings.  ROS:  All other systems negative, except as noted in the HPI. Review of Systems  Objective: Vital Signs: There were no vitals taken for this visit.  Specialty Comments:  No specialty comments available.  PMFS History: Patient Active Problem List   Diagnosis Date Noted   S/P cervical spinal fusion 06/11/2022   Spinal stenosis of cervical region 05/02/2022   Status post revision of total knee replacement, left 11/25/2020   Failed total knee, left, subsequent  encounter 11/24/2020   Status post revision of total hip 04/01/2020   Polyethylene liner wear following total hip arthroplasty requiring isolated polyethylene liner exchange (HCC) 03/31/2020   Intermediate stage nonexudative age-related macular degeneration of right eye 12/23/2019   Exudative age-related macular degeneration of left eye with inactive choroidal neovascularization (HCC) 12/23/2019   Primary open angle glaucoma of both eyes, moderate stage 12/23/2019   Exudative retinopathy of left eye 12/23/2019   Routine general medical examination at a health care facility 12/11/2016   Primary osteoarthritis of left hip 05/01/2016   Autoimmune disease (HCC) 11/29/2015   High risk medication use 11/29/2015   Vitamin D deficiency 11/29/2015   Macular degeneration 11/29/2015   Bilateral lower extremity edema 10/26/2015   H/O total knee replacement, bilateral 12/09/2013   Breast mass, left 01/18/2011   Obesity 12/08/2009   Venous (peripheral) insufficiency 07/28/2009   PAIN IN JOINT, ANKLE AND FOOT 01/12/2008   Obstructive sleep apnea 08/29/2007   GAIT DISTURBANCE 08/14/2007   Depression 08/26/2006   Essential hypertension 08/26/2006   Primary osteoarthritis of both hands 08/26/2006   Urinary incontinence 08/26/2006   Past Medical History:  Diagnosis Date   Anxiety    Blood transfusion without reported diagnosis    Breast mass, left    Cataract    Depression    Difficult intubation 04/01/2020   Known difficult airway from previous records   Gait disturbance    GERD (gastroesophageal reflux disease)    Hypertension    Lymphedema of both lower extremities    Seeing OT at [facility] - dreeeing to Left Lower leg   Obesity    Rheumatoid arthritis(714.0)    Sleep apnea    cpap   Urinary incontinence    Venous insufficiency     Family History  Problem Relation Age of Onset   Coronary artery disease Father    Skin cancer Father    Heart disease Father    Asthma  Brother    Heart disease Brother    Coronary artery disease Brother    Diabetes Other    Stroke Other    Colon cancer Neg Hx    Esophageal cancer Neg Hx    Rectal cancer Neg Hx    Stomach cancer Neg Hx     Past Surgical History:  Procedure Laterality Date   ABDOMINAL HYSTERECTOMY     with vesicovaginal fistula closure   ANTERIOR CERVICAL DECOMP/DISCECTOMY FUSION N/A 06/11/2022   Procedure: ANTERIOR CERVICAL DISCECTOMY FUSION C3-4, C5-6 WITH ALLOGRAFT, PLATE;  Surgeon: Eldred Manges, MD;  Location: [facility] OR;  Service: Orthopedics;  Laterality: N/A;   ANTERIOR HIP REVISION Left 04/01/2020   Procedure: ANTERIOR LEFT HIP REVISION OF ACETABULAR COMPONENT;  Surgeon: Kathryne Hitch, MD;  Location: [facility] ORS;  Service: Orthopedics;  Laterality: Left;  3E   BLADDER SUSPENSION  2009   sling   BREAST REDUCTION SURGERY     COLONOSCOPY     ESOPHAGOGASTRODUODENOSCOPY     EYE SURGERY     REFRACTIVE SURGERY Right 06/26/2021   REPLACEMENT TOTAL KNEE Left    TOE SURGERY Left    Left great toe   toenail removal Right    all 5 toenails   TOTAL HIP ARTHROPLASTY     TOTAL KNEE ARTHROPLASTY Right 12/09/2013   Procedure: Right Total Knee Arthroplasty;  Surgeon: Nadara Mustard, MD;  Location: [facility] OR;  Service: Orthopedics;  Laterality: Right;   TOTAL KNEE REVISION Left 11/25/2020   Procedure: LEFT TOTAL KNEE REVISION;  Surgeon: Kathryne Hitch, MD;  Location: [facility] ORS;  Service: Orthopedics;  Laterality: Left;   Social History   Occupational History   Not on file  Tobacco Use   Smoking status: Former    Current packs/day: 0.00    Average packs/day: 0.3 packs/day for 10.0 years (3.0 ttl pk-yrs)    Types: Cigarettes    Start date: 02/06/1968    Quit date: 02/05/1978    Years since quitting: 44.6    Passive exposure: Never   Smokeless tobacco: Never   Tobacco comments:    over 25 years ago...pt doesnt remember when she quit.   Vaping Use   Vaping status: Never Used  Substance and Sexual  Activity   Alcohol use: No   Drug use: No   Sexual activity: Not on file

## 2022-10-04 ENCOUNTER — Other Ambulatory Visit: Payer: Self-pay | Admitting: Adult Health

## 2022-10-04 DIAGNOSIS — I10 Essential (primary) hypertension: Secondary | ICD-10-CM

## 2022-10-04 DIAGNOSIS — F419 Anxiety disorder, unspecified: Secondary | ICD-10-CM

## 2022-10-04 DIAGNOSIS — Z0000 Encounter for general adult medical examination without abnormal findings: Secondary | ICD-10-CM

## 2022-10-05 NOTE — Telephone Encounter (Signed)
Okay for refill?  

## 2022-10-09 ENCOUNTER — Encounter: Payer: Medicare HMO | Admitting: Orthopaedic Surgery

## 2022-10-27 ENCOUNTER — Ambulatory Visit
Admission: RE | Admit: 2022-10-27 | Discharge: 2022-10-27 | Disposition: A | Discharge status: Home / Self Care | Payer: Medicare HMO | Source: Ambulatory Visit | Attending: Physician Assistant

## 2022-10-27 DIAGNOSIS — M542 Cervicalgia: Secondary | ICD-10-CM

## 2022-11-02 ENCOUNTER — Ambulatory Visit (INDEPENDENT_AMBULATORY_CARE_PROVIDER_SITE_OTHER): Payer: Medicare HMO | Admitting: Orthopaedic Surgery

## 2022-11-02 ENCOUNTER — Other Ambulatory Visit: Payer: Self-pay | Admitting: Adult Health

## 2022-11-02 ENCOUNTER — Other Ambulatory Visit (INDEPENDENT_AMBULATORY_CARE_PROVIDER_SITE_OTHER): Payer: Self-pay

## 2022-11-02 VITALS — BP 129/79 | HR 80

## 2022-11-02 DIAGNOSIS — F419 Anxiety disorder, unspecified: Secondary | ICD-10-CM

## 2022-11-02 DIAGNOSIS — G5601 Carpal tunnel syndrome, right upper limb: Secondary | ICD-10-CM | POA: Insufficient documentation

## 2022-11-02 DIAGNOSIS — Z981 Arthrodesis status: Secondary | ICD-10-CM

## 2022-11-02 MED ORDER — ALPRAZOLAM 0.25 MG PO TABS: 0.2500 mg | ORAL_TABLET | Freq: Two times a day (BID) | ORAL | 2 refills | Status: DC | PRN

## 2022-11-02 NOTE — Progress Notes (Signed)
Post-Op Visit Note   Patient: Taylor Mccall           Date of Birth: 19-Jun-1945           MRN: [id number] Visit Date: 11/02/2022 PCP: Shirline Frees, NP   Assessment & Plan: Follow-up fusion C3-4 and C5-6 and she had already autofused C4-5.  She has had cervical stenosis for years and surgery was recommended back in 2021.  Slow healing of the bottom level but today there is no motion.  MRI scan was ordered and is reviewed today which shows satisfactory decompression no residual stenosis and no cord changes.  She can discontinue the collar resume her normal activities she does have moderate carpal tunnel on the right and can call when she wants to proceed with outpatient carpal tunnel release, right hand.  She has had electrical studies in the past that showed moderate carpal tunnel been treated conservatively she is dropping objects and wakes her up at night she has to shake her hand and then falls back to sleep sometimes this occurs several times a night.  She wants to get out and do a little bit of gardening plan a few plants now she is out of her collar and she will call when she wants to proceed with the carpal tunnel release.  Chief Complaint:  Chief Complaint  Patient presents with   Spine - Follow-up   Visit Diagnoses:  1. Status post cervical spinal fusion   2. Carpal tunnel syndrome, right upper limb     Plan: Discontinue collar she will call when she wants to proceed with right carpal tunnel release.  Follow-Up Instructions: No follow-ups on file.   Orders:  Orders Placed This Encounter  Procedures   XR Cervical Spine 2 or 3 views   No orders of the defined types were placed in this encounter.   Imaging: No results found.  PMFS History: Patient Active Problem List   Diagnosis Date Noted   Carpal tunnel syndrome, right upper limb 11/02/2022   S/P cervical spinal fusion 06/11/2022   Spinal stenosis of cervical region 05/02/2022   Status post revision of total  knee replacement, left 11/25/2020   Failed total knee, left, subsequent encounter 11/24/2020   Status post revision of total hip 04/01/2020   Polyethylene liner wear following total hip arthroplasty requiring isolated polyethylene liner exchange (HCC) 03/31/2020   Intermediate stage nonexudative age-related macular degeneration of right eye 12/23/2019   Exudative age-related macular degeneration of left eye with inactive choroidal neovascularization (HCC) 12/23/2019   Primary open angle glaucoma of both eyes, moderate stage 12/23/2019   Exudative retinopathy of left eye 12/23/2019   Routine general medical examination at a health care facility 12/11/2016   Primary osteoarthritis of left hip 05/01/2016   Autoimmune disease (HCC) 11/29/2015   High risk medication use 11/29/2015   Vitamin D deficiency 11/29/2015   Macular degeneration 11/29/2015   Bilateral lower extremity edema 10/26/2015   H/O total knee replacement, bilateral 12/09/2013   Breast mass, left 01/18/2011   Obesity 12/08/2009   Venous (peripheral) insufficiency 07/28/2009   PAIN IN JOINT, ANKLE AND FOOT 01/12/2008   Obstructive sleep apnea 08/29/2007   GAIT DISTURBANCE 08/14/2007   Depression 08/26/2006   Essential hypertension 08/26/2006   Primary osteoarthritis of both hands 08/26/2006   Urinary incontinence 08/26/2006   Past Medical History:  Diagnosis Date   Anxiety    Blood transfusion without reported diagnosis    Breast mass, left    Cataract  Depression    Difficult intubation 04/01/2020   Known difficult airway from previous records   Gait disturbance    GERD (gastroesophageal reflux disease)    Hypertension    Lymphedema of both lower extremities    Seeing OT at [facility] - dreeeing to Left Lower leg   Obesity    Rheumatoid arthritis(714.0)    Sleep apnea    cpap   Urinary incontinence    Venous insufficiency     Family History  Problem Relation Age of Onset   Coronary artery  disease Father    Skin cancer Father    Heart disease Father    Asthma Brother    Heart disease Brother    Coronary artery disease Brother    Diabetes Other    Stroke Other    Colon cancer Neg Hx    Esophageal cancer Neg Hx    Rectal cancer Neg Hx    Stomach cancer Neg Hx     Past Surgical History:  Procedure Laterality Date   ABDOMINAL HYSTERECTOMY     with vesicovaginal fistula closure   ANTERIOR CERVICAL DECOMP/DISCECTOMY FUSION N/A 06/11/2022   Procedure: ANTERIOR CERVICAL DISCECTOMY FUSION C3-4, C5-6 WITH ALLOGRAFT, PLATE;  Surgeon: Eldred Manges, MD;  Location: [facility] OR;  Service: Orthopedics;  Laterality: N/A;   ANTERIOR HIP REVISION Left 04/01/2020   Procedure: ANTERIOR LEFT HIP REVISION OF ACETABULAR COMPONENT;  Surgeon: Kathryne Hitch, MD;  Location: [facility];  Service: Orthopedics;  Laterality: Left;  3E   BLADDER SUSPENSION  2009   sling   BREAST REDUCTION SURGERY     COLONOSCOPY     ESOPHAGOGASTRODUODENOSCOPY     EYE SURGERY     REFRACTIVE SURGERY Right 06/26/2021   REPLACEMENT TOTAL KNEE Left    TOE SURGERY Left    Left great toe   toenail removal Right    all 5 toenails   TOTAL HIP ARTHROPLASTY     TOTAL KNEE ARTHROPLASTY Right 12/09/2013   Procedure: Right Total Knee Arthroplasty;  Surgeon: Nadara Mustard, MD;  Location: [facility] OR;  Service: Orthopedics;  Laterality: Right;   TOTAL KNEE REVISION Left 11/25/2020   Procedure: LEFT TOTAL KNEE REVISION;  Surgeon: Kathryne Hitch, MD;  Location: [facility];  Service: Orthopedics;  Laterality: Left;   Social History   Occupational History   Not on file  Tobacco Use   Smoking status: Former    Current packs/day: 0.00    Average packs/day: 0.3 packs/day for 10.0 years (3.0 ttl pk-yrs)    Types: Cigarettes    Start date: 02/06/1968    Quit date: 02/05/1978    Years since quitting: 44.7    Passive exposure: Never   Smokeless tobacco: Never   Tobacco comments:    over 25 years ago...pt doesnt remember when she  quit.   Vaping Use   Vaping status: Never Used  Substance and Sexual Activity   Alcohol use: No   Drug use: No   Sexual activity: Not on file

## 2022-11-02 NOTE — Telephone Encounter (Signed)
Okay for refill?  

## 2022-11-30 ENCOUNTER — Telehealth: Payer: Self-pay

## 2022-11-30 NOTE — Telephone Encounter (Signed)
Eileen Stanford from [place] contacted the office and states the patient had labs drawn on 08/15/2022 with [facility]. Eileen Stanford states the patient's labs were coded in as experimental. Eileen Stanford states they need the patient's office visit note and labs faxed to [phone] so they can review the claim to try to get the insurance to cover the labs. Eileen Stanford states to call the patient when the paperwork is faxed so that the patient can call [employer]. The [employer] call back number is [phone]. Eileen Stanford states she did try to call medical records and billing and neither were able to assist.

## 2022-12-03 NOTE — Telephone Encounter (Signed)
Faxed office notes and labs as requested. Called patient to advise.

## 2022-12-07 IMAGING — RF DG HIP (WITH PELVIS) OPERATIVE*L*
1 series · 2 of 2 positions shown · non-contrast
Comparison: January 05, 2020.

CLINICAL DATA: Revision of left hip arthroplasty.

EXAM:
OPERATIVE left HIP (WITH PELVIS IF PERFORMED) 2 VIEWS
TECHNIQUE: Fluoroscopic spot image(s) were submitted for interpretation
post-operatively.
Radiation exposure index: 1.3801 mGy.

[Series 1: unknown protocol · 0.20mm/px · 2 of 2 slices shown]
[im 1/2]
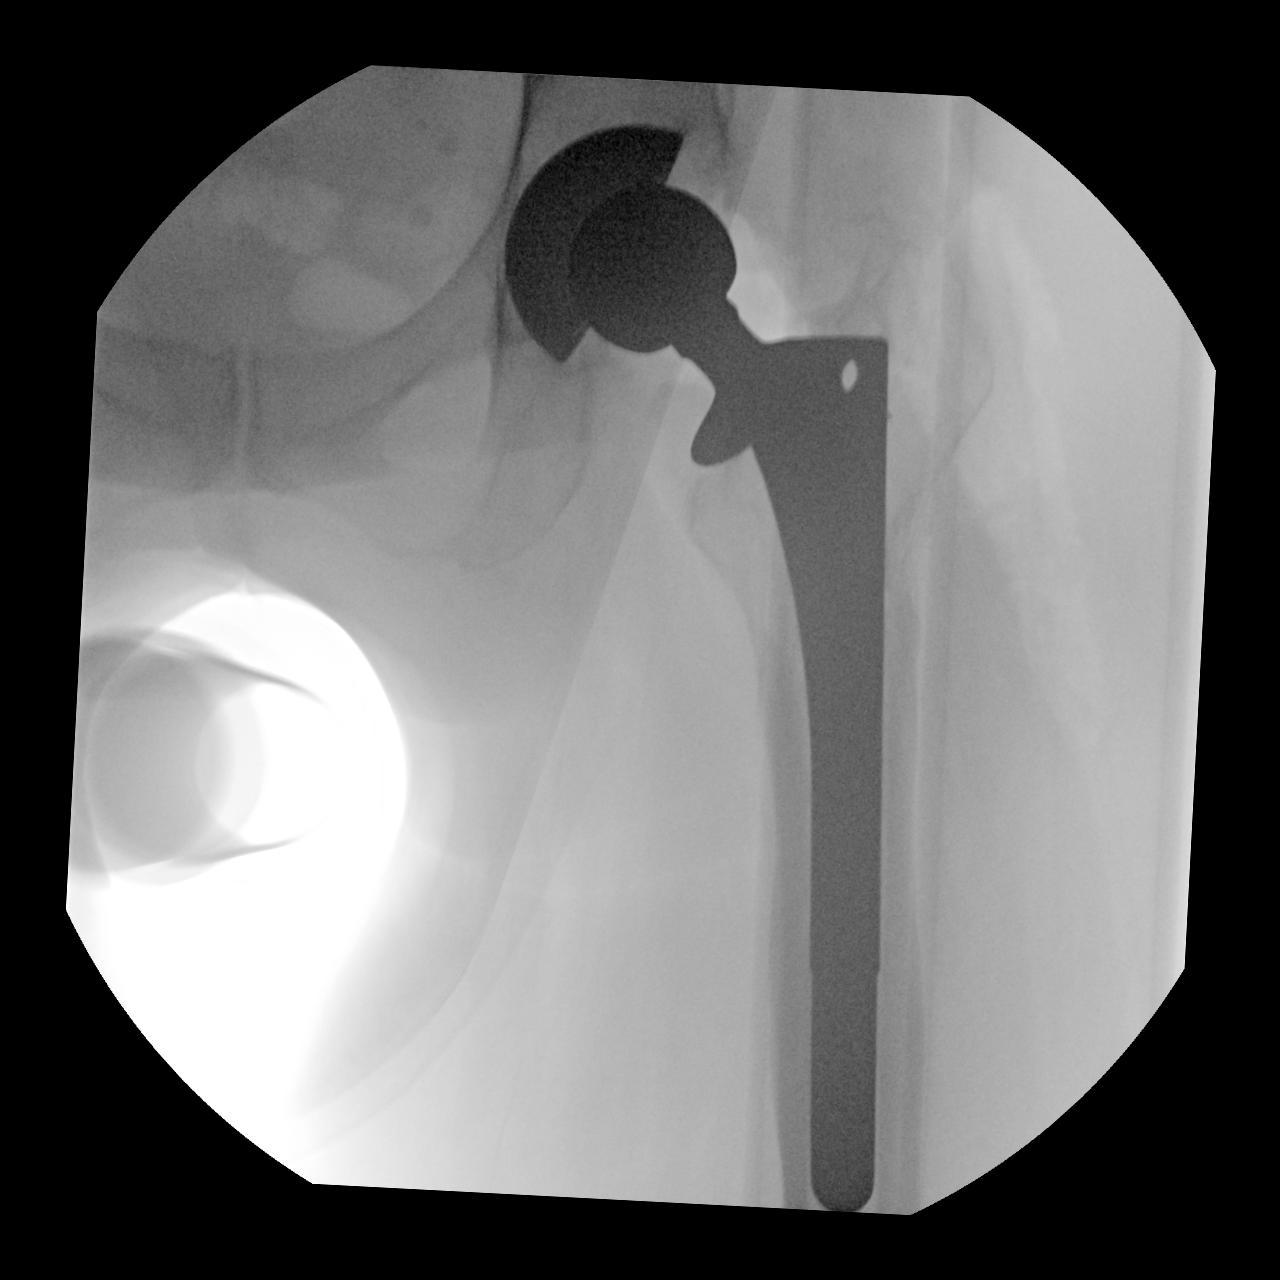
[im 2/2]
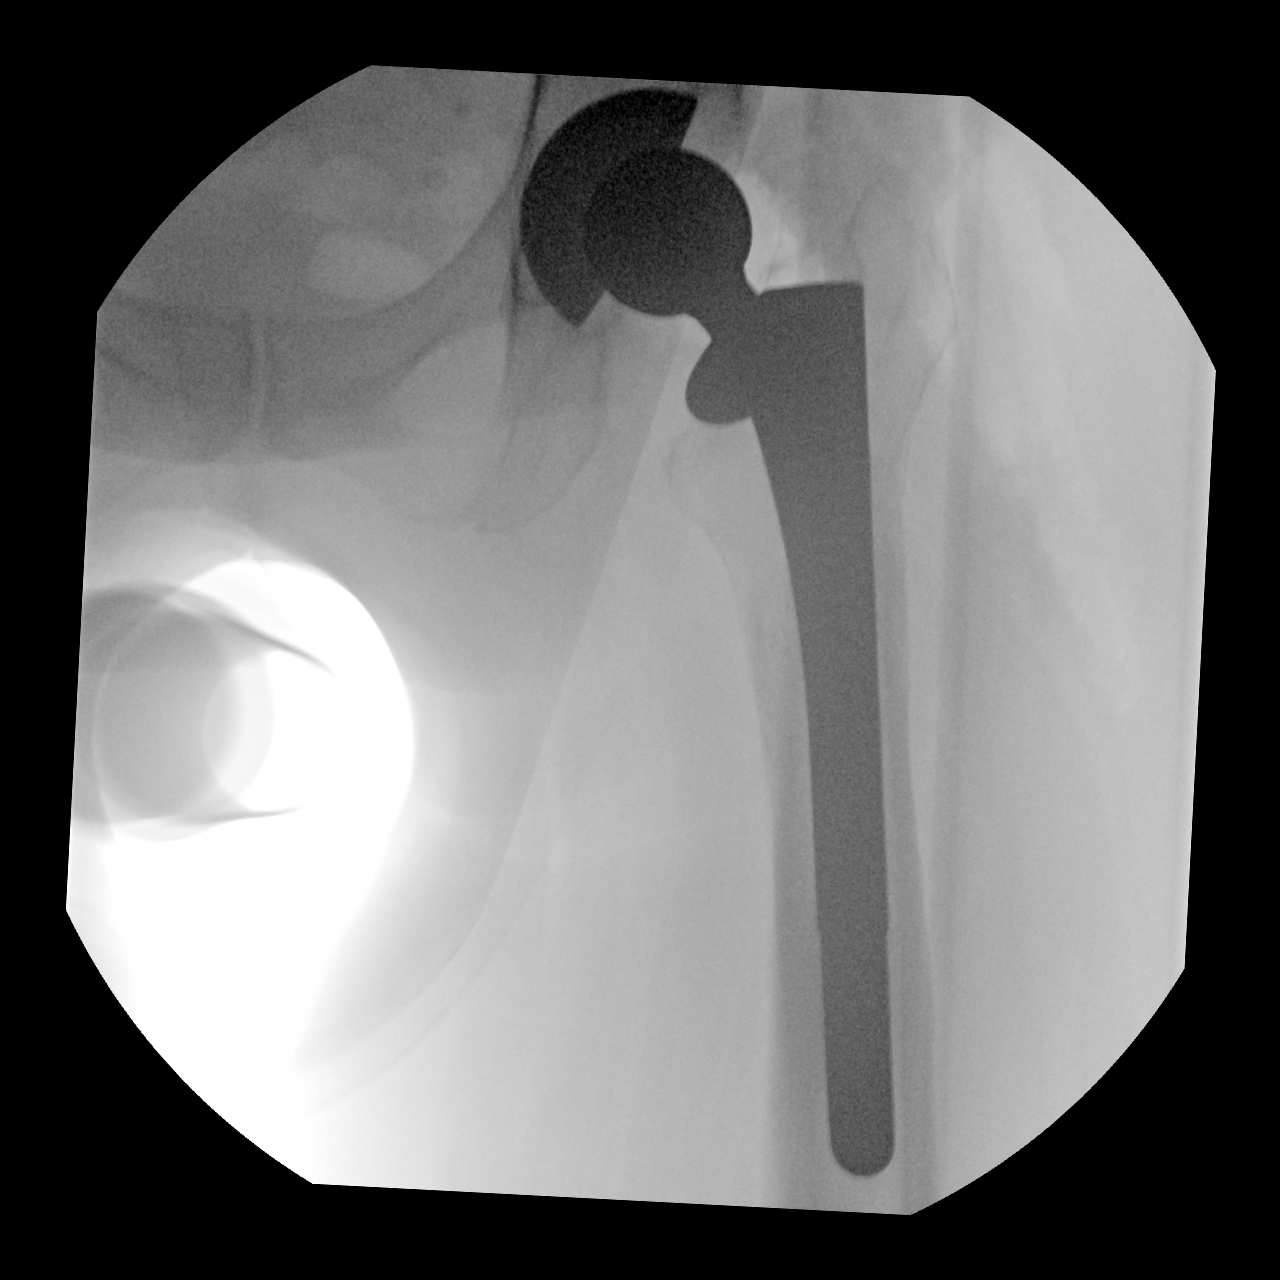

[2 of 2 positions shown; findings below may reference images not displayed]

FINDINGS: The left femoral and acetabular components appear to be well
situated.
IMPRESSION: Status post revision of left hip arthroplasty.

## 2022-12-07 IMAGING — DX DG PORTABLE PELVIS
1 series · 1 of 1 positions shown · non-contrast
Comparison: Intraoperative left hip images obtained April 01, 2020 earlier in the day

CLINICAL DATA: Status post total hip replacement

EXAM:
PORTABLE PELVIS 1-2 VIEWS

[pelvis ap]
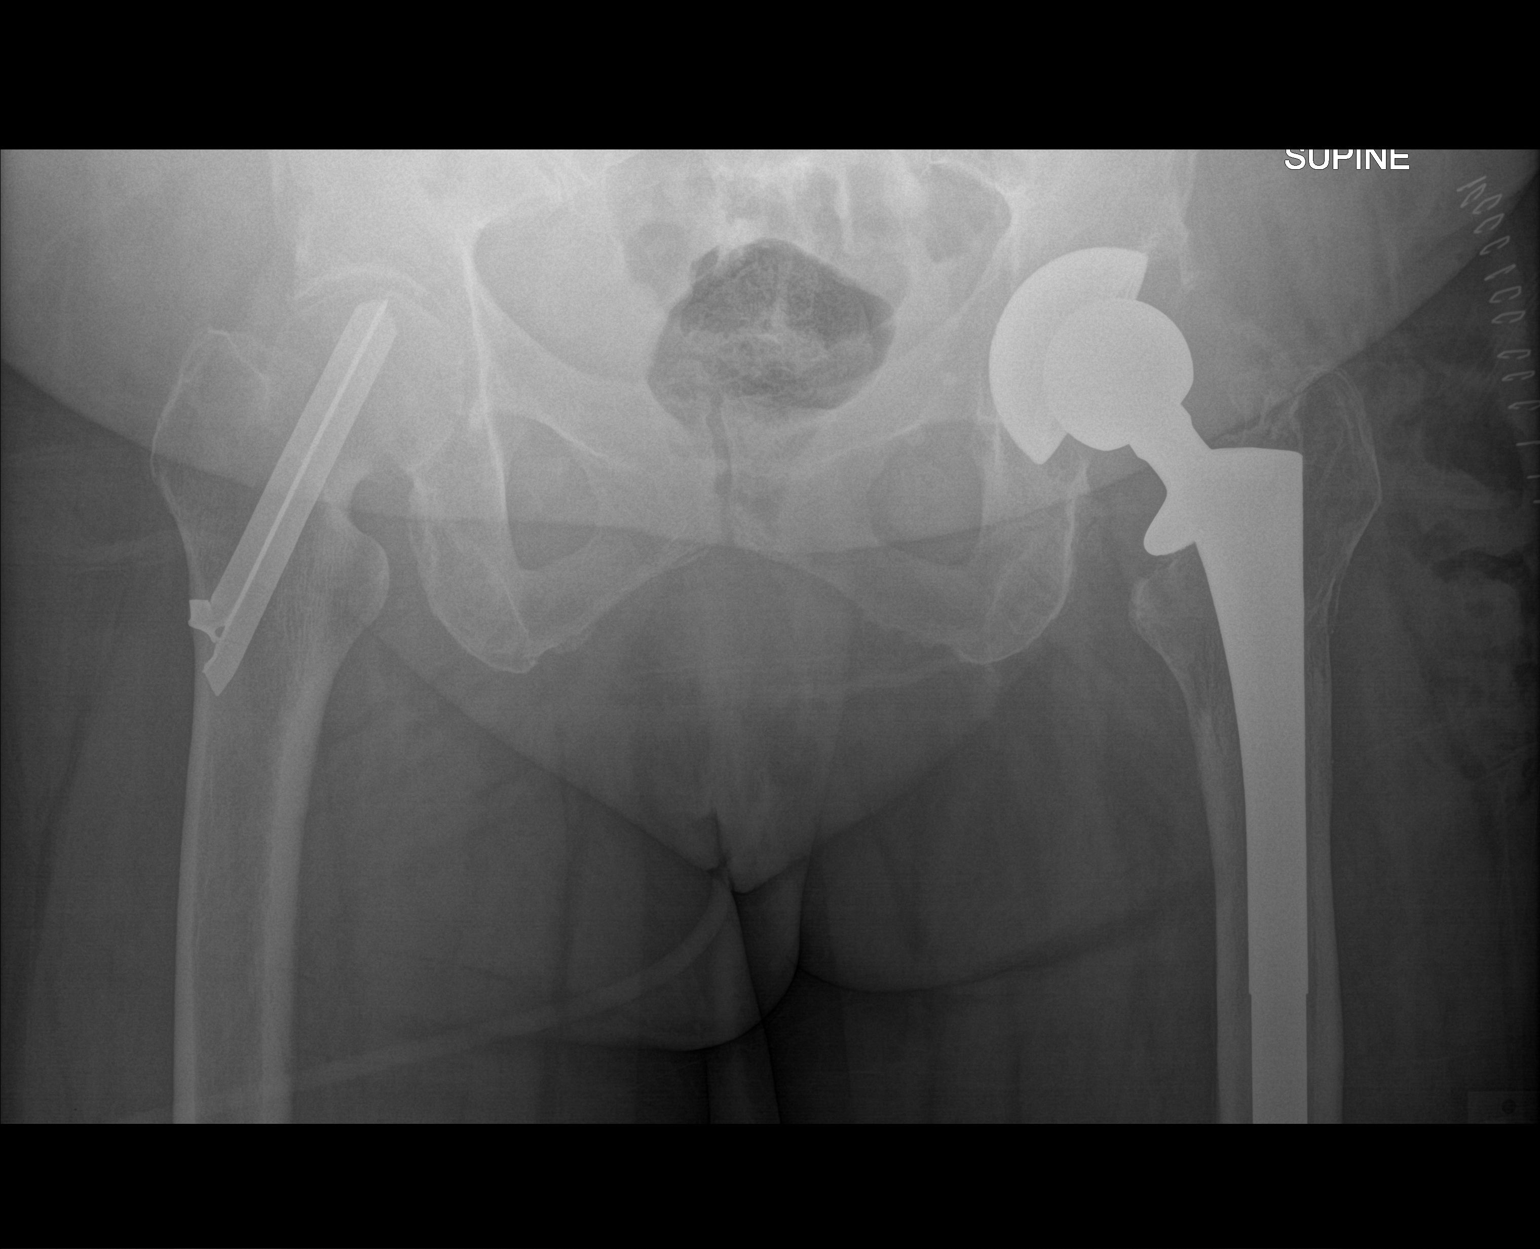

[1 of 1 positions shown; findings below may reference images not displayed]

FINDINGS: Frontal view of mid to lower pelvis and hips obtained. There is a
total hip replacement on the left with prosthetic components
appearing well-seated on frontal view. There is postoperative
fixation in the proximal right femur with alignment anatomic. No
fracture or dislocation.
IMPRESSION: Postoperative changes bilaterally. Total hip replacement noted on
the left with prosthetic components well-seated on frontal view. No
acute fracture or dislocation.

## 2022-12-14 ENCOUNTER — Other Ambulatory Visit: Payer: Self-pay | Admitting: Adult Health

## 2022-12-14 NOTE — Telephone Encounter (Signed)
 Patient need to schedule for more refills.

## 2022-12-16 ENCOUNTER — Other Ambulatory Visit: Payer: Self-pay | Admitting: Adult Health

## 2022-12-16 DIAGNOSIS — Z0000 Encounter for general adult medical examination without abnormal findings: Secondary | ICD-10-CM

## 2022-12-27 ENCOUNTER — Other Ambulatory Visit: Payer: Self-pay | Admitting: Adult Health

## 2022-12-27 DIAGNOSIS — Z0000 Encounter for general adult medical examination without abnormal findings: Secondary | ICD-10-CM

## 2022-12-27 DIAGNOSIS — I10 Essential (primary) hypertension: Secondary | ICD-10-CM

## 2022-12-28 ENCOUNTER — Other Ambulatory Visit: Payer: Self-pay

## 2022-12-28 DIAGNOSIS — I10 Essential (primary) hypertension: Secondary | ICD-10-CM

## 2022-12-28 DIAGNOSIS — Z0000 Encounter for general adult medical examination without abnormal findings: Secondary | ICD-10-CM

## 2022-12-28 MED ORDER — POTASSIUM CHLORIDE CRYS ER 20 MEQ PO TBCR: 20.0000 meq | EXTENDED_RELEASE_TABLET | Freq: Every day | ORAL | 0 refills | Status: DC

## 2022-12-28 NOTE — Telephone Encounter (Signed)
Pt needs a follow up visit. Tried to call pt no answer. IS pt suppose to still be on the this medication?

## 2022-12-29 ENCOUNTER — Other Ambulatory Visit: Payer: Self-pay | Admitting: Adult Health

## 2023-01-01 NOTE — Progress Notes (Unsigned)
Office Visit Note  Patient: Taylor Mccall             Date of Birth: 11/16/1945           MRN: [id number]             PCP: Shirline Frees, NP Referring: Shirline Frees, NP Visit Date: 01/15/2023 Occupation: @GUAROCC @  Subjective:  Medication monitoring   History of Present Illness: Taylor Mccall is a 77 y.o. female with history of autoimmune disease and osteoarthritis.  Patient remains on Plaquenil 200 mg 1 tablet by mouth twice daily.  She is tolerating Plaquenil without any side effects.  Patient reports that she needs to establish care with a new ophthalmologist since [facility] no longer accepts her insurance.  Patient denies any signs or symptoms of a flare recently.  She continues to have aching and joint stiffness but denies any joint swelling.  She has lymphedema in bilateral lower extremities but has not been using her lymphedema pumps recently.  Patient states that she been diagnosed with peripheral arterial disease and has been following up with her PCP on 01/31/2023. Patient continues to have symptoms of dry mouth but has not had any oral or nasal ulcerations.  She has not had any symptoms of Raynaud's phenomenon.  She denies any recent rashes.  She has not had any shortness of breath.  She continues to experience fatigue on a daily basis.   Activities of Daily Living:  Patient reports morning stiffness for all day. Patient Reports nocturnal pain.  Difficulty dressing/grooming: Reports Difficulty climbing stairs: Reports Difficulty getting out of chair: Reports Difficulty using hands for taps, buttons, cutlery, and/or writing: Reports  Review of Systems  Constitutional:  Positive for fatigue.  HENT:  Positive for mouth dryness. Negative for mouth sores.   Eyes:  Positive for dryness. Negative for pain.  Respiratory:  Negative for shortness of breath.   Cardiovascular:  Negative for chest pain and palpitations.  Gastrointestinal:  Negative for blood in  stool, constipation and diarrhea.  Endocrine: Negative for increased urination.  Genitourinary:  Negative for involuntary urination.  Musculoskeletal:  Positive for joint pain, gait problem, joint pain, joint swelling, myalgias, muscle weakness, morning stiffness, muscle tenderness and myalgias.  Skin:  Negative for color change, rash, hair loss and sensitivity to sunlight.  Allergic/Immunologic: Negative for susceptible to infections.  Neurological:  Positive for numbness. Negative for dizziness and headaches.  Hematological:  Negative for swollen glands.  Psychiatric/Behavioral:  Positive for sleep disturbance. Negative for depressed mood. The patient is not nervous/anxious.     PMFS History:  Patient Active Problem List   Diagnosis Date Noted   Carpal tunnel syndrome, right upper limb 11/02/2022   S/P cervical spinal fusion 06/11/2022   Spinal stenosis of cervical region 05/02/2022   Status post revision of total knee replacement, left 11/25/2020   Failed total knee, left, subsequent encounter 11/24/2020   Status post revision of total hip 04/01/2020   Polyethylene liner wear following total hip arthroplasty requiring isolated polyethylene liner exchange (HCC) 03/31/2020   Intermediate stage nonexudative age-related macular degeneration of right eye 12/23/2019   Exudative age-related macular degeneration of left eye with inactive choroidal neovascularization (HCC) 12/23/2019   Primary open angle glaucoma of both eyes, moderate stage 12/23/2019   Exudative retinopathy of left eye 12/23/2019   Routine general medical examination at a health care facility 12/11/2016   Primary osteoarthritis of left hip 05/01/2016   Autoimmune disease (HCC) 11/29/2015   High  risk medication use 11/29/2015   Vitamin D deficiency 11/29/2015   Macular degeneration 11/29/2015   Bilateral lower extremity edema 10/26/2015   H/O total knee replacement, bilateral 12/09/2013   Breast mass, left 01/18/2011    Obesity 12/08/2009   Venous (peripheral) insufficiency 07/28/2009   PAIN IN JOINT, ANKLE AND FOOT 01/12/2008   Obstructive sleep apnea 08/29/2007   GAIT DISTURBANCE 08/14/2007   Depression 08/26/2006   Essential hypertension 08/26/2006   Primary osteoarthritis of both hands 08/26/2006   Urinary incontinence 08/26/2006    Past Medical History:  Diagnosis Date   Anxiety    Blood transfusion without reported diagnosis    Breast mass, left    Cataract    Depression    Difficult intubation 04/01/2020   Known difficult airway from previous records   Gait disturbance    GERD (gastroesophageal reflux disease)    Hypertension    Lymphedema of both lower extremities    Seeing OT at [facility] - dreeeing to Left Lower leg   Obesity    Rheumatoid arthritis(714.0)    Sleep apnea    cpap   Urinary incontinence    Venous insufficiency     Family History  Problem Relation Age of Onset   Coronary artery disease Father    Skin cancer Father    Heart disease Father    Asthma Brother    Heart disease Brother    Coronary artery disease Brother    Diabetes Other    Stroke Other    Colon cancer Neg Hx    Esophageal cancer Neg Hx    Rectal cancer Neg Hx    Stomach cancer Neg Hx    Past Surgical History:  Procedure Laterality Date   ABDOMINAL HYSTERECTOMY     with vesicovaginal fistula closure   ANTERIOR CERVICAL DECOMP/DISCECTOMY FUSION N/A 06/11/2022   Procedure: ANTERIOR CERVICAL DISCECTOMY FUSION C3-4, C5-6 WITH ALLOGRAFT, PLATE;  Surgeon: Eldred Manges, MD;  Location: [facility] OR;  Service: Orthopedics;  Laterality: N/A;   ANTERIOR HIP REVISION Left 04/01/2020   Procedure: ANTERIOR LEFT HIP REVISION OF ACETABULAR COMPONENT;  Surgeon: Kathryne Hitch, MD;  Location: [facility] ORS;  Service: Orthopedics;  Laterality: Left;  3E   BLADDER SUSPENSION  2009   sling   BREAST REDUCTION SURGERY     COLONOSCOPY     ESOPHAGOGASTRODUODENOSCOPY     EYE SURGERY     REFRACTIVE  SURGERY Right 06/26/2021   REPLACEMENT TOTAL KNEE Left    TOE SURGERY Left    Left great toe   toenail removal Right    all 5 toenails   TOTAL HIP ARTHROPLASTY     TOTAL KNEE ARTHROPLASTY Right 12/09/2013   Procedure: Right Total Knee Arthroplasty;  Surgeon: Nadara Mustard, MD;  Location: [facility] OR;  Service: Orthopedics;  Laterality: Right;   TOTAL KNEE REVISION Left 11/25/2020   Procedure: LEFT TOTAL KNEE REVISION;  Surgeon: Kathryne Hitch, MD;  Location: [facility] ORS;  Service: Orthopedics;  Laterality: Left;   Social History   Social History Narrative   Not on file   Immunization History  Administered Date(s) Administered   Fluad Quad(high Dose 65+) 11/03/2021   Influenza Split 12/14/2010, 12/06/2012   Influenza Whole 02/06/2004, 12/08/2009   Influenza, High Dose Seasonal PF 10/26/2015, 12/11/2016, 01/06/2018, 10/07/2018, 11/15/2020, 12/17/2022   Influenza,inj,Quad PF,6+ Mos 10/15/2013, 11/19/2014   Influenza-Unspecified 10/07/2018   PFIZER(Purple Top)SARS-COV-2 Vaccination 03/01/2019, 03/21/2019, 10/06/2019   Pfizer Covid-19 Vaccine Bivalent Booster 24yrs & up 11/15/2020  Pfizer(Comirnaty)Fall Seasonal Vaccine 12 years and older 12/17/2022   Pneumococcal Conjugate-13 03/18/2013   Pneumococcal Polysaccharide-23 06/30/2008, 10/26/2015   Td 02/05/2005   Tdap 12/14/2010, 11/03/2021     Objective: Vital Signs: BP 122/77 (BP Location: Left Arm, Patient Position: Sitting, Cuff Size: Normal)   Pulse 77   Resp 16   Ht 5\' 10"  (1.778 m)   Wt 265 lb 6.4 oz (120.4 kg)   BMI 38.08 kg/m    Physical Exam Vitals and nursing note reviewed.  Constitutional:      Appearance: She is well-developed.  HENT:     Head: Normocephalic and atraumatic.  Eyes:     Conjunctiva/sclera: Conjunctivae normal.  Cardiovascular:     Rate and Rhythm: Normal rate and regular rhythm.     Heart sounds: Normal heart sounds.  Pulmonary:     Effort: Pulmonary effort is normal.     Breath sounds:  Normal breath sounds.  Abdominal:     General: Bowel sounds are normal.     Palpations: Abdomen is soft.  Musculoskeletal:     Cervical back: Normal range of motion.  Lymphadenopathy:     Cervical: No cervical adenopathy.  Skin:    General: Skin is warm and dry.     Capillary Refill: Capillary refill takes less than 2 seconds.  Neurological:     Mental Status: She is alert and oriented to person, place, and time.  Psychiatric:        Behavior: Behavior normal.      Musculoskeletal Exam: Patient remained seated during the examination today.  C-spine has severely limited range of motion.  Some discomfort and stiffness with range of motion of both shoulder joints.  Elbow joints, wrist joints, MCPs, PIPs, DIPs have good range of motion with no synovitis.  PIP and DIP thickening noted.  Left hip replacement range of motion difficult assess in seated position.  Stiffness with range of motion of both knee replacements.  Lymphedema noted in bilateral lower extremities.  CDAI Exam: CDAI Score: -- Patient Global: --; Provider Global: -- Swollen: --; Tender: -- Joint Exam 01/15/2023   No joint exam has been documented for this visit   There is currently no information documented on the homunculus. Go to the Rheumatology activity and complete the homunculus joint exam.  Investigation: No additional findings.  Imaging: No results found.  Recent Labs: Lab Results  Component Value Date   WBC 3.9 08/15/2022   HGB 11.0 (L) 08/15/2022   PLT 221 08/15/2022   NA 142 08/15/2022   K 3.7 08/15/2022   CL 106 08/15/2022   CO2 29 08/15/2022   GLUCOSE 79 08/15/2022   BUN 11 08/15/2022   CREATININE 0.79 08/15/2022   BILITOT 0.8 08/15/2022   ALKPHOS 110 09/14/2021   AST 12 08/15/2022   ALT 7 08/15/2022   PROT 6.7 08/15/2022   ALBUMIN 4.2 09/14/2021   CALCIUM 9.9 08/15/2022   GFRAA 98 07/26/2020    Speciality Comments: PLQ eye exam: 12/11/2021 WNL @ [facility]. MTX in the  past-dcd due to OU, hair loss  Procedures:  No procedures performed Allergies: Penicillins    Assessment / Plan:     Visit Diagnoses: Autoimmune disease (HCC) - +ANA, +RNP, history of inflammatory arthritis:  She has not had any signs or symptoms of an autoimmune disease flare.  She has clinically been doing well taking Plaquenil 200 mg 1 tablet by mouth twice daily.  She is tolerating Plaquenil without any side effects and has not had  any gaps in therapy.  She has no synovitis on examination today.  Patient continues to have chronic sicca symptoms which have been manageable.  She has not had any oral or nasal ulcerations.  No recent rashes.  No symptoms of Raynaud's phenomenon.  She has not had any shortness of breath and her lungs were clear to auscultation today. Lab work from 08/15/22 was reviewed today in the office: dsDNA negative, ESR WNL, complements WNL, urine protein creatinine ratio WNL.  Labs were not concerning for a flare of active disease at that time.  The following lab work will be obtained today for further evaluation.  She was advised to notify us if she develops any signs or symptoms of a flare.  She will remain on Plaquenil as prescribed.  She will follow-up in the office in 5 months or sooner if needed. - Plan: Protein / creatinine ratio, urine, CBC with Differential/Platelet, COMPLETE METABOLIC PANEL WITH GFR, Anti-DNA antibody, double-stranded, C3 and C4, Sedimentation rate  High risk medication use - Plaquenil 200 mg 1 tablet by mouth twice daily. CBC and CMP updated on 08/15/22. Orders for CBC and CMP released today.  PLQ eye exam: 12/11/2021 WNL @ [facility].  A referral to [facility] will be placed today to establish care since [facility] ophthalmology no longer accepts her insurance..  Patient was given a Plaquenil eye examination form to take with her to her next appointment.  - Plan: CBC with Differential/Platelet, COMPLETE METABOLIC PANEL WITH GFR  Primary  osteoarthritis of both hands: She has PIP and DIP thickening consistent with osteoarthritis of both hands.  No synovitis noted on examination today.  Complete fist formation bilaterally.  Chronic left shoulder pain: Limited range of motion of the left shoulder joint noted on examination today.  Carpal tunnel syndrome, right upper limb: Not currently symptomatic.  S/P total left hip arthroplasty - Revision February 2022 by Dr. Magnus Ivan. Doing well.  Using rollator walker to assist with ambulation.  H/O total knee replacement, bilateral: Patient has chronic stiffness involving both knee replacements.  She uses a rollator walker to assist with ambulation. Lymphedema noted in bilateral lower extremities.  She has not been using her lymphedema pumps recently.  DDD (degenerative disc disease), cervical - Severe multilevel spinal stenosis>>s/p anterior cervical fusion-performed by Dr. Ophelia Charter on 06/11/22.  No longer wearing cervical collar.  Limited range of motion noted on examination today.  Degeneration of intervertebral disc of lumbar region without discogenic back pain or lower extremity pain: Chronic pain.  No symptoms of radiculopathy.  Other medical conditions are listed as follows:  Vitamin D deficiency: She is taking vitamin D 10,000 units daily.  Lymphedema: Patient has lymphedema involving bilateral lower extremities.  She has not been using her lymphedema pumps recently.  History of macular degeneration  History of glaucoma  History of depression  History of sleep apnea  Orders: Orders Placed This Encounter  Procedures   Protein / creatinine ratio, urine   CBC with Differential/Platelet   COMPLETE METABOLIC PANEL WITH GFR   Anti-DNA antibody, double-stranded   C3 and C4   Sedimentation rate   No orders of the defined types were placed in this encounter.    Follow-Up Instructions: Return in about 5 months (around 06/15/2023) for Autoimmune Disease,  Osteoarthritis.   Gearldine Bienenstock, PA-C  Note - This record has been created using Dragon software.  Chart creation errors have been sought, but may not always  have been located. Such creation errors do  not reflect on  the standard of medical care.

## 2023-01-01 NOTE — Telephone Encounter (Signed)
Patient need to schedule CPE for more refills.

## 2023-01-11 ENCOUNTER — Other Ambulatory Visit: Payer: Self-pay

## 2023-01-11 ENCOUNTER — Other Ambulatory Visit: Payer: Self-pay | Admitting: Adult Health

## 2023-01-11 ENCOUNTER — Telehealth: Payer: Self-pay

## 2023-01-11 DIAGNOSIS — I10 Essential (primary) hypertension: Secondary | ICD-10-CM

## 2023-01-11 DIAGNOSIS — Z0000 Encounter for general adult medical examination without abnormal findings: Secondary | ICD-10-CM

## 2023-01-15 ENCOUNTER — Other Ambulatory Visit: Payer: Self-pay | Admitting: Adult Health

## 2023-01-15 ENCOUNTER — Ambulatory Visit: Payer: Medicare HMO | Attending: Physician Assistant | Admitting: Physician Assistant

## 2023-01-15 ENCOUNTER — Encounter: Payer: Self-pay | Admitting: Physician Assistant

## 2023-01-15 VITALS — BP 122/77 | HR 77 | Resp 16 | Ht 70.0 in | Wt 265.4 lb

## 2023-01-15 DIAGNOSIS — M19041 Primary osteoarthritis, right hand: Secondary | ICD-10-CM

## 2023-01-15 DIAGNOSIS — E559 Vitamin D deficiency, unspecified: Secondary | ICD-10-CM

## 2023-01-15 DIAGNOSIS — Z96653 Presence of artificial knee joint, bilateral: Secondary | ICD-10-CM

## 2023-01-15 DIAGNOSIS — M25512 Pain in left shoulder: Secondary | ICD-10-CM

## 2023-01-15 DIAGNOSIS — M51369 Other intervertebral disc degeneration, lumbar region without mention of lumbar back pain or lower extremity pain: Secondary | ICD-10-CM

## 2023-01-15 DIAGNOSIS — Z8659 Personal history of other mental and behavioral disorders: Secondary | ICD-10-CM

## 2023-01-15 DIAGNOSIS — M359 Systemic involvement of connective tissue, unspecified: Secondary | ICD-10-CM

## 2023-01-15 DIAGNOSIS — Z96642 Presence of left artificial hip joint: Secondary | ICD-10-CM

## 2023-01-15 DIAGNOSIS — G8929 Other chronic pain: Secondary | ICD-10-CM

## 2023-01-15 DIAGNOSIS — I10 Essential (primary) hypertension: Secondary | ICD-10-CM

## 2023-01-15 DIAGNOSIS — Z79899 Other long term (current) drug therapy: Secondary | ICD-10-CM

## 2023-01-15 DIAGNOSIS — Z0000 Encounter for general adult medical examination without abnormal findings: Secondary | ICD-10-CM

## 2023-01-15 DIAGNOSIS — I890 Lymphedema, not elsewhere classified: Secondary | ICD-10-CM

## 2023-01-15 DIAGNOSIS — G5601 Carpal tunnel syndrome, right upper limb: Secondary | ICD-10-CM

## 2023-01-15 DIAGNOSIS — M19042 Primary osteoarthritis, left hand: Secondary | ICD-10-CM

## 2023-01-15 DIAGNOSIS — Z8669 Personal history of other diseases of the nervous system and sense organs: Secondary | ICD-10-CM

## 2023-01-15 DIAGNOSIS — M5030 Other cervical disc degeneration, unspecified cervical region: Secondary | ICD-10-CM

## 2023-01-15 MED ORDER — POTASSIUM CHLORIDE CRYS ER 20 MEQ PO TBCR
20.0000 meq | EXTENDED_RELEASE_TABLET | Freq: Every day | ORAL | 1 refills | Status: DC
Start: 2023-01-15 — End: 2023-01-15

## 2023-01-15 MED ORDER — POTASSIUM CHLORIDE CRYS ER 20 MEQ PO TBCR
20.0000 meq | EXTENDED_RELEASE_TABLET | Freq: Every day | ORAL | 1 refills | Status: DC
Start: 2023-01-15 — End: 2023-07-30

## 2023-01-15 NOTE — Addendum Note (Signed)
Addended by: Ellen Henri on: 01/15/2023 03:39 PM   Modules accepted: Orders

## 2023-01-16 LAB — COMPLETE METABOLIC PANEL WITH GFR
AG Ratio: 1.4 (calc) (ref 1.0–2.5)
ALT: 10 U/L (ref 6–29)
AST: 14 U/L (ref 10–35)
Albumin: 4.2 g/dL (ref 3.6–5.1)
Alkaline phosphatase (APISO): 125 U/L (ref 37–153)
BUN: 17 mg/dL (ref 7–25)
CO2: 29 mmol/L (ref 20–32)
Calcium: 9.6 mg/dL (ref 8.6–10.4)
Chloride: 105 mmol/L (ref 98–110)
Creat: 0.83 mg/dL (ref 0.60–1.00)
Globulin: 2.9 g/dL (ref 1.9–3.7)
Glucose, Bld: 76 mg/dL (ref 65–99)
Potassium: 4 mmol/L (ref 3.5–5.3)
Sodium: 142 mmol/L (ref 135–146)
Total Bilirubin: 0.8 mg/dL (ref 0.2–1.2)
Total Protein: 7.1 g/dL (ref 6.1–8.1)
eGFR: 72 mL/min/{1.73_m2} (ref >=60)

## 2023-01-16 LAB — CBC WITH DIFFERENTIAL/PLATELET
Absolute Lymphocytes: 1252 {cells}/uL (ref 850–3900)
Absolute Monocytes: 357 {cells}/uL (ref 200–950)
Basophils Absolute: 50 {cells}/uL (ref 0–200)
Basophils Relative: 1.2 %
Eosinophils Absolute: 277 {cells}/uL (ref 15–500)
Eosinophils Relative: 6.6 %
HCT: 37.5 % (ref 35.0–45.0)
Hemoglobin: 11.9 g/dL (ref 11.7–15.5)
MCH: 28.4 pg (ref 27.0–33.0)
MCHC: 31.7 g/dL — ABNORMAL LOW (ref 32.0–36.0)
MCV: 89.5 fL (ref 80.0–100.0)
MPV: 11.4 fL (ref 7.5–12.5)
Monocytes Relative: 8.5 %
Neutro Abs: 2264 {cells}/uL (ref 1500–7800)
Neutrophils Relative %: 53.9 %
Platelets: 240 10*3/uL (ref 140–400)
RBC: 4.19 10*6/uL (ref 3.80–5.10)
RDW: 11.9 % (ref 11.0–15.0)
Total Lymphocyte: 29.8 %
WBC: 4.2 10*3/uL (ref 3.8–10.8)

## 2023-01-16 LAB — C3 AND C4
C3 Complement: 119 mg/dL (ref 83–193)
C4 Complement: 25 mg/dL (ref 15–57)

## 2023-01-16 LAB — PROTEIN / CREATININE RATIO, URINE
Creatinine, Urine: 161 mg/dL (ref 20–275)
Protein/Creat Ratio: 81 mg/g{creat} (ref 24–184)
Protein/Creatinine Ratio: 0.081 mg/mg{creat} (ref 0.024–0.184)
Total Protein, Urine: 13 mg/dL (ref 5–24)

## 2023-01-16 LAB — SEDIMENTATION RATE: Sed Rate: 6 mm/h (ref 0–30)

## 2023-01-16 LAB — ANTI-DNA ANTIBODY, DOUBLE-STRANDED: ds DNA Ab: <1 [IU]/mL

## 2023-01-16 NOTE — Progress Notes (Signed)
CBC and CMP WNL Complements WNL  ESR WNL Urine protein creatinine ratio WNL.

## 2023-01-17 NOTE — Progress Notes (Signed)
dsDNA is negative

## 2023-01-31 ENCOUNTER — Encounter: Payer: Self-pay | Admitting: Adult Health

## 2023-01-31 ENCOUNTER — Ambulatory Visit: Payer: Medicare HMO | Admitting: Adult Health

## 2023-01-31 VITALS — BP 136/82 | HR 98 | Temp 97.6°F | Ht 69.75 in | Wt 266.0 lb

## 2023-01-31 DIAGNOSIS — Z0000 Encounter for general adult medical examination without abnormal findings: Secondary | ICD-10-CM

## 2023-01-31 DIAGNOSIS — I890 Lymphedema, not elsewhere classified: Secondary | ICD-10-CM

## 2023-01-31 DIAGNOSIS — F325 Major depressive disorder, single episode, in full remission: Secondary | ICD-10-CM

## 2023-01-31 DIAGNOSIS — M359 Systemic involvement of connective tissue, unspecified: Secondary | ICD-10-CM

## 2023-01-31 DIAGNOSIS — M21962 Unspecified acquired deformity of left lower leg: Secondary | ICD-10-CM

## 2023-01-31 DIAGNOSIS — F419 Anxiety disorder, unspecified: Secondary | ICD-10-CM | POA: Diagnosis not present

## 2023-01-31 DIAGNOSIS — M21961 Unspecified acquired deformity of right lower leg: Secondary | ICD-10-CM

## 2023-01-31 DIAGNOSIS — E785 Hyperlipidemia, unspecified: Secondary | ICD-10-CM

## 2023-01-31 DIAGNOSIS — I10 Essential (primary) hypertension: Secondary | ICD-10-CM

## 2023-01-31 DIAGNOSIS — G4733 Obstructive sleep apnea (adult) (pediatric): Secondary | ICD-10-CM

## 2023-01-31 LAB — LIPID PANEL
Cholesterol: 133 mg/dL (ref 0–200)
HDL: 64.3 mg/dL (ref >39.00)
LDL Cholesterol: 42 mg/dL (ref 0–99)
NonHDL: 69.02
Total CHOL/HDL Ratio: 2
Triglycerides: 135 mg/dL (ref 0.0–149.0)
VLDL: 27 mg/dL (ref 0.0–40.0)

## 2023-01-31 LAB — TSH: TSH: 2.36 u[IU]/mL (ref 0.35–5.50)

## 2023-01-31 MED ORDER — FUROSEMIDE 20 MG PO TABS: 20.0000 mg | ORAL_TABLET | Freq: Two times a day (BID) | ORAL | 3 refills | Status: DC

## 2023-01-31 MED ORDER — ROSUVASTATIN CALCIUM 5 MG PO TABS
ORAL_TABLET | ORAL | 0 refills | Status: DC
Start: 2023-01-31 — End: 2023-09-03

## 2023-01-31 MED ORDER — ALPRAZOLAM 0.25 MG PO TABS: 0.2500 mg | ORAL_TABLET | Freq: Two times a day (BID) | ORAL | 2 refills | Status: DC | PRN

## 2023-01-31 NOTE — Patient Instructions (Signed)
It was great seeing you today   We will follow up with you regarding your lab work   Please let me know if you need anything   

## 2023-01-31 NOTE — Progress Notes (Signed)
Subjective:    Patient ID: Taylor Mccall, female    DOB: 1945-04-19, 77 y.o.   MRN: [id number]  HPI Patient presents for yearly preventative medicine examination. She is a pleasant 77 year old female who  has a past medical history of Anxiety, Blood transfusion without reported diagnosis, Breast mass, left, Cataract, Depression, Difficult intubation (04/01/2020), Gait disturbance, GERD (gastroesophageal reflux disease), Hypertension, Lymphedema of both lower extremities, Obesity, Rheumatoid arthritis(714.0), Sleep apnea, Urinary incontinence, and Venous insufficiency.  Essential Hypertension -managed with Lasix 20 mg twice daily and Cozaar 25 mg daily.  She denies dizziness, lightheadedness, chest pain, or shortness of breath BP Readings from Last 3 Encounters:  01/31/23 136/82  01/15/23 122/77  11/02/22 129/79    Autoimmune disease-positive ANA, positive RA.  She is followed by rheumatology.  Currently managed with Plaquenil 200 mg twice daily.  She has not had any recent flares.  She does have chronic pain in bilateral hands and bilateral knees that is more consistent with osteoarthritis  OSA- does not use her cpap any longer. Is looking at an oral appliance.   Depression and Anxiety-takes Zoloft 100 mg nightly and will use Xanax as needed. She feels as controlled as she can be.   Lymphedema of bilateral legs -she has not been using her pumps.   Hyperlipidemia - managed with crestor 5 mg daily, she denies myalgia or fatigue. She has been out of the medication for the last few months  Lab Results  Component Value Date   CHOL 189 09/14/2021   HDL 65.80 09/14/2021   LDLCALC 110 (H) 09/14/2021   LDLDIRECT 129.7 04/11/2006   TRIG 64.0 09/14/2021   CHOLHDL 3 09/14/2021    Bilateral foot pain - he has hammer toes bilaterally and would like to be referred to Podiatry. She was seen by Dr. Charlsie Merles years ago    All immunizations and health maintenance protocols were reviewed with the  patient and needed orders were placed.  Appropriate screening laboratory values were ordered for the patient including screening of hyperlipidemia, renal function and hepatic function.  Medication reconciliation,  past medical history, social history, problem list and allergies were reviewed in detail with the patient  Goals were established with regard to weight loss, exercise, and  diet in compliance with medications Wt Readings from Last 3 Encounters:  01/31/23 266 lb (120.7 kg)  01/15/23 265 lb 6.4 oz (120.4 kg)  08/15/22 261 lb 9.6 oz (118.7 kg)   Review of Systems  Constitutional: Negative.   HENT: Negative.    Eyes: Negative.   Respiratory: Negative.    Cardiovascular: Negative.   Gastrointestinal: Negative.   Endocrine: Negative.   Genitourinary: Negative.   Musculoskeletal:  Positive for arthralgias, back pain, gait problem and joint swelling.  Skin: Negative.   Allergic/Immunologic: Negative.   Hematological: Negative.   Psychiatric/Behavioral:  Positive for dysphoric mood. The patient is nervous/anxious.    Past Medical History:  Diagnosis Date   Anxiety    Blood transfusion without reported diagnosis    Breast mass, left    Cataract    Depression    Difficult intubation 04/01/2020   Known difficult airway from previous records   Gait disturbance    GERD (gastroesophageal reflux disease)    Hypertension    Lymphedema of both lower extremities    Seeing OT at [facility] - dreeeing to Left Lower leg   Obesity    Rheumatoid arthritis(714.0)    Sleep apnea    cpap  Urinary incontinence    Venous insufficiency     Social History   Socioeconomic History   Marital status: Widowed    Spouse name: Not on file   Number of children: Not on file   Years of education: Not on file   Highest education level: 12th grade  Occupational History   Not on file  Tobacco Use   Smoking status: Former    Current packs/day: 0.00    Average packs/day:  0.3 packs/day for 10.0 years (3.0 ttl pk-yrs)    Types: Cigarettes    Start date: 02/06/1968    Quit date: 02/05/1978    Years since quitting: 45.0    Passive exposure: Never   Smokeless tobacco: Never   Tobacco comments:    over 25 years ago...pt doesnt remember when she quit.   Vaping Use   Vaping status: Never Used  Substance and Sexual Activity   Alcohol use: No   Drug use: No   Sexual activity: Not on file  Other Topics Concern   Not on file  Social History Narrative   Not on file   Social Drivers of Health   Financial Resource Strain: Low Risk  (04/24/2021)   Overall Financial Resource Strain (CARDIA)    Difficulty of Paying Living Expenses: Not hard at all  Food Insecurity: No Food Insecurity (03/19/2022)   Hunger Vital Sign    Worried About Running Out of Food in the Last Year: Never true    Ran Out of Food in the Last Year: Never true  Transportation Needs: Patient Declined (03/19/2022)   PRAPARE - Transportation    Lack of Transportation (Medical): Patient declined    Lack of Transportation (Non-Medical): Patient declined  Physical Activity: Unknown (03/19/2022)   Exercise Vital Sign    Days of Exercise per Week: 0 days    Minutes of Exercise per Session: Not on file  Recent Concern: Physical Activity - Inactive (03/19/2022)   Exercise Vital Sign    Days of Exercise per Week: 0 days    Minutes of Exercise per Session: 30 min  Stress: Stress Concern Present (03/19/2022)   [employer] of Occupational Health - Occupational Stress Questionnaire    Feeling of Stress : Rather much  Social Connections: Unknown (03/19/2022)   Social Connection and Isolation Panel [NHANES]    Frequency of Communication with Friends and Family: More than three times a week    Frequency of Social Gatherings with Friends and Family: Once a week    Attends Religious Services: Never    Database administrator or Organizations: Not on file    Attends Banker Meetings: Not on file     Marital Status: Widowed  Catering manager Violence: Not on file    Past Surgical History:  Procedure Laterality Date   ABDOMINAL HYSTERECTOMY     with vesicovaginal fistula closure   ANTERIOR CERVICAL DECOMP/DISCECTOMY FUSION N/A 06/11/2022   Procedure: ANTERIOR CERVICAL DISCECTOMY FUSION C3-4, C5-6 WITH ALLOGRAFT, PLATE;  Surgeon: Eldred Manges, MD;  Location: [facility] OR;  Service: Orthopedics;  Laterality: N/A;   ANTERIOR HIP REVISION Left 04/01/2020   Procedure: ANTERIOR LEFT HIP REVISION OF ACETABULAR COMPONENT;  Surgeon: Kathryne Hitch, MD;  Location: [facility] ORS;  Service: Orthopedics;  Laterality: Left;  3E   BLADDER SUSPENSION  2009   sling   BREAST REDUCTION SURGERY     COLONOSCOPY     ESOPHAGOGASTRODUODENOSCOPY     EYE SURGERY     REFRACTIVE SURGERY Right  06/26/2021   REPLACEMENT TOTAL KNEE Left    TOE SURGERY Left    Left great toe   toenail removal Right    all 5 toenails   TOTAL HIP ARTHROPLASTY     TOTAL KNEE ARTHROPLASTY Right 12/09/2013   Procedure: Right Total Knee Arthroplasty;  Surgeon: Nadara Mustard, MD;  Location: [facility] OR;  Service: Orthopedics;  Laterality: Right;   TOTAL KNEE REVISION Left 11/25/2020   Procedure: LEFT TOTAL KNEE REVISION;  Surgeon: Kathryne Hitch, MD;  Location: [facility] ORS;  Service: Orthopedics;  Laterality: Left;    Family History  Problem Relation Age of Onset   Coronary artery disease Father    Skin cancer Father    Heart disease Father    Asthma Brother    Heart disease Brother    Coronary artery disease Brother    Diabetes Other    Stroke Other    Colon cancer Neg Hx    Esophageal cancer Neg Hx    Rectal cancer Neg Hx    Stomach cancer Neg Hx     Allergies  Allergen Reactions   Penicillins Rash    Current Outpatient Medications on File Prior to Visit  Medication Sig Dispense Refill   Acetaminophen (TYLENOL PO) Take by mouth as needed.     aspirin EC 81 MG tablet Take 1 tablet (81 mg total) by mouth 2 (two) times  daily. Swallow whole. (Patient taking differently: Take 81 mg by mouth daily.) 30 tablet 11   brimonidine (ALPHAGAN) 0.2 % ophthalmic solution Place 1 drop into both eyes 2 (two) times daily.     Cholecalciferol (VITAMIN D3) 125 MCG (5000 UT) CHEW Chew 10,000 Units by mouth daily.     Cyanocobalamin (VITAMIN B-12 PO) Take 6,000 mcg by mouth daily. Vitamin B-12 3000 mcg chew     fluticasone (FLONASE) 50 MCG/ACT nasal spray Place 2 sprays into both nostrils daily. 16 g 6   hydroxychloroquine (PLAQUENIL) 200 MG tablet TAKE 1 TABLET BY MOUTH TWICE A DAY 180 tablet 0   latanoprost (XALATAN) 0.005 % ophthalmic solution Place 1 drop into both eyes at bedtime.     losartan (COZAAR) 25 MG tablet TAKE 1 TABLET (25 MG TOTAL) BY MOUTH DAILY. KEEP APPT FOR REFILLS 90 tablet 0   Multiple Vitamins-Minerals (MULTIVITAMIN GUMMIES WOMENS) CHEW Chew 2 each by mouth daily.     Multiple Vitamins-Minerals (PRESERVISION AREDS 2) CAPS Take 1 capsule by mouth 2 (two) times daily.     Omega-3 Fatty Acids (FISH OIL) 1200 MG CAPS Take 2,400 mg by mouth daily.     oxyCODONE-acetaminophen (PERCOCET) 5-325 MG tablet Take 1-2 tablets by mouth every 6 (six) hours as needed for severe pain. 40 tablet 0   Polyethyl Glycol-Propyl Glycol (SYSTANE OP) Place 1 drop into both eyes every 6 (six) hours as needed (dry eyes).     potassium chloride SA (KLOR-CON M20) 20 MEQ tablet Take 1 tablet (20 mEq total) by mouth daily. 90 tablet 1   sertraline (ZOLOFT) 100 MG tablet TAKE 1 TABLET BY MOUTH EVERY DAY IN THE MORNING 90 tablet 0   timolol (TIMOPTIC) 0.5 % ophthalmic solution Place 1 drop into both eyes 2 (two) times daily.     vitamin E 180 MG (400 UNITS) capsule Take 400 Units by mouth daily.     No current facility-administered medications on file prior to visit.    BP 136/82   Pulse 98   Temp 97.6 F (36.4 C) (Oral)   Ht  5' 9.75" (1.772 m)   Wt 266 lb (120.7 kg)   SpO2 98%   BMI 38.44 kg/m       Objective:   Physical  Exam Vitals and nursing note reviewed.  Constitutional:      General: She is not in acute distress.    Appearance: Normal appearance. She is not ill-appearing.  HENT:     Head: Normocephalic and atraumatic.     Right Ear: Tympanic membrane, ear canal and external ear normal. There is no impacted cerumen.     Left Ear: Tympanic membrane, ear canal and external ear normal. There is no impacted cerumen.     Nose: Nose normal. No congestion or rhinorrhea.     Mouth/Throat:     Mouth: Mucous membranes are moist.     Pharynx: Oropharynx is clear.  Eyes:     Extraocular Movements: Extraocular movements intact.     Conjunctiva/sclera: Conjunctivae normal.     Pupils: Pupils are equal, round, and reactive to light.  Neck:     Vascular: No carotid bruit.  Cardiovascular:     Rate and Rhythm: Normal rate and regular rhythm.     Pulses: Normal pulses.     Heart sounds: Normal heart sounds. No murmur heard.    No friction rub. No gallop.  Pulmonary:     Effort: Pulmonary effort is normal.     Breath sounds: Normal breath sounds.  Abdominal:     General: Abdomen is flat. Bowel sounds are normal. There is no distension.     Palpations: Abdomen is soft. There is no mass.     Tenderness: There is no abdominal tenderness. There is no guarding or rebound.     Hernia: No hernia is present.  Musculoskeletal:        General: Normal range of motion.     Cervical back: Normal range of motion and neck supple.     Right lower leg: Edema present.     Left lower leg: Edema present.     Right foot: Deformity present.     Left foot: Deformity present.  Lymphadenopathy:     Cervical: No cervical adenopathy.  Skin:    General: Skin is warm and dry.     Capillary Refill: Capillary refill takes less than 2 seconds.  Neurological:     General: No focal deficit present.     Mental Status: She is alert and oriented to person, place, and time.     Gait: Gait abnormal (uses rolling walker to ambulate).   Psychiatric:        Mood and Affect: Mood normal.        Behavior: Behavior normal.        Thought Content: Thought content normal.        Judgment: Judgment normal.       Assessment & Plan:  1. Routine general medical examination at a health care facility (Primary) Today patient counseled on age appropriate routine health concerns for screening and prevention, each reviewed and up to date or declined. Immunizations reviewed and up to date or declined. Labs ordered and reviewed. Risk factors for depression reviewed and negative. Hearing function and visual acuity are intact. ADLs screened and addressed as needed. Functional ability and level of safety reviewed and appropriate. Education, counseling and referrals performed based on assessed risks today. Patient provided with a copy of personalized plan for preventive services. - Follow up in one year or sooner if needed - Encouraged weight loss through lifestyle  modifications - Needs shingles vaccinations   2. Anxiety  - ALPRAZolam (XANAX) 0.25 MG tablet; Take 1 tablet (0.25 mg total) by mouth 2 (two) times daily as needed for anxiety.  Dispense: 20 tablet; Refill: 2  3. Essential hypertension - Controlled. No change in medication  - furosemide (LASIX) 20 MG tablet; Take 1 tablet (20 mg total) by mouth 2 (two) times daily.  Dispense: 180 tablet; Refill: 3 - Lipid panel; Future - TSH; Future - TSH - Lipid panel  4. Autoimmune disease (HCC) - Per Rheumatology  - Lipid panel; Future - TSH; Future - TSH - Lipid panel  5. OSA (obstructive sleep apnea) - Encouraged to get oral appliance  - Lipid panel; Future - TSH; Future - TSH - Lipid panel  6. Major depressive disorder in full remission, unspecified whether recurrent (HCC) - Continue with Zoloft - Lipid panel; Future - TSH; Future - TSH - Lipid panel  7. Lymphedema - Encouraged to start wearing her leg pumps  - Lipid panel; Future - TSH; Future - TSH - Lipid  panel  8. Hyperlipidemia, unspecified hyperlipidemia type  - rosuvastatin (CRESTOR) 5 MG tablet; TAKE 1 TABLET BY MOUTH EVERYDAY AT BEDTIME  Dispense: 30 tablet; Refill: 0 - Lipid panel; Future - TSH; Future - TSH - Lipid panel  9. Deformity of both feet  - Ambulatory referral to Podiatry  Shirline Frees, NP

## 2023-02-18 ENCOUNTER — Encounter: Payer: Self-pay | Admitting: Podiatry

## 2023-02-18 ENCOUNTER — Ambulatory Visit (INDEPENDENT_AMBULATORY_CARE_PROVIDER_SITE_OTHER): Payer: Medicare HMO | Admitting: Podiatry

## 2023-02-18 DIAGNOSIS — G629 Polyneuropathy, unspecified: Secondary | ICD-10-CM | POA: Diagnosis not present

## 2023-02-18 MED ORDER — GABAPENTIN 300 MG PO CAPS: 300.0000 mg | ORAL_CAPSULE | Freq: Every day | ORAL | 2 refills | Status: DC

## 2023-02-18 NOTE — Progress Notes (Signed)
  Subjective:  Patient ID: Taylor Mccall, female    DOB: 05/10/1945,   MRN: [id number]  No chief complaint on file.   78 y.o. female presents for concern of bilateral hammertoes that have been present for concern of shooting pains in both of her feet that will come and go. Relates some burning and tingling. She is not diabetic. She does have a history of back and cervical problems. She has a history of RA and lymphedema. Relates most pain at night.   . Denies any other pedal complaints. Denies n/v/f/c.   Past Medical History:  Diagnosis Date   Anxiety    Blood transfusion without reported diagnosis    Breast mass, left    Cataract    Depression    Difficult intubation 04/01/2020   Known difficult airway from previous records   Gait disturbance    GERD (gastroesophageal reflux disease)    Hypertension    Lymphedema of both lower extremities    Seeing OT at [facility] - dreeeing to Left Lower leg   Obesity    Rheumatoid arthritis(714.0)    Sleep apnea    cpap   Urinary incontinence    Venous insufficiency     Objective:  Physical Exam: Vascular: DP/PT pulses 2/4 bilateral. CFT <3 seconds. Normal hair growth on digits. No edema.  Skin. No lacerations or abrasions bilateral feet.  Musculoskeletal: MMT 5/5 bilateral lower extremities in DF, PF, Inversion and Eversion. Deceased ROM in DF of ankle joint. Hammered digits 2-5 bilateral. No tenderness to palpation about the foot. Cicatrix from previous bunion and hammertoe surgery. No pain about the toes. Negative tinel.  Neurological: Sensation intact to light touch. Protective sensation diminished.   Assessment:   1. Neuropathy      Plan:  Patient was evaluated and treated and all questions answered. -X-rays reviewed. No acute fractures or dislocations noted. Retained hardware from preivous bunion repair.  Fusion of PIPJ of right second and third digits and left second third and fourth and arthroplasty of  fifth digit bilateral. Hammered digits 2-5 bilateral DIPJ.  Discussed neuropathy vs radiculopathy vs neuritis and etiology as well as treatment with patient.  Radiographs reviewed and discussed with patient.  -Discussed and educated patient on foot care, especially with  regards to the vascular, neurological and musculoskeletal systems.  -Stressed the importance of good glycemic control and the detriment of not  controlling glucose levels in relation to the foot. -Discussed supportive shoes at all times and checking feet regularly.  -Will try course of gabapentin  to see if this helps 300 mg nightly.  -Suggested checking out back to see if this could potentially be related.  -Patient to return in 3 months for follow-up evaluation.    Asberry Failing, DPM

## 2023-04-03 LAB — HM MAMMOGRAPHY

## 2023-04-08 ENCOUNTER — Other Ambulatory Visit: Payer: Self-pay | Admitting: Adult Health

## 2023-04-08 DIAGNOSIS — I10 Essential (primary) hypertension: Secondary | ICD-10-CM

## 2023-04-08 DIAGNOSIS — Z0000 Encounter for general adult medical examination without abnormal findings: Secondary | ICD-10-CM

## 2023-05-20 ENCOUNTER — Ambulatory Visit: Payer: Medicare HMO | Admitting: Podiatry

## 2023-06-12 NOTE — Progress Notes (Signed)
 Office Visit Note  Patient: Taylor Mccall             Date of Birth: December 23, 1945           MRN: [id number]             PCP: Alto Atta, NP Referring: Alto Atta, NP Visit Date: 06/26/2023 Occupation: @GUAROCC @  Subjective:  Medication management  History of Present Illness: Taylor Mccall is a 78 y.o. female with autoimmune disease and osteoarthritis.  She returns today after her last visit in December 2024.  She states she continues to have pain and discomfort in her left shoulder and in her hands.  She has not noticed any joint swelling.  She has some numbness in her hands intermittently.  Her left total hip replacement and both knee replacements are doing well.  She has some discomfort in her neck and lower back.    Activities of Daily Living:  Patient reports morning stiffness for all day.  Patient Reports nocturnal pain.  Difficulty dressing/grooming: Reports Difficulty climbing stairs: Reports Difficulty getting out of chair: Reports Difficulty using hands for taps, buttons, cutlery, and/or writing: Denies  Review of Systems  Constitutional:  Positive for fatigue.  HENT:  Positive for mouth dryness. Negative for mouth sores.   Eyes:  Negative for dryness.  Respiratory:  Negative for shortness of breath.   Cardiovascular:  Positive for swelling in legs/feet. Negative for chest pain and palpitations.  Gastrointestinal:  Negative for blood in stool, constipation and diarrhea.  Endocrine: Negative for increased urination.  Genitourinary:  Negative for involuntary urination.  Musculoskeletal:  Positive for joint pain, gait problem, joint pain, joint swelling, myalgias, morning stiffness and myalgias. Negative for muscle weakness and muscle tenderness.  Skin:  Negative for color change, rash, hair loss and sensitivity to sunlight.  Allergic/Immunologic: Negative for susceptible to infections.  Neurological:  Positive for numbness and headaches. Negative for  dizziness.  Hematological:  Negative for swollen glands.  Psychiatric/Behavioral:  Positive for sleep disturbance. Negative for depressed mood. The patient is not nervous/anxious.     PMFS History:  Patient Active Problem List   Diagnosis Date Noted   Carpal tunnel syndrome, right upper limb 11/02/2022   S/P cervical spinal fusion 06/11/2022   Spinal stenosis of cervical region 05/02/2022   Status post revision of total knee replacement, left 11/25/2020   Failed total knee, left, subsequent encounter 11/24/2020   Status post revision of total hip 04/01/2020   Polyethylene liner wear following total hip arthroplasty requiring isolated polyethylene liner exchange (HCC) 03/31/2020   Intermediate stage nonexudative age-related macular degeneration of right eye 12/23/2019   Exudative age-related macular degeneration of left eye with inactive choroidal neovascularization (HCC) 12/23/2019   Primary open angle glaucoma of both eyes, moderate stage 12/23/2019   Exudative retinopathy of left eye 12/23/2019   Routine general medical examination at a health care facility 12/11/2016   Primary osteoarthritis of left hip 05/01/2016   Autoimmune disease (HCC) 11/29/2015   High risk medication use 11/29/2015   Vitamin D  deficiency 11/29/2015   Macular degeneration 11/29/2015   Bilateral lower extremity edema 10/26/2015   H/O total knee replacement, bilateral 12/09/2013   Breast mass, left 01/18/2011   Obesity 12/08/2009   Venous (peripheral) insufficiency 07/28/2009   PAIN IN JOINT, ANKLE AND FOOT 01/12/2008   Obstructive sleep apnea 08/29/2007   GAIT DISTURBANCE 08/14/2007   Depression 08/26/2006   Essential hypertension 08/26/2006   Primary osteoarthritis of both hands 08/26/2006  Urinary incontinence 08/26/2006    Past Medical History:  Diagnosis Date   Anxiety    Blood transfusion without reported diagnosis    Breast mass, left    Cataract    Depression    Difficult intubation  04/01/2020   Known difficult airway from previous records   Gait disturbance    GERD (gastroesophageal reflux disease)    Hypertension    Lymphedema of both lower extremities    Seeing OT at [facility] - dreeeing to Left Lower leg   Obesity    Rheumatoid arthritis(714.0)    Sleep apnea    cpap   Urinary incontinence    Venous insufficiency     Family History  Problem Relation Age of Onset   Coronary artery disease Father    Skin cancer Father    Heart disease Father    Heart disease Sister    Asthma Brother    Heart disease Brother    Coronary artery disease Brother    Diabetes Other    Stroke Other    Colon cancer Neg Hx    Esophageal cancer Neg Hx    Rectal cancer Neg Hx    Stomach cancer Neg Hx    Past Surgical History:  Procedure Laterality Date   ABDOMINAL HYSTERECTOMY     with vesicovaginal fistula closure   ANTERIOR CERVICAL DECOMP/DISCECTOMY FUSION N/A 06/11/2022   Procedure: ANTERIOR CERVICAL DISCECTOMY FUSION C3-4, C5-6 WITH ALLOGRAFT, PLATE;  Surgeon: Adah Acron, MD;  Location: [facility] OR;  Service: Orthopedics;  Laterality: N/A;   ANTERIOR HIP REVISION Left 04/01/2020   Procedure: ANTERIOR LEFT HIP REVISION OF ACETABULAR COMPONENT;  Surgeon: Arnie Lao, MD;  Location: [facility] ORS;  Service: Orthopedics;  Laterality: Left;  3E   BLADDER SUSPENSION  2009   sling   BREAST REDUCTION SURGERY     COLONOSCOPY     ESOPHAGOGASTRODUODENOSCOPY     EYE SURGERY     REFRACTIVE SURGERY Right 06/26/2021   REPLACEMENT TOTAL KNEE Left    TOE SURGERY Left    Left great toe   toenail removal Right    all 5 toenails   TOTAL HIP ARTHROPLASTY     TOTAL KNEE ARTHROPLASTY Right 12/09/2013   Procedure: Right Total Knee Arthroplasty;  Surgeon: Timothy Ford, MD;  Location: [facility] OR;  Service: Orthopedics;  Laterality: Right;   TOTAL KNEE REVISION Left 11/25/2020   Procedure: LEFT TOTAL KNEE REVISION;  Surgeon: Arnie Lao, MD;  Location: [facility] ORS;   Service: Orthopedics;  Laterality: Left;   Social History   Social History Narrative   Not on file   Immunization History  Administered Date(s) Administered   Fluad Quad(high Dose 65+) 11/03/2021   Influenza Split 12/14/2010, 12/06/2012   Influenza Whole 02/06/2004, 12/08/2009   Influenza, High Dose Seasonal PF 10/26/2015, 12/11/2016, 01/06/2018, 10/07/2018, 11/15/2020, 12/17/2022   Influenza,inj,Quad PF,6+ Mos 10/15/2013, 11/19/2014   Influenza-Unspecified 10/07/2018   PFIZER(Purple Top)SARS-COV-2 Vaccination 03/01/2019, 03/21/2019, 10/06/2019   Pfizer Covid-19 Vaccine Bivalent Booster 70yrs & up 11/15/2020   Pfizer(Comirnaty)Fall Seasonal Vaccine 12 years and older 12/17/2022   Pneumococcal Conjugate-13 03/18/2013   Pneumococcal Polysaccharide-23 06/30/2008, 10/26/2015   Td 02/05/2005   Tdap 12/14/2010, 11/03/2021     Objective: Vital Signs: BP 109/70 (BP Location: Left Arm, Patient Position: Sitting, Cuff Size: Large)   Pulse 76   Resp 15   Ht 5\' 10"  (1.778 m)   Wt 263 lb 9.6 oz (119.6 kg)   BMI 37.82 kg/m  Physical Exam Vitals and nursing note reviewed.  Constitutional:      Appearance: She is well-developed.  HENT:     Head: Normocephalic and atraumatic.  Eyes:     Conjunctiva/sclera: Conjunctivae normal.  Cardiovascular:     Rate and Rhythm: Normal rate and regular rhythm.     Heart sounds: Normal heart sounds.  Pulmonary:     Effort: Pulmonary effort is normal.     Breath sounds: Normal breath sounds.  Abdominal:     General: Bowel sounds are normal.     Palpations: Abdomen is soft.  Musculoskeletal:     Cervical back: Normal range of motion.  Lymphadenopathy:     Cervical: No cervical adenopathy.  Skin:    General: Skin is warm and dry.     Capillary Refill: Capillary refill takes less than 2 seconds.  Neurological:     Mental Status: She is alert and oriented to person, place, and time.  Psychiatric:        Behavior: Behavior normal.       Musculoskeletal Exam: Patient examined in the seated position.  She had limited range of motion of the cervical spine.  Thoracic and lumbar spine could not be assessed.  She had painful limited range of motion of her shoulder joint with abduction to 90 degrees and limited internal rotation.  Right shoulder joint was in good range of motion.  Elbow joints, wrist joints, MCPs with good range of motion.  She is subluxation of some of the DIP joints.  Hip joints could not be assessed.  Knee joints with good range of motion.  There was no tenderness over ankles or MTPs.  CDAI Exam: CDAI Score: -- Patient Global: --; Provider Global: -- Swollen: --; Tender: -- Joint Exam 06/26/2023   No joint exam has been documented for this visit   There is currently no information documented on the homunculus. Go to the Rheumatology activity and complete the homunculus joint exam.  Investigation: No additional findings.  Imaging: No results found.  Recent Labs: Lab Results  Component Value Date   WBC 4.2 01/15/2023   HGB 11.9 01/15/2023   PLT 240 01/15/2023   NA 142 01/15/2023   K 4.0 01/15/2023   CL 105 01/15/2023   CO2 29 01/15/2023   GLUCOSE 76 01/15/2023   BUN 17 01/15/2023   CREATININE 0.83 01/15/2023   BILITOT 0.8 01/15/2023   ALKPHOS 110 09/14/2021   AST 14 01/15/2023   ALT 10 01/15/2023   PROT 7.1 01/15/2023   ALBUMIN 4.2 09/14/2021   CALCIUM  9.6 01/15/2023   GFRAA 98 07/26/2020    Speciality Comments: PLQ eye exam: 03/07/2023  WNL @ [facility] f/u 12 months.  MTX in the past-dcd due to OU, hair loss  Procedures:  Large Joint Inj: L glenohumeral on 06/26/2023 4:06 PM Indications: pain Details: 27 G 1.5 in needle, posterior approach  Arthrogram: No  Medications: 1 mL lidocaine  1 %; 40 mg triamcinolone  acetonide 40 MG/ML Aspirate: 0 mL Outcome: tolerated well, no immediate complications  Risk of infection, tendon injury, nerve injury, hypopigmentation and dermal  atrophy were discussed. Procedure, treatment alternatives, risks and benefits explained, specific risks discussed. Consent was given by the patient. Immediately prior to procedure a time out was called to verify the correct patient, procedure, equipment, support staff and site/side marked as required. Patient was prepped and draped in the usual sterile fashion.     Allergies: Penicillins   Assessment / Plan:  Visit Diagnoses: Autoimmune disease (HCC) - +ANA, +RNP, history of inflammatory arthritis: Patient denies having a flare of autoimmune disease.  There is no history of oral ulcers, nasal ulcers, malar rash, photosensitivity, Raynaud's, inflammatory arthritis.  She continues to have pain and discomfort in her neck and lower back.  She also gives history of intermittent discomfort in her hands.  She has been having increased pain and discomfort in her left shoulder joint.  She states she had cortisone injection in the past which was very helpful.  High risk medication use - Plaquenil  200 mg 1 tablet by mouth twice daily. PLQ eye exam: 03/07/2023 -labs obtained on January 15, 2023 CBC and CMP were normal.  Double-stranded DNA was negative, complements normal and sed rate normal.  I discussed decreasing the dose of Plaquenil  to 200 mg p.o. daily.  Patient is hesitant to reduce the dose at this point.  She is also not wanting to have repeat labs as she states her co-pay is expensive.  Will check only CBC with differential and CMP with GFR today.  Plan: CBC with Differential/Platelet, Comprehensive metabolic panel with GFR  Chronic left shoulder pain-she has been having increased pain and discomfort in left shoulder joint.  She had limited range of motion with discomfort.  After informed consent was obtained left shoulder joint was injected with lidocaine  and Kenalog  as described above.  Patient tolerated the procedure well.  Postprocedure instructions were given.  A handout on shoulder exercises was  given.  Primary osteoarthritis of both hands-she had bilateral PIP and DIP thickening.  Joint protection was discussed.  Carpal tunnel syndrome, right upper limb-she gives history of intermittent numbness in her hands.  S/P total left hip arthroplasty - Revision February 2022 by Dr. Lucienne Ryder.  Doing well.  H/O total knee replacement, bilateral-she had good range of motion of bilateral knee joints.  DDD (degenerative disc disease), cervical -she had limited range of motion of the cervical spine with chronic discomfort.  A handout on neck exercises was given.  Severe multilevel spinal stenosis>>s/p anterior cervical fusion-performed by Dr. Murrel Arnt on 06/11/22.  Degeneration of intervertebral disc of lumbar region without discogenic back pain or lower extremity pain-she continues to have some lower back pain.  A handout on back exercises was given.  Vitamin D  deficiency-she has been taking vitamin D .  Other medical problems are listed as follows:  Lymphedema  History of glaucoma  History of macular degeneration  History of depression  History of sleep apnea  Orders: Orders Placed This Encounter  Procedures   CBC with Differential/Platelet   Comprehensive metabolic panel with GFR   No orders of the defined types were placed in this encounter.   Follow-Up Instructions: Return in about 5 months (around 11/26/2023) for Autoimmune disease.   Nicholas Bari, MD  Note - This record has been created using Animal nutritionist.  Chart creation errors have been sought, but may not always  have been located. Such creation errors do not reflect on  the standard of medical care.

## 2023-06-26 ENCOUNTER — Encounter: Payer: Self-pay | Admitting: Rheumatology

## 2023-06-26 ENCOUNTER — Ambulatory Visit: Payer: Medicare HMO | Attending: Rheumatology | Admitting: Rheumatology

## 2023-06-26 VITALS — BP 109/70 | HR 76 | Resp 15 | Ht 70.0 in | Wt 263.6 lb

## 2023-06-26 DIAGNOSIS — Z96642 Presence of left artificial hip joint: Secondary | ICD-10-CM

## 2023-06-26 DIAGNOSIS — Z96653 Presence of artificial knee joint, bilateral: Secondary | ICD-10-CM

## 2023-06-26 DIAGNOSIS — M51369 Other intervertebral disc degeneration, lumbar region without mention of lumbar back pain or lower extremity pain: Secondary | ICD-10-CM

## 2023-06-26 DIAGNOSIS — M359 Systemic involvement of connective tissue, unspecified: Secondary | ICD-10-CM

## 2023-06-26 DIAGNOSIS — E559 Vitamin D deficiency, unspecified: Secondary | ICD-10-CM

## 2023-06-26 DIAGNOSIS — M25512 Pain in left shoulder: Secondary | ICD-10-CM

## 2023-06-26 DIAGNOSIS — G8929 Other chronic pain: Secondary | ICD-10-CM | POA: Diagnosis not present

## 2023-06-26 DIAGNOSIS — M19041 Primary osteoarthritis, right hand: Secondary | ICD-10-CM

## 2023-06-26 DIAGNOSIS — I890 Lymphedema, not elsewhere classified: Secondary | ICD-10-CM

## 2023-06-26 DIAGNOSIS — Z79899 Other long term (current) drug therapy: Secondary | ICD-10-CM

## 2023-06-26 DIAGNOSIS — G5601 Carpal tunnel syndrome, right upper limb: Secondary | ICD-10-CM

## 2023-06-26 DIAGNOSIS — Z8669 Personal history of other diseases of the nervous system and sense organs: Secondary | ICD-10-CM

## 2023-06-26 DIAGNOSIS — M5030 Other cervical disc degeneration, unspecified cervical region: Secondary | ICD-10-CM

## 2023-06-26 DIAGNOSIS — Z8659 Personal history of other mental and behavioral disorders: Secondary | ICD-10-CM

## 2023-06-26 DIAGNOSIS — M19042 Primary osteoarthritis, left hand: Secondary | ICD-10-CM

## 2023-06-26 MED ORDER — LIDOCAINE HCL 1 % IJ SOLN
1.0000 mL | INTRAMUSCULAR | Status: AC | PRN
Start: 2023-06-26 — End: 2023-06-26
  Administered 2023-06-26: 1 mL

## 2023-06-26 MED ORDER — TRIAMCINOLONE ACETONIDE 40 MG/ML IJ SUSP
40.0000 mg | INTRAMUSCULAR | Status: AC | PRN
Start: 2023-06-26 — End: 2023-06-26
  Administered 2023-06-26: 40 mg via INTRA_ARTICULAR

## 2023-06-26 NOTE — Patient Instructions (Addendum)
 Vaccines You are taking a medication(s) that can suppress your immune system.  The following immunizations are recommended: Flu annually Covid-19  Td/Tdap (tetanus, diphtheria, pertussis) every 10 years Pneumonia (Prevnar 15 then Pneumovax 23 at least 1 year apart.  Alternatively, can take Prevnar 20 without needing additional dose) Shingrix: 2 doses from 4 weeks to 6 months apart  Please check with your PCP to make sure you are up to date.   Low Back Sprain or Strain Rehab Ask your health care provider which exercises are safe for you. Do exercises exactly as told by your health care provider and adjust them as directed. It is normal to feel mild stretching, pulling, tightness, or discomfort as you do these exercises. Stop right away if you feel sudden pain or your pain gets worse. Do not begin these exercises until told by your health care provider. Stretching and range-of-motion exercises These exercises warm up your muscles and joints and improve the movement and flexibility of your back. These exercises also help to relieve pain, numbness, and tingling. Lumbar rotation  Lie on your back on a firm bed or the floor with your knees bent. Straighten your arms out to your sides so each arm forms a 90-degree angle (right angle) with a side of your body. Slowly move (rotate) both of your knees to one side of your body until you feel a stretch in your lower back (lumbar). Try not to let your shoulders lift off the floor. Hold this position for __________ seconds. Tense your abdominal muscles and slowly move your knees back to the starting position. Repeat this exercise on the other side of your body. Repeat __________ times. Complete this exercise __________ times a day. Single knee to chest  Lie on your back on a firm bed or the floor with both legs straight. Bend one of your knees. Use your hands to move your knee up toward your chest until you feel a gentle stretch in your lower back and  buttock. Hold your leg in this position by holding on to the front of your knee. Keep your other leg as straight as possible. Hold this position for __________ seconds. Slowly return to the starting position. Repeat with your other leg. Repeat __________ times. Complete this exercise __________ times a day. Prone extension on elbows  Lie on your abdomen on a firm bed or the floor (prone position). Prop yourself up on your elbows. Use your arms to help lift your chest up until you feel a gentle stretch in your abdomen and your lower back. This will place some of your body weight on your elbows. If this is uncomfortable, try stacking pillows under your chest. Your hips should stay down, against the surface that you are lying on. Keep your hip and back muscles relaxed. Hold this position for __________ seconds. Slowly relax your upper body and return to the starting position. Repeat __________ times. Complete this exercise __________ times a day. Strengthening exercises These exercises build strength and endurance in your back. Endurance is the ability to use your muscles for a long time, even after they get tired. Pelvic tilt This exercise strengthens the muscles that lie deep in the abdomen. Lie on your back on a firm bed or the floor with your legs extended. Bend your knees so they are pointing toward the ceiling and your feet are flat on the floor. Tighten your lower abdominal muscles to press your lower back against the floor. This motion will tilt your pelvis so  your tailbone points up toward the ceiling instead of pointing to your feet or the floor. To help with this exercise, you may place a small towel under your lower back and try to push your back into the towel. Hold this position for __________ seconds. Let your muscles relax completely before you repeat this exercise. Repeat __________ times. Complete this exercise __________ times a day. Alternating arm and leg raises  Get  on your hands and knees on a firm surface. If you are on a hard floor, you may want to use padding, such as an exercise mat, to cushion your knees. Line up your arms and legs. Your hands should be directly below your shoulders, and your knees should be directly below your hips. Lift your left leg behind you. At the same time, raise your right arm and straighten it in front of you. Do not lift your leg higher than your hip. Do not lift your arm higher than your shoulder. Keep your abdominal and back muscles tight. Keep your hips facing the ground. Do not arch your back. Keep your balance carefully, and do not hold your breath. Hold this position for __________ seconds. Slowly return to the starting position. Repeat with your right leg and your left arm. Repeat __________ times. Complete this exercise __________ times a day. Abdominal set with straight leg raise  Lie on your back on a firm bed or the floor. Bend one of your knees and keep your other leg straight. Tense your abdominal muscles and lift your straight leg up, 4-6 inches (10-15 cm) off the ground. Keep your abdominal muscles tight and hold this position for __________ seconds. Do not hold your breath. Do not arch your back. Keep it flat against the ground. Keep your abdominal muscles tense as you slowly lower your leg back to the starting position. Repeat with your other leg. Repeat __________ times. Complete this exercise __________ times a day. Single leg lower with bent knees Lie on your back on a firm bed or the floor. Tense your abdominal muscles and lift your feet off the floor, one foot at a time, so your knees and hips are bent in 90-degree angles (right angles). Your knees should be over your hips and your lower legs should be parallel to the floor. Keeping your abdominal muscles tense and your knee bent, slowly lower one of your legs so your toe touches the ground. Lift your leg back up to return to the starting  position. Do not hold your breath. Do not let your back arch. Keep your back flat against the ground. Repeat with your other leg. Repeat __________ times. Complete this exercise __________ times a day. Posture and body mechanics Good posture and healthy body mechanics can help to relieve stress in your body's tissues and joints. Body mechanics refers to the movements and positions of your body while you do your daily activities. Posture is part of body mechanics. Good posture means: Your spine is in its natural S-curve position (neutral). Your shoulders are pulled back slightly. Your head is not tipped forward (neutral). Follow these guidelines to improve your posture and body mechanics in your everyday activities. Standing  When standing, keep your spine neutral and your feet about hip-width apart. Keep a slight bend in your knees. Your ears, shoulders, and hips should line up. When you do a task in which you stand in one place for a long time, place one foot up on a stable object that is 2-4 inches (5-10  cm) high, such as a footstool. This helps keep your spine neutral. Sitting  When sitting, keep your spine neutral and keep your feet flat on the floor. Use a footrest, if necessary, and keep your thighs parallel to the floor. Avoid rounding your shoulders, and avoid tilting your head forward. When working at a desk or a computer, keep your desk at a height where your hands are slightly lower than your elbows. Slide your chair under your desk so you are close enough to maintain good posture. When working at a computer, place your monitor at a height where you are looking straight ahead and you do not have to tilt your head forward or downward to look at the screen. Resting When lying down and resting, avoid positions that are most painful for you. If you have pain with activities such as sitting, bending, stooping, or squatting, lie in a position in which your body does not bend very much. For  example, avoid curling up on your side with your arms and knees near your chest (fetal position). If you have pain with activities such as standing for a long time or reaching with your arms, lie with your spine in a neutral position and bend your knees slightly. Try the following positions: Lying on your side with a pillow between your knees. Lying on your back with a pillow under your knees. Lifting  When lifting objects, keep your feet at least shoulder-width apart and tighten your abdominal muscles. Bend your knees and hips and keep your spine neutral. It is important to lift using the strength of your legs, not your back. Do not lock your knees straight out. Always ask for help to lift heavy or awkward objects. This information is not intended to replace advice given to you by your health care provider. Make sure you discuss any questions you have with your health care provider. Document Revised: 05/28/2022 Document Reviewed: 04/11/2020 [employer] Patient Education  2024 [employer] Cervical Strain and Sprain Rehab Ask your health care provider which exercises are safe for you. Do exercises exactly as told by your health care provider and adjust them as directed. It is normal to feel mild stretching, pulling, tightness, or discomfort as you do these exercises. Stop right away if you feel sudden pain or your pain gets worse. Do not begin these exercises until told by your health care provider. Stretching and range-of-motion exercises Cervical side bending  Using good posture, sit on a stable chair or stand up. Without moving your shoulders, slowly tilt your left / right ear to your shoulder until you feel a stretch in the neck muscles on the opposite side. You should be looking straight ahead. Hold for __________ seconds. Repeat with the other side of your neck. Repeat __________ times. Complete this exercise __________ times a day. Cervical rotation  Using good posture, sit on a stable  chair or stand up. Slowly turn your head to the side as if you are looking over your left / right shoulder. Keep your eyes level with the ground. Stop when you feel a stretch along the side and the back of your neck. Hold for __________ seconds. Repeat this by turning to your other side. Repeat __________ times. Complete this exercise __________ times a day. Thoracic extension and pectoral stretch  Roll a towel or a small blanket so it is about 4 inches (10 cm) in diameter. Lie down on your back on a firm surface. Put the towel in the middle of your  back across your spine. It should not be under your shoulder blades. Put your hands behind your head and let your elbows fall out to your sides. Hold for __________ seconds. Repeat __________ times. Complete this exercise __________ times a day. Strengthening exercises Upper cervical flexion  Lie on your back with a thin pillow behind your head or a small, rolled-up towel under your neck. Gently tuck your chin toward your chest and nod your head down to look toward your feet. Do not lift your head off the pillow. Hold for __________ seconds. Release the tension slowly. Relax your neck muscles completely before you repeat this exercise. Repeat __________ times. Complete this exercise __________ times a day. Cervical extension  Stand about 6 inches (15 cm) away from a wall, with your back facing the wall. Place a soft object, about 6-8 inches (15-20 cm) in diameter, between the back of your head and the wall. A soft object could be a small pillow, a ball, or a folded towel. Gently tilt your head back and press into the soft object. Keep your jaw and forehead relaxed. Hold for __________ seconds. Release the tension slowly. Relax your neck muscles completely before you repeat this exercise. Repeat __________ times. Complete this exercise __________ times a day. Posture and body mechanics Body mechanics refer to the movements and positions of  your body while you do your daily activities. Posture is part of body mechanics. Good posture and healthy body mechanics can help to relieve stress in your body's tissues and joints. Good posture means that your spine is in its natural S-curve position (your spine is neutral), your shoulders are pulled back slightly, and your head is not tipped forward. The following are general guidelines for using improved posture and body mechanics in your everyday activities. Sitting  When sitting, keep your spine neutral and keep your feet flat on the floor. Use a footrest, if needed, and keep your thighs parallel to the floor. Avoid rounding your shoulders. Avoid tilting your head forward. When working at a desk or a computer, keep your desk at a height where your hands are slightly lower than your elbows. Slide your chair under your desk so you are close enough to maintain good posture. When working at a computer, place your monitor at a height where you are looking straight ahead and you do not have to tilt your head forward or downward to look at the screen. Standing  When standing, keep your spine neutral and keep your feet about hip-width apart. Keep a slight bend in your knees. Your ears, shoulders, and hips should line up. When you do a task in which you stand in one place for a long time, place one foot up on a stable object that is 2-4 inches (5-10 cm) high, such as a footstool. This helps keep your spine neutral. Resting When lying down and resting, avoid positions that are most painful for you. Try to support your neck in a neutral position. You can use a contour pillow or a small rolled-up towel. Your pillow should support your neck but not push on it. This information is not intended to replace advice given to you by your health care provider. Make sure you discuss any questions you have with your health care provider. Document Revised: 05/28/2022 Document Reviewed: 08/14/2021 [employer] Patient  Education  2024 [employer] Shoulder Exercises Ask your health care provider which exercises are safe for you. Do exercises exactly as told by your health care provider and  adjust them as directed. It is normal to feel mild stretching, pulling, tightness, or discomfort as you do these exercises. Stop right away if you feel sudden pain or your pain gets worse. Do not begin these exercises until told by your health care provider. Stretching exercises External rotation and abduction This exercise is sometimes called corner stretch. The exercise rotates your arm outward (external rotation) and moves your arm out from your body (abduction). Stand in a doorway with one of your feet slightly in front of the other. This is called a staggered stance. If you cannot reach your forearms to the door frame, stand facing a corner of a room. Choose one of the following positions as told by your health care provider: Place your hands and forearms on the door frame above your head. Place your hands and forearms on the door frame at the height of your head. Place your hands on the door frame at the height of your elbows. Slowly move your weight onto your front foot until you feel a stretch across your chest and in the front of your shoulders. Keep your head and chest upright and keep your abdominal muscles tight. Hold for __________ seconds. To release the stretch, shift your weight to your back foot. Repeat __________ times. Complete this exercise __________ times a day. Extension, standing  Stand and hold a broomstick, a cane, or a similar object behind your back. Your hands should be a little wider than shoulder-width apart. Your palms should face away from your back. Keeping your elbows straight and your shoulder muscles relaxed, move the stick away from your body until you feel a stretch in your shoulders (extension). Avoid shrugging your shoulders while you move the stick. Keep your shoulder blades tucked  down toward the middle of your back. Hold for __________ seconds. Slowly return to the starting position. Repeat __________ times. Complete this exercise __________ times a day. Range-of-motion exercises Pendulum  Stand near a wall or a surface that you can hold onto for balance. Bend at the waist and let your left / right arm hang straight down. Use your other arm to support you. Keep your back straight and do not lock your knees. Relax your left / right arm and shoulder muscles, and move your hips and your trunk so your left / right arm swings freely. Your arm should swing because of the motion of your body, not because you are using your arm or shoulder muscles. Keep moving your hips and trunk so your arm swings in the following directions, as told by your health care provider: Side to side. Forward and backward. In clockwise and counterclockwise circles. Continue each motion for __________ seconds, or for as long as told by your health care provider. Slowly return to the starting position. Repeat __________ times. Complete this exercise __________ times a day. Shoulder flexion, standing  Stand and hold a broomstick, a cane, or a similar object. Place your hands a little more than shoulder-width apart on the object. Your left / right hand should be palm-up, and your other hand should be palm-down. Keep your elbow straight and your shoulder muscles relaxed. Push the stick up with your healthy arm to raise your left / right arm in front of your body, and then over your head until you feel a stretch in your shoulder (flexion). Avoid shrugging your shoulder while you raise your arm. Keep your shoulder blade tucked down toward the middle of your back. Hold for __________ seconds. Slowly return to  the starting position. Repeat __________ times. Complete this exercise __________ times a day. Shoulder abduction, standing  Stand and hold a broomstick, a cane, or a similar object. Place your  hands a little more than shoulder-width apart on the object. Your left / right hand should be palm-up, and your other hand should be palm-down. Keep your elbow straight and your shoulder muscles relaxed. Push the object across your body toward your left / right side. Raise your left / right arm to the side of your body (abduction) until you feel a stretch in your shoulder. Do not raise your arm above shoulder height unless your health care provider tells you to do that. If directed, raise your arm over your head. Avoid shrugging your shoulder while you raise your arm. Keep your shoulder blade tucked down toward the middle of your back. Hold for __________ seconds. Slowly return to the starting position. Repeat __________ times. Complete this exercise __________ times a day. Internal rotation  Place your left / right hand behind your back, palm-up. Use your other hand to dangle an exercise band, a broomstick, or a similar object over your shoulder. Grasp the band with your left / right hand so you are holding on to both ends. Gently pull up on the band until you feel a stretch in the front of your left / right shoulder. The movement of your arm toward the center of your body is called internal rotation. Avoid shrugging your shoulder while you raise your arm. Keep your shoulder blade tucked down toward the middle of your back. Hold for __________ seconds. Release the stretch by letting go of the band and lowering your hands. Repeat __________ times. Complete this exercise __________ times a day. Strengthening exercises External rotation  Sit in a stable chair without armrests. Secure an exercise band to a stable object at elbow height on your left / right side. Place a soft object, such as a folded towel or a small pillow, between your left / right upper arm and your body to move your elbow about 4 inches (10 cm) away from your side. Hold the end of the exercise band so it is tight and there is  no slack. Keeping your elbow pressed against the soft object, slowly move your forearm out, away from your abdomen (external rotation). Keep your body steady so only your forearm moves. Hold for __________ seconds. Slowly return to the starting position. Repeat __________ times. Complete this exercise __________ times a day. Shoulder abduction  Sit in a stable chair without armrests, or stand up. Hold a __________ lb / kg weight in your left / right hand, or hold an exercise band with both hands. Start with your arms straight down and your left / right palm facing in, toward your body. Slowly lift your left / right hand out to your side (abduction). Do not lift your hand above shoulder height unless your health care provider tells you that this is safe. Keep your arms straight. Avoid shrugging your shoulder while you do this movement. Keep your shoulder blade tucked down toward the middle of your back. Hold for __________ seconds. Slowly lower your arm, and return to the starting position. Repeat __________ times. Complete this exercise __________ times a day. Shoulder extension  Sit in a stable chair without armrests, or stand up. Secure an exercise band to a stable object in front of you so it is at shoulder height. Hold one end of the exercise band in each hand. Straighten your  elbows and lift your hands up to shoulder height. Squeeze your shoulder blades together as you pull your hands down to the sides of your thighs (extension). Stop when your hands are straight down by your sides. Do not let your hands go behind your body. Hold for __________ seconds. Slowly return to the starting position. Repeat __________ times. Complete this exercise __________ times a day. Shoulder row  Sit in a stable chair without armrests, or stand up. Secure an exercise band to a stable object in front of you so it is at chest height. Hold one end of the exercise band in each hand. Position your palms so  that your thumbs are facing the ceiling (neutral position). Bend each of your elbows to a 90-degree angle (right angle) and keep your upper arms at your sides. Step back or move the chair back until the band is tight and there is no slack. Slowly pull your elbows back behind you. Hold for __________ seconds. Slowly return to the starting position. Repeat __________ times. Complete this exercise __________ times a day. Shoulder press-ups  Sit in a stable chair that has armrests. Sit upright, with your feet flat on the floor. Put your hands on the armrests so your elbows are bent and your fingers are pointing forward. Your hands should be about even with the sides of your body. Push down on the armrests and use your arms to lift yourself off the chair. Straighten your elbows and lift yourself up as much as you comfortably can. Move your shoulder blades down, and avoid letting your shoulders move up toward your ears. Keep your feet on the ground. As you get stronger, your feet should support less of your body weight as you lift yourself up. Hold for __________ seconds. Slowly lower yourself back into the chair. Repeat __________ times. Complete this exercise __________ times a day. Wall push-ups  Stand so you are facing a stable wall. Your feet should be about one arm-length away from the wall. Lean forward and place your palms on the wall at shoulder height. Keep your feet flat on the floor as you bend your elbows and lean forward toward the wall. Hold for __________ seconds. Straighten your elbows to push yourself back to the starting position. Repeat __________ times. Complete this exercise __________ times a day. This information is not intended to replace advice given to you by your health care provider. Make sure you discuss any questions you have with your health care provider. Document Revised: 03/14/2021 Document Reviewed: 03/14/2021 [employer] Patient Education  2024 [employer].

## 2023-06-27 ENCOUNTER — Ambulatory Visit: Payer: Self-pay | Admitting: Rheumatology

## 2023-06-27 LAB — COMPREHENSIVE METABOLIC PANEL WITH GFR
AG Ratio: 1.6 (calc) (ref 1.0–2.5)
ALT: 13 U/L (ref 6–29)
AST: 17 U/L (ref 10–35)
Albumin: 4.2 g/dL (ref 3.6–5.1)
Alkaline phosphatase (APISO): 106 U/L (ref 37–153)
BUN: 17 mg/dL (ref 7–25)
CO2: 29 mmol/L (ref 20–32)
Calcium: 9.7 mg/dL (ref 8.6–10.4)
Chloride: 103 mmol/L (ref 98–110)
Creat: 0.95 mg/dL (ref 0.60–1.00)
Globulin: 2.7 g/dL (ref 1.9–3.7)
Glucose, Bld: 77 mg/dL (ref 65–99)
Potassium: 4 mmol/L (ref 3.5–5.3)
Sodium: 140 mmol/L (ref 135–146)
Total Bilirubin: 0.9 mg/dL (ref 0.2–1.2)
Total Protein: 6.9 g/dL (ref 6.1–8.1)
eGFR: 61 mL/min/{1.73_m2} (ref >=60)

## 2023-06-27 LAB — CBC WITH DIFFERENTIAL/PLATELET
Absolute Lymphocytes: 1481 {cells}/uL (ref 850–3900)
Absolute Monocytes: 404 {cells}/uL (ref 200–950)
Basophils Absolute: 61 {cells}/uL (ref 0–200)
Basophils Relative: 1.3 %
Eosinophils Absolute: 362 {cells}/uL (ref 15–500)
Eosinophils Relative: 7.7 %
HCT: 37.8 % (ref 35.0–45.0)
Hemoglobin: 12.1 g/dL (ref 11.7–15.5)
MCH: 29.3 pg (ref 27.0–33.0)
MCHC: 32 g/dL (ref 32.0–36.0)
MCV: 91.5 fL (ref 80.0–100.0)
MPV: 11.5 fL (ref 7.5–12.5)
Monocytes Relative: 8.6 %
Neutro Abs: 2392 {cells}/uL (ref 1500–7800)
Neutrophils Relative %: 50.9 %
Platelets: 234 10*3/uL (ref 140–400)
RBC: 4.13 10*6/uL (ref 3.80–5.10)
RDW: 12 % (ref 11.0–15.0)
Total Lymphocyte: 31.5 %
WBC: 4.7 10*3/uL (ref 3.8–10.8)

## 2023-06-27 NOTE — Progress Notes (Signed)
 CBC and CMP are normal.

## 2023-07-01 ENCOUNTER — Other Ambulatory Visit: Payer: Self-pay | Admitting: Physician Assistant

## 2023-07-01 DIAGNOSIS — M359 Systemic involvement of connective tissue, unspecified: Secondary | ICD-10-CM

## 2023-07-02 NOTE — Telephone Encounter (Signed)
 Last Fill: 09/18/2022  Eye exam: 03/07/2023  WNL   Labs: 06/26/2023 CBC and CMP are normal.   Next Visit: 11/27/2023  Last Visit: 06/26/2023  [id number] disease   Current Dose per office note 06/26/2023: Plaquenil  200 mg 1 tablet by mouth twice daily   Okay to refill Plaquenil ?

## 2023-07-06 ENCOUNTER — Other Ambulatory Visit: Payer: Self-pay | Admitting: Adult Health

## 2023-07-06 DIAGNOSIS — Z0000 Encounter for general adult medical examination without abnormal findings: Secondary | ICD-10-CM

## 2023-07-06 DIAGNOSIS — I10 Essential (primary) hypertension: Secondary | ICD-10-CM

## 2023-07-16 ENCOUNTER — Ambulatory Visit: Admitting: Family Medicine

## 2023-07-23 ENCOUNTER — Encounter: Payer: Self-pay | Admitting: Family Medicine

## 2023-07-23 ENCOUNTER — Ambulatory Visit (INDEPENDENT_AMBULATORY_CARE_PROVIDER_SITE_OTHER): Admitting: Family Medicine

## 2023-07-23 DIAGNOSIS — Z0000 Encounter for general adult medical examination without abnormal findings: Secondary | ICD-10-CM | POA: Diagnosis not present

## 2023-07-23 NOTE — Patient Instructions (Signed)
 I really enjoyed getting to talk with you today! I am available on Tuesdays and Thursdays for virtual visits if you have any questions or concerns, or if I can be of any further assistance.   CHECKLIST FROM ANNUAL WELLNESS VISIT:  -Follow up (please call to schedule if not scheduled after visit):   -yearly for annual wellness visit with primary care office  Here is a list of your preventive care/health maintenance measures and the plan for each if any are due:  PLAN For any measures below that may be due:    1.) Can get vaccines at the pharmacy. Please bring receipt when you do so that we can keep your immunization record up to date. Thank you.   Health Maintenance  Topic Date Due   COVID-19 Vaccine (6 - Pfizer risk 2024-25 season) 08/08/2023 (Originally 06/16/2023)   Zoster Vaccines- Shingrix (1 of 2) 10/23/2023 (Originally 10/06/1964)   INFLUENZA VACCINE  09/06/2023   Medicare Annual Wellness (AWV)  07/22/2024   DTaP/Tdap/Td (4 - Td or Tdap) 11/04/2031   Pneumococcal Vaccine: 50+ Years  Completed   DEXA SCAN  Completed   Hepatitis C Screening  Completed   HPV VACCINES  Aged Out   Meningococcal B Vaccine  Aged Out   Colonoscopy  Discontinued    -See a dentist at least yearly  -Get your eyes checked and then per your eye specialist's recommendations  -Other issues addressed today:   -I have included below further information regarding a healthy whole foods based diet, physical activity guidelines for adults, stress management and opportunities for social connections. I hope you find this information useful.   -----------------------------------------------------------------------------------------------------------------------------------------------------------------------------------------------------------------------------------------------------------    NUTRITION: -eat real food: lots of colorful vegetables (half the plate) and fruits -5-7 servings of vegetables and  fruits per day (fresh or steamed is best), exp. 2 servings of vegetables with lunch and dinner and 2 servings of fruit per day. Berries and greens such as kale and collards are great choices.  -consume on a regular basis:  fresh fruits, fresh veggies, fish, nuts, seeds, healthy oils (such as olive oil, avocado oil), whole grains (make sure for bread/pasta/crackers/etc., that the first ingredient on label contains the word whole), legumes. -can eat small amounts of dairy and lean meat (no larger than the palm of your hand), but avoid processed meats such as ham, bacon, lunch meat, etc. -drink water  -try to avoid fast food and pre-packaged foods, processed meat, ultra processed foods/beverages (donuts, candy, etc.) -most experts advise limiting sodium to < 2300mg  per day, should limit further is any chronic conditions such as high blood pressure, heart disease, diabetes, etc. The American Heart Association advised that < 1500mg  is is ideal -try to avoid foods/beverages that contain any ingredients with names you do not recognize  -try to avoid foods/beverages  with added sugar or sweeteners/sweets  -try to avoid sweet drinks (including diet drinks): soda, juice, Gatorade, sweet tea, power drinks, diet drinks -try to avoid white rice, white bread, pasta (unless whole grain)  EXERCISE GUIDELINES FOR ADULTS: -if you wish to increase your physical activity, do so gradually and with the approval of your doctor -STOP and seek medical care immediately if you have any chest pain, chest discomfort or trouble breathing when starting or increasing exercise  -move and stretch your body, legs, feet and arms when sitting for long periods -Physical activity guidelines for optimal health in adults: -get at least 150 minutes per week of moderate exercise (can talk, but not sing); this  is about 20-30 minutes of sustained activity 5-7 days per week or two 10-15 minute episodes of sustained activity 5-7 days per  week -do some muscle building/resistance training/strength training at least 2 days per week  -balance exercises 3+ days per week:   Stand somewhere where you have something sturdy to hold onto if you lose balance    1) lift up on toes, then back down, start with 5x per day and work up to 20x   2) stand and lift one leg straight out to the side so that foot is a few inches of the floor, start with 5x each side and work up to 20x each side   3) stand on one foot, start with 5 seconds each side and work up to 20 seconds on each side  If you need ideas or help with getting more active:  -Silver sneakers https://tools.silversneakers.com  -Walk with a Doc: http://www.duncan-Privitera.com/  -try to include resistance (weight lifting/strength building) and balance exercises twice per week: or the following link for ideas: http://castillo-powell.com/  BuyDucts.dk  STRESS MANAGEMENT: -can try meditating, or just sitting quietly with deep breathing while intentionally relaxing all parts of your body for 5 minutes daily -if you need further help with stress, anxiety or depression please follow up with your primary doctor or contact the wonderful folks at [facility]: [phone]  SOCIAL CONNECTIONS: -options in [place] if you wish to engage in more social and exercise related activities:  -Silver sneakers https://tools.silversneakers.com  -Walk with a Doc: http://www.duncan-Mccreedy.com/  -Check out the [facility] Active Adults 50+ section on the [place] [employer] (hiking clubs, book clubs, cards and games, chess, exercise classes, aquatic classes and much more) - see the website for details: https://www.[place].gov/departments/parks-recreation/active-adults50  -YouTube has lots of exercise videos for different ages and abilities as well  -[facility] (a variety of  indoor and outdoor inperson activities for adults). 6161621468. [address]  -Virtual Online Classes (a variety of topics): see seniorplanet.org or call [phone]  -consider volunteering at a school, hospice center, church, senior center or elsewhere

## 2023-07-23 NOTE — Progress Notes (Signed)
 PATIENT CHECK-IN and HEALTH RISK ASSESSMENT QUESTIONNAIRE:  -completed by phone/video for upcoming Medicare Preventive Visit  Pre-Visit Check-in: 1)Vitals (height, wt, BP, etc) - record in vitals section for visit on day of visit Request home vitals (wt, BP, etc.) and enter into vitals, THEN update Vital Signs SmartPhrase below at the top of the HPI. See below.  2)Review and Update Medications, Allergies PMH, Surgeries, Social history in Epic 3)Hospitalizations in the last year with date/reason? Yes,Orthopedic  surgery, in May 2024 4)Review and Update Care Team (patient's specialists) in Epic 5) Complete PHQ9 in Epic  6) Complete Fall Screening in Epic 7)Review all Health Maintenance Due and order under PCP if not done.  Medicare Wellness Patient Questionnaire:  Answer theses question about your habits: How often do you have a drink containing alcohol ?NO  How many drinks containing alcohol  do you have on a typical day when you are drinking?NA  How often do you have six or more drinks on one occasion?Na  Have you ever smoked?yes Quit date if applicable? 45 years ago   How many packs a day do/did you smoke? Less than a pack  Do you use smokeless tobacco?NO  Do you use an illicit drugs?No  On average, how many days per week do you engage in moderate to strenuous exercise (like a brisk walk)?No  On average, how many minutes do you engage in exercise at this level?Na  Are you sexually active? NO Number of partners?NA  Typical breakfast: Smoothie or yogurt  Typical lunch:NONE  Typical dinner:veggies, [place]  Typical snacks:CHIPS candy   Beverages: Coke, water   Family comes over at least once a week. Daughter lives with her. She also does bibles study and church virtually.    Answer theses question about your everyday activities: Can you perform most household chores?Yes  Are you deaf or have significant trouble hearing?yes, patient wears earring aids  Do you feel that you have a  problem with memory?yes Do you feel safe at home?Yes  Last dentist visit?07/22/2023 8. Do you have any difficulty performing your everyday activities?NO  Are you having any difficulty walking, taking medications on your own, and or difficulty managing daily home needs?NO  Do you have difficulty walking or climbing stairs?NO, stair lift  Do you have difficulty dressing or bathing?NO  Do you have difficulty doing errands alone such as visiting a doctor's office or shopping?Yes, not driving  Do you currently have any difficulty preparing food and eating?NO  Do you currently have any difficulty using the toilet?NO  Do you have any difficulty managing your finances?NO  Do you have any difficulties with housekeeping of managing your housekeeping?NO    Do you have Advanced Directives in place (Living Will, Healthcare Power or Attorney)? Yes    Last eye Exam and location?Feb 2025   Do you currently use prescribed or non-prescribed narcotic or opioid pain medications?no   Do you have a history or close family history of breast, ovarian, tubal or peritoneal cancer or a family member with BRCA (breast cancer susceptibility 1 and 2) gene mutations? Yes, Mother had cancer    Nurse/Assistant Credentials/time stamp:Leah A.Wright CMA 12:26pm     ----------------------------------------------------------------------------------------------------------------------------------------------------------------------------------------------------------------------  Because this visit was a virtual/telehealth visit, some criteria may be missing or patient reported. Any vitals not documented were not able to be obtained and vitals that have been documented are patient reported.    MEDICARE ANNUAL PREVENTIVE VISIT WITH PROVIDER: (Welcome to Medicare, initial annual wellness or annual wellness exam)  Virtual Visit via  Phone Note  I connected with Taylor Mccall on 07/23/23 by phone and verified that I  am speaking with the correct person using two identifiers. She prefers a phone visit.   Location patient: home Location provider:work or home office Persons participating in the virtual visit: patient, provider  Concerns and/or follow up today: no concern   See HM section in Epic for other details of completed HM.    ROS: negative for report of fevers, unintentional weight loss, vision changes, vision loss, hearing loss or change, chest pain, sob, hemoptysis, melena, hematochezia, hematuria, falls, bleeding or bruising, thoughts of suicide or self harm, memory loss  Patient-completed extensive health risk assessment - reviewed and discussed with the patient: See Health Risk Assessment completed with patient prior to the visit either above or in recent phone note. This was reviewed in detailed with the patient today and appropriate recommendations, orders and referrals were placed as needed per Summary below and patient instructions.   Review of Medical History: -PMH, PSH, Family History and current specialty and care providers reviewed and updated and listed below   Patient Care Team: Alto Atta, NP as PCP - General (Family Medicine) Timothy Ford, MD as Consulting Physician (Orthopedic Surgery) [facility] (Ophthalmology)   Past Medical History:  Diagnosis Date   Anxiety    Blood transfusion without reported diagnosis    Breast mass, left    Cataract    Depression    Difficult intubation 04/01/2020   Known difficult airway from previous records   Gait disturbance    GERD (gastroesophageal reflux disease)    Hypertension    Lymphedema of both lower extremities    Seeing OT at [facility] - dreeeing to Left Lower leg   Obesity    Rheumatoid arthritis(714.0)    Sleep apnea    cpap   Urinary incontinence    Venous insufficiency     Past Surgical History:  Procedure Laterality Date   ABDOMINAL HYSTERECTOMY     with vesicovaginal fistula  closure   ANTERIOR CERVICAL DECOMP/DISCECTOMY FUSION N/A 06/11/2022   Procedure: ANTERIOR CERVICAL DISCECTOMY FUSION C3-4, C5-6 WITH ALLOGRAFT, PLATE;  Surgeon: Adah Acron, MD;  Location: [facility] OR;  Service: Orthopedics;  Laterality: N/A;   ANTERIOR HIP REVISION Left 04/01/2020   Procedure: ANTERIOR LEFT HIP REVISION OF ACETABULAR COMPONENT;  Surgeon: Arnie Lao, MD;  Location: [facility] ORS;  Service: Orthopedics;  Laterality: Left;  3E   BLADDER SUSPENSION  2009   sling   BREAST REDUCTION SURGERY     COLONOSCOPY     ESOPHAGOGASTRODUODENOSCOPY     EYE SURGERY     REFRACTIVE SURGERY Right 06/26/2021   REPLACEMENT TOTAL KNEE Left    TOE SURGERY Left    Left great toe   toenail removal Right    all 5 toenails   TOTAL HIP ARTHROPLASTY     TOTAL KNEE ARTHROPLASTY Right 12/09/2013   Procedure: Right Total Knee Arthroplasty;  Surgeon: Timothy Ford, MD;  Location: [facility] OR;  Service: Orthopedics;  Laterality: Right;   TOTAL KNEE REVISION Left 11/25/2020   Procedure: LEFT TOTAL KNEE REVISION;  Surgeon: Arnie Lao, MD;  Location: [facility] ORS;  Service: Orthopedics;  Laterality: Left;    Social History   Socioeconomic History   Marital status: Widowed    Spouse name: Not on file   Number of children: Not on file   Years of education: Not on file   Highest education level: 12th grade  Occupational History   Not on file  Tobacco Use   Smoking status: Former    Current packs/day: 0.00    Average packs/day: 0.3 packs/day for 10.0 years (3.0 ttl pk-yrs)    Types: Cigarettes    Start date: 02/06/1968    Quit date: 02/05/1978    Years since quitting: 45.4    Passive exposure: Past   Smokeless tobacco: Never   Tobacco comments:    over 25 years ago...pt doesnt remember when she quit.   Vaping Use   Vaping status: Never Used  Substance and Sexual Activity   Alcohol  use: No   Drug use: No   Sexual activity: Not on file  Other Topics Concern   Not on file  Social History  Narrative   Not on file   Social Drivers of Health   Financial Resource Strain: Low Risk  (04/24/2021)   Overall Financial Resource Strain (CARDIA)    Difficulty of Paying Living Expenses: Not hard at all  Food Insecurity: No Food Insecurity (03/19/2022)   Hunger Vital Sign    Worried About Running Out of Food in the Last Year: Never true    Ran Out of Food in the Last Year: Never true  Transportation Needs: No Transportation Needs (03/13/2023)   Received from [facility]   PRAPARE - Transportation    Lack of Transportation (Medical): No    Lack of Transportation (Non-Medical): No  Physical Activity: Unknown (03/19/2022)   Exercise Vital Sign    Days of Exercise per Week: 0 days    Minutes of Exercise per Session: Not on file  Recent Concern: Physical Activity - Inactive (03/19/2022)   Exercise Vital Sign    Days of Exercise per Week: 0 days    Minutes of Exercise per Session: 30 min  Stress: No Stress Concern Present (03/13/2023)   Received from [facility]   [employer] of Occupational Health - Occupational Stress Questionnaire    Feeling of Stress : Only a little  Social Connections: Unknown (03/27/2023)   Received from [facility]   Social Connections    In a typical week, how many times do you talk on the phone with family, friends, or neighbors?: More than three times a week    How often do you get together with friends or relatives?: More than three times a week    Attends Religious Services: Not on file    Do you belong to any clubs or organizations such as church groups, unions, fraternal or athletic groups, or school groups?: No    How often do you attend meetings of the clubs or organizations you belong to?: Never    Marital Status: Not on file  Intimate Partner Violence: Not on file    Family History  Problem Relation Age of Onset   Coronary artery disease Father    Skin cancer Father    Heart disease Father    Heart  disease Sister    Asthma Brother    Heart disease Brother    Coronary artery disease Brother    Diabetes Other    Stroke Other    Colon cancer Neg Hx    Esophageal cancer Neg Hx    Rectal cancer Neg Hx    Stomach cancer Neg Hx     Current Outpatient Medications on File Prior to Visit  Medication Sig Dispense Refill   Acetaminophen  (TYLENOL  PO) Take by mouth as needed.     ALPRAZolam  (XANAX )  0.25 MG tablet Take 1 tablet (0.25 mg total) by mouth 2 (two) times daily as needed for anxiety. 20 tablet 2   aspirin  EC 81 MG tablet Take 1 tablet (81 mg total) by mouth 2 (two) times daily. Swallow whole. (Patient taking differently: Take 81 mg by mouth daily. Swallow whole.) 30 tablet 11   brimonidine  (ALPHAGAN ) 0.2 % ophthalmic solution Place 1 drop into both eyes 2 (two) times daily.     Cholecalciferol  (VITAMIN D3) 125 MCG (5000 UT) CHEW Chew 10,000 Units by mouth daily.     Cyanocobalamin  (VITAMIN B-12 PO) Take 6,000 mcg by mouth daily. Vitamin B-12 3000 mcg chew     furosemide  (LASIX ) 20 MG tablet Take 1 tablet (20 mg total) by mouth 2 (two) times daily. 180 tablet 3   hydroxychloroquine  (PLAQUENIL ) 200 MG tablet TAKE 1 TABLET BY MOUTH TWICE A DAY 180 tablet 0   latanoprost  (XALATAN ) 0.005 % ophthalmic solution Place 1 drop into both eyes at bedtime.     losartan  (COZAAR ) 25 MG tablet TAKE 1 TABLET (25 MG TOTAL) BY MOUTH DAILY. KEEP APPT FOR REFILLS 90 tablet 0   Multiple Vitamins-Minerals (MULTIVITAMIN GUMMIES WOMENS) CHEW Chew 2 each by mouth daily.     Multiple Vitamins-Minerals (PRESERVISION AREDS 2) CAPS Take 1 capsule by mouth 2 (two) times daily.     Omega-3 Fatty Acids (FISH OIL) 1200 MG CAPS Take 2,400 mg by mouth daily.     Polyethyl Glycol-Propyl Glycol (SYSTANE OP) Place 1 drop into both eyes every 6 (six) hours as needed (dry eyes).     potassium chloride  SA (KLOR-CON  M20) 20 MEQ tablet Take 1 tablet (20 mEq total) by mouth daily. 90 tablet 1   sertraline  (ZOLOFT ) 100 MG tablet  TAKE 1 TABLET BY MOUTH EVERY DAY IN THE MORNING 90 tablet 0   timolol  (TIMOPTIC ) 0.5 % ophthalmic solution Place 1 drop into both eyes 2 (two) times daily.     vitamin E  180 MG (400 UNITS) capsule Take 400 Units by mouth daily.     fluticasone  (FLONASE ) 50 MCG/ACT nasal spray Place 2 sprays into both nostrils daily. (Patient not taking: Reported on 07/23/2023) 16 g 6   rosuvastatin  (CRESTOR ) 5 MG tablet TAKE 1 TABLET BY MOUTH EVERYDAY AT BEDTIME 30 tablet 0   No current facility-administered medications on file prior to visit.    Allergies  Allergen Reactions   Penicillins Rash       Physical Exam Vitals requested from patient and listed below if patient had equipment and was able to obtain at home for this virtual visit: There were no vitals filed for this visit. Estimated body mass index is 37.82 kg/m as calculated from the following:   Height as of 06/26/23: 5' 10 (1.778 m).   Weight as of 06/26/23: 263 lb 9.6 oz (119.6 kg).  EKG (optional): deferred due to virtual visit  GENERAL: alert, oriented, no acute distress detected, full vision exam deferred due to pandemic and/or virtual encounter  PSYCH/NEURO: pleasant and cooperative, no obvious depression or anxiety, speech and thought processing grossly intact, Cognitive function grossly intact  Flowsheet Row Clinical Support from 07/23/2023 in [facility]  PHQ-9 Total Score 8        07/23/2023   12:10 PM 01/31/2023    1:10 PM 04/26/2022   11:08 AM 09/14/2021    9:12 AM 07/25/2021    7:44 AM  Depression screen PHQ 2/9  Decreased Interest 3 0 3 0 2  Down, Depressed, Hopeless 0 0 0 0 0  PHQ - 2 Score 3 0 3 0 2  Altered sleeping 3  3  3   Tired, decreased energy 0  3  3  Change in appetite 1  1  3   Feeling bad or failure about yourself  0  0  0  Trouble concentrating 0  0  0  Moving slowly or fidgety/restless 1  3  0  Suicidal thoughts 0  0  0  PHQ-9 Score 8  13  11   Difficult doing  work/chores   Somewhat difficult  Not difficult at all       09/13/2021   10:13 AM 09/14/2021    8:53 AM 03/19/2022    9:09 PM 04/26/2022   11:08 AM 07/23/2023   12:10 PM  Fall Risk  Falls in the past year? 1 1 1 1 1   Was there an injury with Fall? 1 0 0 1 0  Was there an injury with Fall? - Comments    bruises   Fall Risk Category Calculator 2 1 2   2   2 3 2   Fall Risk Category (Retired) Moderate  Low      (RETIRED) Patient Fall Risk Level Moderate fall risk  Low fall risk      Patient at Risk for Falls Due to Impaired balance/gait History of fall(s)     Fall risk Follow up Education provided  Falls evaluation completed    Falls evaluation completed     Data saved with a previous flowsheet row definition   Has RA, sometimes trips and falls, using can or Rolator walker at all times. Does some balance exercises rarely.   SUMMARY AND PLAN:  Encounter for Medicare annual wellness exam  Discussed applicable health maintenance/preventive health measures and advised and referred or ordered per patient preferences: -discussed vaccines due recs and risks/advised can get at the pharmacy -discussed bone density test, normal in 2023, she declined repeat for now Health Maintenance  Topic Date Due   COVID-19 Vaccine (6 - Pfizer risk 2024-25 season) 08/08/2023 (Originally 06/16/2023)   Zoster Vaccines- Shingrix (1 of 2) 10/23/2023 (Originally 10/06/1964)   INFLUENZA VACCINE  09/06/2023   Medicare Annual Wellness (AWV)  07/22/2024   DTaP/Tdap/Td (4 - Td or Tdap) 11/04/2031   Pneumococcal Vaccine: 50+ Years  Completed   DEXA SCAN  Completed   Hepatitis C Screening  Completed   HPV VACCINES  Aged Out   Meningococcal B Vaccine  Aged Out   Colonoscopy  Discontinued      Education and counseling on the following was provided based on the above review of health and a plan/checklist for the patient, along with additional information discussed, was provided for the patient in the patient  instructions :   -Provided counseling and plan for increased risk of falling if applicable per above screening. Discussed  safe balance exercises that can be done at home to improve balance and discussed exercise guidelines for adults with include balance exercises at least 3 days per week.  -Advised and counseled on a healthy lifestyle - including the importance of a healthy diet, regular physical activity, social connections and stress management. -Reviewed patient's current diet. Advised and counseled on a whole foods based healthy diet. A summary of a healthy diet was provided in the Patient Instructions.  -reviewed patient's current physical activity level and discussed exercise guidelines for adults. Discussed community resources and ideas for safe exercise at home to assist in meeting exercise  guideline recommendations in a safe and healthy way.  -Advise yearly dental visits at minimum and regular eye exams   Follow up: see patient instructions     Patient Instructions  I really enjoyed getting to talk with you today! I am available on Tuesdays and Thursdays for virtual visits if you have any questions or concerns, or if I can be of any further assistance.   CHECKLIST FROM ANNUAL WELLNESS VISIT:  -Follow up (please call to schedule if not scheduled after visit):   -yearly for annual wellness visit with primary care office  Here is a list of your preventive care/health maintenance measures and the plan for each if any are due:  PLAN For any measures below that may be due:    1.) Can get vaccines at the pharmacy. Please bring receipt when you do so that we can keep your immunization record up to date. Thank you.   Health Maintenance  Topic Date Due   COVID-19 Vaccine (6 - Pfizer risk 2024-25 season) 08/08/2023 (Originally 06/16/2023)   Zoster Vaccines- Shingrix (1 of 2) 10/23/2023 (Originally 10/06/1964)   INFLUENZA VACCINE  09/06/2023   Medicare Annual Wellness (AWV)   07/22/2024   DTaP/Tdap/Td (4 - Td or Tdap) 11/04/2031   Pneumococcal Vaccine: 50+ Years  Completed   DEXA SCAN  Completed   Hepatitis C Screening  Completed   HPV VACCINES  Aged Out   Meningococcal B Vaccine  Aged Out   Colonoscopy  Discontinued    -See a dentist at least yearly  -Get your eyes checked and then per your eye specialist's recommendations  -Other issues addressed today:   -I have included below further information regarding a healthy whole foods based diet, physical activity guidelines for adults, stress management and opportunities for social connections. I hope you find this information useful.   -----------------------------------------------------------------------------------------------------------------------------------------------------------------------------------------------------------------------------------------------------------    NUTRITION: -eat real food: lots of colorful vegetables (half the plate) and fruits -5-7 servings of vegetables and fruits per day (fresh or steamed is best), exp. 2 servings of vegetables with lunch and dinner and 2 servings of fruit per day. Berries and greens such as kale and collards are great choices.  -consume on a regular basis:  fresh fruits, fresh veggies, fish, nuts, seeds, healthy oils (such as olive oil, avocado oil), whole grains (make sure for bread/pasta/crackers/etc., that the first ingredient on label contains the word whole), legumes. -can eat small amounts of dairy and lean meat (no larger than the palm of your hand), but avoid processed meats such as ham, bacon, lunch meat, etc. -drink water  -try to avoid fast food and pre-packaged foods, processed meat, ultra processed foods/beverages (donuts, candy, etc.) -most experts advise limiting sodium to < 2300mg  per day, should limit further is any chronic conditions such as high blood pressure, heart disease, diabetes, etc. The American Heart Association advised  that < 1500mg  is is ideal -try to avoid foods/beverages that contain any ingredients with names you do not recognize  -try to avoid foods/beverages  with added sugar or sweeteners/sweets  -try to avoid sweet drinks (including diet drinks): soda, juice, Gatorade, sweet tea, power drinks, diet drinks -try to avoid white rice, white bread, pasta (unless whole grain)  EXERCISE GUIDELINES FOR ADULTS: -if you wish to increase your physical activity, do so gradually and with the approval of your doctor -STOP and seek medical care immediately if you have any chest pain, chest discomfort or trouble breathing when starting or increasing exercise  -move and stretch your  body, legs, feet and arms when sitting for long periods -Physical activity guidelines for optimal health in adults: -get at least 150 minutes per week of moderate exercise (can talk, but not sing); this is about 20-30 minutes of sustained activity 5-7 days per week or two 10-15 minute episodes of sustained activity 5-7 days per week -do some muscle building/resistance training/strength training at least 2 days per week  -balance exercises 3+ days per week:   Stand somewhere where you have something sturdy to hold onto if you lose balance    1) lift up on toes, then back down, start with 5x per day and work up to 20x   2) stand and lift one leg straight out to the side so that foot is a few inches of the floor, start with 5x each side and work up to 20x each side   3) stand on one foot, start with 5 seconds each side and work up to 20 seconds on each side  If you need ideas or help with getting more active:  -Silver sneakers https://tools.silversneakers.com  -Walk with a Doc: http://www.duncan-Scarpino.com/  -try to include resistance (weight lifting/strength building) and balance exercises twice per week: or the following link for  ideas: http://castillo-powell.com/  BuyDucts.dk  STRESS MANAGEMENT: -can try meditating, or just sitting quietly with deep breathing while intentionally relaxing all parts of your body for 5 minutes daily -if you need further help with stress, anxiety or depression please follow up with your primary doctor or contact the wonderful folks at [facility]: [phone]  SOCIAL CONNECTIONS: -options in [place] if you wish to engage in more social and exercise related activities:  -Silver sneakers https://tools.silversneakers.com  -Walk with a Doc: http://www.duncan-Duffus.com/  -Check out the [facility] Active Adults 50+ section on the [place] of [employer] (hiking clubs, book clubs, cards and games, chess, exercise classes, aquatic classes and much more) - see the website for details: https://www.-[place].gov/departments/parks-recreation/active-adults50  -YouTube has lots of exercise videos for different ages and abilities as well  -[facility] (a variety of indoor and outdoor inperson activities for adults). (218)721-3667. [address]  -Virtual Online Classes (a variety of topics): see seniorplanet.org or call [phone]  -consider volunteering at a school, hospice center, church, senior center or elsewhere            Maurie Southern, DO

## 2023-07-23 NOTE — Progress Notes (Signed)
 Patient was unable to self-report due to a lack of equipment at home via telehealth

## 2023-07-29 ENCOUNTER — Other Ambulatory Visit: Payer: Self-pay | Admitting: Adult Health

## 2023-07-29 DIAGNOSIS — I10 Essential (primary) hypertension: Secondary | ICD-10-CM

## 2023-07-29 DIAGNOSIS — Z0000 Encounter for general adult medical examination without abnormal findings: Secondary | ICD-10-CM

## 2023-09-02 ENCOUNTER — Other Ambulatory Visit: Payer: Self-pay | Admitting: Adult Health

## 2023-09-02 DIAGNOSIS — E785 Hyperlipidemia, unspecified: Secondary | ICD-10-CM

## 2023-10-14 ENCOUNTER — Ambulatory Visit (INDEPENDENT_AMBULATORY_CARE_PROVIDER_SITE_OTHER): Admitting: Adult Health

## 2023-10-14 ENCOUNTER — Encounter: Payer: Self-pay | Admitting: Adult Health

## 2023-10-14 VITALS — BP 128/78 | HR 66 | Temp 97.9°F | Ht 70.0 in | Wt 266.4 lb

## 2023-10-14 DIAGNOSIS — M069 Rheumatoid arthritis, unspecified: Secondary | ICD-10-CM

## 2023-10-14 DIAGNOSIS — Z87891 Personal history of nicotine dependence: Secondary | ICD-10-CM

## 2023-10-14 DIAGNOSIS — G4733 Obstructive sleep apnea (adult) (pediatric): Secondary | ICD-10-CM

## 2023-10-14 NOTE — Patient Instructions (Signed)
 Order for home sleep study.  Work on healthy weight loss.  Do not drive if sleepy .  Follow up in 6 weeks to discuss results and treatment plan .

## 2023-10-14 NOTE — Progress Notes (Signed)
 @Patient  ID: Taylor Mccall, female    DOB: [birth year], 78 y.o.   MRN: [id number]  Chief Complaint  Patient presents with   Consult    New patient sleep,last seen in 2020 no download    Referring provider: Merna Huxley, NP  HPI: 78 year old female seen for sleep consult October 14, 2023 to reestablish for sleep apnea Medical history significant for rheumatoid arthritis and lymphedema  TEST/EVENTS :  NPSG 2009:  AHI 21/hr.  Auto 2014:  Optimal pressure 15cm  10/14/2023 Sleep consult  Discussed the use of AI scribe software for clinical note transcription with the patient, who gave verbal consent to proceed.  History of Present Illness Taylor Mccall is an 77 year old female with moderate sleep apnea who presents for a sleep consult.  She has not used her CPAP machine for the past two years due to discomfort related to neck issues. She experienced improvement in her symptoms when she previously used the CPAP. She has not undergone a sleep study in the last five years and is experiencing increased daytime sleepiness, often falling asleep if left alone. She wakes up frequently at night, sometimes after only two hours of sleep, and experiences snoring and occasional nightmares. No use of oxygen, and no history of congestive heart failure or stroke. Epworth score is 12 out 24 typically gets sleepy at rest, watching TV and in evening hours. No symptoms suspicious for cataplexy or sleep paralysis. Weight is 266 BMI 38.   She has a history of high blood pressure and rheumatoid arthritisil. She experiences significant pain and stiffness due to her arthritis, describing it as 'killing me' and notes that she is 'stiff most of the time.' She uses a walker when outside the house but manages without it indoors.  Her social history includes living with her daughter since her husband's passing. She is a widow, does not smoke, and does not consume alcohol  or drugs. She drinks two cups of  caffeine daily and takes naps. She does not drive and relies on her daughter for transportation.     Past Surgical History:  Procedure Laterality Date   ABDOMINAL HYSTERECTOMY     with vesicovaginal fistula closure   ANTERIOR CERVICAL DECOMP/DISCECTOMY FUSION N/A 06/11/2022   Procedure: ANTERIOR CERVICAL DISCECTOMY FUSION C3-4, C5-6 WITH ALLOGRAFT, PLATE;  Surgeon: Barbarann Oneil BROCKS, MD;  Location: [facility] OR;  Service: Orthopedics;  Laterality: N/A;   ANTERIOR HIP REVISION Left 04/01/2020   Procedure: ANTERIOR LEFT HIP REVISION OF ACETABULAR COMPONENT;  Surgeon: Vernetta Lonni GRADE, MD;  Location: [facility];  Service: Orthopedics;  Laterality: Left;  3E   BLADDER SUSPENSION  2009   sling   BREAST REDUCTION SURGERY     COLONOSCOPY     ESOPHAGOGASTRODUODENOSCOPY     EYE SURGERY     REFRACTIVE SURGERY Right 06/26/2021   REPLACEMENT TOTAL KNEE Left    TOE SURGERY Left    Left great toe   toenail removal Right    all 5 toenails   TOTAL HIP ARTHROPLASTY     TOTAL KNEE ARTHROPLASTY Right 12/09/2013   Procedure: Right Total Knee Arthroplasty;  Surgeon: Jerona Harden GAILS, MD;  Location: [facility] OR;  Service: Orthopedics;  Laterality: Right;   TOTAL KNEE REVISION Left 11/25/2020   Procedure: LEFT TOTAL KNEE REVISION;  Surgeon: Vernetta Lonni GRADE, MD;  Location: [facility];  Service: Orthopedics;  Laterality: Left;     Allergies  Allergen Reactions   Penicillins Rash    Immunization History  Administered Date(s) Administered   Fluad Quad(high Dose 65+) 11/03/2021   INFLUENZA, HIGH DOSE SEASONAL PF 10/26/2015, 12/11/2016, 01/06/2018, 10/07/2018, 11/15/2020, 12/17/2022   Influenza Split 12/14/2010, 12/06/2012   Influenza Whole 02/06/2004, 12/08/2009   Influenza,inj,Quad PF,6+ Mos 10/15/2013, 11/19/2014   Influenza-Unspecified 10/07/2018   PFIZER(Purple Top)SARS-COV-2 Vaccination 03/01/2019, 03/21/2019, 10/06/2019   Pfizer Covid-19 Vaccine Bivalent Booster 59yrs & up 11/15/2020    Pfizer(Comirnaty)Fall Seasonal Vaccine 12 years and older 12/17/2022   Pneumococcal Conjugate-13 03/18/2013   Pneumococcal Polysaccharide-23 06/30/2008, 10/26/2015   Td 02/05/2005   Tdap 12/14/2010, 11/03/2021    Past Medical History:  Diagnosis Date   Anxiety    Blood transfusion without reported diagnosis    Breast mass, left    Cataract    Depression    Difficult intubation 04/01/2020   Known difficult airway from previous records   Gait disturbance    GERD (gastroesophageal reflux disease)    Hypertension    Lymphedema of both lower extremities    Seeing OT at [facility] - dreeeing to Left Lower leg   Obesity    Rheumatoid arthritis(714.0)    Sleep apnea    cpap   Urinary incontinence    Venous insufficiency     Tobacco History: Social History   Tobacco Use  Smoking Status Former   Current packs/day: 0.00   Average packs/day: 0.3 packs/day for 10.0 years (3.0 ttl pk-yrs)   Types: Cigarettes   Start date: 02/06/1968   Quit date: 02/05/1978   Years since quitting: 45.7   Passive exposure: Past  Smokeless Tobacco Never  Tobacco Comments   over 25 years ago...pt doesnt remember when she quit.    Counseling given: Not Answered Tobacco comments: over 25 years ago...pt doesnt remember when she quit.    Outpatient Medications Prior to Visit  Medication Sig Dispense Refill   Acetaminophen  (TYLENOL  PO) Take by mouth as needed.     ALPRAZolam  (XANAX ) 0.25 MG tablet Take 1 tablet (0.25 mg total) by mouth 2 (two) times daily as needed for anxiety. 20 tablet 2   aspirin  EC 81 MG tablet Take 1 tablet (81 mg total) by mouth 2 (two) times daily. Swallow whole. 30 tablet 11   brimonidine  (ALPHAGAN ) 0.2 % ophthalmic solution Place 1 drop into both eyes 2 (two) times daily.     Cholecalciferol  (VITAMIN D3) 125 MCG (5000 UT) CHEW Chew 10,000 Units by mouth daily.     Cyanocobalamin  (VITAMIN B-12 PO) Take 6,000 mcg by mouth daily. Vitamin B-12 3000 mcg chew      furosemide  (LASIX ) 20 MG tablet Take 1 tablet (20 mg total) by mouth 2 (two) times daily. 180 tablet 3   hydroxychloroquine  (PLAQUENIL ) 200 MG tablet TAKE 1 TABLET BY MOUTH TWICE A DAY 180 tablet 0   KLOR-CON  M20 20 MEQ tablet TAKE 1 TABLET BY MOUTH DAILY 90 tablet 1   latanoprost  (XALATAN ) 0.005 % ophthalmic solution Place 1 drop into both eyes at bedtime.     losartan  (COZAAR ) 25 MG tablet TAKE 1 TABLET (25 MG TOTAL) BY MOUTH DAILY. KEEP APPT FOR REFILLS 90 tablet 0   Multiple Vitamins-Minerals (MULTIVITAMIN GUMMIES WOMENS) CHEW Chew 2 each by mouth daily.     Omega-3 Fatty Acids (FISH OIL) 1200 MG CAPS Take 2,400 mg by mouth daily.     Polyethyl Glycol-Propyl Glycol (SYSTANE OP) Place 1 drop into both eyes every 6 (six) hours as needed (dry eyes).     rosuvastatin  (CRESTOR ) 5 MG tablet TAKE 1 TABLET BY  MOUTH EVERYDAY AT BEDTIME 30 tablet 0   sertraline  (ZOLOFT ) 100 MG tablet TAKE 1 TABLET BY MOUTH EVERY DAY IN THE MORNING 90 tablet 0   timolol  (TIMOPTIC ) 0.5 % ophthalmic solution Place 1 drop into both eyes 2 (two) times daily.     vitamin E  180 MG (400 UNITS) capsule Take 400 Units by mouth daily.     fluticasone  (FLONASE ) 50 MCG/ACT nasal spray Place 2 sprays into both nostrils daily. (Patient not taking: Reported on 10/14/2023) 16 g 6   Multiple Vitamins-Minerals (PRESERVISION AREDS 2) CAPS Take 1 capsule by mouth 2 (two) times daily. (Patient not taking: Reported on 10/14/2023)     No facility-administered medications prior to visit.     Review of Systems:   Constitutional:   No  weight loss, night sweats,  Fevers, chills+ fatigue, or  lassitude.  HEENT:   No headaches,  Difficulty swallowing,  Tooth/dental problems, or  Sore throat,                No sneezing, itching, ear ache, nasal congestion, post nasal drip,   CV:  No chest pain,  Orthopnea, PND, , anasarca, dizziness, palpitations, syncope.   GI  No heartburn, indigestion, abdominal pain, nausea, vomiting, diarrhea, change in  bowel habits, loss of appetite, bloody stools.   Resp: No shortness of breath with exertion or at rest.  No excess mucus, no productive cough,  No non-productive cough,  No coughing up of blood.  No change in color of mucus.  No wheezing.  No chest wall deformity  Skin: no rash or lesions.  GU: no dysuria, change in color of urine, no urgency or frequency.  No flank pain, no hematuria   MS:  Chronic joint pain    Physical Exam  BP 128/78   Pulse 66   Temp 97.9 F (36.6 C) (Oral)   Ht 5' 10 (1.778 m)   Wt 266 lb 6.4 oz (120.8 kg)   SpO2 100%   BMI 38.22 kg/m   GEN: A/Ox3; pleasant , NAD, well nourished , walker    HEENT:  [place]/AT,    NOSE-clear, THROAT-clear, no lesions, no postnasal drip or exudate noted. Class 3 MP airway   NECK:  Supple w/ fair ROM; no JVD; normal carotid impulses w/o bruits; no thyromegaly or nodules palpated; no lymphadenopathy.    RESP  Clear  P & A; w/o, wheezes/ rales/ or rhonchi. no accessory muscle use, no dullness to percussion  CARD:  RRR, no m/r/g, pulses intact, no cyanosis or clubbing. +lymphedema LE   GI:   Soft & nt; nml bowel sounds; no organomegaly or masses detected.   Musco: Warm bil, no deformities or joint swelling noted.   Neuro: alert, no focal deficits noted.    Skin: Warm, no lesions or rashes    Lab Results:  CBC    Component Value Date/Time   WBC 4.7 06/26/2023 1556   RBC 4.13 06/26/2023 1556   HGB 12.1 06/26/2023 1556   HCT 37.8 06/26/2023 1556   PLT 234 06/26/2023 1556   MCV 91.5 06/26/2023 1556   MCH 29.3 06/26/2023 1556   MCHC 32.0 06/26/2023 1556   RDW 12.0 06/26/2023 1556   LYMPHSABS 1,260 08/15/2022 1407   MONOABS 0.4 09/14/2021 0929   EOSABS 362 06/26/2023 1556   BASOSABS 61 06/26/2023 1556    BMET    Component Value Date/Time   NA 140 06/26/2023 1556   K 4.0 06/26/2023 1556   CL 103 06/26/2023  1556   CO2 29 06/26/2023 1556   GLUCOSE 77 06/26/2023 1556   BUN 17 06/26/2023 1556   CREATININE  0.95 06/26/2023 1556   CALCIUM  9.7 06/26/2023 1556   GFRNONAA >60 06/01/2022 1505   GFRNONAA 84 07/26/2020 1000   GFRAA 98 07/26/2020 1000    BNP No results found for: BNP  ProBNP    Component Value Date/Time   PROBNP 27.0 07/25/2017 1224    Imaging: No results found.  Administration History     None           No data to display          No results found for: NITRICOXIDE      Assessment & Plan:   No problem-specific Assessment & Plan notes found for this encounter. Assessment and Plan Assessment & Plan Obstructive sleep apnea   History of Moderate obstructive sleep apnea with two years of non-compliance to CPAP therapy. She experiences daytime sleepiness, snoring, and nightmares. The current CPAP machine is outdated and lacks data recording capabilities. She is interested in restarting CPAP  Order a home sleep study through [facility]. Discuss the potential need for a new CPAP machine based on sleep study results. Schedule a follow-up visit in six weeks to review the sleep study results and discuss CPAP therapy. - discussed how weight can impact sleep and risk for sleep disordered breathing - discussed options to assist with weight loss: combination of diet modification, cardiovascular and strength training exercises   - had an extensive discussion regarding the adverse health consequences related to untreated sleep disordered breathing - specifically discussed the risks for hypertension, coronary artery disease, cardiac dysrhythmias, cerebrovascular disease, and diabetes - lifestyle modification discussed   - discussed how sleep disruption can increase risk of accidents, particularly when driving - safe driving practices were discussed     Rheumatoid arthritis   Chronic rheumatoid arthritis with persistent pain and stiffness, currently managed with Plaquenil . Follow up with Rheumatology   Obesity-healthy weight loss   Plan  Patient  Instructions  Order for home sleep study.  Work on healthy weight loss.  Do not drive if sleepy .  Follow up in 6 weeks to discuss results and treatment plan .         Madelin Stank, NP 10/14/2023

## 2023-10-30 ENCOUNTER — Ambulatory Visit (INDEPENDENT_AMBULATORY_CARE_PROVIDER_SITE_OTHER): Admitting: Physical Medicine and Rehabilitation

## 2023-10-30 ENCOUNTER — Encounter: Payer: Self-pay | Admitting: Physical Medicine and Rehabilitation

## 2023-10-30 VITALS — BP 141/76 | HR 72

## 2023-10-30 DIAGNOSIS — M542 Cervicalgia: Secondary | ICD-10-CM

## 2023-10-30 DIAGNOSIS — G8929 Other chronic pain: Secondary | ICD-10-CM

## 2023-10-30 DIAGNOSIS — M961 Postlaminectomy syndrome, not elsewhere classified: Secondary | ICD-10-CM

## 2023-10-30 DIAGNOSIS — M47819 Spondylosis without myelopathy or radiculopathy, site unspecified: Secondary | ICD-10-CM

## 2023-10-30 NOTE — Progress Notes (Unsigned)
 ZOILA DITULLIO - 78 y.o. female MRN [id number]  Date of birth: 02/12/1945  Office Visit Note: Visit Date: 10/30/2023 PCP: Merna Huxley, NP Referred by: Merna Huxley, NP  Subjective: Chief Complaint  Patient presents with   Neck - Pain   Left Arm - Pain, Tingling, Numbness   Right Arm - Pain, Numbness, Tingling   HPI: ILA LANDOWSKI is a 78 y.o. female who comes in today for evaluation of chronic, worsening and severe right sided neck pain. She is previous patient of Dr. Oneil Herald. History of C3-C4 and C5-C6 ACDF with Dr. Herald in 2024. Pain ongoing for several years, worsens when laying down to sleep. States she is sleeping in recliner. Also reports severe pain with side to side rotation of her neck. She describes her pain as sore and sharp sensation, currently rates as 8 out of 10. Some relief of pain with home exercise regimen, rest and use of medications. Lumbar MRI imaging from 2024 shows previous ACDF C3 through C6. Interbody spacer at the C3-C4 level. Chronic fusion at the C4-C5 level, pre dating the recent surgery. There is pronounced degenerative change at the C1-C2 articulation that could be a source of pain. Foraminal narrowing on the left at C7-T1 that could affect the C8 nerve. No high grade spinal canal stenosis. Patient denies focal weakness. She does have chronic paresthesias to hand, prior bilateral upper extremity nerve study in 2021 shows moderate median neuropathy. She is using rolling walker to assist with ambulation. No recent trauma or falls.       Review of Systems  Musculoskeletal:  Positive for neck pain.  Neurological:  Positive for tingling. Negative for focal weakness and weakness.  All other systems reviewed and are negative.  Otherwise per HPI.  Assessment & Plan: Visit Diagnoses:    ICD-10-CM   1. Neck pain, chronic  M54.2    G89.29     2. Spondylosis without myelopathy or radiculopathy  M47.819     3. Post laminectomy syndrome  M96.1         Plan: Findings:  Chronic, worsening and severe right sided neck pain.  No radicular symptoms down the arms.  Patient continues to have severe pain despite good conservative therapy such as home exercise regimen, rest and use of medications.  Patient's clinical presentation and exam are consistent with more facet mediated pain.  She does have pain with side-to-side rotation of her neck.  There are significant degenerative changes to the C1-C2 articulation that could be a source of pain for her.  We discussed treatment plan in detail today.  Next step is to perform diagnostic and hopefully therapeutic right C2-C3 facet joint injection under fluoroscopic guidance.  If good relief of pain with injection we can repeat this procedure infrequently as needed.  Should her pain persist would recommend referral to our spine surgeon Dr. Ozell Ada for further evaluation.  I discussed injection procedure in detail today, she has no questions at this time.  No red flag symptoms noted upon exam today.    Meds & Orders: No orders of the defined types were placed in this encounter.  No orders of the defined types were placed in this encounter.   Follow-up: Return for Right C2-C3 facet joint injection.   Procedures: No procedures performed      Clinical History: Narrative & Impression CLINICAL DATA:  Neck pain, chronic and acute. Prior cervical surgery. Pain in the feet and hands over the last year.   EXAM:  MRI CERVICAL SPINE WITHOUT CONTRAST   TECHNIQUE: Multiplanar, multisequence MR imaging of the cervical spine was performed. No intravenous contrast was administered.   COMPARISON:  Radiography 08/14/2022.  MRI 04/25/2022   FINDINGS: Alignment: Straightening of the normal cervical lordosis. Mild kyphotic curvature.   Vertebrae: Previous ACDF C3 through C6. Interbody spacer at the C3-4 level. Chronic fusion at the C4-5 level, pre dating the recent surgery.   Cord: No cord compression or  focal cord lesion.   Posterior Fossa, vertebral arteries, paraspinal tissues: No visible intracranial finding of significance.   Disc levels:   Pronounced degenerative change at the C1-2 articulation. Narrowing of the upper spinal canal but no compressive effect upon the cord. These degenerative changes could be a cause regional pain. These are worse on the right than the left.   C2-3: Endplate osteophytes covering bulging disc material, centrally predominant. Effacement of the ventral subarachnoid space in the midline but no frank compression of the cord. Ample subarachnoid space present dorsal to the cord. Foramina widely patent.   C3 through C6: Previous ACDF. No complicating feature is seen. Sufficient patency of the canal and foramina through that segment. As noted above, findings at C4-5 are chronic.   C6-7: Chronic disc degeneration with loss of disc height. Question fusion across this disc space. No compressive canal stenosis. Foramina appear sufficiently patent.   C7-T1: Facet arthropathy worse on the left than the right. No compressive canal stenosis. Foraminal narrowing on the left that could affect the C8 nerve.   IMPRESSION: 1. Previous ACDF C3 through C6. No complicating feature is seen. Sufficient patency of the canal and foramina through that segment. 2. Pronounced degenerative change at the C1-2 articulation, worse on the right than the left. Narrowing of the upper spinal canal but no compressive effect upon the cord. These degenerative changes could be a cause of regional pain. 3. C2-3: Endplate osteophytes covering bulging disc material, centrally predominant. Effacement of the ventral subarachnoid space in the midline but no frank compression of the cord. Ample subarachnoid space present dorsal to the cord. Foramina widely patent. 4. C6-7: Chronic disc degeneration with loss of disc height. Question fusion across this disc space. No compressive  canal stenosis. Foramina appear sufficiently patent. 5. C7-T1: Facet arthropathy worse on the left than the right. Foraminal narrowing on the left that could affect the C8 nerve.     Electronically Signed   By: Oneil Officer M.D.   On: 11/02/2022 09:04   She reports that she quit smoking about 45 years ago. Her smoking use included cigarettes. She started smoking about 55 years ago. She has a 3 pack-year smoking history. She has been exposed to tobacco smoke. She has never used smokeless tobacco. No results for input(s): HGBA1C, LABURIC in the last 8760 hours.  Objective:  VS:  HT:    WT:   BMI:     BP:(!) 141/76  HR:72bpm  TEMP: ( )  RESP:  Physical Exam Vitals and nursing note reviewed.  HENT:     Head: Normocephalic and atraumatic.     Right Ear: External ear normal.     Left Ear: External ear normal.     Nose: Nose normal.     Mouth/Throat:     Mouth: Mucous membranes are moist.  Eyes:     Extraocular Movements: Extraocular movements intact.  Abdominal:     General: Abdomen is flat. There is no distension.  Musculoskeletal:        General: Tenderness present.  Comments: Discomfort noted with side-to-side rotation. Patient has good strength in the upper extremities including 5 out of 5 strength in wrist extension, long finger flexion and APB. Shoulder range of motion is full bilaterally without any sign of impingement. There is no atrophy of the hands intrinsically. Sensation intact bilaterally. Negative Hoffman's sign. Negative Spurling's sign.     Skin:    General: Skin is warm and dry.     Capillary Refill: Capillary refill takes less than 2 seconds.  Neurological:     Mental Status: She is alert and oriented to person, place, and time.     Gait: Gait abnormal.  Psychiatric:        Mood and Affect: Mood normal.        Behavior: Behavior normal.     Ortho Exam  Imaging: No results found.  Past Medical/Family/Surgical/Social History: Medications &  Allergies reviewed per EMR, new medications updated. Patient Active Problem List   Diagnosis Date Noted   Carpal tunnel syndrome, right upper limb 11/02/2022   S/P cervical spinal fusion 06/11/2022   Spinal stenosis of cervical region 05/02/2022   Status post revision of total knee replacement, left 11/25/2020   Failed total knee, left, subsequent encounter 11/24/2020   Status post revision of total hip 04/01/2020   Polyethylene liner wear following total hip arthroplasty requiring isolated polyethylene liner exchange 03/31/2020   Intermediate stage nonexudative age-related macular degeneration of right eye 12/23/2019   Exudative age-related macular degeneration of left eye with inactive choroidal neovascularization (HCC) 12/23/2019   Primary open angle glaucoma of both eyes, moderate stage 12/23/2019   Exudative retinopathy of left eye 12/23/2019   Routine general medical examination at a health care facility 12/11/2016   Primary osteoarthritis of left hip 05/01/2016   Autoimmune disease 11/29/2015   High risk medication use 11/29/2015   Vitamin D  deficiency 11/29/2015   Macular degeneration 11/29/2015   Bilateral lower extremity edema 10/26/2015   H/O total knee replacement, bilateral 12/09/2013   Breast mass, left 01/18/2011   Obesity 12/08/2009   Venous (peripheral) insufficiency 07/28/2009   PAIN IN JOINT, ANKLE AND FOOT 01/12/2008   Obstructive sleep apnea 08/29/2007   GAIT DISTURBANCE 08/14/2007   Depression 08/26/2006   Essential hypertension 08/26/2006   Primary osteoarthritis of both hands 08/26/2006   Urinary incontinence 08/26/2006   Past Medical History:  Diagnosis Date   Anxiety    Blood transfusion without reported diagnosis    Breast mass, left    Cataract    Depression    Difficult intubation 04/01/2020   Known difficult airway from previous records   Gait disturbance    GERD (gastroesophageal reflux disease)    Hypertension    Lymphedema of both lower  extremities    Seeing OT at [facility] - dreeeing to Left Lower leg   Obesity    Rheumatoid arthritis(714.0)    Sleep apnea    cpap   Urinary incontinence    Venous insufficiency    Family History  Problem Relation Age of Onset   Coronary artery disease Father    Skin cancer Father    Heart disease Father    Heart disease Sister    Asthma Brother    Heart disease Brother    Coronary artery disease Brother    Diabetes Other    Stroke Other    Colon cancer Neg Hx    Esophageal cancer Neg Hx    Rectal cancer Neg Hx    Stomach cancer Neg  Hx    Past Surgical History:  Procedure Laterality Date   ABDOMINAL HYSTERECTOMY     with vesicovaginal fistula closure   ANTERIOR CERVICAL DECOMP/DISCECTOMY FUSION N/A 06/11/2022   Procedure: ANTERIOR CERVICAL DISCECTOMY FUSION C3-4, C5-6 WITH ALLOGRAFT, PLATE;  Surgeon: Barbarann Oneil BROCKS, MD;  Location: [facility] OR;  Service: Orthopedics;  Laterality: N/A;   ANTERIOR HIP REVISION Left 04/01/2020   Procedure: ANTERIOR LEFT HIP REVISION OF ACETABULAR COMPONENT;  Surgeon: Vernetta Lonni GRADE, MD;  Location: [facility];  Service: Orthopedics;  Laterality: Left;  3E   BLADDER SUSPENSION  2009   sling   BREAST REDUCTION SURGERY     COLONOSCOPY     ESOPHAGOGASTRODUODENOSCOPY     EYE SURGERY     REFRACTIVE SURGERY Right 06/26/2021   REPLACEMENT TOTAL KNEE Left    TOE SURGERY Left    Left great toe   toenail removal Right    all 5 toenails   TOTAL HIP ARTHROPLASTY     TOTAL KNEE ARTHROPLASTY Right 12/09/2013   Procedure: Right Total Knee Arthroplasty;  Surgeon: Jerona Harden GAILS, MD;  Location: [facility] OR;  Service: Orthopedics;  Laterality: Right;   TOTAL KNEE REVISION Left 11/25/2020   Procedure: LEFT TOTAL KNEE REVISION;  Surgeon: Vernetta Lonni GRADE, MD;  Location: [facility];  Service: Orthopedics;  Laterality: Left;   Social History   Occupational History   Not on file  Tobacco Use   Smoking status: Former    Current packs/day: 0.00     Average packs/day: 0.3 packs/day for 10.0 years (3.0 ttl pk-yrs)    Types: Cigarettes    Start date: 02/06/1968    Quit date: 02/05/1978    Years since quitting: 45.7    Passive exposure: Past   Smokeless tobacco: Never   Tobacco comments:    over 25 years ago...pt doesnt remember when she quit.   Vaping Use   Vaping status: Never Used  Substance and Sexual Activity   Alcohol  use: No   Drug use: No   Sexual activity: Not on file

## 2023-10-30 NOTE — Progress Notes (Unsigned)
 Pain Scale   Average Pain 5  Patient states she has been having neck pain since her surgery in March 2023. She has pain, numbness and tingling in both arms.

## 2023-10-31 ENCOUNTER — Other Ambulatory Visit: Payer: Self-pay | Admitting: Adult Health

## 2023-10-31 DIAGNOSIS — Z0000 Encounter for general adult medical examination without abnormal findings: Secondary | ICD-10-CM

## 2023-11-11 ENCOUNTER — Telehealth: Payer: Self-pay

## 2023-11-11 NOTE — Telephone Encounter (Signed)
 Copied from CRM [id number]. Topic: Clinical - Medication Question >> Nov 11, 2023  4:08 PM Rozanna MATSU wrote: Reason for CRM: pt called stated she rec'd message through my chart to schedule appt but stated she has not rec'd home sleep study that provider order and staed would be mailed. She doesn't want to schedule follow up until she gets the sleep study test. Please follow up with pt.  Routing to [facility] to advise on this.

## 2023-11-12 NOTE — Telephone Encounter (Signed)
 I have called [facility] in regards to this. Waiting on further info

## 2023-11-12 NOTE — Telephone Encounter (Signed)
 [facility] has reached out to the patient and cannot reach her. Jennifer from [facility] LVM this am 10/ and I LVM with the number for [facility]

## 2023-11-13 NOTE — Progress Notes (Signed)
 Office Visit Note  Patient: Taylor Mccall             Date of Birth: April 11, 1945           MRN: [id number]             PCP: Merna Huxley, NP Referring: Merna Huxley, NP Visit Date: 11/27/2023 Occupation: Data Unavailable  Subjective:  Medication monitoring   History of Present Illness: Taylor Mccall is a 78 y.o. female with history of autoimmune disease and osteoarthritis.  Patient remains on plaquenil  200 mg 1 tablet by mouth twice daily. She is tolerating Plaquenil  without any side effects and has not had any gaps in therapy.  She denies any signs or symptoms of a flare.  She has not had any new concerns since her last office visit.  She denies any new medical conditions.  Patient continues to experience morning stiffness lasting for 1 to 2 hours daily.  She is been using a rollator walker to assist with ambulation.  She continues to have lymphedema affecting bilateral lower extremities.  She continues to have ongoing discomfort in her cervical spine.  She had an injection performed today. She denies any recent rashes and has not noticed any increased photosensitivity.  She denies any symptoms of Raynaud's phenomenon.  She continues to experience fatigue on a daily basis.  She has ongoing sicca symptoms but denies any oral or nasal ulcers    Activities of Daily Living:  Patient reports morning stiffness for 1 hour  Patient Reports nocturnal pain.  Difficulty dressing/grooming: Denies Difficulty climbing stairs: Reports Difficulty getting out of chair: Reports Difficulty using hands for taps, buttons, cutlery, and/or writing: Denies  Review of Systems  Constitutional:  Positive for fatigue.  HENT:  Positive for mouth dryness. Negative for mouth sores and nose dryness.   Eyes:  Positive for dryness. Negative for pain and visual disturbance.  Respiratory:  Negative for cough, hemoptysis, shortness of breath and difficulty breathing.   Cardiovascular:  Negative for chest  pain, palpitations, hypertension and swelling in legs/feet.  Gastrointestinal:  Negative for blood in stool, constipation and diarrhea.  Endocrine: Negative for increased urination.  Genitourinary:  Negative for painful urination.  Musculoskeletal:  Positive for joint pain, joint pain and morning stiffness. Negative for joint swelling, myalgias, muscle weakness, muscle tenderness and myalgias.  Skin:  Negative for color change, pallor, rash, hair loss, nodules/bumps, skin tightness, ulcers and sensitivity to sunlight.  Allergic/Immunologic: Negative for susceptible to infections.  Neurological:  Negative for dizziness, numbness, headaches and weakness.  Hematological:  Negative for swollen glands.  Psychiatric/Behavioral:  Negative for depressed mood and sleep disturbance. The patient is not nervous/anxious.     PMFS History:  Patient Active Problem List   Diagnosis Date Noted   Carpal tunnel syndrome, right upper limb 11/02/2022   S/P cervical spinal fusion 06/11/2022   Spinal stenosis of cervical region 05/02/2022   Status post revision of total knee replacement, left 11/25/2020   Failed total knee, left, subsequent encounter 11/24/2020   Status post revision of total hip 04/01/2020   Polyethylene liner wear following total hip arthroplasty requiring isolated polyethylene liner exchange 03/31/2020   Intermediate stage nonexudative age-related macular degeneration of right eye 12/23/2019   Exudative age-related macular degeneration of left eye with inactive choroidal neovascularization (HCC) 12/23/2019   Primary open angle glaucoma of both eyes, moderate stage 12/23/2019   Exudative retinopathy of left eye 12/23/2019   Routine general medical examination at a health care  facility 12/11/2016   Primary osteoarthritis of left hip 05/01/2016   Autoimmune disease 11/29/2015   High risk medication use 11/29/2015   Vitamin D  deficiency 11/29/2015   Macular degeneration 11/29/2015    Bilateral lower extremity edema 10/26/2015   H/O total knee replacement, bilateral 12/09/2013   Breast mass, left 01/18/2011   Obesity 12/08/2009   Venous (peripheral) insufficiency 07/28/2009   PAIN IN JOINT, ANKLE AND FOOT 01/12/2008   Obstructive sleep apnea 08/29/2007   GAIT DISTURBANCE 08/14/2007   Depression 08/26/2006   Essential hypertension 08/26/2006   Primary osteoarthritis of both hands 08/26/2006   Urinary incontinence 08/26/2006    Past Medical History:  Diagnosis Date   Allergy    Anxiety    Arthritis    Blood transfusion without reported diagnosis    Breast mass, left    Cataract    Depression    Difficult intubation 04/01/2020   Known difficult airway from previous records   Gait disturbance    GERD (gastroesophageal reflux disease)    Glaucoma    Hypertension    Lymphedema of both lower extremities    Seeing OT at [facility] - dreeeing to Left Lower leg   Obesity    Rheumatoid arthritis(714.0)    Sleep apnea    cpap   Urinary incontinence    Venous insufficiency     Family History  Problem Relation Age of Onset   Cancer Mother    Coronary artery disease Father    Skin cancer Father    Heart disease Father    Cancer Father    Diabetes Father    Stroke Father    Heart disease Sister    Arthritis Sister    Heart disease Sister    Asthma Brother    Heart disease Brother    Diabetes Brother    Coronary artery disease Brother    Cancer Brother    Diabetes Other    Stroke Other    Colon cancer Neg Hx    Esophageal cancer Neg Hx    Rectal cancer Neg Hx    Stomach cancer Neg Hx    Past Surgical History:  Procedure Laterality Date   ABDOMINAL HYSTERECTOMY     with vesicovaginal fistula closure   ANTERIOR CERVICAL DECOMP/DISCECTOMY FUSION N/A 06/11/2022   Procedure: ANTERIOR CERVICAL DISCECTOMY FUSION C3-4, C5-6 WITH ALLOGRAFT, PLATE;  Surgeon: Barbarann Oneil BROCKS, MD;  Location: [facility] OR;  Service: Orthopedics;  Laterality: N/A;    ANTERIOR HIP REVISION Left 04/01/2020   Procedure: ANTERIOR LEFT HIP REVISION OF ACETABULAR COMPONENT;  Surgeon: Vernetta Lonni GRADE, MD;  Location: [facility] ORS;  Service: Orthopedics;  Laterality: Left;  3E   BLADDER SUSPENSION  2009   sling   BREAST REDUCTION SURGERY     BREAST SURGERY     COLONOSCOPY     ESOPHAGOGASTRODUODENOSCOPY     EYE SURGERY     JOINT REPLACEMENT     REFRACTIVE SURGERY Right 06/26/2021   REPLACEMENT TOTAL KNEE Left    TOE SURGERY Left    Left great toe   toenail removal Right    all 5 toenails   TOTAL HIP ARTHROPLASTY     TOTAL KNEE ARTHROPLASTY Right 12/09/2013   Procedure: Right Total Knee Arthroplasty;  Surgeon: Jerona Harden GAILS, MD;  Location: [facility] OR;  Service: Orthopedics;  Laterality: Right;   TOTAL KNEE REVISION Left 11/25/2020   Procedure: LEFT TOTAL KNEE REVISION;  Surgeon: Vernetta Lonni GRADE, MD;  Location: [facility] ORS;  Service:  Orthopedics;  Laterality: Left;   TUBAL LIGATION     Social History   Tobacco Use   Smoking status: Former    Current packs/day: 0.00    Average packs/day: 0.3 packs/day for 10.0 years (3.0 ttl pk-yrs)    Types: Cigarettes    Start date: 02/06/1968    Quit date: 02/05/1978    Years since quitting: 45.8    Passive exposure: Past   Smokeless tobacco: Never   Tobacco comments:    over 25 years ago...pt doesnt remember when she quit.   Vaping Use   Vaping status: Never Used  Substance Use Topics   Alcohol  use: No   Drug use: No   Social History   Social History Narrative   Not on file     Immunization History  Administered Date(s) Administered   Fluad Quad(high Dose 65+) 11/03/2021   INFLUENZA, HIGH DOSE SEASONAL PF 10/26/2015, 12/11/2016, 01/06/2018, 10/07/2018, 11/15/2020, 12/17/2022, 11/05/2023   Influenza Split 12/14/2010, 12/06/2012   Influenza Whole 02/06/2004, 12/08/2009   Influenza,inj,Quad PF,6+ Mos 10/15/2013, 11/19/2014   Influenza-Unspecified 10/07/2018   PFIZER(Purple Top)SARS-COV-2 Vaccination  03/01/2019, 03/21/2019, 10/06/2019   Pfizer Covid-19 Vaccine Bivalent Booster 46yrs & up 11/15/2020   Pfizer(Comirnaty)Fall Seasonal Vaccine 12 years and older 11/05/2023   Pneumococcal Conjugate-13 03/18/2013   Pneumococcal Polysaccharide-23 06/30/2008, 10/26/2015   Td 02/05/2005   Tdap 12/14/2010, 11/03/2021   Zoster Recombinant(Shingrix) 11/05/2023     Objective: Vital Signs: BP 109/72 (BP Location: Left Arm, Patient Position: Sitting, Cuff Size: Normal)   Pulse 75   Temp (!) 96.6 F (35.9 C)   Resp 17   Ht 5' 10 (1.778 m)   BMI 38.22 kg/m    Physical Exam Vitals and nursing note reviewed.  Constitutional:      Appearance: She is well-developed.  HENT:     Head: Normocephalic and atraumatic.  Eyes:     Conjunctiva/sclera: Conjunctivae normal.  Cardiovascular:     Rate and Rhythm: Normal rate and regular rhythm.     Heart sounds: Normal heart sounds.  Pulmonary:     Effort: Pulmonary effort is normal.     Breath sounds: Normal breath sounds.  Abdominal:     General: Bowel sounds are normal.     Palpations: Abdomen is soft.  Musculoskeletal:     Cervical back: Normal range of motion.  Lymphadenopathy:     Cervical: No cervical adenopathy.  Skin:    General: Skin is warm and dry.     Capillary Refill: Capillary refill takes less than 2 seconds.  Neurological:     Mental Status: She is alert and oriented to person, place, and time.  Psychiatric:        Behavior: Behavior normal.      Musculoskeletal Exam: Patient remained seated during the exam.  C-spine has severely limited ROM.  Thoracic kyphosis noted. Right shoulder has full ROM.  Left shoulder has limited abduction to 90 degrees.  No tenderness or swelling along the elbow joint line.  Wrist joints have good ROM with no tenderness or synovitis.  PIP and DIP thickening consistent with osteoarthritis of both hands.  Both knee replacements have good ROM.  Lymphedema bilateral LE  CDAI Exam: CDAI Score:  -- Patient Global: --; Provider Global: -- Swollen: --; Tender: -- Joint Exam 11/27/2023   No joint exam has been documented for this visit   There is currently no information documented on the homunculus. Go to the Rheumatology activity and complete the homunculus joint exam.  Investigation:  No additional findings.  Imaging: XR C-ARM NO REPORT Result Date: 11/27/2023 Please see Notes tab for imaging impression.   Recent Labs: Lab Results  Component Value Date   WBC 4.7 06/26/2023   HGB 12.1 06/26/2023   PLT 234 06/26/2023   NA 140 06/26/2023   K 4.0 06/26/2023   CL 103 06/26/2023   CO2 29 06/26/2023   GLUCOSE 77 06/26/2023   BUN 17 06/26/2023   CREATININE 0.95 06/26/2023   BILITOT 0.9 06/26/2023   ALKPHOS 110 09/14/2021   AST 17 06/26/2023   ALT 13 06/26/2023   PROT 6.9 06/26/2023   ALBUMIN 4.2 09/14/2021   CALCIUM  9.7 06/26/2023   GFRAA 98 07/26/2020    Speciality Comments: PLQ eye exam: 03/07/2023  WNL @ [facility] f/u 12 months.  MTX in the past-dcd due to OU, hair loss  Procedures:  No procedures performed Allergies: Penicillins   Assessment / Plan:     Visit Diagnoses: Autoimmune disease - +ANA, +RNP, history of inflammatory arthritis: She has not had any signs or symptoms of a flare.  She has clinically been doing well taking Plaquenil  200 mg 1 tablet by mouth twice daily.  She is tolerating Plaquenil  without any side effects and has not had any gaps in therapy.  She continues to have chronic fatigue, sicca symptoms, and joint stiffness.  She had no synovitis on examination today.  No recent rashes, increased photosensitivity, or symptoms of Raynaud's phenomenon.  No oral or nasal ulcerations. Lab work from 01/15/23 was reviewed today in the office: ESR WNL, complements WNL, and dsDNA negative.  Plan to update the following lab work today.  She will remain on Plaquenil  as prescribed.  She was advised to notify us  if she develops any signs or symptoms of a  flare.  She will follow-up in the office in 5 months or sooner if needed. - Plan: CBC with Differential/Platelet, Comprehensive metabolic panel with GFR, Protein / creatinine ratio, urine, C3 and C4, Sedimentation rate, ANA, Anti-DNA antibody, double-stranded, RNP Antibody, hydroxychloroquine  (PLAQUENIL ) 200 MG tablet  High risk medication use - Plaquenil  200 mg 1 tablet by mouth twice daily. PLQ eye exam: 03/07/2023.  PLQ eye exam: 03/07/2023 WNL @ [facility] f/u 12 months.  CBC and CMP updated on 06/26/23.  Orders for CBC and CMP released today.   - Plan: CBC with Differential/Platelet, Comprehensive metabolic panel with GFR  Chronic left shoulder pain: Limited abduction to about 90 degrees.  Limited and painful internal rotation.  Primary osteoarthritis of both hands: She has PIP and DIP thickening consistent with osteoarthritis of both hands.  Subluxation of several DIP joints noted.  Discussed the importance of joint protection and muscle strengthening.  She has noticed some decreased grip strength.  Patient was given a handout of exercises to perform.  Carpal tunnel syndrome, right upper limb: Intermittent paresthesias, especially while taking on the phone.  S/P total left hip arthroplasty - Revision February 2022 by Dr. Vernetta. No groin pain currently.    H/O total knee replacement, bilateral: Doing well.  She has good range of motion of both knee replacements.   DDD (degenerative disc disease), cervical - Severe multilevel spinal stenosis>>s/p anterior cervical fusion-performed by Dr. Barbarann on 06/11/22.  C-spine has limited ROM with lateral rotation.  She continues to have chronic pain and required an injection today which was tolerated.  Degeneration of intervertebral disc of lumbar region without discogenic back pain or lower extremity pain: No symptoms of radiculopathy.  Other medical conditions are listed as follows:   Vitamin D  deficiency  Lymphedema: Bilateral LE  History  of glaucoma  History of macular degeneration  History of depression  History of sleep apnea   Orders: Orders Placed This Encounter  Procedures   CBC with Differential/Platelet   Comprehensive metabolic panel with GFR   Protein / creatinine ratio, urine   C3 and C4   Sedimentation rate   ANA   Anti-DNA antibody, double-stranded   RNP Antibody   Meds ordered this encounter  Medications   hydroxychloroquine  (PLAQUENIL ) 200 MG tablet    Sig: Take 1 tablet (200 mg total) by mouth 2 (two) times daily.    Dispense:  180 tablet    Refill:  0     Follow-Up Instructions: Return in about 5 months (around 04/26/2024) for Autoimmune Disease, Osteoarthritis.   Waddell CHRISTELLA Craze, PA-C  Note - This record has been created using Dragon software.  Chart creation errors have been sought, but may not always  have been located. Such creation errors do not reflect on  the standard of medical care.

## 2023-11-27 ENCOUNTER — Ambulatory Visit: Admitting: Adult Health

## 2023-11-27 ENCOUNTER — Ambulatory Visit: Attending: Physician Assistant | Admitting: Physician Assistant

## 2023-11-27 ENCOUNTER — Encounter: Payer: Self-pay | Admitting: Physician Assistant

## 2023-11-27 ENCOUNTER — Ambulatory Visit: Payer: Self-pay

## 2023-11-27 ENCOUNTER — Ambulatory Visit: Admitting: Physical Medicine and Rehabilitation

## 2023-11-27 VITALS — BP 130/84 | HR 80

## 2023-11-27 VITALS — BP 109/72 | HR 75 | Temp 96.6°F | Resp 17 | Ht 70.0 in | Wt 263.0 lb

## 2023-11-27 DIAGNOSIS — Z79899 Other long term (current) drug therapy: Secondary | ICD-10-CM | POA: Diagnosis not present

## 2023-11-27 DIAGNOSIS — M47812 Spondylosis without myelopathy or radiculopathy, cervical region: Secondary | ICD-10-CM | POA: Diagnosis not present

## 2023-11-27 DIAGNOSIS — M359 Systemic involvement of connective tissue, unspecified: Secondary | ICD-10-CM

## 2023-11-27 DIAGNOSIS — M19041 Primary osteoarthritis, right hand: Secondary | ICD-10-CM | POA: Diagnosis not present

## 2023-11-27 DIAGNOSIS — M25512 Pain in left shoulder: Secondary | ICD-10-CM

## 2023-11-27 DIAGNOSIS — Z8669 Personal history of other diseases of the nervous system and sense organs: Secondary | ICD-10-CM

## 2023-11-27 DIAGNOSIS — Z96653 Presence of artificial knee joint, bilateral: Secondary | ICD-10-CM

## 2023-11-27 DIAGNOSIS — M19042 Primary osteoarthritis, left hand: Secondary | ICD-10-CM

## 2023-11-27 DIAGNOSIS — G5601 Carpal tunnel syndrome, right upper limb: Secondary | ICD-10-CM

## 2023-11-27 DIAGNOSIS — Z96642 Presence of left artificial hip joint: Secondary | ICD-10-CM

## 2023-11-27 DIAGNOSIS — M51369 Other intervertebral disc degeneration, lumbar region without mention of lumbar back pain or lower extremity pain: Secondary | ICD-10-CM

## 2023-11-27 DIAGNOSIS — M5030 Other cervical disc degeneration, unspecified cervical region: Secondary | ICD-10-CM

## 2023-11-27 DIAGNOSIS — I890 Lymphedema, not elsewhere classified: Secondary | ICD-10-CM

## 2023-11-27 DIAGNOSIS — E559 Vitamin D deficiency, unspecified: Secondary | ICD-10-CM

## 2023-11-27 DIAGNOSIS — G8929 Other chronic pain: Secondary | ICD-10-CM

## 2023-11-27 DIAGNOSIS — G4733 Obstructive sleep apnea (adult) (pediatric): Secondary | ICD-10-CM

## 2023-11-27 DIAGNOSIS — Z8659 Personal history of other mental and behavioral disorders: Secondary | ICD-10-CM

## 2023-11-27 MED ORDER — METHYLPREDNISOLONE ACETATE 40 MG/ML IJ SUSP
40.0000 mg | Freq: Once | INTRAMUSCULAR | Status: AC
  Administered 2023-11-27: 40 mg

## 2023-11-27 MED ORDER — HYDROXYCHLOROQUINE SULFATE 200 MG PO TABS
200.0000 mg | ORAL_TABLET | Freq: Two times a day (BID) | ORAL | 0 refills | Status: AC
Start: 2023-11-27 — End: ?

## 2023-11-27 NOTE — Progress Notes (Signed)
 Taylor Mccall - 78 y.o. female MRN [id number]  Date of birth: 15-Aug-1945  Office Visit Note: Visit Date: 11/27/2023 PCP: Merna Huxley, NP Referred by: Merna Huxley, NP  Subjective: Chief Complaint  Patient presents with   Neck - Pain   HPI:  Taylor Mccall is a 78 y.o. female who comes in today at the request of Latrish Mogel, FNP for planned Right  C2-3 Cervical facet/medial branch block with fluoroscopic guidance.  The patient has failed conservative care including home exercise, medications, time and activity modification.  This injection will be diagnostic and hopefully therapeutic.  Please see requesting physician notes for further details and justification.  Exam has shown concordant pain with facet joint loading.   ROS Otherwise per HPI.  Assessment & Plan: Visit Diagnoses:    ICD-10-CM   1. Cervical spondylosis without myelopathy  M47.812 XR C-ARM NO REPORT    Facet Injection    methylPREDNISolone  acetate (DEPO-MEDROL ) injection 40 mg      Plan: No additional findings.   Meds & Orders:  Meds ordered this encounter  Medications   methylPREDNISolone  acetate (DEPO-MEDROL ) injection 40 mg    Orders Placed This Encounter  Procedures   Facet Injection   XR C-ARM NO REPORT    Follow-up: No follow-ups on file.   Procedures: No procedures performed  Cervical Facet Joint Intra-Articular Injection with Fluoroscopic Guidance  Patient: Taylor Mccall      Date of Birth: [birth year] MRN: [id number] PCP: Merna Huxley, NP      Visit Date: 11/27/2023   Universal Protocol:    Date/Time: 10/22/251:24 PM  Consent Given By: the patient  Position: lateral  Additional Comments: Vital signs were monitored before and after the procedure. Patient was prepped and draped in the usual sterile fashion. The correct patient, procedure, and site was verified.   Injection Procedure Details:  Procedure Site One Meds Administered:  Meds ordered this encounter   Medications   methylPREDNISolone  acetate (DEPO-MEDROL ) injection 40 mg     Laterality: Right  Location/Site:  C2-3  Needle size: 25 G  Needle type: spinal needle  Needle Placement: Articular  Findings:  -Contrast Used: 0.5 mL iohexol 180 mg iodine /mL   -Comments: Excellent flow of contrast producing a partial arthrogram.  Procedure Details: The fluoroscope beam was manipulated to achieve the best "true" lateral view possible by squaring off the endplates with cranial and caudal tilt and using varying obliquity to achieve the a view with the longest length of spinous process.   The region overlying the facet joints mentioned above were then localized under fluoroscopic visualization. For each target described below the skin was anesthetized with 1 ml of 1% Lidocaine  without epinephrine . The needle was inserted down to the level of the lateral mass of the superior articular process of the  facet joint to be injected. Then, the needle was walked off inferiorly into the lateral aspect of the facet joint. Bi-planar images were used for confirming placement and spot radiographs were documented. Radiographs were obtained of the arthrogram. A 0.5 ml. volume of the steroid/anesthetic solution was injected into the joint. This procedure was repeated for each facet joint injected.   Additional Comments:  The patient tolerated the procedure well Dressing: Band-Aid    Post-procedure details: Patient was observed during the procedure. Post-procedure instructions were reviewed.  Patient left the clinic in stable condition.    Clinical History: Narrative & Impression CLINICAL DATA:  Neck pain, chronic and acute. Prior cervical surgery. Pain in  the feet and hands over the last year.   EXAM: MRI CERVICAL SPINE WITHOUT CONTRAST   TECHNIQUE: Multiplanar, multisequence MR imaging of the cervical spine was performed. No intravenous contrast was administered.   COMPARISON:  Radiography  08/14/2022.  MRI 04/25/2022   FINDINGS: Alignment: Straightening of the normal cervical lordosis. Mild kyphotic curvature.   Vertebrae: Previous ACDF C3 through C6. Interbody spacer at the C3-4 level. Chronic fusion at the C4-5 level, pre dating the recent surgery.   Cord: No cord compression or focal cord lesion.   Posterior Fossa, vertebral arteries, paraspinal tissues: No visible intracranial finding of significance.   Disc levels:   Pronounced degenerative change at the C1-2 articulation. Narrowing of the upper spinal canal but no compressive effect upon the cord. These degenerative changes could be a cause regional pain. These are worse on the right than the left.   C2-3: Endplate osteophytes covering bulging disc material, centrally predominant. Effacement of the ventral subarachnoid space in the midline but no frank compression of the cord. Ample subarachnoid space present dorsal to the cord. Foramina widely patent.   C3 through C6: Previous ACDF. No complicating feature is seen. Sufficient patency of the canal and foramina through that segment. As noted above, findings at C4-5 are chronic.   C6-7: Chronic disc degeneration with loss of disc height. Question fusion across this disc space. No compressive canal stenosis. Foramina appear sufficiently patent.   C7-T1: Facet arthropathy worse on the left than the right. No compressive canal stenosis. Foraminal narrowing on the left that could affect the C8 nerve.   IMPRESSION: 1. Previous ACDF C3 through C6. No complicating feature is seen. Sufficient patency of the canal and foramina through that segment. 2. Pronounced degenerative change at the C1-2 articulation, worse on the right than the left. Narrowing of the upper spinal canal but no compressive effect upon the cord. These degenerative changes could be a cause of regional pain. 3. C2-3: Endplate osteophytes covering bulging disc material, centrally predominant.  Effacement of the ventral subarachnoid space in the midline but no frank compression of the cord. Ample subarachnoid space present dorsal to the cord. Foramina widely patent. 4. C6-7: Chronic disc degeneration with loss of disc height. Question fusion across this disc space. No compressive canal stenosis. Foramina appear sufficiently patent. 5. C7-T1: Facet arthropathy worse on the left than the right. Foraminal narrowing on the left that could affect the C8 nerve.     Electronically Signed   By: Oneil Officer M.D.   On: 11/02/2022 09:04     Objective:  VS:  HT:    WT:   BMI:     BP:130/84  HR:80bpm  TEMP: ( )  RESP:  Physical Exam Vitals and nursing note reviewed.  Constitutional:      General: She is not in acute distress.    Appearance: Normal appearance. She is not ill-appearing.  HENT:     Head: Normocephalic and atraumatic.     Right Ear: External ear normal.     Left Ear: External ear normal.  Eyes:     Extraocular Movements: Extraocular movements intact.  Cardiovascular:     Rate and Rhythm: Normal rate.     Pulses: Normal pulses.  Musculoskeletal:     Cervical back: Tenderness present. No rigidity.     Right lower leg: No edema.     Left lower leg: No edema.     Comments: Patient has good strength in the upper extremities including 5 out of 5  strength in wrist extension long finger flexion and APB.  There is no atrophy of the hands intrinsically.  There is a negative Hoffmann's test.   Lymphadenopathy:     Cervical: No cervical adenopathy.  Skin:    Findings: No erythema, lesion or rash.  Neurological:     General: No focal deficit present.     Mental Status: She is alert and oriented to person, place, and time.     Sensory: No sensory deficit.     Motor: No weakness or abnormal muscle tone.     Coordination: Coordination normal.  Psychiatric:        Mood and Affect: Mood normal.        Behavior: Behavior normal.      Imaging: No results found.

## 2023-11-27 NOTE — Procedures (Signed)
 Cervical Facet Joint Intra-Articular Injection with Fluoroscopic Guidance  Patient: Taylor Mccall      Date of Birth: 12/31/1945 MRN: [id number] PCP: Merna Huxley, NP      Visit Date: 11/27/2023   Universal Protocol:    Date/Time: 10/22/251:24 PM  Consent Given By: the patient  Position: lateral  Additional Comments: Vital signs were monitored before and after the procedure. Patient was prepped and draped in the usual sterile fashion. The correct patient, procedure, and site was verified.   Injection Procedure Details:  Procedure Site One Meds Administered:  Meds ordered this encounter  Medications   methylPREDNISolone  acetate (DEPO-MEDROL ) injection 40 mg     Laterality: Right  Location/Site:  C2-3  Needle size: 25 G  Needle type: spinal needle  Needle Placement: Articular  Findings:  -Contrast Used: 0.5 mL iohexol 180 mg iodine /mL   -Comments: Excellent flow of contrast producing a partial arthrogram.  Procedure Details: The fluoroscope beam was manipulated to achieve the best "true" lateral view possible by squaring off the endplates with cranial and caudal tilt and using varying obliquity to achieve the a view with the longest length of spinous process.   The region overlying the facet joints mentioned above were then localized under fluoroscopic visualization. For each target described below the skin was anesthetized with 1 ml of 1% Lidocaine  without epinephrine . The needle was inserted down to the level of the lateral mass of the superior articular process of the  facet joint to be injected. Then, the needle was walked off inferiorly into the lateral aspect of the facet joint. Bi-planar images were used for confirming placement and spot radiographs were documented. Radiographs were obtained of the arthrogram. A 0.5 ml. volume of the steroid/anesthetic solution was injected into the joint. This procedure was repeated for each facet joint  injected.   Additional Comments:  The patient tolerated the procedure well Dressing: Band-Aid    Post-procedure details: Patient was observed during the procedure. Post-procedure instructions were reviewed.  Patient left the clinic in stable condition.

## 2023-11-27 NOTE — Patient Instructions (Signed)

## 2023-11-27 NOTE — Progress Notes (Signed)
 Pain Scale   Average Pain 5 Patient advising she has chronic neck pain radiating bilaterally to shoulders.        +Driver, -BT, -Dye Allergies.

## 2023-11-28 ENCOUNTER — Ambulatory Visit: Payer: Self-pay | Admitting: Physician Assistant

## 2023-11-28 NOTE — Progress Notes (Signed)
 CBC and CMP WNL.  ESR WNL Urine protein creatinine ratio WNL Complements WNL

## 2023-11-29 ENCOUNTER — Other Ambulatory Visit: Payer: Self-pay | Admitting: Adult Health

## 2023-11-29 DIAGNOSIS — E785 Hyperlipidemia, unspecified: Secondary | ICD-10-CM

## 2023-11-29 LAB — CBC WITH DIFFERENTIAL/PLATELET
Absolute Lymphocytes: 1145 {cells}/uL (ref 850–3900)
Absolute Monocytes: 419 {cells}/uL (ref 200–950)
Basophils Absolute: 59 {cells}/uL (ref 0–200)
Basophils Relative: 1 %
Eosinophils Absolute: 271 {cells}/uL (ref 15–500)
Eosinophils Relative: 4.6 %
HCT: 37.1 % (ref 35.0–45.0)
Hemoglobin: 12.1 g/dL (ref 11.7–15.5)
MCH: 29 pg (ref 27.0–33.0)
MCHC: 32.6 g/dL (ref 32.0–36.0)
MCV: 89 fL (ref 80.0–100.0)
MPV: 11.2 fL (ref 7.5–12.5)
Monocytes Relative: 7.1 %
Neutro Abs: 4006 {cells}/uL (ref 1500–7800)
Neutrophils Relative %: 67.9 %
Platelets: 244 Thousand/uL (ref 140–400)
RBC: 4.17 Million/uL (ref 3.80–5.10)
RDW: 11.8 % (ref 11.0–15.0)
Total Lymphocyte: 19.4 %
WBC: 5.9 Thousand/uL (ref 3.8–10.8)

## 2023-11-29 LAB — COMPREHENSIVE METABOLIC PANEL WITH GFR
AG Ratio: 1.6 (calc) (ref 1.0–2.5)
ALT: 8 U/L (ref 6–29)
AST: 13 U/L (ref 10–35)
Albumin: 4 g/dL (ref 3.6–5.1)
Alkaline phosphatase (APISO): 92 U/L (ref 37–153)
BUN: 14 mg/dL (ref 7–25)
CO2: 29 mmol/L (ref 20–32)
Calcium: 9.5 mg/dL (ref 8.6–10.4)
Chloride: 107 mmol/L (ref 98–110)
Creat: 0.71 mg/dL (ref 0.60–0.95)
Globulin: 2.5 g/dL (ref 1.9–3.7)
Glucose, Bld: 87 mg/dL (ref 65–99)
Potassium: 4 mmol/L (ref 3.5–5.3)
Sodium: 141 mmol/L (ref 135–146)
Total Bilirubin: 0.7 mg/dL (ref 0.2–1.2)
Total Protein: 6.5 g/dL (ref 6.1–8.1)
eGFR: 86 mL/min/1.73m2 (ref >=60)

## 2023-11-29 LAB — C3 AND C4
C3 Complement: 124 mg/dL (ref 83–193)
C4 Complement: 25 mg/dL (ref 15–57)

## 2023-11-29 LAB — ANA: Anti Nuclear Antibody (ANA): POSITIVE — AB

## 2023-11-29 LAB — ANTI-NUCLEAR AB-TITER (ANA TITER)
ANA TITER: 1:80 {titer} — ABNORMAL HIGH
ANA Titer 1: 1:80 {titer} — ABNORMAL HIGH

## 2023-11-29 LAB — ANTI-DNA ANTIBODY, DOUBLE-STRANDED: ds DNA Ab: 1 [IU]/mL

## 2023-11-29 LAB — SEDIMENTATION RATE: Sed Rate: 6 mm/h (ref 0–30)

## 2023-11-29 LAB — PROTEIN / CREATININE RATIO, URINE
Creatinine, Urine: 143 mg/dL (ref 20–275)
Protein/Creat Ratio: 70 mg/g{creat} (ref 24–184)
Protein/Creatinine Ratio: 0.07 mg/mg{creat} (ref 0.024–0.184)
Total Protein, Urine: 10 mg/dL (ref 5–24)

## 2023-11-29 LAB — RNP ANTIBODY: Ribonucleic Protein(ENA) Antibody, IgG: >8 AI — AB

## 2023-12-01 NOTE — Progress Notes (Signed)
 ANA and RNP remain positive.   dsDNA is negative.  Labs are not consistent with a flare

## 2023-12-09 ENCOUNTER — Telehealth: Payer: Self-pay

## 2023-12-09 ENCOUNTER — Encounter: Payer: Self-pay | Admitting: Radiology

## 2023-12-09 NOTE — Telephone Encounter (Signed)
 Looks like it is pending signature from Dr. Neysa

## 2023-12-09 NOTE — Telephone Encounter (Signed)
 Copied from CRM [id number]. Topic: Clinical - Lab/Test Results >> Dec 06, 2023  1:57 PM Benton KIDD wrote: Reason for CRM: patient is wanting a call when her sleep results come in . Didn't see any appointments available until January to discuss follow up and results . Patient is wanting to speak before then once results have came in  [phone]    Tried to reach out to PT VM/ LM - I do not see where the HST  was schedule or done  forward to [facility] to advise.

## 2023-12-09 NOTE — Telephone Encounter (Signed)
 SNAP HST has been read. Study ws ordered by TP and she can respond to patient questions

## 2023-12-13 ENCOUNTER — Ambulatory Visit: Payer: Self-pay | Admitting: Adult Health

## 2023-12-19 ENCOUNTER — Encounter: Payer: Self-pay | Admitting: Adult Health

## 2023-12-19 ENCOUNTER — Ambulatory Visit: Admitting: Adult Health

## 2023-12-19 VITALS — BP 144/76 | HR 73 | Ht 70.0 in | Wt 265.4 lb

## 2023-12-19 DIAGNOSIS — Z6838 Body mass index (BMI) 38.0-38.9, adult: Secondary | ICD-10-CM

## 2023-12-19 DIAGNOSIS — I890 Lymphedema, not elsewhere classified: Secondary | ICD-10-CM

## 2023-12-19 DIAGNOSIS — G4733 Obstructive sleep apnea (adult) (pediatric): Secondary | ICD-10-CM | POA: Diagnosis not present

## 2023-12-19 DIAGNOSIS — I872 Venous insufficiency (chronic) (peripheral): Secondary | ICD-10-CM

## 2023-12-19 NOTE — Progress Notes (Signed)
 @Patient  ID: Taylor Mccall, female    DOB: 1945/08/27, 78 y.o.   MRN: [id number]  Chief Complaint  Patient presents with   Follow-up    HST f/u    Referring provider: Merna Huxley, NP  HPI: 78 year old female seen for sleep consult October 14, 2023 to reestablish for sleep apnea Medical history significant for rheumatoid arthritis and lymphedema    TEST/EVENTS : Reviewed 12/19/2023  NPSG 2009:  AHI 21/hr.  Auto 2014:  Optimal pressure 15cm   Discussed the use of AI scribe software for clinical note transcription with the patient, who gave verbal consent to proceed.  History of Present Illness Taylor Mccall is an 78 year old female with sleep apnea who presents for follow-up regarding her sleep study results.  She has been off CPAP therapy for few years due to neck issues.  Patient was seen last visit for sleep consult for evaluation of ongoing sleep apnea symptoms with snoring and daytime sleepiness.  Patient was set up for a home sleep study that was done on November 27, 2023 that showed AHI 10.5/hour.  SpO2 low at 80%.  We discussed her sleep study results in detail.  Patient continues to have significant ongoing symptoms.  She would like to restart CPAP therapy.    She has been off of CPAP for a few years.  She would like to begin CPAP to help with her symptom management as she felt better when she used it previously. She inquires about the process of obtaining a new CPAP machine and supplies, mentioning that she previously used a full face mask. She prefers handling the process via phone and mail due to transportation issues.       Allergies  Allergen Reactions   Penicillins Rash    Immunization History  Administered Date(s) Administered   Fluad Quad(high Dose 65+) 11/03/2021   INFLUENZA, HIGH DOSE SEASONAL PF 10/26/2015, 12/11/2016, 01/06/2018, 10/07/2018, 11/15/2020, 12/17/2022, 11/05/2023   Influenza Split 12/14/2010, 12/06/2012   Influenza Whole  02/06/2004, 12/08/2009   Influenza,inj,Quad PF,6+ Mos 10/15/2013, 11/19/2014   Influenza-Unspecified 10/07/2018   PFIZER(Purple Top)SARS-COV-2 Vaccination 03/01/2019, 03/21/2019, 10/06/2019   Pfizer Covid-19 Vaccine Bivalent Booster 15yrs & up 11/15/2020   Pfizer(Comirnaty)Fall Seasonal Vaccine 12 years and older 11/05/2023   Pneumococcal Conjugate-13 03/18/2013   Pneumococcal Polysaccharide-23 06/30/2008, 10/26/2015   Td 02/05/2005   Tdap 12/14/2010, 11/03/2021   Zoster Recombinant(Shingrix) 11/05/2023    Past Medical History:  Diagnosis Date   Allergy    Anxiety    Arthritis    Blood transfusion without reported diagnosis    Breast mass, left    Cataract    Depression    Difficult intubation 04/01/2020   Known difficult airway from previous records   Gait disturbance    GERD (gastroesophageal reflux disease)    Glaucoma    Hypertension    Lymphedema of both lower extremities    Seeing OT at [facility] - dreeeing to Left Lower leg   Obesity    Rheumatoid arthritis(714.0)    Sleep apnea    cpap   Urinary incontinence    Venous insufficiency     Tobacco History: Social History   Tobacco Use  Smoking Status Former   Current packs/day: 0.00   Average packs/day: 0.3 packs/day for 10.0 years (3.0 ttl pk-yrs)   Types: Cigarettes   Start date: 02/06/1968   Quit date: 02/05/1978   Years since quitting: 45.8   Passive exposure: Past  Smokeless Tobacco Never  Tobacco Comments  over 25 years ago...pt doesnt remember when she quit.    Counseling given: Not Answered Tobacco comments: over 25 years ago...pt doesnt remember when she quit.    Outpatient Medications Prior to Visit  Medication Sig Dispense Refill   Acetaminophen  (TYLENOL  PO) Take by mouth as needed.     ALPRAZolam  (XANAX ) 0.25 MG tablet Take 1 tablet (0.25 mg total) by mouth 2 (two) times daily as needed for anxiety. 20 tablet 2   aspirin  EC 81 MG tablet Take 1 tablet (81 mg total) by mouth 2  (two) times daily. Swallow whole. (Patient taking differently: Take 81 mg by mouth daily. Swallow whole.) 30 tablet 11   brimonidine  (ALPHAGAN ) 0.2 % ophthalmic solution Place 1 drop into both eyes 2 (two) times daily.     Cholecalciferol  (VITAMIN D3) 125 MCG (5000 UT) CHEW Chew 10,000 Units by mouth daily.     Cyanocobalamin  (VITAMIN B-12 PO) Take 6,000 mcg by mouth daily. Vitamin B-12 3000 mcg chew     furosemide  (LASIX ) 20 MG tablet Take 1 tablet (20 mg total) by mouth 2 (two) times daily. 180 tablet 3   hydroxychloroquine  (PLAQUENIL ) 200 MG tablet Take 1 tablet (200 mg total) by mouth 2 (two) times daily. 180 tablet 0   KLOR-CON  M20 20 MEQ tablet TAKE 1 TABLET BY MOUTH DAILY 90 tablet 1   latanoprost  (XALATAN ) 0.005 % ophthalmic solution Place 1 drop into both eyes at bedtime.     losartan  (COZAAR ) 25 MG tablet TAKE 1 TABLET (25 MG TOTAL) BY MOUTH DAILY. KEEP APPT FOR REFILLS 90 tablet 0   Multiple Vitamins-Minerals (MULTIVITAMIN GUMMIES WOMENS) CHEW Chew 2 each by mouth daily.     Omega-3 Fatty Acids (FISH OIL) 1200 MG CAPS Take 2,400 mg by mouth daily.     rosuvastatin  (CRESTOR ) 5 MG tablet TAKE 1 TABLET BY MOUTH EVERYDAY AT BEDTIME 90 tablet 0   sertraline  (ZOLOFT ) 100 MG tablet TAKE 1 TABLET BY MOUTH EVERY DAY IN THE MORNING 90 tablet 0   timolol  (TIMOPTIC ) 0.5 % ophthalmic solution Place 1 drop into both eyes 2 (two) times daily.     vitamin E  180 MG (400 UNITS) capsule Take 400 Units by mouth daily.     Polyethyl Glycol-Propyl Glycol (SYSTANE OP) Place 1 drop into both eyes every 6 (six) hours as needed (dry eyes).     No facility-administered medications prior to visit.     Review of Systems:   Constitutional:   No  weight loss, night sweats,  Fevers, chills, +fatigue, or  lassitude.  HEENT:   No headaches,  Difficulty swallowing,  Tooth/dental problems, or  Sore throat,                No sneezing, itching, ear ache, nasal congestion, post nasal drip,   CV:  No chest pain,   Orthopnea, PND, swelling in lower extremities, anasarca, dizziness, palpitations, syncope.   GI  No heartburn, indigestion, abdominal pain, nausea, vomiting, diarrhea, change in bowel habits, loss of appetite, bloody stools.   Resp:   No chest wall deformity  Skin: no rash or lesions.  GU: no dysuria, change in color of urine, no urgency or frequency.  No flank pain, no hematuria   MS:  No joint pain or swelling.  No decreased range of motion.  No back pain.    Physical Exam  BP (!) 144/76   Pulse 73   Ht 5' 10 (1.778 m) Comment: Per pt  Wt 265 lb 6.4 oz (  120.4 kg)   SpO2 97% Comment: RA  BMI 38.08 kg/m   GEN: A/Ox3; pleasant , NAD, well nourished    HEENT:  [place]/AT,  NOSE-clear, THROAT-clear, no lesions, no postnasal drip or exudate noted.   NECK:  Supple w/ fair ROM; no JVD; normal carotid impulses w/o bruits; no thyromegaly or nodules palpated; no lymphadenopathy.    RESP  Clear  P & A; w/o, wheezes/ rales/ or rhonchi. no accessory muscle use, no dullness to percussion  CARD:  RRR, no m/r/g, 1+ peripheral edema, pulses intact, no cyanosis or clubbing.  GI:   Soft & nt; nml bowel sounds; no organomegaly or masses detected.   Musco: Warm bil, no deformities or joint swelling noted.   Neuro: alert, no focal deficits noted.    Skin: Warm, no lesions or rashes    Lab Results:Reviewed 12/19/2023   CBC    Component Value Date/Time   WBC 5.9 11/27/2023 1526   RBC 4.17 11/27/2023 1526   HGB 12.1 11/27/2023 1526   HCT 37.1 11/27/2023 1526   PLT 244 11/27/2023 1526   MCV 89.0 11/27/2023 1526   MCH 29.0 11/27/2023 1526   MCHC 32.6 11/27/2023 1526   RDW 11.8 11/27/2023 1526   LYMPHSABS 1,260 08/15/2022 1407   MONOABS 0.4 09/14/2021 0929   EOSABS 271 11/27/2023 1526   BASOSABS 59 11/27/2023 1526    BMET    Component Value Date/Time   NA 141 11/27/2023 1526   K 4.0 11/27/2023 1526   CL 107 11/27/2023 1526   CO2 29 11/27/2023 1526   GLUCOSE 87 11/27/2023 1526    BUN 14 11/27/2023 1526   CREATININE 0.71 11/27/2023 1526   CALCIUM  9.5 11/27/2023 1526   GFRNONAA >60 06/01/2022 1505   GFRNONAA 84 07/26/2020 1000   GFRAA 98 07/26/2020 1000    BNP No results found for: BNP  ProBNP    Component Value Date/Time   PROBNP 27.0 07/25/2017 1224    Imaging: XR C-ARM NO REPORT Result Date: 11/27/2023 Please see Notes tab for imaging impression.         No data to display          No results found for: NITRICOXIDE     10/14/2023    1:00 PM  Results of the [facility] flowsheet  Sitting and reading 3  Watching TV 3  Sitting, inactive in a public place (e.g. a theatre or a meeting) 2  As a passenger in a car for an hour without a break 2  Lying down to rest in the afternoon when circumstances permit 2  Sitting and talking to someone 0  Sitting quietly after a lunch without alcohol  0  In a car, while stopped for a few minutes in traffic 0  Total score 12        Assessment & Plan:   Assessment and Plan Assessment & Plan Obstructive sleep apnea  -not obstructive sleep apnea with significant symptom burden. She has mild obstructive sleep apnea with 10-11 episodes per hour and oxygen desaturation into the low 80s, causing fatigue and low energy. Previous CPAP use was beneficial, and she is open to restarting therapy. Ordered a CPAP machine and supplies through an insurance-contracted DME company. Instructed her to request mail delivery of supplies if transportation if able.  Advised using CPAP for more than 4 hours per night, ideally all night. Instructed to clean equipment regularly with distilled water  and avoid auto cleaners. Scheduled a follow-up video visit in two months to assess  compliance and effectiveness. - discussed how weight can impact sleep and risk for sleep disordered breathing - discussed options to assist with weight loss: combination of diet modification, cardiovascular and strength training exercises   - had an  extensive discussion regarding the adverse health consequences related to untreated sleep disordered breathing - specifically discussed the risks for hypertension, coronary artery disease, cardiac dysrhythmias, cerebrovascular disease, and diabetes - lifestyle modification discussed   - discussed how sleep disruption can increase risk of accidents, particularly when driving - safe driving practices were discussed    Lymphedema  -lower extremity appears stable. Encouraged weight loss and physical activity to manage lymphedema.  Support stockings as able  Obesity 38 BMI-continue with healthy weight loss  Plan  Patient Instructions  Begin CPAP At bedtime, wear all night long.  Work on healthy weight loss.  Follow up in 2 months for Friday virtual clinic.            Madelin Stank, NP 12/19/2023

## 2023-12-19 NOTE — Patient Instructions (Addendum)
 Begin CPAP At bedtime, wear all night long.  Work on healthy weight loss.  Follow up in 2 months for Friday virtual clinic.

## 2024-02-04 ENCOUNTER — Ambulatory Visit: Payer: Self-pay | Admitting: Adult Health

## 2024-02-04 ENCOUNTER — Encounter: Payer: Self-pay | Admitting: Adult Health

## 2024-02-04 ENCOUNTER — Ambulatory Visit (INDEPENDENT_AMBULATORY_CARE_PROVIDER_SITE_OTHER): Payer: Medicare HMO | Admitting: Adult Health

## 2024-02-04 VITALS — BP 110/80 | HR 79 | Temp 98.0°F | Ht 70.0 in | Wt 270.0 lb

## 2024-02-04 DIAGNOSIS — E785 Hyperlipidemia, unspecified: Secondary | ICD-10-CM

## 2024-02-04 DIAGNOSIS — M359 Systemic involvement of connective tissue, unspecified: Secondary | ICD-10-CM

## 2024-02-04 DIAGNOSIS — Z0000 Encounter for general adult medical examination without abnormal findings: Secondary | ICD-10-CM

## 2024-02-04 DIAGNOSIS — F331 Major depressive disorder, recurrent, moderate: Secondary | ICD-10-CM

## 2024-02-04 DIAGNOSIS — G4733 Obstructive sleep apnea (adult) (pediatric): Secondary | ICD-10-CM | POA: Diagnosis not present

## 2024-02-04 DIAGNOSIS — I890 Lymphedema, not elsewhere classified: Secondary | ICD-10-CM

## 2024-02-04 DIAGNOSIS — I10 Essential (primary) hypertension: Secondary | ICD-10-CM

## 2024-02-04 DIAGNOSIS — F419 Anxiety disorder, unspecified: Secondary | ICD-10-CM | POA: Diagnosis not present

## 2024-02-04 LAB — CBC WITH DIFFERENTIAL/PLATELET
Basophils Absolute: 0.1 K/uL (ref 0.0–0.1)
Basophils Relative: 1.2 % (ref 0.0–3.0)
Eosinophils Absolute: 0.4 K/uL (ref 0.0–0.7)
Eosinophils Relative: 6.4 % — ABNORMAL HIGH (ref 0.0–5.0)
HCT: 35.9 % — ABNORMAL LOW (ref 36.0–46.0)
Hemoglobin: 12.1 g/dL (ref 12.0–15.0)
Lymphocytes Relative: 22.4 % (ref 12.0–46.0)
Lymphs Abs: 1.3 K/uL (ref 0.7–4.0)
MCHC: 33.6 g/dL (ref 30.0–36.0)
MCV: 87.5 fl (ref 78.0–100.0)
Monocytes Absolute: 0.5 K/uL (ref 0.1–1.0)
Monocytes Relative: 8.9 % (ref 3.0–12.0)
Neutro Abs: 3.5 K/uL (ref 1.4–7.7)
Neutrophils Relative %: 61.1 % (ref 43.0–77.0)
Platelets: 231 K/uL (ref 150.0–400.0)
RBC: 4.11 Mil/uL (ref 3.87–5.11)
RDW: 13.4 % (ref 11.5–15.5)
WBC: 5.7 K/uL (ref 4.0–10.5)

## 2024-02-04 LAB — COMPREHENSIVE METABOLIC PANEL WITH GFR
ALT: 9 U/L (ref 3–35)
AST: 15 U/L (ref 5–37)
Albumin: 4 g/dL (ref 3.5–5.2)
Alkaline Phosphatase: 92 U/L (ref 39–117)
BUN: 14 mg/dL (ref 6–23)
CO2: 30 meq/L (ref 19–32)
Calcium: 9.3 mg/dL (ref 8.4–10.5)
Chloride: 107 meq/L (ref 96–112)
Creatinine, Ser: 0.66 mg/dL (ref 0.40–1.20)
GFR: 82.9 mL/min
Glucose, Bld: 71 mg/dL (ref 70–99)
Potassium: 3.7 meq/L (ref 3.5–5.1)
Sodium: 143 meq/L (ref 135–145)
Total Bilirubin: 0.7 mg/dL (ref 0.2–1.2)
Total Protein: 7.1 g/dL (ref 6.0–8.3)

## 2024-02-04 LAB — LIPID PANEL
Cholesterol: 128 mg/dL (ref 28–200)
HDL: 67 mg/dL
LDL Cholesterol: 50 mg/dL (ref 10–99)
NonHDL: 61.12
Total CHOL/HDL Ratio: 2
Triglycerides: 57 mg/dL (ref 10.0–149.0)
VLDL: 11.4 mg/dL (ref 0.0–40.0)

## 2024-02-04 LAB — TSH: TSH: 2.16 u[IU]/mL (ref 0.35–5.50)

## 2024-02-04 MED ORDER — POTASSIUM CHLORIDE CRYS ER 20 MEQ PO TBCR: 20.0000 meq | EXTENDED_RELEASE_TABLET | Freq: Every day | ORAL | 3 refills | Status: AC

## 2024-02-04 MED ORDER — FUROSEMIDE 20 MG PO TABS: 20.0000 mg | ORAL_TABLET | Freq: Two times a day (BID) | ORAL | 3 refills | Status: AC

## 2024-02-04 MED ORDER — LOSARTAN POTASSIUM 25 MG PO TABS: 25.0000 mg | ORAL_TABLET | Freq: Every day | ORAL | 3 refills | Status: AC

## 2024-02-04 MED ORDER — ROSUVASTATIN CALCIUM 5 MG PO TABS: ORAL_TABLET | ORAL | 3 refills | Status: AC

## 2024-02-04 MED ORDER — SERTRALINE HCL 100 MG PO TABS: 100.0000 mg | ORAL_TABLET | Freq: Every day | ORAL | 1 refills | Status: AC

## 2024-02-04 MED ORDER — ALPRAZOLAM 0.25 MG PO TABS: 0.2500 mg | ORAL_TABLET | Freq: Two times a day (BID) | ORAL | 2 refills | Status: AC | PRN

## 2024-02-04 NOTE — Patient Instructions (Signed)
 It was great seeing you today   We will follow up with you regarding your lab work   Please let me know if you need anything

## 2024-02-04 NOTE — Progress Notes (Signed)
 "  Subjective:    Patient ID: Taylor Mccall, female    DOB: 1945/07/16, 78 y.o.   MRN: [id number]  HPI Patient presents for yearly preventative medicine examination. She is a pleasant 78 year old female who  has a past medical history of Allergy, Anxiety, Arthritis, Blood transfusion without reported diagnosis, Breast mass, left, Cataract, Depression, Difficult intubation (04/01/2020), Gait disturbance, GERD (gastroesophageal reflux disease), Glaucoma, Hypertension, Lymphedema of both lower extremities, Obesity, Rheumatoid arthritis(714.0), Sleep apnea, Urinary incontinence, and Venous insufficiency.  Essential Hypertension -managed with Lasix  20 mg twice daily and Cozaar  25 mg daily.  She denies dizziness, lightheadedness, chest pain, or shortness of breath BP Readings from Last 3 Encounters:  02/04/24 110/80  12/19/23 (!) 144/76  11/27/23 109/72   Autoimmune disease-positive ANA, positive RA.  She is followed by rheumatology.  Currently managed with Plaquenil  200 mg twice daily.  She has not had any recent flares.  She does have chronic pain in bilateral hands and bilateral knees that is more consistent with osteoarthritis  OSA- she restarted her CPAP and although she finds the mask  strange she has found it helpful.   Depression and Anxiety-takes Zoloft  100 mg nightly and Taylor use Xanax  as needed. She reports that her brother died a few days before Christmas and that her depression has been worse since that time.     02/04/2024    1:05 PM 07/23/2023   12:10 PM 01/31/2023    1:10 PM  PHQ9 SCORE ONLY  PHQ-9 Total Score 18 8  0      Data saved with a previous flowsheet row definition    Lymphedema of bilateral legs -she has not been using her pumps. She would like to go back to PT for lymphedema.   Hyperlipidemia - managed with crestor  5 mg daily, she denies myalgia or fatigue. She has been out of the medication for the last few months  Lab Results  Component Value Date   CHOL  133 01/31/2023   HDL 64.30 01/31/2023   LDLCALC 42 01/31/2023   LDLDIRECT 129.7 04/11/2006   TRIG 135.0 01/31/2023   CHOLHDL 2 01/31/2023   All immunizations and health maintenance protocols were reviewed with the patient and needed orders were placed. She is up to date on her routine vaccinations.   Appropriate screening laboratory values were ordered for the patient including screening of hyperlipidemia, renal function and hepatic function.  Medication reconciliation,  past medical history, social history, problem list and allergies were reviewed in detail with the patient  Goals were established with regard to weight loss, exercise, and  diet in compliance with medications. She is not exercising much due to lymphedema.  Wt Readings from Last 3 Encounters:  02/04/24 270 lb (122.5 kg)  12/19/23 265 lb 6.4 oz (120.4 kg)  11/27/23 263 lb (119.3 kg)    Review of Systems  Constitutional: Negative.   HENT: Negative.    Eyes: Negative.   Respiratory: Negative.    Cardiovascular:  Positive for leg swelling.  Gastrointestinal: Negative.   Endocrine: Negative.   Genitourinary: Negative.   Musculoskeletal: Negative.   Skin: Negative.   Allergic/Immunologic: Negative.   Neurological: Negative.   Hematological: Negative.   Psychiatric/Behavioral: Negative.     Past Medical History:  Diagnosis Date   Allergy    Anxiety    Arthritis    Blood transfusion without reported diagnosis    Breast mass, left    Cataract    Depression    Difficult intubation  04/01/2020   Known difficult airway from previous records   Gait disturbance    GERD (gastroesophageal reflux disease)    Glaucoma    Hypertension    Lymphedema of both lower extremities    Seeing OT at [facility] - dreeeing to Left Lower leg   Obesity    Rheumatoid arthritis(714.0)    Sleep apnea    cpap   Urinary incontinence    Venous insufficiency     Social History   Socioeconomic History   Marital  status: Widowed    Spouse name: Not on file   Number of children: Not on file   Years of education: Not on file   Highest education level: 12th grade  Occupational History   Not on file  Tobacco Use   Smoking status: Former    Current packs/day: 0.00    Average packs/day: 0.3 packs/day for 10.0 years (3.0 ttl pk-yrs)    Types: Cigarettes    Start date: 02/06/1968    Quit date: 02/05/1978    Years since quitting: 46.0    Passive exposure: Past   Smokeless tobacco: Never   Tobacco comments:    over 25 years ago...pt doesnt remember when she quit.   Vaping Use   Vaping status: Never Used  Substance and Sexual Activity   Alcohol  use: No   Drug use: No   Sexual activity: Not on file  Other Topics Concern   Not on file  Social History Narrative   Not on file   Social Drivers of Health   Tobacco Use: Medium Risk (02/04/2024)   Patient History    Smoking Tobacco Use: Former    Smokeless Tobacco Use: Never    Passive Exposure: Past  Physicist, Medical Strain: Low Risk (04/24/2021)   Overall Financial Resource Strain (CARDIA)    Difficulty of Paying Living Expenses: Not hard at all  Food Insecurity: No Food Insecurity (03/19/2022)   Hunger Vital Sign    Worried About Running Out of Food in the Last Year: Never true    Ran Out of Food in the Last Year: Never true  Transportation Needs: No Transportation Needs (03/13/2023)   Received from [facility]   PRAPARE - Transportation    Lack of Transportation (Medical): No    Lack of Transportation (Non-Medical): No  Physical Activity: Unknown (03/19/2022)   Exercise Vital Sign    Days of Exercise per Week: 0 days    Minutes of Exercise per Session: Not on file  Recent Concern: Physical Activity - Inactive (03/19/2022)   Exercise Vital Sign    Days of Exercise per Week: 0 days    Minutes of Exercise per Session: 30 min  Stress: No Stress Concern Present (03/13/2023)   Received from [facility]   [employer] - Occupational Stress Questionnaire    Feeling of Stress : Only a little  Social Connections: Unknown (03/27/2023)   Received from [facility]   Social Connections    In a typical week, how many times do you talk on the phone with family, friends, or neighbors?: More than three times a week    How often do you get together with friends or relatives?: More than three times a week    Attends Religious Services: Not on file    Do you belong to any clubs or organizations such as church groups, unions, fraternal or athletic groups, or school groups?: No    How  often do you attend meetings of the clubs or organizations you belong to?: Never    Marital Status: Not on file  Intimate Partner Violence: Not on file  Depression (PHQ2-9): High Risk (02/04/2024)   Depression (PHQ2-9)    PHQ-2 Score: 18  Alcohol  Screen: Not on file  Housing: Low Risk (03/19/2022)   Housing    Last Housing Risk Score: 0  Utilities: Not on file  Health Literacy: Adequate Health Literacy (03/13/2023)   Received from [facility]   B1300 Health Literacy    Frequency of need for help with medical instructions: Never    Past Surgical History:  Procedure Laterality Date   ABDOMINAL HYSTERECTOMY     with vesicovaginal fistula closure   ANTERIOR CERVICAL DECOMP/DISCECTOMY FUSION N/A 06/11/2022   Procedure: ANTERIOR CERVICAL DISCECTOMY FUSION C3-4, C5-6 WITH ALLOGRAFT, PLATE;  Surgeon: Barbarann Oneil BROCKS, MD;  Location: [facility] OR;  Service: Orthopedics;  Laterality: N/A;   ANTERIOR HIP REVISION Left 04/01/2020   Procedure: ANTERIOR LEFT HIP REVISION OF ACETABULAR COMPONENT;  Surgeon: Vernetta Lonni GRADE, MD;  Location: [facility] ORS;  Service: Orthopedics;  Laterality: Left;  3E   BLADDER SUSPENSION  2009   sling   BREAST REDUCTION SURGERY     BREAST SURGERY     COLONOSCOPY     ESOPHAGOGASTRODUODENOSCOPY     EYE SURGERY     JOINT REPLACEMENT     REFRACTIVE SURGERY Right  06/26/2021   REPLACEMENT TOTAL KNEE Left    TOE SURGERY Left    Left great toe   toenail removal Right    all 5 toenails   TOTAL HIP ARTHROPLASTY     TOTAL KNEE ARTHROPLASTY Right 12/09/2013   Procedure: Right Total Knee Arthroplasty;  Surgeon: Jerona Harden GAILS, MD;  Location: [facility] OR;  Service: Orthopedics;  Laterality: Right;   TOTAL KNEE REVISION Left 11/25/2020   Procedure: LEFT TOTAL KNEE REVISION;  Surgeon: Vernetta Lonni GRADE, MD;  Location: [facility] ORS;  Service: Orthopedics;  Laterality: Left;   TUBAL LIGATION      Family History  Problem Relation Age of Onset   Cancer Mother    Coronary artery disease Father    Skin cancer Father    Heart disease Father    Cancer Father    Diabetes Father    Stroke Father    Heart disease Sister    Arthritis Sister    Heart disease Sister    Asthma Brother    Heart disease Brother    Diabetes Brother    Coronary artery disease Brother    Cancer Brother    Diabetes Other    Stroke Other    Colon cancer Neg Hx    Esophageal cancer Neg Hx    Rectal cancer Neg Hx    Stomach cancer Neg Hx     Allergies[1]  Medications Ordered Prior to Encounter[2]  BP 110/80   Pulse 79   Temp 98 F (36.7 C) (Oral)   Ht 5' 10 (1.778 m)   Wt 270 lb (122.5 kg)   SpO2 96%   BMI 38.74 kg/m       Objective:   Physical Exam Vitals and nursing note reviewed.  Constitutional:      General: She is not in acute distress.    Appearance: Normal appearance. She is obese. She is not ill-appearing.  HENT:     Head: Normocephalic and atraumatic.     Right Ear: Tympanic membrane, ear canal and external ear normal. There is  no impacted cerumen.     Left Ear: Tympanic membrane, ear canal and external ear normal. There is no impacted cerumen.     Nose: Nose normal. No congestion or rhinorrhea.     Mouth/Throat:     Mouth: Mucous membranes are moist.     Pharynx: Oropharynx is clear.  Eyes:     Extraocular Movements: Extraocular movements intact.      Conjunctiva/sclera: Conjunctivae normal.     Pupils: Pupils are equal, round, and reactive to light.  Neck:     Vascular: No carotid bruit.  Cardiovascular:     Rate and Rhythm: Normal rate and regular rhythm.     Pulses: Normal pulses.     Heart sounds: No murmur heard.    No friction rub. No gallop.  Pulmonary:     Effort: Pulmonary effort is normal.     Breath sounds: Normal breath sounds.  Abdominal:     General: Abdomen is flat. Bowel sounds are normal. There is no distension.     Palpations: Abdomen is soft. There is no mass.     Tenderness: There is no abdominal tenderness. There is no guarding or rebound.     Hernia: No hernia is present.  Musculoskeletal:        General: Normal range of motion.     Cervical back: Normal range of motion and neck supple.     Right lower leg: Edema present.     Left lower leg: Edema present.  Lymphadenopathy:     Cervical: No cervical adenopathy.  Skin:    General: Skin is warm and dry.     Capillary Refill: Capillary refill takes less than 2 seconds.  Neurological:     General: No focal deficit present.     Mental Status: She is alert and oriented to person, place, and time.  Psychiatric:        Mood and Affect: Mood normal.        Behavior: Behavior normal.        Thought Content: Thought content normal.        Judgment: Judgment normal.        Assessment & Plan:   1. Routine general medical examination at a health care facility (Primary) Today patient counseled on age appropriate routine health concerns for screening and prevention, each reviewed and up to date or declined. Immunizations reviewed and up to date or declined. Labs ordered and reviewed. Risk factors for depression reviewed and negative. Hearing function and visual acuity are intact. ADLs screened and addressed as needed. Functional ability and level of safety reviewed and appropriate. Education, counseling and referrals performed based on assessed risks today. Patient  provided with a copy of personalized plan for preventive services.   2. Essential hypertension - Controlled. No change in medication  - furosemide  (LASIX ) 20 MG tablet; Take 1 tablet (20 mg total) by mouth 2 (two) times daily.  Dispense: 180 tablet; Refill: 3 - potassium chloride  SA (KLOR-CON  M20) 20 MEQ tablet; Take 1 tablet (20 mEq total) by mouth daily.  Dispense: 90 tablet; Refill: 3 - losartan  (COZAAR ) 25 MG tablet; Take 1 tablet (25 mg total) by mouth daily.  Dispense: 90 tablet; Refill: 3 - CBC with Differential/Platelet; Future - Comprehensive metabolic panel with GFR; Future - Lipid panel; Future - TSH; Future - TSH - Lipid panel - Comprehensive metabolic panel with GFR - CBC with Differential/Platelet  3. Autoimmune disease - Per rheumatology  - CBC with Differential/Platelet; Future - Comprehensive metabolic  panel with GFR; Future - Lipid panel; Future - TSH; Future - TSH - Lipid panel - Comprehensive metabolic panel with GFR - CBC with Differential/Platelet  4. OSA (obstructive sleep apnea) - Continue with CPAP - CBC with Differential/Platelet; Future - Comprehensive metabolic panel with GFR; Future - Lipid panel; Future - TSH; Future - TSH - Lipid panel - Comprehensive metabolic panel with GFR - CBC with Differential/Platelet  5. Anxiety  - ALPRAZolam  (XANAX ) 0.25 MG tablet; Take 1 tablet (0.25 mg total) by mouth 2 (two) times daily as needed for anxiety.  Dispense: 20 tablet; Refill: 2 - sertraline  (ZOLOFT ) 100 MG tablet; Take 1 tablet (100 mg total) by mouth daily.  Dispense: 90 tablet; Refill: 1 - CBC with Differential/Platelet; Future - Comprehensive metabolic panel with GFR; Future - Lipid panel; Future - TSH; Future - TSH - Lipid panel - Comprehensive metabolic panel with GFR - CBC with Differential/Platelet  6. Moderate episode of recurrent major depressive disorder (HCC) - Taylor keep her on same dose of medication for the time being. Follow up  if depression does not improve  - sertraline  (ZOLOFT ) 100 MG tablet; Take 1 tablet (100 mg total) by mouth daily.  Dispense: 90 tablet; Refill: 1 - CBC with Differential/Platelet; Future - Comprehensive metabolic panel with GFR; Future - Lipid panel; Future - TSH; Future - TSH - Lipid panel - Comprehensive metabolic panel with GFR - CBC with Differential/Platelet  7. Lymphedema -Taylor refer to [facility] PT  - CBC with Differential/Platelet; Future - Comprehensive metabolic panel with GFR; Future - Lipid panel; Future - TSH; Future - Ambulatory referral to Physical Therapy - TSH - Lipid panel - Comprehensive metabolic panel with GFR - CBC with Differential/Platelet  8. Hyperlipidemia, unspecified hyperlipidemia type - Continue with Crestor   - rosuvastatin  (CRESTOR ) 5 MG tablet; TAKE 1 TABLET BY MOUTH EVERYDAY AT BEDTIME  Dispense: 90 tablet; Refill: 3 - CBC with Differential/Platelet; Future - Comprehensive metabolic panel with GFR; Future - Lipid panel; Future - TSH; Future - TSH - Lipid panel - Comprehensive metabolic panel with GFR - CBC with Differential/Platelet   Darleene Shape, NP     [1]  Allergies Allergen Reactions   Penicillins Rash  [2]  Current Outpatient Medications on File Prior to Visit  Medication Sig Dispense Refill   Acetaminophen  (TYLENOL  PO) Take by mouth as needed.     ALPRAZolam  (XANAX ) 0.25 MG tablet Take 1 tablet (0.25 mg total) by mouth 2 (two) times daily as needed for anxiety. 20 tablet 2   aspirin  EC 81 MG tablet Take 1 tablet (81 mg total) by mouth 2 (two) times daily. Swallow whole. (Patient taking differently: Take 81 mg by mouth daily. Swallow whole.) 30 tablet 11   brimonidine  (ALPHAGAN ) 0.2 % ophthalmic solution Place 1 drop into both eyes 2 (two) times daily.     Cholecalciferol  (VITAMIN D3) 125 MCG (5000 UT) CHEW Chew 10,000 Units by mouth daily.     Cyanocobalamin  (VITAMIN B-12 PO) Take 6,000 mcg by mouth daily. Vitamin B-12  3000 mcg chew     furosemide  (LASIX ) 20 MG tablet Take 1 tablet (20 mg total) by mouth 2 (two) times daily. 180 tablet 3   hydroxychloroquine  (PLAQUENIL ) 200 MG tablet Take 1 tablet (200 mg total) by mouth 2 (two) times daily. 180 tablet 0   KLOR-CON  M20 20 MEQ tablet TAKE 1 TABLET BY MOUTH DAILY 90 tablet 1   latanoprost  (XALATAN ) 0.005 % ophthalmic solution Place 1 drop into both eyes at bedtime.  losartan  (COZAAR ) 25 MG tablet TAKE 1 TABLET (25 MG TOTAL) BY MOUTH DAILY. KEEP APPT FOR REFILLS 90 tablet 0   Multiple Vitamins-Minerals (MULTIVITAMIN GUMMIES WOMENS) CHEW Chew 2 each by mouth daily.     Omega-3 Fatty Acids (FISH OIL) 1200 MG CAPS Take 2,400 mg by mouth daily.     rosuvastatin  (CRESTOR ) 5 MG tablet TAKE 1 TABLET BY MOUTH EVERYDAY AT BEDTIME 90 tablet 0   sertraline  (ZOLOFT ) 100 MG tablet TAKE 1 TABLET BY MOUTH EVERY DAY IN THE MORNING 90 tablet 0   timolol  (TIMOPTIC ) 0.5 % ophthalmic solution Place 1 drop into both eyes 2 (two) times daily.     vitamin E  180 MG (400 UNITS) capsule Take 400 Units by mouth daily.     No current facility-administered medications on file prior to visit.   "

## 2024-02-10 ENCOUNTER — Ambulatory Visit: Admitting: Adult Health

## 2024-02-21 ENCOUNTER — Telehealth: Admitting: Adult Health

## 2024-02-21 ENCOUNTER — Encounter: Payer: Self-pay | Admitting: Adult Health

## 2024-02-21 DIAGNOSIS — E669 Obesity, unspecified: Secondary | ICD-10-CM

## 2024-02-21 DIAGNOSIS — G4733 Obstructive sleep apnea (adult) (pediatric): Secondary | ICD-10-CM

## 2024-02-21 NOTE — Patient Instructions (Signed)
 Continue CPAP At bedtime, wear all night long.  Work on healthy weight loss.  CPAP pressure adjustments were made May like to try the ResMed F30 I or DreamWear fullface may be more comfortable option Follow up in 4 months- Friday Virtual clinic.

## 2024-02-21 NOTE — Progress Notes (Signed)
 Virtual Visit via Video Note  I connected with Taylor Mccall on 02/21/24 at  2:30 PM EST by a video enabled telemedicine application and verified that I am speaking with the correct person using two identifiers.  Location: Patient: Home  Provider: Office    I discussed the limitations of evaluation and management by telemedicine and the availability of in person appointments. The patient expressed understanding and agreed to proceed.  History of Present Illness: 79 year old female seen for sleep consult September 2025 to reestablish for sleep apnea Medical history significant for rheumatoid arthritis and lymphedema  Today's video visit is a follow-up for sleep apnea.  Patient reestablished in September for her sleep apnea.  A home sleep study was done on October 2025 that showed mild obstructive sleep apnea.  Patient had significant symptom burden was started back on CPAP.  Patient says she is doing very well on CPAP.  Wears it every single night.  She does feel that she is benefiting and feels more rested while using CPAP.  She has been having some difficulty with mask issues irritating her nose.  She recently switched to a modified fullface mask but says it was not very comfortable because it had nasal pillows.  She has switched back to the fullface but continues to have irritation along the bridge of her nose.  We have discussed her reaching back out to her homecare company for an alternative fit.  CPAP download shows excellent compliance with daily average usage at 6.5 hours.  She is on auto CPAP 5 to 15 cm H2O.  AHI is 6.9/hour, daily average pressure of 14 cm H2O.  Patient says pressure feels comfortable.   Observations/Objective: NPSG 2009:  AHI 21/hr.  Auto 2014:  Optimal pressure 15cm  home sleep study that was done on November 27, 2023 that showed AHI 10.5/hour. SpO2 low at 80%.   Assessment and Plan: Mild obstructive sleep apnea with significant symptom burden.  Patient says she  is doing well on CPAP and feels that she is benefiting.  Taylor adjust CPAP pressure in hopes to decrease residual sleep apnea episodes.  Changed to auto CPAP 10 to 15 cm H2O.  Patient is advised to reach out to her homecare company [employer] regarding different mask options.  Would recommend a modified fullface mask such as ResMed F 30I or DreamWear fullface.  CPAP care discussed in detail  Obesity-continue with healthy weight loss  Plan  Patient Instructions  Continue CPAP At bedtime, wear all night long.  Work on healthy weight loss.  CPAP pressure adjustments were made May like to try the ResMed F30 I or DreamWear fullface may be more comfortable option Follow up in 4 months- Friday Virtual clinic.     Follow Up Instructions:    I discussed the assessment and treatment plan with the patient. The patient was provided an opportunity to ask questions and all were answered. The patient agreed with the plan and demonstrated an understanding of the instructions.   The patient was advised to call back or seek an in-person evaluation if the symptoms worsen or if the condition fails to improve as anticipated.  I provided 22  minutes of non-face-to-face time during this encounter.   Madelin Stank, NP

## 2024-05-05 ENCOUNTER — Ambulatory Visit: Admitting: Rheumatology
